# Patient Record
Sex: Male | Born: 1965 | Race: White | Hispanic: No | Smoking: Former smoker
Health system: Southern US, Community
[De-identification: ages and names within clinical notes are randomized; demographics above are authoritative.]

## PROBLEM LIST (undated history)

## (undated) DIAGNOSIS — F419 Anxiety disorder, unspecified: Secondary | ICD-10-CM

## (undated) DIAGNOSIS — E119 Type 2 diabetes mellitus without complications: Secondary | ICD-10-CM

## (undated) DIAGNOSIS — I739 Peripheral vascular disease, unspecified: Secondary | ICD-10-CM

## (undated) DIAGNOSIS — I2510 Atherosclerotic heart disease of native coronary artery without angina pectoris: Secondary | ICD-10-CM

## (undated) DIAGNOSIS — E785 Hyperlipidemia, unspecified: Secondary | ICD-10-CM

## (undated) DIAGNOSIS — M1990 Unspecified osteoarthritis, unspecified site: Secondary | ICD-10-CM

## (undated) DIAGNOSIS — G4730 Sleep apnea, unspecified: Secondary | ICD-10-CM

## (undated) DIAGNOSIS — I209 Angina pectoris, unspecified: Secondary | ICD-10-CM

## (undated) DIAGNOSIS — M25512 Pain in left shoulder: Secondary | ICD-10-CM

## (undated) DIAGNOSIS — N183 Chronic kidney disease, stage 3 (moderate): Secondary | ICD-10-CM

## (undated) DIAGNOSIS — J449 Chronic obstructive pulmonary disease, unspecified: Secondary | ICD-10-CM

## (undated) DIAGNOSIS — I200 Unstable angina: Secondary | ICD-10-CM

## (undated) DIAGNOSIS — R0602 Shortness of breath: Secondary | ICD-10-CM

## (undated) DIAGNOSIS — J45909 Unspecified asthma, uncomplicated: Secondary | ICD-10-CM

## (undated) DIAGNOSIS — M069 Rheumatoid arthritis, unspecified: Secondary | ICD-10-CM

## (undated) HISTORY — DX: Peripheral vascular disease, unspecified: I73.9

## (undated) HISTORY — DX: Hyperlipidemia, unspecified: E78.5

## (undated) HISTORY — PX: CATARACT EXTRACTION, BILATERAL: SHX1313

## (undated) HISTORY — DX: Pain in left shoulder: M25.512

## (undated) HISTORY — PX: EYE SURGERY: SHX253

## (undated) HISTORY — PX: CARPAL TUNNEL RELEASE: SHX101

---

## 2000-01-17 ENCOUNTER — Encounter: Admission: RE | Admit: 2000-01-17 | Discharge: 2000-04-16 | Payer: Self-pay | Admitting: Internal Medicine

## 2002-08-11 ENCOUNTER — Encounter: Payer: Self-pay | Admitting: Internal Medicine

## 2002-08-11 ENCOUNTER — Encounter: Admission: RE | Admit: 2002-08-11 | Discharge: 2002-08-11 | Payer: Self-pay | Admitting: Internal Medicine

## 2004-07-11 ENCOUNTER — Emergency Department (HOSPITAL_COMMUNITY): Admission: EM | Admit: 2004-07-11 | Discharge: 2004-07-11 | Payer: Self-pay | Admitting: Emergency Medicine

## 2007-05-26 ENCOUNTER — Ambulatory Visit (HOSPITAL_BASED_OUTPATIENT_CLINIC_OR_DEPARTMENT_OTHER): Admission: RE | Admit: 2007-05-26 | Discharge: 2007-05-26 | Payer: Self-pay | Admitting: Orthopedic Surgery

## 2007-07-22 ENCOUNTER — Encounter (INDEPENDENT_AMBULATORY_CARE_PROVIDER_SITE_OTHER): Payer: Self-pay | Admitting: Orthopedic Surgery

## 2007-07-22 ENCOUNTER — Ambulatory Visit (HOSPITAL_BASED_OUTPATIENT_CLINIC_OR_DEPARTMENT_OTHER): Admission: RE | Admit: 2007-07-22 | Discharge: 2007-07-22 | Payer: Self-pay | Admitting: Orthopedic Surgery

## 2008-08-10 ENCOUNTER — Encounter: Admission: RE | Admit: 2008-08-10 | Discharge: 2008-08-10 | Payer: Self-pay | Admitting: Internal Medicine

## 2009-06-04 DIAGNOSIS — E785 Hyperlipidemia, unspecified: Secondary | ICD-10-CM | POA: Insufficient documentation

## 2010-12-04 DIAGNOSIS — I10 Essential (primary) hypertension: Secondary | ICD-10-CM | POA: Insufficient documentation

## 2010-12-17 NOTE — Op Note (Signed)
[name], [name]              ACCOUNT NO.:  [id number]   MEDICAL RECORD NO.:  [id number]          PATIENT TYPE:  AMB   LOCATION:  DSC                          FACILITY:  [facility]   PHYSICIAN:  Cindee Salt, M.D.       DATE OF BIRTH:  [birth year]   DATE OF PROCEDURE:  05/26/2007  DATE OF DISCHARGE:  05/26/2007                               OPERATIVE REPORT   PREOPERATIVE DIAGNOSES:  Carpal tunnel syndrome and ulnar neuropathy,  left hand and left elbow.   POSTOPERATIVE DIAGNOSES:  Carpal tunnel syndrome and ulnar neuropathy,  left hand and left elbow.   OPERATION:  1. Decompression left median nerve.  2. Subcutaneous transposition of left ulnar artery at the elbow.   SURGEON:  Cindee Salt, M.D.   ASSISTANT:  __________ R.N.   ANESTHESIA:  General.   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  This has not  responded to conservative treatment.  He has elected to undergo surgical  decompression of this.  He has a history of diabetes and probable  peripheral neuropathy.  He is aware of risks and complications including  the possibility of infection, recurrence, injury to arteries, nerves,  tendons, incomplete relief of symptoms and dystrophy.  He is aware that  this may not entirely resolve all of his symptoms secondary to the  underlying peripheral neuropathy.  His questions have been encouraged  and answered.  In the preoperative area, the patient is seen, the  extremity marked by both patient and surgeon, questions again encouraged  and answered for him and his family.  Antibiotic given.   PROCEDURE:  The patient is brought to the operating room where a general  anesthetic was carried out without difficulty.  He was prepped using  DuraPrep in supine position, left arm free.  A longitudinal incision was  made in the palm,  carried down through subcutaneous tissue.  Bleeders  were electrocauterized.  Palmar fascia was split.  Superficial palmar  arch identified.  Flexor tendon of the ring and little finger identified  to the ulnar side of the median nerve.  The carpal retinaculum was  incised with sharp dissection.  Significant scarring and thickening of  the palmar fascia was immediately apparent as was the carpal  retinaculum.  A right-angle and Sewall retractor were placed between  skin and forearm, and the forearm fascia released for approximately 2 cm  proximal to the wrist crease under direct vision.  The canal was  explored.  Area of compression of the nerve was apparent.  No further  lesions were identified.  The wound was irrigated and closed with  interrupted 5-0 Vicryl Rapide sutures.   A separate incision was then made over the medial aspect of the elbow,  carried down through subcutaneous tissue.  Bleeders again  electrocauterized.  The posterior branch of the medial antebrachial  cutaneous nerve of the forearm was identified and protected.  Dissection  was carried down to the ulnar  nerve.  Significant scarring was present  and fibrosis about the nerve was apparent.  This was dissected free and  neurolysis performed.  A fasciotomy of the flexor carpi ulnaris  performed.  The dissection carried up to the arcade of Struthers.  The  elbow was placed into full flexion, and the elbow was noted to have the  ulnar nerve immediately dislocate around the medial epicondyle.  It was  decided to proceed with a subcutaneous transposition.  The medial  intermuscular septum was then harvested leaving an attached posterior  aspect of the epicondyle.  The fascia of the flexor carpi ulnaris was  then elevated to provide a secondary flap distally.  The nerve was then  anteriorly transposed along with its blood vessels.  Two slings were  then created and sutured into position with interrupted 4-0 Vicryl  sutures  after irrigation.  The arm was placed through full flexion, full  extension.  No further subluxation was noted.  The subcutaneous tissue  was then closed with interrupted 4-0 Vicryl sutures and the skin with  interrupted 4-0 Vicryl Rapide sutures.  A sterile compressive dressing  and long-arm splint with the elbow in 90 degrees of flexion was applied.  On deflation of the tourniquet, all fingers immediately pinked.  He was  taken to the recovery room for observation in satisfactory condition.   He was discharged home to return to the [facility] of [place] in 1  week on Vicodin.           ______________________________  Cindee Salt, M.D.     GK/MEDQ  D:  05/26/2007  T:  05/27/2007  Job:  [id number]   cc:   Cindee Salt, M.D.  Ivery Quale, M.D.

## 2010-12-17 NOTE — Op Note (Signed)
NAMEBRADAN, [name]              ACCOUNT NO.:  [id number]   MEDICAL RECORD NO.:  [id number]          PATIENT TYPE:  AMB   LOCATION:  DSC                          FACILITY:  [facility]   PHYSICIAN:  Cindee Salt, M.D.       DATE OF BIRTH:  [birth year]   DATE OF PROCEDURE:  07/22/2007  DATE OF DISCHARGE:                               OPERATIVE REPORT   PREOPERATIVE DIAGNOSIS:  Carpal tunnel syndrome, ulnar neuropathy, right  wrist and elbow.   POSTOPERATIVE DIAGNOSIS:  Carpal tunnel syndrome, ulnar neuropathy,  right wrist and elbow.   OPERATION:  Decompression median nerve, subcutaneous transposition ulnar  nerve, right elbow, right hand.   SURGEON:  Merlyn Lot, M.D.   ANESTHESIA:  General.   HISTORY:  The patient is a 45 year old male with a history of diabetes,  median and ulnar neuropathy at the wrist and elbow.  He has undergone  release on his left side, is admitted now for release of his right with  possible transposition.  He is aware of risks and complications  including infection, recurrence, injury to arteries, nerves, tendons,  incomplete relief of symptoms, dystrophy.  Questions have been  encouraged and answered.   In the preoperative area, the patient was seen, the extremity marked by  both the patient and surgeon.  Antibiotic given.   PROCEDURE:  The patient was brought to the operating room where a  general anesthetic LMA was given without difficulty.  Was prepped using  DuraPrep, supine position, right arm free.  The limb was exsanguinated  with an Esmarch bandage.  Tourniquet placed high on the arm was inflated  to 150 mmHg.  Straight incision was made in the palm, carried down  through subcutaneous tissue.  Bleeders were electrocauterized.  Palmar  fascia was split.  Superficial palmar arch identified.  The median nerve  was identified.  A thickening of the palmar fascia was noted, indicative  of what appeared to be either a fibroma or Dupuytren's contracture.  This  was sent to pathology.  The median nerve was released at the ulnar  side of the carpal retinaculum.  Right angle and Sewall retractor were  placed between skin and forearm fascia.  Forearm fascia was released  approximately a centimeter and a half proximal to the wrist crease under  direct vision.  Canal was explored.  Thickening of the tenosynovium  tissue was noted.  No further lesions were identified.  The wound was  irrigated and closed interrupted 5-0 Vicryl Rapide sutures.  A separate  stab incision was then made over the medial epicondyle of the right  elbow, carried down through subcutaneous tissue.  Bleeders again  electrocauterized.  Dissection carried down to the medial musculature  epicondyle.  The posterior branch of the medial antebrachial cutaneous  nerve of the forearm identified and protected.  The dissection was then  carried down to the ulnar nerve.  This was identified proximally and  distally.  Significant scarring was present about this with thickening  of the epineurium.  A fasciotomy of the flexor carpi ulnaris was then  performed.  The medial intermuscular  septum was then dissected free up  to the level of the arcade of Struthers.  The medial intermuscular  septum was left attached distally to the most medial aspect of the  epicondyle.  A portion of fascia of the origin of the flexor carpi  ulnaris was then elevated.  This was also left attached to the medial  epicondyle.  The ulnar nerve was then anteriorly transposed along with  its __________ anteriorly.  The two portions of medial intermuscular  septum and the fascia of the flexor carpi ulnaris were then used to  create two slings.  These were then sutured to the subcutaneous tissue  with figure-of-eight 2-0 Vicryl sutures after irrigation.  The arm was  placed through a full flexion, full extension.  No subluxation was  noted.  It was found prior to the transposition that the ulnar nerve did  subluxate  anteriorly following decompression.  The wound was again  irrigated.  The subcutaneous tissue closed with interrupted 2-0 Vicryl  and skin with interrupted 5-0 Vicryl Rapide sutures.  Sterile  compressive dressing and long-arm splint applied.  The patient tolerated  the procedure well and was taken to the recovery room for observation in  satisfactory condition.  He will be discharged home to return to the  [facility] of [place] in 1 week on Vicodin.           ______________________________  Cindee Salt, M.D.     GK/MEDQ  D:  07/22/2007  T:  07/23/2007  Job:  [id number]   cc:   Cindee Salt, M.D.

## 2011-05-09 LAB — BASIC METABOLIC PANEL
BUN: 16
CO2: 29
Calcium: 9.3
Chloride: 103
Creatinine, Ser: 1.58 — ABNORMAL HIGH
GFR calc Af Amer: 58 — ABNORMAL LOW
GFR calc non Af Amer: 48 — ABNORMAL LOW
Glucose, Bld: 48 — ABNORMAL LOW
Potassium: 4.6
Sodium: 139

## 2011-05-09 LAB — POCT HEMOGLOBIN-HEMACUE
Hemoglobin: 15.3
Operator id: 128471

## 2011-05-14 LAB — BASIC METABOLIC PANEL
BUN: 13
CO2: 29
Calcium: 9.3
Chloride: 102
Creatinine, Ser: 1.5
GFR calc Af Amer: >60
GFR calc non Af Amer: 51 — ABNORMAL LOW
Glucose, Bld: 101 — ABNORMAL HIGH
Potassium: 4.1
Sodium: 137

## 2011-05-14 LAB — POCT HEMOGLOBIN-HEMACUE
Hemoglobin: 14.3
Operator id: 116011

## 2011-06-16 DIAGNOSIS — H4310 Vitreous hemorrhage, unspecified eye: Secondary | ICD-10-CM | POA: Insufficient documentation

## 2011-12-19 DIAGNOSIS — H2513 Age-related nuclear cataract, bilateral: Secondary | ICD-10-CM | POA: Insufficient documentation

## 2012-12-13 ENCOUNTER — Encounter (INDEPENDENT_AMBULATORY_CARE_PROVIDER_SITE_OTHER): Payer: 59 | Admitting: *Deleted

## 2012-12-13 DIAGNOSIS — M79609 Pain in unspecified limb: Secondary | ICD-10-CM

## 2013-03-18 ENCOUNTER — Encounter (HOSPITAL_COMMUNITY): Payer: Self-pay | Admitting: Emergency Medicine

## 2013-03-18 ENCOUNTER — Emergency Department (HOSPITAL_COMMUNITY)
Admission: EM | Admit: 2013-03-18 | Discharge: 2013-03-18 | Disposition: A | Discharge status: Home / Self Care | Payer: Worker's Compensation | Attending: Emergency Medicine | Admitting: Emergency Medicine

## 2013-03-18 DIAGNOSIS — Z7982 Long term (current) use of aspirin: Secondary | ICD-10-CM | POA: Insufficient documentation

## 2013-03-18 DIAGNOSIS — F17200 Nicotine dependence, unspecified, uncomplicated: Secondary | ICD-10-CM | POA: Insufficient documentation

## 2013-03-18 DIAGNOSIS — E119 Type 2 diabetes mellitus without complications: Secondary | ICD-10-CM | POA: Insufficient documentation

## 2013-03-18 DIAGNOSIS — Y939 Activity, unspecified: Secondary | ICD-10-CM | POA: Insufficient documentation

## 2013-03-18 DIAGNOSIS — IMO0002 Reserved for concepts with insufficient information to code with codable children: Secondary | ICD-10-CM | POA: Insufficient documentation

## 2013-03-18 DIAGNOSIS — Z79899 Other long term (current) drug therapy: Secondary | ICD-10-CM | POA: Insufficient documentation

## 2013-03-18 DIAGNOSIS — Z794 Long term (current) use of insulin: Secondary | ICD-10-CM | POA: Insufficient documentation

## 2013-03-18 DIAGNOSIS — S0993XA Unspecified injury of face, initial encounter: Secondary | ICD-10-CM | POA: Insufficient documentation

## 2013-03-18 DIAGNOSIS — Y92410 Unspecified street and highway as the place of occurrence of the external cause: Secondary | ICD-10-CM | POA: Insufficient documentation

## 2013-03-18 HISTORY — DX: Type 2 diabetes mellitus without complications: E11.9

## 2013-03-18 MED ORDER — CYCLOBENZAPRINE HCL 10 MG PO TABS: 10.0000 mg | ORAL_TABLET | Freq: Two times a day (BID) | ORAL | Status: DC | PRN

## 2013-03-18 MED ORDER — HYDROCODONE-ACETAMINOPHEN 5-325 MG PO TABS: 1.0000 | ORAL_TABLET | ORAL | Status: DC | PRN
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
## 2013-03-18 NOTE — ED Provider Notes (Signed)
Medical screening examination/treatment/procedure(s) were performed by non-physician practitioner and as supervising physician I was immediately available for consultation/collaboration.  Raeford Razor, MD 03/18/13 1600
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
## 2013-04-19 DIAGNOSIS — J45909 Unspecified asthma, uncomplicated: Secondary | ICD-10-CM | POA: Insufficient documentation

## 2013-12-19 DIAGNOSIS — L709 Acne, unspecified: Secondary | ICD-10-CM | POA: Insufficient documentation

## 2013-12-19 DIAGNOSIS — Z79899 Other long term (current) drug therapy: Secondary | ICD-10-CM | POA: Insufficient documentation

## 2014-01-04 DIAGNOSIS — N529 Male erectile dysfunction, unspecified: Secondary | ICD-10-CM | POA: Insufficient documentation

## 2014-04-07 DIAGNOSIS — R072 Precordial pain: Secondary | ICD-10-CM | POA: Insufficient documentation

## 2014-04-25 ENCOUNTER — Encounter (HOSPITAL_COMMUNITY)
Admission: AD | Disposition: A | Discharge status: Home / Self Care | Payer: Self-pay | Source: Ambulatory Visit | Attending: Cardiothoracic Surgery

## 2014-04-25 ENCOUNTER — Ambulatory Visit (HOSPITAL_COMMUNITY): Payer: 59

## 2014-04-25 ENCOUNTER — Inpatient Hospital Stay (HOSPITAL_COMMUNITY): Payer: 59

## 2014-04-25 ENCOUNTER — Inpatient Hospital Stay (HOSPITAL_COMMUNITY)
Admission: AD | Admit: 2014-04-25 | Discharge: 2014-05-06 | DRG: 234 | Disposition: A | Discharge status: Home / Self Care | Payer: 59 | Source: Ambulatory Visit | Attending: Cardiothoracic Surgery | Admitting: Cardiothoracic Surgery

## 2014-04-25 ENCOUNTER — Other Ambulatory Visit: Payer: Self-pay

## 2014-04-25 DIAGNOSIS — I2582 Chronic total occlusion of coronary artery: Secondary | ICD-10-CM | POA: Diagnosis present

## 2014-04-25 DIAGNOSIS — E785 Hyperlipidemia, unspecified: Secondary | ICD-10-CM | POA: Diagnosis present

## 2014-04-25 DIAGNOSIS — I2510 Atherosclerotic heart disease of native coronary artery without angina pectoris: Secondary | ICD-10-CM | POA: Diagnosis present

## 2014-04-25 DIAGNOSIS — R079 Chest pain, unspecified: Secondary | ICD-10-CM | POA: Diagnosis present

## 2014-04-25 DIAGNOSIS — Z7982 Long term (current) use of aspirin: Secondary | ICD-10-CM | POA: Diagnosis not present

## 2014-04-25 DIAGNOSIS — I129 Hypertensive chronic kidney disease with stage 1 through stage 4 chronic kidney disease, or unspecified chronic kidney disease: Secondary | ICD-10-CM | POA: Diagnosis present

## 2014-04-25 DIAGNOSIS — E1122 Type 2 diabetes mellitus with diabetic chronic kidney disease: Secondary | ICD-10-CM | POA: Diagnosis present

## 2014-04-25 DIAGNOSIS — J939 Pneumothorax, unspecified: Secondary | ICD-10-CM | POA: Diagnosis present

## 2014-04-25 DIAGNOSIS — F17210 Nicotine dependence, cigarettes, uncomplicated: Secondary | ICD-10-CM | POA: Diagnosis present

## 2014-04-25 DIAGNOSIS — Z794 Long term (current) use of insulin: Secondary | ICD-10-CM | POA: Diagnosis not present

## 2014-04-25 DIAGNOSIS — E871 Hypo-osmolality and hyponatremia: Secondary | ICD-10-CM | POA: Diagnosis present

## 2014-04-25 DIAGNOSIS — K5900 Constipation, unspecified: Secondary | ICD-10-CM | POA: Diagnosis not present

## 2014-04-25 DIAGNOSIS — I200 Unstable angina: Secondary | ICD-10-CM

## 2014-04-25 DIAGNOSIS — J9811 Atelectasis: Secondary | ICD-10-CM | POA: Diagnosis not present

## 2014-04-25 DIAGNOSIS — N179 Acute kidney failure, unspecified: Secondary | ICD-10-CM | POA: Diagnosis not present

## 2014-04-25 DIAGNOSIS — I2584 Coronary atherosclerosis due to calcified coronary lesion: Secondary | ICD-10-CM | POA: Diagnosis present

## 2014-04-25 DIAGNOSIS — M069 Rheumatoid arthritis, unspecified: Secondary | ICD-10-CM | POA: Diagnosis present

## 2014-04-25 DIAGNOSIS — I25110 Atherosclerotic heart disease of native coronary artery with unstable angina pectoris: Principal | ICD-10-CM | POA: Diagnosis present

## 2014-04-25 DIAGNOSIS — D62 Acute posthemorrhagic anemia: Secondary | ICD-10-CM | POA: Diagnosis not present

## 2014-04-25 DIAGNOSIS — N183 Chronic kidney disease, stage 3 unspecified: Secondary | ICD-10-CM | POA: Diagnosis present

## 2014-04-25 DIAGNOSIS — Z79899 Other long term (current) drug therapy: Secondary | ICD-10-CM

## 2014-04-25 DIAGNOSIS — T501X5A Adverse effect of loop [high-ceiling] diuretics, initial encounter: Secondary | ICD-10-CM | POA: Diagnosis present

## 2014-04-25 DIAGNOSIS — I319 Disease of pericardium, unspecified: Secondary | ICD-10-CM | POA: Diagnosis not present

## 2014-04-25 DIAGNOSIS — Z951 Presence of aortocoronary bypass graft: Secondary | ICD-10-CM

## 2014-04-25 DIAGNOSIS — N990 Postprocedural (acute) (chronic) kidney failure: Secondary | ICD-10-CM | POA: Diagnosis not present

## 2014-04-25 DIAGNOSIS — E119 Type 2 diabetes mellitus without complications: Secondary | ICD-10-CM

## 2014-04-25 DIAGNOSIS — Z01810 Encounter for preprocedural cardiovascular examination: Secondary | ICD-10-CM

## 2014-04-25 DIAGNOSIS — J90 Pleural effusion, not elsewhere classified: Secondary | ICD-10-CM | POA: Diagnosis present

## 2014-04-25 HISTORY — DX: Unspecified asthma, uncomplicated: J45.909

## 2014-04-25 HISTORY — PX: LEFT HEART CATHETERIZATION WITH CORONARY ANGIOGRAM: SHX5451

## 2014-04-25 HISTORY — DX: Angina pectoris, unspecified: I20.9

## 2014-04-25 HISTORY — DX: Type 2 diabetes mellitus without complications: E11.9

## 2014-04-25 HISTORY — DX: Unspecified osteoarthritis, unspecified site: M19.90

## 2014-04-25 HISTORY — PX: CARDIAC CATHETERIZATION: SHX172

## 2014-04-25 HISTORY — DX: Rheumatoid arthritis, unspecified: M06.9

## 2014-04-25 HISTORY — DX: Chronic kidney disease, stage 3 (moderate): N18.3

## 2014-04-25 HISTORY — DX: Shortness of breath: R06.02

## 2014-04-25 HISTORY — DX: Atherosclerotic heart disease of native coronary artery without angina pectoris: I25.10

## 2014-04-25 HISTORY — DX: Unstable angina: I20.0

## 2014-04-25 LAB — TYPE AND SCREEN
ABO/RH(D): A POS
Antibody Screen: NEGATIVE

## 2014-04-25 LAB — APTT: aPTT: 30 seconds (ref 24–37)

## 2014-04-25 LAB — PULMONARY FUNCTION TEST
FEF 25-75 Post: 1.45 L/sec
FEF 25-75 Pre: 1.7 L/sec
FEF2575-%Change-Post: -14 %
FEF2575-%Pred-Post: 44 %
FEF2575-%Pred-Pre: 51 %
FEV1-%Change-Post: -2 %
FEV1-%Pred-Post: 56 %
FEV1-%Pred-Pre: 58 %
FEV1-Post: 2.08 L
FEV1-Pre: 2.14 L
FEV1FVC-%Change-Post: 3 %
FEV1FVC-%Pred-Pre: 96 %
FEV6-%Change-Post: -6 %
FEV6-%Pred-Post: 57 %
FEV6-%Pred-Pre: 61 %
FEV6-Post: 2.63 L
FEV6-Pre: 2.81 L
FEV6FVC-%Change-Post: 0 %
FEV6FVC-%Pred-Post: 103 %
FEV6FVC-%Pred-Pre: 103 %
FVC-%Change-Post: -6 %
FVC-%Pred-Post: 56 %
FVC-%Pred-Pre: 59 %
FVC-Post: 2.65 L
FVC-Pre: 2.83 L
Post FEV1/FVC ratio: 79 %
Post FEV6/FVC ratio: 99 %
Pre FEV1/FVC ratio: 76 %
Pre FEV6/FVC Ratio: 99 %

## 2014-04-25 LAB — COMPREHENSIVE METABOLIC PANEL
ALT: 34 U/L (ref 0–53)
AST: 34 U/L (ref 0–37)
Albumin: 3.8 g/dL (ref 3.5–5.2)
Alkaline Phosphatase: 107 U/L (ref 39–117)
Anion gap: 10 (ref 5–15)
BUN: 14 mg/dL (ref 6–23)
CO2: 24 mEq/L (ref 19–32)
Calcium: 8.9 mg/dL (ref 8.4–10.5)
Chloride: 103 mEq/L (ref 96–112)
Creatinine, Ser: 1.51 mg/dL — ABNORMAL HIGH (ref 0.50–1.35)
GFR calc Af Amer: 61 mL/min — ABNORMAL LOW (ref >90)
GFR calc non Af Amer: 52 mL/min — ABNORMAL LOW (ref >90)
Glucose, Bld: 170 mg/dL — ABNORMAL HIGH (ref 70–99)
Potassium: 4.5 mEq/L (ref 3.7–5.3)
Sodium: 137 mEq/L (ref 137–147)
Total Bilirubin: 0.2 mg/dL — ABNORMAL LOW (ref 0.3–1.2)
Total Protein: 7.4 g/dL (ref 6.0–8.3)

## 2014-04-25 LAB — PROTIME-INR
INR: 1.01 (ref 0.00–1.49)
INR: 0.99 (ref 0.00–1.49)
Prothrombin Time: 13.3 seconds (ref 11.6–15.2)
Prothrombin Time: 13.1 seconds (ref 11.6–15.2)

## 2014-04-25 LAB — BASIC METABOLIC PANEL
Anion gap: 8 (ref 5–15)
BUN: 14 mg/dL (ref 6–23)
CO2: 29 mEq/L (ref 19–32)
Calcium: 8.6 mg/dL (ref 8.4–10.5)
Chloride: 102 mEq/L (ref 96–112)
Creatinine, Ser: 1.57 mg/dL — ABNORMAL HIGH (ref 0.50–1.35)
GFR calc Af Amer: 58 mL/min — ABNORMAL LOW (ref >90)
GFR calc non Af Amer: 50 mL/min — ABNORMAL LOW (ref >90)
Glucose, Bld: 184 mg/dL — ABNORMAL HIGH (ref 70–99)
Potassium: 4.3 mEq/L (ref 3.7–5.3)
Sodium: 139 mEq/L (ref 137–147)

## 2014-04-25 LAB — CBC
HCT: 37.1 % — ABNORMAL LOW (ref 39.0–52.0)
Hemoglobin: 12.3 g/dL — ABNORMAL LOW (ref 13.0–17.0)
MCH: 31.1 pg (ref 26.0–34.0)
MCHC: 33.2 g/dL (ref 30.0–36.0)
MCV: 93.9 fL (ref 78.0–100.0)
Platelets: 210 10*3/uL (ref 150–400)
RBC: 3.95 MIL/uL — ABNORMAL LOW (ref 4.22–5.81)
RDW: 13.3 % (ref 11.5–15.5)
WBC: 12.1 10*3/uL — ABNORMAL HIGH (ref 4.0–10.5)

## 2014-04-25 LAB — BLOOD GAS, ARTERIAL
Acid-Base Excess: 1.3 mmol/L (ref 0.0–2.0)
Bicarbonate: 25.3 mEq/L — ABNORMAL HIGH (ref 20.0–24.0)
Drawn by: 232811
FIO2: 0.21 %
O2 Saturation: 95.9 %
Patient temperature: 98.8
TCO2: 26.4 mmol/L (ref 0–100)
pCO2 arterial: 39.2 mmHg (ref 35.0–45.0)
pH, Arterial: 7.426 (ref 7.350–7.450)
pO2, Arterial: 76.5 mmHg — ABNORMAL LOW (ref 80.0–100.0)

## 2014-04-25 LAB — URINALYSIS, ROUTINE W REFLEX MICROSCOPIC
Bilirubin Urine: NEGATIVE
Glucose, UA: NEGATIVE mg/dL
Hgb urine dipstick: NEGATIVE
Ketones, ur: NEGATIVE mg/dL
Leukocytes, UA: NEGATIVE
Nitrite: NEGATIVE
Protein, ur: NEGATIVE mg/dL
Specific Gravity, Urine: 1.024 (ref 1.005–1.030)
Urobilinogen, UA: 0.2 mg/dL (ref 0.0–1.0)
pH: 7 (ref 5.0–8.0)

## 2014-04-25 LAB — GLUCOSE, CAPILLARY
Glucose-Capillary: 283 mg/dL — ABNORMAL HIGH (ref 70–99)
Glucose-Capillary: 224 mg/dL — ABNORMAL HIGH (ref 70–99)
Glucose-Capillary: 170 mg/dL — ABNORMAL HIGH (ref 70–99)
Glucose-Capillary: 197 mg/dL — ABNORMAL HIGH (ref 70–99)

## 2014-04-25 LAB — SURGICAL PCR SCREEN
MRSA, PCR: NEGATIVE
Staphylococcus aureus: POSITIVE — AB

## 2014-04-25 LAB — HEMOGLOBIN A1C
Hgb A1c MFr Bld: 7.4 % — ABNORMAL HIGH (ref <5.7)
Mean Plasma Glucose: 166 mg/dL — ABNORMAL HIGH (ref <117)

## 2014-04-25 LAB — ABO/RH: ABO/RH(D): A POS

## 2014-04-25 SURGERY — LEFT HEART CATHETERIZATION WITH CORONARY ANGIOGRAM
Anesthesia: LOCAL

## 2014-04-25 MED ORDER — SODIUM CHLORIDE 0.9 % IJ SOLN: 3.0000 mL | INTRAMUSCULAR | Status: DC | PRN

## 2014-04-25 MED ORDER — CHLORHEXIDINE GLUCONATE 4 % EX LIQD
60.0000 mL | Freq: Once | CUTANEOUS | Status: DC
  Filled 2014-04-25: qty 60

## 2014-04-25 MED ORDER — ATORVASTATIN CALCIUM 40 MG PO TABS
40.0000 mg | ORAL_TABLET | Freq: Every day | ORAL | Status: DC
  Administered 2014-04-26 – 2014-05-06 (×10): 40 mg via ORAL
  Filled 2014-04-25 (×12): qty 1

## 2014-04-25 MED ORDER — NITROGLYCERIN IN D5W 200-5 MCG/ML-% IV SOLN
INTRAVENOUS | Status: AC
  Filled 2014-04-25: qty 250

## 2014-04-25 MED ORDER — NITROGLYCERIN 1 MG/10 ML FOR IR/CATH LAB
INTRA_ARTERIAL | Status: AC
  Filled 2014-04-25: qty 10

## 2014-04-25 MED ORDER — ACETAMINOPHEN 325 MG PO TABS: 650.0000 mg | ORAL_TABLET | ORAL | Status: DC | PRN

## 2014-04-25 MED ORDER — SODIUM CHLORIDE 0.9 % IV SOLN
INTRAVENOUS | Status: DC
  Filled 2014-04-25: qty 40

## 2014-04-25 MED ORDER — MAGNESIUM SULFATE 50 % IJ SOLN
40.0000 meq | INTRAMUSCULAR | Status: DC
  Filled 2014-04-25: qty 10

## 2014-04-25 MED ORDER — ASPIRIN EC 81 MG PO TBEC
81.0000 mg | DELAYED_RELEASE_TABLET | Freq: Every day | ORAL | Status: DC
  Administered 2014-04-26: 81 mg via ORAL
  Filled 2014-04-25 (×3): qty 1

## 2014-04-25 MED ORDER — CHLORHEXIDINE GLUCONATE CLOTH 2 % EX PADS: 6.0000 | MEDICATED_PAD | Freq: Every day | CUTANEOUS | Status: DC

## 2014-04-25 MED ORDER — HEPARIN (PORCINE) IN NACL 100-0.45 UNIT/ML-% IJ SOLN
1350.0000 [IU]/h | INTRAMUSCULAR | Status: DC
  Administered 2014-04-25: 1050 [IU]/h via INTRAVENOUS
  Filled 2014-04-25 (×4): qty 250

## 2014-04-25 MED ORDER — PLASMA-LYTE 148 IV SOLN
INTRAVENOUS | Status: DC
  Filled 2014-04-25: qty 2.5

## 2014-04-25 MED ORDER — SODIUM CHLORIDE 0.9 % IV SOLN: 250.0000 mL | INTRAVENOUS | Status: DC | PRN

## 2014-04-25 MED ORDER — INSULIN GLARGINE 100 UNIT/ML ~~LOC~~ SOLN
36.0000 [IU] | Freq: Every day | SUBCUTANEOUS | Status: DC
  Filled 2014-04-25: qty 0.36

## 2014-04-25 MED ORDER — SODIUM CHLORIDE 0.9 % IV SOLN
250.0000 mL | INTRAVENOUS | Status: DC | PRN
  Administered 2014-04-26: 250 mL via INTRAVENOUS

## 2014-04-25 MED ORDER — LIDOCAINE HCL (PF) 1 % IJ SOLN
INTRAMUSCULAR | Status: AC
  Filled 2014-04-25: qty 30

## 2014-04-25 MED ORDER — PHENYLEPHRINE HCL 10 MG/ML IJ SOLN
30.0000 ug/min | INTRAVENOUS | Status: DC
  Filled 2014-04-25: qty 2

## 2014-04-25 MED ORDER — METOPROLOL TARTRATE 25 MG PO TABS
25.0000 mg | ORAL_TABLET | Freq: Two times a day (BID) | ORAL | Status: DC
  Administered 2014-04-25 – 2014-04-26 (×3): 25 mg via ORAL
  Filled 2014-04-25 (×6): qty 1

## 2014-04-25 MED ORDER — EPINEPHRINE HCL 1 MG/ML IJ SOLN
0.5000 ug/min | INTRAVENOUS | Status: DC
  Filled 2014-04-25: qty 4

## 2014-04-25 MED ORDER — ALPRAZOLAM 0.25 MG PO TABS: 0.2500 mg | ORAL_TABLET | ORAL | Status: DC | PRN

## 2014-04-25 MED ORDER — CYCLOBENZAPRINE HCL 10 MG PO TABS
10.0000 mg | ORAL_TABLET | Freq: Two times a day (BID) | ORAL | Status: DC | PRN
  Filled 2014-04-25: qty 1

## 2014-04-25 MED ORDER — DEXTROSE 5 % IV SOLN
750.0000 mg | INTRAVENOUS | Status: DC
  Filled 2014-04-25: qty 750

## 2014-04-25 MED ORDER — SODIUM CHLORIDE 0.9 % IJ SOLN
3.0000 mL | Freq: Two times a day (BID) | INTRAMUSCULAR | Status: DC
  Administered 2014-04-26: 3 mL via INTRAVENOUS

## 2014-04-25 MED ORDER — SODIUM CHLORIDE 0.9 % IV SOLN
INTRAVENOUS | Status: DC
  Filled 2014-04-25: qty 2.5

## 2014-04-25 MED ORDER — HEPARIN (PORCINE) IN NACL 2-0.9 UNIT/ML-% IJ SOLN
INTRAMUSCULAR | Status: AC
  Filled 2014-04-25: qty 1000

## 2014-04-25 MED ORDER — TIOTROPIUM BROMIDE MONOHYDRATE 18 MCG IN CAPS
18.0000 ug | ORAL_CAPSULE | Freq: Every day | RESPIRATORY_TRACT | Status: DC
  Administered 2014-04-26 – 2014-05-06 (×9): 18 ug via RESPIRATORY_TRACT
  Filled 2014-04-25 (×2): qty 5

## 2014-04-25 MED ORDER — NITROGLYCERIN IN D5W 200-5 MCG/ML-% IV SOLN
2.0000 ug/min | INTRAVENOUS | Status: DC
  Filled 2014-04-25 (×2): qty 250

## 2014-04-25 MED ORDER — ALBUTEROL SULFATE (2.5 MG/3ML) 0.083% IN NEBU
2.5000 mg | INHALATION_SOLUTION | Freq: Once | RESPIRATORY_TRACT | Status: AC
  Administered 2014-04-25: 2.5 mg via RESPIRATORY_TRACT

## 2014-04-25 MED ORDER — POTASSIUM CHLORIDE 2 MEQ/ML IV SOLN
80.0000 meq | INTRAVENOUS | Status: DC
  Filled 2014-04-25: qty 40

## 2014-04-25 MED ORDER — SODIUM CHLORIDE 0.9 % IJ SOLN: 3.0000 mL | Freq: Two times a day (BID) | INTRAMUSCULAR | Status: DC

## 2014-04-25 MED ORDER — ONDANSETRON HCL 4 MG/2ML IJ SOLN: 4.0000 mg | Freq: Four times a day (QID) | INTRAMUSCULAR | Status: DC | PRN

## 2014-04-25 MED ORDER — CHLORHEXIDINE GLUCONATE 4 % EX LIQD
60.0000 mL | Freq: Once | CUTANEOUS | Status: AC
  Administered 2014-04-25: 4 via TOPICAL
  Filled 2014-04-25: qty 60

## 2014-04-25 MED ORDER — DOPAMINE-DEXTROSE 3.2-5 MG/ML-% IV SOLN
2.0000 ug/kg/min | INTRAVENOUS | Status: DC
  Filled 2014-04-25: qty 250

## 2014-04-25 MED ORDER — INSULIN GLARGINE 100 UNIT/ML ~~LOC~~ SOLN
37.0000 [IU] | Freq: Every day | SUBCUTANEOUS | Status: DC
  Administered 2014-04-25 – 2014-04-26 (×2): 37 [IU] via SUBCUTANEOUS
  Filled 2014-04-25 (×3): qty 0.37

## 2014-04-25 MED ORDER — INSULIN ASPART 100 UNIT/ML ~~LOC~~ SOLN
0.0000 [IU] | Freq: Three times a day (TID) | SUBCUTANEOUS | Status: DC
  Administered 2014-04-25: 3 [IU] via SUBCUTANEOUS
  Administered 2014-04-25 – 2014-04-26 (×2): 5 [IU] via SUBCUTANEOUS
  Administered 2014-04-26: 3 [IU] via SUBCUTANEOUS

## 2014-04-25 MED ORDER — ASPIRIN 81 MG PO CHEW
CHEWABLE_TABLET | ORAL | Status: AC
  Filled 2014-04-25: qty 1

## 2014-04-25 MED ORDER — ASPIRIN 81 MG PO CHEW
81.0000 mg | CHEWABLE_TABLET | ORAL | Status: AC
  Administered 2014-04-25: 81 mg via ORAL

## 2014-04-25 MED ORDER — MIDAZOLAM HCL 2 MG/2ML IJ SOLN
INTRAMUSCULAR | Status: AC
  Filled 2014-04-25: qty 2

## 2014-04-25 MED ORDER — VANCOMYCIN HCL 10 G IV SOLR
1500.0000 mg | INTRAVENOUS | Status: DC
  Filled 2014-04-25: qty 1500

## 2014-04-25 MED ORDER — DEXTROSE 5 % IV SOLN
1.5000 g | INTRAVENOUS | Status: DC
  Filled 2014-04-25: qty 1.5

## 2014-04-25 MED ORDER — SODIUM CHLORIDE 0.9 % IV SOLN: 1.0000 mL/kg/h | INTRAVENOUS | Status: AC

## 2014-04-25 MED ORDER — HYDROCODONE-ACETAMINOPHEN 5-325 MG PO TABS: 1.0000 | ORAL_TABLET | ORAL | Status: DC | PRN

## 2014-04-25 MED ORDER — VERAPAMIL HCL 2.5 MG/ML IV SOLN
INTRAVENOUS | Status: AC
  Filled 2014-04-25: qty 2

## 2014-04-25 MED ORDER — HEPARIN SODIUM (PORCINE) 1000 UNIT/ML IJ SOLN
INTRAMUSCULAR | Status: AC
  Filled 2014-04-25: qty 1

## 2014-04-25 MED ORDER — HEPARIN (PORCINE) IN NACL 2-0.9 UNIT/ML-% IJ SOLN
INTRAMUSCULAR | Status: AC
  Filled 2014-04-25: qty 500

## 2014-04-25 MED ORDER — DEXMEDETOMIDINE HCL IN NACL 400 MCG/100ML IV SOLN
0.1000 ug/kg/h | INTRAVENOUS | Status: DC
  Filled 2014-04-25: qty 100

## 2014-04-25 MED ORDER — TEMAZEPAM 15 MG PO CAPS
15.0000 mg | ORAL_CAPSULE | Freq: Once | ORAL | Status: AC | PRN
Start: 2014-04-25 — End: 2014-04-25

## 2014-04-25 MED ORDER — SODIUM CHLORIDE 0.9 % IV SOLN
INTRAVENOUS | Status: DC
  Filled 2014-04-25: qty 30

## 2014-04-25 MED ORDER — SODIUM CHLORIDE 0.9 % IV SOLN
INTRAVENOUS | Status: DC
  Administered 2014-04-25: 09:00:00 via INTRAVENOUS

## 2014-04-25 MED ORDER — BISACODYL 5 MG PO TBEC
5.0000 mg | DELAYED_RELEASE_TABLET | Freq: Once | ORAL | Status: AC
  Administered 2014-04-25: 5 mg via ORAL
  Filled 2014-04-25: qty 1

## 2014-04-25 MED ORDER — INSULIN ASPART 100 UNIT/ML ~~LOC~~ SOLN
4.0000 [IU] | Freq: Three times a day (TID) | SUBCUTANEOUS | Status: DC
  Administered 2014-04-25 – 2014-04-26 (×4): 4 [IU] via SUBCUTANEOUS

## 2014-04-25 MED ORDER — LOSARTAN POTASSIUM 25 MG PO TABS
25.0000 mg | ORAL_TABLET | Freq: Every day | ORAL | Status: DC
  Filled 2014-04-25 (×2): qty 1

## 2014-04-25 MED ORDER — HYDROMORPHONE HCL 1 MG/ML IJ SOLN
INTRAMUSCULAR | Status: AC
  Filled 2014-04-25: qty 1

## 2014-04-25 MED ORDER — MUPIROCIN 2 % EX OINT
1.0000 "application " | TOPICAL_OINTMENT | Freq: Two times a day (BID) | CUTANEOUS | Status: AC
  Administered 2014-04-25 – 2014-04-30 (×9): 1 via NASAL
  Filled 2014-04-25 (×2): qty 22

## 2014-04-25 MED ORDER — METOPROLOL TARTRATE 12.5 MG HALF TABLET
12.5000 mg | ORAL_TABLET | Freq: Once | ORAL | Status: DC
  Filled 2014-04-25: qty 1

## 2014-04-25 NOTE — H&P (Signed)
  Please see office visit notes for complete details of HPI.  

## 2014-04-25 NOTE — Progress Notes (Signed)
ANTICOAGULATION CONSULT NOTE - Initial Consult  Pharmacy Consult for heparin  Indication: chest pain/ACS  No Known Allergies  Patient Measurements: Height: 5\' 8"  (172.7 cm) Weight: 190 lb (86.183 kg) IBW/kg (Calculated) : 68.4 Heparin Dosing Weight: 86 kg  Vital Signs: Temp: 98.1 F (36.7 C) (09/22 0650) Temp src: Oral (09/22 0650) BP: 104/60 mmHg (09/22 0650) Pulse Rate: 69 (09/22 1024)  Labs:  Recent Labs  04/25/14 0827  LABPROT 13.3  INR 1.01    Estimated Creatinine Clearance: 59.7 ml/min (by C-G formula based on Cr of 1.58).   Medical History: Past Medical History  Diagnosis Date  . Diabetes mellitus without complication     Medications:  Prescriptions prior to admission  Medication Sig Dispense Refill  . aspirin 81 MG tablet Take 81 mg by mouth daily.      04/27/14 atorvastatin (LIPITOR) 40 MG tablet Take 40 mg by mouth daily.      . insulin glargine (LANTUS) 100 UNIT/ML injection Inject 36 Units into the skin at bedtime.      . insulin lispro (HUMALOG) 100 UNIT/ML injection Inject 4 Units into the skin 3 (three) times daily before meals.       Marland Kitchen losartan (COZAAR) 100 MG tablet Take 100 mg by mouth daily.      . metoprolol tartrate (LOPRESSOR) 25 MG tablet Take 25 mg by mouth 2 (two) times daily.      . cyclobenzaprine (FLEXERIL) 10 MG tablet Take 1 tablet (10 mg total) by mouth 2 (two) times daily as needed for muscle spasms.  20 tablet  0  . HYDROcodone-acetaminophen (NORCO/VICODIN) 5-325 MG per tablet Take 1 tablet by mouth every 4 (four) hours as needed for pain.  10 tablet  0  . tiotropium (SPIRIVA HANDIHALER) 18 MCG inhalation capsule Place 18 mcg into inhaler and inhale daily.        Assessment: 48 yo male with chest pain with minimal exertion. 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  Admitted for possible CABG.  Will begin heparin gtt 8 hours after sheath removal.   Goal of Therapy:  Heparin level 0.3-0.7 units/ml Monitor  platelets by anticoagulation protocol: Yes   Plan:  Heparin gtt at 1050 units/hr  Daily HL, CBC  F/u labs First HL at 0430   44, PharmD, BCPS Clinical Pharmacist Pager: [phone] 04/25/2014 2:08 PM

## 2014-04-25 NOTE — Progress Notes (Signed)
VASCULAR LAB PRELIMINARY  PRELIMINARY  PRELIMINARY  PRELIMINARY  Pre-op Cardiac Surgery  Carotid Findings:  Bilateral:  40-59% internal carotid artery stenosis.  Vertebral artery flow is antegrade.   Upper Extremity Right Left  Brachial Pressures Attempt to place stent in right wrist per the patient. 117 triphasic  Radial Waveforms  triphasic  Ulnar Waveforms  triphasic  Palmar Arch (Allen's Test)  WNL   Findings:  Left:  Doppler waveforms remain normal with ulnar and radial compressions.    Lower  Extremity Right Left  Dorsalis Pedis    Anterior Tibial 137 triphasic 136 monophasic  Posterior Tibial 114 monophasic 105 monophasic  Ankle/Brachial Indices >1.0 >1.0    Findings:  ABI is within normal limits bilaterally.   Silus Lanzo, RVT 04/25/2014, 6:16 PM

## 2014-04-25 NOTE — Consult Note (Signed)
    [address]       [place] [address]             [phone]        [name] [facility] Medical Record #[id number] Date of Birth: 10/10/1965  Referring: Dr. Ganji Primary Care: [name],[name] G, MD  Chief Complaint: Chest Pain, Dyspnea  History of Present Illness:   [name] is 48 yo White Male who presented today for cardiac catheterization.  The patient was evaluated by Dr. Ganji for chest pain and dyspnea.  He underwent stress testing which was abnormal and it was felt he should have cardiac catheterization.  This was performed by Dr. Ganji today which showed a preserved EF and multivessel CAD.  It was felt coronary bypass grafting would be indicated and TCTS consult was requested.  The patient is currently chest pain free.  He has a significant medical history for insulin dependent diabetes, hypertension, hyperlipidemia, and nicotine abuse.  Current Activity/ Functional Status: Patient is independent with mobility/ambulation, transfers, ADL's, IADL's.   Zubrod Score: At the time of surgery this patient's most appropriate activity status/level should be described as: [x]    0    Normal activity, no symptoms []    1    Restricted in physical strenuous activity but ambulatory, able to do out light work []    2    Ambulatory and capable of self care, unable to do work activities, up and about                 more than 50%  Of the time                            []    3    Only limited self care, in bed greater than 50% of waking hours []    4    Completely disabled, no self care, confined to bed or chair []    5    Moribund  Past Medical History  Diagnosis Date  . Diabetes mellitus without complication     No past surgical history on file.  History  Smoking status  . Current Every Day Smoker  Smokeless tobacco  . Not on file    History  Alcohol Use  . Yes    History   Social History  . Marital Status: Married    Spouse Name: N/A      Number of Children: N/A  . Years of Education: N/A   Occupational History  . Not on file.   Social History Main Topics  . Smoking status: Current Every Day Smoker  . Smokeless tobacco: Not on file  . Alcohol Use: Yes  . Drug Use: Not on file  . Sexual Activity: Not on file   Other Topics Concern  . Not on file   Social History Narrative  . No narrative on file    No Known Allergies  Current Facility-Administered Medications  Medication Dose Route Frequency Provider Last Rate Last Dose  . 0.9 %  sodium chloride infusion  1 mL/kg/hr Intravenous Continuous Jagadeesh R Ganji, MD      . 0.9 %  sodium chloride infusion  250 mL Intravenous PRN Jagadeesh R Ganji, MD      . acetaminophen (TYLENOL) tablet 650 mg  650 mg Oral Q4H PRN Jagadeesh R Ganji, MD      . [START ON 04/26/2014] aminocaproic acid (AMICAR)   10 g in sodium chloride 0.9 % 100 mL infusion   Intravenous To OR Jagadeesh R Ganji, MD      . aspirin 81 MG chewable tablet           . aspirin EC tablet 81 mg  81 mg Oral Daily Jagadeesh R Ganji, MD      . atorvastatin (LIPITOR) tablet 40 mg  40 mg Oral Daily Jagadeesh R Ganji, MD      . [START ON 04/26/2014] cefUROXime (ZINACEF) 750 mg in dextrose 5 % 50 mL IVPB  750 mg Intravenous To OR Jagadeesh R Ganji, MD      . cyclobenzaprine (FLEXERIL) tablet 10 mg  10 mg Oral BID PRN Jagadeesh R Ganji, MD      . [START ON 04/26/2014] dexmedetomidine (PRECEDEX) 400 MCG/100ML (4 mcg/mL) infusion  0.1-0.7 mcg/kg/hr Intravenous To OR Jagadeesh R Ganji, MD      . [START ON 04/26/2014] DOPamine (INTROPIN) 800 mg in dextrose 5 % 250 mL (3.2 mg/mL) infusion  2-20 mcg/kg/min Intravenous To OR Jagadeesh R Ganji, MD      . [START ON 04/26/2014] EPINEPHrine (ADRENALIN) 4 mg in dextrose 5 % 250 mL (0.016 mg/mL) infusion  0.5-20 mcg/min Intravenous To OR Jagadeesh R Ganji, MD      . [START ON 04/26/2014] heparin 2,500 Units, papaverine 30 mg in electrolyte-148 (PLASMALYTE-148) 500 mL irrigation   Irrigation  To OR Jagadeesh R Ganji, MD      . [START ON 04/26/2014] heparin 30,000 units/NS 1000 mL solution for CELLSAVER   Other To OR Jagadeesh R Ganji, MD      . HYDROcodone-acetaminophen (NORCO/VICODIN) 5-325 MG per tablet 1 tablet  1 tablet Oral Q4H PRN Jagadeesh R Ganji, MD      . insulin aspart (novoLOG) injection 0-15 Units  0-15 Units Subcutaneous TID WC Jagadeesh R Ganji, MD   5 Units at 04/25/14 1232  . insulin aspart (novoLOG) injection 4 Units  4 Units Subcutaneous TID WC Jagadeesh R Ganji, MD      . insulin glargine (LANTUS) injection 37 Units  37 Units Subcutaneous QHS Jagadeesh R Ganji, MD      . [START ON 04/26/2014] insulin regular (NOVOLIN R,HUMULIN R) 250 Units in sodium chloride 0.9 % 250 mL (1 Units/mL) infusion   Intravenous To OR Jagadeesh R Ganji, MD      . losartan (COZAAR) tablet 25 mg  25 mg Oral Daily Jagadeesh R Ganji, MD      . [START ON 04/26/2014] magnesium sulfate (IV Push/IM) injection 40 mEq  40 mEq Other To OR Jagadeesh R Ganji, MD      . metoprolol tartrate (LOPRESSOR) tablet 25 mg  25 mg Oral BID Jagadeesh R Ganji, MD      . [START ON 04/26/2014] nitroGLYCERIN 50 mg in dextrose 5 % 250 mL (0.2 mg/mL) infusion  2-200 mcg/min Intravenous To OR Jagadeesh R Ganji, MD      . ondansetron (ZOFRAN) injection 4 mg  4 mg Intravenous Q6H PRN Jagadeesh R Ganji, MD      . [START ON 04/26/2014] phenylephrine (NEO-SYNEPHRINE) 20 mg in dextrose 5 % 250 mL (0.08 mg/mL) infusion  30-200 mcg/min Intravenous To OR Jagadeesh R Ganji, MD      . [START ON 04/26/2014] potassium chloride injection 80 mEq  80 mEq Other To OR Jagadeesh R Ganji, MD      . sodium chloride 0.9 % injection 3 mL  3 mL Intravenous Q12H Jagadeesh R Ganji, MD      .   sodium chloride 0.9 % injection 3 mL  3 mL Intravenous PRN Pamella Pert, MD      . tiotropium [facility]) inhalation capsule 18 mcg  18 mcg Inhalation Daily Pamella Pert, MD        Prescriptions prior to admission  Medication Sig Dispense Refill  .  aspirin 81 MG tablet Take 81 mg by mouth daily.      Marland Kitchen atorvastatin (LIPITOR) 40 MG tablet Take 40 mg by mouth daily.      . insulin glargine (LANTUS) 100 UNIT/ML injection Inject 36 Units into the skin at bedtime.      . insulin lispro (HUMALOG) 100 UNIT/ML injection Inject 4 Units into the skin 3 (three) times daily before meals.       Marland Kitchen losartan (COZAAR) 100 MG tablet Take 100 mg by mouth daily.      . metoprolol tartrate (LOPRESSOR) 25 MG tablet Take 25 mg by mouth 2 (two) times daily.      . cyclobenzaprine (FLEXERIL) 10 MG tablet Take 1 tablet (10 mg total) by mouth 2 (two) times daily as needed for muscle spasms.  20 tablet  0  . HYDROcodone-acetaminophen (NORCO/VICODIN) 5-325 MG per tablet Take 1 tablet by mouth every 4 (four) hours as needed for pain.  10 tablet  0  . tiotropium (SPIRIVA HANDIHALER) 18 MCG inhalation capsule Place 18 mcg into inhaler and inhale daily.        No family history on file.   Review of Systems:     Cardiac Review of Systems: Y or N  Chest Pain [  x]  Resting SOB [  n ] Exertional SOB  [ y ]  Orthopnea [ n   Pedal Edema [ n ]    Palpitations [ n ] Syncope  [n ]   Presyncope [ n  ]  General Review of Systems: [Y] = yes [  ]=no Constitional: recent weight change [  ]; anorexia [  ]; fatigue [ y ]; nausea [  ]; night sweats [  ]; fever [  ]; or chills [  ]                                                               Dental: poor dentition[  ]; Last Dentist visit:   Eye : blurred vision [  ]; diplopia [   ]; vision changes [  ];  Amaurosis fugax[  ]; Resp: cough [  ];  wheezing[  ];  hemoptysis[  ]; shortness of breath[  ]; paroxysmal nocturnal dyspnea[  ]; dyspnea on exertion[  ]; or orthopnea[  ];  GI:  gallstones[  ], vomiting[  ];  dysphagia[  ]; melena[  ];  hematochezia [  ]; heartburn[  ];   Hx of  Colonoscopy[  ]; GU: kidney stones [  ]; hematuria[  ];   dysuria [  ];  nocturia[  ];  history of     obstruction [  ]; urinary frequency [  ]              Skin: rash, swelling[  ];, hair loss[  ];  peripheral edema[  ];  or itching[  ]; Musculosketetal: myalgias[  ];  joint swelling[  ];  joint erythema[  ];  joint pain[  ];  back pain[  ];  Heme/Lymph: bruising[  ];  bleeding[  ];  anemia[  ];  Neuro: TIA[  ];  headaches[  ];  stroke[  ];  vertigo[  ];  seizures[  ];   paresthesias[  ];  difficulty walking[  ];  Psych:depression[  ]; anxiety[  ];  Endocrine: diabetes[  ];  thyroid dysfunction[  ];  Immunizations: Flu [  ]; Pneumococcal[  ];  Other:  Physical Exam: BP 104/60  Pulse 69  Temp(Src) 98.1 F (36.7 C) (Oral)  Resp 20  Ht 5' 8" (1.727 m)  Wt 190 lb (86.183 kg)  BMI 28.90 kg/m2  SpO2 98%  General appearance: alert, cooperative and no distress Heart: regular rate and rhythm Lungs: clear to auscultation bilaterally Abdomen: soft, non-tender; bowel sounds normal; no masses,  no organomegaly Extremities: extremities normal, atraumatic, no cyanosis or edema Neuro: intact No carotid bruits. palpable dp and pt pulses   Diagnostic Studies & Laboratory data:     Recent Radiology Findings:   No results found.    Recent Lab Findings: Lab Results  Component Value Date   HGB 15.3 07/22/2007   GLUCOSE 48* 07/21/2007   NA 139 07/21/2007   K 4.6 07/21/2007   CL 103 07/21/2007   CREATININE 1.58* 07/21/2007   BUN 16 07/21/2007   CO2 29 07/21/2007   INR 1.01 04/25/2014   Cath: Hemodynamic data:  Left ventricular pressure was 89/11 with LVEDP of 18 mm mercury. Aortic pressure was 86/55 with a mean of 69 mm mercury. There was no pressure gradient across the aortic valve  Left ventricle: Performed in the RAO projection revealed LVEF of 55-60%. There was no significant MR. no significant wall motion abnormality.  Right coronary artery: Dominant. Proximal to mid segment shows a 30-40% stenosis followed by the midsegment at the origin of the RV branch showing 80% stenosis. Gives origin to large area of and large PDA, at the  bifurcation, just proximal, after PDA and PL branch, there is a 60-70% stenosis. Ostium of the PL shows a 50-60% stenosis. Midsegment of the PA has a 40-50% stenosis. Faint collaterals to the LAD are present.  Left main coronary artery is large and normal. His mild calcification is evident.  Circumflex coronary artery: There is moderate amount of calcification evident in the circumflex coronary artery in the proximal and midsegment. The circumflex coronary artery has a high-grade ulcerated 99% long segment stenosis extending from the proximal segment of the midsegment. The circumflex coronary artery gives origin to a large obtuse marginal 1. After the origin of the marginal 1, the circumflex coronary artery is subtotally occluded with TIMI 2 flow and continues as a large OM 2. Both OM 1 and OM 2 are good targets for bypass surgery.  LAD: Heavily calcified in the proximal segment. The proximal LAD shows a central 70% stenosis in the proximal and followed by origin of a large septal perforator and a large diagonal 1. The diagonal 1 ostium has a 95% stenosis followed by a tandem 80-90% stenosis in the proximal end. Distal vessel is relatively healthy and graft will. The LAD after the origin of this large D1 is subtotally occluded TIMI 2 flow in the vessel. Distal target in the LAD is graftable.  Left subclavian artery and LIMA: Widely patent next  Right subclavian artery and RIMA : Widely patent  Recommendation: Patient has extremely high risk coronary anatomy with high risk of sudden cardiac cath with all 3   vessels subtotally occluded that includes large D1 large LAD and circumflex coronary artery and has a high-grade mid RCA stenosis. There is TIMI 2 flow in the circumflex and LAD. Patient has been having chest pain even with minimal activities, unstable angina:. He'll be admitted to the hospital for consideration for CABG in the inpatient setting.     Chronic Kidney Disease   Stage I     GFR >90  Stage  II    GFR 60-89  Stage IIIA GFR 45-59  Stage IIIB GFR 30-44  Stage IV   GFR 15-29  Stage V    GFR  <15  Lab Results  Component Value Date   CREATININE 1.57* 04/25/2014   Estimated Creatinine Clearance: 60.1 ml/min (by C-G formula based on Cr of 1.57). Of note patient is right handed, but has had bilateral carpal tunnel release procedures as well as an ulnar nerve release in his left arm,  Assessment / Plan:    1. CAD- needs CABG 2. Nicotine abuse- PFTs ordered, patient will require education on smoking cessation 3. Insulin Dependent diabetes- Hgb A1c pending 4. HTN- continue home Cozaar, Lopressor 5. Chronic renal disease  Stage II to Stage IIIA  I heave recommended we proceed with CABG, with severe 3 vessel disease and DM CABG offers best relief of symptoms and preservation of lv function. With history of bilateral hand and elbow nerve surgery will not use radials. I have discussed with patient need to stop smoking.  The goals risks and alternatives of the planned surgical procedure CABG  have been discussed with the patient in detail. The risks of the procedure including death, infection, stroke, myocardial infarction, bleeding, blood transfusion have all been discussed specifically.  I have quoted [name] a 4 % of perioperative mortality and a complication rate as high as 20 %. The patient's questions have been answered.[name] is willing  to proceed with the planned procedure.  Delight Ovens MD      [address] [place] [place] [address] Office [phone]   Beeper [phone]

## 2014-04-25 NOTE — Interval H&P Note (Signed)
History and Physical Interval Note:  04/25/2014 10:33 AM  [name]  has presented today for surgery, with the diagnosis of abnormal stress  The various methods of treatment have been discussed with the patient and family. After consideration of risks, benefits and other options for treatment, the patient has consented to  Procedure(s): LEFT HEART CATHETERIZATION WITH CORONARY ANGIOGRAM (N/A) possible PCI as a surgical intervention .  The patient's history has been reviewed, patient examined, no change in status, stable for surgery.  I have reviewed the patient's chart and labs.  Questions were answered to the patient's satisfaction.   Cath Lab Visit (complete for each Cath Lab visit)  Clinical Evaluation Leading to the Procedure:   ACS: No.  Non-ACS:    Anginal Classification: CCS III  Anti-ischemic medical therapy: Minimal Therapy (1 class of medications)  Non-Invasive Test Results: High-risk stress test findings: cardiac mortality >3%/year  Prior CABG: No previous CABG        [facility]

## 2014-04-25 NOTE — Care Management Note (Unsigned)
    Page 1 of 1   05/03/2014     4:25:57 PM CARE MANAGEMENT NOTE 05/03/2014  Patient:  JARMAN, LITTON   Account Number:  [id number]  Date Initiated:  04/25/2014  Documentation initiated by:  GRAVES-BIGELOW,BRENDA  Subjective/Objective Assessment:   Pt admitted due to abnormal stress test. Pt s/p cath that revealed 3 vessels subtotally occluded that includes large D1 large LAD and circumflex coronary artery and has a high-grade mid RCA stenosis. Plan consult for CABG.     Action/Plan:   CM will monitor for disposition needs.   Anticipated DC Date:  05/05/2014   Anticipated DC Plan:  HOME W HOME HEALTH SERVICES      DC Planning Services  CM consult      Choice offered to / List presented to:             Status of service:  In process, will continue to follow Medicare Important Message given?   (If response is "NO", the following Medicare IM given date fields will be blank) Date Medicare IM given:   Medicare IM given by:   Date Additional Medicare IM given:   Additional Medicare IM given by:    Discharge Disposition:    Per UR Regulation:  Reviewed for med. necessity/level of care/duration of stay  If discussed at Long Length of Stay Meetings, dates discussed:    Comments:  05/03/14 Sidney Ace, RN, BSN [phone] pt transferred to [facility] on 9/28  sp CABG x 5 on 9/24. PTA, pt resides at home with spouse.  Wife to provide care at dc; will follow progress for dc needs.

## 2014-04-25 NOTE — CV Procedure (Signed)
Procedure performed:  Left heart catheterization including hemodynamic monitoring of the left ventricle, LV gram, selective right and left coronary arteriography. Left subclavian and right subclavian arteriogram with IMA angiography.  Indication patient is a 48 year-old Caucasian male with history of hypertension,  hyperlipidemia,  Diabetes Mellitus, strong family history of premature coronary artery disease, history of tobacco use disorder   who presents with class III to class IV angina pectoris. Patient has  had non invasive testing which was abnormal, suggestive of multivessel coronary artery disease.  Hence is brought to the cardiac catheterization lab to evaluate the  coronary anatomy for definitive diagnosis of CAD.  Hemodynamic data:  Left ventricular pressure was 89/11 with LVEDP of 18 mm mercury. Aortic pressure was 86/55 with a mean of 69 mm mercury. There was no pressure gradient across the aortic valve  Left ventricle: Performed in the RAO projection revealed LVEF of 55-60%. There was no significant MR. no significant wall motion abnormality.  Right coronary artery: Dominant. Proximal to mid segment shows a 30-40% stenosis followed by the midsegment at the origin of the RV branch showing 80% stenosis. Gives origin to large area of and large PDA, at the bifurcation, just proximal, after PDA and PL branch, there is a 60-70% stenosis. Ostium of the PL shows a 50-60% stenosis. Midsegment of the PA has a 40-50% stenosis. Faint collaterals to the LAD are present.  Left main coronary artery is large and normal. His mild calcification is evident.  Circumflex coronary artery: There is moderate amount of calcification evident in the circumflex coronary artery in the proximal and midsegment. The circumflex coronary artery has a high-grade ulcerated 99% long segment stenosis extending from the proximal segment of the midsegment. The circumflex coronary artery gives origin to a large obtuse marginal  1. After the origin of the marginal 1, the circumflex coronary artery is subtotally occluded with TIMI 2 flow and continues as a large OM 2. Both OM 1 and OM 2 are good targets for bypass surgery.   LAD:  Heavily calcified in the proximal segment. The proximal LAD shows a central 70% stenosis in the proximal and followed by origin of a large septal perforator and a large diagonal 1. The diagonal 1 ostium has a 95% stenosis followed by a tandem 80-90% stenosis in the proximal end. Distal vessel is relatively healthy and graft will. The LAD after the origin of this large D1 is subtotally occluded TIMI 2 flow in the vessel. Distal target in the LAD is graftable.  Left subclavian artery and LIMA: Widely patent next  Right subclavian artery and RIMA : Widely patent  Recommendation: Patient has extremely high risk coronary anatomy with high risk of sudden cardiac cath with all 3 vessels subtotally occluded that includes large D1 large LAD and circumflex coronary artery and has a high-grade mid RCA stenosis. There is TIMI 2 flow in the circumflex and LAD. Patient has been having chest pain even with minimal activities, unstable angina:. He'll be admitted to the hospital for consideration for CABG in the inpatient setting.   Technique: Under sterile precautions using a 6 French right radial  arterial access, a 6 French sheath was introduced into the right radial artery. A 5 [place] Tig 4 catheter was advanced into the ascending aorta selective  right coronary artery and left coronary artery was cannulated and angiography was performed in multiple views. The catheter was pulled back Out of the body over exchange length J-wire. Same Catheter was used to perform LV  gram which was performed in RAO projection.  Catheter exchanged out of the body over J-Wire. NO immediate complications noted. Patient tolerated the procedure well. A total of 80 cc of contrast was utilized for diagnostic angiography.

## 2014-04-26 ENCOUNTER — Encounter (HOSPITAL_COMMUNITY): Payer: Self-pay | Admitting: General Practice

## 2014-04-26 LAB — HEPARIN LEVEL (UNFRACTIONATED)
Heparin Unfractionated: 0.11 IU/mL — ABNORMAL LOW (ref 0.30–0.70)
Heparin Unfractionated: 0.47 IU/mL (ref 0.30–0.70)

## 2014-04-26 LAB — CBC
HCT: 34.1 % — ABNORMAL LOW (ref 39.0–52.0)
Hemoglobin: 11.4 g/dL — ABNORMAL LOW (ref 13.0–17.0)
MCH: 31.7 pg (ref 26.0–34.0)
MCHC: 33.4 g/dL (ref 30.0–36.0)
MCV: 94.7 fL (ref 78.0–100.0)
Platelets: 188 10*3/uL (ref 150–400)
RBC: 3.6 MIL/uL — ABNORMAL LOW (ref 4.22–5.81)
RDW: 13.3 % (ref 11.5–15.5)
WBC: 10.4 10*3/uL (ref 4.0–10.5)

## 2014-04-26 LAB — BASIC METABOLIC PANEL
Anion gap: 10 (ref 5–15)
BUN: 16 mg/dL (ref 6–23)
CO2: 27 mEq/L (ref 19–32)
Calcium: 8.3 mg/dL — ABNORMAL LOW (ref 8.4–10.5)
Chloride: 102 mEq/L (ref 96–112)
Creatinine, Ser: 1.45 mg/dL — ABNORMAL HIGH (ref 0.50–1.35)
GFR calc Af Amer: 64 mL/min — ABNORMAL LOW (ref >90)
GFR calc non Af Amer: 55 mL/min — ABNORMAL LOW (ref >90)
Glucose, Bld: 293 mg/dL — ABNORMAL HIGH (ref 70–99)
Potassium: 4.4 mEq/L (ref 3.7–5.3)
Sodium: 139 mEq/L (ref 137–147)

## 2014-04-26 LAB — HEMOGLOBIN A1C
Hgb A1c MFr Bld: 7.4 % — ABNORMAL HIGH (ref <5.7)
Mean Plasma Glucose: 166 mg/dL — ABNORMAL HIGH (ref <117)

## 2014-04-26 LAB — GLUCOSE, CAPILLARY
Glucose-Capillary: 223 mg/dL — ABNORMAL HIGH (ref 70–99)
Glucose-Capillary: 93 mg/dL (ref 70–99)
Glucose-Capillary: 151 mg/dL — ABNORMAL HIGH (ref 70–99)
Glucose-Capillary: 167 mg/dL — ABNORMAL HIGH (ref 70–99)

## 2014-04-26 MED ORDER — VANCOMYCIN HCL 10 G IV SOLR
1250.0000 mg | INTRAVENOUS | Status: AC
  Administered 2014-04-27: 1250 mg via INTRAVENOUS
  Filled 2014-04-26: qty 1250

## 2014-04-26 MED ORDER — METOPROLOL TARTRATE 12.5 MG HALF TABLET
12.5000 mg | ORAL_TABLET | Freq: Once | ORAL | Status: AC
  Administered 2014-04-27: 12.5 mg via ORAL
  Filled 2014-04-26: qty 1

## 2014-04-26 MED ORDER — SODIUM CHLORIDE 0.9 % IV SOLN
INTRAVENOUS | Status: DC
  Filled 2014-04-26: qty 40

## 2014-04-26 MED ORDER — DEXTROSE 5 % IV SOLN
750.0000 mg | INTRAVENOUS | Status: DC
  Filled 2014-04-26: qty 750

## 2014-04-26 MED ORDER — MAGNESIUM SULFATE 50 % IJ SOLN
40.0000 meq | INTRAMUSCULAR | Status: DC
  Filled 2014-04-26: qty 10

## 2014-04-26 MED ORDER — DEXTROSE 5 % IV SOLN
1.5000 g | INTRAVENOUS | Status: AC
  Administered 2014-04-27: .75 g via INTRAVENOUS
  Administered 2014-04-27: 1.5 g via INTRAVENOUS
  Filled 2014-04-26: qty 1.5

## 2014-04-26 MED ORDER — CHLORHEXIDINE GLUCONATE 4 % EX LIQD
60.0000 mL | Freq: Once | CUTANEOUS | Status: AC
  Administered 2014-04-26: 4 via TOPICAL
  Filled 2014-04-26: qty 60

## 2014-04-26 MED ORDER — NITROGLYCERIN IN D5W 200-5 MCG/ML-% IV SOLN: 2.0000 ug/min | INTRAVENOUS | Status: DC

## 2014-04-26 MED ORDER — SODIUM CHLORIDE 0.9 % IV SOLN
INTRAVENOUS | Status: DC
  Filled 2014-04-26: qty 2.5

## 2014-04-26 MED ORDER — BISACODYL 5 MG PO TBEC
5.0000 mg | DELAYED_RELEASE_TABLET | Freq: Once | ORAL | Status: DC
  Filled 2014-04-26: qty 1

## 2014-04-26 MED ORDER — POTASSIUM CHLORIDE 2 MEQ/ML IV SOLN
80.0000 meq | INTRAVENOUS | Status: DC
Start: 2014-04-27 — End: 2014-04-26
  Filled 2014-04-26: qty 40

## 2014-04-26 MED ORDER — DOPAMINE-DEXTROSE 3.2-5 MG/ML-% IV SOLN: 2.0000 ug/kg/min | INTRAVENOUS | Status: DC

## 2014-04-26 MED ORDER — DEXMEDETOMIDINE HCL IN NACL 400 MCG/100ML IV SOLN
0.1000 ug/kg/h | INTRAVENOUS | Status: AC
  Administered 2014-04-27: 0.2 ug/kg/h via INTRAVENOUS
  Filled 2014-04-26: qty 100

## 2014-04-26 MED ORDER — PAPAVERINE HCL 30 MG/ML IJ SOLN
INTRAMUSCULAR | Status: DC
  Filled 2014-04-26: qty 2.5

## 2014-04-26 MED ORDER — TEMAZEPAM 15 MG PO CAPS: 15.0000 mg | ORAL_CAPSULE | Freq: Once | ORAL | Status: AC | PRN

## 2014-04-26 MED ORDER — SODIUM CHLORIDE 0.9 % IV SOLN
INTRAVENOUS | Status: DC
  Filled 2014-04-26: qty 30

## 2014-04-26 MED ORDER — CEFUROXIME SODIUM 1.5 G IJ SOLR
1.5000 g | INTRAMUSCULAR | Status: DC
  Filled 2014-04-26: qty 1.5

## 2014-04-26 MED ORDER — CHLORHEXIDINE GLUCONATE 4 % EX LIQD
60.0000 mL | Freq: Once | CUTANEOUS | Status: AC
  Administered 2014-04-27: 4 via TOPICAL

## 2014-04-26 MED ORDER — SODIUM CHLORIDE 0.9 % IV SOLN
INTRAVENOUS | Status: AC
  Administered 2014-04-27: 69.8 mL/h via INTRAVENOUS
  Filled 2014-04-26: qty 40

## 2014-04-26 MED ORDER — EPINEPHRINE HCL 1 MG/ML IJ SOLN
0.5000 ug/min | INTRAVENOUS | Status: DC
  Filled 2014-04-26: qty 4

## 2014-04-26 MED ORDER — DEXMEDETOMIDINE HCL IN NACL 400 MCG/100ML IV SOLN
0.1000 ug/kg/h | INTRAVENOUS | Status: DC
  Filled 2014-04-26: qty 100

## 2014-04-26 MED ORDER — HEPARIN SODIUM (PORCINE) 1000 UNIT/ML IJ SOLN
INTRAMUSCULAR | Status: DC
  Filled 2014-04-26: qty 30

## 2014-04-26 MED ORDER — DOPAMINE-DEXTROSE 3.2-5 MG/ML-% IV SOLN
2.0000 ug/kg/min | INTRAVENOUS | Status: AC
  Administered 2014-04-27: 5 ug/kg/min via INTRAVENOUS
  Filled 2014-04-26: qty 250

## 2014-04-26 MED ORDER — DEXTROSE 5 % IV SOLN
0.5000 ug/min | INTRAVENOUS | Status: DC
  Filled 2014-04-26: qty 4

## 2014-04-26 MED ORDER — PHENYLEPHRINE HCL 10 MG/ML IJ SOLN
30.0000 ug/min | INTRAVENOUS | Status: DC
  Filled 2014-04-26: qty 2

## 2014-04-26 NOTE — Progress Notes (Signed)
Subjective:  No further chest pain although c/o dyspnea. No PND. Motivated to quit smoking  Objective:  Vital Signs in the last 24 hours: Temp:  [98.1 F (36.7 C)-100.1 F (37.8 C)] 98.1 F (36.7 C) (09/23 1438) Pulse Rate:  [56-68] 56 (09/23 1438) Resp:  [18] 18 (09/23 1438) BP: (109-125)/(50-69) 125/68 mmHg (09/23 1438) SpO2:  [94 %-100 %] 98 % (09/23 1438)  Intake/Output from previous day:    Physical Exam:   General appearance: alert, cooperative and no distress Eyes: negative findings: conjunctivae and sclerae normal Neck: no adenopathy and supple, symmetrical, trachea midline Neck: JVP - normal, carotids 2+= without bruits Resp: clear to auscultation bilaterally Chest wall: no tenderness Cardio: regular rate and rhythm, S1, S2 normal, no murmur, click, rub or gallop GI: soft, non-tender; bowel sounds normal; no masses,  no organomegaly Extremities: extremities normal, atraumatic, no cyanosis or edema    Lab Results: BMP  Recent Labs  04/25/14 1625 04/25/14 1835 04/26/14 0359  NA 139 137 139  K 4.3 4.5 4.4  CL 102 103 102  CO2 29 24 27   GLUCOSE 184* 170* 293*  BUN 14 14 16   CREATININE 1.57* 1.51* 1.45*  CALCIUM 8.6 8.9 8.3*  GFRNONAA 50* 52* 55*  GFRAA 58* 61* 64*    CBC  Recent Labs Lab 04/26/14 0359  WBC 10.4  RBC 3.60*  HGB 11.4*  HCT 34.1*  PLT 188  MCV 94.7  MCH 31.7  MCHC 33.4  RDW 13.3    HEMOGLOBIN A1C Lab Results  Component Value Date   HGBA1C 7.4* 04/25/2014   MPG 166* 04/25/2014    Cardiac Panel (last 3 results) No results found for this basename: CKTOTAL, CKMB, TROPONINI, RELINDX,  in the last 8760 hours  BNP (last 3 results) No results found for this basename: PROBNP,  in the last 8760 hours  TSH No results found for this basename: TSH,  in the last 8760 hours  CHOLESTEROL No results found for this basename: CHOL,  in the last 8760 hours  Hepatic Function Panel  Recent Labs  04/25/14 1835  PROT 7.4  ALBUMIN  3.8  AST 34  ALT 34  ALKPHOS 107  BILITOT 0.2*    Cardiac Studies: Out Patient on our office: Lexiscan sestamibi stress test 04/21/2014: 1. Resting EKG NSR, Poor R wave progression. Stress EKG was non diagnostic for ischemia. No ST-T changes of ischemia noted with pharmacologic stress testing. Stress symptoms included shortness of breath and chest tightness. 2. The perfusion imaging study demonstrates a moderate to large sized inferior, inferolateral, inferoapical and also distal anterior severe ischemia. This is probably in the distribution of a dominant right coronary artery orifice circumflex coronary artery. Left ventricular systolic function Calculated by QGS was preserved at 57%. Multivessel disease cannot be excluded, overall a high risk study.  EKG 04/25/2014: Normal sinus rhythm, normal axis, inferior and lateral T-wave abnormality, cannot exclude ischemia.   Assessment/Plan: 1.  Unstable angina pectoris 2.  Coronary artery disease of the native vessels 3.  Diabetes mellitus2 controlled 4.  Hyperlipidemia 5.  Tobacco use disorder 6.  Diabetes mellitus with renal manifestation, chronic stage III kidney disease  Recommendation: Patient is presently doing writing CABG.  I'm very concerned about his coronary artery disease, he has TIMI 2 flow in the LAD and circumflex coronary artery.  Hence if he develops any rest pain, we should have a low threshold to proceed with revascularization.  I have discontinued his losartan due to renal insufficiency.  Patient  has stage III chronic kidney disease.   Pamella Pert, M.D. 04/26/2014, 7:53 PM [facility] Pager: [phone] Office: [phone] If no answer: [phone]

## 2014-04-26 NOTE — Progress Notes (Signed)
[id number] Cardiac Rehab Completed pre-op education with pt. He has heart surgery booklet. I gave him pt care guide. We discussed sternal precautions, use of IS and walking post-op. I encouraged pt to watch going for heart surgery video today and instructed him how to get in on. Pt states that his wife works and he is not sure he will have someone to stay with him 24/7 the first week after surgery, but he will discuss it with her and come up with a plan. He has IS and I encouraged him to practice using it today. We will follow pt post-op as ordered. Beatrix Fetters, RN 04/26/2014 10:15 AM

## 2014-04-26 NOTE — Progress Notes (Signed)
ANTICOAGULATION CONSULT NOTE - Initial Consult  Pharmacy Consult for heparin  Indication: chest pain/ACS  No Known Allergies  Patient Measurements: Height: 5\' 8"  (172.7 cm) Weight: 190 lb (86.183 kg) IBW/kg (Calculated) : 68.4 Heparin Dosing Weight: 86 kg  Vital Signs: Temp: 99.4 F (37.4 C) (09/23 0548) Temp src: Oral (09/23 0548) BP: 125/50 mmHg (09/23 0548) Pulse Rate: 63 (09/23 0548)  Labs:  Recent Labs  04/25/14 0827 04/25/14 1625 04/25/14 1835 04/26/14 0359  HGB  --  12.3*  --  11.4*  HCT  --  37.1*  --  34.1*  PLT  --  210  --  188  APTT  --   --  30  --   LABPROT 13.3  --  13.1  --   INR 1.01  --  0.99  --   HEPARINUNFRC  --   --   --  0.11*  CREATININE  --  1.57* 1.51* 1.45*    Estimated Creatinine Clearance: 65.1 ml/min (by C-G formula based on Cr of 1.45).   Medical History: Past Medical History  Diagnosis Date  . Diabetes mellitus without complication     Medications:  Prescriptions prior to admission  Medication Sig Dispense Refill  . aspirin 81 MG tablet Take 81 mg by mouth daily.      04/28/14 atorvastatin (LIPITOR) 40 MG tablet Take 40 mg by mouth daily.      . insulin glargine (LANTUS) 100 UNIT/ML injection Inject 36 Units into the skin at bedtime.      . insulin lispro (HUMALOG) 100 UNIT/ML injection Inject 4 Units into the skin 3 (three) times daily before meals.       Marland Kitchen losartan (COZAAR) 100 MG tablet Take 100 mg by mouth daily.      . metoprolol tartrate (LOPRESSOR) 25 MG tablet Take 25 mg by mouth 2 (two) times daily.      . cyclobenzaprine (FLEXERIL) 10 MG tablet Take 1 tablet (10 mg total) by mouth 2 (two) times daily as needed for muscle spasms.  20 tablet  0  . HYDROcodone-acetaminophen (NORCO/VICODIN) 5-325 MG per tablet Take 1 tablet by mouth every 4 (four) hours as needed for pain.  10 tablet  0  . tiotropium (SPIRIVA HANDIHALER) 18 MCG inhalation capsule Place 18 mcg into inhaler and inhale daily.        Assessment: 48 yo male with  chest pain with minimal exertion. 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  Admitted for possible CABG.  Pt is currently on heparin infusion at 1050 units/hr. HL this AM is subtherapeutic at 0.11. RN reports no s/s of bleeding.    Goal of Therapy:  Heparin level 0.3-0.7 units/ml Monitor platelets by anticoagulation protocol: Yes   Plan:  Increase heparin gtt to 1350 units/hr  Daily HL, CBC  F/u labs F/u 6-hr HL    44, PharmD.  Clinical Pharmacist Pager [phone]

## 2014-04-26 NOTE — Progress Notes (Signed)
ANTICOAGULATION CONSULT NOTE   Pharmacy Consult for heparin  Indication: chest pain/ACS  No Known Allergies  Patient Measurements: Height: 5\' 8"  (172.7 cm) Weight: 190 lb (86.183 kg) IBW/kg (Calculated) : 68.4 Heparin Dosing Weight: 86 kg  Vital Signs: Temp: 98.1 F (36.7 C) (09/23 1438) Temp src: Oral (09/23 1438) BP: 125/68 mmHg (09/23 1438) Pulse Rate: 56 (09/23 1438)  Labs:  Recent Labs  04/25/14 0827 04/25/14 1625 04/25/14 1835 04/26/14 0359 04/26/14 1530  HGB  --  12.3*  --  11.4*  --   HCT  --  37.1*  --  34.1*  --   PLT  --  210  --  188  --   APTT  --   --  30  --   --   LABPROT 13.3  --  13.1  --   --   INR 1.01  --  0.99  --   --   HEPARINUNFRC  --   --   --  0.11* 0.47  CREATININE  --  1.57* 1.51* 1.45*  --     Estimated Creatinine Clearance: 65.1 ml/min (by C-G formula based on Cr of 1.45).   Medical History: Past Medical History  Diagnosis Date  . Coronary artery disease   . Anginal pain   . Shortness of breath   . Asthma   . Diabetes mellitus without complication     type 1  . 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 albuterol (PROVENTIL HFA;VENTOLIN HFA) 108 (90 BASE) MCG/ACT inhaler Inhale 1-2 puffs into the lungs every 4 (four) hours as needed for wheezing or shortness of breath.      04/28/14 aspirin 81 MG tablet Take 81 mg by mouth daily.      Marland Kitchen buPROPion (WELLBUTRIN SR) 150 MG 12 hr tablet Take 150 mg by mouth 2 (two) times daily.      Marland Kitchen Cod Liver Oil CAPS Take 1 capsule by mouth daily.      Marland Kitchen docusate sodium (COLACE) 100 MG capsule Take 200 mg by mouth daily.      Marland Kitchen SURECLICK 50 MG/ML injection Inject 50 mg into the skin every Sunday.      . insulin glargine (LANTUS) 100 UNIT/ML injection Inject 37 Units into the skin at bedtime.       . insulin lispro (HUMALOG) 100 UNIT/ML injection Inject 0-15 Units into the skin 3 (three) times daily as needed for high blood  sugar.       . losartan (COZAAR) 100 MG tablet Take 100 mg by mouth daily.      . metoprolol tartrate (LOPRESSOR) 25 MG tablet Take 25 mg by mouth 2 (two) times daily.      . Multiple Vitamin (MULTIVITAMIN WITH MINERALS) TABS tablet Take 1 tablet by mouth daily.      . Podiatric Products (UDDERLY SMOOTH FOOT) CREA Apply 1 application topically at bedtime. Apply to feet      . Probiotic Product (PROBIOTIC PO) Take 1 tablet by mouth daily.        Assessment: 48 yo male with chest pain with minimal exertion. 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  Admitted for possible CABG.  Pt is currently on heparin infusion at 1350 units/hr. HL this afternoon is therapeutic at 0.47. RN reports no s/s of bleeding.    Goal of Therapy:  Heparin level 0.3-0.7 units/ml  Monitor platelets by anticoagulation protocol: Yes   Plan:  Continue heparin gtt at 1350 units/hr  Daily HL, CBC   Sheppard Coil PharmD., BCPS Clinical Pharmacist Pager [phone] 04/26/2014 5:04 PM

## 2014-04-27 ENCOUNTER — Inpatient Hospital Stay (HOSPITAL_COMMUNITY): Payer: 59

## 2014-04-27 ENCOUNTER — Encounter (HOSPITAL_COMMUNITY)
Admission: AD | Disposition: A | Discharge status: Home / Self Care | Payer: 59 | Source: Ambulatory Visit | Attending: Cardiothoracic Surgery

## 2014-04-27 ENCOUNTER — Encounter (HOSPITAL_COMMUNITY): Payer: 59 | Admitting: Anesthesiology

## 2014-04-27 ENCOUNTER — Inpatient Hospital Stay (HOSPITAL_COMMUNITY): Payer: 59 | Admitting: Anesthesiology

## 2014-04-27 DIAGNOSIS — I2510 Atherosclerotic heart disease of native coronary artery without angina pectoris: Secondary | ICD-10-CM

## 2014-04-27 HISTORY — DX: Atherosclerotic heart disease of native coronary artery without angina pectoris: I25.10

## 2014-04-27 HISTORY — PX: CORONARY ARTERY BYPASS GRAFT: SHX141

## 2014-04-27 HISTORY — PX: ENDOVEIN HARVEST OF GREATER SAPHENOUS VEIN: SHX5059

## 2014-04-27 HISTORY — PX: INTRAOPERATIVE TRANSESOPHAGEAL ECHOCARDIOGRAM: SHX5062

## 2014-04-27 LAB — POCT I-STAT 3, ART BLOOD GAS (G3+)
Acid-Base Excess: 2 mmol/L (ref 0.0–2.0)
Acid-base deficit: 1 mmol/L (ref 0.0–2.0)
Acid-base deficit: 2 mmol/L (ref 0.0–2.0)
Acid-base deficit: 2 mmol/L (ref 0.0–2.0)
Bicarbonate: 27.3 mEq/L — ABNORMAL HIGH (ref 20.0–24.0)
Bicarbonate: 26.5 mEq/L — ABNORMAL HIGH (ref 20.0–24.0)
Bicarbonate: 25.9 mEq/L — ABNORMAL HIGH (ref 20.0–24.0)
Bicarbonate: 23.6 mEq/L (ref 20.0–24.0)
Bicarbonate: 23.2 mEq/L (ref 20.0–24.0)
O2 Saturation: 100 %
O2 Saturation: 100 %
O2 Saturation: 99 %
O2 Saturation: 98 %
O2 Saturation: 98 %
Patient temperature: 36.2
Patient temperature: 37
Patient temperature: 36.8
TCO2: 29 mmol/L (ref 0–100)
TCO2: 28 mmol/L (ref 0–100)
TCO2: 27 mmol/L (ref 0–100)
TCO2: 25 mmol/L (ref 0–100)
TCO2: 24 mmol/L (ref 0–100)
pCO2 arterial: 47.5 mmHg — ABNORMAL HIGH (ref 35.0–45.0)
pCO2 arterial: 53.5 mmHg — ABNORMAL HIGH (ref 35.0–45.0)
pCO2 arterial: 48.5 mmHg — ABNORMAL HIGH (ref 35.0–45.0)
pCO2 arterial: 43.5 mmHg (ref 35.0–45.0)
pCO2 arterial: 42 mmHg (ref 35.0–45.0)
pH, Arterial: 7.368 (ref 7.350–7.450)
pH, Arterial: 7.304 — ABNORMAL LOW (ref 7.350–7.450)
pH, Arterial: 7.331 — ABNORMAL LOW (ref 7.350–7.450)
pH, Arterial: 7.343 — ABNORMAL LOW (ref 7.350–7.450)
pH, Arterial: 7.349 — ABNORMAL LOW (ref 7.350–7.450)
pO2, Arterial: 339 mmHg — ABNORMAL HIGH (ref 80.0–100.0)
pO2, Arterial: 330 mmHg — ABNORMAL HIGH (ref 80.0–100.0)
pO2, Arterial: 144 mmHg — ABNORMAL HIGH (ref 80.0–100.0)
pO2, Arterial: 115 mmHg — ABNORMAL HIGH (ref 80.0–100.0)
pO2, Arterial: 102 mmHg — ABNORMAL HIGH (ref 80.0–100.0)

## 2014-04-27 LAB — POCT I-STAT, CHEM 8
BUN: 12 mg/dL (ref 6–23)
BUN: 10 mg/dL (ref 6–23)
BUN: 10 mg/dL (ref 6–23)
BUN: 10 mg/dL (ref 6–23)
BUN: 9 mg/dL (ref 6–23)
BUN: 9 mg/dL (ref 6–23)
BUN: 9 mg/dL (ref 6–23)
Calcium, Ion: 1.25 mmol/L — ABNORMAL HIGH (ref 1.12–1.23)
Calcium, Ion: 1.21 mmol/L (ref 1.12–1.23)
Calcium, Ion: 1.13 mmol/L (ref 1.12–1.23)
Calcium, Ion: 1.14 mmol/L (ref 1.12–1.23)
Calcium, Ion: 1.08 mmol/L — ABNORMAL LOW (ref 1.12–1.23)
Calcium, Ion: 1.11 mmol/L — ABNORMAL LOW (ref 1.12–1.23)
Calcium, Ion: 1.15 mmol/L (ref 1.12–1.23)
Chloride: 104 mEq/L (ref 96–112)
Chloride: 106 mEq/L (ref 96–112)
Chloride: 105 mEq/L (ref 96–112)
Chloride: 104 mEq/L (ref 96–112)
Chloride: 105 mEq/L (ref 96–112)
Chloride: 105 mEq/L (ref 96–112)
Chloride: 109 mEq/L (ref 96–112)
Creatinine, Ser: 1.1 mg/dL (ref 0.50–1.35)
Creatinine, Ser: 0.9 mg/dL (ref 0.50–1.35)
Creatinine, Ser: 1 mg/dL (ref 0.50–1.35)
Creatinine, Ser: 1 mg/dL (ref 0.50–1.35)
Creatinine, Ser: 0.9 mg/dL (ref 0.50–1.35)
Creatinine, Ser: 0.9 mg/dL (ref 0.50–1.35)
Creatinine, Ser: 1.1 mg/dL (ref 0.50–1.35)
Glucose, Bld: 174 mg/dL — ABNORMAL HIGH (ref 70–99)
Glucose, Bld: 123 mg/dL — ABNORMAL HIGH (ref 70–99)
Glucose, Bld: 95 mg/dL (ref 70–99)
Glucose, Bld: 85 mg/dL (ref 70–99)
Glucose, Bld: 114 mg/dL — ABNORMAL HIGH (ref 70–99)
Glucose, Bld: 118 mg/dL — ABNORMAL HIGH (ref 70–99)
Glucose, Bld: 101 mg/dL — ABNORMAL HIGH (ref 70–99)
HCT: 35 % — ABNORMAL LOW (ref 39.0–52.0)
HCT: 30 % — ABNORMAL LOW (ref 39.0–52.0)
HCT: 27 % — ABNORMAL LOW (ref 39.0–52.0)
HCT: 28 % — ABNORMAL LOW (ref 39.0–52.0)
HCT: 26 % — ABNORMAL LOW (ref 39.0–52.0)
HCT: 27 % — ABNORMAL LOW (ref 39.0–52.0)
HCT: 32 % — ABNORMAL LOW (ref 39.0–52.0)
Hemoglobin: 11.9 g/dL — ABNORMAL LOW (ref 13.0–17.0)
Hemoglobin: 10.2 g/dL — ABNORMAL LOW (ref 13.0–17.0)
Hemoglobin: 9.2 g/dL — ABNORMAL LOW (ref 13.0–17.0)
Hemoglobin: 9.5 g/dL — ABNORMAL LOW (ref 13.0–17.0)
Hemoglobin: 8.8 g/dL — ABNORMAL LOW (ref 13.0–17.0)
Hemoglobin: 9.2 g/dL — ABNORMAL LOW (ref 13.0–17.0)
Hemoglobin: 10.9 g/dL — ABNORMAL LOW (ref 13.0–17.0)
Potassium: 4.4 mEq/L (ref 3.7–5.3)
Potassium: 4.6 mEq/L (ref 3.7–5.3)
Potassium: 4.7 mEq/L (ref 3.7–5.3)
Potassium: 5.1 mEq/L (ref 3.7–5.3)
Potassium: 3.9 mEq/L (ref 3.7–5.3)
Potassium: 5.2 mEq/L (ref 3.7–5.3)
Potassium: 5 mEq/L (ref 3.7–5.3)
Sodium: 140 mEq/L (ref 137–147)
Sodium: 139 mEq/L (ref 137–147)
Sodium: 140 mEq/L (ref 137–147)
Sodium: 138 mEq/L (ref 137–147)
Sodium: 138 mEq/L (ref 137–147)
Sodium: 141 mEq/L (ref 137–147)
Sodium: 140 mEq/L (ref 137–147)
TCO2: 27 mmol/L (ref 0–100)
TCO2: 27 mmol/L (ref 0–100)
TCO2: 31 mmol/L (ref 0–100)
TCO2: 27 mmol/L (ref 0–100)
TCO2: 26 mmol/L (ref 0–100)
TCO2: 28 mmol/L (ref 0–100)
TCO2: 22 mmol/L (ref 0–100)

## 2014-04-27 LAB — CBC
HCT: 36.1 % — ABNORMAL LOW (ref 39.0–52.0)
HCT: 32 % — ABNORMAL LOW (ref 39.0–52.0)
HCT: 32.2 % — ABNORMAL LOW (ref 39.0–52.0)
Hemoglobin: 12.1 g/dL — ABNORMAL LOW (ref 13.0–17.0)
Hemoglobin: 10.9 g/dL — ABNORMAL LOW (ref 13.0–17.0)
Hemoglobin: 11 g/dL — ABNORMAL LOW (ref 13.0–17.0)
MCH: 30.9 pg (ref 26.0–34.0)
MCH: 30.8 pg (ref 26.0–34.0)
MCH: 31.6 pg (ref 26.0–34.0)
MCHC: 33.5 g/dL (ref 30.0–36.0)
MCHC: 34.1 g/dL (ref 30.0–36.0)
MCHC: 34.2 g/dL (ref 30.0–36.0)
MCV: 92.1 fL (ref 78.0–100.0)
MCV: 90.4 fL (ref 78.0–100.0)
MCV: 92.5 fL (ref 78.0–100.0)
Platelets: 216 10*3/uL (ref 150–400)
Platelets: 107 10*3/uL — ABNORMAL LOW (ref 150–400)
Platelets: 129 10*3/uL — ABNORMAL LOW (ref 150–400)
RBC: 3.92 MIL/uL — ABNORMAL LOW (ref 4.22–5.81)
RBC: 3.54 MIL/uL — ABNORMAL LOW (ref 4.22–5.81)
RBC: 3.48 MIL/uL — ABNORMAL LOW (ref 4.22–5.81)
RDW: 13.3 % (ref 11.5–15.5)
RDW: 13 % (ref 11.5–15.5)
RDW: 13.2 % (ref 11.5–15.5)
WBC: 9.5 10*3/uL (ref 4.0–10.5)
WBC: 12.9 10*3/uL — ABNORMAL HIGH (ref 4.0–10.5)
WBC: 22.7 10*3/uL — ABNORMAL HIGH (ref 4.0–10.5)

## 2014-04-27 LAB — PROTIME-INR
INR: 1.35 (ref 0.00–1.49)
Prothrombin Time: 16.7 seconds — ABNORMAL HIGH (ref 11.6–15.2)

## 2014-04-27 LAB — BASIC METABOLIC PANEL
Anion gap: 7 (ref 5–15)
BUN: 14 mg/dL (ref 6–23)
CO2: 28 mEq/L (ref 19–32)
Calcium: 8.8 mg/dL (ref 8.4–10.5)
Chloride: 104 mEq/L (ref 96–112)
Creatinine, Ser: 1.29 mg/dL (ref 0.50–1.35)
GFR calc Af Amer: 73 mL/min — ABNORMAL LOW (ref >90)
GFR calc non Af Amer: 63 mL/min — ABNORMAL LOW (ref >90)
Glucose, Bld: 236 mg/dL — ABNORMAL HIGH (ref 70–99)
Potassium: 4.8 mEq/L (ref 3.7–5.3)
Sodium: 139 mEq/L (ref 137–147)

## 2014-04-27 LAB — CREATININE, SERUM
Creatinine, Ser: 1.15 mg/dL (ref 0.50–1.35)
GFR calc Af Amer: 84 mL/min — ABNORMAL LOW (ref >90)
GFR calc non Af Amer: 73 mL/min — ABNORMAL LOW (ref >90)

## 2014-04-27 LAB — POCT I-STAT 4, (NA,K, GLUC, HGB,HCT)
Glucose, Bld: 102 mg/dL — ABNORMAL HIGH (ref 70–99)
HCT: 33 % — ABNORMAL LOW (ref 39.0–52.0)
Hemoglobin: 11.2 g/dL — ABNORMAL LOW (ref 13.0–17.0)
Potassium: 3.7 mEq/L (ref 3.7–5.3)
Sodium: 142 mEq/L (ref 137–147)

## 2014-04-27 LAB — GLUCOSE, CAPILLARY
Glucose-Capillary: 255 mg/dL — ABNORMAL HIGH (ref 70–99)
Glucose-Capillary: 104 mg/dL — ABNORMAL HIGH (ref 70–99)
Glucose-Capillary: 80 mg/dL (ref 70–99)
Glucose-Capillary: 90 mg/dL (ref 70–99)
Glucose-Capillary: 86 mg/dL (ref 70–99)

## 2014-04-27 LAB — HEMOGLOBIN AND HEMATOCRIT, BLOOD
HCT: 26.8 % — ABNORMAL LOW (ref 39.0–52.0)
Hemoglobin: 9.3 g/dL — ABNORMAL LOW (ref 13.0–17.0)

## 2014-04-27 LAB — APTT: aPTT: 31 seconds (ref 24–37)

## 2014-04-27 LAB — PLATELET COUNT: Platelets: 154 10*3/uL (ref 150–400)

## 2014-04-27 LAB — MAGNESIUM: Magnesium: 2.7 mg/dL — ABNORMAL HIGH (ref 1.5–2.5)

## 2014-04-27 LAB — HEPARIN LEVEL (UNFRACTIONATED): Heparin Unfractionated: 0.44 IU/mL (ref 0.30–0.70)

## 2014-04-27 SURGERY — CORONARY ARTERY BYPASS GRAFTING (CABG)
Anesthesia: General | Site: Chest | Laterality: Right

## 2014-04-27 MED ORDER — MORPHINE SULFATE 2 MG/ML IJ SOLN
1.0000 mg | INTRAMUSCULAR | Status: AC | PRN
  Filled 2014-04-27: qty 1

## 2014-04-27 MED ORDER — FENTANYL CITRATE 0.05 MG/ML IJ SOLN
INTRAMUSCULAR | Status: AC
  Filled 2014-04-27: qty 5

## 2014-04-27 MED ORDER — PROPOFOL 10 MG/ML IV BOLUS
INTRAVENOUS | Status: DC | PRN
  Administered 2014-04-27: 90 mg via INTRAVENOUS

## 2014-04-27 MED ORDER — PLASMA-LYTE 148 IV SOLN
INTRAVENOUS | Status: DC | PRN
  Administered 2014-04-27: 09:00:00 via INTRAVASCULAR

## 2014-04-27 MED ORDER — DOCUSATE SODIUM 100 MG PO CAPS
200.0000 mg | ORAL_CAPSULE | Freq: Every day | ORAL | Status: DC
  Administered 2014-04-29 – 2014-05-01 (×3): 200 mg via ORAL
  Filled 2014-04-27 (×3): qty 2

## 2014-04-27 MED ORDER — METOPROLOL TARTRATE 25 MG/10 ML ORAL SUSPENSION
12.5000 mg | Freq: Two times a day (BID) | ORAL | Status: DC
  Filled 2014-04-27 (×3): qty 5

## 2014-04-27 MED ORDER — MIDAZOLAM HCL 2 MG/2ML IJ SOLN: 2.0000 mg | INTRAMUSCULAR | Status: DC | PRN

## 2014-04-27 MED ORDER — MIDAZOLAM HCL 10 MG/2ML IJ SOLN
INTRAMUSCULAR | Status: AC
  Filled 2014-04-27: qty 2

## 2014-04-27 MED ORDER — INSULIN REGULAR BOLUS VIA INFUSION
0.0000 [IU] | Freq: Three times a day (TID) | INTRAVENOUS | Status: DC
  Administered 2014-04-28: 0 [IU] via INTRAVENOUS
  Filled 2014-04-27: qty 10

## 2014-04-27 MED ORDER — SODIUM CHLORIDE 0.9 % IV SOLN: 10.0000 g | INTRAVENOUS | Status: DC

## 2014-04-27 MED ORDER — SODIUM CHLORIDE 0.9 % IJ SOLN
3.0000 mL | Freq: Two times a day (BID) | INTRAMUSCULAR | Status: DC
  Administered 2014-04-28 – 2014-05-01 (×7): 3 mL via INTRAVENOUS

## 2014-04-27 MED ORDER — ACETAMINOPHEN 160 MG/5ML PO SOLN: 650.0000 mg | Freq: Once | ORAL | Status: AC

## 2014-04-27 MED ORDER — DOPAMINE-DEXTROSE 3.2-5 MG/ML-% IV SOLN
3.0000 ug/kg/min | INTRAVENOUS | Status: DC
  Administered 2014-04-27: 4 ug/kg/min via INTRAVENOUS
  Administered 2014-04-28: 3 ug/kg/min via INTRAVENOUS

## 2014-04-27 MED ORDER — SODIUM CHLORIDE 0.9 % IJ SOLN: 3.0000 mL | INTRAMUSCULAR | Status: DC | PRN

## 2014-04-27 MED ORDER — ALBUMIN HUMAN 5 % IV SOLN: 250.0000 mL | INTRAVENOUS | Status: AC | PRN

## 2014-04-27 MED ORDER — SODIUM CHLORIDE 0.9 % IV SOLN
INTRAVENOUS | Status: DC
  Filled 2014-04-27: qty 40

## 2014-04-27 MED ORDER — MAGNESIUM SULFATE 4000MG/100ML IJ SOLN
4.0000 g | Freq: Once | INTRAMUSCULAR | Status: AC
  Administered 2014-04-27: 4 g via INTRAVENOUS

## 2014-04-27 MED ORDER — LIDOCAINE HCL (CARDIAC) 20 MG/ML IV SOLN
INTRAVENOUS | Status: AC
  Filled 2014-04-27: qty 5

## 2014-04-27 MED ORDER — HEPARIN SODIUM (PORCINE) 1000 UNIT/ML IJ SOLN
INTRAMUSCULAR | Status: AC
  Filled 2014-04-27: qty 1

## 2014-04-27 MED ORDER — DEXTROSE 5 % IV SOLN
1.5000 g | Freq: Two times a day (BID) | INTRAVENOUS | Status: AC
  Administered 2014-04-27 – 2014-04-29 (×4): 1.5 g via INTRAVENOUS
  Filled 2014-04-27 (×6): qty 1.5

## 2014-04-27 MED ORDER — VECURONIUM BROMIDE 10 MG IV SOLR
INTRAVENOUS | Status: DC | PRN
  Administered 2014-04-27 (×4): 5 mg via INTRAVENOUS

## 2014-04-27 MED ORDER — BUPROPION HCL ER (SR) 150 MG PO TB12
150.0000 mg | ORAL_TABLET | Freq: Two times a day (BID) | ORAL | Status: DC
  Administered 2014-04-28 – 2014-05-06 (×17): 150 mg via ORAL
  Filled 2014-04-27 (×18): qty 1

## 2014-04-27 MED ORDER — VANCOMYCIN HCL IN DEXTROSE 1-5 GM/200ML-% IV SOLN
1000.0000 mg | Freq: Once | INTRAVENOUS | Status: AC
  Administered 2014-04-27: 1000 mg via INTRAVENOUS
  Filled 2014-04-27: qty 200

## 2014-04-27 MED ORDER — 0.9 % SODIUM CHLORIDE (POUR BTL) OPTIME
TOPICAL | Status: DC | PRN
  Administered 2014-04-27: 6000 mL

## 2014-04-27 MED ORDER — BISACODYL 5 MG PO TBEC
10.0000 mg | DELAYED_RELEASE_TABLET | Freq: Every day | ORAL | Status: DC
  Administered 2014-04-29 – 2014-05-01 (×3): 10 mg via ORAL
  Filled 2014-04-27 (×3): qty 2

## 2014-04-27 MED ORDER — CHLORHEXIDINE GLUCONATE 0.12 % MT SOLN
15.0000 mL | Freq: Two times a day (BID) | OROMUCOSAL | Status: DC
  Administered 2014-04-27: 15 mL via OROMUCOSAL
  Filled 2014-04-27: qty 15

## 2014-04-27 MED ORDER — PROPOFOL 10 MG/ML IV BOLUS
INTRAVENOUS | Status: AC
  Filled 2014-04-27: qty 20

## 2014-04-27 MED ORDER — ADULT MULTIVITAMIN W/MINERALS CH
1.0000 | ORAL_TABLET | Freq: Every day | ORAL | Status: DC
  Administered 2014-04-29 – 2014-05-06 (×8): 1 via ORAL
  Filled 2014-04-27 (×9): qty 1

## 2014-04-27 MED ORDER — MORPHINE SULFATE 2 MG/ML IJ SOLN
2.0000 mg | INTRAMUSCULAR | Status: DC | PRN
  Administered 2014-04-27 – 2014-04-29 (×5): 2 mg via INTRAVENOUS
  Filled 2014-04-27 (×3): qty 1
  Filled 2014-04-27: qty 2
  Filled 2014-04-27: qty 1

## 2014-04-27 MED ORDER — DEXTROSE 5 % IV SOLN
20.0000 mg | INTRAVENOUS | Status: DC | PRN
  Administered 2014-04-27: 20 ug/min via INTRAVENOUS

## 2014-04-27 MED ORDER — PANTOPRAZOLE SODIUM 40 MG PO TBEC
40.0000 mg | DELAYED_RELEASE_TABLET | Freq: Every day | ORAL | Status: DC
  Administered 2014-04-29 – 2014-05-01 (×3): 40 mg via ORAL
  Filled 2014-04-27 (×3): qty 1

## 2014-04-27 MED ORDER — ARTIFICIAL TEARS OP OINT
TOPICAL_OINTMENT | OPHTHALMIC | Status: AC
  Filled 2014-04-27: qty 3.5

## 2014-04-27 MED ORDER — LACTATED RINGERS IV SOLN
INTRAVENOUS | Status: DC | PRN
  Administered 2014-04-27 (×6): via INTRAVENOUS

## 2014-04-27 MED ORDER — BISACODYL 10 MG RE SUPP: 10.0000 mg | Freq: Every day | RECTAL | Status: DC

## 2014-04-27 MED ORDER — FAMOTIDINE IN NACL 20-0.9 MG/50ML-% IV SOLN
20.0000 mg | Freq: Two times a day (BID) | INTRAVENOUS | Status: AC
  Administered 2014-04-28: 20 mg via INTRAVENOUS
  Filled 2014-04-27 (×2): qty 50

## 2014-04-27 MED ORDER — ROCURONIUM BROMIDE 100 MG/10ML IV SOLN
INTRAVENOUS | Status: DC | PRN
  Administered 2014-04-27: 50 mg via INTRAVENOUS

## 2014-04-27 MED ORDER — PHENYLEPHRINE HCL 10 MG/ML IJ SOLN
0.0000 ug/min | INTRAVENOUS | Status: DC
  Administered 2014-04-28: 50 ug/min via INTRAVENOUS
  Filled 2014-04-27 (×3): qty 2

## 2014-04-27 MED ORDER — CHLORHEXIDINE GLUCONATE 0.12 % MT SOLN
OROMUCOSAL | Status: AC
  Administered 2014-04-27: 15 mL
  Filled 2014-04-27: qty 15

## 2014-04-27 MED ORDER — HEMOSTATIC AGENTS (NO CHARGE) OPTIME
TOPICAL | Status: DC | PRN
  Administered 2014-04-27: 1 via TOPICAL

## 2014-04-27 MED ORDER — FENTANYL CITRATE 0.05 MG/ML IJ SOLN
INTRAMUSCULAR | Status: DC | PRN
  Administered 2014-04-27: 100 ug via INTRAVENOUS
  Administered 2014-04-27 (×3): 150 ug via INTRAVENOUS
  Administered 2014-04-27: 50 ug via INTRAVENOUS
  Administered 2014-04-27: 250 ug via INTRAVENOUS
  Administered 2014-04-27: 150 ug via INTRAVENOUS
  Administered 2014-04-27: 250 ug via INTRAVENOUS
  Administered 2014-04-27: 100 ug via INTRAVENOUS
  Administered 2014-04-27: 150 ug via INTRAVENOUS
  Administered 2014-04-27: 100 ug via INTRAVENOUS

## 2014-04-27 MED ORDER — ACETAMINOPHEN 160 MG/5ML PO SOLN
1000.0000 mg | Freq: Four times a day (QID) | ORAL | Status: DC
  Filled 2014-04-27: qty 40

## 2014-04-27 MED ORDER — PROTAMINE SULFATE 10 MG/ML IV SOLN
INTRAVENOUS | Status: DC | PRN
  Administered 2014-04-27: 30 mg via INTRAVENOUS
  Administered 2014-04-27 (×2): 20 mg via INTRAVENOUS
  Administered 2014-04-27: 50 mg via INTRAVENOUS
  Administered 2014-04-27: 20 mg via INTRAVENOUS
  Administered 2014-04-27 (×2): 30 mg via INTRAVENOUS
  Administered 2014-04-27: 20 mg via INTRAVENOUS
  Administered 2014-04-27 (×2): 30 mg via INTRAVENOUS
  Administered 2014-04-27: 20 mg via INTRAVENOUS

## 2014-04-27 MED ORDER — HEPARIN SODIUM (PORCINE) 1000 UNIT/ML IJ SOLN
INTRAMUSCULAR | Status: DC | PRN
  Administered 2014-04-27: 30000 [IU] via INTRAVENOUS

## 2014-04-27 MED ORDER — SODIUM CHLORIDE 0.45 % IV SOLN: INTRAVENOUS | Status: DC

## 2014-04-27 MED ORDER — FENTANYL CITRATE 0.05 MG/ML IJ SOLN
INTRAMUSCULAR | Status: AC
  Filled 2014-04-27: qty 2

## 2014-04-27 MED ORDER — ASPIRIN EC 325 MG PO TBEC
325.0000 mg | DELAYED_RELEASE_TABLET | Freq: Every day | ORAL | Status: DC
  Administered 2014-04-28 – 2014-05-01 (×4): 325 mg via ORAL
  Filled 2014-04-27 (×4): qty 1

## 2014-04-27 MED ORDER — DEXMEDETOMIDINE HCL IN NACL 400 MCG/100ML IV SOLN
0.4000 ug/kg/h | INTRAVENOUS | Status: DC
  Filled 2014-04-27: qty 100

## 2014-04-27 MED ORDER — ACETAMINOPHEN 500 MG PO TABS
1000.0000 mg | ORAL_TABLET | Freq: Four times a day (QID) | ORAL | Status: DC
  Administered 2014-04-28 – 2014-05-01 (×13): 1000 mg via ORAL
  Filled 2014-04-27 (×16): qty 2

## 2014-04-27 MED ORDER — CETYLPYRIDINIUM CHLORIDE 0.05 % MT LIQD
7.0000 mL | Freq: Four times a day (QID) | OROMUCOSAL | Status: DC
  Administered 2014-04-27: 7 mL via OROMUCOSAL

## 2014-04-27 MED ORDER — POTASSIUM CHLORIDE 10 MEQ/50ML IV SOLN
10.0000 meq | INTRAVENOUS | Status: AC
  Administered 2014-04-27 (×3): 10 meq via INTRAVENOUS

## 2014-04-27 MED ORDER — METOPROLOL TARTRATE 1 MG/ML IV SOLN: 2.5000 mg | INTRAVENOUS | Status: DC | PRN

## 2014-04-27 MED ORDER — SODIUM CHLORIDE 0.9 % IV SOLN
INTRAVENOUS | Status: DC
  Administered 2014-04-27: 2.9 [IU]/h via INTRAVENOUS
  Filled 2014-04-27 (×2): qty 2.5

## 2014-04-27 MED ORDER — OXYCODONE HCL 5 MG PO TABS
5.0000 mg | ORAL_TABLET | ORAL | Status: DC | PRN
  Administered 2014-04-28 – 2014-05-01 (×12): 10 mg via ORAL
  Filled 2014-04-27 (×12): qty 2

## 2014-04-27 MED ORDER — SODIUM CHLORIDE 0.9 % IV SOLN
INTRAVENOUS | Status: DC
  Administered 2014-04-27: 15:00:00 via INTRAVENOUS
  Administered 2014-04-29: 10 mL/h via INTRAVENOUS

## 2014-04-27 MED ORDER — LACTATED RINGERS IV SOLN
INTRAVENOUS | Status: DC
  Administered 2014-04-28: 15:00:00 via INTRAVENOUS

## 2014-04-27 MED ORDER — DEXMEDETOMIDINE HCL IN NACL 200 MCG/50ML IV SOLN: 0.1000 ug/kg/h | INTRAVENOUS | Status: DC

## 2014-04-27 MED ORDER — ASPIRIN 81 MG PO CHEW: 324.0000 mg | CHEWABLE_TABLET | Freq: Every day | ORAL | Status: DC

## 2014-04-27 MED ORDER — ONDANSETRON HCL 4 MG/2ML IJ SOLN
INTRAMUSCULAR | Status: AC
  Filled 2014-04-27: qty 2

## 2014-04-27 MED ORDER — ONDANSETRON HCL 4 MG/2ML IJ SOLN
4.0000 mg | Freq: Four times a day (QID) | INTRAMUSCULAR | Status: DC | PRN
  Administered 2014-04-27 – 2014-04-28 (×3): 4 mg via INTRAVENOUS
  Filled 2014-04-27 (×3): qty 2

## 2014-04-27 MED ORDER — SODIUM CHLORIDE 0.9 % IV SOLN: 250.0000 mL | INTRAVENOUS | Status: DC

## 2014-04-27 MED ORDER — SODIUM CHLORIDE 0.9 % IJ SOLN
OROMUCOSAL | Status: DC | PRN
  Administered 2014-04-27: 09:00:00 via TOPICAL

## 2014-04-27 MED ORDER — SODIUM CHLORIDE 0.9 % IV SOLN
1.0000 g/h | INTRAVENOUS | Status: DC
  Filled 2014-04-27: qty 40

## 2014-04-27 MED ORDER — CETYLPYRIDINIUM CHLORIDE 0.05 % MT LIQD
7.0000 mL | Freq: Two times a day (BID) | OROMUCOSAL | Status: DC
  Administered 2014-04-28: 7 mL via OROMUCOSAL

## 2014-04-27 MED ORDER — METOPROLOL TARTRATE 12.5 MG HALF TABLET
12.5000 mg | ORAL_TABLET | Freq: Two times a day (BID) | ORAL | Status: DC
  Filled 2014-04-27 (×3): qty 1

## 2014-04-27 MED ORDER — DOPAMINE-DEXTROSE 3.2-5 MG/ML-% IV SOLN
INTRAVENOUS | Status: AC
Start: 2014-04-27 — End: 2014-04-27
  Filled 2014-04-27: qty 250

## 2014-04-27 MED ORDER — VECURONIUM BROMIDE 10 MG IV SOLR
INTRAVENOUS | Status: AC
  Filled 2014-04-27: qty 20

## 2014-04-27 MED ORDER — MIDAZOLAM HCL 2 MG/2ML IJ SOLN
INTRAMUSCULAR | Status: AC
  Filled 2014-04-27: qty 2

## 2014-04-27 MED ORDER — INSULIN REGULAR HUMAN 100 UNIT/ML IJ SOLN
250.0000 [IU] | INTRAMUSCULAR | Status: DC | PRN
  Administered 2014-04-27: 3.3 [IU]/h via INTRAVENOUS

## 2014-04-27 MED ORDER — ALBUMIN HUMAN 5 % IV SOLN
INTRAVENOUS | Status: DC | PRN
  Administered 2014-04-27 (×2): via INTRAVENOUS

## 2014-04-27 MED ORDER — KETOROLAC TROMETHAMINE 15 MG/ML IJ SOLN
15.0000 mg | Freq: Four times a day (QID) | INTRAMUSCULAR | Status: AC
  Administered 2014-04-27 – 2014-04-28 (×3): 15 mg via INTRAVENOUS
  Filled 2014-04-27 (×3): qty 1

## 2014-04-27 MED ORDER — ACETAMINOPHEN 650 MG RE SUPP
650.0000 mg | Freq: Once | RECTAL | Status: AC
  Administered 2014-04-27: 650 mg via RECTAL

## 2014-04-27 MED ORDER — LACTATED RINGERS IV SOLN: 500.0000 mL | Freq: Once | INTRAVENOUS | Status: AC | PRN

## 2014-04-27 MED ORDER — NITROGLYCERIN IN D5W 200-5 MCG/ML-% IV SOLN: 0.0000 ug/min | INTRAVENOUS | Status: DC

## 2014-04-27 MED ORDER — MIDAZOLAM HCL 5 MG/5ML IJ SOLN
INTRAMUSCULAR | Status: DC | PRN
  Administered 2014-04-27: 2 mg via INTRAVENOUS
  Administered 2014-04-27: 3 mg via INTRAVENOUS
  Administered 2014-04-27: 5 mg via INTRAVENOUS
  Administered 2014-04-27: 2 mg via INTRAVENOUS

## 2014-04-27 SURGICAL SUPPLY — 70 items
ATTRACTOMAT 16X20 MAGNETIC DRP (DRAPES) ×4 IMPLANT
BAG DECANTER FOR FLEXI CONT (MISCELLANEOUS) ×4 IMPLANT
BANDAGE ELASTIC 4 VELCRO ST LF (GAUZE/BANDAGES/DRESSINGS) ×4 IMPLANT
BANDAGE ELASTIC 6 VELCRO ST LF (GAUZE/BANDAGES/DRESSINGS) ×4 IMPLANT
BLADE STERNUM SYSTEM 6 (BLADE) ×4 IMPLANT
BLADE SURG 11 STRL SS (BLADE) ×4 IMPLANT
BNDG GAUZE ELAST 4 BULKY (GAUZE/BANDAGES/DRESSINGS) ×4 IMPLANT
CANISTER SUCTION 2500CC (MISCELLANEOUS) ×4 IMPLANT
CARDIAC SUCTION (MISCELLANEOUS) ×4 IMPLANT
CATH CPB KIT GERHARDT (MISCELLANEOUS) ×4 IMPLANT
CATH THORACIC 28FR (CATHETERS) ×4 IMPLANT
COVER SURGICAL LIGHT HANDLE (MISCELLANEOUS) ×4 IMPLANT
CRADLE DONUT ADULT HEAD (MISCELLANEOUS) ×4 IMPLANT
DERMABOND ADVANCED (GAUZE/BANDAGES/DRESSINGS) ×1
DERMABOND ADVANCED .7 DNX12 (GAUZE/BANDAGES/DRESSINGS) ×3 IMPLANT
DRAIN CHANNEL 28F RND 3/8 FF (WOUND CARE) ×4 IMPLANT
DRAPE CARDIOVASCULAR INCISE (DRAPES) ×1
DRAPE SLUSH/WARMER DISC (DRAPES) ×4 IMPLANT
DRAPE SRG 135X102X78XABS (DRAPES) ×3 IMPLANT
DRSG AQUACEL AG ADV 3.5X14 (GAUZE/BANDAGES/DRESSINGS) ×4 IMPLANT
ELECT BLADE 4.0 EZ CLEAN MEGAD (MISCELLANEOUS) ×4
ELECT REM PT RETURN 9FT ADLT (ELECTROSURGICAL) ×8
ELECTRODE BLDE 4.0 EZ CLN MEGD (MISCELLANEOUS) ×3 IMPLANT
ELECTRODE REM PT RTRN 9FT ADLT (ELECTROSURGICAL) ×6 IMPLANT
GAUZE SPONGE 4X4 12PLY STRL (GAUZE/BANDAGES/DRESSINGS) ×12 IMPLANT
GLOVE BIO SURGEON STRL SZ 6.5 (GLOVE) ×12 IMPLANT
GOWN STRL REUS W/ TWL LRG LVL3 (GOWN DISPOSABLE) ×12 IMPLANT
GOWN STRL REUS W/TWL LRG LVL3 (GOWN DISPOSABLE) ×4
HEMOSTAT POWDER SURGIFOAM 1G (HEMOSTASIS) ×12 IMPLANT
HEMOSTAT SURGICEL 2X14 (HEMOSTASIS) ×4 IMPLANT
KIT BASIN OR (CUSTOM PROCEDURE TRAY) ×4 IMPLANT
KIT ROOM TURNOVER OR (KITS) ×4 IMPLANT
KIT SUCTION CATH 14FR (SUCTIONS) ×8 IMPLANT
KIT VASOVIEW W/TROCAR VH 2000 (KITS) ×4 IMPLANT
LEAD PACING MYOCARDI (MISCELLANEOUS) ×4 IMPLANT
MARKER GRAFT CORONARY BYPASS (MISCELLANEOUS) ×12 IMPLANT
NS IRRIG 1000ML POUR BTL (IV SOLUTION) ×20 IMPLANT
PACK OPEN HEART (CUSTOM PROCEDURE TRAY) ×4 IMPLANT
PAD ARMBOARD 7.5X6 YLW CONV (MISCELLANEOUS) ×8 IMPLANT
PAD ELECT DEFIB RADIOL ZOLL (MISCELLANEOUS) ×4 IMPLANT
PENCIL BUTTON HOLSTER BLD 10FT (ELECTRODE) ×4 IMPLANT
PUNCH AORTIC ROTATE  4.5MM 8IN (MISCELLANEOUS) ×4 IMPLANT
SET CARDIOPLEGIA MPS 5001102 (MISCELLANEOUS) ×4 IMPLANT
SPONGE LAP 18X18 X RAY DECT (DISPOSABLE) ×16 IMPLANT
SURGIFLO W/THROMBIN 8M KIT (HEMOSTASIS) ×4 IMPLANT
SUT BONE WAX W31G (SUTURE) ×4 IMPLANT
SUT MNCRL AB 4-0 PS2 18 (SUTURE) ×8 IMPLANT
SUT PROLENE 3 0 SH1 36 (SUTURE) ×4 IMPLANT
SUT PROLENE 4 0 TF (SUTURE) ×8 IMPLANT
SUT PROLENE 5 0 C 1 36 (SUTURE) ×4 IMPLANT
SUT PROLENE 6 0 C 1 30 (SUTURE) ×8 IMPLANT
SUT PROLENE 6 0 CC (SUTURE) ×32 IMPLANT
SUT PROLENE 7 0 BV1 MDA (SUTURE) ×12 IMPLANT
SUT PROLENE 8 0 BV175 6 (SUTURE) ×24 IMPLANT
SUT STEEL 6MS V (SUTURE) ×4 IMPLANT
SUT STEEL SZ 6 DBL 3X14 BALL (SUTURE) ×4 IMPLANT
SUT VIC AB 1 CTX 18 (SUTURE) ×8 IMPLANT
SUT VIC AB 2-0 CT1 27 (SUTURE) ×2
SUT VIC AB 2-0 CT1 TAPERPNT 27 (SUTURE) ×6 IMPLANT
SUTURE E-PAK OPEN HEART (SUTURE) ×4 IMPLANT
SYSTEM SAHARA CHEST DRAIN ATS (WOUND CARE) ×4 IMPLANT
TAPE CLOTH SURG 4X10 WHT LF (GAUZE/BANDAGES/DRESSINGS) ×8 IMPLANT
TAPE PAPER 2X10 WHT MICROPORE (GAUZE/BANDAGES/DRESSINGS) ×4 IMPLANT
TOWEL OR 17X24 6PK STRL BLUE (TOWEL DISPOSABLE) ×8 IMPLANT
TOWEL OR 17X26 10 PK STRL BLUE (TOWEL DISPOSABLE) ×8 IMPLANT
TRAY FOLEY IC TEMP SENS 16FR (CATHETERS) ×4 IMPLANT
TUBE FEEDING 8FR 16IN STR KANG (MISCELLANEOUS) ×4 IMPLANT
TUBING INSUFFLATION (TUBING) ×4 IMPLANT
UNDERPAD 30X30 INCONTINENT (UNDERPADS AND DIAPERS) ×4 IMPLANT
WATER STERILE IRR 1000ML POUR (IV SOLUTION) ×8 IMPLANT

## 2014-04-27 NOTE — H&P (View-Only) (Signed)
[address]       [place] [address]             [phone]        [name] [facility] Medical Record #[id number] Date of Birth: 05-02-1966  Referring: Dr. Jacinto Halim Primary Care: Garlan Fillers, MD  Chief Complaint: Chest Pain, Dyspnea  History of Present Illness:   [name] is 48 yo [place] Male who presented today for cardiac catheterization.  The patient was evaluated by Dr. Jacinto Halim for chest pain and dyspnea.  He underwent stress testing which was abnormal and it was felt he should have cardiac catheterization.  This was performed by Dr. Jacinto Halim today which showed a preserved EF and multivessel CAD.  It was felt coronary bypass grafting would be indicated and TCTS consult was requested.  The patient is currently chest pain free.  He has a significant medical history for insulin dependent diabetes, hypertension, hyperlipidemia, and nicotine abuse.  Current Activity/ Functional Status: Patient is independent with mobility/ambulation, transfers, ADL's, IADL's.   Zubrod Score: At the time of surgery this patient's most appropriate activity status/level should be described as: [x]     0    Normal activity, no symptoms []     1    Restricted in physical strenuous activity but ambulatory, able to do out light work []     2    Ambulatory and capable of self care, unable to do work activities, up and about                 more than 50%  Of the time                            []     3    Only limited self care, in bed greater than 50% of waking hours []     4    Completely disabled, no self care, confined to bed or chair []     5    Moribund  Past Medical History  Diagnosis Date  . Diabetes mellitus without complication     No past surgical history on file.  History  Smoking status  . Current Every Day Smoker  Smokeless tobacco  . Not on file    History  Alcohol Use  . Yes    History   Social History  . Marital Status: Married    Spouse Name: N/A      Number of Children: N/A  . Years of Education: N/A   Occupational History  . Not on file.   Social History Main Topics  . Smoking status: Current Every Day Smoker  . Smokeless tobacco: Not on file  . Alcohol Use: Yes  . Drug Use: Not on file  . Sexual Activity: Not on file   Other Topics Concern  . Not on file   Social History Narrative  . No narrative on file    No Known Allergies  Current Facility-Administered Medications  Medication Dose Route Frequency Provider Last Rate Last Dose  . 0.9 %  sodium chloride infusion  1 mL/kg/hr Intravenous Continuous Pamella Pert, MD      . 0.9 %  sodium chloride infusion  250 mL Intravenous PRN Pamella Pert, MD      . acetaminophen (TYLENOL) tablet 650 mg  650 mg Oral Q4H PRN Pamella Pert, MD      . Melene Muller ON 04/26/2014] aminocaproic acid (AMICAR)  10 g in sodium chloride 0.9 % 100 mL infusion   Intravenous To OR Pamella Pert, MD      . aspirin 81 MG chewable tablet           . aspirin EC tablet 81 mg  81 mg Oral Daily Pamella Pert, MD      . atorvastatin (LIPITOR) tablet 40 mg  40 mg Oral Daily Pamella Pert, MD      . Melene Muller ON 04/26/2014] cefUROXime (ZINACEF) 750 mg in dextrose 5 % 50 mL IVPB  750 mg Intravenous To OR Pamella Pert, MD      . cyclobenzaprine (FLEXERIL) tablet 10 mg  10 mg Oral BID PRN Pamella Pert, MD      . Melene Muller ON 04/26/2014] dexmedetomidine (PRECEDEX) 400 MCG/100ML (4 mcg/mL) infusion  0.1-0.7 mcg/kg/hr Intravenous To OR Pamella Pert, MD      . Melene Muller ON 04/26/2014] DOPamine (INTROPIN) 800 mg in dextrose 5 % 250 mL (3.2 mg/mL) infusion  2-20 mcg/kg/min Intravenous To OR Pamella Pert, MD      . Melene Muller ON 04/26/2014] EPINEPHrine (ADRENALIN) 4 mg in dextrose 5 % 250 mL (0.016 mg/mL) infusion  0.5-20 mcg/min Intravenous To OR Pamella Pert, MD      . Melene Muller ON 04/26/2014] heparin 2,500 Units, papaverine 30 mg in electrolyte-148 (PLASMALYTE-148) 500 mL irrigation   Irrigation  To OR Pamella Pert, MD      . Melene Muller ON 04/26/2014] heparin 30,000 units/NS 1000 mL solution for CELLSAVER   Other To OR Pamella Pert, MD      . HYDROcodone-acetaminophen (NORCO/VICODIN) 5-325 MG per tablet 1 tablet  1 tablet Oral Q4H PRN Pamella Pert, MD      . insulin aspart (novoLOG) injection 0-15 Units  0-15 Units Subcutaneous TID WC Pamella Pert, MD   5 Units at 04/25/14 1232  . insulin aspart (novoLOG) injection 4 Units  4 Units Subcutaneous TID WC Pamella Pert, MD      . insulin glargine (LANTUS) injection 37 Units  37 Units Subcutaneous QHS Pamella Pert, MD      . Melene Muller ON 04/26/2014] insulin regular (NOVOLIN R,HUMULIN R) 250 Units in sodium chloride 0.9 % 250 mL (1 Units/mL) infusion   Intravenous To OR Pamella Pert, MD      . losartan (COZAAR) tablet 25 mg  25 mg Oral Daily Pamella Pert, MD      . Melene Muller ON 04/26/2014] magnesium sulfate (IV Push/IM) injection 40 mEq  40 mEq Other To OR Pamella Pert, MD      . metoprolol tartrate (LOPRESSOR) tablet 25 mg  25 mg Oral BID Pamella Pert, MD      . Melene Muller ON 04/26/2014] nitroGLYCERIN 50 mg in dextrose 5 % 250 mL (0.2 mg/mL) infusion  2-200 mcg/min Intravenous To OR Pamella Pert, MD      . ondansetron [facility]) injection 4 mg  4 mg Intravenous Q6H PRN Pamella Pert, MD      . Melene Muller ON 04/26/2014] phenylephrine (NEO-SYNEPHRINE) 20 mg in dextrose 5 % 250 mL (0.08 mg/mL) infusion  30-200 mcg/min Intravenous To OR Pamella Pert, MD      . Melene Muller ON 04/26/2014] potassium chloride injection 80 mEq  80 mEq Other To OR Pamella Pert, MD      . sodium chloride 0.9 % injection 3 mL  3 mL Intravenous Q12H Pamella Pert, MD      .  sodium chloride 0.9 % injection 3 mL  3 mL Intravenous PRN Pamella Pert, MD      . tiotropium [facility]) inhalation capsule 18 mcg  18 mcg Inhalation Daily Pamella Pert, MD        Prescriptions prior to admission  Medication Sig Dispense Refill  .  aspirin 81 MG tablet Take 81 mg by mouth daily.      Marland Kitchen atorvastatin (LIPITOR) 40 MG tablet Take 40 mg by mouth daily.      . insulin glargine (LANTUS) 100 UNIT/ML injection Inject 36 Units into the skin at bedtime.      . insulin lispro (HUMALOG) 100 UNIT/ML injection Inject 4 Units into the skin 3 (three) times daily before meals.       Marland Kitchen losartan (COZAAR) 100 MG tablet Take 100 mg by mouth daily.      . metoprolol tartrate (LOPRESSOR) 25 MG tablet Take 25 mg by mouth 2 (two) times daily.      . cyclobenzaprine (FLEXERIL) 10 MG tablet Take 1 tablet (10 mg total) by mouth 2 (two) times daily as needed for muscle spasms.  20 tablet  0  . HYDROcodone-acetaminophen (NORCO/VICODIN) 5-325 MG per tablet Take 1 tablet by mouth every 4 (four) hours as needed for pain.  10 tablet  0  . tiotropium (SPIRIVA HANDIHALER) 18 MCG inhalation capsule Place 18 mcg into inhaler and inhale daily.        No family history on file.   Review of Systems:     Cardiac Review of Systems: Y or N  Chest Pain [  x]  Resting SOB [  n ] Exertional SOB  [ y ]  Orthopnea [ n   Pedal Edema [ n ]    Palpitations [ n ] Syncope  [n ]   Presyncope [ n  ]  General Review of Systems: [Y] = yes [  ]=no Constitional: recent weight change [  ]; anorexia [  ]; fatigue [ y ]; nausea [  ]; night sweats [  ]; fever [  ]; or chills [  ]                                                               Dental: poor dentition[  ]; Last Dentist visit:   Eye : blurred vision [  ]; diplopia [   ]; vision changes [  ];  Amaurosis fugax[  ]; Resp: cough [  ];  wheezing[  ];  hemoptysis[  ]; shortness of breath[  ]; paroxysmal nocturnal dyspnea[  ]; dyspnea on exertion[  ]; or orthopnea[  ];  GI:  gallstones[  ], vomiting[  ];  dysphagia[  ]; melena[  ];  hematochezia [  ]; heartburn[  ];   Hx of  Colonoscopy[  ]; GU: kidney stones [  ]; hematuria[  ];   dysuria [  ];  nocturia[  ];  history of     obstruction [  ]; urinary frequency [  ]              Skin: rash, swelling[  ];, hair loss[  ];  peripheral edema[  ];  or itching[  ]; Musculosketetal: myalgias[  ];  joint swelling[  ];  joint erythema[  ];  joint pain[  ];  back pain[  ];  Heme/Lymph: bruising[  ];  bleeding[  ];  anemia[  ];  Neuro: TIA[  ];  headaches[  ];  stroke[  ];  vertigo[  ];  seizures[  ];   paresthesias[  ];  difficulty walking[  ];  Psych:depression[  ]; anxiety[  ];  Endocrine: diabetes[  ];  thyroid dysfunction[  ];  Immunizations: Flu [  ]; Pneumococcal[  ];  Other:  Physical Exam: BP 104/60  Pulse 69  Temp(Src) 98.1 F (36.7 C) (Oral)  Resp 20  Ht  (1.727 m)  Wt 190 lb (86.183 kg)  BMI 28.90 kg/m2  SpO2 98%  General appearance: alert, cooperative and no distress Heart: regular rate and rhythm Lungs: clear to auscultation bilaterally Abdomen: soft, non-tender; bowel sounds normal; no masses,  no organomegaly Extremities: extremities normal, atraumatic, no cyanosis or edema Neuro: intact No carotid bruits. palpable dp and pt pulses   Diagnostic Studies & Laboratory data:     Recent Radiology Findings:   No results found.    Recent Lab Findings: Lab Results  Component Value Date   HGB 15.3 07/22/2007   GLUCOSE 48* 07/21/2007   NA 139 07/21/2007   K 4.6 07/21/2007   CL 103 07/21/2007   CREATININE 1.58* 07/21/2007   BUN 16 07/21/2007   CO2 29 07/21/2007   INR 1.01 04/25/2014   Cath: Hemodynamic data:  Left ventricular pressure was 89/11 with LVEDP of 18 mm mercury. Aortic pressure was 86/55 with a mean of 69 mm mercury. There was no pressure gradient across the aortic valve  Left ventricle: Performed in the RAO projection revealed LVEF of 55-60%. There was no significant MR. no significant wall motion abnormality.  Right coronary artery: Dominant. Proximal to mid segment shows a 30-40% stenosis followed by the midsegment at the origin of the RV branch showing 80% stenosis. Gives origin to large area of and large PDA, at the  bifurcation, just proximal, after PDA and PL branch, there is a 60-70% stenosis. Ostium of the PL shows a 50-60% stenosis. Midsegment of the PA has a 40-50% stenosis. Faint collaterals to the LAD are present.  Left main coronary artery is large and normal. His mild calcification is evident.  Circumflex coronary artery: There is moderate amount of calcification evident in the circumflex coronary artery in the proximal and midsegment. The circumflex coronary artery has a high-grade ulcerated 99% long segment stenosis extending from the proximal segment of the midsegment. The circumflex coronary artery gives origin to a large obtuse marginal 1. After the origin of the marginal 1, the circumflex coronary artery is subtotally occluded with TIMI 2 flow and continues as a large OM 2. Both OM 1 and OM 2 are good targets for bypass surgery.  LAD: Heavily calcified in the proximal segment. The proximal LAD shows a central 70% stenosis in the proximal and followed by origin of a large septal perforator and a large diagonal 1. The diagonal 1 ostium has a 95% stenosis followed by a tandem 80-90% stenosis in the proximal end. Distal vessel is relatively healthy and graft will. The LAD after the origin of this large D1 is subtotally occluded TIMI 2 flow in the vessel. Distal target in the LAD is graftable.  Left subclavian artery and LIMA: Widely patent next  Right subclavian artery and RIMA : Widely patent  Recommendation: Patient has extremely high risk coronary anatomy with high risk of sudden cardiac cath with all 3  vessels subtotally occluded that includes large D1 large LAD and circumflex coronary artery and has a high-grade mid RCA stenosis. There is TIMI 2 flow in the circumflex and LAD. Patient has been having chest pain even with minimal activities, unstable angina:. He'll be admitted to the hospital for consideration for CABG in the inpatient setting.     Chronic Kidney Disease   Stage I     GFR >90  Stage  II    GFR 60-89  Stage IIIA GFR 45-59  Stage IIIB GFR 30-44  Stage IV   GFR 15-29  Stage V    GFR  <15  Lab Results  Component Value Date   CREATININE 1.57* 04/25/2014   Estimated Creatinine Clearance: 60.1 ml/min (by C-G formula based on Cr of 1.57). Of note patient is right handed, but has had bilateral carpal tunnel release procedures as well as an ulnar nerve release in his left arm,  Assessment / Plan:    1. CAD- needs CABG 2. Nicotine abuse- PFTs ordered, patient will require education on smoking cessation 3. Insulin Dependent diabetes- Hgb A1c pending 4. HTN- continue home Cozaar, Lopressor 5. Chronic renal disease  Stage II to Stage IIIA  I heave recommended we proceed with CABG, with severe 3 vessel disease and DM CABG offers best relief of symptoms and preservation of lv function. With history of bilateral hand and elbow nerve surgery will not use radials. I have discussed with patient need to stop smoking.  The goals risks and alternatives of the planned surgical procedure CABG  have been discussed with the patient in detail. The risks of the procedure including death, infection, stroke, myocardial infarction, bleeding, blood transfusion have all been discussed specifically.  I have quoted [name] a 4 % of perioperative mortality and a complication rate as high as 20 %. The patient's questions have been answered.REINHARD SCHACK is willing  to proceed with the planned procedure.  Delight Ovens MD      [address] [place] [place] [address] Office [phone]   Beeper [phone]

## 2014-04-27 NOTE — Progress Notes (Signed)
CT Surgery PM rounds  Sedated on vent postop CABG Vent wean in progress Hemodynamics stable Postop labs reviewed

## 2014-04-27 NOTE — Interval H&P Note (Signed)
History and Physical Interval Note:  04/27/2014 7:00 AM  [name]  has presented today for surgery, with the diagnosis of CAD  The various methods of treatment have been discussed with the patient and family. After consideration of risks, benefits and other options for treatment, the patient has consented to  Procedure(s): CORONARY ARTERY BYPASS GRAFTING (CABG) (N/A) INTRAOPERATIVE TRANSESOPHAGEAL ECHOCARDIOGRAM (N/A) as a surgical intervention .  The patient's history has been reviewed, patient examined, no change in status, stable for surgery.  I have reviewed the patient's chart and labs.  Questions were answered to the patient's satisfaction.    The goals risks and alternatives of the planned surgical procedure CABG have been discussed with the patient in detail. The risks of the procedure including death, infection, stroke, myocardial infarction, bleeding, blood transfusion have all been discussed specifically. I have quoted [name] a 4 % of perioperative mortality and a complication rate as high as 20 %. The patient's questions have been answered.[name] is willing to proceed with the planned procedure.   [name]

## 2014-04-27 NOTE — Procedures (Signed)
Extubation Procedure Note  Patient Details:   Name: [name] DOB: September 03, 1965 MRN: [id number]   Airway Documentation:     Evaluation  O2 sats: stable throughout Complications: No apparent complications Patient did tolerate procedure well. Bilateral Breath Sounds: Clear   Yes  Extubated to 4l [place] per protocol.  Nif, VC within range.  Cuff leak positive.  No complications noted.  No stridor noted.  Will continue to monitor.  Lysbeth Penner [facility] 04/27/2014, 6:21 PM

## 2014-04-27 NOTE — Transfer of Care (Signed)
Immediate Anesthesia Transfer of Care Note  Patient: [name]  Procedure(s) Performed: Procedure(s): CORONARY ARTERY BYPASS GRAFTING on pump using left internal mammary artery to LAD coronary artery, right great saphenous vein graft to diagonal coronary artery with sequential to OM1 and circumflex coronary arteries. Right greater saphenous vein graft to posterior descending coronary artery.  (N/A) INTRAOPERATIVE TRANSESOPHAGEAL ECHOCARDIOGRAM (N/A) ENDOVEIN HARVEST OF GREATER SAPHENOUS VEIN (Right)  Patient Location: ICU  Anesthesia Type:General  Level of Consciousness: sedated  Airway & Oxygen Therapy: Patient remains intubated per anesthesia plan and Patient placed on Ventilator (see vital sign flow sheet for setting)  Post-op Assessment: Post -op Vital signs reviewed and stable and report to ICU RN Rexene Alberts, RN  Post vital signs: Reviewed and stable  Complications: No apparent anesthesia complications

## 2014-04-27 NOTE — Anesthesia Postprocedure Evaluation (Signed)
  Anesthesia Post-op Note  Patient: [name]  Procedure(s) Performed: Procedure(s): CORONARY ARTERY BYPASS GRAFTING on pump using left internal mammary artery to LAD coronary artery, right great saphenous vein graft to diagonal coronary artery with sequential to OM1 and circumflex coronary arteries. Right greater saphenous vein graft to posterior descending coronary artery.  (N/A) INTRAOPERATIVE TRANSESOPHAGEAL ECHOCARDIOGRAM (N/A) ENDOVEIN HARVEST OF GREATER SAPHENOUS VEIN (Right)  Patient Location: PACU and SICU  Anesthesia Type:General  Level of Consciousness: sedated  Airway and Oxygen Therapy: Patient remains intubated per anesthesia plan  Post-op Pain: mild  Post-op Assessment: Post-op Vital signs reviewed  Post-op Vital Signs: Reviewed  Last Vitals:  Filed Vitals:   04/27/14 0552  BP: 105/49  Pulse: 61  Temp: 36.7 C  Resp: 19    Complications: No apparent anesthesia complications

## 2014-04-27 NOTE — OR Nursing (Signed)
Second call to SICU 

## 2014-04-27 NOTE — Anesthesia Preprocedure Evaluation (Addendum)
Anesthesia Evaluation  Patient identified by MRN, date of birth, ID band Patient awake    Reviewed: Allergy & Precautions, H&P , NPO status , Patient's Chart, lab work & pertinent test results  Airway Mallampati: II      Dental   Pulmonary shortness of breath, asthma , Current Smoker,          Cardiovascular + angina + Past MI     Neuro/Psych    GI/Hepatic negative GI ROS, Neg liver ROS,   Endo/Other  diabetes  Renal/GU negative Renal ROS     Musculoskeletal   Abdominal   Peds  Hematology   Anesthesia Other Findings   Reproductive/Obstetrics                          Anesthesia Physical Anesthesia Plan  ASA: III  Anesthesia Plan:    Post-op Pain Management:    Induction: Intravenous  Airway Management Planned: Oral ETT  Additional Equipment:   Intra-op Plan:   Post-operative Plan: Post-operative intubation/ventilation  Informed Consent: I have reviewed the patients History and Physical, chart, labs and discussed the procedure including the risks, benefits and alternatives for the proposed anesthesia with the patient or authorized representative who has indicated his/her understanding and acceptance.   Dental advisory given  Plan Discussed with: CRNA and Anesthesiologist  Anesthesia Plan Comments:         Anesthesia Quick Evaluation

## 2014-04-27 NOTE — OR Nursing (Signed)
First call to SICU 

## 2014-04-27 NOTE — Brief Op Note (Signed)
04/25/2014 - 04/27/2014  12:25 PM  PATIENT:  [name]  48 y.o. male  PRE-OPERATIVE DIAGNOSIS:  Multivessel CAD  POST-OPERATIVE DIAGNOSIS:  Multivessel CAD  PROCEDURE:  INTRAOPERATIVE TRANSESOPHAGEAL ECHOCARDIOGRAM, CORONARY ARTERY BYPASS GRAFTING x 5 on pump using left internal mammary artery to LAD coronary artery, right great saphenous vein graft to diagonal coronary artery, SVG sequentially to OM1 and circumflex coronary arteries, and SVG to posterior descending coronary artery with ENDOVEIN HARVEST OF RIGHT GREATER SAPHENOUS VEIN   SURGEON:  Surgeon(s) and Role:    * Delight Ovens, MD - Primary  PHYSICIAN ASSISTANT: Doree Fudge PA-C  ANESTHESIA:   general  EBL:  Total I/O In: 3000 [I.V.:3000] Out: 1575 [Urine:1575]   DRAINS: Chest tubes placed in the mediastinal and pleural spaces   COUNTS CORRECT:  YES  DICTATION: .Dragon Dictation  PLAN OF CARE: Admit to inpatient   PATIENT DISPOSITION:  ICU - intubated and hemodynamically stable.   Delay start of Pharmacological VTE agent (>24hrs) due to surgical blood loss or risk of bleeding: yes  BASELINE WEIGHT: 86 kg

## 2014-04-27 NOTE — Progress Notes (Signed)
SGC looped back in PA per radiology report. Currently sitting at 61 cm. Pulled back to 51cm.

## 2014-04-27 NOTE — Progress Notes (Signed)
  Echocardiogram Echocardiogram Transesophageal has been performed.  Cathie Beams 04/27/2014, 8:13 AM

## 2014-04-28 ENCOUNTER — Inpatient Hospital Stay (HOSPITAL_COMMUNITY): Payer: 59

## 2014-04-28 ENCOUNTER — Encounter (HOSPITAL_COMMUNITY): Payer: Self-pay | Admitting: Cardiothoracic Surgery

## 2014-04-28 LAB — POCT I-STAT, CHEM 8
BUN: 21 mg/dL (ref 6–23)
Calcium, Ion: 1.01 mmol/L — ABNORMAL LOW (ref 1.12–1.23)
Chloride: 109 mEq/L (ref 96–112)
Creatinine, Ser: 1.8 mg/dL — ABNORMAL HIGH (ref 0.50–1.35)
Glucose, Bld: 121 mg/dL — ABNORMAL HIGH (ref 70–99)
HCT: 25 % — ABNORMAL LOW (ref 39.0–52.0)
Hemoglobin: 8.5 g/dL — ABNORMAL LOW (ref 13.0–17.0)
Potassium: 4.9 mEq/L (ref 3.7–5.3)
Sodium: 135 mEq/L — ABNORMAL LOW (ref 137–147)
TCO2: 22 mmol/L (ref 0–100)

## 2014-04-28 LAB — CREATININE, SERUM
Creatinine, Ser: 1.75 mg/dL — ABNORMAL HIGH (ref 0.50–1.35)
GFR calc Af Amer: 51 mL/min — ABNORMAL LOW (ref >90)
GFR calc non Af Amer: 44 mL/min — ABNORMAL LOW (ref >90)

## 2014-04-28 LAB — CBC
HCT: 29.8 % — ABNORMAL LOW (ref 39.0–52.0)
HCT: 25.6 % — ABNORMAL LOW (ref 39.0–52.0)
Hemoglobin: 10.2 g/dL — ABNORMAL LOW (ref 13.0–17.0)
Hemoglobin: 8.9 g/dL — ABNORMAL LOW (ref 13.0–17.0)
MCH: 31 pg (ref 26.0–34.0)
MCH: 31.3 pg (ref 26.0–34.0)
MCHC: 34.2 g/dL (ref 30.0–36.0)
MCHC: 34.8 g/dL (ref 30.0–36.0)
MCV: 90.6 fL (ref 78.0–100.0)
MCV: 90.1 fL (ref 78.0–100.0)
Platelets: 136 10*3/uL — ABNORMAL LOW (ref 150–400)
Platelets: 112 10*3/uL — ABNORMAL LOW (ref 150–400)
RBC: 3.29 MIL/uL — ABNORMAL LOW (ref 4.22–5.81)
RBC: 2.84 MIL/uL — ABNORMAL LOW (ref 4.22–5.81)
RDW: 13.1 % (ref 11.5–15.5)
RDW: 13.5 % (ref 11.5–15.5)
WBC: 21.5 10*3/uL — ABNORMAL HIGH (ref 4.0–10.5)
WBC: 19.1 10*3/uL — ABNORMAL HIGH (ref 4.0–10.5)

## 2014-04-28 LAB — BASIC METABOLIC PANEL
Anion gap: 11 (ref 5–15)
BUN: 16 mg/dL (ref 6–23)
CO2: 24 mEq/L (ref 19–32)
Calcium: 7.9 mg/dL — ABNORMAL LOW (ref 8.4–10.5)
Chloride: 109 mEq/L (ref 96–112)
Creatinine, Ser: 1.47 mg/dL — ABNORMAL HIGH (ref 0.50–1.35)
GFR calc Af Amer: 63 mL/min — ABNORMAL LOW (ref >90)
GFR calc non Af Amer: 54 mL/min — ABNORMAL LOW (ref >90)
Glucose, Bld: 116 mg/dL — ABNORMAL HIGH (ref 70–99)
Potassium: 5 mEq/L (ref 3.7–5.3)
Sodium: 144 mEq/L (ref 137–147)

## 2014-04-28 LAB — GLUCOSE, CAPILLARY
Glucose-Capillary: 101 mg/dL — ABNORMAL HIGH (ref 70–99)
Glucose-Capillary: 103 mg/dL — ABNORMAL HIGH (ref 70–99)
Glucose-Capillary: 103 mg/dL — ABNORMAL HIGH (ref 70–99)
Glucose-Capillary: 105 mg/dL — ABNORMAL HIGH (ref 70–99)
Glucose-Capillary: 117 mg/dL — ABNORMAL HIGH (ref 70–99)
Glucose-Capillary: 117 mg/dL — ABNORMAL HIGH (ref 70–99)
Glucose-Capillary: 121 mg/dL — ABNORMAL HIGH (ref 70–99)
Glucose-Capillary: 121 mg/dL — ABNORMAL HIGH (ref 70–99)
Glucose-Capillary: 127 mg/dL — ABNORMAL HIGH (ref 70–99)
Glucose-Capillary: 142 mg/dL — ABNORMAL HIGH (ref 70–99)
Glucose-Capillary: 157 mg/dL — ABNORMAL HIGH (ref 70–99)
Glucose-Capillary: 170 mg/dL — ABNORMAL HIGH (ref 70–99)
Glucose-Capillary: 181 mg/dL — ABNORMAL HIGH (ref 70–99)
Glucose-Capillary: 204 mg/dL — ABNORMAL HIGH (ref 70–99)
Glucose-Capillary: 206 mg/dL — ABNORMAL HIGH (ref 70–99)
Glucose-Capillary: 96 mg/dL (ref 70–99)
Glucose-Capillary: 96 mg/dL (ref 70–99)
Glucose-Capillary: 98 mg/dL (ref 70–99)
Glucose-Capillary: 99 mg/dL (ref 70–99)

## 2014-04-28 LAB — TROPONIN I
Troponin I: 5.27 ng/mL (ref <0.30)
Troponin I: 4.57 ng/mL (ref <0.30)
Troponin I: 3.51 ng/mL (ref <0.30)

## 2014-04-28 LAB — MAGNESIUM
Magnesium: 2.4 mg/dL (ref 1.5–2.5)
Magnesium: 2.3 mg/dL (ref 1.5–2.5)

## 2014-04-28 MED ORDER — INSULIN DETEMIR 100 UNIT/ML ~~LOC~~ SOLN
15.0000 [IU] | Freq: Every day | SUBCUTANEOUS | Status: DC
  Administered 2014-04-29 – 2014-05-01 (×3): 15 [IU] via SUBCUTANEOUS
  Filled 2014-04-28 (×3): qty 0.15

## 2014-04-28 MED ORDER — METOCLOPRAMIDE HCL 5 MG/ML IJ SOLN
10.0000 mg | Freq: Three times a day (TID) | INTRAMUSCULAR | Status: DC
  Administered 2014-04-28 (×2): 10 mg via INTRAVENOUS
  Filled 2014-04-28 (×2): qty 2

## 2014-04-28 MED ORDER — INSULIN DETEMIR 100 UNIT/ML ~~LOC~~ SOLN
15.0000 [IU] | Freq: Once | SUBCUTANEOUS | Status: AC
  Administered 2014-04-28: 15 [IU] via SUBCUTANEOUS
  Filled 2014-04-28: qty 0.15

## 2014-04-28 MED ORDER — GI COCKTAIL ~~LOC~~
30.0000 mL | Freq: Once | ORAL | Status: AC
  Administered 2014-04-28: 30 mL via ORAL
  Filled 2014-04-28: qty 30

## 2014-04-28 MED ORDER — INSULIN ASPART 100 UNIT/ML ~~LOC~~ SOLN
0.0000 [IU] | SUBCUTANEOUS | Status: DC
  Administered 2014-04-28: 1 [IU] via SUBCUTANEOUS
  Administered 2014-04-28 – 2014-04-29 (×4): 2 [IU] via SUBCUTANEOUS
  Administered 2014-04-29 (×2): 4 [IU] via SUBCUTANEOUS
  Administered 2014-04-29: 8 [IU] via SUBCUTANEOUS
  Administered 2014-04-29: 4 [IU] via SUBCUTANEOUS
  Administered 2014-04-30: 16 [IU] via SUBCUTANEOUS
  Administered 2014-04-30 (×2): 12 [IU] via SUBCUTANEOUS
  Administered 2014-05-01: 8 [IU] via SUBCUTANEOUS
  Administered 2014-05-01: 16 [IU] via SUBCUTANEOUS
  Administered 2014-05-01: 8 [IU] via SUBCUTANEOUS
  Administered 2014-05-01: 2 [IU] via SUBCUTANEOUS
  Administered 2014-05-01: 12 [IU] via SUBCUTANEOUS
  Administered 2014-05-02: 2 [IU] via SUBCUTANEOUS
  Administered 2014-05-02: 4 [IU] via SUBCUTANEOUS

## 2014-04-28 MED ORDER — PROMETHAZINE HCL 25 MG/ML IJ SOLN: 12.5000 mg | Freq: Four times a day (QID) | INTRAMUSCULAR | Status: DC | PRN

## 2014-04-28 MED FILL — Sodium Chloride IV Soln 0.9%: INTRAVENOUS | Qty: 2000 | Status: AC

## 2014-04-28 MED FILL — Potassium Chloride Inj 2 mEq/ML: INTRAVENOUS | Qty: 40 | Status: AC

## 2014-04-28 MED FILL — Electrolyte-R (PH 7.4) Solution: INTRAVENOUS | Qty: 4000 | Status: AC

## 2014-04-28 MED FILL — Sodium Bicarbonate IV Soln 8.4%: INTRAVENOUS | Qty: 50 | Status: AC

## 2014-04-28 MED FILL — Mannitol IV Soln 20%: INTRAVENOUS | Qty: 500 | Status: AC

## 2014-04-28 MED FILL — Heparin Sodium (Porcine) Inj 1000 Unit/ML: INTRAMUSCULAR | Qty: 10 | Status: AC

## 2014-04-28 MED FILL — Lidocaine HCl IV Inj 20 MG/ML: INTRAVENOUS | Qty: 5 | Status: AC

## 2014-04-28 MED FILL — Heparin Sodium (Porcine) Inj 1000 Unit/ML: INTRAMUSCULAR | Qty: 30 | Status: AC

## 2014-04-28 MED FILL — Magnesium Sulfate Inj 50%: INTRAMUSCULAR | Qty: 10 | Status: AC

## 2014-04-28 NOTE — Progress Notes (Signed)
MD Tyrone Sage made aware of critical value (positive troponin)- no new orders received. He was also made aware that patient was having anxiety coupled with SOB that is relieved by tripoding. No new orders- will monitor. VSS  [name]

## 2014-04-28 NOTE — Progress Notes (Signed)
Patient ID: [name], male   DOB: 08/29/1965, 48 y.o.   MRN: [id number] [facility] DAILY ICU PROGRESS NOTE                   [address]            [place] [address]          [phone]   1 Day Post-Op Procedure(s) (LRB): CORONARY ARTERY BYPASS GRAFTING on pump using left internal mammary artery to LAD coronary artery, right great saphenous vein graft to diagonal coronary artery with sequential to OM1 and circumflex coronary arteries. Right greater saphenous vein graft to posterior descending coronary artery.  (N/A) INTRAOPERATIVE TRANSESOPHAGEAL ECHOCARDIOGRAM (N/A) ENDOVEIN HARVEST OF GREATER SAPHENOUS VEIN (Right)  Total Length of Stay:  LOS: 3 days   Subjective: Neuro intact, extubated nausea  Objective: Vital signs in last 24 hours: Temp:  [97 F (36.1 C)-99 F (37.2 C)] 98.6 F (37 C) (09/25 0645) Pulse Rate:  [59-91] 88 (09/25 0645) Cardiac Rhythm:  [-] Normal sinus rhythm (09/25 0600) Resp:  [8-24] 13 (09/25 0645) BP: (79-113)/(50-72) 91/54 mmHg (09/25 0600) SpO2:  [95 %-100 %] 95 % (09/25 0645) Arterial Line BP: (81-143)/(39-71) 128/53 mmHg (09/25 0645) FiO2 (%):  [40 %-50 %] 50 % (09/24 1800) Weight:  [190 lb 0.6 oz (86.2 kg)-192 lb 10.9 oz (87.4 kg)] 192 lb 10.9 oz (87.4 kg) (09/25 0500)  Filed Weights   04/25/14 0650 04/27/14 1445 04/28/14 0500  Weight: 190 lb (86.183 kg) 190 lb 0.6 oz (86.2 kg) 192 lb 10.9 oz (87.4 kg)    Weight change:    Hemodynamic parameters for last 24 hours: PAP: (23-39)/(10-24) 34/14 mmHg CO:  [3.2 L/min-5.7 L/min] 4.1 L/min CI:  [1.6 L/min/m2-2.8 L/min/m2] 2.1 L/min/m2  Intake/Output from previous day: 09/24 0701 - 09/25 0700 In: 6772.5 [I.V.:5342.5; Blood:400; NG/GT:30; IV Piggyback:1000] Out: 5125 [Urine:3745; Blood:800; Chest Tube:580]  Intake/Output this shift:    Current Meds: Scheduled Meds: . acetaminophen  1,000 mg Oral 4 times per day   Or  . acetaminophen (TYLENOL) oral liquid 160 mg/5 mL   1,000 mg Per Tube 4 times per day  . antiseptic oral rinse  7 mL Mouth Rinse BID  . aspirin EC  325 mg Oral Daily   Or  . aspirin  324 mg Per Tube Daily  . atorvastatin  40 mg Oral Daily  . bisacodyl  10 mg Oral Daily   Or  . bisacodyl  10 mg Rectal Daily  . buPROPion  150 mg Oral BID  . cefUROXime (ZINACEF)  IV  1.5 g Intravenous Q12H  . chlorhexidine  15 mL Mouth Rinse BID  . docusate sodium  200 mg Oral Daily  . famotidine (PEPCID) IV  20 mg Intravenous Q12H  . insulin aspart  0-24 Units Subcutaneous 6 times per day  . insulin detemir  15 Units Subcutaneous Once  . [START ON 04/29/2014] insulin detemir  15 Units Subcutaneous Daily  . insulin regular  0-10 Units Intravenous TID WC  . metoCLOPramide (REGLAN) injection  10 mg Intravenous 3 times per day  . metoprolol tartrate  12.5 mg Oral BID   Or  . metoprolol tartrate  12.5 mg Per Tube BID  . multivitamin with minerals  1 tablet Oral Daily  . mupirocin ointment  1 application Nasal BID  . [START ON 04/29/2014] pantoprazole  40 mg Oral Daily  . sodium chloride  3 mL Intravenous Q12H  . tiotropium  18 mcg  Inhalation Daily   Continuous Infusions: . sodium chloride 20 mL/hr at 04/28/14 0600  . sodium chloride 20 mL/hr at 04/28/14 0600  . sodium chloride    . dexmedetomidine Stopped (04/27/14 1606)  . DOPamine 4 mcg/kg/min (04/28/14 0600)  . insulin (NOVOLIN-R) infusion 1.7 Units/hr (04/28/14 0600)  . lactated ringers 20 mL/hr at 04/28/14 0600  . nitroGLYCERIN Stopped (04/27/14 1445)  . phenylephrine (NEO-SYNEPHRINE) Adult infusion 30 mcg/min (04/28/14 0600)   PRN Meds:.albumin human, metoprolol, midazolam, morphine injection, ondansetron (ZOFRAN) IV, oxyCODONE, promethazine, sodium chloride  General appearance: alert and cooperative Neurologic: intact Heart: regular rate and rhythm, S1, S2 normal, no murmur, click, rub or gallop Lungs: clear to auscultation bilaterally Abdomen: soft, non-tender; bowel sounds normal; no  masses,  no organomegaly Extremities: extremities normal, atraumatic, no cyanosis or edema and Homans sign is negative, no sign of DVT Wound: sternum stable   Lab Results: CBC: Recent Labs  04/27/14 2000 04/28/14 0400  WBC 22.7* 21.5*  HGB 11.0* 10.2*  HCT 32.2* 29.8*  PLT 129* 136*   BMET:  Recent Labs  04/27/14 0558  04/27/14 1953 04/27/14 2000 04/28/14 0400  NA 139  < > 140  --  144  K 4.8  < > 5.0  --  5.0  CL 104  < > 109  --  109  CO2 28  --   --   --  24  GLUCOSE 236*  < > 101*  --  116*  BUN 14  < > 9  --  16  CREATININE 1.29  < > 1.10 1.15 1.47*  CALCIUM 8.8  --   --   --  7.9*  < > = values in this interval not displayed.  PT/INR:  Recent Labs  04/27/14 1445  LABPROT 16.7*  INR 1.35   Radiology: Dg Chest Portable 1 View  04/27/2014   CLINICAL DATA:  Status post cardial thoracic surgery.  EXAM: PORTABLE CHEST - 1 VIEW  COMPARISON:  April 25, 2014  FINDINGS: Patient status post prior median sternotomy. Endotracheal tube is seen in a good position. The distal tip of the endotracheal tube is 4.8 cm from carina. A Swan-Ganz catheter is identified with the distal tip coiled in the main pulmonary artery. Repositioning is recommended. Mediastinal drain, left chest tube are identified in good position. Nasogastric tube is noted with distal tip not included on the film but is at least in the stomach. There is atelectasis of the bilateral lungs. There is probably a small left pleural effusion. There is no pneumothorax.  IMPRESSION: Postoperative changes. Swan-Ganz catheter is identified with distal tip coiled in the main pulmonary artery. Repositioning is recommended.   Electronically Signed   By: Sherian Rein M.D.   On: 04/27/2014 15:11     Assessment/Plan: S/P Procedure(s) (LRB): CORONARY ARTERY BYPASS GRAFTING on pump using left internal mammary artery to LAD coronary artery, right great saphenous vein graft to diagonal coronary artery with sequential to OM1 and  circumflex coronary arteries. Right greater saphenous vein graft to posterior descending coronary artery.  (N/A) INTRAOPERATIVE TRANSESOPHAGEAL ECHOCARDIOGRAM (N/A) ENDOVEIN HARVEST OF GREATER SAPHENOUS VEIN (Right) Mobilize d/c tubes/lines Continue foley due to strict I&O See progression orders Expected Acute  Blood - loss Anemia Add levimir Mild diffuse st changes ? pericarditis follow up ekg and troponin ordered  Zoee Heeney B 04/28/2014 7:14 AM

## 2014-04-28 NOTE — Progress Notes (Signed)
Patient ID: [name], male   DOB: [birth year], 48 y.o.   MRN: [id number]  SICU Evening rounds:  BP has been on the low side this pm with oliguria and increase in creat to 1.8 this pm. This may be related to surgery and Toradol he received yesterday.   Will restart dopamine at 3 and follow urine output. May need some neo if dopamine does not raise BP. Hold off on beta blocker for now.

## 2014-04-28 NOTE — Progress Notes (Signed)
CRITICAL VALUE ALERT  Critical value received:  Troponin 5.17  Date of notification: 04/28/2014  Time of notification:  1100  Critical value read back:Yes.    Nurse who received alert:  Edison Pace   MD notified (1st page):  gerhardt  Time of first page:  1110      Responding MD:  gerhardt  Time MD responded:  1118

## 2014-04-28 NOTE — Progress Notes (Signed)
Utilization Review Completed Hanalei Glace J. Sherin Murdoch, RN, BSN, NCM [phone]  

## 2014-04-28 NOTE — Progress Notes (Signed)
EKG CRITICAL VALUE     12 lead EKG performed.  Critical value noted. Dr. Tyrone Sage , notified.   Lanae Boast, CCT 04/28/2014 7:33 AM

## 2014-04-29 ENCOUNTER — Inpatient Hospital Stay (HOSPITAL_COMMUNITY): Payer: 59

## 2014-04-29 LAB — CBC
HCT: 26 % — ABNORMAL LOW (ref 39.0–52.0)
Hemoglobin: 8.9 g/dL — ABNORMAL LOW (ref 13.0–17.0)
MCH: 32 pg (ref 26.0–34.0)
MCHC: 34.2 g/dL (ref 30.0–36.0)
MCV: 93.5 fL (ref 78.0–100.0)
Platelets: 117 10*3/uL — ABNORMAL LOW (ref 150–400)
RBC: 2.78 MIL/uL — ABNORMAL LOW (ref 4.22–5.81)
RDW: 13.8 % (ref 11.5–15.5)
WBC: 19.3 10*3/uL — ABNORMAL HIGH (ref 4.0–10.5)

## 2014-04-29 LAB — GLUCOSE, CAPILLARY
Glucose-Capillary: 120 mg/dL — ABNORMAL HIGH (ref 70–99)
Glucose-Capillary: 148 mg/dL — ABNORMAL HIGH (ref 70–99)
Glucose-Capillary: 130 mg/dL — ABNORMAL HIGH (ref 70–99)
Glucose-Capillary: 185 mg/dL — ABNORMAL HIGH (ref 70–99)
Glucose-Capillary: 193 mg/dL — ABNORMAL HIGH (ref 70–99)
Glucose-Capillary: 216 mg/dL — ABNORMAL HIGH (ref 70–99)
Glucose-Capillary: 165 mg/dL — ABNORMAL HIGH (ref 70–99)

## 2014-04-29 LAB — BASIC METABOLIC PANEL
Anion gap: 11 (ref 5–15)
BUN: 26 mg/dL — ABNORMAL HIGH (ref 6–23)
CO2: 24 mEq/L (ref 19–32)
Calcium: 7.8 mg/dL — ABNORMAL LOW (ref 8.4–10.5)
Chloride: 105 mEq/L (ref 96–112)
Creatinine, Ser: 0.74 mg/dL (ref 0.50–1.35)
GFR calc Af Amer: >90 mL/min (ref >90)
GFR calc non Af Amer: >90 mL/min (ref >90)
Glucose, Bld: 129 mg/dL — ABNORMAL HIGH (ref 70–99)
Potassium: 4.8 mEq/L (ref 3.7–5.3)
Sodium: 140 mEq/L (ref 137–147)

## 2014-04-29 NOTE — Progress Notes (Signed)
EKG CRITICAL VALUE     12 lead EKG performed.  Critical value noted.Linton Flemings, RN notified.   Jatziri Goffredo L, CCT 04/29/2014 9:11 AM

## 2014-04-29 NOTE — Progress Notes (Signed)
2 Days Post-Op Procedure(s) (LRB): CORONARY ARTERY BYPASS GRAFTING on pump using left internal mammary artery to LAD coronary artery, right great saphenous vein graft to diagonal coronary artery with sequential to OM1 and circumflex coronary arteries. Right greater saphenous vein graft to posterior descending coronary artery.  (N/A) INTRAOPERATIVE TRANSESOPHAGEAL ECHOCARDIOGRAM (N/A) ENDOVEIN HARVEST OF GREATER SAPHENOUS VEIN (Right) Subjective:  No complaints  Objective: Vital signs in last 24 hours: Temp:  [98.4 F (36.9 C)-99.6 F (37.6 C)] 98.4 F (36.9 C) (09/26 0700) Pulse Rate:  [87-151] 122 (09/26 1100) Cardiac Rhythm:  [-] Normal sinus rhythm (09/26 0800) Resp:  [11-20] 17 (09/26 1100) BP: (84-167)/(44-73) 167/73 mmHg (09/26 1100) SpO2:  [89 %-100 %] 93 % (09/26 1100) Weight:  [87.9 kg (193 lb 12.6 oz)] 87.9 kg (193 lb 12.6 oz) (09/26 0600)  Hemodynamic parameters for last 24 hours:    Intake/Output from previous day: 09/25 0701 - 09/26 0700 In: 1150.3 [P.O.:350; I.V.:650.3; IV Piggyback:150] Out: 1223 [Urine:858; Chest Tube:365] Intake/Output this shift: Total I/O In: 90 [I.V.:40; IV Piggyback:50] Out: 295 [Urine:225; Chest Tube:70]  General appearance: alert and cooperative Neurologic: intact Heart: regular rate and rhythm, S1, S2 normal, no murmur, click, rub or gallop Lungs: clear to auscultation bilaterally Extremities: edema mild Wound: incision ok  Lab Results:  Recent Labs  04/28/14 1700 04/28/14 1719 04/29/14 0500  WBC 19.1*  --  19.3*  HGB 8.9* 8.5* 8.9*  HCT 25.6* 25.0* 26.0*  PLT 112*  --  117*   BMET:  Recent Labs  04/28/14 0400  04/28/14 1719 04/29/14 0500  NA 144  --  135* 140  K 5.0  --  4.9 4.8  CL 109  --  109 105  CO2 24  --   --  24  GLUCOSE 116*  --  121* 129*  BUN 16  --  21 26*  CREATININE 1.47*  < > 1.80* 0.74  CALCIUM 7.9*  --   --  7.8*  < > = values in this interval not displayed.  PT/INR:  Recent Labs   04/27/14 1445  LABPROT 16.7*  INR 1.35   ABG    Component Value Date/Time   PHART 7.349* 04/27/2014 1948   HCO3 23.2 04/27/2014 1948   TCO2 22 04/28/2014 1719   ACIDBASEDEF 2.0 04/27/2014 1948   O2SAT 98.0 04/27/2014 1948   CBG (last 3)   Recent Labs  04/28/14 2016 04/29/14 0006 04/29/14 0407  GLUCAP 121* 148* 120*   CXR: ok  ECG: sinus tachy. Diffuse ST elevation that may be due to pericarditis.  Assessment/Plan: S/P Procedure(s) (LRB): CORONARY ARTERY BYPASS GRAFTING on pump using left internal mammary artery to LAD coronary artery, right great saphenous vein graft to diagonal coronary artery with sequential to OM1 and circumflex coronary arteries. Right greater saphenous vein graft to posterior descending coronary artery.  (N/A) INTRAOPERATIVE TRANSESOPHAGEAL ECHOCARDIOGRAM (N/A) ENDOVEIN HARVEST OF GREATER SAPHENOUS VEIN (Right)  He is hemodynamically stable  ST elevation in all leads and elevated troponin. This could be pericarditis. Troponin trend is downward.  Acute postop renal failure: creat back to normal. Wean off dopamine.  Expected acute blood loss anemia: stable  Mobilize, IS  Remove foley and sleeve  Chest tube output still a little high to remove.       LOS: 4 days    Evelene Croon K 04/29/2014

## 2014-04-29 NOTE — Progress Notes (Signed)
Patient ID: [name], male   DOB: 1966-06-04, 48 y.o.   MRN: [id number]  SICU Evening Rounds:  Hemodynamically stable off dopamine  Voiding urine ok  Stable day.

## 2014-04-30 ENCOUNTER — Inpatient Hospital Stay (HOSPITAL_COMMUNITY): Payer: 59

## 2014-04-30 LAB — GLUCOSE, CAPILLARY
Glucose-Capillary: 121 mg/dL — ABNORMAL HIGH (ref 70–99)
Glucose-Capillary: 90 mg/dL (ref 70–99)
Glucose-Capillary: 252 mg/dL — ABNORMAL HIGH (ref 70–99)

## 2014-04-30 LAB — BASIC METABOLIC PANEL
Anion gap: 10 (ref 5–15)
BUN: 28 mg/dL — ABNORMAL HIGH (ref 6–23)
CO2: 27 mEq/L (ref 19–32)
Calcium: 8.1 mg/dL — ABNORMAL LOW (ref 8.4–10.5)
Chloride: 102 mEq/L (ref 96–112)
Creatinine, Ser: 1.63 mg/dL — ABNORMAL HIGH (ref 0.50–1.35)
GFR calc Af Amer: 55 mL/min — ABNORMAL LOW (ref >90)
GFR calc non Af Amer: 48 mL/min — ABNORMAL LOW (ref >90)
Glucose, Bld: 67 mg/dL — ABNORMAL LOW (ref 70–99)
Potassium: 4.7 mEq/L (ref 3.7–5.3)
Sodium: 139 mEq/L (ref 137–147)

## 2014-04-30 LAB — CBC
HCT: 22.8 % — ABNORMAL LOW (ref 39.0–52.0)
Hemoglobin: 7.8 g/dL — ABNORMAL LOW (ref 13.0–17.0)
MCH: 31.1 pg (ref 26.0–34.0)
MCHC: 34.2 g/dL (ref 30.0–36.0)
MCV: 90.8 fL (ref 78.0–100.0)
Platelets: 115 10*3/uL — ABNORMAL LOW (ref 150–400)
RBC: 2.51 MIL/uL — ABNORMAL LOW (ref 4.22–5.81)
RDW: 13.7 % (ref 11.5–15.5)
WBC: 11.9 10*3/uL — ABNORMAL HIGH (ref 4.0–10.5)

## 2014-04-30 MED ORDER — FERROUS GLUCONATE 324 (38 FE) MG PO TABS
324.0000 mg | ORAL_TABLET | Freq: Two times a day (BID) | ORAL | Status: DC
  Administered 2014-04-30 – 2014-05-06 (×12): 324 mg via ORAL
  Filled 2014-04-30 (×14): qty 1

## 2014-04-30 NOTE — Progress Notes (Signed)
Patient ID: [name], male   DOB: 12-26-1965, 48 y.o.   MRN: [id number]  SICU Evening Rounds:  Hemodynamically stable  CT output 260 cc today  ambulated

## 2014-04-30 NOTE — Progress Notes (Signed)
3 Days Post-Op Procedure(s) (LRB): CORONARY ARTERY BYPASS GRAFTING on pump using left internal mammary artery to LAD coronary artery, right great saphenous vein graft to diagonal coronary artery with sequential to OM1 and circumflex coronary arteries. Right greater saphenous vein graft to posterior descending coronary artery.  (N/A) INTRAOPERATIVE TRANSESOPHAGEAL ECHOCARDIOGRAM (N/A) ENDOVEIN HARVEST OF GREATER SAPHENOUS VEIN (Right) Subjective: No complaints  Objective: Vital signs in last 24 hours: Temp:  [97.8 F (36.6 C)-99.5 F (37.5 C)] 97.8 F (36.6 C) (09/27 1200) Pulse Rate:  [88-128] 94 (09/27 1200) Cardiac Rhythm:  [-] Normal sinus rhythm (09/27 1200) Resp:  [13-24] 20 (09/27 1200) BP: (105-139)/(54-75) 127/63 mmHg (09/27 1200) SpO2:  [89 %-99 %] 94 % (09/27 1200) Weight:  [88.1 kg (194 lb 3.6 oz)] 88.1 kg (194 lb 3.6 oz) (09/27 0500)  Hemodynamic parameters for last 24 hours:    Intake/Output from previous day: 09/26 0701 - 09/27 0700 In: 180 [I.V.:130; IV Piggyback:50] Out: 1615 [Urine:1435; Chest Tube:180] Intake/Output this shift: Total I/O In: 10 [I.V.:10] Out: 190 [Chest Tube:190]  General appearance: alert and cooperative Neurologic: intact Heart: regular rate and rhythm, S1, S2 normal, no murmur, click, rub or gallop Lungs: clear to auscultation bilaterally Extremities: edema mild Wound: incisions ok  Lab Results:  Recent Labs  04/29/14 0500 04/30/14 0325  WBC 19.3* 11.9*  HGB 8.9* 7.8*  HCT 26.0* 22.8*  PLT 117* 115*   BMET:  Recent Labs  04/29/14 0500 04/30/14 0325  NA 140 139  K 4.8 4.7  CL 105 102  CO2 24 27  GLUCOSE 129* 67*  BUN 26* 28*  CREATININE 0.74 1.63*  CALCIUM 7.8* 8.1*    PT/INR:  Recent Labs  04/27/14 1445  LABPROT 16.7*  INR 1.35   ABG    Component Value Date/Time   PHART 7.349* 04/27/2014 1948   HCO3 23.2 04/27/2014 1948   TCO2 22 04/28/2014 1719   ACIDBASEDEF 2.0 04/27/2014 1948   O2SAT 98.0 04/27/2014 1948    CBG (last 3)   Recent Labs  04/29/14 1957 04/29/14 2345 04/30/14 0357  GLUCAP 193* 121* 90    Assessment/Plan: S/P Procedure(s) (LRB): CORONARY ARTERY BYPASS GRAFTING on pump using left internal mammary artery to LAD coronary artery, right great saphenous vein graft to diagonal coronary artery with sequential to OM1 and circumflex coronary arteries. Right greater saphenous vein graft to posterior descending coronary artery.  (N/A) INTRAOPERATIVE TRANSESOPHAGEAL ECHOCARDIOGRAM (N/A) ENDOVEIN HARVEST OF GREATER SAPHENOUS VEIN (Right) He remains hemodynamically stable.  Acute postop renal failure improving. The normal creat yesterday was probably lab error. I don't think her creat would go from 1.8 two days ago to 0.7 yesterday and then back up to 1.6 today. He continues to diurese well. Will repeat tomorrow.   Expected postop acute blood loss anemia: His Hgb is lower today but he is hemodynamically stable. Will repeat in am. Start Fe2+.  Chest tube output 190 cc since this am. Will keep in today on water seal.  Mobilize   LOS: 5 days    Evelene Croon K 04/30/2014

## 2014-05-01 LAB — CBC
HCT: 24 % — ABNORMAL LOW (ref 39.0–52.0)
Hemoglobin: 8.2 g/dL — ABNORMAL LOW (ref 13.0–17.0)
MCH: 31.2 pg (ref 26.0–34.0)
MCHC: 34.2 g/dL (ref 30.0–36.0)
MCV: 91.3 fL (ref 78.0–100.0)
Platelets: 182 10*3/uL (ref 150–400)
RBC: 2.63 MIL/uL — ABNORMAL LOW (ref 4.22–5.81)
RDW: 13.6 % (ref 11.5–15.5)
WBC: 14.4 10*3/uL — ABNORMAL HIGH (ref 4.0–10.5)

## 2014-05-01 LAB — GLUCOSE, CAPILLARY
Glucose-Capillary: 120 mg/dL — ABNORMAL HIGH (ref 70–99)
Glucose-Capillary: 146 mg/dL — ABNORMAL HIGH (ref 70–99)
Glucose-Capillary: 214 mg/dL — ABNORMAL HIGH (ref 70–99)
Glucose-Capillary: 217 mg/dL — ABNORMAL HIGH (ref 70–99)
Glucose-Capillary: 263 mg/dL — ABNORMAL HIGH (ref 70–99)
Glucose-Capillary: 298 mg/dL — ABNORMAL HIGH (ref 70–99)
Glucose-Capillary: 316 mg/dL — ABNORMAL HIGH (ref 70–99)
Glucose-Capillary: 50 mg/dL — ABNORMAL LOW (ref 70–99)
Glucose-Capillary: 60 mg/dL — ABNORMAL LOW (ref 70–99)
Glucose-Capillary: 90 mg/dL (ref 70–99)

## 2014-05-01 LAB — BASIC METABOLIC PANEL
Anion gap: 12 (ref 5–15)
BUN: 22 mg/dL (ref 6–23)
CO2: 26 mEq/L (ref 19–32)
Calcium: 8.2 mg/dL — ABNORMAL LOW (ref 8.4–10.5)
Chloride: 100 mEq/L (ref 96–112)
Creatinine, Ser: 1.36 mg/dL — ABNORMAL HIGH (ref 0.50–1.35)
GFR calc Af Amer: 69 mL/min — ABNORMAL LOW (ref >90)
GFR calc non Af Amer: 59 mL/min — ABNORMAL LOW (ref >90)
Glucose, Bld: 136 mg/dL — ABNORMAL HIGH (ref 70–99)
Potassium: 4.5 mEq/L (ref 3.7–5.3)
Sodium: 138 mEq/L (ref 137–147)

## 2014-05-01 MED ORDER — OXYCODONE HCL 5 MG PO TABS
5.0000 mg | ORAL_TABLET | ORAL | Status: DC | PRN
  Administered 2014-05-01 – 2014-05-02 (×2): 10 mg via ORAL
  Administered 2014-05-02: 5 mg via ORAL
  Administered 2014-05-03 – 2014-05-05 (×11): 10 mg via ORAL
  Administered 2014-05-05: 5 mg via ORAL
  Administered 2014-05-05: 10 mg via ORAL
  Filled 2014-05-01 (×14): qty 2
  Filled 2014-05-01: qty 1
  Filled 2014-05-01: qty 2
  Filled 2014-05-01: qty 1

## 2014-05-01 MED ORDER — BISACODYL 10 MG RE SUPP: 10.0000 mg | Freq: Every day | RECTAL | Status: DC | PRN

## 2014-05-01 MED ORDER — SODIUM CHLORIDE 0.9 % IJ SOLN
3.0000 mL | Freq: Two times a day (BID) | INTRAMUSCULAR | Status: DC
  Administered 2014-05-01 – 2014-05-06 (×8): 3 mL via INTRAVENOUS

## 2014-05-01 MED ORDER — METOPROLOL TARTRATE 12.5 MG HALF TABLET
12.5000 mg | ORAL_TABLET | Freq: Two times a day (BID) | ORAL | Status: DC
  Administered 2014-05-01: 12.5 mg via ORAL
  Filled 2014-05-01 (×3): qty 1

## 2014-05-01 MED ORDER — ASPIRIN EC 81 MG PO TBEC
81.0000 mg | DELAYED_RELEASE_TABLET | Freq: Every day | ORAL | Status: DC
  Administered 2014-05-02 – 2014-05-06 (×5): 81 mg via ORAL
  Filled 2014-05-01 (×6): qty 1

## 2014-05-01 MED ORDER — SODIUM CHLORIDE 0.9 % IV SOLN: 250.0000 mL | INTRAVENOUS | Status: DC | PRN

## 2014-05-01 MED ORDER — FUROSEMIDE 40 MG PO TABS
40.0000 mg | ORAL_TABLET | Freq: Every day | ORAL | Status: DC
  Administered 2014-05-01 – 2014-05-06 (×5): 40 mg via ORAL
  Filled 2014-05-01 (×6): qty 1

## 2014-05-01 MED ORDER — BISACODYL 5 MG PO TBEC
10.0000 mg | DELAYED_RELEASE_TABLET | Freq: Every day | ORAL | Status: DC | PRN
Start: 2014-05-01 — End: 2014-05-06
  Administered 2014-05-05: 10 mg via ORAL
  Filled 2014-05-01: qty 2

## 2014-05-01 MED ORDER — ENOXAPARIN SODIUM 30 MG/0.3ML ~~LOC~~ SOLN
30.0000 mg | SUBCUTANEOUS | Status: DC
  Administered 2014-05-01 – 2014-05-05 (×5): 30 mg via SUBCUTANEOUS
  Filled 2014-05-01 (×6): qty 0.3

## 2014-05-01 MED ORDER — MOVING RIGHT ALONG BOOK
Freq: Once | Status: AC
  Administered 2014-05-01: 18:00:00
  Filled 2014-05-01: qty 1

## 2014-05-01 MED ORDER — INSULIN ASPART 100 UNIT/ML ~~LOC~~ SOLN
0.0000 [IU] | Freq: Three times a day (TID) | SUBCUTANEOUS | Status: DC
  Administered 2014-05-02 – 2014-05-03 (×4): 12 [IU] via SUBCUTANEOUS
  Administered 2014-05-03: 8 [IU] via SUBCUTANEOUS

## 2014-05-01 MED ORDER — INSULIN DETEMIR 100 UNIT/ML ~~LOC~~ SOLN
8.0000 [IU] | Freq: Every day | SUBCUTANEOUS | Status: DC
  Administered 2014-05-02: 8 [IU] via SUBCUTANEOUS
  Filled 2014-05-01 (×2): qty 0.08

## 2014-05-01 MED ORDER — TRAMADOL HCL 50 MG PO TABS
50.0000 mg | ORAL_TABLET | ORAL | Status: DC | PRN
  Administered 2014-05-02: 100 mg via ORAL
  Filled 2014-05-01: qty 2

## 2014-05-01 MED ORDER — SODIUM CHLORIDE 0.9 % IJ SOLN: 3.0000 mL | INTRAMUSCULAR | Status: DC | PRN

## 2014-05-01 MED ORDER — DOCUSATE SODIUM 100 MG PO CAPS
200.0000 mg | ORAL_CAPSULE | Freq: Every day | ORAL | Status: DC
  Administered 2014-05-02 – 2014-05-06 (×5): 200 mg via ORAL
  Filled 2014-05-01 (×7): qty 2

## 2014-05-01 MED ORDER — PANTOPRAZOLE SODIUM 40 MG PO TBEC
40.0000 mg | DELAYED_RELEASE_TABLET | Freq: Every day | ORAL | Status: DC
  Administered 2014-05-02 – 2014-05-06 (×5): 40 mg via ORAL
  Filled 2014-05-01 (×5): qty 1

## 2014-05-01 NOTE — Progress Notes (Signed)
Patient transferred to Unit 2W room 24 from 2S room 8, after report called to receiving RN, [place].  All questions answered.  Patient ambulated to new room location on 4L [place] on tele monitor.  Safety measures maintained.  Patient's medications, chart, and personal belongings all sent with patient.  Keitha Butte, RN BSN

## 2014-05-01 NOTE — Progress Notes (Signed)
Capillary blood sugar 50 at 0440.  Gave patient 120 mL orange juice.  Rechecked CBG at 0500 and CBG increased to 60.  Gave patient 240 mL orange juice.  Rechecked CBG at 0530 and CBG reading increased to >120.  Will continue to monitor closely

## 2014-05-01 NOTE — Progress Notes (Signed)
Patient ID: [name], male   DOB: 07/26/1966, 48 y.o.   MRN: [id number] TCTS DAILY ICU PROGRESS NOTE                   [address]            [place] [address]          [phone]   4 Days Post-Op Procedure(s) (LRB): CORONARY ARTERY BYPASS GRAFTING on pump using left internal mammary artery to LAD coronary artery, right great saphenous vein graft to diagonal coronary artery with sequential to OM1 and circumflex coronary arteries. Right greater saphenous vein graft to posterior descending coronary artery.  (N/A) INTRAOPERATIVE TRANSESOPHAGEAL ECHOCARDIOGRAM (N/A) ENDOVEIN HARVEST OF GREATER SAPHENOUS VEIN (Right)  Total Length of Stay:  LOS: 6 days   Subjective: Feels better, saws left ear is sore  Objective: Vital signs in last 24 hours: Temp:  [97.8 F (36.6 C)-98.7 F (37.1 C)] 98.2 F (36.8 C) (09/28 0400) Pulse Rate:  [88-116] 96 (09/28 0700) Cardiac Rhythm:  [-] Normal sinus rhythm (09/28 0500) Resp:  [13-40] 18 (09/28 0600) BP: (102-152)/(45-71) 119/56 mmHg (09/28 0700) SpO2:  [88 %-100 %] 99 % (09/28 0700) Weight:  [193 lb 4.8 oz (87.68 kg)] 193 lb 4.8 oz (87.68 kg) (09/28 0500)  Filed Weights   04/29/14 0600 04/30/14 0500 05/01/14 0500  Weight: 193 lb 12.6 oz (87.9 kg) 194 lb 3.6 oz (88.1 kg) 193 lb 4.8 oz (87.68 kg)    Weight change: -14.8 oz (-0.42 kg)   Hemodynamic parameters for last 24 hours:    Intake/Output from previous day: 09/27 0701 - 09/28 0700 In: 1080 [P.O.:960; I.V.:120] Out: 1665 [Urine:1275; Chest Tube:390]  Intake/Output this shift:    Current Meds: Scheduled Meds: . acetaminophen  1,000 mg Oral 4 times per day   Or  . acetaminophen (TYLENOL) oral liquid 160 mg/5 mL  1,000 mg Per Tube 4 times per day  . aspirin EC  325 mg Oral Daily   Or  . aspirin  324 mg Per Tube Daily  . atorvastatin  40 mg Oral Daily  . bisacodyl  10 mg Oral Daily   Or  . bisacodyl  10 mg Rectal Daily  . buPROPion  150 mg Oral BID    . docusate sodium  200 mg Oral Daily  . ferrous gluconate  324 mg Oral BID WC  . insulin aspart  0-24 Units Subcutaneous 6 times per day  . insulin detemir  15 Units Subcutaneous Daily  . multivitamin with minerals  1 tablet Oral Daily  . pantoprazole  40 mg Oral Daily  . sodium chloride  3 mL Intravenous Q12H  . tiotropium  18 mcg Inhalation Daily   Continuous Infusions: . sodium chloride 20 mL/hr at 04/28/14 0700  . sodium chloride 10 mL/hr (04/29/14 1320)  . sodium chloride    . DOPamine Stopped (04/29/14 1320)  . lactated ringers 10 mL/hr at 04/29/14 0700  . nitroGLYCERIN Stopped (04/27/14 1445)   PRN Meds:.morphine injection, ondansetron (ZOFRAN) IV, oxyCODONE, promethazine, sodium chloride  General appearance: alert and cooperative Neurologic: intact Heart: regular rate and rhythm, S1, S2 normal, no murmur, click, rub or gallop Lungs: clear to auscultation bilaterally Abdomen: soft, non-tender; bowel sounds normal; no masses,  no organomegaly Extremities: extremities normal, atraumatic, no cyanosis or edema and Homans sign is negative, no sign of DVT Wound: sternum stable  Lab Results: CBC: Recent Labs  04/30/14 0325 05/01/14 0520  WBC 11.9* 14.4*  HGB 7.8* 8.2*  HCT 22.8* 24.0*  PLT 115* 182   BMET:  Recent Labs  04/30/14 0325 05/01/14 0520  NA 139 138  K 4.7 4.5  CL 102 100  CO2 27 26  GLUCOSE 67* 136*  BUN 28* 22  CREATININE 1.63* 1.36*  CALCIUM 8.1* 8.2*    Lab Results  Component Value Date   TROPONINI 3.51* 04/28/2014     PT/INR: No results found for this basename: LABPROT, INR,  in the last 72 hours Radiology: Dg Chest Port 1 View  04/30/2014   CLINICAL DATA:  Post CABG, chest tube  EXAM: PORTABLE CHEST - 1 VIEW  COMPARISON:  Portable exam 0713 hr compared to 04/29/2014  FINDINGS: LEFT thoracostomy tube.  Upper normal heart size post CABG.  Perihilar infiltrates likely mild edema.  No gross pleural effusion or pneumothorax.  Bones unremarkable.   IMPRESSION: Stable LEFT thoracostomy tube without pneumothorax.  Mild perihilar infiltrates likely edema.   Electronically Signed   By: Ulyses Southward M.D.   On: 04/30/2014 09:31   Chronic Kidney Disease   Stage I     GFR >90  Stage II    GFR 60-89  Stage IIIA GFR 45-59  Stage IIIB GFR 30-44  Stage IV   GFR 15-29  Stage V    GFR  <15  Lab Results  Component Value Date   CREATININE 1.36* 05/01/2014   Estimated Creatinine Clearance: 69.9 ml/min (by C-G formula based on Cr of 1.36).   Assessment/Plan: S/P Procedure(s) (LRB): CORONARY ARTERY BYPASS GRAFTING on pump using left internal mammary artery to LAD coronary artery, right great saphenous vein graft to diagonal coronary artery with sequential to OM1 and circumflex coronary arteries. Right greater saphenous vein graft to posterior descending coronary artery.  (N/A) INTRAOPERATIVE TRANSESOPHAGEAL ECHOCARDIOGRAM (N/A) ENDOVEIN HARVEST OF GREATER SAPHENOUS VEIN (Right) Mobilize Diuresis Diabetes control d/c tubes/lines Plan for transfer to step-down: see transfer orders pre op chronic kidney disease remains at base line does not have acute renal failure    Arles Rumbold B 05/01/2014 7:51 AM

## 2014-05-01 NOTE — Progress Notes (Signed)
Inpatient Diabetes Program Recommendations  AACE/ADA: New Consensus Statement on Inpatient Glycemic Control (2013)  Target Ranges:  Prepandial:   less than 140 mg/dL      Peak postprandial:   less than 180 mg/dL (1-2 hours)      Critically ill patients:  140 - 180 mg/dL   Reason for Assessment: Swings in blood glucose  Diabetes history: Insulin-requiring diabetes Outpatient Diabetes medications: Lantus 37 units at HS, Humalog tid per correction scale Current orders for Inpatient glycemic control: Levemir decreased to 8 units daily starting tomorrow-  had already received 15 units, TCTS correction scale q 4 hours  Results for [name], [name] (MRN [id number]) as of 05/01/2014 14:01  Ref. Range 04/30/2014 08:15 04/30/2014 12:20 04/30/2014 16:56 04/30/2014 20:02 05/01/2014 00:15 05/01/2014 04:31 05/01/2014 04:58 05/01/2014 05:31 05/01/2014 12:03  Glucose-Capillary Latest Range: 70-99 mg/dL 90 175 (H) 102 (H) 585 (H) 120 (H) 50 (L) 60 (L) 146 (H) 298 (H)   Note:  CBG pattern indicates that patient would benefit would benefit from meal coverage.  Takes 4 units meal coverage at home.  The receiving of correction during the night may be contributing to lows early am.  Request MD consider:  Change CBG's to ac and HS with correction tid.  At HS use HS scale.  Add Novolog 3 or 4 units tid with meals as meal coverage-- to be given if patient eats at least 50% and CBG is at least 80 mg/dl Thank you.  Sheril Hammond S. Elsie Lincoln, RN, CNS, CDE Inpatient Diabetes Program, team pager [phone]

## 2014-05-01 NOTE — Op Note (Signed)
NAMEJAMICHEAL, HEARD              ACCOUNT NO.:  [id number]  MEDICAL RECORD NO.:  [id number]  LOCATION:  2S08C                        FACILITY:  [facility]  PHYSICIAN:  Sheliah Plane, MD    DATE OF BIRTH:  04/16/1966  DATE OF PROCEDURE:  04/27/2014 DATE OF DISCHARGE:                              OPERATIVE REPORT   PREOPERATIVE DIAGNOSIS:  Coronary occlusive disease with unstable angina.  POSTOPERATIVE DIAGNOSIS:  Coronary occlusive disease with unstable angina.  SURGICAL PROCEDURE:  Coronary artery bypass grafting x5 with the left internal mammary to the left anterior descending coronary artery, reverse saphenous vein graft to the diagonal coronary artery, sequential reverse saphenous vein graft to the first obtuse marginal and distal circumflex, reverse saphenous vein graft to the mid posterior descending coronary artery with right thigh and calf endo vein harvesting.  SURGEON:  Sheliah Plane, MD  FIRST ASSISTANT:  Lin Landsman, PA.  BRIEF HISTORY:  The patient is a 48 year old diabetic male who has a long history of smoking presents with new onset of chest pain.  Cardiac stress test was reported as positive leading to the patient's cardiac catheterization by Dr. Jacinto Halim.  Cardiac catheterization showed significant three-vessel coronary artery disease including 80% LAD, 80% diagonal, proximal circumflex 80%, after the takeoff of the first obtuse marginal 95%, and total occlusion of the right coronary artery.  Overall ventricular function was preserved.  Because of the patient's symptoms and significant three-vessel disease, coronary artery bypass grafting was recommended.  The patient agreed and signed informed consent.  He previously had procedures done on his right elbow and both wrists bilaterally, so radial arteries were not used.  DESCRIPTION OF PROCEDURE:  With Swan-Ganz and arterial line monitors in place, the patient underwent general endotracheal anesthesia  without incident.  Skin of chest and legs were prepped with Betadine and draped in usual sterile manner.  Using the Guidant endo vein harvesting system, vein was harvested from the right thigh and calf and was of good quality and caliber.  Median sternotomy was performed.  The left internal mammary artery was dissected down as a pedicle graft.  The distal artery was divided and had good free flow.  Pericardium was opened.  Overall ventricular function appeared preserved.  The patient was systemically heparinized.  Ascending aorta was cannulated.  The right atrium was cannulated and aortic root vent cardioplegia needle was introduced into the ascending aorta.  The patient was placed on cardiopulmonary bypass 2.4 L/min/m2.  Sites anastomosed were selected and dissected out of the epicardium.  The patient's body temperature was cooled to 32 degrees. Aortic crossclamp was applied.  600 mL of cold blood potassium cardioplegia was administered with diastolic arrest of the heart. Myocardial septal temperature was monitored throughout the crossclamp period.  Attention was turned first to the posterior descending coronary artery.  The right coronary artery was severely calcified in the midportion of the posterior descending coronary artery and the vessel. Had a soft area in the anterior wall and was able to be opened.  An 1-mm probe was passed distally.  The vessel was approximately 1.3 mm in size. Using a running 7-0 Prolene, a segment of reverse saphenous vein graft was anastomosed to the  mid posterior descending coronary artery. Additional cold blood cardioplegia was administered down the vessel. Attention was then turned to the lateral wall.  The heart was elevated. The first obtuse marginal vessel was opened, was a very thin-walled 1.2 mm in size.  Using a diamond-type side-to-side anastomosis, a running 8- 0 Prolene was used to anastomose segment of reverse saphenous vein graft side-to-side.   The distal extent of the same vein was then carried to the slightly larger distal circumflex approximately 1.5 mm in size. Also using a running 8-0 Prolene, distal anastomosis was performed. Additional blood cardioplegia was administered down the vein grafts. Attention was then turned to the diagonal coronary artery which was opened and admitted a 1.5 mm probe distally.  The vessel was very diffusely diseased.  Using a running 7-0 Prolene, distal anastomosis was performed with a segment of reverse saphenous vein graft.  Attention was then turned to the left anterior descending coronary artery.  This vessel was slightly small and diffusely diseased.  Using a running 8-0 Prolene, the left internal mammary artery was anastomosed to the left anterior descending coronary artery.  With release of the bulldog on the mammary artery, there was rise in myocardial septal temperature. Bulldog was placed back on the mammary artery.  Attention was then turned to the proximal anastomoses with the aortic cross-clamp still in place.  Three punch aortotomies were performed and each of the 3 vein grafts were anastomosed to the ascending aorta.  Air was evacuated from grafts and aortic cross-clamp was removed with a total crossclamp time of 115 minutes.  The bulldog had been removed from the mammary artery prior to clot crossclamp removed with prompt rise in myocardial septal temperature.  The patient spontaneously converted to a sinus rhythm with a slow atrial rate.  Atrial and ventricular pacing wires were applied. The patient's body temperature rewarmed to 37 degrees.  He was then ventilated and weaned from cardiopulmonary bypass without difficulty. He remained hemodynamically stable.  He was decannulated in usual fashion.  Protamine sulfate was administered with operative field hemostatic.  A left pleural tube and a Blake mediastinal drain were left in place.  Pericardium was loosely reapproximated.   Sternum was closed with #6 stainless steel wire.  Fascia was closed with interrupted 0 Vicryl, running 3-0 Vicryl in subcutaneous tissue, and 4-0 subcuticular stitch in skin edges.  Dry dressings were applied.  Sponge and needle count was reported as correct at completion of the procedure.  The patient tolerated the procedure without obvious complication and was transferred to the Surgical Intensive Care Unit for further postop care.  Total pump time was 146 minutes.  The patient did not require any blood bank, blood products during the operative procedure.     Sheliah Plane, MD     EG/MEDQ  D:  04/30/2014  T:  05/01/2014  Job:  [id number]

## 2014-05-02 ENCOUNTER — Inpatient Hospital Stay (HOSPITAL_COMMUNITY): Payer: 59

## 2014-05-02 LAB — CBC
HCT: 22 % — ABNORMAL LOW (ref 39.0–52.0)
Hemoglobin: 7.5 g/dL — ABNORMAL LOW (ref 13.0–17.0)
MCH: 31 pg (ref 26.0–34.0)
MCHC: 34.1 g/dL (ref 30.0–36.0)
MCV: 90.9 fL (ref 78.0–100.0)
Platelets: 211 10*3/uL (ref 150–400)
RBC: 2.42 MIL/uL — ABNORMAL LOW (ref 4.22–5.81)
RDW: 13.5 % (ref 11.5–15.5)
WBC: 10.8 10*3/uL — ABNORMAL HIGH (ref 4.0–10.5)

## 2014-05-02 LAB — BASIC METABOLIC PANEL
Anion gap: 13 (ref 5–15)
BUN: 17 mg/dL (ref 6–23)
CO2: 25 mEq/L (ref 19–32)
Calcium: 7.9 mg/dL — ABNORMAL LOW (ref 8.4–10.5)
Chloride: 97 mEq/L (ref 96–112)
Creatinine, Ser: 1.17 mg/dL (ref 0.50–1.35)
GFR calc Af Amer: 82 mL/min — ABNORMAL LOW (ref >90)
GFR calc non Af Amer: 71 mL/min — ABNORMAL LOW (ref >90)
Glucose, Bld: 118 mg/dL — ABNORMAL HIGH (ref 70–99)
Potassium: 4.1 mEq/L (ref 3.7–5.3)
Sodium: 135 mEq/L — ABNORMAL LOW (ref 137–147)

## 2014-05-02 LAB — GLUCOSE, CAPILLARY
Glucose-Capillary: 137 mg/dL — ABNORMAL HIGH (ref 70–99)
Glucose-Capillary: 160 mg/dL — ABNORMAL HIGH (ref 70–99)
Glucose-Capillary: 161 mg/dL — ABNORMAL HIGH (ref 70–99)
Glucose-Capillary: 218 mg/dL — ABNORMAL HIGH (ref 70–99)
Glucose-Capillary: 294 mg/dL — ABNORMAL HIGH (ref 70–99)
Glucose-Capillary: 298 mg/dL — ABNORMAL HIGH (ref 70–99)
Glucose-Capillary: 342 mg/dL — ABNORMAL HIGH (ref 70–99)

## 2014-05-02 MED ORDER — FOLIC ACID 1 MG PO TABS
1.0000 mg | ORAL_TABLET | Freq: Every day | ORAL | Status: DC
Start: 2014-05-02 — End: 2014-05-06
  Administered 2014-05-02 – 2014-05-06 (×5): 1 mg via ORAL
  Filled 2014-05-02 (×6): qty 1

## 2014-05-02 MED ORDER — FUROSEMIDE 10 MG/ML IJ SOLN
40.0000 mg | Freq: Once | INTRAMUSCULAR | Status: AC
  Administered 2014-05-02: 40 mg via INTRAVENOUS
  Filled 2014-05-02: qty 4

## 2014-05-02 MED ORDER — GUAIFENESIN 100 MG/5ML PO SOLN
5.0000 mL | ORAL | Status: DC | PRN
  Administered 2014-05-04 – 2014-05-05 (×3): 100 mg via ORAL
  Filled 2014-05-02 (×4): qty 5

## 2014-05-02 MED ORDER — POTASSIUM CHLORIDE CRYS ER 20 MEQ PO TBCR
20.0000 meq | EXTENDED_RELEASE_TABLET | Freq: Every day | ORAL | Status: DC
  Administered 2014-05-02 – 2014-05-06 (×5): 20 meq via ORAL
  Filled 2014-05-02 (×5): qty 1

## 2014-05-02 MED ORDER — LEVALBUTEROL HCL 0.63 MG/3ML IN NEBU
0.6300 mg | INHALATION_SOLUTION | Freq: Four times a day (QID) | RESPIRATORY_TRACT | Status: DC | PRN
  Administered 2014-05-02 – 2014-05-04 (×6): 0.63 mg via RESPIRATORY_TRACT
  Filled 2014-05-02 (×3): qty 3

## 2014-05-02 MED ORDER — INSULIN ASPART 100 UNIT/ML ~~LOC~~ SOLN: 0.0000 [IU] | SUBCUTANEOUS | Status: DC

## 2014-05-02 MED ORDER — METOPROLOL TARTRATE 25 MG PO TABS
25.0000 mg | ORAL_TABLET | Freq: Two times a day (BID) | ORAL | Status: DC
  Administered 2014-05-02 – 2014-05-06 (×9): 25 mg via ORAL
  Filled 2014-05-02 (×11): qty 1

## 2014-05-02 NOTE — Progress Notes (Deleted)
CARDIAC REHAB PHASE I   PRE:  Rate/Rhythm: 74 SR with PAC's  BP:  Supine:   Sitting: 109/54  Standing:    SaO2: 94 RA  MODE:  Ambulation: 550 ft   POST:  Rate/Rhythm: 103  BP:  Supine:   Sitting: 119/49  Standing:    SaO2: 93 RA [id number] Assisted X 1 and used walker to ambulate. Gait steady with walker. Pt able to walk 550 feet without c/o. Pt to bed after walk with call light in reach and family present. We discussed sternal precautions with getting in and out of chair and bed. Discussed with pt and family to view surgery discharge video, they plan to watch it  this afternoon.  Damia Bobrowski RN 05/02/2014 11:44 AM    

## 2014-05-02 NOTE — Progress Notes (Addendum)
      [address]       [place] [address]             [phone]        5 Days Post-Op Procedure(s) (LRB): CORONARY ARTERY BYPASS GRAFTING on pump using left internal mammary artery to LAD coronary artery, right great saphenous vein graft to diagonal coronary artery with sequential to OM1 and circumflex coronary arteries. Right greater saphenous vein graft to posterior descending coronary artery.  (N/A) INTRAOPERATIVE TRANSESOPHAGEAL ECHOCARDIOGRAM (N/A) ENDOVEIN HARVEST OF GREATER SAPHENOUS VEIN (Right)  Subjective: Patient with sternal, incisional pain and cough (mostly non productive).  Objective: Vital signs in last 24 hours: Temp:  [98.3 F (36.8 C)-98.9 F (37.2 C)] 98.9 F (37.2 C) (09/29 0415) Pulse Rate:  [88-105] 90 (09/29 0415) Cardiac Rhythm:  [-] Sinus tachycardia (09/28 2108) Resp:  [12-24] 21 (09/29 0415) BP: (99-137)/(55-79) 120/58 mmHg (09/29 0415) SpO2:  [96 %-100 %] 98 % (09/29 0515) Weight:  [191 lb 11.2 oz (86.955 kg)] 191 lb 11.2 oz (86.955 kg) (09/29 0415)  Pre op weight 86 kg Current Weight  05/02/14 191 lb 11.2 oz (86.955 kg)      Intake/Output from previous day: 09/28 0701 - 09/29 0700 In: 840 [P.O.:840] Out: 1230 [Urine:1200; Chest Tube:30]   Physical Exam:  Cardiovascular: RRR, no murmurs, gallops, or rubs. Pulmonary:Diminshed at bases bilaterally R>L; no rales, wheezes, or rhonchi. Abdomen: Soft, non tender, bowel sounds present. Extremities: Mild bilateral lower extremity edema. Wounds: Clean and dry.  No erythema or signs of infection.  Lab Results: CBC: Recent Labs  05/01/14 0520 05/02/14 0411  WBC 14.4* 10.8*  HGB 8.2* 7.5*  HCT 24.0* 22.0*  PLT 182 211   BMET:  Recent Labs  05/01/14 0520 05/02/14 0411  NA 138 135*  K 4.5 4.1  CL 100 97  CO2 26 25  GLUCOSE 136* 118*  BUN 22 17  CREATININE 1.36* 1.17  CALCIUM 8.2* 7.9*    PT/INR:  Lab Results  Component Value Date   INR 1.35 04/27/2014   INR 0.99 04/25/2014   INR 1.01 04/25/2014   ABG:  INR: Will add last result for INR, ABG once components are confirmed Will add last 4 CBG results once components are confirmed  Assessment/Plan:  1. CV - SR in the 90's. On Lopressor 12.5 bid. Will increase to 25 bid. Start ACEARB when BP allows. 2.  Pulmonary - On 2 liters of oxygen via [place]-wean as tolerates. CXR this am shows small left pneumothorax, small left pleural effusion and larger right pleural effusion. Encourage incentive spirometer and flutter valve. 3. Volume Overload - On Lasix 40 daily 4.  Acute blood loss anemia - H and H down to 7.5 and 22. Continue Fergon and start folic acid. 5. DM-CBGs 161/137/160. Pre op HGA1C 7.4. Continue Insulin. 6. Remove EPW in am 7. Robitussin PR 8. Regarding pain control, Oxy and Ultram ordered. Will not give Toradol yet. Creatinine slightly up to 1.17 9.Robitussin PRN cough  ZIMMERMAN,DONIELLE MPA-C 05/02/2014,8:59 AM  Cr improved, base line  Try to avoid transfusion I have seen and examined [name] and agree with the above assessment  and plan.  Delight Ovens MD Beeper [phone] Office [phone] 05/02/2014 9:50 AM

## 2014-05-02 NOTE — Progress Notes (Signed)
Nursing received a call from Dr. Deitra Mayo (radiologist) with results of chest xray. 1-2% tiny pneumothorax on left side. Made Doree Fudge PA aware. No new orders received.

## 2014-05-02 NOTE — Discharge Summary (Signed)
Physician Discharge Summary       [address] [place]       [place] [address]             [phone]    Patient ID: [name] MRN: [id number] DOB/AGE: 1966/07/04 48 y.o.  Admit date: 04/25/2014 Discharge date: 05/05/2014  Admission Diagnoses: 1. Multivessel CAD 2. History of DM 3. History of tobacco abuse  Discharge Diagnoses:  1. Multivessel CAD 2. History of DM 3. History of tobacco abuse 4. ABL anemia   Procedure (s):  1. Cardiac catheterization done by Dr. Jacinto Halim on 04/25/2014: Left ventricular pressure was 89/11 with LVEDP of 18 mm mercury. Aortic pressure was 86/55 with a mean of 69 mm mercury. There was no pressure gradient across the aortic valve  Left ventricle: Performed in the RAO projection revealed LVEF of 55-60%. There was no significant MR. no significant wall motion abnormality.  Right coronary artery: Dominant. Proximal to mid segment shows a 30-40% stenosis followed by the midsegment at the origin of the RV branch showing 80% stenosis. Gives origin to large area of and large PDA, at the bifurcation, just proximal, after PDA and PL branch, there is a 60-70% stenosis. Ostium of the PL shows a 50-60% stenosis. Midsegment of the PA has a 40-50% stenosis. Faint collaterals to the LAD are present.  Left main coronary artery is large and normal. His mild calcification is evident.  Circumflex coronary artery: There is moderate amount of calcification evident in the circumflex coronary artery in the proximal and midsegment. The circumflex coronary artery has a high-grade ulcerated 99% long segment stenosis extending from the proximal segment of the midsegment. The circumflex coronary artery gives origin to a large obtuse marginal 1. After the origin of the marginal 1, the circumflex coronary artery is subtotally occluded with TIMI 2 flow and continues as a large OM 2. Both OM 1 and OM 2 are good targets for bypass surgery.  LAD: Heavily calcified in the  proximal segment. The proximal LAD shows a central 70% stenosis in the proximal and followed by origin of a large septal perforator and a large diagonal 1. The diagonal 1 ostium has a 95% stenosis followed by a tandem 80-90% stenosis in the proximal end. Distal vessel is relatively healthy and graft will. The LAD after the origin of this large D1 is subtotally occluded TIMI 2 flow in the vessel. Distal target in the LAD is graftable.  Left subclavian artery and LIMA: Widely patent next  Right subclavian artery and RIMA : Widely patent  Recommendation: Patient has extremely high risk coronary anatomy with high risk of sudden cardiac cath with all 3 vessels subtotally occluded that includes large D1 large LAD and circumflex coronary artery and has a high-grade mid RCA stenosis. There is TIMI 2 flow in the circumflex and LAD. Patient has been having chest pain even with minimal activities, unstable angina:. He'll be admitted to the hospital for consideration for CABG in the inpatient setting.   2. Coronary artery bypass grafting x5 with the left  internal mammary to the left anterior descending coronary artery, reverse saphenous vein graft to the diagonal coronary artery, sequential reverse saphenous vein graft to the first obtuse marginal and distal circumflex, reverse saphenous vein graft to the mid posterior descending  coronary artery with right thigh and calf endo vein harvesting.   History of Presenting Illness: This is 48 yo [place] Male who presented today for cardiac catheterization. The patient was evaluated by Dr.  Ganji for chest pain and dyspnea. He underwent stress testing which was abnormal and it was felt he should have cardiac catheterization. This was performed by Dr. Jacinto Halim today which showed a preserved EF and multivessel CAD. It was felt coronary bypass grafting would be indicated and TCTS consult was requested. The patient is currently chest pain free. He has a significant medical history  for insulin dependent diabetes, hypertension, hyperlipidemia, and nicotine abuse. Dr. Tyrone Sage was consulted for the consideration of coronary artery bypass grafting surgery. Potential risks, benefits, and complications were discussed with the patient and he agreed to proceed with surgery. Pre operative carotid duplex US showed no significant internal carotid artery stenosis bilaterally and ABI's were > 1 bilaterally.He underwent a CABG x 5 on 04/27/2014.  Brief Hospital Course:  The patient was extubated the evening of surgery without difficulty. He remained afebrile and hemodynamically stable. He was weaned off of Dopamine drip. Theone Murdoch, a line, and foley were removed early in the post operative course. Chest tubes did remain for a few days and then were removed. He did have ST elevation in all leads post op. Troponin was elevated but tending down. It was likely pericarditis. He did have ABL anemia. He did not require a post op transfusion. He was put on Fergon and folic acid. His last H and H was 7.5 and 22.5. Lopressor was started and titrated accordingly. He was volume over loaded and diuresed. He was weaned off the insulin drip. He was gradually restarted on his Insulin. His glucose remained well controlled. The patient's HGA1C pre op was 7.4. The patient was felt surgically stable for transfer from the ICU to [facility]U for further convalescence on 05/01/2013. He continues to progress with cardiac rehab. He was requiring 2 liters of oxygen via [place]. He was weaned to room air and has been ambulating on room air. He has been tolerating a diet and has had a bowel movement. He did develop minor sero sanguinous drainage from his sternal wound. There was no erythema. We placed him on Keflex 500 mg by mouth three times daily. He will require this for one week. Epicardial pacingwires and chest tube sutures will be removed prior to discharge. Provided the patient remains afebrile, hemodynamically stable, and pending  morning round evaluation,he will be surgically stable for discharge on 05/06/2014.   Latest Vital Signs: Blood pressure 112/58, pulse 92, temperature 98.8 F (37.1 C), temperature source Oral, resp. rate 18, height 5\' 8"  (1.727 m), weight 195 lb (88.451 kg), SpO2 93.00%.  Physical Exam: Cardiovascular: RRR, no murmurs, gallops, or rubs.  Pulmonary:Diminshed at bases bilaterally R>L; no rales, wheezes, or rhonchi.  Abdomen: Soft, non tender, bowel sounds present.  Extremities: Mild bilateral lower extremity edema.  Wounds: Lower  Extremity is clean and dry. No erythema or signs of infection. Trace sero sanguinous drainage sternal wound.   Discharge Condition:Stable  Recent laboratory studies:  Lab Results  Component Value Date   WBC 15.2* 05/05/2014   HGB 7.5* 05/05/2014   HCT 22.5* 05/05/2014   MCV 90.4 05/05/2014   PLT 368 05/05/2014   Lab Results  Component Value Date   NA 130* 05/05/2014   K 4.4 05/05/2014   CL 90* 05/05/2014   CO2 28 05/05/2014   CREATININE 1.31 05/05/2014   GLUCOSE 97 05/05/2014    Diagnostic Studies: Dg Chest 2 View  05/02/2014   CLINICAL DATA:  Difficulty breathing with chest pain  EXAM: CHEST  2 VIEW  COMPARISON:  April 30, 2014  FINDINGS: There is persistent interstitial edema. There is a new apparently loculated right pleural effusion. There is a minimal left pleural effusion. Heart is mildly enlarged with pulmonary venous hypertension. Patient is status post coronary artery bypass grafting. There is mild atelectatic change in the left mid lung.  Left chest tube is been removed. There is a minimal left lateral pneumothorax. Pacemaker wires remain attached to the right heart.  IMPRESSION: Minimal left lateral pneumothorax following chest tube removal. Evidence of congestive heart failure. New small right pleural effusion with what appears to be some loculated effusion on the right. Minimal left pleural effusion.  Critical Value/emergent results were called by  telephone at the time of interpretation on 05/02/2014 at 8:06 am to Hoover Browns, RN, who verbally acknowledged these results.   Electronically Signed   By: Bretta Bang M.D.   On: 05/02/2014 08:07  Discharge Instructions   Amb Referral to Cardiac Rehabilitation    Complete by:  As directed   Pt agrees to Outpt. CRP in [place], will send referral.           Discharge Medications:   Medication List    STOP taking these medications       docusate sodium 100 MG capsule  Commonly known as:  COLACE     ENBREL SURECLICK 50 MG/ML injection  Generic drug:  etanercept      TAKE these medications       albuterol 108 (90 BASE) MCG/ACT inhaler  Commonly known as:  PROVENTIL HFA;VENTOLIN HFA  Inhale 1-2 puffs into the lungs every 4 (four) hours as needed for wheezing or shortness of breath.     aspirin 81 MG tablet  Take 81 mg by mouth daily.     atorvastatin 40 MG tablet  Commonly known as:  LIPITOR  Take 1 tablet (40 mg total) by mouth daily.     buPROPion 150 MG 12 hr tablet  Commonly known as:  WELLBUTRIN SR  Take 150 mg by mouth 2 (two) times daily.     cephALEXin 500 MG capsule  Commonly known as:  KEFLEX  Take 1 capsule (500 mg total) by mouth every 8 (eight) hours.     Cod Liver Oil Caps  Take 1 capsule by mouth daily.     ferrous gluconate 324 MG tablet  Commonly known as:  FERGON  Take 1 tablet (324 mg total) by mouth 2 (two) times daily with a meal.     folic acid 1 MG tablet  Commonly known as:  FOLVITE  Take 1 tablet (1 mg total) by mouth daily.     insulin glargine 100 UNIT/ML injection  Commonly known as:  LANTUS  Inject 37 Units into the skin at bedtime.     insulin lispro 100 UNIT/ML injection  Commonly known as:  HUMALOG  Inject 0-15 Units into the skin 3 (three) times daily as needed for high blood sugar.     losartan 25 MG tablet  Commonly known as:  COZAAR  Take 1 tablet (25 mg total) by mouth daily.     metoprolol tartrate 25 MG tablet   Commonly known as:  LOPRESSOR  Take 25 mg by mouth 2 (two) times daily.     multivitamin with minerals Tabs tablet  Take 1 tablet by mouth daily.     oxyCODONE 5 MG immediate release tablet  Commonly known as:  Oxy IR/ROXICODONE  Take 1-2 tablets (5-10 mg total) by mouth every 4 (four) hours as needed for moderate  pain or severe pain.     PROBIOTIC PO  Take 1 tablet by mouth daily.     tiotropium 18 MCG inhalation capsule  Commonly known as:  SPIRIVA HANDIHALER  Place 1 capsule (18 mcg total) into inhaler and inhale daily.     UDDERLY SMOOTH FOOT Crea  Apply 1 application topically at bedtime. Apply to feet        The patient has been discharged on:   1.Beta Blocker:  Yes [ x  ]                              No   [   ]                              If No, reason:  2.Ace Inhibitor/ARB: Yes [ x  ]                                     No  [    ]                                     If No, reason:  3.Statin:   Yes [  x ]                  No  [   ]                  If No, reason:  4.Ecasa:  Yes  [ x  ]                  No   [   ]                  If No, reason:  Follow Up Appointments: Follow-up Information   Follow up with Delight Ovens, MD On 06/08/2014. (Appointment is at 11:30)    Specialty:  Cardiothoracic Surgery   Contact information:   [address] [place] [address] [place] [address] [phone]       Follow up with Pamella Pert, MD. (Call for an office appoitnment)    Specialty:  Cardiology   Contact information:   [address] [place] [address] [phone]       Follow up with [facility] On 06/08/2014. (Please get CXR at 10:30, located on first floor of [name])    Contact information:   [place]       Signed: ZIMMERMAN,DONIELLE MPA-C 05/05/2014, 12:48 PM

## 2014-05-02 NOTE — Progress Notes (Signed)
CARDIAC REHAB PHASE I   PRE:  Rate/Rhythm: 96 SR  BP:  Supine: 122/58  Sitting:   Standing:    SaO2: 92 RA  MODE:  Ambulation: 350 ft   POST:  Rate/Rhythm: 119 ST  BP:  Supine:   Sitting: 121/62  Standing:    SaO2: 93 RA [id number] On arrival pt in recliner, sleeping. Aroused easily, pt had O2 off RA sat 92%.. Assisted X 1 and used walker to ambulate. Gait steady with walker. Pt able to walk 350 feet. RA sat in hall 95% and on return to room 93%. Pt back to recliner after walk with call light in reach. I encouraged use of IS and two more walks today. O2 left off.  Melina Copa RN 05/02/2014 9:43 AM

## 2014-05-02 NOTE — Discharge Instructions (Signed)
Activity: 1.May walk up steps °               2.No lifting more than ten pounds for four weeks.  °               3.No driving for four weeks. °               4.Stop any activity that causes chest pain, shortness of breath, dizziness, sweating or excessive weakness. °               5.Avoid straining. °               6.Continue with your breathing exercises daily. ° °Diet: Diabetic diet and Low fat, Low salt diet ° °Wound Care: May shower.  Clean wounds with mild soap and water daily. Contact the office at [phone] if any problems arise. ° °Coronary Artery Bypass Grafting, Care After °Refer to this sheet in the next few weeks. These instructions provide you with information on caring for yourself after your procedure. Your health care provider may also give you more specific instructions. Your treatment has been planned according to current medical practices, but problems sometimes occur. Call your health care provider if you have any problems or questions after your procedure. °WHAT TO EXPECT AFTER THE PROCEDURE °Recovery from surgery will be different for everyone. Some people feel well after 3 or 4 weeks, while for others it takes longer. After your procedure, it is typical to have the following: °· Nausea and a lack of appetite.   °· Constipation. °· Weakness and fatigue.   °· Depression or irritability.   °· Pain or discomfort at your incision site. °HOME CARE INSTRUCTIONS °· Take medicines only as directed by your health care provider. Do not stop taking medicines or start any new medicines without first checking with your health care provider. °· Take your pulse as directed by your health care provider. °· Perform deep breathing as directed by your health care provider. If you were given a device called an incentive spirometer, use it to practice deep breathing several times a day. Support your chest with a pillow or your arms when you take deep breaths or cough. °· Keep incision areas clean, dry, and  protected. Remove or change any bandages (dressings) only as directed by your health care provider. You may have skin adhesive strips over the incision areas. Do not take the strips off. They will fall off on their own. °· Check incision areas daily for any swelling, redness, or drainage. °· If incisions were made in your legs, do the following: °¨ Avoid crossing your legs.   °¨ Avoid sitting for long periods of time. Change positions every 30 minutes.   °¨ Elevate your legs when you are sitting. °· Wear compression stockings as directed by your health care provider. These stockings help keep blood clots from forming in your legs. °· Take showers once your health care provider approves. Until then, only take sponge baths. Pat incisions dry. Do not rub incisions with a washcloth or towel. Do not take baths, swim, or use a hot tub until your health care provider approves. °· Eat foods that are high in fiber, such as raw fruits and vegetables, whole grains, beans, and nuts. Meats should be lean cut. Avoid canned, processed, and fried foods. °· Drink enough fluid to keep your urine clear or pale yellow. °· Weigh yourself every day. This helps identify if you are retaining fluid that may make your heart and lungs   work harder. °· Rest and limit activity as directed by your health care provider. You may be instructed to: °¨ Stop any activity at once if you have chest pain, shortness of breath, irregular heartbeats, or dizziness. Get help right away if you have any of these symptoms. °¨ Move around frequently for short periods or take short walks as directed by your health care provider. Increase your activities gradually. You may need physical therapy or cardiac rehabilitation to help strengthen your muscles and build your endurance. °¨ Avoid lifting, pushing, or pulling anything heavier than 10 lb (4.5 kg) for at least 6 weeks after surgery. °· Do not drive until your health care provider approves.  °· Ask your health  care provider when you may return to work. °· Ask your health care provider when you may resume sexual activity. °· Keep all follow-up visits as directed by your health care provider. This is important. °SEEK MEDICAL CARE IF: °· You have swelling, redness, increasing pain, or drainage at the site of an incision. °· You have a fever. °· You have swelling in your ankles or legs. °· You have pain in your legs.   °· You gain 2 or more pounds (0.9 kg) a day. °· You are nauseous or vomit. °· You have diarrhea.  °SEEK IMMEDIATE MEDICAL CARE IF: °· You have chest pain that goes to your jaw or arms. °· You have shortness of breath.   °· You have a fast or irregular heartbeat.   °· You notice a "clicking" in your breastbone (sternum) when you move.   °· You have numbness or weakness in your arms or legs. °· You feel dizzy or light-headed.   °MAKE SURE YOU: °· Understand these instructions. °· Will watch your condition. °· Will get help right away if you are not doing well or get worse. °Document Released: 02/07/2005 Document Revised: 12/05/2013 Document Reviewed: 12/28/2012 °Exit[employer]® Patient Information ©2015 [employer]. This information is not intended to replace advice given to you by your health care provider. Make sure you discuss any questions you have with your health care provider. ° ° ° °

## 2014-05-03 ENCOUNTER — Encounter (HOSPITAL_COMMUNITY): Payer: Self-pay | Admitting: Cardiothoracic Surgery

## 2014-05-03 DIAGNOSIS — N183 Chronic kidney disease, stage 3 unspecified: Secondary | ICD-10-CM | POA: Diagnosis present

## 2014-05-03 DIAGNOSIS — E119 Type 2 diabetes mellitus without complications: Secondary | ICD-10-CM

## 2014-05-03 DIAGNOSIS — Z951 Presence of aortocoronary bypass graft: Secondary | ICD-10-CM

## 2014-05-03 DIAGNOSIS — M069 Rheumatoid arthritis, unspecified: Secondary | ICD-10-CM

## 2014-05-03 HISTORY — DX: Rheumatoid arthritis, unspecified: M06.9

## 2014-05-03 HISTORY — DX: Chronic kidney disease, stage 3 unspecified: N18.30

## 2014-05-03 HISTORY — DX: Type 2 diabetes mellitus without complications: E11.9

## 2014-05-03 LAB — BASIC METABOLIC PANEL
Anion gap: 14 (ref 5–15)
BUN: 23 mg/dL (ref 6–23)
CO2: 25 mEq/L (ref 19–32)
Calcium: 8 mg/dL — ABNORMAL LOW (ref 8.4–10.5)
Chloride: 93 mEq/L — ABNORMAL LOW (ref 96–112)
Creatinine, Ser: 1.29 mg/dL (ref 0.50–1.35)
GFR calc Af Amer: 73 mL/min — ABNORMAL LOW (ref >90)
GFR calc non Af Amer: 63 mL/min — ABNORMAL LOW (ref >90)
Glucose, Bld: 152 mg/dL — ABNORMAL HIGH (ref 70–99)
Potassium: 4.3 mEq/L (ref 3.7–5.3)
Sodium: 132 mEq/L — ABNORMAL LOW (ref 137–147)

## 2014-05-03 LAB — GLUCOSE, CAPILLARY
Glucose-Capillary: 252 mg/dL — ABNORMAL HIGH (ref 70–99)
Glucose-Capillary: 208 mg/dL — ABNORMAL HIGH (ref 70–99)
Glucose-Capillary: 257 mg/dL — ABNORMAL HIGH (ref 70–99)
Glucose-Capillary: 161 mg/dL — ABNORMAL HIGH (ref 70–99)
Glucose-Capillary: 176 mg/dL — ABNORMAL HIGH (ref 70–99)

## 2014-05-03 LAB — CBC
HCT: 22.2 % — ABNORMAL LOW (ref 39.0–52.0)
Hemoglobin: 7.7 g/dL — ABNORMAL LOW (ref 13.0–17.0)
MCH: 32.2 pg (ref 26.0–34.0)
MCHC: 34.7 g/dL (ref 30.0–36.0)
MCV: 92.9 fL (ref 78.0–100.0)
Platelets: 249 10*3/uL (ref 150–400)
RBC: 2.39 MIL/uL — ABNORMAL LOW (ref 4.22–5.81)
RDW: 13.7 % (ref 11.5–15.5)
WBC: 11.4 10*3/uL — ABNORMAL HIGH (ref 4.0–10.5)

## 2014-05-03 MED ORDER — LACTULOSE 10 GM/15ML PO SOLN
20.0000 g | Freq: Once | ORAL | Status: AC
  Administered 2014-05-03: 20 g via ORAL
  Filled 2014-05-03: qty 30

## 2014-05-03 MED ORDER — INSULIN ASPART 100 UNIT/ML ~~LOC~~ SOLN
8.0000 [IU] | Freq: Three times a day (TID) | SUBCUTANEOUS | Status: DC
  Administered 2014-05-03 – 2014-05-06 (×7): 8 [IU] via SUBCUTANEOUS

## 2014-05-03 MED ORDER — INSULIN DETEMIR 100 UNIT/ML ~~LOC~~ SOLN
12.0000 [IU] | Freq: Every day | SUBCUTANEOUS | Status: DC
  Administered 2014-05-03: 12 [IU] via SUBCUTANEOUS
  Filled 2014-05-03: qty 0.12

## 2014-05-03 MED ORDER — INSULIN DETEMIR 100 UNIT/ML ~~LOC~~ SOLN
10.0000 [IU] | Freq: Two times a day (BID) | SUBCUTANEOUS | Status: DC
  Administered 2014-05-03 – 2014-05-06 (×6): 10 [IU] via SUBCUTANEOUS
  Filled 2014-05-03 (×8): qty 0.1

## 2014-05-03 MED ORDER — INSULIN ASPART 100 UNIT/ML ~~LOC~~ SOLN
0.0000 [IU] | Freq: Three times a day (TID) | SUBCUTANEOUS | Status: DC
  Administered 2014-05-03: 4 [IU] via SUBCUTANEOUS
  Administered 2014-05-04: 7 [IU] via SUBCUTANEOUS
  Administered 2014-05-04 (×2): 3 [IU] via SUBCUTANEOUS
  Administered 2014-05-05: 7 [IU] via SUBCUTANEOUS
  Administered 2014-05-05: 4 [IU] via SUBCUTANEOUS
  Administered 2014-05-05: 3 [IU] via SUBCUTANEOUS
  Administered 2014-05-06: 4 [IU] via SUBCUTANEOUS

## 2014-05-03 NOTE — Progress Notes (Signed)
On evaluation of the sternum, there is no instability noted. Trace serous drainage from mid sternal wound. Continue to monitor.

## 2014-05-03 NOTE — Progress Notes (Signed)
CARDIAC REHAB PHASE I   PRE:  Rate/Rhythm: 80 SR  BP:  Supine:   Sitting: 122/65  Standing:   SaO2: 97 1L  MODE:  Ambulation: 550 ft   POST:  Rate/Rhythm: 92 SR  BP:  Supine:   Sitting: 118/61  Standing:    SaO2: 91 RA 1040--1120 Assisted X 1 and used walker to ambulate. Pt c/o of a lot of sternal discomfort with movement and the feeling of pulling in his chest after coughing last night. Pt tolerated ambulation well, slow pace. Pushed pt to walk 550 feet. He was asking about the hospitals' Inpatient rehab unit. I explained to him that he would not qualify for that. Pt just needs a lot of encouragement to walk and use IS. Pt to bed after walk with call light in reach, pacing wires to be discontinued.  Melina Copa RN 05/03/2014 11:23 AM

## 2014-05-03 NOTE — Progress Notes (Signed)
Inpatient Diabetes Program Recommendations  AACE/ADA: New Consensus Statement on Inpatient Glycemic Control (2013)  Target Ranges:  Prepandial:   less than 140 mg/dL      Peak postprandial:   less than 180 mg/dL (1-2 hours)      Critically ill patients:  140 - 180 mg/dL   Reason for Assessment: Hyperglycemia  Diabetes history: Type 2 Outpatient Diabetes medications: Lantus 37 units at HS, Novolog meal coverage 4 units tid Current orders for Inpatient glycemic control: Levemir increased to 12 units daily, CVTS correction with meals and HS  Note:  Received about 38 units Novolog correction yesterday.  Suggest the following:  Increase levemir dosage to 12 units bid at 10 am and 10 pm to cover needs better  Begin Novolog meal coverage 8 units tid with meals- to be given if patient eats at least 50% and CBG > 80 mg/dl  Change correction to either CVTS scale tid and HS scale at HS or Resistant correction tid and HS scale. Thank you.  [name] Fok S. Elsie Lincoln, RN, CNS, CDE Inpatient Diabetes Program, team pager [phone]

## 2014-05-03 NOTE — Progress Notes (Signed)
Pt Transferred to 2w10. Report given to Dondra Prader, Charity fundraiser. Pt walked to new room, tolerated well. All pt belongings at bedside, Central tele notified. 05/03/2014 3:30 PM Eria Lozoya V, RN

## 2014-05-03 NOTE — Progress Notes (Addendum)
      [address]       [place] [address]             [phone]        6 Days Post-Op Procedure(s) (LRB): CORONARY ARTERY BYPASS GRAFTING on pump using left internal mammary artery to LAD coronary artery, right great saphenous vein graft to diagonal coronary artery with sequential to OM1 and circumflex coronary arteries. Right greater saphenous vein graft to posterior descending coronary artery.  (N/A) INTRAOPERATIVE TRANSESOPHAGEAL ECHOCARDIOGRAM (N/A) ENDOVEIN HARVEST OF GREATER SAPHENOUS VEIN (Right)  Subjective: Patient has not had a bowel movement. Had a coughing episode and "extreme pain" and thinks "he tore something in his chest".  Objective: Vital signs in last 24 hours: Temp:  [98.7 F (37.1 C)-99.4 F (37.4 C)] 98.7 F (37.1 C) (09/30 0523) Pulse Rate:  [81-97] 81 (09/30 0523) Cardiac Rhythm:  [-] Normal sinus rhythm (09/29 2157) Resp:  [18-20] 20 (09/30 0523) BP: (113-118)/(63) 113/63 mmHg (09/30 0523) SpO2:  [92 %-97 %] 96 % (09/30 0523) Weight:  [191 lb 9.3 oz (86.9 kg)] 191 lb 9.3 oz (86.9 kg) (09/30 0324)  Pre op weight 86 kg Current Weight  05/03/14 191 lb 9.3 oz (86.9 kg)      Intake/Output from previous day: 09/29 0701 - 09/30 0700 In: -  Out: 1600 [Urine:1600]   Physical Exam:  Cardiovascular: RRR, no murmurs, gallops, or rubs. Pulmonary:Slightly diminshed at bases bilaterally R>L; no rales, wheezes, or rhonchi. Abdomen: Soft, non tender, bowel sounds present. Extremities: Mild bilateral lower extremity edema. Wounds: Clean and dry.  No erythema or signs of infection.  Lab Results: CBC:  Recent Labs  05/02/14 0411 05/03/14 0350  WBC 10.8* 11.4*  HGB 7.5* 7.7*  HCT 22.0* 22.2*  PLT 211 249   BMET:   Recent Labs  05/02/14 0411 05/03/14 0350  NA 135* 132*  K 4.1 4.3  CL 97 93*  CO2 25 25  GLUCOSE 118* 152*  BUN 17 23  CREATININE 1.17 1.29  CALCIUM 7.9* 8.0*    PT/INR:  Lab Results  Component Value  Date   INR 1.35 04/27/2014   INR 0.99 04/25/2014   INR 1.01 04/25/2014   ABG:  INR: Will add last result for INR, ABG once components are confirmed Will add last 4 CBG results once components are confirmed  Assessment/Plan:  1. CV - SR in the 90's. On Lopressor 25 bid. Will increase to 25 bid.  2.  Pulmonary - On 2 liters of oxygen via [place]-wean as tolerates. Encourage incentive spirometer and flutter valve. 3. Volume Overload - On Lasix 40 daily 4.  Acute blood loss anemia - H and H down to 7.7 and 22.2 Continue Fergon and start folic acid. 5. DM-CBGs 294/252/20. Pre op HGA1C 7.4. Increase Insulin. 6. Remove EPW  7. Creatinine slightly up to 1.29. 8. LOC constipation 9. Possibly home 1-2 days  ZIMMERMAN,DONIELLE MPA-C 05/03/2014,7:34 AM  Follow up chest xray in am I have seen and examined [name] and agree with the above assessment  and plan.  Delight Ovens MD Beeper [phone] Office [phone] 05/03/2014 9:18 AM

## 2014-05-03 NOTE — Progress Notes (Addendum)
05/03/2014 12:19 PM Nursing note EPW d/c per order and per protocol. Ends Intact and wires removed without difficulty. Ventricular wire slightly shorter than average length approximately 1/8-1/4 inch in length. Doree Fudge [facility] paged and made aware. No new orders received. Pt. Tolerated well. VSS and collected per protocol. Call bell within reach. PT. Advised BR x 1 hr. Will continue to closely monitor patient.  Barron Vanloan, Blanchard Kelch

## 2014-05-04 ENCOUNTER — Inpatient Hospital Stay (HOSPITAL_COMMUNITY): Payer: 59

## 2014-05-04 LAB — GLUCOSE, CAPILLARY
Glucose-Capillary: 134 mg/dL — ABNORMAL HIGH (ref 70–99)
Glucose-Capillary: 209 mg/dL — ABNORMAL HIGH (ref 70–99)
Glucose-Capillary: 146 mg/dL — ABNORMAL HIGH (ref 70–99)
Glucose-Capillary: 77 mg/dL (ref 70–99)

## 2014-05-04 LAB — BASIC METABOLIC PANEL
Anion gap: 11 (ref 5–15)
BUN: 21 mg/dL (ref 6–23)
CO2: 29 mEq/L (ref 19–32)
Calcium: 8.4 mg/dL (ref 8.4–10.5)
Chloride: 94 mEq/L — ABNORMAL LOW (ref 96–112)
Creatinine, Ser: 1.35 mg/dL (ref 0.50–1.35)
GFR calc Af Amer: 69 mL/min — ABNORMAL LOW (ref >90)
GFR calc non Af Amer: 60 mL/min — ABNORMAL LOW (ref >90)
Glucose, Bld: 134 mg/dL — ABNORMAL HIGH (ref 70–99)
Potassium: 4.4 mEq/L (ref 3.7–5.3)
Sodium: 134 mEq/L — ABNORMAL LOW (ref 137–147)

## 2014-05-04 LAB — CBC
HCT: 23.1 % — ABNORMAL LOW (ref 39.0–52.0)
Hemoglobin: 7.9 g/dL — ABNORMAL LOW (ref 13.0–17.0)
MCH: 31 pg (ref 26.0–34.0)
MCHC: 34.2 g/dL (ref 30.0–36.0)
MCV: 90.6 fL (ref 78.0–100.0)
Platelets: 322 10*3/uL (ref 150–400)
RBC: 2.55 MIL/uL — ABNORMAL LOW (ref 4.22–5.81)
RDW: 13.5 % (ref 11.5–15.5)
WBC: 15.7 10*3/uL — ABNORMAL HIGH (ref 4.0–10.5)

## 2014-05-04 MED ORDER — RISAQUAD PO CAPS
1.0000 | ORAL_CAPSULE | Freq: Every day | ORAL | Status: DC
  Administered 2014-05-04 – 2014-05-06 (×3): 1 via ORAL
  Filled 2014-05-04 (×3): qty 1

## 2014-05-04 MED ORDER — LEVALBUTEROL HCL 0.63 MG/3ML IN NEBU
0.6300 mg | INHALATION_SOLUTION | RESPIRATORY_TRACT | Status: DC | PRN
  Administered 2014-05-05 – 2014-05-06 (×5): 0.63 mg via RESPIRATORY_TRACT
  Filled 2014-05-04 (×3): qty 3

## 2014-05-04 MED ORDER — LEVALBUTEROL HCL 0.63 MG/3ML IN NEBU
0.6300 mg | INHALATION_SOLUTION | Freq: Three times a day (TID) | RESPIRATORY_TRACT | Status: DC
  Administered 2014-05-04 (×2): 0.63 mg via RESPIRATORY_TRACT
  Filled 2014-05-04 (×5): qty 3

## 2014-05-04 NOTE — Progress Notes (Addendum)
[address]       [place] [address]             [phone]        7 Days Post-Op Procedure(s) (LRB): CORONARY ARTERY BYPASS GRAFTING on pump using left internal mammary artery to LAD coronary artery, right great saphenous vein graft to diagonal coronary artery with sequential to OM1 and circumflex coronary arteries. Right greater saphenous vein graft to posterior descending coronary artery.  (N/A) INTRAOPERATIVE TRANSESOPHAGEAL ECHOCARDIOGRAM (N/A) ENDOVEIN HARVEST OF GREATER SAPHENOUS VEIN (Right)  Subjective: Patient with incisional pain.  Objective: Vital signs in last 24 hours: Temp:  [98.3 F (36.8 C)-99.9 F (37.7 C)] 98.3 F (36.8 C) (10/01 0604) Pulse Rate:  [84-89] 89 (10/01 0604) Cardiac Rhythm:  [-] Normal sinus rhythm (09/30 2027) Resp:  [20] 20 (10/01 0604) BP: (106-128)/(50-66) 118/60 mmHg (10/01 0604) SpO2:  [91 %-96 %] 93 % (10/01 0604) Weight:  [196 lb 10.4 oz (89.2 kg)] 196 lb 10.4 oz (89.2 kg) (10/01 0604)  Pre op weight 86 kg Current Weight  05/04/14 196 lb 10.4 oz (89.2 kg)      Intake/Output from previous day: 09/30 0701 - 10/01 0700 In: 843 [P.O.:840; I.V.:3] Out: 1100 [Urine:1100]   Physical Exam:  Cardiovascular: RRR, no murmurs, gallops, or rubs. Pulmonary:Diminshed at bases bilaterally R>L; no rales, wheezes, or rhonchi. Abdomen: Soft, non tender, bowel sounds present. Extremities: Mild bilateral lower extremity edema. Wounds: RLE wounds are clean and dry.  No erythema or signs of infection. Distal sternal wound with serous drainage. Sternum is solid  Lab Results: CBC:  Recent Labs  05/03/14 0350 05/04/14 0451  WBC 11.4* 15.7*  HGB 7.7* 7.9*  HCT 22.2* 23.1*  PLT 249 322   BMET:   Recent Labs  05/03/14 0350 05/04/14 0451  NA 132* 134*  K 4.3 4.4  CL 93* 94*  CO2 25 29  GLUCOSE 152* 134*  BUN 23 21  CREATININE 1.29 1.35  CALCIUM 8.0* 8.4    PT/INR:  Lab Results  Component Value Date   INR 1.35 04/27/2014   INR 0.99 04/25/2014   INR 1.01 04/25/2014   ABG:  INR: Will add last result for INR, ABG once components are confirmed Will add last 4 CBG results once components are confirmed  Assessment/Plan:  1. CV - SR in the 90's. On Lopressor 25 bid.  Will not start ACE/ARB as creatinine up to 1.35 2.  Pulmonary - On 2 liters of oxygen via [place]-wean as tolerates. CXR this am shows small bilateral pleural effusions and atelectasis, wires intact, and no pneumothorax. Encourage incentive spirometer and flutter valve. 3. Volume Overload - On Lasix 40 daily 4.  Acute blood loss anemia - H and H down to 7.9 and 23.1. Continue Fergon and start folic acid. No need for transfusion at this time. 5. DM-CBGs 161/176/134. Pre op HGA1C 7.4. Adjustments made to Insulin yesterday with help of diabetes educator. 6. WBC increased from 11.4 to 15.7. Low grade fever to 99.9. No GU complaints.Has serous drainage from distal sternal wound. No erythema.No need for antibiotics at this time. 7. Possible discharge in am   ZIMMERMAN,DONIELLE MPA-C 05/04/2014,7:25 AM  Hold d/c today monitor serous drainage Hold ace arb with elevated cr now Cardiology to restart as outpatient  Poss home in am I have seen and examined [name] and agree with the above assessment  and plan.  Delight Ovens MD Beeper [phone] Office [phone] 05/04/2014  9:47 AM

## 2014-05-04 NOTE — Progress Notes (Signed)
CARDIAC REHAB PHASE I   PRE:  Rate/Rhythm: 99 SR  BP:  Supine:   Sitting: 122/50  Standing:    SaO2: 93 RA  MODE:  Ambulation: 700 ft   POST:  Rate/Rhythm: 106  BP:  Supine:   Sitting: 122/50  Standing:    SaO2: 94 RA 1055-1130 On arrival pt in recliner. Assisted X 1 and used walker. Gaitt steady with walker. Pt c/o of incisional pain and pain with coughing. I pushed him to walk 700 feet. VS stable Pt back to recliner after walk.I encouraged use of IS. We discussed Outpt. CRP, he agrees to referral to [facility].  Melina Copa RN 05/04/2014 11:43 AM

## 2014-05-05 LAB — GLUCOSE, CAPILLARY
Glucose-Capillary: 126 mg/dL — ABNORMAL HIGH (ref 70–99)
Glucose-Capillary: 215 mg/dL — ABNORMAL HIGH (ref 70–99)
Glucose-Capillary: 165 mg/dL — ABNORMAL HIGH (ref 70–99)
Glucose-Capillary: 85 mg/dL (ref 70–99)

## 2014-05-05 LAB — CBC
HCT: 22.5 % — ABNORMAL LOW (ref 39.0–52.0)
Hemoglobin: 7.5 g/dL — ABNORMAL LOW (ref 13.0–17.0)
MCH: 30.1 pg (ref 26.0–34.0)
MCHC: 33.3 g/dL (ref 30.0–36.0)
MCV: 90.4 fL (ref 78.0–100.0)
Platelets: 368 10*3/uL (ref 150–400)
RBC: 2.49 MIL/uL — ABNORMAL LOW (ref 4.22–5.81)
RDW: 13.6 % (ref 11.5–15.5)
WBC: 15.2 10*3/uL — ABNORMAL HIGH (ref 4.0–10.5)

## 2014-05-05 LAB — BASIC METABOLIC PANEL
Anion gap: 12 (ref 5–15)
BUN: 18 mg/dL (ref 6–23)
CO2: 28 mEq/L (ref 19–32)
Calcium: 8.4 mg/dL (ref 8.4–10.5)
Chloride: 90 mEq/L — ABNORMAL LOW (ref 96–112)
Creatinine, Ser: 1.31 mg/dL (ref 0.50–1.35)
GFR calc Af Amer: 72 mL/min — ABNORMAL LOW (ref >90)
GFR calc non Af Amer: 62 mL/min — ABNORMAL LOW (ref >90)
Glucose, Bld: 97 mg/dL (ref 70–99)
Potassium: 4.4 mEq/L (ref 3.7–5.3)
Sodium: 130 mEq/L — ABNORMAL LOW (ref 137–147)

## 2014-05-05 MED ORDER — CEPHALEXIN 500 MG PO CAPS
500.0000 mg | ORAL_CAPSULE | Freq: Three times a day (TID) | ORAL | Status: DC
  Administered 2014-05-05 – 2014-05-06 (×4): 500 mg via ORAL
  Filled 2014-05-05 (×7): qty 1

## 2014-05-05 MED ORDER — SORBITOL 70 % SOLN
960.0000 mL | TOPICAL_OIL | Freq: Once | ORAL | Status: AC
  Administered 2014-05-05: 960 mL via RECTAL
  Filled 2014-05-05: qty 240

## 2014-05-05 MED ORDER — LEVALBUTEROL HCL 0.63 MG/3ML IN NEBU
0.6300 mg | INHALATION_SOLUTION | Freq: Once | RESPIRATORY_TRACT | Status: DC
  Filled 2014-05-05 (×2): qty 3

## 2014-05-05 MED ORDER — FLEET ENEMA 7-19 GM/118ML RE ENEM
1.0000 | ENEMA | Freq: Once | RECTAL | Status: AC
  Administered 2014-05-05: 1 via RECTAL
  Filled 2014-05-05: qty 1

## 2014-05-05 MED ORDER — ONDANSETRON HCL 4 MG/2ML IJ SOLN
4.0000 mg | Freq: Four times a day (QID) | INTRAMUSCULAR | Status: DC | PRN
  Filled 2014-05-05: qty 2

## 2014-05-05 NOTE — Progress Notes (Signed)
CARDIAC REHAB PHASE I   PRE:  Rate/Rhythm: 88 SR  BP:  Supine:   Sitting: 112/50  Standing:    SaO2: 97 2L 90 RA  MODE:  Ambulation: 890 ft   POST:  Rate/Rhythm: 107  BP:  Supine:   Sitting: 122/56  Standing:    SaO2: 88 RA 94 2L 0935-1030 On arrival pt had on O2 2L, states that his O2 sat was low after walking last night 88% and that he was SOB and ask for it to be put on. I took his O2 off and he walked on room air sat in hall and on return to room 88%. Placed pt back on O2 2L. He c/o of SOB and wheezing just sitting and also with walking. I pushed pt to ambulate 890 feet, which he tolerated well. Completed discharge education with pt and wife. They voice understanding. We discussed smoking cessation. I gave him tips for quitting, coaching contact number and quit smart class information. He seems motivated to quit. I placed recovery from heart surgery video on for them to watch. Reported to RN. Pt states that he is using his IS hourly. Melina Copa RN 05/05/2014 10:30 AM

## 2014-05-05 NOTE — Progress Notes (Signed)
MD on call notified that pt is having vomiting and no orders at this time. New orders received will continue

## 2014-05-05 NOTE — Progress Notes (Signed)
DC CT sutures per MD order; patient had 2 BM's today.  Hermina Barters, RN

## 2014-05-05 NOTE — Progress Notes (Signed)
Patient ambulated X's 3 today; pt ambulated 526ft all 3X's with front wheel rolling walker.  Hermina Barters, RN

## 2014-05-05 NOTE — Progress Notes (Addendum)
[address]       [place] [address]             [phone]        8 Days Post-Op Procedure(s) (LRB): CORONARY ARTERY BYPASS GRAFTING on pump using left internal mammary artery to LAD coronary artery, right great saphenous vein graft to diagonal coronary artery with sequential to OM1 and circumflex coronary arteries. Right greater saphenous vein graft to posterior descending coronary artery.  (N/A) INTRAOPERATIVE TRANSESOPHAGEAL ECHOCARDIOGRAM (N/A) ENDOVEIN HARVEST OF GREATER SAPHENOUS VEIN (Right)  Subjective: Patient still with no bowel movement.  Objective: Vital signs in last 24 hours: Temp:  [98.4 F (36.9 C)-99.7 F (37.6 C)] 98.8 F (37.1 C) (10/02 0624) Pulse Rate:  [89-97] 92 (10/02 0500) Cardiac Rhythm:  [-] Normal sinus rhythm (10/01 1950) Resp:  [18-20] 18 (10/02 0500) BP: (112-143)/(58-68) 112/58 mmHg (10/02 0500) SpO2:  [90 %-98 %] 93 % (10/02 0500) Weight:  [195 lb (88.451 kg)] 195 lb (88.451 kg) (10/02 0628)  Pre op weight 86 kg Current Weight  05/05/14 195 lb (88.451 kg)      Intake/Output from previous day: 10/01 0701 - 10/02 0700 In: 963 [P.O.:960; I.V.:3] Out: 950 [Urine:950]   Physical Exam:  Cardiovascular: RRR, no murmurs, gallops, or rubs. Pulmonary:Expiratory wheezing Abdomen: Soft, non tender, distention,bowel sounds present. Extremities: Mild bilateral lower extremity edema. Wounds: RLE wounds are clean and dry.  No erythema or signs of infection. Distal sternal wound with trace serous drainage. Sternum is solid  Lab Results: CBC:  Recent Labs  05/04/14 0451 05/05/14 0325  WBC 15.7* 15.2*  HGB 7.9* 7.5*  HCT 23.1* 22.5*  PLT 322 368   BMET:   Recent Labs  05/04/14 0451 05/05/14 0325  NA 134* 130*  K 4.4 4.4  CL 94* 90*  CO2 29 28  GLUCOSE 134* 97  BUN 21 18  CREATININE 1.35 1.31  CALCIUM 8.4 8.4    PT/INR:  Lab Results  Component Value Date   INR 1.35 04/27/2014   INR 0.99 04/25/2014     INR 1.01 04/25/2014   ABG:  INR: Will add last result for INR, ABG once components are confirmed Will add last 4 CBG results once components are confirmed  Assessment/Plan:  1. CV - SR in the 80's. On Lopressor 25 bid.  Will not start ACE/ARB as creatinine remains 1.31 2.  Pulmonary - On room air. Encourage incentive spirometer and flutter valve. 3. Volume Overload - On Lasix 40 daily 4.  Acute blood loss anemia - H and H is 7.5 and 22.5. Continue Fergon and start folic acid. No need for transfusion at this time. 5. DM-CBGs 146/77/126. Pre op HGA1C 7.4. Adjustments previously made to Insulin  with help of diabetes educator. Will need to obtain a medical doctor for further follow up. 6. WBC slightly decreased from 15.7 to 15.2. Low grade fever to 99.7. No GU complaints.Has serous drainage from distal sternal wound. No erythema. Will put on Keflex as WBC remains elevated. 7. Hyponatremia-sodium down to 130. Secondary to diuresis. 8. Enema for constipation 9.Xopenex this am for wheezing 10. Remove sutures in am 11. Likely discharge in am   ZIMMERMAN,DONIELLE MPA-C 05/05/2014,7:44 AM  Constipated given enema and po laxative sternum stable and drainage decreased, on po keflex I have seen and examined [name] and agree with the above assessment  and plan.  Delight Ovens MD Beeper [phone] Office [phone] 05/05/2014 5:16 PM

## 2014-05-06 LAB — GLUCOSE, CAPILLARY: Glucose-Capillary: 169 mg/dL — ABNORMAL HIGH (ref 70–99)

## 2014-05-06 MED ORDER — ATORVASTATIN CALCIUM 40 MG PO TABS: 40.0000 mg | ORAL_TABLET | Freq: Every day | ORAL | Status: DC

## 2014-05-06 MED ORDER — LOSARTAN POTASSIUM 25 MG PO TABS: 25.0000 mg | ORAL_TABLET | Freq: Every day | ORAL | Status: DC

## 2014-05-06 MED ORDER — FOLIC ACID 1 MG PO TABS
1.0000 mg | ORAL_TABLET | Freq: Every day | ORAL | Status: DC
Start: 2014-05-06 — End: 2014-06-15

## 2014-05-06 MED ORDER — TIOTROPIUM BROMIDE MONOHYDRATE 18 MCG IN CAPS: 18.0000 ug | ORAL_CAPSULE | Freq: Every day | RESPIRATORY_TRACT | Status: DC

## 2014-05-06 MED ORDER — FERROUS GLUCONATE 324 (38 FE) MG PO TABS: 324.0000 mg | ORAL_TABLET | Freq: Two times a day (BID) | ORAL | Status: DC

## 2014-05-06 MED ORDER — CEPHALEXIN 500 MG PO CAPS: 500.0000 mg | ORAL_CAPSULE | Freq: Three times a day (TID) | ORAL | Status: DC

## 2014-05-06 MED ORDER — OXYCODONE HCL 5 MG PO TABS: 5.0000 mg | ORAL_TABLET | ORAL | Status: DC | PRN

## 2014-05-06 NOTE — Progress Notes (Signed)
[name]       [place] [address]             [phone]      9 Days Post-Op Procedure(s) (LRB): CORONARY ARTERY BYPASS GRAFTING on pump using left internal mammary artery to LAD coronary artery, right great saphenous vein graft to diagonal coronary artery with sequential to OM1 and circumflex coronary arteries. Right greater saphenous vein graft to posterior descending coronary artery.  (N/A) INTRAOPERATIVE TRANSESOPHAGEAL ECHOCARDIOGRAM (N/A) ENDOVEIN HARVEST OF GREATER SAPHENOUS VEIN (Right) Subjective:  feels ok, had BM  Objective: Vital signs in last 24 hours: Temp:  [98.4 F (36.9 C)-99.3 F (37.4 C)] 98.4 F (36.9 C) (10/03 0526) Pulse Rate:  [87-92] 87 (10/03 0526) Cardiac Rhythm:  [-] Normal sinus rhythm (10/02 2130) Resp:  [20] 20 (10/03 0526) BP: (111-127)/(50-67) 111/50 mmHg (10/03 0526) SpO2:  [96 %-99 %] 99 % (10/03 0526) Weight:  [192 lb 3.9 oz (87.2 kg)] 192 lb 3.9 oz (87.2 kg) (10/03 0526)  Hemodynamic parameters for last 24 hours:    Intake/Output from previous day: 10/02 0701 - 10/03 0700 In: 240 [P.O.:240] Out: 100 [Urine:100] Intake/Output this shift:    General appearance: alert, cooperative and no distress Heart: regular rate and rhythm Lungs: clear to auscultation bilaterally Abdomen: benign Extremities: no edema Wound: incis healing well  Lab Results:  Recent Labs  05/04/14 0451 05/05/14 0325  WBC 15.7* 15.2*  HGB 7.9* 7.5*  HCT 23.1* 22.5*  PLT 322 368   BMET:  Recent Labs  05/04/14 0451 05/05/14 0325  NA 134* 130*  K 4.4 4.4  CL 94* 90*  CO2 29 28  GLUCOSE 134* 97  BUN 21 18  CREATININE 1.35 1.31  CALCIUM 8.4 8.4    PT/INR: No results found for this basename: LABPROT, INR,  in the last 72 hours ABG    Component Value Date/Time   PHART 7.349* 04/27/2014 1948   HCO3 23.2 04/27/2014 1948   TCO2 22 04/28/2014 1719   ACIDBASEDEF 2.0 04/27/2014 1948   O2SAT 98.0 04/27/2014 1948   CBG (last 3)     Recent Labs  05/05/14 1756 05/05/14 2059 05/06/14 0613  GLUCAP 165* 85 169*    Meds Scheduled Meds: . acidophilus  1 capsule Oral Daily  . aspirin EC  81 mg Oral Daily  . atorvastatin  40 mg Oral Daily  . buPROPion  150 mg Oral BID  . cephALEXin  500 mg Oral 3 times per day  . docusate sodium  200 mg Oral Daily  . enoxaparin (LOVENOX) injection  30 mg Subcutaneous Q24H  . ferrous gluconate  324 mg Oral BID WC  . folic acid  1 mg Oral Daily  . furosemide  40 mg Oral Daily  . insulin aspart  0-20 Units Subcutaneous TID WC  . insulin aspart  8 Units Subcutaneous TID WC  . insulin detemir  10 Units Subcutaneous BID  . levalbuterol  0.63 mg Nebulization Once  . metoprolol tartrate  25 mg Oral BID  . multivitamin with minerals  1 tablet Oral Daily  . pantoprazole  40 mg Oral QAC breakfast  . potassium chloride  20 mEq Oral Daily  . sodium chloride  3 mL Intravenous Q12H  . tiotropium  18 mcg Inhalation Daily   Continuous Infusions:  PRN Meds:.sodium chloride, bisacodyl, bisacodyl, guaiFENesin, levalbuterol, ondansetron (ZOFRAN) IV, oxyCODONE, sodium chloride, traMADol  Xrays No results found.  Assessment/Plan: S/P Procedure(s) (LRB): CORONARY ARTERY BYPASS  GRAFTING on pump using left internal mammary artery to LAD coronary artery, right great saphenous vein graft to diagonal coronary artery with sequential to OM1 and circumflex coronary arteries. Right greater saphenous vein graft to posterior descending coronary artery.  (N/A) INTRAOPERATIVE TRANSESOPHAGEAL ECHOCARDIOGRAM (N/A) ENDOVEIN HARVEST OF GREATER SAPHENOUS VEIN (Right) Plan for discharge: see discharge orders Reduce arb   LOS: 11 days    GOLD,[name] 05/06/2014

## 2014-05-06 NOTE — Progress Notes (Signed)
No stiches found on the patient at this time

## 2014-05-06 NOTE — Progress Notes (Signed)
Patient d/c home, d/c instruction given, patient verbalized understanding. Condition satble

## 2014-05-19 ENCOUNTER — Encounter (HOSPITAL_COMMUNITY): Payer: Self-pay | Admitting: Cardiothoracic Surgery

## 2014-05-19 NOTE — Addendum Note (Signed)
Addendum created 05/19/14 1738 by Judie Petit, MD   Modules edited: Anesthesia Events

## 2014-06-01 ENCOUNTER — Ambulatory Visit (INDEPENDENT_AMBULATORY_CARE_PROVIDER_SITE_OTHER): Payer: 59 | Admitting: Internal Medicine

## 2014-06-01 ENCOUNTER — Other Ambulatory Visit (INDEPENDENT_AMBULATORY_CARE_PROVIDER_SITE_OTHER): Payer: 59

## 2014-06-01 ENCOUNTER — Encounter: Payer: Self-pay | Admitting: Internal Medicine

## 2014-06-01 VITALS — BP 138/62 | HR 70

## 2014-06-01 DIAGNOSIS — J90 Pleural effusion, not elsewhere classified: Secondary | ICD-10-CM

## 2014-06-01 DIAGNOSIS — R0600 Dyspnea, unspecified: Secondary | ICD-10-CM

## 2014-06-01 LAB — CBC WITH DIFFERENTIAL/PLATELET
Basophils Absolute: 0.1 10*3/uL (ref 0.0–0.1)
Basophils Relative: 0.5 % (ref 0.0–3.0)
Eosinophils Absolute: 1.4 10*3/uL — ABNORMAL HIGH (ref 0.0–0.7)
Eosinophils Relative: 14 % — ABNORMAL HIGH (ref 0.0–5.0)
HCT: 34.7 % — ABNORMAL LOW (ref 39.0–52.0)
Hemoglobin: 11.3 g/dL — ABNORMAL LOW (ref 13.0–17.0)
Lymphocytes Relative: 29.1 % (ref 12.0–46.0)
Lymphs Abs: 2.8 10*3/uL (ref 0.7–4.0)
MCHC: 32.5 g/dL (ref 30.0–36.0)
MCV: 90 fl (ref 78.0–100.0)
Monocytes Absolute: 0.6 10*3/uL (ref 0.1–1.0)
Monocytes Relative: 6.3 % (ref 3.0–12.0)
Neutro Abs: 4.9 10*3/uL (ref 1.4–7.7)
Neutrophils Relative %: 50.1 % (ref 43.0–77.0)
Platelets: 383 10*3/uL (ref 150.0–400.0)
RBC: 3.85 Mil/uL — ABNORMAL LOW (ref 4.22–5.81)
RDW: 15.6 % — ABNORMAL HIGH (ref 11.5–15.5)
WBC: 9.8 10*3/uL (ref 4.0–10.5)

## 2014-06-01 LAB — BASIC METABOLIC PANEL
BUN: 15 mg/dL (ref 6–23)
CO2: 26 mEq/L (ref 19–32)
Calcium: 9.3 mg/dL (ref 8.4–10.5)
Chloride: 103 mEq/L (ref 96–112)
Creatinine, Ser: 1.3 mg/dL (ref 0.4–1.5)
GFR: 59.88 mL/min — ABNORMAL LOW (ref >60.00)
Glucose, Bld: 54 mg/dL — ABNORMAL LOW (ref 70–99)
Potassium: 4.3 mEq/L (ref 3.5–5.1)
Sodium: 137 mEq/L (ref 135–145)

## 2014-06-01 LAB — SEDIMENTATION RATE: Sed Rate: 58 mm/hr — ABNORMAL HIGH (ref 0–22)

## 2014-06-01 LAB — BRAIN NATRIURETIC PEPTIDE: Pro B Natriuretic peptide (BNP): 411 pg/mL — ABNORMAL HIGH (ref 0.0–100.0)

## 2014-06-01 LAB — TSH: TSH: 2 u[IU]/mL (ref 0.35–4.50)

## 2014-06-01 NOTE — Patient Instructions (Addendum)
Try prilosec 20mg   Take 30-60 min before first meal of the day and Pepcid ac 20 mg one bedtime until return (both are over the counter)  Stop spiriva  Only use your albuterol as a rescue medication to be used if you can't catch your breath by resting or doing a relaxed purse lip breathing pattern.  - The less you use it, the better it will work when you need it. - Ok to use up to 2 puffs  every 4 hours if you must but call for immediate appointment if use goes up over your usual need - Don't leave home without it !!  (think of it like the spare tire for your car)   GERD (REFLUX)  is an extremely common cause of respiratory symptoms, many times with no significant heartburn at all.    It can be treated with medication, but also with lifestyle changes including avoidance of late meals, excessive alcohol, smoking cessation, and avoid fatty foods, chocolate, peppermint, colas, red wine, and acidic juices such as orange juice.  NO MINT OR MENTHOL PRODUCTS SO NO COUGH DROPS  USE SUGARLESS CANDY INSTEAD (jolley ranchers or Stover's)  NO OIL BASED VITAMINS - use powdered substitutes.    Please remember to go to the lab  department downstairs for your tests - we will call you with the results when they are available.  Please schedule a follow up office visit in 2 weeks, sooner if needed

## 2014-06-01 NOTE — Progress Notes (Signed)
Quick Note:  Spoke with pt and notified of results per Dr. Wert. Pt verbalized understanding and denied any questions.  ______ 

## 2014-06-01 NOTE — Progress Notes (Signed)
Subjective:    Patient ID: [name], male    DOB: 02-04-1966, 48 y.o.   MRN: [id number]  HPI  48 yowm quit smoking 04/2014 dx'd as copd 2014 due to sob seemed better with albuterol then CABG for IHD/ cp but still sob so referred 06/01/2014 to pulmonary clinic by Dr Nadara Eaton and Eloise Harman   06/01/2014 1st  Pulmonary office visit/ Wert   Chief Complaint  Patient presents with  . Pulmonary Consult    Referred by Dr. Nadara Eaton. Pt c/o SOB and wheezing- started after had CABG on 04/27/14.  He states that he is SOB all of the time. Notices wheezing in the am and late evening. He also c/o cough since his surgery-prod with clear to brown sputum.   since surgery 2 pillows orthopnea, wakes up coughing at usual hour. Walk slow x 1000 ft  No better on spiriva or saba to date   No obvious other patterns in day to day or daytime variabilty or assoc  cp or chest tightness, subjective wheeze overt sinus or hb symptoms. No unusual exp hx or h/o childhood pna/ asthma or knowledge of premature birth.  Sleeping ok without nocturnal  or early am exacerbation  of respiratory  c/o's or need for noct saba. Also denies any obvious fluctuation of symptoms with weather or environmental changes or other aggravating or alleviating factors except as outlined above   Current Medications, Allergies, Complete Past Medical History, Past Surgical History, Family History, and Social History were reviewed in [employer] record.             Review of Systems  Constitutional: Negative for fever, chills, activity change, appetite change and unexpected weight change.  HENT: Negative for congestion, dental problem, postnasal drip, rhinorrhea, sneezing, sore throat, trouble swallowing and voice change.   Eyes: Negative for visual disturbance.  Respiratory: Positive for cough and shortness of breath. Negative for choking.   Cardiovascular: Negative for chest pain and leg swelling.    Gastrointestinal: Negative for nausea, vomiting and abdominal pain.  Genitourinary: Negative for difficulty urinating.  Musculoskeletal: Negative for arthralgias.  Skin: Negative for rash.  Psychiatric/Behavioral: Negative for behavioral problems and confusion.       Objective:   Physical Exam    amb slt hoarse wm nad  Wt Readings from Last 3 Encounters:  05/06/14 192 lb 3.9 oz (87.2 kg)  05/06/14 192 lb 3.9 oz (87.2 kg)  05/06/14 192 lb 3.9 oz (87.2 kg)      HEENT: nl dentition, turbinates, and orophanx. Nl external ear canals without cough reflex   NECK :  without JVD/Nodes/TM/ nl carotid upstrokes bilaterally   LUNGS: no acc muscle use, clear to A and P bilaterally without cough on insp or exp maneuvers Minimal decrease bs with dullness both bases R > L   CV:  RRR  no s3 or murmur or increase in P2, no edema   ABD:  soft and nontender with nl excursion in the supine position. No bruits or organomegaly, bowel sounds nl  MS:  warm without deformities, calf tenderness, cyanosis or clubbing  SKIN: warm and dry without lesions    NEURO:  alert, approp, no deficits     cxr 05/04/14 There has been further interval decrease in the volume of the right  pleural effusion. The tiny left pneumothorax resolved. The pulmonary  interstitium remains prominent consistent with mild interstitial  edema or less likely early pneumonia.    Recent Labs Lab 06/01/14 1026  NA 137  K 4.3  CL 103  CO2 26  BUN 15  CREATININE 1.3  GLUCOSE 54*    Recent Labs Lab 06/01/14 1026  HGB 11.3*  HCT 34.7*  WBC 9.8  PLT 383.0     Lab Results  Component Value Date   TSH 2.00 06/01/2014     Lab Results  Component Value Date   PROBNP 411.0* 06/01/2014     Lab Results  Component Value Date   ESRSEDRATE 58* 06/01/2014         Assessment & Plan:

## 2014-06-03 ENCOUNTER — Encounter: Payer: Self-pay | Admitting: Internal Medicine

## 2014-06-03 DIAGNOSIS — J90 Pleural effusion, not elsewhere classified: Secondary | ICD-10-CM | POA: Insufficient documentation

## 2014-06-03 NOTE — Assessment & Plan Note (Addendum)
-   PFT 04/25/14 no sign airflow obstruction  - 06/01/2014  Walked RA x 3 laps @ 185 ft each stopped due to end of study, no desats   Symptoms are markedly disproportionate to objective findings and not clear this is a lung problem but pt could have have difficult airway management issues. DDX of  difficult airways management all start with A and  include Adherence, Ace Inhibitors, Acid Reflux, Active Sinus Disease, Alpha 1 Antitripsin deficiency, Anxiety masquerading as Airways dz,  ABPA,  allergy(esp in young), Aspiration (esp in elderly), Adverse effects of DPI,  Active smokers, plus two Bs  = Bronchiectasis and Beta blocker use..and one C= CHF  Adherence is always the initial "prime suspect" and is a multilayered concern that requires a "trust but verify" approach in every patient - starting with knowing how to use medications, especially inhalers, correctly, keeping up with refills and understanding the fundamental difference between maintenance and prns vs those medications only taken for a very short course and then stopped and not refilled.   ? Acid (or non-acid) GERD > always difficult to exclude as up to 75% of pts in some series report no assoc GI/ Heartburn symptoms and he had esophagram 2010 =Normal esophageal motility. Minimal reflux. Minimal hiatal  hernia. No stricture. Suspicion for distal esophagitis > rec max (24h)  acid suppression and diet restrictions/ reviewed and instructions given in writing.   ? Adverse effect of dpi > since he doesn't have sign copd > try off  ? Active sinus dz suggested by am cough > consider sinus ct next   ? BB effect > unlikely at such low doses   ? chf >  Lab Results  Component Value Date   PROBNP 411.0* 06/01/2014  note good LV pre op > f/u Dr Jacinto Halim with echo planned w/in a week    For now he's doing relatively well so f/u conservatively

## 2014-06-03 NOTE — Assessment & Plan Note (Signed)
S/p CABG 04/27/14   Assoc with increased interstitial markings and moderately high ESR in pt with h/o RA but no def flare which raises the possibility of a low grade form of ALI/pleuritis (Dressler's like ) but is not severe enough to warrant systemic steroids and he has cri so want to avoid nsaids here  For now rec conservative f/u in 2 weeks

## 2014-06-07 ENCOUNTER — Other Ambulatory Visit: Payer: Self-pay | Admitting: Cardiothoracic Surgery

## 2014-06-07 DIAGNOSIS — Z951 Presence of aortocoronary bypass graft: Secondary | ICD-10-CM

## 2014-06-08 ENCOUNTER — Ambulatory Visit
Admission: RE | Admit: 2014-06-08 | Discharge: 2014-06-08 | Disposition: A | Discharge status: Home / Self Care | Payer: 59 | Source: Ambulatory Visit | Attending: Cardiothoracic Surgery | Admitting: Cardiothoracic Surgery

## 2014-06-08 ENCOUNTER — Ambulatory Visit (INDEPENDENT_AMBULATORY_CARE_PROVIDER_SITE_OTHER): Payer: Self-pay | Admitting: Cardiothoracic Surgery

## 2014-06-08 ENCOUNTER — Encounter: Payer: Self-pay | Admitting: Cardiothoracic Surgery

## 2014-06-08 VITALS — BP 138/76 | HR 67 | Resp 20 | Ht 68.0 in | Wt 190.0 lb

## 2014-06-08 DIAGNOSIS — I6523 Occlusion and stenosis of bilateral carotid arteries: Secondary | ICD-10-CM | POA: Insufficient documentation

## 2014-06-08 DIAGNOSIS — Z951 Presence of aortocoronary bypass graft: Secondary | ICD-10-CM

## 2014-06-08 NOTE — Patient Instructions (Signed)
    [address]       [place] [address]             [phone]       Coronary Artery Bypass Grafting  Care After  Refer to this sheet in the next few weeks. These instructions provide you with information on caring for yourself after your procedure. Your caregiver may also give you more specific instructions. Your treatment has been planned according to current medical practices, but problems sometimes occur. Call your caregiver if you have any problems or questions after your procedure.  Recovery from open heart surgery will be different for everyone. Some people feel well after 3 or 4 weeks, while for others it takes longer. After heart surgery, it may be normal to:  Not have an appetite, feel nauseated by the smell of food, or only want to eat a small amount.   Be constipated because of changes in your diet, activity, and medicines. Eat foods high in fiber. Add fresh fruits and vegetables to your diet. Stool softeners may be helpful.   Feel sad or unhappy. You may be frustrated or cranky. You may have good days and bad days. Do not give up. Talk to your caregiver if you do not feel better.   Feel weakness and fatigue. You many need physical therapy or cardiac rehabilitation to get your strength back.   Develop an irregular heartbeat called atrial fibrillation. Symptoms of atrial fibrillation are a fast, irregular heartbeat or feelings of fluttery heartbeats, shortness of breath, low blood pressure, and dizziness. If these symptoms develop, see your caregiver right away.  MEDICATION  Have a list of all the medicines you will be taking when you leave the hospital. For every medicine, know the following:   Name.   Exact dose.   Time of day to be taken.   How often it should be taken.   Why you are taking it.   Ask which medicines should or should not be taken together. If you take more than one heart medicine, ask if it is okay to take them together. Some  heart medicines should not be taken at the same time because they may lower your blood pressure too much.   Narcotic pain medicine can cause constipation. Eat fresh fruits and vegetables. Add fiber to your diet. Stool softener medicine may help relieve constipation.   Keep a copy of your medicines with you at all times.   Do not add or stop taking any medicine until you check with your caregiver.   Medicines can have side effects. Call your caregiver who prescribed the medicine if you:   Start throwing up, have diarrhea, or have stomach pain.   Feel dizzy or lightheaded when you stand up.   Feel your heart is skipping beats or is beating too fast or too slow.   Develop a rash.   Notice unusual bruising or bleeding.  HOME CARE INSTRUCTIONS  After heart surgery, it is important to learn how to take your pulse. Have your caregiver show you how to take your pulse.   Use your incentive spirometer. Ask your caregiver how long after surgery you need to use it.  Care of your chest incision  Tell your caregiver right away if you notice clicking in your chest (sternum).   Support your chest with a pillow or your arms when you take deep breaths and cough.   Follow your caregiver's instructions about when you can bathe or   swim.   Protect your incision from sunlight during the first year to keep the scar from getting dark.   Tell your caregiver if you notice:   Increased tenderness of your incision.   Increased redness or swelling around your incision.   Drainage or pus from your incision.  Care of your leg incision(s)  Avoid crossing your legs.   Avoid sitting for long periods of time. Change positions every half hour.   Elevate your leg(s) when you are sitting.   Check your leg(s) daily for swelling. Check the incisions for redness or drainage.   Diet is very important to heart health.   Eat plenty of fresh fruits and vegetables. Meats should be lean cut. Avoid canned,  processed, and fried foods.   Talk to a dietician. They can teach you how to make healthy food and drink choices.  Weight  Weigh yourself every day. This is important because it helps to know if you are retaining fluid that may make your heart and lungs work harder.   Use the same scale each time.   Weigh yourself every morning at the same time. You should do this after you go to the bathroom, but before you eat breakfast.   Your weight will be more accurate if you do not wear any clothes.   Record your weight.   Tell your caregiver if you have gained 2 pounds or more overnight.  Activity Stop any activity at once if you have chest pain, shortness of breath, irregular heartbeats, or dizziness. Get help right away if you have any of these symptoms.  Bathing.  Avoid soaking in a bath or hot tub until your incisions are healed.   Rest. You need a balance of rest and activity.   Exercise. Exercise per your caregiver's advice. You may need physical therapy or cardiac rehabilitation to help strengthen your muscles and build your endurance.   Climbing stairs. Unless your caregiver tells you not to climb stairs, go up stairs slowly and rest if you tire. Do not pull yourself up by the handrail.   Driving a car. Follow your caregiver's advice on when you may drive. You may ride as a passenger at any time. When traveling for long periods of time in a car, get out of the car and walk around for a few minutes every 2 hours.   Lifting. Avoid lifting, pushing, or pulling anything heavier than 10 pounds for 6 weeks after surgery or as told by your caregiver.   Returning to work. Check with your caregiver. People heal at different rates. Most people will be able to go back to work 6 to 12 weeks after surgery.   Sexual activity. You may resume sexual relations as told by your caregiver.  SEEK MEDICAL CARE IF:  Any of your incisions are red, painful, or have any type of drainage coming from them.     You have an oral temperature above 101.5 F .   You have ankle or leg swelling.   You have pain in your legs.   You have weight gain of 2 or more pounds a day.   You feel dizzy or lightheaded when you stand up.  SEEK IMMEDIATE MEDICAL CARE IF:  You have angina or chest pain that goes to your jaw or arms. Call your local emergency services right away.   You have shortness of breath at rest or with activity.   You have a fast or irregular heartbeat (arrhythmia).   There is   a "clicking" in your sternum when you move.   You have numbness or weakness in your arms or legs.  MAKE SURE YOU:  Understand these instructions.   Will watch your condition.   Will get help right away if you are not doing well or get worse.    No lifting over 25 lbs for 3 months 

## 2014-06-08 NOTE — Progress Notes (Signed)
[address]       [place] [address]             [phone]      JOEPH SZATKOWSKI [facility] Medical Record #[id number] Date of Birth: [birth year]  Referring: Pamella Pert, MD Primary Care: Garlan Fillers, MD  Chief Complaint:   POST OP FOLLOW UP 04/27/2014  OPERATIVE REPORT PREOPERATIVE DIAGNOSIS: Coronary occlusive disease with unstable angina. POSTOPERATIVE DIAGNOSIS: Coronary occlusive disease with unstable angina. SURGICAL PROCEDURE: Coronary artery bypass grafting x5 with the left internal mammary to the left anterior descending coronary artery, reverse saphenous vein graft to the diagonal coronary artery, sequential reverse saphenous vein graft to the first obtuse marginal and distal circumflex, reverse saphenous vein graft to the mid posterior descending coronary artery with right thigh and calf endo vein harvesting. SURGEON: Sheliah Plane, MD  History of Present Illness:     Patient is making good progress following surgery. Preoperatively he is known to have rheumatoid arthritis and significant diabetes. Early postoperatively he had increase shortness of breath and wheezing, this is improved. He was also noted preoperatively to have bilateral carotid stenosis 40-59%, asymptomatic.  He is now ready to start cardiac rehabilitation. He has not had no recurrent angina.     Past Medical History  Diagnosis Date  . Coronary artery disease   . Anginal pain   . Shortness of breath   . Asthma   . Diabetes mellitus without complication     type 1  . Arthritis     RA IN HANDS  . Unstable angina pectoris 04/25/2014  . Rheumatoid arthritis involving multiple joints 05/03/2014  . Diabetes 05/03/2014  . CAD (coronary artery disease) 04/27/2014  . Chronic renal disease, stage 3, moderately decreased glomerular filtration rate between 30-59 mL/min/1.73 square meter 05/03/2014     History  Smoking status  . Former Smoker -- 2.00  packs/day for 34 years  . Types: Cigarettes  . Quit date: 04/24/2014  Smokeless tobacco  . Never Used    History  Alcohol Use  . Yes    Comment: RARE     No Known Allergies  Current Outpatient Prescriptions  Medication Sig Dispense Refill  . albuterol (PROVENTIL HFA;VENTOLIN HFA) 108 (90 BASE) MCG/ACT inhaler Inhale 1-2 puffs into the lungs every 4 (four) hours as needed for wheezing or shortness of breath.    Marland Kitchen aspirin 81 MG tablet Take 81 mg by mouth daily.    Marland Kitchen atorvastatin (LIPITOR) 40 MG tablet Take 1 tablet (40 mg total) by mouth daily. 30 tablet 1  . buPROPion (WELLBUTRIN SR) 150 MG 12 hr tablet Take 150 mg by mouth 2 (two) times daily.    . famotidine (PEPCID) 20 MG tablet Take 20 mg by mouth at bedtime.    . ferrous gluconate (FERGON) 324 MG tablet Take 1 tablet (324 mg total) by mouth 2 (two) times daily with a meal. 60 tablet 3  . folic acid (FOLVITE) 1 MG tablet Take 1 tablet (1 mg total) by mouth daily. 30 tablet 3  . insulin glargine (LANTUS) 100 UNIT/ML injection Inject 37 Units into the skin at bedtime.     . insulin lispro (HUMALOG) 100 UNIT/ML injection Inject 0-15 Units into the skin 3 (three) times daily as needed for high blood sugar.     . losartan (COZAAR) 25 MG tablet Take 1 tablet (25 mg total) by mouth daily. 30 tablet 1  . metoprolol tartrate (LOPRESSOR) 25 MG  tablet Take 50 mg by mouth 2 (two) times daily.     . Multiple Vitamin (MULTIVITAMIN WITH MINERALS) TABS tablet Take 1 tablet by mouth daily.    Marland Kitchen omeprazole (PRILOSEC) 20 MG capsule Take 20 mg by mouth daily.    . Podiatric Products (UDDERLY SMOOTH FOOT) CREA Apply 1 application topically at bedtime. Apply to feet    . Probiotic Product (PROBIOTIC PO) Take 1 tablet by mouth daily.     No current facility-administered medications for this visit.       Physical Exam: BP 138/76 mmHg  Pulse 67  Resp 20  Ht 5\' 8"  (1.727 m)  Wt 190 lb (86.183 kg)  BMI 28.90 kg/m2  SpO2 98%  General  appearance: alert and cooperative Neurologic: intact Heart: regular rate and rhythm, S1, S2 normal, no murmur, click, rub or gallop Lungs: clear to auscultation bilaterally Abdomen: soft, non-tender; bowel sounds normal; no masses,  no organomegaly Extremities: extremities normal, atraumatic, no cyanosis or edema and Homans sign is negative, no sign of DVT Wound: sternum is stable and well healed   Diagnostic Studies & Laboratory data:     Recent Radiology Findings:   Dg Chest 2 View  06/08/2014   CLINICAL DATA:  Status post cardiac surgery in September 2015; persistent shortness of breath  EXAM: CHEST  2 VIEW  COMPARISON:  PA and lateral chest of May 04, 2014  FINDINGS: The lungs are well-expanded. The interstitial markings have nearly normalized. There remains small pleural effusion on the left. There is no pleural effusion on the right today. No recurrent pneumothorax on the left is demonstrated. The heart and pulmonary vascularity are within the limits of normal. There are 7 intact sternal wires and there are 3 coronary artery bypass markers visible. The bony thorax exhibits no acute abnormality peer  IMPRESSION: Ongoing improvement in the appearance of the chest with total resolution of the right pleural effusion and pulmonary edema with decreasing volume of the left pleural effusion.   Electronically Signed   By: David  May 06, 2014   On: 06/08/2014 11:23      Recent Lab Findings: Lab Results  Component Value Date   WBC 9.8 06/01/2014   HGB 11.3* 06/01/2014   HCT 34.7* 06/01/2014   PLT 383.0 06/01/2014   GLUCOSE 54* 06/01/2014   ALT 34 04/25/2014   AST 34 04/25/2014   NA 137 06/01/2014   K 4.3 06/01/2014   CL 103 06/01/2014   CREATININE 1.3 06/01/2014   BUN 15 06/01/2014   CO2 26 06/01/2014   TSH 2.00 06/01/2014   INR 1.35 04/27/2014   HGBA1C 7.4* 04/25/2014      Assessment / Plan:     Patient with preoperatively rheumatoid arthritis and diabetes, now status post coronary  artery bypass grafting doing well I discussed with the patient importance of continued close treatment of his diabetes and lipids. He'll discuss with cardiology follow-up carotid Dopplers in 1 year His chest x-ray is now clear of effusions and significantly improved aeration. Since his work involves potential heavy physical activity I recommended that he wait a total of 3 months prior to returning to work, this is already indicated on his preop FML papers   04/27/2014 MD      [address] [place] Office [phone]   Beeper [phone]  06/08/2014 1:08 PM

## 2014-06-15 ENCOUNTER — Ambulatory Visit (INDEPENDENT_AMBULATORY_CARE_PROVIDER_SITE_OTHER): Payer: 59 | Admitting: Internal Medicine

## 2014-06-15 ENCOUNTER — Encounter: Payer: Self-pay | Admitting: Internal Medicine

## 2014-06-15 VITALS — BP 106/64 | HR 62 | Ht 68.0 in | Wt 189.0 lb

## 2014-06-15 DIAGNOSIS — R0600 Dyspnea, unspecified: Secondary | ICD-10-CM

## 2014-06-15 NOTE — Progress Notes (Signed)
Subjective:    Patient ID: [name], male    DOB: [birth year],     MRN: [id number]    Brief patient profile:  48 yowm quit smoking 04/2014 dx'd as copd 2014 due to sob seemed better with albuterol then CABG for IHD/ cp but still sob so referred 06/01/2014 to pulmonary clinic by Dr Nadara Eaton and Eloise Harman with bilateral pleural effusions persisting post op.    History of Present Illness  06/01/2014 1st [facility] office visit/ [name]   Chief Complaint  Patient presents with  . Pulmonary Consult    Referred by Dr. Nadara Eaton. Pt c/o SOB and wheezing- started after had CABG on 04/27/14.  He states that he is SOB all of the time. Notices wheezing in the am and late evening. He also c/o cough since his surgery-prod with clear to brown sputum.   since surgery 2 pillows orthopnea, wakes up coughing at usual hour. Walk slow x 1000 ft  No better on spiriva or saba to date rec Try prilosec 20mg   Take 30-60 min before first meal of the day and Pepcid ac 20 mg one bedtime until return (both are over the counter) Stop spiriva Only use your albuterol  If you can't catch your breath GERD diet      06/15/2014 f/u ov/[name] re: post cabg effusions/ sob / cough  Chief Complaint  Patient presents with  . Follow-up    Pt states that his breathing has improved. He has not noticed any more wheezing and the cough has improved.    Walking up to 3000 ft per day  Lie flat ok   No obvious day to day or daytime variabilty or assoc chronic cough or cp or chest tightness, subjective wheeze overt sinus or hb symptoms. No unusual exp hx or h/o childhood pna/ asthma or knowledge of premature birth.  Sleeping ok without nocturnal  or early am exacerbation  of respiratory  c/o's or need for noct saba. Also denies any obvious fluctuation of symptoms with weather or environmental changes or other aggravating or alleviating factors except as outlined above   Current Medications, Allergies, Complete Past Medical  History, Past Surgical History, Family History, and Social History were reviewed in 13/07/2014 record.  ROS  The following are not active complaints unless bolded sore throat, dysphagia, dental problems, itching, sneezing,  nasal congestion or excess/ purulent secretions, ear ache,   fever, chills, sweats, unintended wt loss, pleuritic or exertional cp, hemoptysis,  orthopnea pnd or leg swelling, presyncope, palpitations, heartburn, abdominal pain, anorexia, nausea, vomiting, diarrhea  or change in bowel or urinary habits, change in stools or urine, dysuria,hematuria,  rash, arthralgias, visual complaints, headache, numbness weakness or ataxia or problems with walking or coordination,  change in mood/affect or memory.                    Objective:   Physical Exam    amb slt hoarse wm nad with throat clearing much less   . Wt Readings from Last 3 Encounters:  06/15/14 189 lb (85.73 kg)  06/08/14 190 lb (86.183 kg)  05/06/14 192 lb 3.9 oz (87.2 kg)    Vital signs reviewed   HEENT: nl dentition, turbinates, and orophanx. Nl external ear canals without cough reflex   NECK :  without JVD/Nodes/TM/ nl carotid upstrokes bilaterally   LUNGS: no acc muscle use, clear to A and P bilaterally without cough on insp or exp maneuvers - minimal dullness/ decreased bs  L base     CV:  RRR  no s3 or murmur or increase in P2, no edema   ABD:  soft and nontender with nl excursion in the supine position. No bruits or organomegaly, bowel sounds nl  MS:  warm without deformities, calf tenderness, cyanosis or clubbing  SKIN: warm and dry without lesions    NEURO:  alert, approp, no deficits      cxr 06/08/14 Ongoing improvement in the appearance of the chest with total resolution of the right pleural effusion and pulmonary edema with decreasing volume of the left pleural effusion.    Recent Labs Lab 06/01/14 1026  NA 137  K 4.3  CL 103  CO2 26  BUN 15    CREATININE 1.3  GLUCOSE 54*    Recent Labs Lab 06/01/14 1026  HGB 11.3*  HCT 34.7*  WBC 9.8  PLT 383.0     Lab Results  Component Value Date   TSH 2.00 06/01/2014     Lab Results  Component Value Date   PROBNP 411.0* 06/01/2014     Lab Results  Component Value Date   ESRSEDRATE 58* 06/01/2014         Assessment & Plan:

## 2014-06-15 NOTE — Patient Instructions (Addendum)
At the end of November ok to try off the acid suppression to see if the cough the throat get worse and if so see Dr Eloise Harman

## 2014-06-17 NOTE — Assessment & Plan Note (Signed)
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

## 2014-06-22 ENCOUNTER — Encounter (HOSPITAL_COMMUNITY)
Admission: RE | Admit: 2014-06-22 | Discharge: 2014-06-22 | Disposition: A | Discharge status: Home / Self Care | Payer: 59 | Source: Ambulatory Visit | Attending: Cardiology | Admitting: Cardiology

## 2014-06-22 DIAGNOSIS — I2510 Atherosclerotic heart disease of native coronary artery without angina pectoris: Secondary | ICD-10-CM | POA: Insufficient documentation

## 2014-06-22 DIAGNOSIS — Z5189 Encounter for other specified aftercare: Secondary | ICD-10-CM | POA: Insufficient documentation

## 2014-06-22 DIAGNOSIS — F17210 Nicotine dependence, cigarettes, uncomplicated: Secondary | ICD-10-CM | POA: Insufficient documentation

## 2014-06-22 DIAGNOSIS — I2582 Chronic total occlusion of coronary artery: Secondary | ICD-10-CM | POA: Insufficient documentation

## 2014-06-22 DIAGNOSIS — Z7982 Long term (current) use of aspirin: Secondary | ICD-10-CM | POA: Insufficient documentation

## 2014-06-22 DIAGNOSIS — E785 Hyperlipidemia, unspecified: Secondary | ICD-10-CM | POA: Insufficient documentation

## 2014-06-22 DIAGNOSIS — Z951 Presence of aortocoronary bypass graft: Secondary | ICD-10-CM | POA: Insufficient documentation

## 2014-06-22 DIAGNOSIS — N183 Chronic kidney disease, stage 3 (moderate): Secondary | ICD-10-CM | POA: Insufficient documentation

## 2014-06-22 DIAGNOSIS — E119 Type 2 diabetes mellitus without complications: Secondary | ICD-10-CM | POA: Insufficient documentation

## 2014-06-22 DIAGNOSIS — Z794 Long term (current) use of insulin: Secondary | ICD-10-CM | POA: Insufficient documentation

## 2014-06-22 DIAGNOSIS — Z79899 Other long term (current) drug therapy: Secondary | ICD-10-CM | POA: Insufficient documentation

## 2014-06-22 NOTE — Progress Notes (Signed)
Cardiac Rehab Medication Review by a Pharmacist  Does the patient  feel that his/her medications are working for him/her?  yes  Has the patient been experiencing any side effects to the medications prescribed?  no  Does the patient measure his/her own blood pressure or blood glucose at home?  Yes. BP normally runs 120s/70s. A1c 6.9%. CBGs 80-130s.   Does the patient have any problems obtaining medications due to transportation or finances?   no  Understanding of regimen: good Understanding of indications: good Potential of compliance: good  Pharmacist comments: Pt reports compliance. Pt does state he is taking Plavix as well so that medication has been added to the list. No questions or concerns other than asking if any of his medications might be causing constipation. He was taking iron, but is now off of that so I told him increasing his fluid intake and fiber in his diet would help.    Armandina Stammer 06/22/2014 8:49 AM

## 2014-06-26 ENCOUNTER — Encounter (HOSPITAL_COMMUNITY): Payer: 59

## 2014-06-26 ENCOUNTER — Encounter (HOSPITAL_COMMUNITY): Payer: Self-pay

## 2014-06-26 ENCOUNTER — Encounter (HOSPITAL_COMMUNITY)
Admission: RE | Admit: 2014-06-26 | Discharge: 2014-06-26 | Disposition: A | Discharge status: Home / Self Care | Payer: 59 | Source: Ambulatory Visit | Attending: Cardiology | Admitting: Cardiology

## 2014-06-26 DIAGNOSIS — Z794 Long term (current) use of insulin: Secondary | ICD-10-CM | POA: Diagnosis not present

## 2014-06-26 DIAGNOSIS — I2582 Chronic total occlusion of coronary artery: Secondary | ICD-10-CM | POA: Diagnosis not present

## 2014-06-26 DIAGNOSIS — I2510 Atherosclerotic heart disease of native coronary artery without angina pectoris: Secondary | ICD-10-CM | POA: Diagnosis not present

## 2014-06-26 DIAGNOSIS — Z7982 Long term (current) use of aspirin: Secondary | ICD-10-CM | POA: Diagnosis not present

## 2014-06-26 DIAGNOSIS — Z951 Presence of aortocoronary bypass graft: Secondary | ICD-10-CM | POA: Diagnosis not present

## 2014-06-26 DIAGNOSIS — E119 Type 2 diabetes mellitus without complications: Secondary | ICD-10-CM | POA: Diagnosis not present

## 2014-06-26 DIAGNOSIS — E785 Hyperlipidemia, unspecified: Secondary | ICD-10-CM | POA: Diagnosis not present

## 2014-06-26 DIAGNOSIS — F17210 Nicotine dependence, cigarettes, uncomplicated: Secondary | ICD-10-CM | POA: Diagnosis not present

## 2014-06-26 DIAGNOSIS — Z79899 Other long term (current) drug therapy: Secondary | ICD-10-CM | POA: Diagnosis not present

## 2014-06-26 DIAGNOSIS — N183 Chronic kidney disease, stage 3 (moderate): Secondary | ICD-10-CM | POA: Diagnosis not present

## 2014-06-26 DIAGNOSIS — Z5189 Encounter for other specified aftercare: Secondary | ICD-10-CM | POA: Diagnosis present

## 2014-06-26 LAB — GLUCOSE, CAPILLARY: Glucose-Capillary: 127 mg/dL — ABNORMAL HIGH (ref 70–99)

## 2014-06-26 NOTE — Progress Notes (Signed)
Pt unable to exercise today due to delayed MD approval of exercise in response to new T wave inversion on EKG.  Pt asymptomatic.   PHQ-0.  Quality of life reviewed with patient. Pt admits to recent family stressors related to irreconcilable  Marriage with delayed separation And divorce due to his illness and surgery.  Pt has recently quit smoking.  Pt congratulated for this success and encouraged on continued efforts for  Remain tobacco free. Pt instructed to call 1800quitnow and given a fake cigarette for hand/mouth cravings. Pt currently taking welbutrin which he is tolerating well  Pt will plan to exercise on next scheduled day.

## 2014-06-28 ENCOUNTER — Encounter (HOSPITAL_COMMUNITY)
Admission: RE | Admit: 2014-06-28 | Discharge: 2014-06-28 | Disposition: A | Discharge status: Home / Self Care | Payer: 59 | Source: Ambulatory Visit | Attending: Cardiology | Admitting: Cardiology

## 2014-06-28 ENCOUNTER — Encounter (HOSPITAL_COMMUNITY): Payer: 59

## 2014-06-28 DIAGNOSIS — Z5189 Encounter for other specified aftercare: Secondary | ICD-10-CM | POA: Diagnosis not present

## 2014-06-28 LAB — GLUCOSE, CAPILLARY
Glucose-Capillary: 92 mg/dL (ref 70–99)
Glucose-Capillary: 66 mg/dL — ABNORMAL LOW (ref 70–99)
Glucose-Capillary: 103 mg/dL — ABNORMAL HIGH (ref 70–99)

## 2014-06-28 NOTE — Progress Notes (Signed)
Pt started cardiac rehab today.  Pt tolerated light exercise without difficulty.  VSS, telemetry-sinus rhythm, t wave inversion.  Asymptomatic.  Pre exercise CBG-92.  Pt reports eating flatbread sandwich 45 minutes prior to coming to rehab.  Pt given banana.  Post exercise CBG-66.  Pt given gingerale with 15 minute recheck 103.  Pt instructed to add additional carbohydrate and protein choice to his meal prior to exercise.  Pt oriented to exercise equipment and routine.  Understanding verbalized.

## 2014-06-30 ENCOUNTER — Encounter (HOSPITAL_COMMUNITY): Payer: 59

## 2014-07-03 ENCOUNTER — Encounter (HOSPITAL_COMMUNITY): Payer: 59

## 2014-07-03 ENCOUNTER — Encounter (HOSPITAL_COMMUNITY)
Admission: RE | Admit: 2014-07-03 | Discharge: 2014-07-03 | Disposition: A | Discharge status: Home / Self Care | Payer: 59 | Source: Ambulatory Visit | Attending: Cardiology | Admitting: Cardiology

## 2014-07-03 DIAGNOSIS — Z5189 Encounter for other specified aftercare: Secondary | ICD-10-CM | POA: Diagnosis not present

## 2014-07-03 LAB — GLUCOSE, CAPILLARY: Glucose-Capillary: 253 mg/dL — ABNORMAL HIGH (ref 70–99)

## 2014-07-05 ENCOUNTER — Encounter (HOSPITAL_COMMUNITY): Payer: 59

## 2014-07-05 ENCOUNTER — Encounter (HOSPITAL_COMMUNITY)
Admission: RE | Admit: 2014-07-05 | Discharge: 2014-07-05 | Disposition: A | Discharge status: Home / Self Care | Payer: 59 | Source: Ambulatory Visit | Attending: Cardiology | Admitting: Cardiology

## 2014-07-05 DIAGNOSIS — I2510 Atherosclerotic heart disease of native coronary artery without angina pectoris: Secondary | ICD-10-CM | POA: Diagnosis not present

## 2014-07-05 DIAGNOSIS — Z5189 Encounter for other specified aftercare: Secondary | ICD-10-CM | POA: Insufficient documentation

## 2014-07-05 DIAGNOSIS — N183 Chronic kidney disease, stage 3 (moderate): Secondary | ICD-10-CM | POA: Diagnosis not present

## 2014-07-05 DIAGNOSIS — F17210 Nicotine dependence, cigarettes, uncomplicated: Secondary | ICD-10-CM | POA: Diagnosis not present

## 2014-07-05 DIAGNOSIS — Z7982 Long term (current) use of aspirin: Secondary | ICD-10-CM | POA: Diagnosis not present

## 2014-07-05 DIAGNOSIS — Z794 Long term (current) use of insulin: Secondary | ICD-10-CM | POA: Diagnosis not present

## 2014-07-05 DIAGNOSIS — E785 Hyperlipidemia, unspecified: Secondary | ICD-10-CM | POA: Diagnosis not present

## 2014-07-05 DIAGNOSIS — I2582 Chronic total occlusion of coronary artery: Secondary | ICD-10-CM | POA: Diagnosis not present

## 2014-07-05 DIAGNOSIS — Z951 Presence of aortocoronary bypass graft: Secondary | ICD-10-CM | POA: Insufficient documentation

## 2014-07-05 DIAGNOSIS — E119 Type 2 diabetes mellitus without complications: Secondary | ICD-10-CM | POA: Diagnosis not present

## 2014-07-05 DIAGNOSIS — Z79899 Other long term (current) drug therapy: Secondary | ICD-10-CM | POA: Diagnosis not present

## 2014-07-05 LAB — GLUCOSE, CAPILLARY
Glucose-Capillary: 59 mg/dL — ABNORMAL LOW (ref 70–99)
Glucose-Capillary: 60 mg/dL — ABNORMAL LOW (ref 70–99)
Glucose-Capillary: 110 mg/dL — ABNORMAL HIGH (ref 70–99)

## 2014-07-05 NOTE — Progress Notes (Signed)
Pt post exercise CBG-59.  Pt asymptomatic.  Pt given 8 oz gingerale.  15 minute recheck -60.  Pt given banana and lemonade. Pt met with [name], RD, certified diabetes educator.  15 minute repeat CBG-110.  Pt plans to eat carbohydrate and protein prior to next exercise session.  Pt also instructed to bring home meter for evaluation.  Understanding verbalized

## 2014-07-05 NOTE — Progress Notes (Signed)
[name] 48 y.o. male Nutrition Note Spoke with pt.  Nutrition Plan and Nutrition Survey goals reviewed with pt. Pt is working toward following the Therapeutic Lifestyle Changes diet. Pt wants to lose wt. Pt encouraged to focus on wt maintenance at this time due to recent tobacco cessation.  Pt is diabetic. Last A1c indicates blood glucose not optimally controlled. Per pt, A1C usually ranges from 6.8-6.9. Dr. Sharlett Iles manages pt's DM. Pt reports he has been DM since the age of 43. Per discussion, pt is very knowledgeable re: DM. Pt typically eats cereal, 2% milk, and activia for breakfast and eats a [place] or ham sandwich on wheat bread and possibly an apple for lunch. Pt brings a peanut butter and jelly sandwich just in case his CBG's are too low. Dinner usually consists of baked/broil/grilled chicken, pasta/starchy vegetable, and a non-starchy vegetable. Post-exercise CBG was 59 mg/dL today. Pt feels he gave himself too much insulin because "I ate out which I rarely ever do and I ate a steak and cheese sandwich." This writer went over Diabetes Education test results. Pt expressed understanding of the information reviewed. Pt aware of nutrition education classes offered.  Nutrition Diagnosis ? Food-and nutrition-related knowledge deficit related to lack of exposure to information as related to diagnosis of: ? CVD ? DM (A1c 7.4) ? Overweight related to excessive energy intake as evidenced by a BMI of 28.6  Nutrition RX/ Estimated Daily Nutrition Needs for: wt maintenance at this time  2400-2700 Kcal, 75-90 gm fat, 15-18 gm sat fat, 2.3-2.7 gm trans-fat, <1500 mg sodium, 325 gm CHO   Nutrition Intervention ? Pt's individual nutrition plan reviewed with pt. ? Benefits of adopting Therapeutic Lifestyle Changes discussed when Medficts reviewed. ? Pt to attend the Portion Distortion class ? Pt to attend the  ? Nutrition I class - met; 07/04/14                    ? Nutrition II class        ?  Diabetes Blitz class       ? Diabetes Q & A class ? Pt given handouts for: ? Nutrition I class ? Nutrition II class ? Consistent vit K diet  ? low sodium ? DM ? pre-diabetes ? Diabetes Blitz class ? Diabetes Q & A class ? Continue client-centered nutrition education by RD, as part of interdisciplinary care. Goal(s) ? Pt to identify and limit food sources of saturated fat, trans fat, and cholesterol ? Use pre-meal and post-meal CBG's and A1c to determine whether adjustments in food/meal planning will be beneficial or if any meds need to be combined with nutrition therapy. Monitor and Evaluate progress toward nutrition goal with team. Nutrition Risk: Change to Moderate [name], M.Ed, RD, LDN, CDE 07/05/2014 4:00 PM

## 2014-07-07 ENCOUNTER — Encounter (HOSPITAL_COMMUNITY)
Admission: RE | Admit: 2014-07-07 | Discharge: 2014-07-07 | Disposition: A | Discharge status: Home / Self Care | Payer: 59 | Source: Ambulatory Visit | Attending: Cardiology | Admitting: Cardiology

## 2014-07-07 ENCOUNTER — Encounter (HOSPITAL_COMMUNITY): Payer: 59

## 2014-07-07 DIAGNOSIS — Z5189 Encounter for other specified aftercare: Secondary | ICD-10-CM | POA: Diagnosis not present

## 2014-07-07 LAB — GLUCOSE, CAPILLARY
Glucose-Capillary: 103 mg/dL — ABNORMAL HIGH (ref 70–99)
Glucose-Capillary: 88 mg/dL (ref 70–99)

## 2014-07-10 ENCOUNTER — Encounter (HOSPITAL_COMMUNITY): Payer: 59

## 2014-07-10 ENCOUNTER — Encounter (HOSPITAL_COMMUNITY)
Admission: RE | Admit: 2014-07-10 | Discharge: 2014-07-10 | Disposition: A | Discharge status: Home / Self Care | Payer: 59 | Source: Ambulatory Visit | Attending: Cardiology | Admitting: Cardiology

## 2014-07-10 DIAGNOSIS — Z5189 Encounter for other specified aftercare: Secondary | ICD-10-CM | POA: Diagnosis not present

## 2014-07-10 LAB — GLUCOSE, CAPILLARY
Glucose-Capillary: 53 mg/dL — ABNORMAL LOW (ref 70–99)
Glucose-Capillary: 91 mg/dL (ref 70–99)
Glucose-Capillary: 111 mg/dL — ABNORMAL HIGH (ref 70–99)

## 2014-07-12 ENCOUNTER — Encounter (HOSPITAL_COMMUNITY)
Admission: RE | Admit: 2014-07-12 | Discharge: 2014-07-12 | Disposition: A | Discharge status: Home / Self Care | Payer: 59 | Source: Ambulatory Visit | Attending: Cardiology | Admitting: Cardiology

## 2014-07-12 ENCOUNTER — Encounter (HOSPITAL_COMMUNITY): Payer: 59

## 2014-07-12 DIAGNOSIS — Z5189 Encounter for other specified aftercare: Secondary | ICD-10-CM | POA: Diagnosis not present

## 2014-07-12 LAB — GLUCOSE, CAPILLARY
Glucose-Capillary: 114 mg/dL — ABNORMAL HIGH (ref 70–99)
Glucose-Capillary: 51 mg/dL — ABNORMAL LOW (ref 70–99)
Glucose-Capillary: 98 mg/dL (ref 70–99)

## 2014-07-13 ENCOUNTER — Encounter (HOSPITAL_COMMUNITY): Payer: Self-pay | Admitting: Cardiology

## 2014-07-14 ENCOUNTER — Encounter (HOSPITAL_COMMUNITY)
Admission: RE | Admit: 2014-07-14 | Discharge: 2014-07-14 | Disposition: A | Discharge status: Home / Self Care | Payer: 59 | Source: Ambulatory Visit | Attending: Cardiology | Admitting: Cardiology

## 2014-07-14 ENCOUNTER — Encounter (HOSPITAL_COMMUNITY): Payer: 59

## 2014-07-14 DIAGNOSIS — Z5189 Encounter for other specified aftercare: Secondary | ICD-10-CM | POA: Diagnosis not present

## 2014-07-14 LAB — GLUCOSE, CAPILLARY
Glucose-Capillary: 119 mg/dL — ABNORMAL HIGH (ref 70–99)
Glucose-Capillary: 82 mg/dL (ref 70–99)

## 2014-07-17 ENCOUNTER — Encounter (HOSPITAL_COMMUNITY)
Admission: RE | Admit: 2014-07-17 | Discharge: 2014-07-17 | Disposition: A | Discharge status: Home / Self Care | Payer: 59 | Source: Ambulatory Visit | Attending: Cardiology | Admitting: Cardiology

## 2014-07-17 ENCOUNTER — Encounter (HOSPITAL_COMMUNITY): Payer: 59

## 2014-07-17 DIAGNOSIS — Z5189 Encounter for other specified aftercare: Secondary | ICD-10-CM | POA: Diagnosis not present

## 2014-07-17 LAB — GLUCOSE, CAPILLARY
Glucose-Capillary: 206 mg/dL — ABNORMAL HIGH (ref 70–99)
Glucose-Capillary: 178 mg/dL — ABNORMAL HIGH (ref 70–99)

## 2014-07-17 NOTE — Progress Notes (Signed)
Reviewed home exercise guidelines with patient including endpoints, temperature precautions, target heart rate and rate of perceived exertion. Pt plans to walk as his mode of home exercise. Pt voices understanding of instructions given. Natanel Snavely M Octavio Matheney, MS, ACSM CCEP  

## 2014-07-19 ENCOUNTER — Encounter (HOSPITAL_COMMUNITY): Payer: 59

## 2014-07-19 ENCOUNTER — Encounter (HOSPITAL_COMMUNITY)
Admission: RE | Admit: 2014-07-19 | Discharge: 2014-07-19 | Disposition: A | Discharge status: Home / Self Care | Payer: 59 | Source: Ambulatory Visit | Attending: Cardiology | Admitting: Cardiology

## 2014-07-19 DIAGNOSIS — Z5189 Encounter for other specified aftercare: Secondary | ICD-10-CM | POA: Diagnosis not present

## 2014-07-19 LAB — GLUCOSE, CAPILLARY: Glucose-Capillary: 148 mg/dL — ABNORMAL HIGH (ref 70–99)

## 2014-07-20 ENCOUNTER — Other Ambulatory Visit: Payer: Self-pay | Admitting: Dermatology

## 2014-07-21 ENCOUNTER — Encounter (HOSPITAL_COMMUNITY): Payer: 59

## 2014-07-21 ENCOUNTER — Encounter (HOSPITAL_COMMUNITY)
Admission: RE | Admit: 2014-07-21 | Discharge: 2014-07-21 | Disposition: A | Discharge status: Home / Self Care | Payer: 59 | Source: Ambulatory Visit | Attending: Cardiology | Admitting: Cardiology

## 2014-07-21 DIAGNOSIS — Z5189 Encounter for other specified aftercare: Secondary | ICD-10-CM | POA: Diagnosis not present

## 2014-07-21 LAB — GLUCOSE, CAPILLARY
Glucose-Capillary: 182 mg/dL — ABNORMAL HIGH (ref 70–99)
Glucose-Capillary: 125 mg/dL — ABNORMAL HIGH (ref 70–99)

## 2014-07-24 ENCOUNTER — Encounter (HOSPITAL_COMMUNITY)
Admission: RE | Admit: 2014-07-24 | Discharge: 2014-07-24 | Disposition: A | Discharge status: Home / Self Care | Payer: 59 | Source: Ambulatory Visit | Attending: Cardiology | Admitting: Cardiology

## 2014-07-24 ENCOUNTER — Encounter (HOSPITAL_COMMUNITY): Payer: 59

## 2014-07-24 DIAGNOSIS — Z5189 Encounter for other specified aftercare: Secondary | ICD-10-CM | POA: Diagnosis not present

## 2014-07-24 LAB — GLUCOSE, CAPILLARY
Glucose-Capillary: 228 mg/dL — ABNORMAL HIGH (ref 70–99)
Glucose-Capillary: 162 mg/dL — ABNORMAL HIGH (ref 70–99)

## 2014-07-26 ENCOUNTER — Telehealth (HOSPITAL_COMMUNITY): Payer: Self-pay | Admitting: Cardiac Rehabilitation

## 2014-07-26 ENCOUNTER — Encounter (HOSPITAL_COMMUNITY): Payer: 59

## 2014-07-26 ENCOUNTER — Encounter (HOSPITAL_COMMUNITY): Admission: RE | Admit: 2014-07-26 | Payer: 59 | Source: Ambulatory Visit

## 2014-07-26 NOTE — Telephone Encounter (Signed)
pc received from pt will be absent from cardiac rehab today due to swelling and pain in left hand.  Pt denies injury states swelling increased overnight.  Pt instructed to contact PCP or Dr. Cornelious Bryant today for evaluation.  Understanding verbalized.

## 2014-07-28 ENCOUNTER — Encounter (HOSPITAL_COMMUNITY): Payer: 59

## 2014-07-31 ENCOUNTER — Encounter (HOSPITAL_COMMUNITY)
Admission: RE | Admit: 2014-07-31 | Discharge: 2014-07-31 | Disposition: A | Discharge status: Home / Self Care | Payer: 59 | Source: Ambulatory Visit | Attending: Cardiology | Admitting: Cardiology

## 2014-07-31 ENCOUNTER — Encounter (HOSPITAL_COMMUNITY): Payer: 59

## 2014-07-31 DIAGNOSIS — Z5189 Encounter for other specified aftercare: Secondary | ICD-10-CM | POA: Diagnosis not present

## 2014-07-31 LAB — GLUCOSE, CAPILLARY: Glucose-Capillary: 239 mg/dL — ABNORMAL HIGH (ref 70–99)

## 2014-08-02 ENCOUNTER — Encounter (HOSPITAL_COMMUNITY)
Admission: RE | Admit: 2014-08-02 | Discharge: 2014-08-02 | Disposition: A | Discharge status: Home / Self Care | Payer: 59 | Source: Ambulatory Visit | Attending: Cardiology | Admitting: Cardiology

## 2014-08-02 ENCOUNTER — Encounter (HOSPITAL_COMMUNITY): Payer: 59

## 2014-08-02 DIAGNOSIS — Z5189 Encounter for other specified aftercare: Secondary | ICD-10-CM | POA: Diagnosis not present

## 2014-08-02 LAB — GLUCOSE, CAPILLARY
Glucose-Capillary: 136 mg/dL — ABNORMAL HIGH (ref 70–99)
Glucose-Capillary: 98 mg/dL (ref 70–99)

## 2014-08-04 ENCOUNTER — Encounter (HOSPITAL_COMMUNITY): Payer: 59

## 2014-08-07 ENCOUNTER — Encounter (HOSPITAL_COMMUNITY)
Admission: RE | Admit: 2014-08-07 | Discharge: 2014-08-07 | Disposition: A | Discharge status: Home / Self Care | Payer: 59 | Source: Ambulatory Visit | Attending: Cardiology | Admitting: Cardiology

## 2014-08-07 ENCOUNTER — Encounter (HOSPITAL_COMMUNITY): Payer: 59

## 2014-08-07 DIAGNOSIS — Z79899 Other long term (current) drug therapy: Secondary | ICD-10-CM | POA: Insufficient documentation

## 2014-08-07 DIAGNOSIS — Z951 Presence of aortocoronary bypass graft: Secondary | ICD-10-CM | POA: Diagnosis not present

## 2014-08-07 DIAGNOSIS — I2510 Atherosclerotic heart disease of native coronary artery without angina pectoris: Secondary | ICD-10-CM | POA: Insufficient documentation

## 2014-08-07 DIAGNOSIS — F17210 Nicotine dependence, cigarettes, uncomplicated: Secondary | ICD-10-CM | POA: Insufficient documentation

## 2014-08-07 DIAGNOSIS — Z5189 Encounter for other specified aftercare: Secondary | ICD-10-CM | POA: Diagnosis not present

## 2014-08-07 DIAGNOSIS — E785 Hyperlipidemia, unspecified: Secondary | ICD-10-CM | POA: Diagnosis not present

## 2014-08-07 DIAGNOSIS — N183 Chronic kidney disease, stage 3 (moderate): Secondary | ICD-10-CM | POA: Insufficient documentation

## 2014-08-07 DIAGNOSIS — I2582 Chronic total occlusion of coronary artery: Secondary | ICD-10-CM | POA: Diagnosis not present

## 2014-08-07 DIAGNOSIS — Z7982 Long term (current) use of aspirin: Secondary | ICD-10-CM | POA: Insufficient documentation

## 2014-08-07 DIAGNOSIS — E119 Type 2 diabetes mellitus without complications: Secondary | ICD-10-CM | POA: Diagnosis not present

## 2014-08-07 DIAGNOSIS — Z794 Long term (current) use of insulin: Secondary | ICD-10-CM | POA: Diagnosis not present

## 2014-08-07 LAB — GLUCOSE, CAPILLARY: Glucose-Capillary: 176 mg/dL — ABNORMAL HIGH (ref 70–99)

## 2014-08-09 ENCOUNTER — Encounter (HOSPITAL_COMMUNITY)
Admission: RE | Admit: 2014-08-09 | Discharge: 2014-08-09 | Disposition: A | Discharge status: Home / Self Care | Payer: 59 | Source: Ambulatory Visit | Attending: Cardiology | Admitting: Cardiology

## 2014-08-09 ENCOUNTER — Encounter (HOSPITAL_COMMUNITY): Payer: 59

## 2014-08-09 DIAGNOSIS — Z5189 Encounter for other specified aftercare: Secondary | ICD-10-CM | POA: Diagnosis not present

## 2014-08-11 ENCOUNTER — Encounter (HOSPITAL_COMMUNITY): Payer: 59

## 2014-08-11 ENCOUNTER — Encounter (HOSPITAL_COMMUNITY)
Admission: RE | Admit: 2014-08-11 | Discharge: 2014-08-11 | Disposition: A | Discharge status: Home / Self Care | Payer: 59 | Source: Ambulatory Visit | Attending: Cardiology | Admitting: Cardiology

## 2014-08-11 DIAGNOSIS — Z5189 Encounter for other specified aftercare: Secondary | ICD-10-CM | POA: Diagnosis not present

## 2014-08-11 LAB — GLUCOSE, CAPILLARY
Glucose-Capillary: 97 mg/dL (ref 70–99)
Glucose-Capillary: 88 mg/dL (ref 70–99)

## 2014-08-14 ENCOUNTER — Encounter (HOSPITAL_COMMUNITY)
Admission: RE | Admit: 2014-08-14 | Discharge: 2014-08-14 | Disposition: A | Discharge status: Home / Self Care | Payer: 59 | Source: Ambulatory Visit | Attending: Cardiology | Admitting: Cardiology

## 2014-08-14 ENCOUNTER — Encounter (HOSPITAL_COMMUNITY): Payer: 59

## 2014-08-14 DIAGNOSIS — Z5189 Encounter for other specified aftercare: Secondary | ICD-10-CM | POA: Diagnosis not present

## 2014-08-16 ENCOUNTER — Encounter (HOSPITAL_COMMUNITY)
Admission: RE | Admit: 2014-08-16 | Discharge: 2014-08-16 | Disposition: A | Discharge status: Home / Self Care | Payer: 59 | Source: Ambulatory Visit | Attending: Cardiology | Admitting: Cardiology

## 2014-08-16 ENCOUNTER — Encounter (HOSPITAL_COMMUNITY): Payer: 59

## 2014-08-16 DIAGNOSIS — Z5189 Encounter for other specified aftercare: Secondary | ICD-10-CM | POA: Diagnosis not present

## 2014-08-18 ENCOUNTER — Encounter (HOSPITAL_COMMUNITY): Payer: 59

## 2014-08-18 ENCOUNTER — Encounter (HOSPITAL_COMMUNITY)
Admission: RE | Admit: 2014-08-18 | Discharge: 2014-08-18 | Disposition: A | Discharge status: Home / Self Care | Payer: 59 | Source: Ambulatory Visit | Attending: Cardiology | Admitting: Cardiology

## 2014-08-18 DIAGNOSIS — Z5189 Encounter for other specified aftercare: Secondary | ICD-10-CM | POA: Diagnosis not present

## 2014-08-21 ENCOUNTER — Encounter (HOSPITAL_COMMUNITY): Payer: 59

## 2014-08-23 ENCOUNTER — Other Ambulatory Visit: Payer: Self-pay | Admitting: *Deleted

## 2014-08-23 ENCOUNTER — Other Ambulatory Visit: Payer: Self-pay | Admitting: Cardiothoracic Surgery

## 2014-08-23 ENCOUNTER — Encounter (HOSPITAL_COMMUNITY): Payer: 59

## 2014-08-23 ENCOUNTER — Ambulatory Visit
Admission: RE | Admit: 2014-08-23 | Discharge: 2014-08-23 | Disposition: A | Discharge status: Home / Self Care | Payer: 59 | Source: Ambulatory Visit | Attending: Cardiothoracic Surgery | Admitting: Cardiothoracic Surgery

## 2014-08-23 ENCOUNTER — Ambulatory Visit (INDEPENDENT_AMBULATORY_CARE_PROVIDER_SITE_OTHER): Payer: 59 | Admitting: Cardiothoracic Surgery

## 2014-08-23 ENCOUNTER — Encounter (HOSPITAL_COMMUNITY)
Admission: RE | Admit: 2014-08-23 | Discharge: 2014-08-23 | Disposition: A | Discharge status: Home / Self Care | Payer: 59 | Source: Ambulatory Visit | Attending: Cardiology | Admitting: Cardiology

## 2014-08-23 ENCOUNTER — Encounter: Payer: Self-pay | Admitting: Cardiothoracic Surgery

## 2014-08-23 VITALS — BP 118/69 | HR 62 | Resp 18 | Ht 68.0 in | Wt 190.7 lb

## 2014-08-23 DIAGNOSIS — Z951 Presence of aortocoronary bypass graft: Secondary | ICD-10-CM

## 2014-08-23 DIAGNOSIS — I6523 Occlusion and stenosis of bilateral carotid arteries: Secondary | ICD-10-CM

## 2014-08-23 DIAGNOSIS — Z5189 Encounter for other specified aftercare: Secondary | ICD-10-CM | POA: Diagnosis not present

## 2014-08-23 NOTE — Progress Notes (Signed)
PCP is Garlan Fillers, MD Referring Provider is Pamella Pert, MD  Chief Complaint  Patient presents with  . Routine Post Op    now with pain at the end of his sternal incision with a palpable knot...referred by cardiac rehab    HPI: Patient had multivessel CABG late September 2015 Patient was last seen by his surgeon, Dr. Tyrone Sage late October 2015, sternum well-healed and patient released to his cardiologist Patient was sent from the rehabilitation program RN  over concern of his lower sternal incision on placing EKG leads today. Patient states he has had some soreness but denies redness drainage fluctuance fever. The patient is a diabetic on immunosuppressive therapy for his rheumatoid arthritis-Enbrel weekly  Past Medical History  Diagnosis Date  . Coronary artery disease   . Anginal pain   . Shortness of breath   . Asthma   . Diabetes mellitus without complication     type 1  . Arthritis     RA IN HANDS  . Unstable angina pectoris 04/25/2014  . Rheumatoid arthritis involving multiple joints 05/03/2014  . Diabetes 05/03/2014  . CAD (coronary artery disease) 04/27/2014  . Chronic renal disease, stage 3, moderately decreased glomerular filtration rate between 30-59 mL/min/1.73 square meter 05/03/2014    Past Surgical History  Procedure Laterality Date  . Cardiac catheterization  04/25/2014    BY DR Jacinto Halim  . Carpal tunnel release    . Eye surgery      LASER  . Coronary artery bypass graft N/A 04/27/2014    Procedure: CORONARY ARTERY BYPASS GRAFTING on pump using left internal mammary artery to LAD coronary artery, right great saphenous vein graft to diagonal coronary artery with sequential to OM1 and circumflex coronary arteries. Right greater saphenous vein graft to posterior descending coronary artery. ;  Surgeon: Delight Ovens, MD;  Location: [facility] OR;  Service: Open Heart Surgery;  Laterality: N  . Intraoperative transesophageal echocardiogram N/A 04/27/2014   Procedure: INTRAOPERATIVE TRANSESOPHAGEAL ECHOCARDIOGRAM;  Surgeon: Delight Ovens, MD;  Location: [facility] OR;  Service: Open Heart Surgery;  Laterality: N/A;  . Endovein harvest of greater saphenous vein Right 04/27/2014    Procedure: ENDOVEIN HARVEST OF GREATER SAPHENOUS VEIN;  Surgeon: Delight Ovens, MD;  Location: [facility] OR;  Service: Open Heart Surgery;  Laterality: Right;  . Left heart catheterization with coronary angiogram N/A 04/25/2014    Procedure: LEFT HEART CATHETERIZATION WITH CORONARY ANGIOGRAM;  Surgeon: Pamella Pert, MD;  Location: [facility] CATH LAB;  Service: Cardiovascular;  Laterality: N/A;    Family History  Problem Relation Age of Onset  . Prostate cancer Father   . Stomach cancer Mother   . Heart disease Father   . Heart disease Brother     Social History History  Substance Use Topics  . Smoking status: Former Smoker -- 2.00 packs/day for 34 years    Types: Cigarettes    Quit date: 04/24/2014  . Smokeless tobacco: Never Used  . Alcohol Use: Yes     Comment: RARE    Current Outpatient Prescriptions  Medication Sig Dispense Refill  . albuterol (PROVENTIL HFA;VENTOLIN HFA) 108 (90 BASE) MCG/ACT inhaler Inhale 1-2 puffs into the lungs every 4 (four) hours as needed for wheezing or shortness of breath.    Marland Kitchen aspirin 81 MG tablet Take 81 mg by mouth daily.    Marland Kitchen atorvastatin (LIPITOR) 40 MG tablet Take 1 tablet (40 mg total) by mouth daily. 30 tablet 1  . buPROPion (WELLBUTRIN SR) 150 MG  12 hr tablet Take 150 mg by mouth 2 (two) times daily.    . clopidogrel (PLAVIX) 75 MG tablet Take 75 mg by mouth daily.    . Etanercept (ENBREL [place]) Inject into the skin. Takes every Friday. Dose unknown    . insulin glargine (LANTUS) 100 UNIT/ML injection Inject 37 Units into the skin at bedtime.     . insulin lispro (HUMALOG) 100 UNIT/ML injection Inject 0-15 Units into the skin 3 (three) times daily as needed for high blood sugar.     . losartan (COZAAR) 25 MG tablet Take 1 tablet (25  mg total) by mouth daily. 30 tablet 1  . metoprolol tartrate (LOPRESSOR) 25 MG tablet Take 50 mg by mouth 2 (two) times daily.     . Multiple Vitamin (MULTIVITAMIN WITH MINERALS) TABS tablet Take 1 tablet by mouth daily.    . Podiatric Products (UDDERLY SMOOTH FOOT) CREA Apply 1 application topically at bedtime. Apply to feet    . Probiotic Product (PROBIOTIC PO) Take 1 tablet by mouth daily.     No current facility-administered medications for this visit.    Allergies  Allergen Reactions  . Morphine And Related Nausea And Vomiting    Review of Systems  Patient progressing fairly well with cardiac rehabilitation He still feels he has not reach for recovery which is not unexpected He denies angina fever or symptoms of CHF  BP 118/69 mmHg  Pulse 62  Resp 18  Ht 5\' 8"  (1.727 m)  Wt 190 lb 11.2 oz (86.5 kg)  BMI 29.00 kg/m2  SpO2 98% Physical Exam Alert and pleasant Lungs clear Heart rhythm regular Sternum well-healed without instability No evidence of incisional nonhealing. There is a small dermal cyst to the left of the lower sternal incision which is firm and probably an inclusion cyst which is not infected and is less than 1 cm. His left costal margin is slightly prominent but nontender and without evidence of cellulitis  Diagnostic Tests: Chest x-ray PA and lateral personally reviewed showing clear lung fields sternal wires intact cardiac silhouette normal no pleural effusion-normal chest x-ray post CABG  Impression:  well-healed sternal incision Small dermal inclusion cyst to the left of his lower sternal incision not inflamed or infected No surgical issues-continue rehabilitation return as needed  Plan:return as needed

## 2014-08-23 NOTE — Progress Notes (Signed)
Pt arrived at cardiac rehab c/o pain and tenderness end of sternal incision.  Pt states " I feel a lump"  Upon inspection and palpation, area of tenderness with palpable lump noted slightly below sternal incision   Visible swelling present under left breast.  Area of swelling non tender.  Pt reports these findings have been present for past several days.  Pt denies dyspnea, fever, cough or chest pain.  Spoke to Dr. Dennie Maizes nurse who advised pt should have chest xray and work in appt with Dr. Tyrone Sage today.  Pt informed, understanding verbalized.

## 2014-08-25 ENCOUNTER — Encounter (HOSPITAL_COMMUNITY): Payer: 59

## 2014-08-28 ENCOUNTER — Encounter (HOSPITAL_COMMUNITY): Payer: 59

## 2014-08-30 ENCOUNTER — Ambulatory Visit (HOSPITAL_COMMUNITY): Payer: 59 | Attending: Cardiology

## 2014-08-30 ENCOUNTER — Encounter (HOSPITAL_COMMUNITY): Payer: 59

## 2014-08-30 ENCOUNTER — Ambulatory Visit (HOSPITAL_COMMUNITY)
Admission: RE | Admit: 2014-08-30 | Discharge: 2014-08-30 | Disposition: A | Discharge status: Home / Self Care | Payer: 59 | Source: Ambulatory Visit | Attending: Cardiology | Admitting: Cardiology

## 2014-08-30 ENCOUNTER — Encounter (HOSPITAL_COMMUNITY)
Admission: RE | Admit: 2014-08-30 | Discharge: 2014-08-30 | Disposition: A | Discharge status: Home / Self Care | Payer: 59 | Source: Ambulatory Visit | Attending: Cardiology | Admitting: Cardiology

## 2014-08-30 ENCOUNTER — Ambulatory Visit (HOSPITAL_COMMUNITY): Payer: 59

## 2014-08-30 DIAGNOSIS — Z5189 Encounter for other specified aftercare: Secondary | ICD-10-CM | POA: Diagnosis not present

## 2014-08-30 DIAGNOSIS — R9431 Abnormal electrocardiogram [ECG] [EKG]: Secondary | ICD-10-CM | POA: Diagnosis not present

## 2014-08-30 NOTE — Progress Notes (Signed)
(  late entry for 8:45 am)  Pt c/o chest discomfort while walking on treadmill today at cardiac rehab.  Pain reproducible to touch however more prominent with walking than at rest.  Pt describes as achy pain, able to touch exact area of discomfort.  Pt rates as 6/10 with exercise, rates as 0/10 at rest.  EKG obtained, no acute changes,  unchanged from last EKG at Dr. Verl Dicker office 05/26/2014.  Pt scheduled to be seen today by Dr. Jacinto Halim at 11:45.  Pt pain free upon leaving rehab and verbalized appointment instructions.

## 2014-09-01 ENCOUNTER — Encounter (HOSPITAL_COMMUNITY)
Admission: RE | Admit: 2014-09-01 | Discharge: 2014-09-01 | Disposition: A | Discharge status: Home / Self Care | Payer: 59 | Source: Ambulatory Visit | Attending: Cardiology | Admitting: Cardiology

## 2014-09-01 ENCOUNTER — Encounter (HOSPITAL_COMMUNITY): Payer: 59

## 2014-09-01 DIAGNOSIS — Z5189 Encounter for other specified aftercare: Secondary | ICD-10-CM | POA: Diagnosis not present

## 2014-09-04 ENCOUNTER — Encounter (HOSPITAL_COMMUNITY): Payer: 59

## 2014-09-04 ENCOUNTER — Encounter (HOSPITAL_COMMUNITY)
Admission: RE | Admit: 2014-09-04 | Discharge: 2014-09-04 | Disposition: A | Discharge status: Home / Self Care | Payer: 59 | Source: Ambulatory Visit | Attending: Cardiology | Admitting: Cardiology

## 2014-09-04 DIAGNOSIS — Z5189 Encounter for other specified aftercare: Secondary | ICD-10-CM | POA: Diagnosis not present

## 2014-09-04 DIAGNOSIS — Z951 Presence of aortocoronary bypass graft: Secondary | ICD-10-CM | POA: Diagnosis not present

## 2014-09-04 DIAGNOSIS — N183 Chronic kidney disease, stage 3 (moderate): Secondary | ICD-10-CM | POA: Diagnosis not present

## 2014-09-04 DIAGNOSIS — Z794 Long term (current) use of insulin: Secondary | ICD-10-CM | POA: Diagnosis not present

## 2014-09-04 DIAGNOSIS — I2582 Chronic total occlusion of coronary artery: Secondary | ICD-10-CM | POA: Diagnosis not present

## 2014-09-04 DIAGNOSIS — I2510 Atherosclerotic heart disease of native coronary artery without angina pectoris: Secondary | ICD-10-CM | POA: Insufficient documentation

## 2014-09-04 DIAGNOSIS — F17210 Nicotine dependence, cigarettes, uncomplicated: Secondary | ICD-10-CM | POA: Insufficient documentation

## 2014-09-04 DIAGNOSIS — Z79899 Other long term (current) drug therapy: Secondary | ICD-10-CM | POA: Diagnosis not present

## 2014-09-04 DIAGNOSIS — E119 Type 2 diabetes mellitus without complications: Secondary | ICD-10-CM | POA: Diagnosis not present

## 2014-09-04 DIAGNOSIS — Z7982 Long term (current) use of aspirin: Secondary | ICD-10-CM | POA: Diagnosis not present

## 2014-09-04 DIAGNOSIS — E785 Hyperlipidemia, unspecified: Secondary | ICD-10-CM | POA: Insufficient documentation

## 2014-09-06 ENCOUNTER — Encounter (HOSPITAL_COMMUNITY)
Admission: RE | Admit: 2014-09-06 | Discharge: 2014-09-06 | Disposition: A | Discharge status: Home / Self Care | Payer: 59 | Source: Ambulatory Visit | Attending: Cardiology | Admitting: Cardiology

## 2014-09-06 ENCOUNTER — Encounter (HOSPITAL_COMMUNITY): Payer: 59

## 2014-09-06 DIAGNOSIS — Z5189 Encounter for other specified aftercare: Secondary | ICD-10-CM | POA: Diagnosis not present

## 2014-09-06 NOTE — Progress Notes (Signed)
Pt reported at cardiac rehab today he has resumed smoking cigarettes, about 1/2 pack per day.  Pt states he feels stress at work and home is what has caused him to relapse.  Pt also reports his wife and daughter smoke although not in the home.  Pt verbalizes desire and readiness to quit because he knows the health benefits of not smoking and the risk associated with continued smoking. Pt given smoking cessation literature including calling 1800quitnow and quitsmart class information.  Pt also given fake cigarette for hand mouth addiction.  Pt verbalized understanding of smoking cessation counseling.

## 2014-09-08 ENCOUNTER — Encounter (HOSPITAL_COMMUNITY): Payer: 59

## 2014-09-08 ENCOUNTER — Encounter (HOSPITAL_COMMUNITY)
Admission: RE | Admit: 2014-09-08 | Discharge: 2014-09-08 | Disposition: A | Discharge status: Home / Self Care | Payer: 59 | Source: Ambulatory Visit | Attending: Cardiology | Admitting: Cardiology

## 2014-09-08 DIAGNOSIS — Z5189 Encounter for other specified aftercare: Secondary | ICD-10-CM | POA: Diagnosis not present

## 2014-09-11 ENCOUNTER — Encounter (HOSPITAL_COMMUNITY): Payer: 59

## 2014-09-11 ENCOUNTER — Encounter (HOSPITAL_COMMUNITY)
Admission: RE | Admit: 2014-09-11 | Discharge: 2014-09-11 | Disposition: A | Discharge status: Home / Self Care | Payer: 59 | Source: Ambulatory Visit | Attending: Cardiology | Admitting: Cardiology

## 2014-09-11 DIAGNOSIS — Z5189 Encounter for other specified aftercare: Secondary | ICD-10-CM | POA: Diagnosis not present

## 2014-09-13 ENCOUNTER — Encounter (HOSPITAL_COMMUNITY): Payer: 59

## 2014-09-13 ENCOUNTER — Encounter (HOSPITAL_COMMUNITY)
Admission: RE | Admit: 2014-09-13 | Discharge: 2014-09-13 | Disposition: A | Discharge status: Home / Self Care | Payer: 59 | Source: Ambulatory Visit | Attending: Cardiology | Admitting: Cardiology

## 2014-09-13 DIAGNOSIS — Z5189 Encounter for other specified aftercare: Secondary | ICD-10-CM | POA: Diagnosis not present

## 2014-09-15 ENCOUNTER — Encounter (HOSPITAL_COMMUNITY): Payer: Self-pay

## 2014-09-15 ENCOUNTER — Encounter (HOSPITAL_COMMUNITY): Payer: 59

## 2014-09-15 ENCOUNTER — Encounter (HOSPITAL_COMMUNITY)
Admission: RE | Admit: 2014-09-15 | Discharge: 2014-09-15 | Disposition: A | Discharge status: Home / Self Care | Payer: 59 | Source: Ambulatory Visit | Attending: Cardiology | Admitting: Cardiology

## 2014-09-15 DIAGNOSIS — Z5189 Encounter for other specified aftercare: Secondary | ICD-10-CM | POA: Diagnosis not present

## 2014-09-15 NOTE — Progress Notes (Signed)
Pt graduated from cardiac rehab program today with completion of 27 exercise sessions in Phase II. Pt exiting program early to return to work fulltime.   Pt maintained good attendance and progressed nicely during his participation in rehab as evidenced by increased MET level.   Medication list reconciled. Repeat  PHQ score-0.  Pt still battling situational stress at home due to unhappy marriage.  However pt reports the burden of stress is declining.  Pt has made significant lifestyle changes and should be commended for his success. Pt feels he has achieved his goals during cardiac rehab which were to return to work, be more confident with activity and understanding of his cardiac disease process.  Unfortunately pt has resumed smoking due to stress at home.  Pt states he does not smoke at all during the day at work however smokes in the evenings.  Pt decrease smoking pt states he is currently using Ecigarette.  Pt cautioned about the potential harmful effects of Ecigarettes and urged to stop.  Pt verbalized understanding.   Pt plans to exercise on his own at workplace gym.

## 2014-09-18 ENCOUNTER — Encounter (HOSPITAL_COMMUNITY): Payer: 59

## 2014-09-20 ENCOUNTER — Encounter (HOSPITAL_COMMUNITY): Payer: 59

## 2014-09-22 ENCOUNTER — Encounter (HOSPITAL_COMMUNITY): Payer: 59

## 2014-09-25 ENCOUNTER — Encounter (HOSPITAL_COMMUNITY): Payer: 59

## 2014-09-27 ENCOUNTER — Encounter (HOSPITAL_COMMUNITY): Payer: 59

## 2014-09-28 DIAGNOSIS — J209 Acute bronchitis, unspecified: Secondary | ICD-10-CM | POA: Insufficient documentation

## 2014-09-29 ENCOUNTER — Encounter (HOSPITAL_COMMUNITY): Payer: 59

## 2014-10-02 ENCOUNTER — Encounter (HOSPITAL_COMMUNITY): Payer: 59

## 2014-10-04 ENCOUNTER — Encounter (HOSPITAL_COMMUNITY): Payer: 59

## 2014-10-06 ENCOUNTER — Encounter (HOSPITAL_COMMUNITY): Payer: 59

## 2014-10-09 ENCOUNTER — Encounter (HOSPITAL_COMMUNITY): Payer: 59

## 2014-10-11 ENCOUNTER — Encounter (HOSPITAL_COMMUNITY): Payer: 59

## 2014-10-13 ENCOUNTER — Encounter (HOSPITAL_COMMUNITY): Payer: 59

## 2014-10-16 ENCOUNTER — Encounter (HOSPITAL_COMMUNITY): Payer: 59

## 2014-10-18 ENCOUNTER — Encounter (HOSPITAL_COMMUNITY): Payer: 59

## 2014-10-20 ENCOUNTER — Encounter (HOSPITAL_COMMUNITY): Payer: 59

## 2014-10-23 ENCOUNTER — Encounter (HOSPITAL_COMMUNITY): Payer: 59

## 2014-10-25 ENCOUNTER — Encounter (HOSPITAL_COMMUNITY): Payer: 59

## 2014-10-27 ENCOUNTER — Encounter (HOSPITAL_COMMUNITY): Payer: 59

## 2015-08-16 ENCOUNTER — Telehealth: Payer: Self-pay | Admitting: Internal Medicine

## 2015-08-16 NOTE — Telephone Encounter (Signed)
Referral received from PCP. Pt would like a second opinion. He states he had a colonoscopy on 06-29-16, and ever since he's had bloating, gas and irreguler BM's. He would like a second opinion, because he believes there is a serious problem with his stomach. Pt will obtain colon report and path, and fax for review.

## 2015-08-22 ENCOUNTER — Encounter: Payer: Self-pay | Admitting: Internal Medicine

## 2015-09-18 IMAGING — CR DG CHEST 2V
2 series · 2 of 2 positions shown · non-contrast
Comparison: None.

CLINICAL DATA: Preop CABG

EXAM:
CHEST  2 VIEW

[w chest pa]
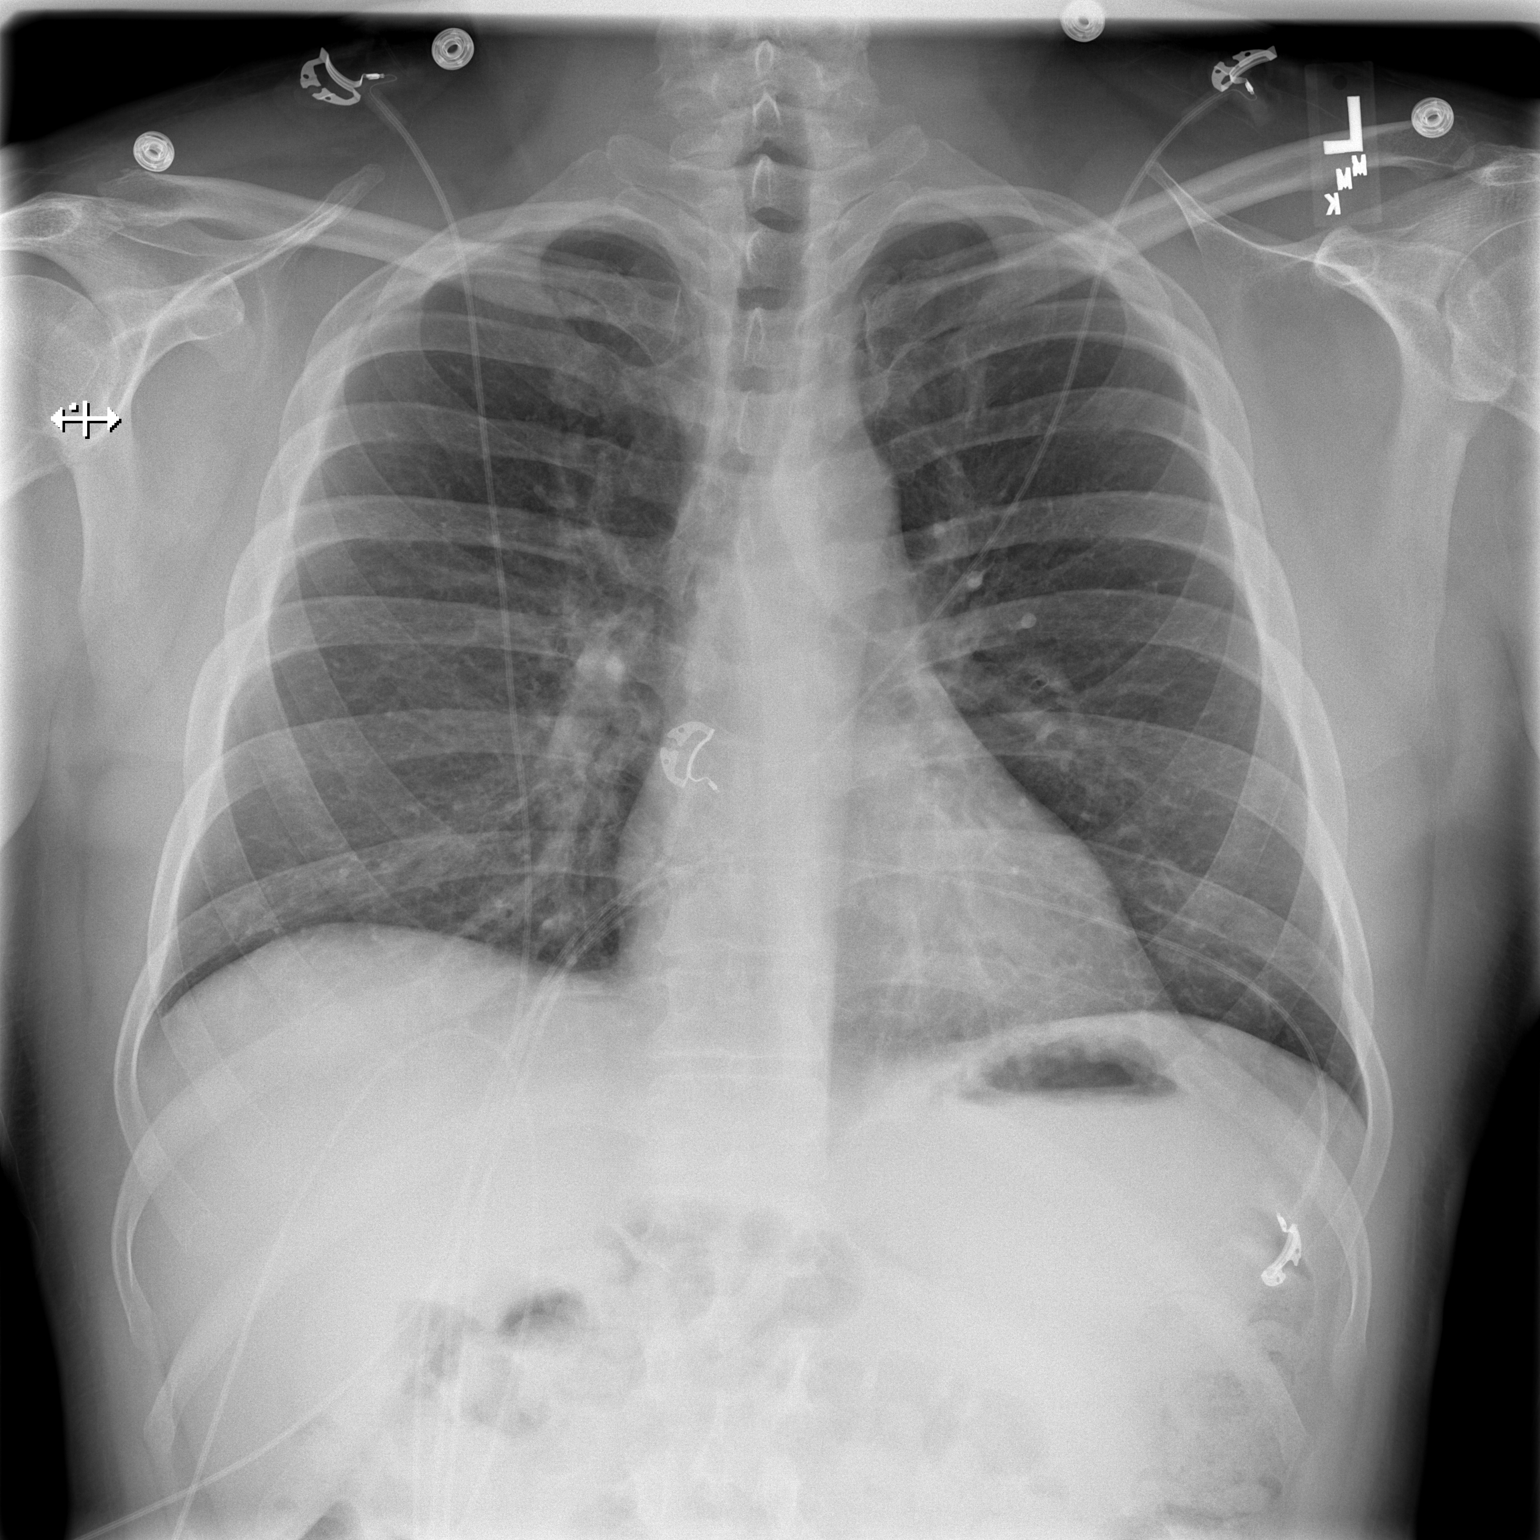

[w chest lat]
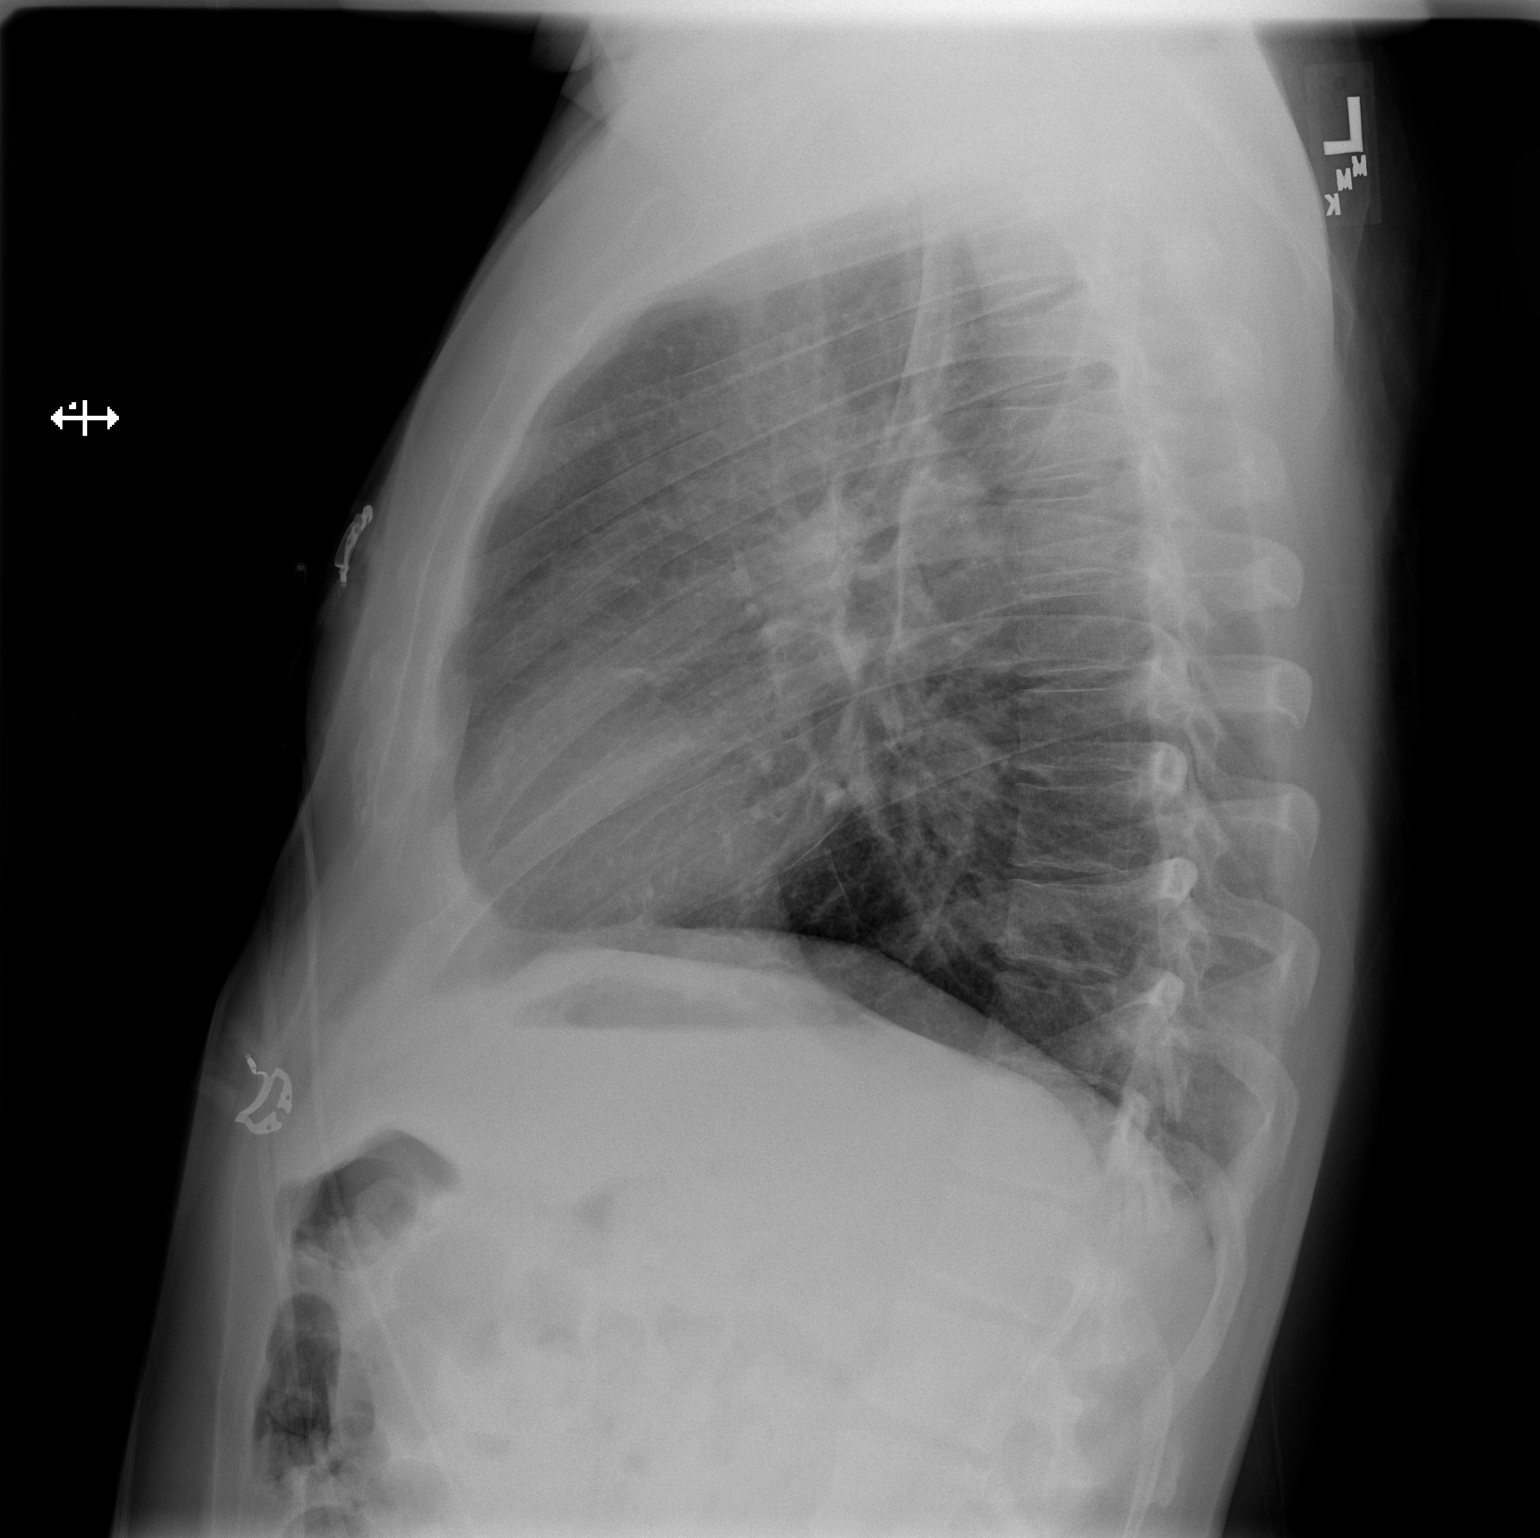

[2 of 2 positions shown; findings below may reference images not displayed]

FINDINGS: The heart size and mediastinal contours are within normal limits.
Both lungs are clear. The visualized skeletal structures are
unremarkable.
IMPRESSION: No active cardiopulmonary disease.

## 2015-09-21 IMAGING — CR DG CHEST 1V PORT
1 series · 1 of 1 positions shown · non-contrast
Comparison: 04/27/2014

CLINICAL DATA: Status post coronary bypass grafting

EXAM:
PORTABLE CHEST - 1 VIEW

[portable]
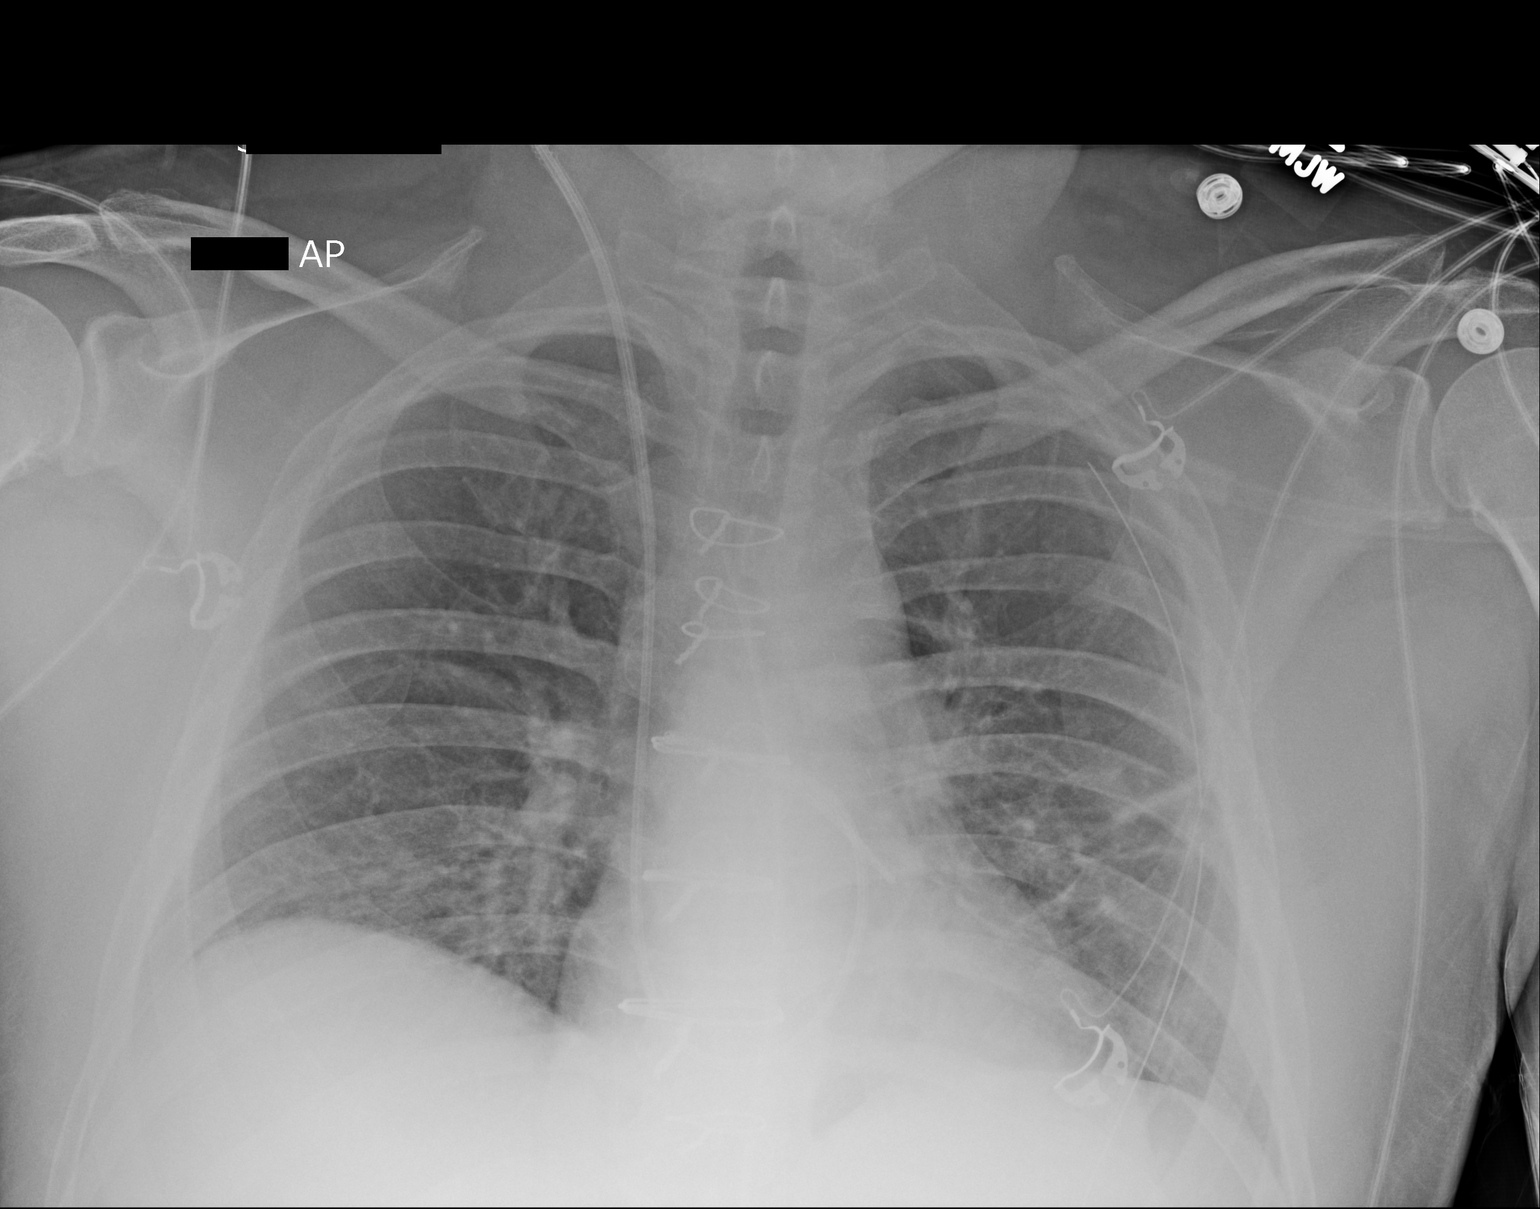

[1 of 1 positions shown; findings below may reference images not displayed]

FINDINGS: The endotracheal tube and nasogastric catheter have been removed in
the interval. A Swan-Ganz catheter is again seen coiled within the
main pulmonary artery with the tip directed towards the ventricle. A
left-sided chest tube is again seen and stable. The mediastinal
drain is again noted and stable. Very minimal left midlung
atelectasis is seen stable from the prior study.
IMPRESSION: Interval removal of ET tube and nasogastric catheter.

Remainder of the study is stable from the prior exam.

## 2015-09-27 IMAGING — CR DG CHEST 2V
2 series · 2 of 2 positions shown · non-contrast
Comparison: PA and lateral chest of May 02, 2014.

CLINICAL DATA: Status post CABG on April 27, 2014

EXAM:
CHEST  2 VIEW

[w chest pa]
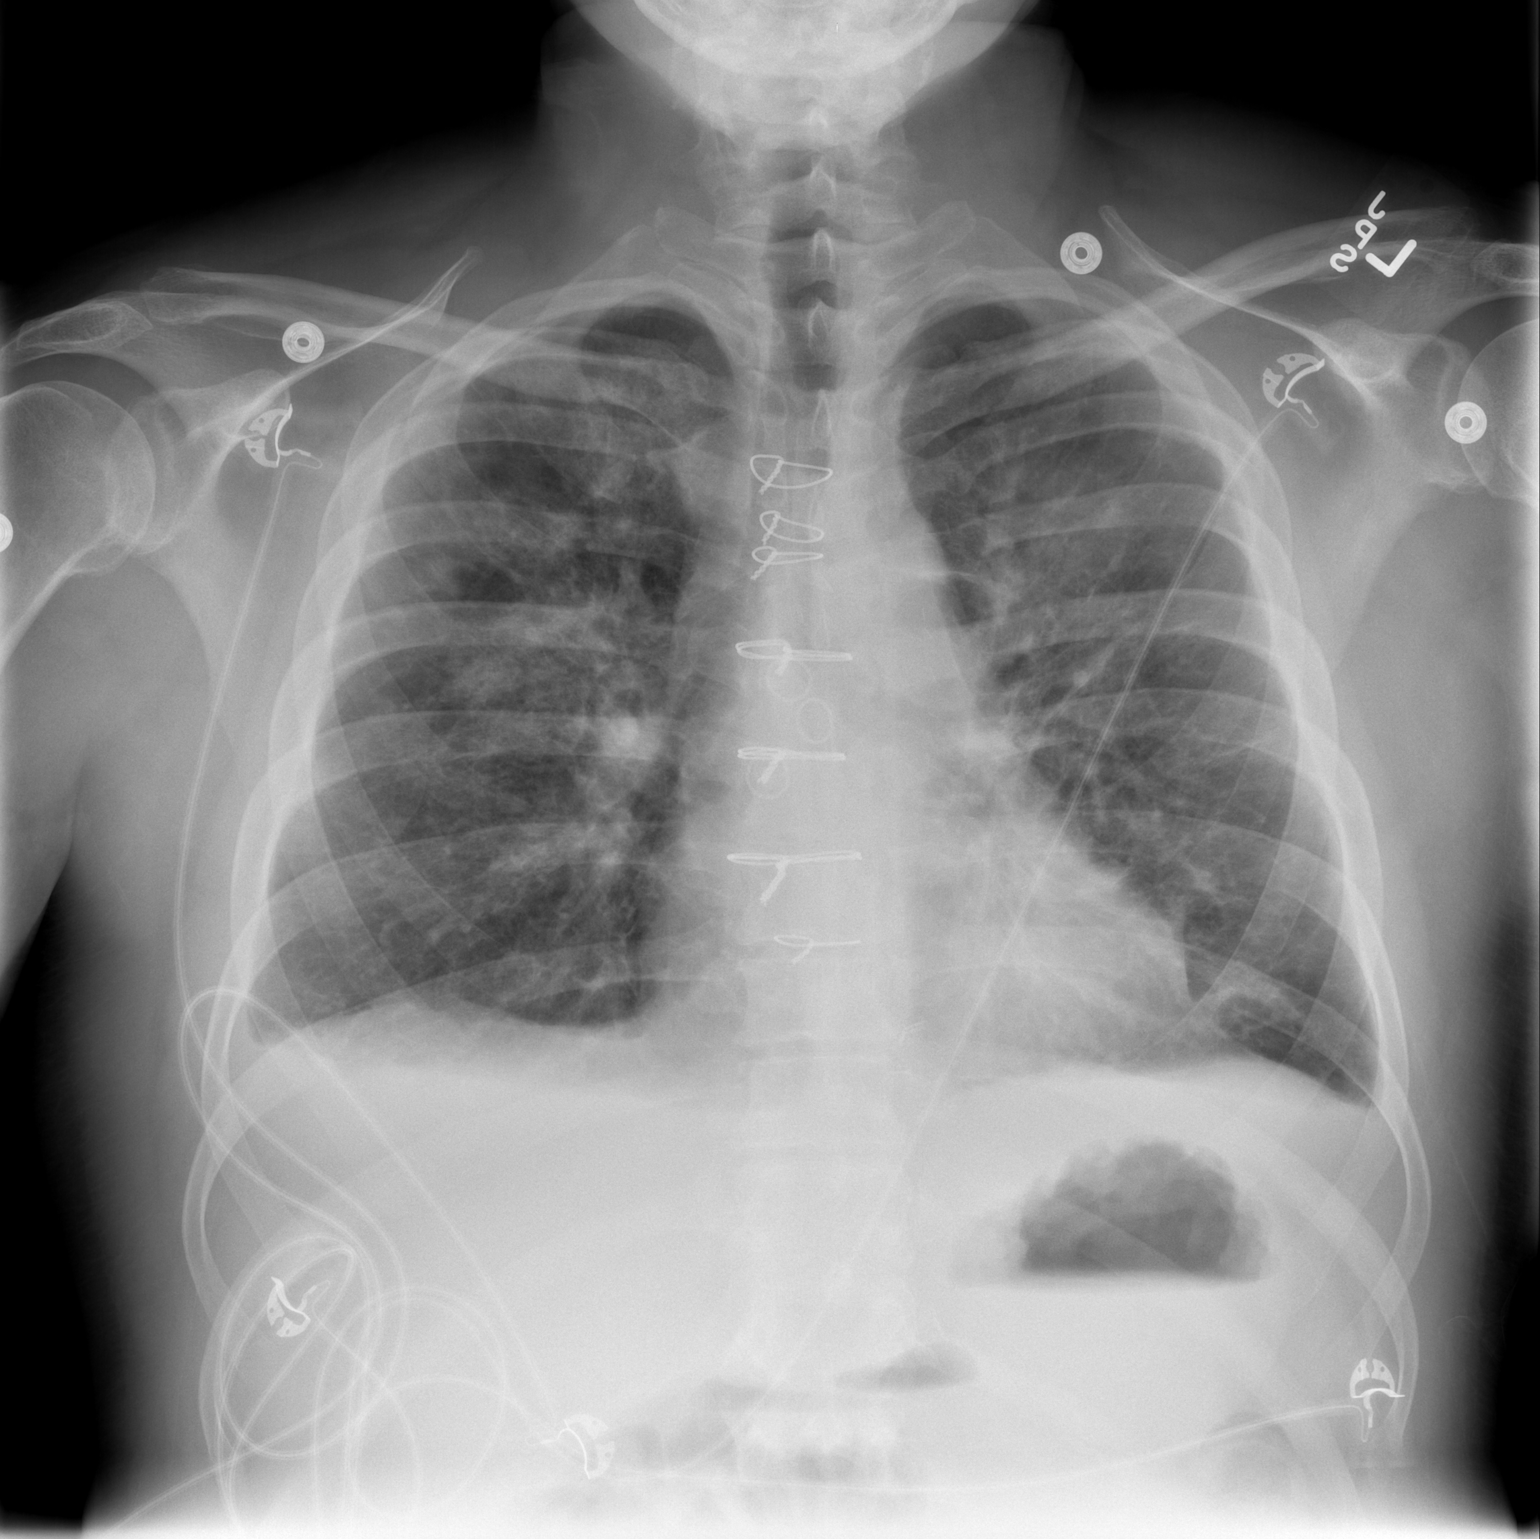

[w chest lat]
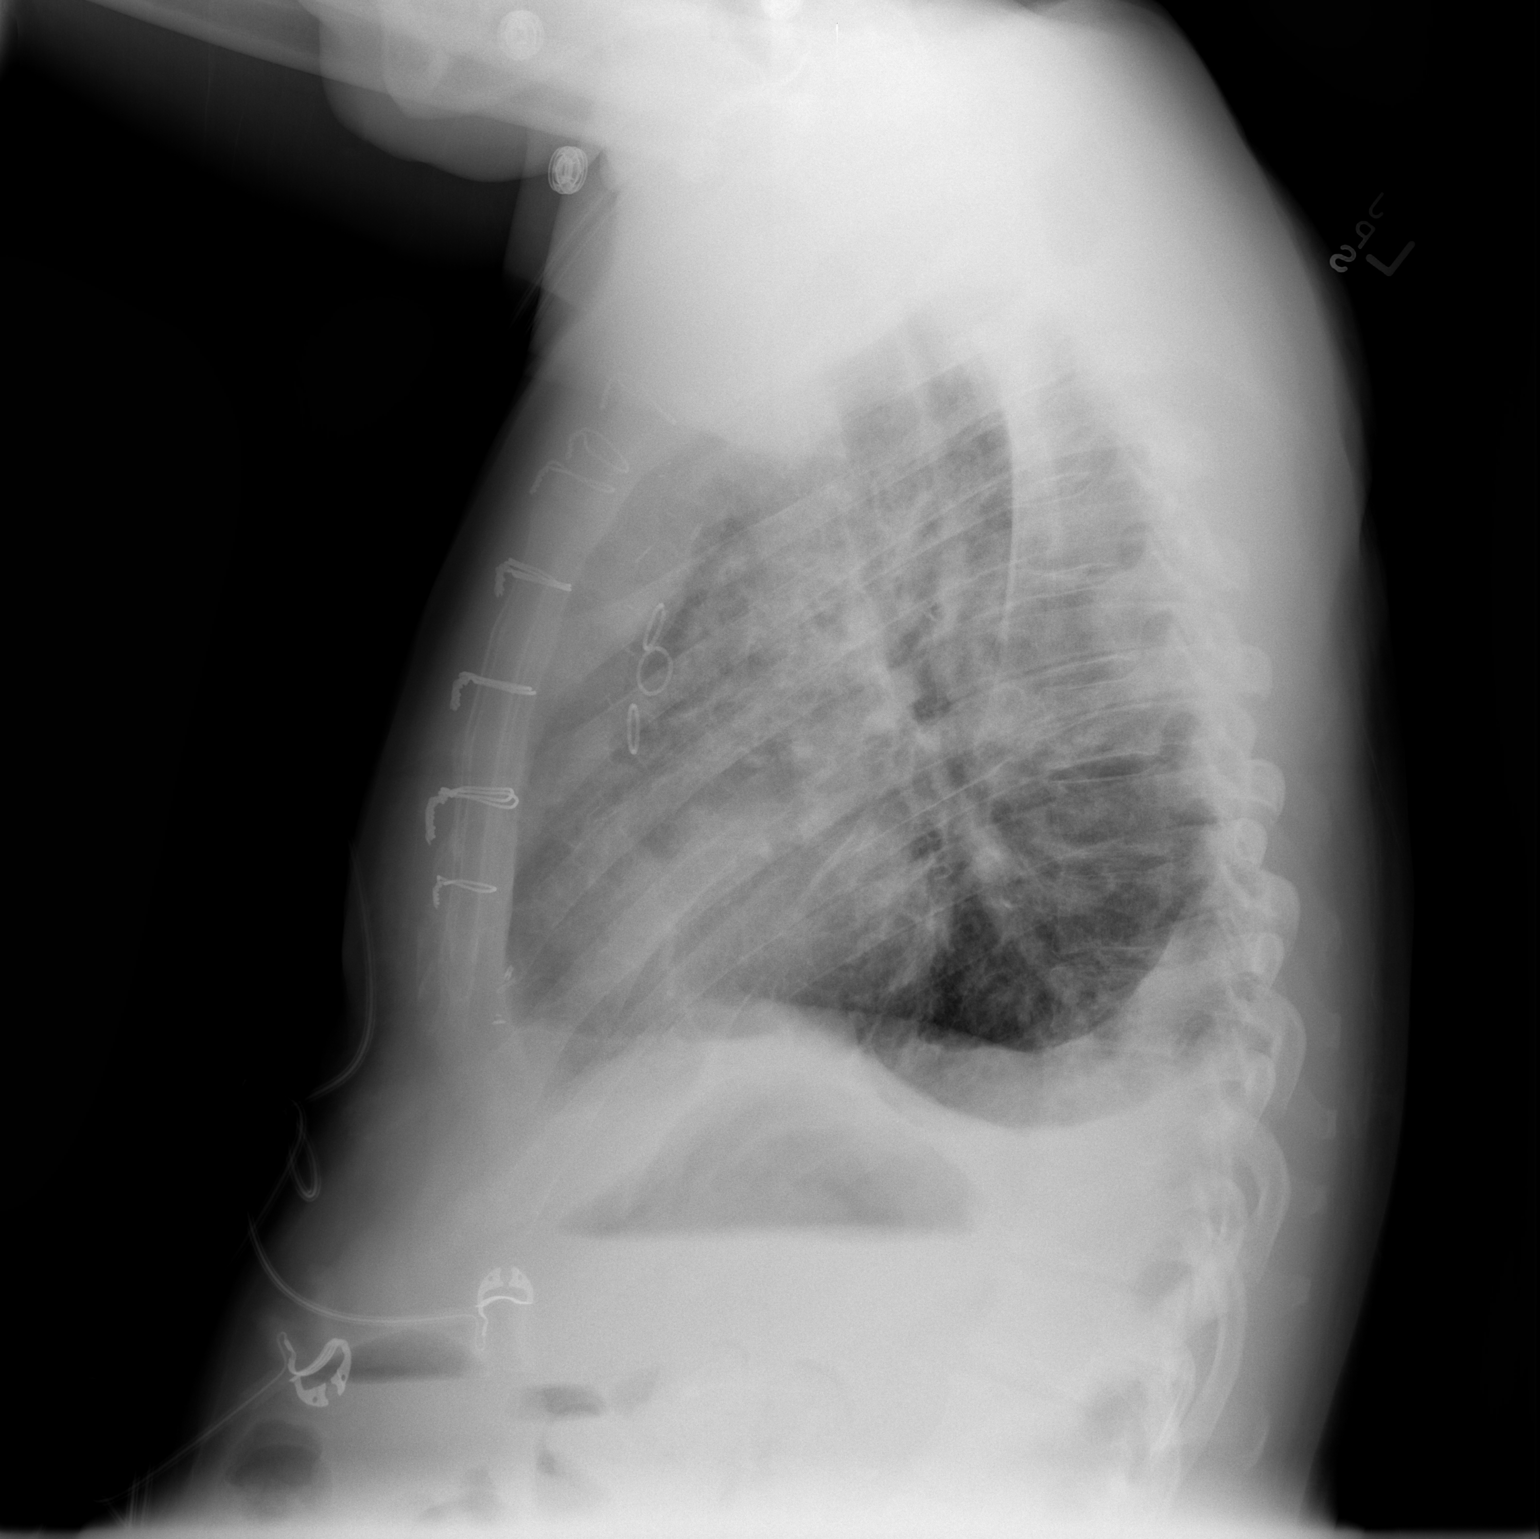

[2 of 2 positions shown; findings below may reference images not displayed]

FINDINGS: The tiny left-sided pneumothorax is no longer evident. There remain
small bilateral pleural effusions layering posteriorly and
laterally. The effusion on the right has improved significantly. The
pulmonary interstitial markings are increased and subtle confluent
alveolar density is present in the perihilar regions. The cardiac
silhouette is normal in size. There are 7 intact sternal wires.
There are 3 coronary artery bypass marker rings.
IMPRESSION: There has been further interval decrease in the volume of the right
pleural effusion. The tiny left pneumothorax resolved. The pulmonary
interstitium remains prominent consistent with mild interstitial
edema or less likely early pneumonia.

## 2015-10-09 ENCOUNTER — Other Ambulatory Visit: Payer: Self-pay | Admitting: Cardiology

## 2015-10-09 ENCOUNTER — Ambulatory Visit
Admission: RE | Admit: 2015-10-09 | Discharge: 2015-10-09 | Disposition: A | Discharge status: Home / Self Care | Payer: 59 | Source: Ambulatory Visit | Attending: Cardiology | Admitting: Cardiology

## 2015-10-09 ENCOUNTER — Other Ambulatory Visit: Payer: Self-pay | Admitting: Cardiothoracic Surgery

## 2015-10-09 ENCOUNTER — Ambulatory Visit
Admission: RE | Admit: 2015-10-09 | Discharge: 2015-10-09 | Disposition: A | Discharge status: Home / Self Care | Payer: 59 | Source: Ambulatory Visit | Attending: Cardiothoracic Surgery | Admitting: Cardiothoracic Surgery

## 2015-10-09 DIAGNOSIS — R0602 Shortness of breath: Secondary | ICD-10-CM

## 2015-10-09 DIAGNOSIS — I2510 Atherosclerotic heart disease of native coronary artery without angina pectoris: Secondary | ICD-10-CM

## 2015-10-29 DIAGNOSIS — R079 Chest pain, unspecified: Secondary | ICD-10-CM | POA: Diagnosis present

## 2015-10-29 NOTE — H&P (Signed)
OFFICE VISIT NOTES COPIED TO EPIC FOR DOCUMENTATION  . [name] 10/23/2015 8:10 AM Location: [facility] Patient #: [id number] DOB: 07/15/1966 Separated / Language: Cleophus Molt / Race: White Male   History of Present Illness Neldon Labella AGNP-C; 10/23/2015 11:33 AM) The patient is a 50 year old male who presents for a Follow-up for Coronary artery disease. Caucasian male with a history of Type I Diabetes, rheumatoid arthritis and hyperlipidemia who presents for follow up CAD. Due to severe triple-vessel coronary artery disease he underwent coronary artery bypass grafting on 04/27/2014.   He presented for follow-up due to intermittent episodes of chest pain over the past 2 weeks. He states pain feels similar to when he intially presented, although, not exactly the same. It does not necessarily require exertion. He states pain has not become severe enough to use a nitroglycerin and usually resolves within 20-30 minutes. He states he has been under an extreme amount of stress lately and is going through a separation and has begun smoking intermittently again. He denies SOB, PND, orthopnea, dizziness, or edema. DM remains well controlled. tolerating all medications well. Occasional leg cramping with activity, non limiting. He has changed jobs and is now working the [employer]   Problem List/Past Medical Anderson Malta Sergeant; 10/23/2015 11:12 AM) Family history of coronary arteriosclerosis (Z82.49) Father. Deceased. at age 55 from prostate cancer. Had MI at age 25. 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 Labs 03/12/2015: Total cholesterol 118, triglycerides 100, HDL 23, LDL 75. Labs 11/24/2014: Total cholesterol 184, triglycerides 152, HDL 37, LDL 117, TSH  6.37, T4/T3 normal, HbA1c 5.9% Chest pain, atypical (R07.89) Shortness of breath (R06.02) Chest x-ray 10/09/2015: No active pulmonary disease. Normal chest x-ray. 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 Estanislado Pandy 07/18/2014: History of rheumatoid arthritis and positive rheumatoid factor, no back on Enbryl due to acute flare. Coronary artery disease involving native coronary artery of native heart with angina pectoris (I25.119)04/25/2014 Lexiscan sestamibi stress test 10/12/2015: 1. The resting electrocardiogram demonstrated normal sinus rhythm, normal resting conduction, no resting arrhythmias and T changes. Stress EKG is non-diagnostic for ischemia as it a pharmacologic stress using Lexiscan. Stress test converted to Lexiscan due to exerise intolerence and dyspnea. 2. The perfusion imaging study demonstrates a medium size severe ischemia in the inferior and inferolateral wall extending from the base towards the apex. Left ventricular systolic function was normal with ejection fraction of 69%. This is a high risk study, consider further cardiac work-up. CABG 04/27/2014: LIMA to LAD, SVG to D1, SVG to OM1 and distal circumflex, SVG to PDA. Dr. Ceasar Mons. Carotid artery stenosis, asymptomatic, bilateral (I65.23)04/25/2014 Carotid artery duplex 10/24/2014: Moderate stenosis in the left internal carotid artery (16-49%). Follow up in one year is appropriate if clinically indicated. No significant change since 04/25/2014. H/O four vessel coronary artery bypass graft (Z95.1)04/27/2014 Office visit 08/23/2014 Prescott Gum, MD): Postop visit for pain and palpable knot that in the sternal incision, does not appear to be infected, follow-up as needed OV 06/08/2014 Servando Snare, M.D.): Chest x-ray clear of effusions. Wait 3 more months before returning to work. Otherwise, no changes. CABG 04/27/2014: LIMA to LAD, SVG to D1, SVG to  OM1 and distal circumflex, SVG to PDA. Dr. Ceasar Mons. Diabetes mellitus with stage 3 chronic  kidney disease (E11.22)06/15/2014  Allergies Anderson Malta Sergeant; 2015/10/28 11:12 AM) Codeine/Codeine Derivatives Nausea, Vomiting. Morphine Sulfate (Concentrate) *ANALGESICS - OPIOID* Nausea.  Family History Anderson Malta Sergeant; 10/28/2015 11:12 AM) Mother Deceased. At age 41 of Cancer, had Epilepsy, arthritis. No heart related conditions Father Deceased. at age 24 from prostate cancer. Had MI at age 2 Sister 1 1- 39 months older. No heart conditions known Brother 1 1/2 Brother 71 years older had open heart surgery at age 47 years of age  Social History (April Garrison; 2015/10/28 11:23 AM) Current tobacco use Current some day smoker. Alcohol Use Occasional alcohol use. Marital status Separated. Number of Children 3. Living Situation Granddaughter lives with him  Past Surgical History Anderson Malta Sergeant; 2015/10/28 11:12 AM) Nasal Septum [id number] Laser RX, OU12/2004 Carpal Tunnel Release and Elbow Ulnar Nerve Decomp, and Transposition2008 Left. Bilateral Shoulder joint adhesions release2011 Coronary Artery Bypass, Five09/24/2015 LIMA to LAD, SVG to D1, SVG to OM1 and distal circumflex, SVG to PDA. Dr. Ceasar Mons.  Medication History (April Garrison; 28-Oct-2015 11:25 AM) Nitrostat (0.4MG Tab Sublingual, 1 Tablet Sublingual every 5 minutes as needed for chest pain., Taken starting 10/01/2015) Active. Plavix (75MG Tablet, 1 (one) Tablet Oral daily, Taken starting 10/01/2015) Active. Metoprolol Tartrate (50MG Tablet, 1 (one) Tablet Tablet Oral two times daily, Taken starting 09/21/2015) Active. BuPROPion HCl ER (SR) (150MG Tablet ER 12HR, 1 (one) Tablet ER 12HR Tablet Oral two times daily 8am and 3 pm, Taken starting 06/22/2015) Active. Ventolin HFA (108 (90 Base)MCG/ACT Aerosol Soln, 1 to 2 puffs Inhalation up to 4 times a day as needed) Active. Cialis (20MG Tablet, 1  Oral as needed) Active. HumaLOG (100UNIT/ML Solution, 1 Subcutaneous carb counting 5 to 10 U on a sliding scale) Active. Lantus (100UNIT/ML Solution, 37 Units Subcutaneous daily) Active. Aspirin EC (81MG Tablet DR, 1 Oral daily) Active. Docusate Sodium (100MG Capsule, 2 Oral daily) Active. Probiotic (2 Oral daily) Active. Cod Liver Oil (1 Oral daily) Active. Centrum Silver (1 Oral daily) Discontinued: Patient Choice. Albuterol Sulfate ((2.5 MG/3ML)0.083% Nebulized Soln, 1 Inhalation as needed in Nebulizer) Active. Enbrel SureClick (59YV/OP Soln Auto-inj, 1 Subcutaneous weekly for Arthritis) Active. (Follows Dr. Bo Merino. Rheumatoid arthritis) Medications Reconciled  Diagnostic Studies History Anderson Malta Sergeant; 28-Oct-2015 11:13 AM) Carotid Doppler03/16/2017 Stenosis in the left internal carotid artery (16-49%). Diffuse mixed plaque in bilateral carotids. Antegrade right vertebral artery flow. Antegrade left vertebral artery flow. Follow up in one year is appropriate if clinically indicated. No significant change from 10/24/2014. Nuclear stress test03/05/2016 1. The resting electrocardiogram demonstrated normal sinus rhythm, normal resting conduction, no resting arrhythmias and T changes. Stress EKG is non-diagnostic for ischemia as it a pharmacologic stress using Lexiscan. Stress test converted to Lexiscan due to exerise intolerence and dyspnea. 2. The perfusion imaging study demonstrates a medium size severe ischemia in the inferior and inferolateral wall extending from the base towards the apex. Left ventricular systolic function was normal with ejection fraction of 69%. This is a high risk study, consider further cardiac work-up. Chest X-ray03/02/2016 No active pulmonary disease. Normal chest x-ray. ESOPHOGRAM / BARIUM SWALLOW01/02/2009 Colonoscopy01/08/2009 by : Dr. Collene Mares Coronary Angiogram09/22/2015 Left heart catheterization including hemodynamic monitoring of the left  ventricle, LV gram, selective right and left coronary arteriography. Left subclavian and right subclavian arteriogram with IMA angiography. Carotid duplex09/22/2015 Bilateral 40-59% ICA stenosis. ABI09/22/2015 Mild decrease in left ABI I 0.9, right normal ABI 0.97, Monophasic waveform evident in the small vessels below the knee. Echocardiogram11/12/2013 - Left ventricle cavity is normal in size. Moderate concentric hypertrophy  of the left ventricle. Normal global wall motion. Visual EF is 60-65%. Calculated EF 56%. - Trileaflet aortic valve with no regurgitation noted. Mild calcification of the aortic valve annulus. - Mild mitral regurgitation. Mild calcification of the mitral valve annulus. - Mild tricuspid regurgitation. No evidence of pulmonary hypertension. Colonscopy11/23/2016 Normal.    Review of Systems [facility] Ebony Hail, [place]; 10/23/2015 11:50 AM) General Present- Feeling well. Not Present- Anorexia, Fatigue, Fever and Weight Gain > 10lbs.Marland Kitchen Respiratory Present- Decreased Exercise Tolerance. Not Present- Cough and Wakes up from Sleep Wheezing or Short of Breath. Cardiovascular Present- Chest Pain. Not Present- Claudications, Edema, Orthopnea, Paroxysmal Nocturnal Dyspnea and Shortness of Breath. Gastrointestinal Not Present- Change in Bowel Habits, Constipation and Nausea. Musculoskeletal Present- Joint Pain (chronic, RA). Neurological Not Present- Focal Neurological Symptoms. Endocrine Not Present- Appetite Changes, Cold Intolerance and Heat Intolerance. Hematology Not Present- Anemia, Petechiae and Prolonged Bleeding. All other systems negative  Vitals (April Garrison; 10/23/2015 11:28 AM) 10/23/2015 11:19 AM Weight: 190.19 lb Height: 68in Body Surface Area: 2 m Body Mass Index: 28.92 kg/m  Pulse: 61 (Regular)  P.OX: 98% (Room air) BP: 128/68 (Sitting, Left Arm, Standard)       Physical Exam (Bridgette Ebony Hail, AGNP-C; 10/23/2015 11:50 AM) General Mental  Status-Alert. General Appearance-Cooperative, Appears older than stated age, Not in acute distress. Orientation-Oriented X3. Build & Nutrition-Well built(overweight).  Head and Neck Thyroid Gland Characteristics - no palpable nodules, no palpable enlargement.  Chest and Lung Exam Palpation Tender - No chest wall tenderness. Auscultation Breath sounds - Clear.  Cardiovascular Inspection Jugular vein - Right - No Distention. Auscultation Heart Sounds - S1 WNL, S2 WNL and No gallop present. Murmurs & Other Heart Sounds - Murmur - No murmur.  Abdomen Palpation/Percussion Normal exam - Non Tender and No hepatosplenomegaly. Auscultation Normal exam - Bowel sounds normal.  Peripheral Vascular Lower Extremity Inspection - Left - No Pigmentation, No Varicose veins. Right - No Pigmentation, No Varicose veins. Palpation - Edema - Left - No edema. Right - No edema. Femoral pulse - Left - Normal. Right - Normal. Popliteal pulse - Left - Normal. Right - Normal. Dorsalis pedis pulse - Left - Normal. Right - Normal. Posterior tibial pulse - Left - Normal. Right - Normal. Carotid arteries - Bilateral-No Carotid bruit(absent right radial pulse since coronary angiogram. Normal Brachial pulse). Abdomen-No prominent abdominal aortic pulsation, No epigastric bruit.  Neurologic Motor-Grossly intact without any focal deficits.  Musculoskeletal Global Assessment Left Lower Extremity - normal range of motion without pain. Right Lower Extremity - normal range of motion without pain.    Assessment & Plan (Bridgette Ebony Hail AGNP-C; 10/23/2015 11:49 AM)  Chest pain, atypical (R07.89)  Coronary artery disease involving native coronary artery of native heart with angina pectoris (I25.119) Story: Lexiscan sestamibi stress test 10/12/2015: 1. The resting electrocardiogram demonstrated normal sinus rhythm, normal resting conduction, no resting arrhythmias and T changes. Stress EKG is  non-diagnostic for ischemia as it a pharmacologic stress using Lexiscan. Stress test converted to Lexiscan due to exerise intolerence and dyspnea. 2. The perfusion imaging study demonstrates a medium size severe ischemia in the inferior and inferolateral wall extending from the base towards the apex. Left ventricular systolic function was normal with ejection fraction of 69%. This is a high risk study, consider further cardiac work-up.  CABG 04/27/2014: LIMA to LAD, SVG to D1, SVG to OM1 and distal circumflex, SVG to PDA. Dr. Ceasar Mons. Impression: EKG 10/01/2015: Sinus bradycardia at a rate of 55 bpm, left atrial abnormality, inferolateral ST  depression and T-wave abnormality. No significant change from EKG on 08/05/2014.  Labwork Story: Labs 06/15/2014: BUN 15, serum creatinine 1.46 with eGFR 55 mL. Labs 03/12/2015: Total cholesterol 118, triglycerides 100, HDL 23, LDL 75.  Labs 11/24/2014: Total cholesterol 184, triglycerides 152, HDL 37, LDL 117, TSH 6.37, T4/T3 normal, HbA1c 5.9%   Current Plans Mechanism of underlying disease process and action of medications discussed with the patient. He presents for follow-up of recent nuclear stress test performed for symptoms consistent with angina. Stress test revealed a medium-size severe ischemia in the inferior and inferolateral wall extending from the base towards the apex with preserved LVEF of 69%. Given abnormal stress test and symptoms, I had already called patient and recommended proceeding with coronary angiogram which has been scheduled for next Tuesday. We discussed regarding risks, benefits, alternatives to this including CTA and continued medical therapy. Patient wants to proceed. Understands <1-2% risk of death, stroke, MI, urgent CABG, bleeding, infection, renal failure but not limited to these. He has been complaining of significant palpitations over the past several days. We'll place an event monitor until next week to exclude any arrhythmias  due to ischemia. Follow-up after cath as previously scheduled. Pt is extremely concerned about returning to work following cath as he feels he has not been able to perform, both cognitively or physically, as well as he should since his CABG. We will discuss this further at follow up. Addendum Note(Bridgette Allison AGNP-C; 10/28/2015 1:49 PM) 10/25/2015: CBC normal, PT/INR normal, creatinine 1.33, potassium 4.3, BMP normal  Labs stable to proceed with coronary angiogram.     Signed by Neldon Labella, AGNP-C (10/23/2015 11:50 AM)

## 2015-10-30 ENCOUNTER — Ambulatory Visit (HOSPITAL_COMMUNITY)
Admission: RE | Admit: 2015-10-30 | Discharge: 2015-10-30 | Disposition: A | Discharge status: Home / Self Care | Payer: 59 | Source: Ambulatory Visit | Attending: Cardiology | Admitting: Cardiology

## 2015-10-30 ENCOUNTER — Encounter (HOSPITAL_COMMUNITY)
Admission: RE | Disposition: A | Discharge status: Home / Self Care | Payer: Self-pay | Source: Ambulatory Visit | Attending: Cardiology

## 2015-10-30 DIAGNOSIS — Z951 Presence of aortocoronary bypass graft: Secondary | ICD-10-CM | POA: Diagnosis not present

## 2015-10-30 DIAGNOSIS — E785 Hyperlipidemia, unspecified: Secondary | ICD-10-CM | POA: Diagnosis not present

## 2015-10-30 DIAGNOSIS — F17210 Nicotine dependence, cigarettes, uncomplicated: Secondary | ICD-10-CM | POA: Insufficient documentation

## 2015-10-30 DIAGNOSIS — Z7982 Long term (current) use of aspirin: Secondary | ICD-10-CM | POA: Insufficient documentation

## 2015-10-30 DIAGNOSIS — E1040 Type 1 diabetes mellitus with diabetic neuropathy, unspecified: Secondary | ICD-10-CM | POA: Insufficient documentation

## 2015-10-30 DIAGNOSIS — Z8249 Family history of ischemic heart disease and other diseases of the circulatory system: Secondary | ICD-10-CM | POA: Insufficient documentation

## 2015-10-30 DIAGNOSIS — G4709 Other insomnia: Secondary | ICD-10-CM | POA: Insufficient documentation

## 2015-10-30 DIAGNOSIS — R079 Chest pain, unspecified: Secondary | ICD-10-CM | POA: Diagnosis present

## 2015-10-30 DIAGNOSIS — I2582 Chronic total occlusion of coronary artery: Secondary | ICD-10-CM | POA: Insufficient documentation

## 2015-10-30 DIAGNOSIS — I2584 Coronary atherosclerosis due to calcified coronary lesion: Secondary | ICD-10-CM | POA: Insufficient documentation

## 2015-10-30 DIAGNOSIS — E10319 Type 1 diabetes mellitus with unspecified diabetic retinopathy without macular edema: Secondary | ICD-10-CM | POA: Diagnosis not present

## 2015-10-30 DIAGNOSIS — Z7902 Long term (current) use of antithrombotics/antiplatelets: Secondary | ICD-10-CM | POA: Diagnosis not present

## 2015-10-30 DIAGNOSIS — I25118 Atherosclerotic heart disease of native coronary artery with other forms of angina pectoris: Secondary | ICD-10-CM | POA: Diagnosis not present

## 2015-10-30 DIAGNOSIS — I10 Essential (primary) hypertension: Secondary | ICD-10-CM | POA: Diagnosis not present

## 2015-10-30 DIAGNOSIS — M069 Rheumatoid arthritis, unspecified: Secondary | ICD-10-CM | POA: Insufficient documentation

## 2015-10-30 HISTORY — PX: CARDIAC CATHETERIZATION: SHX172

## 2015-10-30 LAB — GLUCOSE, CAPILLARY
Glucose-Capillary: 125 mg/dL — ABNORMAL HIGH (ref 65–99)
Glucose-Capillary: 67 mg/dL (ref 65–99)
Glucose-Capillary: 57 mg/dL — ABNORMAL LOW (ref 65–99)
Glucose-Capillary: 194 mg/dL — ABNORMAL HIGH (ref 65–99)

## 2015-10-30 SURGERY — LEFT HEART CATH AND CORS/GRAFTS ANGIOGRAPHY

## 2015-10-30 MED ORDER — SODIUM CHLORIDE 0.9% FLUSH: 3.0000 mL | Freq: Two times a day (BID) | INTRAVENOUS | Status: DC

## 2015-10-30 MED ORDER — HEPARIN SODIUM (PORCINE) 1000 UNIT/ML IJ SOLN
INTRAMUSCULAR | Status: AC
  Filled 2015-10-30: qty 1

## 2015-10-30 MED ORDER — LIDOCAINE HCL (PF) 1 % IJ SOLN
INTRAMUSCULAR | Status: DC | PRN
  Administered 2015-10-30: 20 mL

## 2015-10-30 MED ORDER — SODIUM CHLORIDE 0.9 % IV SOLN: 250.0000 mL | INTRAVENOUS | Status: DC | PRN

## 2015-10-30 MED ORDER — HEPARIN (PORCINE) IN NACL 2-0.9 UNIT/ML-% IJ SOLN
INTRAMUSCULAR | Status: DC | PRN
  Administered 2015-10-30: 1000 mL

## 2015-10-30 MED ORDER — MIDAZOLAM HCL 2 MG/2ML IJ SOLN
INTRAMUSCULAR | Status: AC
  Filled 2015-10-30: qty 2

## 2015-10-30 MED ORDER — HYDROMORPHONE HCL 1 MG/ML IJ SOLN
INTRAMUSCULAR | Status: DC | PRN
  Administered 2015-10-30: 0.5 mg via INTRAVENOUS

## 2015-10-30 MED ORDER — SODIUM CHLORIDE 0.9 % WEIGHT BASED INFUSION: 1.0000 mL/kg/h | INTRAVENOUS | Status: DC

## 2015-10-30 MED ORDER — ASPIRIN 81 MG PO CHEW: 81.0000 mg | CHEWABLE_TABLET | ORAL | Status: DC

## 2015-10-30 MED ORDER — IOPAMIDOL (ISOVUE-370) INJECTION 76%
INTRAVENOUS | Status: DC | PRN
  Administered 2015-10-30: 80 mL via INTRAVENOUS

## 2015-10-30 MED ORDER — SODIUM CHLORIDE 0.9 % WEIGHT BASED INFUSION
3.0000 mL/kg/h | INTRAVENOUS | Status: AC
Start: 2015-10-30 — End: 2015-10-30
  Administered 2015-10-30: 3 mL/kg/h via INTRAVENOUS

## 2015-10-30 MED ORDER — SODIUM CHLORIDE 0.9% FLUSH: 3.0000 mL | INTRAVENOUS | Status: DC | PRN

## 2015-10-30 MED ORDER — LIDOCAINE HCL (PF) 1 % IJ SOLN
INTRAMUSCULAR | Status: AC
  Filled 2015-10-30: qty 30

## 2015-10-30 MED ORDER — HEPARIN (PORCINE) IN NACL 2-0.9 UNIT/ML-% IJ SOLN
INTRAMUSCULAR | Status: AC
  Filled 2015-10-30: qty 1000

## 2015-10-30 MED ORDER — NITROGLYCERIN 1 MG/10 ML FOR IR/CATH LAB
INTRA_ARTERIAL | Status: AC
  Filled 2015-10-30: qty 10

## 2015-10-30 MED ORDER — VERAPAMIL HCL 2.5 MG/ML IV SOLN
INTRAVENOUS | Status: AC
  Filled 2015-10-30: qty 2

## 2015-10-30 MED ORDER — SODIUM CHLORIDE 0.9 % WEIGHT BASED INFUSION: 1.0000 mL/kg/h | INTRAVENOUS | Status: AC

## 2015-10-30 MED ORDER — MIDAZOLAM HCL 2 MG/2ML IJ SOLN
INTRAMUSCULAR | Status: DC | PRN
  Administered 2015-10-30: 2 mg via INTRAVENOUS

## 2015-10-30 MED ORDER — HYDROMORPHONE HCL 1 MG/ML IJ SOLN
INTRAMUSCULAR | Status: AC
  Filled 2015-10-30: qty 1

## 2015-10-30 MED ORDER — IOPAMIDOL (ISOVUE-370) INJECTION 76%
INTRAVENOUS | Status: AC
  Filled 2015-10-30: qty 100

## 2015-10-30 SURGICAL SUPPLY — 9 items
CATH INFINITI 5FR MPB2 (CATHETERS) ×3 IMPLANT
KIT HEART LEFT (KITS) ×3 IMPLANT
PACK CARDIAC CATHETERIZATION (CUSTOM PROCEDURE TRAY) ×3 IMPLANT
SET INTRODUCER MICROPUNCT 5F (INTRODUCER) ×3 IMPLANT
SHEATH PINNACLE 5F 10CM (SHEATH) ×3 IMPLANT
TRANSDUCER W/STOPCOCK (MISCELLANEOUS) ×3 IMPLANT
TUBING CIL FLEX 10 FLL-RA (TUBING) ×3 IMPLANT
VALVE MANIFOLD 3 PORT W/RA/ON (MISCELLANEOUS) ×3 IMPLANT
WIRE EMERALD 3MM-J .035X150CM (WIRE) ×3 IMPLANT

## 2015-10-30 NOTE — Progress Notes (Signed)
Site area: rt groin  Site Prior to Removal:  Level  0 Pressure Applied For:  20 minutes Manual:   yes Patient Status During Pull:  stable Post Pull Site:  Level  0 Post Pull Instructions Given:  yes Post Pull Pulses Present: yes Dressing Applied:  tegaderm Bedrest begins @ 1345 Comments:

## 2015-10-30 NOTE — Discharge Instructions (Signed)
Angiogram, Care After °Refer to this sheet in the next few weeks. These instructions provide you with information about caring for yourself after your procedure. Your health care provider may also give you more specific instructions. Your treatment has been planned according to current medical practices, but problems sometimes occur. Call your health care provider if you have any problems or questions after your procedure. °WHAT TO EXPECT AFTER THE PROCEDURE °After your procedure, it is typical to have the following: °· Bruising at the catheter insertion site that usually fades within 1-2 weeks. °· Blood collecting in the tissue (hematoma) that may be painful to the touch. It should usually decrease in size and tenderness within 1-2 weeks. °HOME CARE INSTRUCTIONS °· Take medicines only as directed by your health care provider. °· You may shower 24-48 hours after the procedure or as directed by your health care provider. Remove the bandage (dressing) and gently wash the site with plain soap and water. Pat the area dry with a clean towel. Do not rub the site, because this may cause bleeding. °· Do not take baths, swim, or use a hot tub until your health care provider approves. °· Check your insertion site every day for redness, swelling, or drainage. °· Do not apply powder or lotion to the site. °· Do not lift over 10 lb (4.5 kg) for 5 days after your procedure or as directed by your health care provider. °· Ask your health care provider when it is okay to: °¨ Return to work or school. °¨ Resume usual physical activities or sports. °¨ Resume sexual activity. °· Do not drive home if you are discharged the same day as the procedure. Have someone else drive you. °· You may drive 24 hours after the procedure unless otherwise instructed by your health care provider. °· Do not operate machinery or power tools for 24 hours after the procedure or as directed by your health care provider. °· If your procedure was done as an  outpatient procedure, which means that you went home the same day as your procedure, a responsible adult should be with you for the first 24 hours after you arrive home. °· Keep all follow-up visits as directed by your health care provider. This is important. °SEEK MEDICAL CARE IF: °· You have a fever. °· You have chills. °· You have increased bleeding from the catheter insertion site. Hold pressure on the site. °SEEK IMMEDIATE MEDICAL CARE IF: °· You have unusual pain at the catheter insertion site. °· You have redness, warmth, or swelling at the catheter insertion site. °· You have drainage (other than a small amount of blood on the dressing) from the catheter insertion site. °· The catheter insertion site is bleeding, and the bleeding does not stop after 30 minutes of holding steady pressure on the site. °· The area near or just beyond the catheter insertion site becomes pale, cool, tingly, or numb. °  °This information is not intended to replace advice given to you by your health care provider. Make sure you discuss any questions you have with your health care provider. °  °Document Released: 02/06/2005 Document Revised: 08/11/2014 Document Reviewed: 12/22/2012 °Elsevier Interactive Patient Education ©2016 [employer] ° °

## 2015-10-30 NOTE — Interval H&P Note (Signed)
History and Physical Interval Note:  10/30/2015 12:30 PM  [name]  has presented today for surgery, with the diagnosis of abnormal nuc  The various methods of treatment have been discussed with the patient and family. After consideration of risks, benefits and other options for treatment, the patient has consented to  Procedure(s): Left Heart Cath and Cors/Grafts Angiography (N/A) possible PCI as a surgical intervention .  The patient's history has been reviewed, patient examined, no change in status, stable for surgery.  I have reviewed the patient's chart and labs.  Questions were answered to the patient's satisfaction.   Ischemic Symptoms? CCS II (Slight limitation of ordinary activity) Anti-ischemic Medical Therapy? Maximal Medical Therapy (2 or more classes of medications) Non-invasive Test Results? High-risk stress test findings: cardiac mortality >3%/yr Prior CABG? Previous CABG   Patient Information:   >=1 SVG stenosis  A (8)  Indication: 55; Score: 8   Patient Information:   All bypass grafts patent, >=1 lesion(s) in native coronaries without bypass grafts  A (8)  Indication: 55; Score: 8   Patient Information:   Native 3V-CAD, failure of multiple grafts, depressed LVEF, patent LIMA graft PCI  U (6)  Indication: 68; Score: 6   Patient Information:   Native 3V-CAD, failure of multiple grafts, depressed LVEF, patent LIMA graft CABG  A (7)  Indication: 68; Score: 7   Patient Information:   Native 3V-CAD, failure of multiple grafts, depressed LVEF, nonfunctional LIMA graft PCI  A (8)  Indication: 69; Score: 8   Patient Information:   Native 3V-CAD, failure of multiple grafts, depressed LVEF, nonfunctional LIMA graft CABG  U (6)  Indication: 69; Score: 6   [name]

## 2015-10-31 ENCOUNTER — Encounter (HOSPITAL_COMMUNITY): Payer: Self-pay | Admitting: Cardiology

## 2015-10-31 MED FILL — Nitroglycerin IV Soln 100 MCG/ML in D5W: INTRA_ARTERIAL | Qty: 10 | Status: AC

## 2015-10-31 MED FILL — Verapamil HCl IV Soln 2.5 MG/ML: INTRAVENOUS | Qty: 2 | Status: AC

## 2015-11-20 DIAGNOSIS — E113599 Type 2 diabetes mellitus with proliferative diabetic retinopathy without macular edema, unspecified eye: Secondary | ICD-10-CM | POA: Insufficient documentation

## 2015-12-06 ENCOUNTER — Ambulatory Visit (INDEPENDENT_AMBULATORY_CARE_PROVIDER_SITE_OTHER): Payer: 59 | Admitting: Cardiothoracic Surgery

## 2015-12-06 ENCOUNTER — Ambulatory Visit
Admission: RE | Admit: 2015-12-06 | Discharge: 2015-12-06 | Disposition: A | Discharge status: Home / Self Care | Payer: 59 | Source: Ambulatory Visit | Attending: Cardiothoracic Surgery | Admitting: Cardiothoracic Surgery

## 2015-12-06 ENCOUNTER — Other Ambulatory Visit: Payer: Self-pay | Admitting: Cardiothoracic Surgery

## 2015-12-06 ENCOUNTER — Ambulatory Visit: Admission: RE | Admit: 2015-12-06 | Payer: 59 | Source: Ambulatory Visit

## 2015-12-06 ENCOUNTER — Encounter: Payer: Self-pay | Admitting: Cardiothoracic Surgery

## 2015-12-06 VITALS — BP 103/60 | HR 70 | Resp 20 | Ht 68.0 in | Wt 190.0 lb

## 2015-12-06 DIAGNOSIS — I2510 Atherosclerotic heart disease of native coronary artery without angina pectoris: Secondary | ICD-10-CM

## 2015-12-06 DIAGNOSIS — R0782 Intercostal pain: Secondary | ICD-10-CM

## 2015-12-06 DIAGNOSIS — Z951 Presence of aortocoronary bypass graft: Secondary | ICD-10-CM | POA: Diagnosis not present

## 2015-12-06 NOTE — Progress Notes (Signed)
[address]       [place] [address]             [phone]      [name] [facility] Medical Record #[id number] Date of Birth: 07-24-1966  Referring: Yates Decamp, MD Primary Care: Garlan Fillers, MD  Chief Complaint:   POST OP FOLLOW UP 04/27/2014  OPERATIVE REPORT PREOPERATIVE DIAGNOSIS: Coronary occlusive disease with unstable angina. POSTOPERATIVE DIAGNOSIS: Coronary occlusive disease with unstable angina. SURGICAL PROCEDURE: Coronary artery bypass grafting x5 with the left internal mammary to the left anterior descending coronary artery, reverse saphenous vein graft to the diagonal coronary artery, sequential reverse saphenous vein graft to the first obtuse marginal and distal circumflex, reverse saphenous vein graft to the mid posterior descending coronary artery with right thigh and calf endo vein harvesting. SURGEON: Sheliah Plane, MD  History of Present Illness:     Preoperatively he is known to have rheumatoid arthritis and significant diabetes. He was also noted preoperatively to have bilateral carotid stenosis 40-59%, asymptomatic. Patient comes to the office today at the request of Dr. Adelfa Koh because he had noted some slight movement in the lower left side of his sternal incision. He recently had a positive stress test and underwent repeat cardiac catheterization by Dr. Jacinto Halim. The distal extent of sequential circumflex graft had occluded otherwise his grafts were patent. Continued medical therapy was recommended Patient denies any recurrent angina.   Past Medical History  Diagnosis Date  . Coronary artery disease   . Anginal pain (HCC)   . Shortness of breath   . Asthma   . Diabetes mellitus without complication (HCC)     type 1  . Arthritis     RA IN HANDS  . Unstable angina pectoris (HCC) 04/25/2014  . 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/03/2014  . Diabetes (HCC) 05/03/2014  . CAD (coronary  artery disease) 04/27/2014  . Chronic renal disease, stage 3, moderately decreased glomerular filtration rate between 30-59 mL/min/1.73 square meter 05/03/2014     History  Smoking status  . Former Smoker -- 2.00 packs/day for 34 years  . Types: Cigarettes  . Quit date: 04/24/2014  Smokeless tobacco  . Never Used    Comment: currently using E cigarette    History  Alcohol Use  . 0.0 oz/week  . 0 Standard drinks or equivalent per week    Comment: RARE     Allergies  Allergen Reactions  . Morphine And Related Nausea And Vomiting    Current Outpatient Prescriptions  Medication Sig Dispense Refill  . albuterol (PROVENTIL HFA;VENTOLIN HFA) 108 (90 BASE) MCG/ACT inhaler Inhale 1-2 puffs into the lungs every 4 (four) hours as needed for wheezing or shortness of breath.    Marland Kitchen amLODipine (NORVASC) 5 MG tablet Take 5 mg by mouth daily.     Marland Kitchen aspirin 81 MG tablet Take 81 mg by mouth daily.    Marland Kitchen buPROPion (WELLBUTRIN SR) 150 MG 12 hr tablet Take 75 mg by mouth daily.     . clopidogrel (PLAVIX) 75 MG tablet Take 75 mg by mouth daily with breakfast.     . docusate sodium (COLACE) 250 MG capsule Take 500 mg by mouth daily.    Marland Kitchen etanercept (ENBREL SURECLICK) 50 MG/ML injection Inject 50 mg into the skin once a week. Thursdays    . insulin glargine (LANTUS) 100 UNIT/ML injection Inject 37 Units into the skin at bedtime.     . insulin  lispro (HUMALOG) 100 UNIT/ML injection Inject 4-17 Units into the skin See admin instructions. Inject 3-4 times daily with meals per sliding scale instructions on glucose meter: 1 unit for every 10 grams of carbs and CBG calculations    . losartan (COZAAR) 25 MG tablet Take 1 tablet (25 mg total) by mouth daily. 30 tablet 1  . metoprolol succinate (TOPROL-XL) 50 MG 24 hr tablet Take 50 mg by mouth 2 (two) times daily.  0  . nitroGLYCERIN (NITROSTAT) 0.4 MG SL tablet Place 0.4 mg under the tongue every 5 (five) minutes as needed for chest pain.   0  . Probiotic  Product (PROBIOTIC PO) Take 1 tablet by mouth daily.    . tadalafil (CIALIS) 20 MG tablet Take 20 mg by mouth daily as needed for erectile dysfunction.    . valsartan (DIOVAN) 40 MG tablet Take 40 mg by mouth daily.      No current facility-administered medications for this visit.       Physical Exam: BP 103/60 mmHg  Pulse 70  Resp 20  Ht 5\' 8"  (1.727 m)  Wt 190 lb (86.183 kg)  BMI 28.90 kg/m2  SpO2 98%  General appearance: alert and cooperative Neurologic: intact Heart: regular rate and rhythm, S1, S2 normal, no murmur, click, rub or gallop Lungs: clear to auscultation bilaterally Abdomen: soft, non-tender; bowel sounds normal; no masses,  no organomegaly Extremities: extremities normal, atraumatic, no cyanosis or edema and Homans sign is negative, no sign of DVT Wound: sternum is stable and well healed, At the left lower sternal incision there is a very slight click over the xiphoid.  Diagnostic Studies & Laboratory data:     Recent Radiology Findings:   Dg Chest 2 View  12/06/2015  CLINICAL DATA:  Chest pain, it CABG in September of 2015 EXAM: CHEST  2 VIEW COMPARISON:  Chest x-ray of 10/09/2015 FINDINGS: No active infiltrate or effusion is seen. Mediastinal and hilar contours are unremarkable. The heart is within normal limits in size. Median sternotomy sutures are noted from prior CABG. No bony abnormality is seen. IMPRESSION: No active cardiopulmonary disease. Electronically Signed   By: 12/09/2015 M.D.   On: 12/06/2015 12:17      Recent Lab Findings: Lab Results  Component Value Date   WBC 9.8 06/01/2014   HGB 11.3* 06/01/2014   HCT 34.7* 06/01/2014   PLT 383.0 06/01/2014   GLUCOSE 54* 06/01/2014   ALT 34 04/25/2014   AST 34 04/25/2014   NA 137 06/01/2014   K 4.3 06/01/2014   CL 103 06/01/2014   CREATININE 1.3 06/01/2014   BUN 15 06/01/2014   CO2 26 06/01/2014   TSH 2.00 06/01/2014   INR 1.35 04/27/2014   HGBA1C 7.4* 04/25/2014      Assessment / Plan:     Patient doing recently well following coronary artery bypass grafting in September 2015, Has a very slight click of the xiphoid process probably related to his sternal incision but without significant discomfort or sternal instability We'll see the patient back as needed    10-11-1983 MD      [address] [place] [place] Office [phone]   Beeper [phone]  12/06/2015 12:49 PM

## 2016-01-02 DIAGNOSIS — Z736 Limitation of activities due to disability: Secondary | ICD-10-CM

## 2016-06-02 ENCOUNTER — Telehealth: Payer: Self-pay | Admitting: Radiology

## 2016-06-02 MED ORDER — ETANERCEPT 50 MG/ML ~~LOC~~ SOAJ: 50.0000 mg | SUBCUTANEOUS | 2 refills | Status: DC

## 2016-06-02 NOTE — Telephone Encounter (Signed)
Last visit 03/25/16 Next visit not sch. Message sent for appt sch Labs WNL 03/25/16 12/03/15 neg TB gold  Ok to refill Enbrel per SD

## 2016-06-02 NOTE — Telephone Encounter (Signed)
Patient needs follow up appt with Dr Deveshwar pls call to make appt. Jan/ Feb 

## 2016-06-02 NOTE — Telephone Encounter (Signed)
Refill request received via fax for Enbrel

## 2016-06-05 ENCOUNTER — Telehealth: Payer: Self-pay | Admitting: Radiology

## 2016-06-05 MED ORDER — ETANERCEPT 50 MG/ML ~~LOC~~ SOAJ: 50.0000 mg | SUBCUTANEOUS | 2 refills | Status: DC

## 2016-06-05 NOTE — Telephone Encounter (Signed)
This was sent in on 06/02/16 resent

## 2016-06-05 NOTE — Telephone Encounter (Signed)
Refill request received via fax for Enbrel / [facility]  

## 2016-08-11 DIAGNOSIS — M542 Cervicalgia: Secondary | ICD-10-CM | POA: Insufficient documentation

## 2016-09-15 ENCOUNTER — Ambulatory Visit: Payer: Self-pay | Admitting: Rheumatology

## 2016-09-16 ENCOUNTER — Other Ambulatory Visit: Payer: Self-pay | Admitting: *Deleted

## 2016-09-16 MED ORDER — ETANERCEPT 50 MG/ML ~~LOC~~ SOAJ: 50.0000 mg | SUBCUTANEOUS | 2 refills | Status: DC

## 2016-09-16 NOTE — Telephone Encounter (Signed)
Refill request received via fax for Enbrel   Last Visit: 03/25/16 Next Visit: 10/01/16 Labs: 03/26/17 WBC: 12.4 Patient will update labs tomorrow TB Gold: 12/03/15 Neg  Okay to refill Enbrel?

## 2016-09-17 ENCOUNTER — Other Ambulatory Visit: Payer: Self-pay | Admitting: *Deleted

## 2016-09-17 DIAGNOSIS — Z79899 Other long term (current) drug therapy: Secondary | ICD-10-CM

## 2016-09-17 LAB — COMPLETE METABOLIC PANEL WITH GFR
ALT: 24 U/L (ref 9–46)
AST: 20 U/L (ref 10–35)
Albumin: 3.9 g/dL (ref 3.6–5.1)
Alkaline Phosphatase: 85 U/L (ref 40–115)
BUN: 12 mg/dL (ref 7–25)
CO2: 27 mmol/L (ref 20–31)
Calcium: 9.1 mg/dL (ref 8.6–10.3)
Chloride: 104 mmol/L (ref 98–110)
Creat: 1.34 mg/dL — ABNORMAL HIGH (ref 0.70–1.33)
GFR, Est African American: 70 mL/min (ref >=60)
GFR, Est Non African American: 60 mL/min (ref >=60)
Glucose, Bld: 128 mg/dL — ABNORMAL HIGH (ref 65–99)
Potassium: 4.3 mmol/L (ref 3.5–5.3)
Sodium: 139 mmol/L (ref 135–146)
Total Bilirubin: 0.4 mg/dL (ref 0.2–1.2)
Total Protein: 6.6 g/dL (ref 6.1–8.1)

## 2016-09-17 LAB — CBC WITH DIFFERENTIAL/PLATELET
Basophils Absolute: 102 cells/uL (ref 0–200)
Basophils Relative: 1 %
Eosinophils Absolute: 816 cells/uL — ABNORMAL HIGH (ref 15–500)
Eosinophils Relative: 8 %
HCT: 40.1 % (ref 38.5–50.0)
Hemoglobin: 12.9 g/dL — ABNORMAL LOW (ref 13.2–17.1)
Lymphocytes Relative: 34 %
Lymphs Abs: 3468 cells/uL (ref 850–3900)
MCH: 30.1 pg (ref 27.0–33.0)
MCHC: 32.2 g/dL (ref 32.0–36.0)
MCV: 93.7 fL (ref 80.0–100.0)
MPV: 10.1 fL (ref 7.5–12.5)
Monocytes Absolute: 612 cells/uL (ref 200–950)
Monocytes Relative: 6 %
Neutro Abs: 5202 cells/uL (ref 1500–7800)
Neutrophils Relative %: 51 %
Platelets: 237 10*3/uL (ref 140–400)
RBC: 4.28 MIL/uL (ref 4.20–5.80)
RDW: 13.7 % (ref 11.0–15.0)
WBC: 10.2 10*3/uL (ref 3.8–10.8)

## 2016-09-23 DIAGNOSIS — Z720 Tobacco use: Secondary | ICD-10-CM | POA: Insufficient documentation

## 2016-09-23 DIAGNOSIS — Z8639 Personal history of other endocrine, nutritional and metabolic disease: Secondary | ICD-10-CM | POA: Insufficient documentation

## 2016-09-23 DIAGNOSIS — Z79899 Other long term (current) drug therapy: Secondary | ICD-10-CM | POA: Insufficient documentation

## 2016-09-23 DIAGNOSIS — Z8679 Personal history of other diseases of the circulatory system: Secondary | ICD-10-CM | POA: Insufficient documentation

## 2016-09-23 DIAGNOSIS — M170 Bilateral primary osteoarthritis of knee: Secondary | ICD-10-CM | POA: Insufficient documentation

## 2016-09-23 DIAGNOSIS — Z87448 Personal history of other diseases of urinary system: Secondary | ICD-10-CM | POA: Insufficient documentation

## 2016-09-23 DIAGNOSIS — M0630 Rheumatoid nodule, unspecified site: Secondary | ICD-10-CM | POA: Insufficient documentation

## 2016-09-23 NOTE — Progress Notes (Deleted)
Office Visit Note  Patient: [name]             Date of Birth: October 15, 1965           MRN: [id number]             PCP: Garlan Fillers, MD Referring: [name], MD Visit Date: 10/01/2016 Occupation: @GUAROCC @    Subjective:  No chief complaint on file.   History of Present Illness: [name] is a 51 y.o. male ***   Activities of Daily Living:  Patient reports morning stiffness for *** {minute/hour:19697}.   Patient {ACTIONS;DENIES/REPORTS:21021675::"Denies"} nocturnal pain.  Difficulty dressing/grooming: {ACTIONS;DENIES/REPORTS:21021675::"Denies"} Difficulty climbing stairs: {ACTIONS;DENIES/REPORTS:21021675::"Denies"} Difficulty getting out of chair: {ACTIONS;DENIES/REPORTS:21021675::"Denies"} Difficulty using hands for taps, buttons, cutlery, and/or writing: {ACTIONS;DENIES/REPORTS:21021675::"Denies"}   No Rheumatology ROS completed.   PMFS History:  Patient Active Problem List   Diagnosis Date Noted  . High risk medication use 09/23/2016  . History of hypertension 09/23/2016  . History of chronic kidney disease 09/23/2016  . History of coronary artery disease 09/23/2016  . Tobacco abuse 09/23/2016  . Primary osteoarthritis of both knees 09/23/2016  . History of diabetes mellitus 09/23/2016  . Chest pain with high risk for cardiac etiology 10/29/2015  . Asymptomatic bilateral carotid artery stenosis 06/08/2014  . Pleural effusion, bilateral 06/03/2014  . Dyspnea 06/01/2014  . Diabetes (HCC) 05/03/2014  . 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/03/2014  . 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 Chronic renal disease, stage 3, moderately decreased glomerular filtration rate between 30-59 mL/min/1.73 square meter 05/03/2014  . CAD (coronary artery disease) 04/27/2014    Past Medical History:  Diagnosis Date  . Anginal pain (HCC)   . Arthritis    RA IN HANDS  . Asthma   . CAD (coronary artery disease) 04/27/2014  . Chronic renal disease,  stage 3, moderately decreased glomerular filtration rate between 30-59 mL/min/1.73 square meter 05/03/2014  . Coronary artery disease   . Diabetes (HCC) 05/03/2014  . Diabetes mellitus without complication (HCC)    type 1  . 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/03/2014  . Shortness of breath   . Unstable angina pectoris (HCC) 04/25/2014    Family History  Problem Relation Age of Onset  . Prostate cancer Father   . Stomach cancer Mother   . Heart disease Father   . Heart disease Brother    Past Surgical History:  Procedure Laterality Date  . CARDIAC CATHETERIZATION  04/25/2014   BY DR 04/27/2014  . CARDIAC CATHETERIZATION N/A 10/30/2015   Procedure: Left Heart Cath and Cors/Grafts Angiography;  Surgeon: 11/01/2015, MD;  Location[facility] INVASIVE CV LAB;  Service: Cardiovascular;  Laterality: N/A;  . CARPAL TUNNEL RELEASE    . CORONARY ARTERY BYPASS GRAFT N/A 04/27/2014   Procedure: CORONARY ARTERY BYPASS GRAFTING on pump using left internal mammary artery to LAD coronary artery, right great saphenous vein graft to diagonal coronary artery with sequential to OM1 and circumflex coronary arteries. Right greater saphenous vein graft to posterior descending coronary artery. ;  Surgeon: 04/29/2014, MD;  Location: [facility] OR;  Service: Open Heart Surgery;  Laterality: N  . ENDOVEIN HARVEST OF GREATER SAPHENOUS VEIN Right 04/27/2014   Procedure: ENDOVEIN HARVEST OF GREATER SAPHENOUS VEIN;  Surgeon: 04/29/2014, MD;  Location: [facility] OR;  Service: Open Heart Surgery;  Laterality: Right;  . EYE SURGERY     LASER  . INTRAOPERATIVE TRANSESOPHAGEAL ECHOCARDIOGRAM N/A 04/27/2014   Procedure: INTRAOPERATIVE TRANSESOPHAGEAL ECHOCARDIOGRAM;  Surgeon: Delight Ovens, MD;  Location: [facility] OR;  Service: Open Heart Surgery;  Laterality: N/A;  . LEFT HEART CATHETERIZATION WITH CORONARY ANGIOGRAM N/A 04/25/2014   Procedure: LEFT HEART CATHETERIZATION WITH CORONARY ANGIOGRAM;  Surgeon: Pamella Pert,  MD;  Location[facility] C[facility] LAB;  Service: Cardiovascular;  Laterality: N/A;   Social History   Social History Narrative  . No narrative on file     Objective: Vital Signs: There were no vitals taken for this visit.   Physical Exam   Musculoskeletal Exam: ***  CDAI Exam: No CDAI exam completed.    Investigation: Findings:  11/28/2015 TB Gold is negative.   05/28/2015 X-ray of bilateral hands 2 views today showed left second PIP questionable erosive change or cystic change, some erosive change in the carpal bones, more prominent in the right wrist than the left, although there was no progression noted compared to his previous x-rays in 2012. Bilateral feet x-rays showed PIP and DIP narrowing, no MTP changes, no erosive changes were noted.  lab work from 01/28/2008 that hepatitis negative, UA normal, comprehensive normal, CBC with dif normal except for red blood cells 4.17.  Hemoglobin 12, hematocrit 38.4 and platelet 460.      Imaging: No results found.  Speciality Comments: No specialty comments available.    Procedures:  No procedures performed Allergies: Morphine and related   Assessment / Plan:     Visit Diagnoses: Rheumatoid arthritis involving multiple joints (HCC)  High risk medication use - Enbrel  History of hypertension  History of chronic kidney disease  History of coronary artery disease  Tobacco abuse  Primary osteoarthritis of both knees  History of diabetes mellitus    Orders: No orders of the defined types were placed in this encounter.  No orders of the defined types were placed in this encounter.   Face-to-face time spent with patient was *** minutes. 50% of time was spent in counseling and coordination of care.  Follow-Up Instructions: No Follow-up on file.   Herndon Grill, RT  Note - This record has been created using [employer].  Chart creation errors have been sought, but may not always  have been located. Such creation  errors do not reflect on  the standard of medical care.

## 2016-10-01 ENCOUNTER — Ambulatory Visit: Payer: Self-pay | Admitting: Rheumatology

## 2016-10-22 NOTE — Progress Notes (Signed)
Office Visit Note  Patient: [name]             Date of Birth: 12-30-1965           MRN: [id number]             PCP: Donnajean Lopes, MD Referring: Leanna Battles, MD Visit Date: 10/28/2016 Occupation: @GUAROCC @    Subjective:  Feet pain  History of Present Illness: [name] is a 51 y.o. male with history of rheumatoid arthritis and osteoarthritis. He believes is doing fairly well on Enbrel subcutaneous. He has some joint stiffness but not much joint swelling. He continues to have some discomfort in his feet which she relates to wearing heavy boots. He does have some discomfort in his shoulders at night if he sleeps on the side.  Activities of Daily Living:  Patient reports morning stiffness for 10 minutes.   Patient Reports nocturnal pain.  Difficulty dressing/grooming: Denies Difficulty climbing stairs: Denies Difficulty getting out of chair: Denies Difficulty using hands for taps, buttons, cutlery, and/or writing: Denies   Review of Systems  Constitutional: Positive for fatigue. Negative for night sweats and weakness ( ).  HENT: Negative for mouth sores, mouth dryness and nose dryness.   Eyes: Negative for redness and dryness.  Respiratory: Negative for shortness of breath and difficulty breathing.   Cardiovascular: Negative for chest pain, palpitations, hypertension, irregular heartbeat and swelling in legs/feet.  Gastrointestinal: Negative for constipation and diarrhea.  Endocrine: Negative for increased urination.  Musculoskeletal: Positive for arthralgias, joint pain and morning stiffness. Negative for joint swelling, myalgias, muscle weakness, muscle tenderness and myalgias.  Skin: Negative for color change, rash, hair loss, nodules/bumps, skin tightness, ulcers and sensitivity to sunlight.  Allergic/Immunologic: Negative for susceptible to infections.  Neurological: Negative for dizziness, fainting, memory loss and night sweats.  Hematological:  Negative for swollen glands.  Psychiatric/Behavioral: Negative for depressed mood and sleep disturbance. The patient is not nervous/anxious.     PMFS History:  Patient Active Problem List   Diagnosis Date Noted  . High risk medication use 09/23/2016  . History of hypertension 09/23/2016  . History of chronic kidney disease 09/23/2016  . History of coronary artery disease 09/23/2016  . Tobacco abuse 09/23/2016  . Primary osteoarthritis of both knees 09/23/2016  . History of diabetes mellitus 09/23/2016  . 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 Chest pain with high risk for cardiac etiology 10/29/2015  . Asymptomatic bilateral carotid artery stenosis 06/08/2014  . Pleural effusion, bilateral 06/03/2014  . Dyspnea 06/01/2014  . Diabetes ([place]) 05/03/2014  . Rheumatoid arthritis involving multiple joints ([place]) 05/03/2014  . 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 Chronic renal disease, stage 3, moderately decreased glomerular filtration rate between 30-59 mL/min/1.73 square meter 05/03/2014  . CAD (coronary artery disease) 04/27/2014    Past Medical History:  Diagnosis Date  . Anginal pain ([place])   . Arthritis    RA IN HANDS  . Asthma   . CAD (coronary artery disease) 04/27/2014  . Chronic renal disease, stage 3, moderately decreased glomerular filtration rate between 30-59 mL/min/1.73 square meter 05/03/2014  . Coronary artery disease   . Diabetes ([place]) 05/03/2014  . Diabetes mellitus without complication ([place])    type 1  . Rheumatoid arthritis involving multiple joints ([place]) 05/03/2014  . Shortness of breath   . Unstable angina pectoris ([place]) 04/25/2014    Family History  Problem Relation Age of Onset  . Prostate cancer Father   . Heart disease  Father   . Stomach cancer Mother   . Heart disease Brother    Past Surgical History:  Procedure Laterality Date  . CARDIAC CATHETERIZATION  04/25/2014   BY DR Einar Gip  . CARDIAC CATHETERIZATION N/A 10/30/2015   Procedure: Left Heart Cath and  Cors/Grafts Angiography;  Surgeon: Adrian Prows, MD;  Location: [facility];  Service: Cardiovascular;  Laterality: N/A;  . CARPAL TUNNEL RELEASE    . CORONARY ARTERY BYPASS GRAFT N/A 04/27/2014   Procedure: CORONARY ARTERY BYPASS GRAFTING on pump using left internal mammary artery to LAD coronary artery, right great saphenous vein graft to diagonal coronary artery with sequential to OM1 and circumflex coronary arteries. Right greater saphenous vein graft to posterior descending coronary artery. ;  Surgeon: Grace Isaac, MD;  Location: [facility];  Service: Open Heart Surgery;  Laterality: N  . ENDOVEIN HARVEST OF GREATER SAPHENOUS VEIN Right 04/27/2014   Procedure: ENDOVEIN HARVEST OF GREATER SAPHENOUS VEIN;  Surgeon: Grace Isaac, MD;  Location: [facility];  Service: Open Heart Surgery;  Laterality: Right;  . EYE SURGERY     LASER  . INTRAOPERATIVE TRANSESOPHAGEAL ECHOCARDIOGRAM N/A 04/27/2014   Procedure: INTRAOPERATIVE TRANSESOPHAGEAL ECHOCARDIOGRAM;  Surgeon: Grace Isaac, MD;  Location: [facility];  Service: Open Heart Surgery;  Laterality: N/A;  . LEFT HEART CATHETERIZATION WITH CORONARY ANGIOGRAM N/A 04/25/2014   Procedure: LEFT HEART CATHETERIZATION WITH CORONARY ANGIOGRAM;  Surgeon: Laverda Page, MD;  [facility];  Service: Cardiovascular;  Laterality: N/A;   Social History   Social History Narrative  . No narrative on file     Objective: Vital Signs: BP 130/74   Pulse 78   Resp 16   Ht 5' 8"  (1.727 m)   Wt 180 lb (81.6 kg)   BMI 27.37 kg/m    Physical Exam  Constitutional: He is oriented to person, place, and time. He appears well-developed and well-nourished.  HENT:  Head: Normocephalic and atraumatic.  Eyes: Conjunctivae and EOM are normal. Pupils are equal, round, and reactive to light.  Neck: Normal range of motion. Neck supple.  Cardiovascular: Normal rate, regular rhythm and normal heart sounds.   Pulmonary/Chest: Effort normal and breath sounds  normal.  Abdominal: Soft. Bowel sounds are normal.  Neurological: He is alert and oriented to person, place, and time.  Skin: Skin is warm and dry. Capillary refill takes less than 2 seconds.  Psychiatric: He has a normal mood and affect. His behavior is normal.  Nursing note and vitals reviewed.    Musculoskeletal Exam: C-spine limited lateral rotation. Thoracic and lumbar spine fairly good range of motion he has thoracic kyphosis. Shoulder joints good range of motion with some discomfort in his symptoms of right subacromial bursitis. Range of motion. He has limited range of motion of bilateral wrist joints. He has thickening of bilateral MCPs and PIPs with no synovitis. Hip joints knee joints ankles MTPs PIPs with good range of motion with no synovitis.  CDAI Exam: CDAI Homunculus Exam:   Joint Counts:  CDAI Tender Joint count: 0 CDAI Swollen Joint count: 0  Global Assessments:  Patient Global Assessment: 5 Provider Global Assessment: 2  CDAI Calculated Score: 7    Investigation: Findings:  11/28/2015 TB Gold is negative.   05/28/2015 X-ray of bilateral hands 2 views today showed left second PIP questionable erosive change or cystic change, some erosive change in the carpal bones, more prominent in the right wrist than the left, although there was no progression noted  compared to his previous x-rays in 2012. Bilateral feet x-rays showed PIP and DIP narrowing, no MTP changes, no erosive changes were noted.  lab work from 01/28/2008 that hepatitis negative, UA normal, comprehensive normal, CBC with dif normal except for red blood cells 4.17.  Hemoglobin 12, hematocrit 38.4 and platelet 460.    Orders Only on 09/17/2016  Component Date Value Ref Range Status  . WBC 09/17/2016 10.2  3.8 - 10.8 K/uL Final  . RBC 09/17/2016 4.28  4.20 - 5.80 MIL/uL Final  . Hemoglobin 09/17/2016 12.9* 13.2 - 17.1 g/dL Final  . HCT 09/17/2016 40.1  38.5 - 50.0 % Final  . MCV 09/17/2016 93.7  80.0  - 100.0 fL Final  . MCH 09/17/2016 30.1  27.0 - 33.0 pg Final  . MCHC 09/17/2016 32.2  32.0 - 36.0 g/dL Final  . RDW 09/17/2016 13.7  11.0 - 15.0 % Final  . Platelets 09/17/2016 237  140 - 400 K/uL Final  . MPV 09/17/2016 10.1  7.5 - 12.5 fL Final  . Neutro Abs 09/17/2016 5202  1,500 - 7,800 cells/uL Final  . Lymphs Abs 09/17/2016 3468  850 - 3,900 cells/uL Final  . Monocytes Absolute 09/17/2016 612  200 - 950 cells/uL Final  . Eosinophils Absolute 09/17/2016 816* 15 - 500 cells/uL Final  . Basophils Absolute 09/17/2016 102  0 - 200 cells/uL Final  . Neutrophils Relative % 09/17/2016 51  % Final  . Lymphocytes Relative 09/17/2016 34  % Final  . Monocytes Relative 09/17/2016 6  % Final  . Eosinophils Relative 09/17/2016 8  % Final  . Basophils Relative 09/17/2016 1  % Final  . Smear Review 09/17/2016 Criteria for review not met   Final  . Sodium 09/17/2016 139  135 - 146 mmol/L Final  . Potassium 09/17/2016 4.3  3.5 - 5.3 mmol/L Final  . Chloride 09/17/2016 104  98 - 110 mmol/L Final  . CO2 09/17/2016 27  20 - 31 mmol/L Final  . Glucose, Bld 09/17/2016 128* 65 - 99 mg/dL Final  . BUN 09/17/2016 12  7 - 25 mg/dL Final  . Creat 09/17/2016 1.34* 0.70 - 1.33 mg/dL Final   Comment:   For patients > or = 53 years of age: The upper reference limit for Creatinine is approximately 13% higher for people identified as African-American.     . Total Bilirubin 09/17/2016 0.4  0.2 - 1.2 mg/dL Final  . Alkaline Phosphatase 09/17/2016 85  40 - 115 U/L Final  . AST 09/17/2016 20  10 - 35 U/L Final  . ALT 09/17/2016 24  9 - 46 U/L Final  . Total Protein 09/17/2016 6.6  6.1 - 8.1 g/dL Final  . Albumin 09/17/2016 3.9  3.6 - 5.1 g/dL Final  . Calcium 09/17/2016 9.1  8.6 - 10.3 mg/dL Final  . GFR, Est African American 09/17/2016 70  >=60 mL/min Final  . GFR, Est Non African American 09/17/2016 60  >=60 mL/min Final     Imaging: No results found.  Speciality Comments: No specialty comments  available.    Procedures:  No procedures performed Allergies: Morphine and related   Assessment / Plan:     Visit Diagnoses: Rheumatoid arthritis involving multiple joints ([place]): He had no synovitis on examination today he does have synovial thickening and limited range of motion in his wrist joints.  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 His labs have been stable we'll continue to monitor them every 3 months.  Right subacromial bursitis: He is having some nocturnal pain. We will avoid cortisone injection with history of diabetes and hypertension. I've given him a handout on shoulder joint exercises.  Primary osteoarthritis of both knees: Muscle strengthening exercises were discussed.  Tobacco abuse: Smoking cessation was discussed.  His other medical problems are listed as follows:  History of coronary artery disease  History of diabetes mellitus  History of hypertension  S/P CABG x 5  Dyslipidemia    Orders: Orders Placed This Encounter  Procedures  . CBC with Differential/Platelet  . COMPLETE METABOLIC PANEL WITH GFR  . Quantiferon tb gold assay (blood)   No orders of the defined types were placed in this encounter.   Face-to-face time spent with patient was 30 minutes. 50% of time was spent in counseling and coordination of care.  Follow-Up Instructions: Return in about 5 months (around 03/30/2017) for Rheumatoid arthritis.   Bo Merino, MD  Note - This record has been created using Editor, commissioning.  Chart creation errors have been sought, but may not always  have been located. Such creation errors do not reflect on  the standard of medical care.

## 2016-10-28 ENCOUNTER — Encounter: Payer: Self-pay | Admitting: Rheumatology

## 2016-10-28 ENCOUNTER — Ambulatory Visit (INDEPENDENT_AMBULATORY_CARE_PROVIDER_SITE_OTHER): Payer: 59 | Admitting: Rheumatology

## 2016-10-28 VITALS — BP 130/74 | HR 78 | Resp 16 | Ht 68.0 in | Wt 180.0 lb

## 2016-10-28 DIAGNOSIS — M069 Rheumatoid arthritis, unspecified: Secondary | ICD-10-CM

## 2016-10-28 DIAGNOSIS — Z720 Tobacco use: Secondary | ICD-10-CM | POA: Diagnosis not present

## 2016-10-28 DIAGNOSIS — Z8639 Personal history of other endocrine, nutritional and metabolic disease: Secondary | ICD-10-CM

## 2016-10-28 DIAGNOSIS — Z79899 Other long term (current) drug therapy: Secondary | ICD-10-CM | POA: Diagnosis not present

## 2016-10-28 DIAGNOSIS — E785 Hyperlipidemia, unspecified: Secondary | ICD-10-CM | POA: Diagnosis not present

## 2016-10-28 DIAGNOSIS — Z8679 Personal history of other diseases of the circulatory system: Secondary | ICD-10-CM

## 2016-10-28 DIAGNOSIS — Z951 Presence of aortocoronary bypass graft: Secondary | ICD-10-CM

## 2016-10-28 DIAGNOSIS — M25511 Pain in right shoulder: Secondary | ICD-10-CM

## 2016-10-28 DIAGNOSIS — M0630 Rheumatoid nodule, unspecified site: Secondary | ICD-10-CM

## 2016-10-28 DIAGNOSIS — M170 Bilateral primary osteoarthritis of knee: Secondary | ICD-10-CM | POA: Diagnosis not present

## 2016-10-28 NOTE — Patient Instructions (Addendum)
Standing Labs We placed an order today for your standing lab work.    Please come back and get your standing labs in May 2018 and every 3 months.  You will be due for your TB Gold with your next labs.  We have open lab Monday through Friday from 8:30-11:30 AM and 1:30-4 PM at the office of Dr. Arbutus Ped, PA.   The office is located at [address], [address], [place], [place] [address] No appointment is necessary.   Labs are drawn by [facility].  You may receive a bill from [facility] for your lab work.     Shoulder Exercises Ask your health care provider which exercises are safe for you. Do exercises exactly as told by your health care provider and adjust them as directed. It is normal to feel mild stretching, pulling, tightness, or discomfort as you do these exercises, but you should stop right away if you feel sudden pain or your pain gets worse.Do not begin these exercises until told by your health care provider. RANGE OF MOTION EXERCISES  These exercises warm up your muscles and joints and improve the movement and flexibility of your shoulder. These exercises also help to relieve pain, numbness, and tingling. These exercises involve stretching your injured shoulder directly. Exercise A: Pendulum   1. Stand near a wall or a surface that you can hold onto for balance. 2. Bend at the waist and let your left / right arm hang straight down. Use your other arm to support you. Keep your back straight and do not lock your knees. 3. Relax your left / right arm and shoulder muscles, and move your hips and your trunk so your left / right arm swings freely. Your arm should swing because of the motion of your body, not because you are using your arm or shoulder muscles. 4. Keep moving your body so your arm swings in the following directions, as told by your health care provider:  Side to side.  Forward and backward.  In clockwise and counterclockwise circles. 5. Continue each  motion for __________ seconds, or for as long as told by your health care provider. 6. Slowly return to the starting position. Repeat __________ times. Complete this exercise __________ times a day. Exercise B:Flexion, Standing   1. Stand and hold a broomstick, a cane, or a similar object. Place your hands a little more than shoulder-width apart on the object. Your left / right hand should be palm-up, and your other hand should be palm-down. 2. Keep your elbow straight and keep your shoulder muscles relaxed. Push the stick down with your healthy arm to raise your left / right arm in front of your body, and then over your head until you feel a stretch in your shoulder.  Avoid shrugging your shoulder while you raise your arm. Keep your shoulder blade tucked down toward the middle of your back. 3. Hold for __________ seconds. 4. Slowly return to the starting position. Repeat __________ times. Complete this exercise __________ times a day. Exercise C: Abduction, Standing  1. Stand and hold a broomstick, a cane, or a similar object. Place your hands a little more than shoulder-width apart on the object. Your left / right hand should be palm-up, and your other hand should be palm-down. 2. While keeping your elbow straight and your shoulder muscles relaxed, push the stick across your body toward your left / right side. Raise your left / right arm to the side of your body and then  over your head until you feel a stretch in your shoulder.  Do not raise your arm above shoulder height, unless your health care provider tells you to do that.  Avoid shrugging your shoulder while you raise your arm. Keep your shoulder blade tucked down toward the middle of your back. 3. Hold for __________ seconds. 4. Slowly return to the starting position. Repeat __________ times. Complete this exercise __________ times a day. Exercise D:Internal Rotation   1. Place your left / right hand behind your back,  palm-up. 2. Use your other hand to dangle an exercise band, a towel, or a similar object over your shoulder. Grasp the band with your left / right hand so you are holding onto both ends. 3. Gently pull up on the band until you feel a stretch in the front of your left / right shoulder.  Avoid shrugging your shoulder while you raise your arm. Keep your shoulder blade tucked down toward the middle of your back. 4. Hold for __________ seconds. 5. Release the stretch by letting go of the band and lowering your hands. Repeat __________ times. Complete this exercise __________ times a day. STRETCHING EXERCISES  These exercises warm up your muscles and joints and improve the movement and flexibility of your shoulder. These exercises also help to relieve pain, numbness, and tingling. These exercises are done using your healthy shoulder to help stretch the muscles of your injured shoulder. Exercise E: Research officer, political party (External Rotation and Abduction)   1. Stand in a doorway with one of your feet slightly in front of the other. This is called a staggered stance. If you cannot reach your forearms to the door frame, stand facing a corner of a room. 2. Choose one of the following positions as told by your health care provider:  Place your hands and forearms on the door frame above your head.  Place your hands and forearms on the door frame at the height of your head.  Place your hands on the door frame at the height of your elbows. 3. Slowly move your weight onto your front foot until you feel a stretch across your chest and in the front of your shoulders. Keep your head and chest upright and keep your abdominal muscles tight. 4. Hold for __________ seconds. 5. To release the stretch, shift your weight to your back foot. Repeat __________ times. Complete this stretch __________ times a day. Exercise F:Extension, Standing  1. Stand and hold a broomstick, a cane, or a similar object behind your  back.  Your hands should be a little wider than shoulder-width apart.  Your palms should face away from your back. 2. Keeping your elbows straight and keeping your shoulder muscles relaxed, move the stick away from your body until you feel a stretch in your shoulder.  Avoid shrugging your shoulders while you move the stick. Keep your shoulder blade tucked down toward the middle of your back. 3. Hold for __________ seconds. 4. Slowly return to the starting position. Repeat __________ times. Complete this exercise __________ times a day. STRENGTHENING EXERCISES  These exercises build strength and endurance in your shoulder. Endurance is the ability to use your muscles for a long time, even after they get tired. Exercise G:External Rotation   1. Sit in a stable chair without armrests. 2. Secure an exercise band at elbow height on your left / right side. 3. Place a soft object, such as a folded towel or a small pillow, between your left / right upper  arm and your body to move your elbow a few inches away (about 10 cm) from your side. 4. Hold the end of the band so it is tight and there is no slack. 5. Keeping your elbow pressed against the soft object, move your left / right forearm out, away from your abdomen. Keep your body steady so only your forearm moves. 6. Hold for __________ seconds. 7. Slowly return to the starting position. Repeat __________ times. Complete this exercise __________ times a day. Exercise H:Shoulder Abduction   1. Sit in a stable chair without armrests, or stand. 2. Hold a __________ weight in your left / right hand, or hold an exercise band with both hands. 3. Start with your arms straight down and your left / right palm facing in, toward your body. 4. Slowly lift your left / right hand out to your side. Do not lift your hand above shoulder height unless your health care provider tells you that this is safe.  Keep your arms straight.  Avoid shrugging your  shoulder while you do this movement. Keep your shoulder blade tucked down toward the middle of your back. 5. Hold for __________ seconds. 6. Slowly lower your arm, and return to the starting position. Repeat __________ times. Complete this exercise __________ times a day. Exercise I:Shoulder Extension  1. Sit in a stable chair without armrests, or stand. 2. Secure an exercise band to a stable object in front of you where it is at shoulder height. 3. Hold one end of the exercise band in each hand. Your palms should face each other. 4. Straighten your elbows and lift your hands up to shoulder height. 5. Step back, away from the secured end of the exercise band, until the band is tight and there is no slack. 6. Squeeze your shoulder blades together as you pull your hands down to the sides of your thighs. Stop when your hands are straight down by your sides. Do not let your hands go behind your body. 7. Hold for __________ seconds. 8. Slowly return to the starting position. Repeat __________ times. Complete this exercise __________ times a day. Exercise J:Standing Shoulder Row  1. Sit in a stable chair without armrests, or stand. 2. Secure an exercise band to a stable object in front of you so it is at waist height. 3. Hold one end of the exercise band in each hand. Your palms should be in a thumbs-up position. 4. Bend each of your elbows to an "L" shape (about 90 degrees) and keep your upper arms at your sides. 5. Step back until the band is tight and there is no slack. 6. Slowly pull your elbows back behind you. 7. Hold for __________ seconds. 8. Slowly return to the starting position. Repeat __________ times. Complete this exercise __________ times a day. Exercise K:Shoulder Press-Ups   1. Sit in a stable chair that has armrests. Sit upright, with your feet flat on the floor. 2. Put your hands on the armrests so your elbows are bent and your fingers are pointing forward. Your hands  should be about even with the sides of your body. 3. Push down on the armrests and use your arms to lift yourself off of the chair. Straighten your elbows and lift yourself up as much as you comfortably can.  Move your shoulder blades down, and avoid letting your shoulders move up toward your ears.  Keep your feet on the ground. As you get stronger, your feet should support less of your  body weight as you lift yourself up. 4. Hold for __________ seconds. 5. Slowly lower yourself back into the chair. Repeat __________ times. Complete this exercise __________ times a day. Exercise L: Wall Push-Ups   1. Stand so you are facing a stable wall. Your feet should be about one arm-length away from the wall. 2. Lean forward and place your palms on the wall at shoulder height. 3. Keep your feet flat on the floor as you bend your elbows and lean forward toward the wall. 4. Hold for __________ seconds. 5. Straighten your elbows to push yourself back to the starting position. Repeat __________ times. Complete this exercise __________ times a day. This information is not intended to replace advice given to you by your health care provider. Make sure you discuss any questions you have with your health care provider. Document Released: 06/04/2005 Document Revised: 04/14/2016 Document Reviewed: 04/01/2015 Elsevier Interactive Patient Education  2017 [employer].

## 2016-10-28 NOTE — Progress Notes (Signed)
Rheumatology Medication Review by a Pharmacist Does the patient feel that his/her medications are working for him/her?  Yes Has the patient been experiencing any side effects to the medications prescribed?  No Does the patient have any problems obtaining medications?  No  Issues to address at subsequent visits: None   Pharmacist comments:  [name] is a pleasant 51 yo M who presents for follow up of his rheumatoid arthritis.  He is currently taking Enbrel 50 mg weekly.  Most recent standing labs were on 09/17/16.  He will be due for standing labs again in May 2018.  Most recent TB Gold was negative on 11/28/15.  He will be due for TB Gold again in April 2018.  Patient denies any questions or concerns regarding his medications at this time.   Lilla Shook, Pharm.D., BCPS, CPP Clinical Pharmacist Pager: [phone] Phone: [phone] 10/28/2016 3:18 PM

## 2016-12-15 ENCOUNTER — Other Ambulatory Visit: Payer: Self-pay

## 2016-12-15 DIAGNOSIS — Z79899 Other long term (current) drug therapy: Secondary | ICD-10-CM

## 2016-12-15 LAB — CBC WITH DIFFERENTIAL/PLATELET
Basophils Absolute: 105 cells/uL (ref 0–200)
Basophils Relative: 1 %
Eosinophils Absolute: 735 cells/uL — ABNORMAL HIGH (ref 15–500)
Eosinophils Relative: 7 %
HCT: 38.5 % (ref 38.5–50.0)
Hemoglobin: 12.5 g/dL — ABNORMAL LOW (ref 13.2–17.1)
Lymphocytes Relative: 33 %
Lymphs Abs: 3465 cells/uL (ref 850–3900)
MCH: 30.3 pg (ref 27.0–33.0)
MCHC: 32.5 g/dL (ref 32.0–36.0)
MCV: 93.4 fL (ref 80.0–100.0)
MPV: 10.5 fL (ref 7.5–12.5)
Monocytes Absolute: 525 cells/uL (ref 200–950)
Monocytes Relative: 5 %
Neutro Abs: 5670 cells/uL (ref 1500–7800)
Neutrophils Relative %: 54 %
Platelets: 233 10*3/uL (ref 140–400)
RBC: 4.12 MIL/uL — ABNORMAL LOW (ref 4.20–5.80)
RDW: 13.7 % (ref 11.0–15.0)
WBC: 10.5 10*3/uL (ref 3.8–10.8)

## 2016-12-16 LAB — COMPLETE METABOLIC PANEL WITH GFR
ALT: 23 U/L (ref 9–46)
AST: 21 U/L (ref 10–35)
Albumin: 3.9 g/dL (ref 3.6–5.1)
Alkaline Phosphatase: 76 U/L (ref 40–115)
BUN: 13 mg/dL (ref 7–25)
CO2: 23 mmol/L (ref 20–31)
Calcium: 8.7 mg/dL (ref 8.6–10.3)
Chloride: 105 mmol/L (ref 98–110)
Creat: 1.5 mg/dL — ABNORMAL HIGH (ref 0.70–1.33)
GFR, Est African American: 61 mL/min (ref >=60)
GFR, Est Non African American: 53 mL/min — ABNORMAL LOW (ref >=60)
Glucose, Bld: 211 mg/dL — ABNORMAL HIGH (ref 65–99)
Potassium: 4.3 mmol/L (ref 3.5–5.3)
Sodium: 139 mmol/L (ref 135–146)
Total Bilirubin: 0.3 mg/dL (ref 0.2–1.2)
Total Protein: 6.4 g/dL (ref 6.1–8.1)

## 2016-12-17 LAB — QUANTIFERON TB GOLD ASSAY (BLOOD)
Interferon Gamma Release Assay: NEGATIVE
Mitogen-Nil: 8.65 IU/mL
Quantiferon Nil Value: 0.14 IU/mL
Quantiferon Tb Ag Minus Nil Value: 0.05 IU/mL

## 2016-12-25 DIAGNOSIS — R197 Diarrhea, unspecified: Secondary | ICD-10-CM | POA: Insufficient documentation

## 2016-12-25 DIAGNOSIS — G4700 Insomnia, unspecified: Secondary | ICD-10-CM | POA: Insufficient documentation

## 2016-12-31 ENCOUNTER — Other Ambulatory Visit: Payer: Self-pay | Admitting: *Deleted

## 2016-12-31 NOTE — Telephone Encounter (Signed)
ok 

## 2016-12-31 NOTE — Telephone Encounter (Signed)
Last Visit: 10/28/16 Next Visit: 03/30/17 Labs: 12/15/16 Creat 1.50 GFR 53 Previous Creat 1.34 GFR 60 TB Gold: 12/15/16 Neg   Okay to refill Enbrel?

## 2017-01-01 MED ORDER — ETANERCEPT 50 MG/ML ~~LOC~~ SOAJ: 50.0000 mg | SUBCUTANEOUS | 2 refills | Status: DC

## 2017-02-05 ENCOUNTER — Encounter: Payer: Self-pay | Admitting: Neurology

## 2017-02-05 ENCOUNTER — Ambulatory Visit (INDEPENDENT_AMBULATORY_CARE_PROVIDER_SITE_OTHER): Payer: 59 | Admitting: Neurology

## 2017-02-05 VITALS — BP 102/63 | HR 58 | Ht 68.0 in | Wt 184.0 lb

## 2017-02-05 DIAGNOSIS — Z789 Other specified health status: Secondary | ICD-10-CM

## 2017-02-05 DIAGNOSIS — G478 Other sleep disorders: Secondary | ICD-10-CM | POA: Diagnosis not present

## 2017-02-05 DIAGNOSIS — R0683 Snoring: Secondary | ICD-10-CM | POA: Diagnosis not present

## 2017-02-05 DIAGNOSIS — Z951 Presence of aortocoronary bypass graft: Secondary | ICD-10-CM

## 2017-02-05 DIAGNOSIS — R400 Somnolence: Secondary | ICD-10-CM | POA: Diagnosis not present

## 2017-02-05 DIAGNOSIS — E663 Overweight: Secondary | ICD-10-CM

## 2017-02-05 DIAGNOSIS — F17200 Nicotine dependence, unspecified, uncomplicated: Secondary | ICD-10-CM

## 2017-02-05 NOTE — Progress Notes (Signed)
Subjective:    Patient ID: [name] is a 51 y.o. male.  HPI     [name] Foley, MD, PhD [facility] [address], [address] [address] [place], [place] [address]  Dear Dr. Eloise Harman,   I saw your patient, [name], upon your kind request in my neurologic clinic today for initial consultation of his sleep disorder, in particular, concern for underlying obstructive sleep apnea. The patient is unaccompanied today. As you know, [name] is a 51 year old right-handed gentleman with an underlying medical history of coronary artery disease with status post 5 vessel CABG in 2015, RA on Enbrel, diabetes type 1 (age 51), with retinopathy (s/p laser surgeries), chronic renal disease, asthma, smoking with prior cessation, hyperlipidemia, hypertension, and overweight state, who reports snoring and excessive daytime somnolence. I reviewed your office note from 12/25/2016, which you kindly included. He is divorced, he has 3 daughters, ages 30, 45, and 39.He lives at home with his 24 year old daughter and his oldest daughter, as well as her 2 daughters. He works at the [employer]. He denies RLS Symptoms, no FHx of OSA.  He denies night to night nocturia. He currently smokes about 5 cigarettes per day and quit smoking at the time of his open heart surgery. He drinks alcohol rarely, drinks quite a bit of caffeine in the form of coffee 6-8 cups per day and diet green tea 3 bottles per day. He may average up to 2 bottles of water per day but typically not more than that. He denies painful peripheral neuropathy. Bedtime is around 9 or latest by 10 PM, wakeup time around 5 AM. He does have difficulty staying asleep. He denies any significant joint pain at this time, with the exception of occasional right shoulder pain and hand pain.   His Past Medical History Is Significant For: Past Medical History:  Diagnosis Date  . Anginal pain (HCC)   . Arthritis    RA IN HANDS  .  Asthma   . CAD (coronary artery disease) 04/27/2014  . Chronic renal disease, stage 3, moderately decreased glomerular filtration rate between 30-59 mL/min/1.73 square meter 05/03/2014  . Coronary artery disease   . Diabetes (HCC) 05/03/2014  . Diabetes mellitus without complication (HCC)    type 1  . 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/03/2014  . Shortness of breath   . Unstable angina pectoris (HCC) 04/25/2014    His Past Surgical History Is Significant For: Past Surgical History:  Procedure Laterality Date  . CARDIAC CATHETERIZATION  04/25/2014   BY DR Jacinto Halim  . CARDIAC CATHETERIZATION N/A 10/30/2015   Procedure: Left Heart Cath and Cors/Grafts Angiography;  Surgeon: Yates Decamp, MD;  Location: [facility] INVASIVE CV LAB;  Service: Cardiovascular;  Laterality: N/A;  . CARPAL TUNNEL RELEASE    . CORONARY ARTERY BYPASS GRAFT N/A 04/27/2014   Procedure: CORONARY ARTERY BYPASS GRAFTING on pump using left internal mammary artery to LAD coronary artery, right great saphenous vein graft to diagonal coronary artery with sequential to OM1 and circumflex coronary arteries. Right greater saphenous vein graft to posterior descending coronary artery. ;  Surgeon: Delight Ovens, MD;  Location: [facility] OR;  Service: Open Heart Surgery;  Laterality: N  . ENDOVEIN HARVEST OF GREATER SAPHENOUS VEIN Right 04/27/2014   Procedure: ENDOVEIN HARVEST OF GREATER SAPHENOUS VEIN;  Surgeon: Delight Ovens, MD;  Location: [facility] OR;  Service: Open Heart Surgery;  Laterality: Right;  . EYE SURGERY     LASER  .  INTRAOPERATIVE TRANSESOPHAGEAL ECHOCARDIOGRAM N/A 04/27/2014   Procedure: INTRAOPERATIVE TRANSESOPHAGEAL ECHOCARDIOGRAM;  Surgeon: Delight Ovens, MD;  [facility] OR;  Service: Open Heart Surgery;  Laterality: N/A;  . LEFT HEART CATHETERIZATION WITH CORONARY ANGIOGRAM N/A 04/25/2014   Procedure: LEFT HEART CATHETERIZATION WITH CORONARY ANGIOGRAM;  Surgeon: Pamella Pert, MD;  Location: [facility] CATH LAB;   Service: Cardiovascular;  Laterality: N/A;    His Family History Is Significant For: Family History  Problem Relation Age of Onset  . Prostate cancer Father   . Heart disease Father   . Stomach cancer Mother   . Heart disease Brother     His Social History Is Significant For: Social History   Social History  . Marital status: Married    Spouse name: N/A  . Number of children: N/A  . Years of education: N/A   Social History Main Topics  . Smoking status: Former Smoker    Packs/day: 2.00    Years: 34.00    Types: Cigarettes    Quit date: 04/24/2014  . Smokeless tobacco: Never Used     Comment: currently using E cigarette  . Alcohol use 0.0 oz/week     Comment: RARE  . Drug use: No  . Sexual activity: Not Asked   Other Topics Concern  . None   Social History Narrative  . None    His Allergies Are:  Allergies  Allergen Reactions  . Morphine And Related Nausea And Vomiting  :   His Current Medications Are:  Outpatient Encounter Prescriptions as of 02/05/2017  Medication Sig  . albuterol (PROVENTIL HFA;VENTOLIN HFA) 108 (90 BASE) MCG/ACT inhaler Inhale 1-2 puffs into the lungs every 4 (four) hours as needed for wheezing or shortness of breath.  Marland Kitchen amLODipine (NORVASC) 5 MG tablet Take 5 mg by mouth daily.   Marland Kitchen aspirin 81 MG tablet Take 81 mg by mouth daily.  Marland Kitchen buPROPion (WELLBUTRIN SR) 150 MG 12 hr tablet Take 75 mg by mouth daily.   . clopidogrel (PLAVIX) 75 MG tablet Take 75 mg by mouth daily with breakfast.   . etanercept (ENBREL SURECLICK) 50 MG/ML injection Inject 0.98 mLs (50 mg total) into the skin once a week.  . insulin glargine (LANTUS) 100 UNIT/ML injection Inject 37 Units into the skin at bedtime.   . insulin lispro (HUMALOG) 100 UNIT/ML injection Inject 4-17 Units into the skin See admin instructions. Inject 3-4 times daily with meals per sliding scale instructions on glucose meter: 1 unit for every 10 grams of carbs and CBG calculations  . metoprolol  (LOPRESSOR) 50 MG tablet Take 50 mg by mouth daily.  . nitroGLYCERIN (NITROSTAT) 0.4 MG SL tablet Place 0.4 mg under the tongue every 5 (five) minutes as needed for chest pain.   . Probiotic Product (PROBIOTIC PO) Take 1 tablet by mouth daily.  . rosuvastatin (CRESTOR) 10 MG tablet Take 10 mg by mouth daily.  . tadalafil (CIALIS) 20 MG tablet Take 20 mg by mouth daily as needed for erectile dysfunction.  . valsartan (DIOVAN) 40 MG tablet Take 40 mg by mouth daily.   . [DISCONTINUED] docusate sodium (COLACE) 250 MG capsule Take 500 mg by mouth daily.   No facility-administered encounter medications on file as of 02/05/2017.   : Review of Systems:  Out of a complete 14 point review of systems, all are reviewed and negative with the exception of these symptoms as listed below:  Review of Systems  Neurological:       Pt presents  today to discuss his sleep. Pt has never had a sleep study but does endorse snoring.  Epworth Sleepiness Scale 0= would never doze 1= slight chance of dozing 2= moderate chance of dozing 3= high chance of dozing  Sitting and reading: 1 Watching TV: 1 Sitting inactive in a public place (ex. Theater or meeting): 0 As a passenger in a car for an hour without a break: 0 Lying down to rest in the afternoon: 3 Sitting and talking to someone: 0 Sitting quietly after lunch (no alcohol): 1 In a car, while stopped in traffic: 0 Total: 6    Objective:  Neurological Exam  Physical Exam Physical Examination:   Vitals:   02/05/17 1603  BP: 102/63  Pulse: (!) 58   General Examination: The patient is a very pleasant 51 y.o. male in no acute distress. He appears well-developed and well-nourished and well groomed.    HEENT: Normocephalic, atraumatic, pupils are equal, round and reactive to light and accommodation. Extraocular tracking is good without limitation to gaze excursion or nystagmus noted. Normal smooth pursuit is noted. Hearing is grossly intact. Face is  symmetric with normal facial animation and normal facial sensation. Speech is clear with no dysarthria noted. There is no hypophonia. There is no lip, neck/head, jaw or voice tremor. Neck is supple with full range of passive and active motion. There are no carotid bruits on auscultation. Oropharynx exam reveals: mild mouth dryness, adequate dental hygiene and moderate airway crowding, due to smaller airway entry, large uvula, tonsils are small and not fully visualized. Mallampati is class II. He has a mild overbite. Tongue protrudes centrally and palate elevates symmetrically.    Chest: Clear to auscultation without wheezing, rhonchi or crackles noted.  Heart: S1+S2+0, regular and normal without murmurs, rubs or gallops noted.   Abdomen: Soft, non-tender and non-distended with normal bowel sounds appreciated on auscultation.  Extremities: There is no pitting edema in the distal lower extremities bilaterally. Pedal pulses are intact.  Skin: Warm and dry without trophic changes noted. There are no varicose veins. S/p vein harvesting R leg.   Musculoskeletal: exam reveals no obvious joint deformities, tenderness or joint swelling or erythema.   Neurologically:  Mental status: The patient is awake, alert and oriented in all 4 spheres. His immediate and remote memory, attention, language skills and fund of knowledge are appropriate. There is no evidence of aphasia, agnosia, apraxia or anomia. Speech is clear with normal prosody and enunciation. Thought process is linear. Mood is normal and affect is normal.  Cranial nerves II - XII are as described above under HEENT exam. In addition: shoulder shrug is normal with equal shoulder height noted. Motor exam: Normal bulk, strength and tone is noted. There is no drift, tremor or rebound. Romberg is negative. Reflexes are 2+ throughout. Fine motor skills and coordination: intact with normal finger taps, normal hand movements, normal rapid alternating patting,  normal foot taps and normal foot agility.  Cerebellar testing: No dysmetria or intention tremor on finger to nose testing. Heel to shin is unremarkable bilaterally. There is no truncal or gait ataxia.  Sensory exam: intact to light touch in the upper and lower extremities.  Gait, station and balance: He stands easily. No veering to one side is noted. No leaning to one side is noted. Posture is age-appropriate and stance is narrow based. Gait shows normal stride length and normal pace. No problems turning are noted. Tandem walk is unremarkable.   Assessment and Plan:  In summary, [name] is a very pleasant 51 y.o.-year old male with an underlying complex medical history of coronary artery disease with status post 5 vessel CABG in 2015, RA on Enbrel, diabetes type 1 (age 19), with retinopathy (s/p laser surgeries), chronic renal disease, asthma, smoking with prior cessation, hyperlipidemia, hypertension, and overweight state, whose history and physical exam are concerning for obstructive sleep apnea (OSA). I had a long chat with the patient about my findings and the diagnosis of OSA, its prognosis and treatment options. We talked about medical treatments, surgical interventions and non-pharmacological approaches. I explained in particular the risks and ramifications of untreated moderate to severe OSA, especially with respect to developing cardiovascular disease down the Road, including congestive heart failure, difficult to treat hypertension, cardiac arrhythmias, or stroke. Even type 2 diabetes has, in part, been linked to untreated OSA. Symptoms of untreated OSA include daytime sleepiness, memory problems, mood irritability and mood disorder such as depression and anxiety, lack of energy, as well as recurrent headaches, especially morning headaches. We talked about smoking cessation and trying to maintain a healthy lifestyle in general, as well as the importance of weight control. I encouraged the  patient to eat healthy, exercise daily and keep well hydrated, to keep a scheduled bedtime and wake time routine, to not skip any meals and eat healthy snacks in between meals. I advised the patient not to drive when feeling sleepy. I recommended the following at this time: sleep study with potential positive airway pressure titration. (We will score hypopneas at 4%).  He is furthermore strongly advised to reduce his caffeine intake and increase his water intake.  I explained the sleep test procedure to the patient and also outlined possible surgical and non-surgical treatment options of OSA, including the use of a custom-made dental device (which would require a referral to a specialist dentist or oral surgeon), upper airway surgical options, such as pillar implants, radiofrequency surgery, tongue base surgery, and UPPP (which would involve a referral to an ENT surgeon). Rarely, jaw surgery such as mandibular advancement may be considered.  I also explained the CPAP treatment option to the patient, who indicated that he would be willing to try CPAP if the need arises. I explained the importance of being compliant with PAP treatment, not only for insurance purposes but primarily to improve His symptoms, and for the patient's long term health benefit, including to reduce His cardiovascular risks. I answered all his questions today and the patient was in agreement. I would like to see him back after the sleep study is completed and encouraged him to call with any interim questions, concerns, problems or updates.   Thank you very much for allowing me to participate in the care of this nice patient. If I can be of any further assistance to you please do not hesitate to call me at [phone].  Sincerely,   [name] Foley, MD, PhD

## 2017-02-05 NOTE — Patient Instructions (Signed)

## 2017-02-17 DIAGNOSIS — R141 Gas pain: Secondary | ICD-10-CM | POA: Insufficient documentation

## 2017-03-03 ENCOUNTER — Ambulatory Visit (INDEPENDENT_AMBULATORY_CARE_PROVIDER_SITE_OTHER): Payer: 59 | Admitting: Neurology

## 2017-03-03 DIAGNOSIS — Z951 Presence of aortocoronary bypass graft: Secondary | ICD-10-CM

## 2017-03-03 DIAGNOSIS — G4733 Obstructive sleep apnea (adult) (pediatric): Secondary | ICD-10-CM | POA: Diagnosis not present

## 2017-03-03 DIAGNOSIS — G4761 Periodic limb movement disorder: Secondary | ICD-10-CM

## 2017-03-03 DIAGNOSIS — G4720 Circadian rhythm sleep disorder, unspecified type: Secondary | ICD-10-CM

## 2017-03-05 ENCOUNTER — Telehealth: Payer: Self-pay

## 2017-03-05 NOTE — Addendum Note (Signed)
Addended by: [name] on: 03/05/2017 08:21 AM   Modules accepted: Orders

## 2017-03-05 NOTE — Telephone Encounter (Signed)
-----   Message from [name], MD sent at 03/05/2017  8:21 AM EDT ----- Patient referred by Dr. Eloise Harman, seen by me on 02/05/17, diagnostic PSG on 03/03/17.    Please call and notify the patient that the recent sleep study did confirm the diagnosis of mild obstructive sleep apnea, but given his medical Hx and sleep related complaints I recommend treatment in the form of CPAP. Of note, he had significant PLMs, but denied RLS, has painful neuropathy. Also mild decrease in REM latency and increase in REM percentage, don't know how this all ties in, but would be interesting to see during next study.  Please explain to patient and arrange for a CPAP titration study. I have placed an order in the chart. Thanks, and please route to [facility] for scheduling next sleep study.  [name], MD, PhD [facility])

## 2017-03-05 NOTE — Procedures (Signed)
PATIENT'S NAME:  [name], [name] DOB:      [birth year]      MR#:    [id number]     DATE OF RECORDING: 03/03/2017 REFERRING M.D.:  [name] MD Study Performed:   Baseline Polysomnogram HISTORY: 51 year old man with a history of CAD (5 vessel CABG in 2015), RA, DM type 1 (age 70), with retinopathy (s/p laser surgeries), CKD, asthma, smoking with prior cessation, hyperlipidemia, hypertension, and overweight state, who reports snoring and excessive daytime somnolence. Epworth Sleepiness Scale at 6/24 points. The patient's weight 184 pounds with a height of 68 (inches), resulting in a BMI of 27.7 kg/m2. The patient's neck circumference measured 17 inches.  CURRENT MEDICATIONS: Albuterol, Amlodipine, Aspirin, Bupropioin, Clopidogrel, Etanercept, Metoprolol, Nitroglycerin, Rosuvastatin, Tadalafil and Valsartan.   PROCEDURE:  This is a multichannel digital polysomnogram utilizing the Somnostar 11.2 system.  Electrodes and sensors were applied and monitored per AASM Specifications.   EEG, EOG, Chin and Limb EMG, were sampled at 200 Hz.  ECG, Snore and Nasal Pressure, Thermal Airflow, Respiratory Effort, CPAP Flow and Pressure, Oximetry was sampled at 50 Hz. Digital video and audio were recorded.      BASELINE STUDY  Lights Out was at 22:14 and Lights On at 05:13. Total recording time (TRT) was 419.5 minutes, with a total sleep time (TST) of 361 minutes.   The patient's sleep latency was 35.5 minutes, which is delayed. REM latency was 49.5 minutes, which is reduced. The sleep efficiency was 86.1 %.     SLEEP ARCHITECTURE: WASO (Wake after sleep onset) was 27.5 minutes, with mild sleep fragmentation noted. There were 16 minutes in Stage N1, 242 minutes Stage N2, 0 minutes Stage N3 and 103 minutes in Stage REM.  The percentage of Stage N1 was 4.4%, Stage N2 was 67.%, which is increased, Stage N3 was absent, and Stage R (REM sleep) was 28.5%, which is increased. The arousals were noted as: 18 were  spontaneous, 18 were associated with PLMs, 36 were associated with respiratory events.  Audio and video analysis did not show any abnormal or unusual movements, behaviors, phonations or vocalizations. The patient took 1 bathroom break. Moderate snoring was noted. The EKG was in keeping with normal sinus rhythm (NSR).  RESPIRATORY ANALYSIS:  There were a total of 36 respiratory events:  0 obstructive apneas, 0 central apneas and 0 mixed apneas with a total of 0 apneas and an apnea index (AI) of 0 /hour. There were 36 hypopneas with a hypopnea index of 6. /hour. The patient also had 0 respiratory event related arousals (RERAs).      The total APNEA/HYPOPNEA INDEX (AHI) was 6./hour and the total RESPIRATORY DISTURBANCE INDEX was 6. /hour.  24 events occurred in REM sleep and 24 events in NREM. The REM AHI was 14. /hour, versus a non-REM AHI of 2.8. The patient spent 50 minutes of total sleep time in the supine position and 311 minutes in non-supine.. The supine AHI was 7.2 versus a non-supine AHI of 5.8.  OXYGEN SATURATION & C02:  The Wake baseline 02 saturation was 91%, with the lowest being 84%. Time spent below 89% saturation equaled 9 minutes.  PERIODIC LIMB MOVEMENTS: The patient had a total of 158 Periodic Limb Movements.  The Periodic Limb Movement (PLM) index was 26.3 and the PLM Arousal index was 3./hour. Post-study, the patient indicated that sleep was worse than usual.   IMPRESSION: 1. Obstructive Sleep Apnea (OSA) 2. Periodic Limb Movement Disorder (PLMD) 3. Dysfunctions associated with sleep stages  or arousal from sleep  RECOMMENDATIONS: 1. This study demonstrates overall mild obstructive sleep apnea, more pronounced during REM sleep with a total AHI of 6/hour, REM AHI of 14/hour, supine AHI of 7.2, and O2 nadir of 84%. Given the patient's medical history and particularly cardiac history, and his sleep related complaints, a full-night CPAP titration study is recommended to optimize  therapy. Other treatment options may include avoidance of supine sleep position along with weight loss, upper airway or jaw surgery in selected patients or the use of an oral appliance in certain patients. ENT evaluation and/or consultation with a maxillofacial surgeon or dentist may be feasible in some instances.    2. Please note that untreated obstructive sleep apnea carries additional perioperative morbidity. Patients with significant obstructive sleep apnea should receive perioperative PAP therapy and the surgeons and particularly the anesthesiologist should be informed of the diagnosis and the severity of the sleep disordered breathing. 3. Moderate PLMs (periodic limb movements of sleep) were noted during this study with minimal arousals; clinical correlation is recommended.  4. This study shows sleep fragmentation and abnormal sleep stage percentages; these are nonspecific findings and per se do not signify an intrinsic sleep disorder or a cause for the patient's sleep-related symptoms. Causes include (but are not limited to) the first night effect of the sleep study, circadian rhythm disturbances, medication effect or an underlying mood disorder or medical problem. In particular, a mildly decreased REM latency was noted and increased REM percentage; clinical correlation is recommended. It would be interesting to see if this is noted again during the CPAP titration study.  5. The patient should be cautioned not to drive, work at heights, or operate dangerous or heavy equipment when tired or sleepy. Review and reiteration of good sleep hygiene measures should be pursued with any patient. 6. The patient will be seen in follow-up by Dr. Frances Furbish at [facility] for discussion of the test results and further management strategies. The referring provider will be notified of the test results.  I certify that I have reviewed the entire raw data recording prior to the issuance of this report in accordance with the  Standards of Accreditation of the American Academy of Sleep Medicine (AASM)  [name], MD, PhD Diplomat, American Board of Psychiatry and Neurology (Neurology and Sleep Medicine)

## 2017-03-05 NOTE — Telephone Encounter (Signed)
He may be a candidate for an oral appl but CPAP may be more effective and faster. Some insurances don't pay for an OA, unless you fail CPAP first.  It is up to patient, whichever he wants to pursue. We can do a FU appt too if he thinks it may help to make a decision. I will not push him to try CPAP, but he may want to consider it, as he may feel better and if insurance covers treatment and the CPAP study, me may want to give it a try. Please related back to pt.

## 2017-03-05 NOTE — Telephone Encounter (Signed)
I called pt. I advised pt that Dr. Frances Furbish reviewed their sleep study results and found that pt has mild osa, but given his medical history and sleep related complaints, Dr. Frances Furbish recommends treatment in the form of cpap. Dr. Frances Furbish recommends that pt return for a repeat sleep study in order to properly titrate the cpap and ensure a good mask fit. I advised pt that our sleep lab will file with pt's insurance and call pt to schedule the sleep study when we hear back from the pt's insurance regarding coverage of this sleep study. I advised pt that he had significant PLMs, but denied RLS, has a history of painful neuropathy. I advised pt that he also had a mild decrease in REM latency and increase in REM percentage and that Dr. Frances Furbish is unsure of how this ties in with pt's history. Pt is reluctant to undergo another sleep study and is wondering if he is a candidate for the oral appliance for osa. I did recommended a cpap trial before an oral appliance, given pt's medical history, but pt wants to know Dr. Teofilo Pod thoughts on an oral appliance. He promised to consider the cpap titration in the meantime. Pt verbalized understanding of results.

## 2017-03-05 NOTE — Progress Notes (Signed)
Patient referred by Dr. Eloise Harman, seen by me on 02/05/17, diagnostic PSG on 03/03/17.    Please call and notify the patient that the recent sleep study did confirm the diagnosis of mild obstructive sleep apnea, but given his medical Hx and sleep related complaints I recommend treatment in the form of CPAP. Of note, he had significant PLMs, but denied RLS, has painful neuropathy. Also mild decrease in REM latency and increase in REM percentage, don't know how this all ties in, but would be interesting to see during next study.  Please explain to patient and arrange for a CPAP titration study. I have placed an order in the chart. Thanks, and please route to [facility] for scheduling next sleep study.  [name], MD, PhD [facility])

## 2017-03-05 NOTE — Telephone Encounter (Signed)
I called pt. I advised him that Dr. Frances Furbish says he may be a candidate for an oral appliance but the cpap may be more effective and faster. Some insurances require a failed cpap trial before covering an OA. I offered pt a follow up appt with Dr. Frances Furbish to discuss further and he accepted an appt on 04/07/17 at 1:00pm. In the meantime, pt will consider cpap and ask his dentist if they make an OA.

## 2017-03-09 ENCOUNTER — Other Ambulatory Visit: Payer: Self-pay | Admitting: Gastroenterology

## 2017-03-09 DIAGNOSIS — R140 Abdominal distension (gaseous): Secondary | ICD-10-CM

## 2017-03-09 DIAGNOSIS — R197 Diarrhea, unspecified: Secondary | ICD-10-CM

## 2017-03-09 DIAGNOSIS — R10813 Right lower quadrant abdominal tenderness: Secondary | ICD-10-CM

## 2017-03-12 ENCOUNTER — Ambulatory Visit
Admission: RE | Admit: 2017-03-12 | Discharge: 2017-03-12 | Disposition: A | Discharge status: Home / Self Care | Payer: 59 | Source: Ambulatory Visit | Attending: Gastroenterology | Admitting: Gastroenterology

## 2017-03-12 DIAGNOSIS — R197 Diarrhea, unspecified: Secondary | ICD-10-CM

## 2017-03-12 DIAGNOSIS — R140 Abdominal distension (gaseous): Secondary | ICD-10-CM

## 2017-03-12 DIAGNOSIS — R10813 Right lower quadrant abdominal tenderness: Secondary | ICD-10-CM

## 2017-03-12 MED ORDER — IOPAMIDOL (ISOVUE-300) INJECTION 61%
100.0000 mL | Freq: Once | INTRAVENOUS | Status: AC | PRN
  Administered 2017-03-12: 100 mL via INTRAVENOUS

## 2017-03-20 ENCOUNTER — Other Ambulatory Visit: Payer: Self-pay | Admitting: Rheumatology

## 2017-03-23 NOTE — Telephone Encounter (Signed)
10/28/16 last visit 03/30/17 next visit   CBC Latest Ref Rng & Units 12/15/2016 09/17/2016 06/01/2014  WBC 3.8 - 10.8 K/uL 10.5 10.2 9.8  Hemoglobin 13.2 - 17.1 g/dL 12.5(L) 12.9(L) 11.3(L)  Hematocrit 38.5 - 50.0 % 38.5 40.1 34.7(L)  Platelets 140 - 400 K/uL 233 237 383.0     CMP Latest Ref Rng & Units 12/15/2016 09/17/2016 06/01/2014  Glucose 65 - 99 mg/dL 045(W) 098(J) 19(J)  BUN 7 - 25 mg/dL 13 12 15   Creatinine 0.70 - 1.33 mg/dL ) 4.78(G) 1.3  Sodium 135 - 146 mmol/L 139 139 137  Potassium 3.5 - 5.3 mmol/L 4.3 4.3 4.3  Chloride 98 - 110 mmol/L 105 104 103  CO2 20 - 31 mmol/L 23 27 26   Calcium 8.6 - 10.3 mg/dL 8.7 9.1 9.3  Total Protein 6.1 - 8.1 g/dL 6.4 6.6 -  Total Bilirubin 0.2 - 1.2 mg/dL 0.3 0.4 -  Alkaline Phos 40 - 115 U/L 76 85 -  AST 10 - 35 U/L 21 20 -  ALT 9 - 46 U/L 23 24 -   12/15/16 TB gold negative      LABS due / will call patient to advise

## 2017-03-23 NOTE — Telephone Encounter (Signed)
Mychart message sent.

## 2017-03-24 NOTE — Progress Notes (Signed)
Office Visit Note  Patient: [name]             Date of Birth: 1966-05-06           MRN: [id number]             PCP: [name], MD Referring: [name], MD Visit Date: 03/30/2017 Occupation: @GUAROCC @    Subjective:  Hand pain.   History of Present Illness: [name] is a 51 y.o. male with history of sero positive rheumatoid arthritis. He states his occasional discomfort in his hands which gets better in less than 24 hours usually. He has not had much in joint swelling. He's been tolerating Enbrel quite well. His knee joints are doing well. He has some discomfort in his shoulder joints especially at night when he sleeps on his side. He states he does get some constipation from all the medications he's been taking.   Activities of Daily Living:  Patient reports morning stiffness for 20 minutes.   Patient Reports nocturnal pain. Shoulder joints Difficulty dressing/grooming: Denies Difficulty climbing stairs: Denies Difficulty getting out of chair: Denies Difficulty using hands for taps, buttons, cutlery, and/or writing: Denies   Review of Systems  Constitutional: Negative for fatigue, night sweats and weakness ( ).  HENT: Positive for mouth dryness. Negative for mouth sores and nose dryness.   Eyes: Negative for redness and dryness.  Respiratory: Negative for shortness of breath and difficulty breathing.   Cardiovascular: Positive for hypertension. Negative for chest pain, palpitations, irregular heartbeat and swelling in legs/feet.  Gastrointestinal: Positive for constipation. Negative for diarrhea.  Endocrine: Negative for increased urination.  Musculoskeletal: Positive for arthralgias, joint pain and morning stiffness. Negative for joint swelling, myalgias, muscle weakness, muscle tenderness and myalgias.  Skin: Negative for color change, rash, hair loss, nodules/bumps, skin tightness, ulcers and sensitivity to sunlight.  Allergic/Immunologic:  Negative for susceptible to infections.  Neurological: Negative for dizziness, fainting, memory loss and night sweats.  Hematological: Negative for swollen glands.  Psychiatric/Behavioral: Negative for depressed mood and sleep disturbance. The patient is not nervous/anxious.     PMFS History:  Patient Active Problem List   Diagnosis Date Noted  . Dyslipidemia 03/26/2017  . High risk medication use 09/23/2016  . History of hypertension 09/23/2016  . History of chronic kidney disease 09/23/2016  . History of coronary artery disease 09/23/2016  . Tobacco abuse 09/23/2016  . Primary osteoarthritis of both knees 09/23/2016  . History of diabetes mellitus 09/23/2016  . 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 Chest pain with high risk for cardiac etiology 10/29/2015  . Asymptomatic bilateral carotid artery stenosis 06/08/2014  . Pleural effusion, bilateral 06/03/2014  . Dyspnea 06/01/2014  . Diabetes (HCC) 05/03/2014  . 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/03/2014  . 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 Chronic renal disease, stage 3, moderately decreased glomerular filtration rate between 30-59 mL/min/1.73 square meter 05/03/2014  . CAD (coronary artery disease) 04/27/2014    Past Medical History:  Diagnosis Date  . Anginal pain (HCC)   . Arthritis    RA IN HANDS  . Asthma   . CAD (coronary artery disease) 04/27/2014  . Chronic renal disease, stage 3, moderately decreased glomerular filtration rate between 30-59 mL/min/1.73 square meter 05/03/2014  . Coronary artery disease   . Diabetes (HCC) 05/03/2014  . Diabetes mellitus without complication (HCC)    type 1  . 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/03/2014  . Shortness of breath   .  Unstable angina pectoris (HCC) 04/25/2014    Family History  Problem Relation Age of Onset  . Prostate cancer Father   . Heart disease Father   . Stomach cancer Mother   . Heart disease Brother    Past Surgical  History:  Procedure Laterality Date  . CARDIAC CATHETERIZATION  04/25/2014   BY DR Jacinto Halim  . CARDIAC CATHETERIZATION N/A 10/30/2015   Procedure: Left Heart Cath and Cors/Grafts Angiography;  Surgeon: Yates Decamp, MD;  Location: [facility] INVASIVE CV LAB;  Service: Cardiovascular;  Laterality: N/A;  . CARPAL TUNNEL RELEASE    . CORONARY ARTERY BYPASS GRAFT N/A 04/27/2014   Procedure: CORONARY ARTERY BYPASS GRAFTING on pump using left internal mammary artery to LAD coronary artery, right great saphenous vein graft to diagonal coronary artery with sequential to OM1 and circumflex coronary arteries. Right greater saphenous vein graft to posterior descending coronary artery. ;  Surgeon: Delight Ovens, MD;  Location: [facility] OR;  Service: Open Heart Surgery;  Laterality: N  . ENDOVEIN HARVEST OF GREATER SAPHENOUS VEIN Right 04/27/2014   Procedure: ENDOVEIN HARVEST OF GREATER SAPHENOUS VEIN;  Surgeon: Delight Ovens, MD;  Location: [facility] OR;  Service: Open Heart Surgery;  Laterality: Right;  . EYE SURGERY     LASER  . INTRAOPERATIVE TRANSESOPHAGEAL ECHOCARDIOGRAM N/A 04/27/2014   Procedure: INTRAOPERATIVE TRANSESOPHAGEAL ECHOCARDIOGRAM;  Surgeon: Delight Ovens, MD;  Location: [facility] OR;  Service: Open Heart Surgery;  Laterality: N/A;  . LEFT HEART CATHETERIZATION WITH CORONARY ANGIOGRAM N/A 04/25/2014   Procedure: LEFT HEART CATHETERIZATION WITH CORONARY ANGIOGRAM;  Surgeon: Pamella Pert, MD;  Location: [facility] CATH LAB;  Service: Cardiovascular;  Laterality: N/A;   Social History   Social History Narrative  . No narrative on file     Objective: Vital Signs: BP 120/64   Pulse 60   Resp 16   Ht 5\' 8"  (1.727 m)   Wt 192 lb (87.1 kg)   BMI 29.19 kg/m    Physical Exam  Constitutional: He is oriented to person, place, and time. He appears well-developed and well-nourished.  HENT:  Head: Normocephalic and atraumatic.  Eyes: Pupils are equal, round, and reactive to light. Conjunctivae and EOM are normal.    Neck: Normal range of motion. Neck supple.  Cardiovascular: Normal rate, regular rhythm and normal heart sounds.   Pulmonary/Chest: Effort normal and breath sounds normal.  Abdominal: Soft. Bowel sounds are normal.  Neurological: He is alert and oriented to person, place, and time.  Skin: Skin is warm and dry. Capillary refill takes less than 2 seconds.  Psychiatric: He has a normal mood and affect. His behavior is normal.  Nursing note and vitals reviewed.    Musculoskeletal Exam: C-spine and thoracic lumbar spine good range of motion. He is some thoracic kyphosis. Shoulder joints elbow joints are good range of motion. He has limitation of range of motion of his wrist joints. He synovial thickening over her MCP joints. But no synovitis was noted. Hip joints knee joints ankles MTPs PIPs DIPs are good range of motion with no synovitis.s  CDAI Exam: CDAI Homunculus Exam:   Joint Counts:  CDAI Tender Joint count: 0 CDAI Swollen Joint count: 0  Global Assessments:  Patient Global Assessment: 4 Provider Global Assessment: 2  CDAI Calculated Score: 6    Investigation: Findings:  12/15/2016 TB gold negative   CBC Latest Ref Rng & Units 12/15/2016 09/17/2016 06/01/2014  WBC 3.8 - 10.8 K/uL 10.5 10.2 9.8  Hemoglobin 13.2 - 17.1 g/dL  12.5(L) 12.9(L) 11.3(L)  Hematocrit 38.5 - 50.0 % 38.5 40.1 34.7(L)  Platelets 140 - 400 K/uL 233 237 383.0   CMP Latest Ref Rng & Units 12/15/2016 09/17/2016 06/01/2014  Glucose 65 - 99 mg/dL 462(V) 035(K) 09(F)  BUN 7 - 25 mg/dL 13 12 15   Creatinine 0.70 - 1.33 mg/dL ) 8.18(E) 1.3  Sodium 135 - 146 mmol/L 139 139 137  Potassium 3.5 - 5.3 mmol/L 4.3 4.3 4.3  Chloride 98 - 110 mmol/L 105 104 103  CO2 20 - 31 mmol/L 23 27 26   Calcium 8.6 - 10.3 mg/dL 8.7 9.1 9.3  Total Protein 6.1 - 8.1 g/dL 6.4 6.6 -  Total Bilirubin 0.2 - 1.2 mg/dL 0.3 0.4 -  Alkaline Phos 40 - 115 U/L 76 85 -  AST 10 - 35 U/L 21 20 -  ALT 9 - 46 U/L 23 24 -    Imaging: Ct  Abdomen Pelvis W Contrast  Result Date: 03/12/2017 CLINICAL DATA:  Right lower quadrant pain and tenderness for 3 weeks. Abdominal bloating and weight gain. Diarrhea. Creatinine was obtained on site at [facility] at [address]Results: Creatinine 1.4 mg/dL. EXAM: CT ABDOMEN AND PELVIS WITH CONTRAST TECHNIQUE: Multidetector CT imaging of the abdomen and pelvis was performed using the standard protocol following bolus administration of intravenous contrast. CONTRAST:  05/12/2017 ISOVUE-300 IOPAMIDOL (ISOVUE-300) INJECTION 61% COMPARISON:  07/11/2004 FINDINGS: Lower Chest: No acute findings. Hepatobiliary: No hepatic masses identified. Gallbladder is unremarkable. Pancreas:  No mass or inflammatory changes. Spleen: Within normal limits in size and appearance. Adrenals/Urinary Tract: No masses identified. 2 mm nonobstructing calculus in upper pole of right kidney. No evidence of hydronephrosis or ureteral calculi. Unremarkable unopacified urinary bladder. Stomach/Bowel: No evidence of obstruction, inflammatory process or abnormal fluid collections. Normal appendix visualized. Moderate to large colonic stool burden not significantly changed compared to prior study. Vascular/Lymphatic: No pathologically enlarged lymph nodes. No abdominal aortic aneurysm. Aortic atherosclerosis. Reproductive:  No mass or other significant abnormality. Other:  None. Musculoskeletal: No suspicious bone lesions identified. Bilateral L5 pars defects incidentally noted, without associated spondylolisthesis. IMPRESSION: No evidence of appendicitis or other acute findings. Tiny nonobstructing right renal calculus. Aortic atherosclerosis. Electronically Signed   By: M.D.   On: 03/12/2017 16:47    Speciality Comments: No specialty comments available.    Procedures:  No procedures performed Allergies: Morphine and related   Assessment / Plan:     Visit Diagnoses: Rheumatoid arthritis involving multiple joints  (HCC) - Positive RF, positive anti-CCP, erosive disease with nodulosis. He is doing well on Enbrel. He has no synovitis. He gives history of intermittent swelling. I do not see any synovitis on examination today.  High risk medication use - Enbrel weekly. We will check his labs today and then every 3 months to monitor for drug toxicity.  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  Primary osteoarthritis of both knees: Minimal discomfort  History of coronary artery disease s/p CABG : Followed up by cardiology  History of chronic kidney disease - fu by Dr.Webb  History of diabetes mellitus: Fairly well controlled per patient  History of hypertension: His blood pressure is well controlled.  Tobacco abuse: Detailed counseling regarding smoking cessation was provided.  Dyslipidemia    Orders: Orders Placed This Encounter  Procedures  . CBC with Differential/Platelet  . COMPLETE METABOLIC PANEL WITH GFR   No orders of the defined types were placed in this encounter.   Face-to-face time spent with patient  was 30 minutes. Greater than 50% of time was spent in counseling and coordination of care.  Follow-Up Instructions: Return in about 5 months (around 08/30/2017).   Pollyann Savoy, MD  Note - This record has been created using Animal nutritionist.  Chart creation errors have been sought, but may not always  have been located. Such creation errors do not reflect on  the standard of medical care.

## 2017-03-26 DIAGNOSIS — E785 Hyperlipidemia, unspecified: Secondary | ICD-10-CM | POA: Insufficient documentation

## 2017-03-30 ENCOUNTER — Encounter: Payer: Self-pay | Admitting: Rheumatology

## 2017-03-30 ENCOUNTER — Ambulatory Visit (INDEPENDENT_AMBULATORY_CARE_PROVIDER_SITE_OTHER): Payer: 59 | Admitting: Rheumatology

## 2017-03-30 VITALS — BP 120/64 | HR 60 | Resp 16 | Ht 68.0 in | Wt 192.0 lb

## 2017-03-30 DIAGNOSIS — Z87448 Personal history of other diseases of urinary system: Secondary | ICD-10-CM | POA: Diagnosis not present

## 2017-03-30 DIAGNOSIS — M170 Bilateral primary osteoarthritis of knee: Secondary | ICD-10-CM

## 2017-03-30 DIAGNOSIS — Z720 Tobacco use: Secondary | ICD-10-CM

## 2017-03-30 DIAGNOSIS — E785 Hyperlipidemia, unspecified: Secondary | ICD-10-CM

## 2017-03-30 DIAGNOSIS — M0630 Rheumatoid nodule, unspecified site: Secondary | ICD-10-CM

## 2017-03-30 DIAGNOSIS — Z8639 Personal history of other endocrine, nutritional and metabolic disease: Secondary | ICD-10-CM | POA: Diagnosis not present

## 2017-03-30 DIAGNOSIS — M069 Rheumatoid arthritis, unspecified: Secondary | ICD-10-CM | POA: Diagnosis not present

## 2017-03-30 DIAGNOSIS — Z8679 Personal history of other diseases of the circulatory system: Secondary | ICD-10-CM

## 2017-03-30 DIAGNOSIS — Z79899 Other long term (current) drug therapy: Secondary | ICD-10-CM | POA: Diagnosis not present

## 2017-03-30 LAB — CBC WITH DIFFERENTIAL/PLATELET
Basophils Absolute: 128 cells/uL (ref 0–200)
Basophils Relative: 1 %
Eosinophils Absolute: 896 cells/uL — ABNORMAL HIGH (ref 15–500)
Eosinophils Relative: 7 %
HCT: 41.5 % (ref 38.5–50.0)
Hemoglobin: 13.5 g/dL (ref 13.2–17.1)
Lymphocytes Relative: 32 %
Lymphs Abs: 4096 cells/uL — ABNORMAL HIGH (ref 850–3900)
MCH: 30.6 pg (ref 27.0–33.0)
MCHC: 32.5 g/dL (ref 32.0–36.0)
MCV: 94.1 fL (ref 80.0–100.0)
MPV: 10.1 fL (ref 7.5–12.5)
Monocytes Absolute: 1024 cells/uL — ABNORMAL HIGH (ref 200–950)
Monocytes Relative: 8 %
Neutro Abs: 6656 cells/uL (ref 1500–7800)
Neutrophils Relative %: 52 %
Platelets: 242 10*3/uL (ref 140–400)
RBC: 4.41 MIL/uL (ref 4.20–5.80)
RDW: 14.1 % (ref 11.0–15.0)
WBC: 12.8 10*3/uL — ABNORMAL HIGH (ref 3.8–10.8)

## 2017-03-30 NOTE — Telephone Encounter (Signed)
Last Visit: 03/30/17 Next Visit: 09/09/17 Labs: 12/15/16 Creat 1.50 GFR 53 Previous Creat. 1.34 GFR 60 TB Gold: 12/15/16 Neg  Patient updated labs today  Okay to refill Enbrel?

## 2017-03-30 NOTE — Patient Instructions (Signed)
Standing Labs We placed an order today for your standing lab work.    Please come back and get your standing labs in November and every 3 months  We have open lab Monday through Friday from 8:30-11:30 AM and 1:30-4 PM at the office of Dr. Cameron Schwinn.   The office is located at [address], [address], [place], [place] [address] No appointment is necessary.   Labs are drawn by [facility].  You may receive a bill from [facility] for your lab work. If you have any questions regarding directions or hours of operation,  please call [phone].    

## 2017-03-30 NOTE — Telephone Encounter (Signed)
ok. Please fax results to his PCP

## 2017-03-31 LAB — COMPLETE METABOLIC PANEL WITH GFR
ALT: 27 U/L (ref 9–46)
AST: 22 U/L (ref 10–35)
Albumin: 4.2 g/dL (ref 3.6–5.1)
Alkaline Phosphatase: 91 U/L (ref 40–115)
BUN: 12 mg/dL (ref 7–25)
CO2: 25 mmol/L (ref 20–32)
Calcium: 8.9 mg/dL (ref 8.6–10.3)
Chloride: 103 mmol/L (ref 98–110)
Creat: 1.46 mg/dL — ABNORMAL HIGH (ref 0.70–1.33)
GFR, Est African American: 63 mL/min (ref >=60)
GFR, Est Non African American: 54 mL/min — ABNORMAL LOW (ref >=60)
Glucose, Bld: 68 mg/dL (ref 65–99)
Potassium: 4.4 mmol/L (ref 3.5–5.3)
Sodium: 139 mmol/L (ref 135–146)
Total Bilirubin: 0.4 mg/dL (ref 0.2–1.2)
Total Protein: 7.2 g/dL (ref 6.1–8.1)

## 2017-03-31 NOTE — Progress Notes (Signed)
Labs are stable.

## 2017-04-07 ENCOUNTER — Ambulatory Visit: Payer: Self-pay | Admitting: Neurology

## 2017-05-07 ENCOUNTER — Encounter: Payer: Self-pay | Admitting: Neurology

## 2017-05-07 ENCOUNTER — Ambulatory Visit (INDEPENDENT_AMBULATORY_CARE_PROVIDER_SITE_OTHER): Payer: 59 | Admitting: Neurology

## 2017-05-07 VITALS — BP 122/63 | HR 76 | Ht 68.0 in | Wt 186.0 lb

## 2017-05-07 DIAGNOSIS — G4733 Obstructive sleep apnea (adult) (pediatric): Secondary | ICD-10-CM

## 2017-05-07 NOTE — Progress Notes (Signed)
Subjective:    Patient ID: [name] is a 51 y.o. male.  HPI     Interim history:   [name] is a 51 year old right-handed gentleman with an underlying medical history of coronary artery disease with status post 5 vessel CABG in 2015, RA on Enbrel, diabetes type 1 (age 21), with retinopathy (s/p laser surgeries), chronic renal disease, asthma, smoking with prior cessation, hyperlipidemia, hypertension, and overweight state, who presents for follow-up consultation of his sleep disturbance after recent sleep study testing. The patient is unaccompanied today. I first met him on 02/05/2017 at the request of his primary care physician, at which time [name] reported snoring and excessive daytime somnolence. I suggested we proceed with sleep study testing. [name] had a baseline sleep study on 03/03/2017. I went over his test results with him in detail today. Sleep latency was delayed at 35.5 minutes, sleep efficiency 86.1%, REM latency 49.5 minutes which is mildly reduced. [name] had an increased percentage of stage II sleep, absence of slow-wave sleep and REM sleep was slightly increased at 28.5%. Total AHI was mildly borderline at 6 per hour, REM AHI of 14 per hour, supine AHI 7.2 per hour. Average oxygen saturation 91%, nadir was 84%. [name] had moderate PLMS with an index of 26.3 per hour, minimal arousals from PLMS with an index of 3 per hour. Based on his medical history and sleep related complaints I suggested CPAP therapy. Patient was more inclined to try an oral appliance.  Today, 05/07/2017: [name] reports increase in bleeding from taking ASA and Plavix, talked to Dr. Einar Gip about this, but [name] was told to just stop the beta blocker. No telltale RLS symptoms. [name] feels tired all the time. [name] has been off of the beta blocker for the past 3 weeks, no telltale changes yet. [name] has a fairly set schedule for his work at this time. [name] does wake up in the middle of the night. [name] is interested in pursuing a dental device as  his first-line treatment for sleep apnea but would not be opposed to trying CPAP if the need arises down the road. Unfortunately, [name] still smokes. [name] is wondering whether his bypasses have stenosis now. [name] did have a left heart cath in March 2017 which looked good per my review.  The patient's allergies, current medications, family history, past medical history, past social history, past surgical history and problem list were reviewed and updated as appropriate.   Previously (copied from previous notes for reference):   02/05/17: ([name]) reports snoring and excessive daytime somnolence. I reviewed your office note from 12/25/2016, which you kindly included. [name] is divorced, [name] has 3 daughters, ages 56, 62, and 66.[name] lives at home with his 75 year old daughter and his oldest daughter, as well as her 2 daughters. [name] works at the [employer]. [name] denies RLS Symptoms, no FHx of OSA.  [name] denies night to night nocturia. [name] currently smokes about 5 cigarettes per day and quit smoking at the time of his open heart surgery. [name] drinks alcohol rarely, drinks quite a bit of caffeine in the form of coffee 6-8 cups per day and diet green tea 3 bottles per day. [name] may average up to 2 bottles of water per day but typically not more than that. [name] denies painful peripheral neuropathy. Bedtime is around 9 or latest by 10 PM, wakeup time around 5 AM. [name] does have difficulty staying asleep. [name] denies any significant joint pain at this time, with the exception of occasional  right shoulder pain and hand pain.     His Past Medical History Is Significant For: Past Medical History:  Diagnosis Date  . Anginal pain ([place])   . Arthritis    RA IN HANDS  . Asthma   . CAD (coronary artery disease) 04/27/2014  . Chronic renal disease, stage 3, moderately decreased glomerular filtration rate between 30-59 mL/min/1.73 square meter (H[facility]) 05/03/2014  . Coronary artery disease   . Diabetes ([place]) 05/03/2014  . Diabetes mellitus without  complication ([place])    type 1  . Rheumatoid arthritis involving multiple joints ([place]) 05/03/2014  . Shortness of breath   . Unstable angina pectoris ([place]) 04/25/2014    His Past Surgical History Is Significant For: Past Surgical History:  Procedure Laterality Date  . CARDIAC CATHETERIZATION  04/25/2014   BY DR Einar Gip  . CARDIAC CATHETERIZATION N/A 10/30/2015   Procedure: Left Heart Cath and Cors/Grafts Angiography;  Surgeon: Adrian Prows, MD;  Location: [facility];  Service: Cardiovascular;  Laterality: N/A;  . CARPAL TUNNEL RELEASE    . CORONARY ARTERY BYPASS GRAFT N/A 04/27/2014   Procedure: CORONARY ARTERY BYPASS GRAFTING on pump using left internal mammary artery to LAD coronary artery, right great saphenous vein graft to diagonal coronary artery with sequential to OM1 and circumflex coronary arteries. Right greater saphenous vein graft to posterior descending coronary artery. ;  Surgeon: Grace Isaac, MD;  Location: [facility];  Service: Open Heart Surgery;  Laterality: N  . ENDOVEIN HARVEST OF GREATER SAPHENOUS VEIN Right 04/27/2014   Procedure: ENDOVEIN HARVEST OF GREATER SAPHENOUS VEIN;  Surgeon: Grace Isaac, MD;  Location: [facility];  Service: Open Heart Surgery;  Laterality: Right;  . EYE SURGERY     LASER  . INTRAOPERATIVE TRANSESOPHAGEAL ECHOCARDIOGRAM N/A 04/27/2014   Procedure: INTRAOPERATIVE TRANSESOPHAGEAL ECHOCARDIOGRAM;  Surgeon: Grace Isaac, MD;  Location: [facility];  Service: Open Heart Surgery;  Laterality: N/A;  . LEFT HEART CATHETERIZATION WITH CORONARY ANGIOGRAM N/A 04/25/2014   Procedure: LEFT HEART CATHETERIZATION WITH CORONARY ANGIOGRAM;  Surgeon: Laverda Page, MD;  Location: [facility];  Service: Cardiovascular;  Laterality: N/A;    His Family History Is Significant For: Family History  Problem Relation Age of Onset  . Prostate cancer Father   . Heart disease Father   . Stomach cancer Mother   . Heart disease Brother     His Social History Is  Significant For: Social History   Social History  . Marital status: Married    Spouse name: N/A  . Number of children: N/A  . Years of education: N/A   Social History Main Topics  . Smoking status: Current Every Day Smoker    Packs/day: 2.00    Years: 34.00    Types: Cigarettes    Last attempt to quit: 04/24/2014  . Smokeless tobacco: Never Used     Comment: currently using E cigarette  . Alcohol use 0.0 oz/week     Comment: RARE  . Drug use: No  . Sexual activity: Not Asked   Other Topics Concern  . None   Social History Narrative  . None    His Allergies Are:  Allergies  Allergen Reactions  . Morphine And Related Nausea And Vomiting  :   His Current Medications Are:  Outpatient Encounter Prescriptions as of 05/07/2017  Medication Sig  . albuterol (PROVENTIL HFA;VENTOLIN HFA) 108 (90 BASE) MCG/ACT inhaler Inhale 1-2 puffs into the lungs every 4 (four) hours as needed for wheezing or  shortness of breath.  Marland Kitchen amLODipine (NORVASC) 5 MG tablet Take 5 mg by mouth daily.   Marland Kitchen aspirin 81 MG tablet Take 81 mg by mouth daily.  Marland Kitchen buPROPion (WELLBUTRIN SR) 150 MG 12 hr tablet Take 75 mg by mouth daily.   . clopidogrel (PLAVIX) 75 MG tablet Take 75 mg by mouth daily with breakfast.   . ENBREL SURECLICK 50 MG/ML injection INJECT 50MG SUBCUTANEOUSLY EVERY WEEK  . Insulin Glargine (BASAGLAR KWIKPEN [place]) Inject into the skin. 18 units am 20 units pm  . insulin lispro (HUMALOG) 100 UNIT/ML injection Inject 4-17 Units into the skin See admin instructions. Inject 3-4 times daily with meals per sliding scale instructions on glucose meter: 1 unit for every 10 grams of carbs and CBG calculations  . nitroGLYCERIN (NITROSTAT) 0.4 MG SL tablet Place 0.4 mg under the tongue every 5 (five) minutes as needed for chest pain.   Marland Kitchen olmesartan (BENICAR) 5 MG tablet Take 5 mg by mouth daily.  . Probiotic Product (PROBIOTIC PO) Take 1 tablet by mouth daily.  . tadalafil (CIALIS) 20 MG tablet Take 20 mg by  mouth daily as needed for erectile dysfunction.  . [DISCONTINUED] insulin glargine (LANTUS) 100 UNIT/ML injection Inject 37 Units into the skin at bedtime.   . [DISCONTINUED] metoprolol (LOPRESSOR) 50 MG tablet Take 50 mg by mouth daily.  . [DISCONTINUED] rosuvastatin (CRESTOR) 10 MG tablet Take 10 mg by mouth daily.  . [DISCONTINUED] sildenafil (VIAGRA) 100 MG tablet Take 100 mg by mouth daily as needed for erectile dysfunction.  . [DISCONTINUED] valsartan (DIOVAN) 40 MG tablet Take 40 mg by mouth daily.    No facility-administered encounter medications on file as of 05/07/2017.   :  Review of Systems:  Out of a complete 14 point review of systems, all are reviewed and negative with the exception of these symptoms as listed below: Review of Systems  Neurological:       Pt presents today to discuss his sleep study results. Pt is having lots of side effects from medications prescribed by cardiology. Pt is having trouble with dry mouth at night.    Objective:  Neurological Exam  Physical Exam Physical Examination:   Vitals:   05/07/17 1259  BP: 122/63  Pulse: 76   General Examination: The patient is a very pleasant 51 y.o. male in no acute distress. [name] appears well-developed and well-nourished and well groomed.   HEENT: Normocephalic, atraumatic, pupils are equal, round and reactive to light and accommodation. Extraocular tracking is good without limitation to gaze excursion or nystagmus noted. Normal smooth pursuit is noted. Hearing is grossly intact. Face is symmetric with normal facial animation and normal facial sensation. Speech is clear with no dysarthria noted. There is no hypophonia. There is no lip, neck/head, jaw or voice tremor. Neck is supple with full range of passive and active motion. There are no carotid bruits on auscultation. Oropharynx exam reveals: mild mouth dryness, adequate dental hygiene and moderate airway crowding, due to smaller airway entry, large uvula, tonsils  are small and not fully visualized. Mallampati is class II. [name] has a minimal/mild overbite. Tongue protrudes centrally and palate elevates symmetrically. Good spirits.   Chest: Clear to auscultation without wheezing, rhonchi or crackles noted.  Heart: S1+S2+0, regular and normal without murmurs, rubs or gallops noted.   Abdomen: Soft, non-tender and non-distended with normal bowel sounds appreciated on auscultation.  Extremities: There is no pitting edema in the distal lower extremities bilaterally. Pedal pulses are intact.  Skin: Warm and dry without trophic changes noted. There are no varicose veins. S/p vein harvesting R leg.   Musculoskeletal: exam reveals no obvious joint deformities, tenderness or joint swelling or erythema.   Neurologically:  Mental status: The patient is awake, alert and oriented in all 4 spheres. His immediate and remote memory, attention, language skills and fund of knowledge are appropriate. There is no evidence of aphasia, agnosia, apraxia or anomia. Speech is clear with normal prosody and enunciation. Thought process is linear. Mood is normal and affect is normal.  Cranial nerves II - XII are as described above under HEENT exam. Motor exam: Normal bulk, strength and tone is noted. There is no drift, tremor or rebound. Reflexes are 2+ throughout. Fine motor skills and coordination: intact with normal finger taps, normal hand movements, normal rapid alternating patting, normal foot taps and normal foot agility.  Cerebellar testing: No dysmetria or intention tremor on finger to nose testing. There is no truncal or gait ataxia.  Sensory exam: intact to light touch in the upper and lower extremities.  Gait, station and balance: [name] stands easily. No veering to one side is noted. No leaning to one side is noted. Posture is age-appropriate and stance is narrow based. Gait shows normal stride length and normal pace. No problems turning are noted.   Assessment and  Plan:  In summary, [name] is a very pleasant 51 year old male with an underlying complex medical history of coronary artery disease with status post 5 vessel CABG in 2015, RA on Enbrel, diabetes type 1 (age 25), with retinopathy (s/p laser surgeries), chronic renal disease, asthma, smoking with prior cessation, hyperlipidemia, hypertension, and overweight state, who presents for follow-up consultation of his obstructive sleep apnea, after sleep study testing in July 2018. [name] has overall mild/borderline obstructive sleep apnea, more pronounced during REM sleep. Given his medical history and his sleep related complaints I suggested [name] pursue CPAP therapy. Alternatively, [name] can certainly pursue a dental device. [name] is encouraged to talk to his own dentist first and if needed we can make a referral to a local dentist practice for consideration of an oral appliance. [name] can certainly come back and see me on an as-needed basis and we can consider CPAP therapy or even AutoPap therapy in the future. Physical exam is stable. [name] is encouraged to quit smoking and pursue healthy lifestyle in general. We talked about risk factor reduction for cardiovascular disease with the help of sleep apnea treatment. Overall, his sleep apnea was rather mild which is reassuring. [name] had moderate PLMS without significant arousals and does not currently endorse restless leg symptoms, [name] can be monitored for this. I answered all his questions today and [name] was in agreement.  I spent 25 minutes in total face-to-face time with the patient, more than 50% of which was spent in counseling and coordination of care, reviewing test results, reviewing medication and discussing or reviewing the diagnosis of OSA, its prognosis and treatment options. Pertinent laboratory and imaging test results that were available during this visit with the patient were reviewed by me and considered in my medical decision making (see chart for details).

## 2017-05-07 NOTE — Patient Instructions (Addendum)
I appreciate you considering treatment for sleep apnea.   As discussed, your sleep study results from 03/03/17 indicated overall mild/borderline obstructive sleep apnea.  You may be treated with a dental device aka oral appliance. I would like for you to call your own dentist to see if they make these appliances. If needed I can make a referral to Dr. Irene Limbo or Dr. Althea Grimmer. Another bigger group is [facility]   We will send my records, and your sleep study results if needed.   Alternatively, we can try CPAP or even autoPAP.   I will see you back at this point on an as-needed basis. I would be happy to try with you CPAP therapy in the future if you choose to go that route.

## 2017-05-10 DIAGNOSIS — D8989 Other specified disorders involving the immune mechanism, not elsewhere classified: Secondary | ICD-10-CM | POA: Insufficient documentation

## 2017-05-11 DIAGNOSIS — E10621 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: Secondary | ICD-10-CM | POA: Insufficient documentation

## 2017-05-28 DIAGNOSIS — Z794 Long term (current) use of insulin: Secondary | ICD-10-CM | POA: Insufficient documentation

## 2017-07-16 NOTE — Progress Notes (Signed)
Office Visit Note  Patient: [name]             Date of Birth: 05-13-1966           MRN: [id number]             PCP: [name], MD Referring: [name], MD Visit Date: 07/30/2017 Occupation: @GUAROCC @    Subjective:  Other (doing okay)   History of Present Illness: [name] is a 51 y.o. male with history of rheumatoid arthritis and osteoarthritis overlap. He states his joints have been fairly well controlled as regards to joint pain and joint swelling. He has occasional discomfort in his left elbow. He had few episodes of hypotension and his blood pressure medications were adjusted. His blood pressure is better controlled now. He is also reduced his Plavix per patient due to increased bruising.  Activities of Daily Living:  Patient reports morning stiffness for 10 minutes.   Patient Denies nocturnal pain.  Difficulty dressing/grooming: Denies Difficulty climbing stairs: Denies Difficulty getting out of chair: Denies Difficulty using hands for taps, buttons, cutlery, and/or writing: Denies   Review of Systems  Constitutional: Negative for fatigue, night sweats and weakness ( ).  HENT: Positive for mouth dryness. Negative for mouth sores and nose dryness.   Eyes: Negative for redness and dryness.  Respiratory: Negative for shortness of breath and difficulty breathing.   Cardiovascular: Negative for chest pain, palpitations, hypertension, irregular heartbeat and swelling in legs/feet.  Gastrointestinal: Positive for constipation and diarrhea.  Endocrine: Negative for increased urination.  Musculoskeletal: Positive for morning stiffness. Negative for arthralgias, joint pain, joint swelling, myalgias, muscle weakness, muscle tenderness and myalgias.  Skin: Negative for color change, rash, hair loss, nodules/bumps, skin tightness, ulcers and sensitivity to sunlight.  Allergic/Immunologic: Negative for susceptible to infections.  Neurological: Negative  for dizziness, fainting, memory loss and night sweats.  Hematological: Positive for bruising/bleeding tendency. Negative for swollen glands.  Psychiatric/Behavioral: Negative for depressed mood and sleep disturbance. The patient is not nervous/anxious.     PMFS History:  Patient Active Problem List   Diagnosis Date Noted  . Dyslipidemia 03/26/2017  . High risk medication use 09/23/2016  . History of hypertension 09/23/2016  . History of chronic kidney disease 09/23/2016  . History of coronary artery disease 09/23/2016  . Tobacco abuse 09/23/2016  . Primary osteoarthritis of both knees 09/23/2016  . History of diabetes mellitus 09/23/2016  . 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 Chest pain with high risk for cardiac etiology 10/29/2015  . Asymptomatic bilateral carotid artery stenosis 06/08/2014  . Pleural effusion, bilateral 06/03/2014  . Dyspnea 06/01/2014  . Diabetes (HCC) 05/03/2014  . 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/03/2014  . 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 Chronic renal disease, stage 3, moderately decreased glomerular filtration rate between 30-59 mL/min/1.73 square meter (HCC) 05/03/2014  . CAD (coronary artery disease) 04/27/2014    Past Medical History:  Diagnosis Date  . Anginal pain (HCC)   . Arthritis    RA IN HANDS  . Asthma   . CAD (coronary artery disease) 04/27/2014  . Chronic renal disease, stage 3, moderately decreased glomerular filtration rate between 30-59 mL/min/1.73 square meter (HCC) 05/03/2014  . Coronary artery disease   . Diabetes (HCC) 05/03/2014  . Diabetes mellitus without complication (HCC)    type 1  . 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/03/2014  . Shortness of breath   . Unstable angina pectoris (HCC) 04/25/2014  Family History  Problem Relation Age of Onset  . Prostate cancer Father   . Heart disease Father   . Stomach cancer Mother   . Heart disease Brother   . Asthma Daughter   . Healthy  Daughter    Past Surgical History:  Procedure Laterality Date  . CARDIAC CATHETERIZATION  04/25/2014   BY DR Jacinto Halim  . CARDIAC CATHETERIZATION N/A 10/30/2015   Procedure: Left Heart Cath and Cors/Grafts Angiography;  Surgeon: Yates Decamp, MD;  Location[facility] INVASIVE CV LAB;  Service: Cardiovascular;  Laterality: N/A;  . CARPAL TUNNEL RELEASE    . CORONARY ARTERY BYPASS GRAFT N/A 04/27/2014   Procedure: CORONARY ARTERY BYPASS GRAFTING on pump using left internal mammary artery to LAD coronary artery, right great saphenous vein graft to diagonal coronary artery with sequential to OM1 and circumflex coronary arteries. Right greater saphenous vein graft to posterior descending coronary artery. ;  Surgeon: Delight Ovens, MD;  Location: [facility] OR;  Service: Open Heart Surgery;  Laterality: N  . ENDOVEIN HARVEST OF GREATER SAPHENOUS VEIN Right 04/27/2014   Procedure: ENDOVEIN HARVEST OF GREATER SAPHENOUS VEIN;  Surgeon: Delight Ovens, MD;  Location: [facility] OR;  Service: Open Heart Surgery;  Laterality: Right;  . EYE SURGERY     LASER  . INTRAOPERATIVE TRANSESOPHAGEAL ECHOCARDIOGRAM N/A 04/27/2014   Procedure: INTRAOPERATIVE TRANSESOPHAGEAL ECHOCARDIOGRAM;  Surgeon: Delight Ovens, MD;  Location: [facility] OR;  Service: Open Heart Surgery;  Laterality: N/A;  . LEFT HEART CATHETERIZATION WITH CORONARY ANGIOGRAM N/A 04/25/2014   Procedure: LEFT HEART CATHETERIZATION WITH CORONARY ANGIOGRAM;  Surgeon: Pamella Pert, MD;  Location: [facility] CATH LAB;  Service: Cardiovascular;  Laterality: N/A;   Social History   Social History Narrative  . Not on file     Objective: Vital Signs: BP 122/72 (BP Location: Left Arm, Patient Position: Sitting, Cuff Size: Normal)   Pulse 64   Resp 16   Ht 5\' 8"  (1.727 m)   Wt 183 lb (83 kg)   BMI 27.83 kg/m    Physical Exam  Constitutional: He is oriented to person, place, and time. He appears well-developed and well-nourished.  HENT:  Head: Normocephalic and atraumatic.    Eyes: Conjunctivae and EOM are normal. Pupils are equal, round, and reactive to light.  Neck: Normal range of motion. Neck supple.  Cardiovascular: Normal rate, regular rhythm and normal heart sounds.  Pulmonary/Chest: Effort normal and breath sounds normal.  Abdominal: Soft. Bowel sounds are normal.  Neurological: He is alert and oriented to person, place, and time.  Skin: Skin is warm and dry. Capillary refill takes less than 2 seconds.  Psychiatric: He has a normal mood and affect. His behavior is normal.  Nursing note and vitals reviewed.    Musculoskeletal Exam: C-spine limitation in range of motion shoulder joints although joints with good range of motion. He has some limitation in range of motion of his wrist joints with synovial thickening with no synovitis was noted. No MCP swelling or synovitis was noted. He has DIP PIP thickening in his hands. Hip joints, knee joints, ankle joints are good range of motion. No tenderness across the MTPs was noted.  CDAI Exam: CDAI Homunculus Exam:   Joint Counts:  CDAI Tender Joint count: 0 CDAI Swollen Joint count: 0  Global Assessments:  Patient Global Assessment: 3 Provider Global Assessment: 3  CDAI Calculated Score: 6    Investigation: No additional findings.TB Gold: 12/15/2016 Negative  CBC Latest Ref Rng & Units 03/30/2017 12/15/2016 09/17/2016  WBC 3.8 - 10.8 K/uL 12.8(H) 10.5 10.2  Hemoglobin 13.2 - 17.1 g/dL 65.5 12.5(L) 12.9(L)  Hematocrit 38.5 - 50.0 % 41.5 38.5 40.1  Platelets 140 - 400 K/uL 242 233 237   CMP Latest Ref Rng & Units 03/30/2017 12/15/2016 09/17/2016  Glucose 65 - 99 mg/dL 68 374(M) 270(B)  BUN 7 - 25 mg/dL 12 13 12   Creatinine 0.70 - 1.33 mg/dL ) 8.67(J) 4.49(E)  Sodium 135 - 146 mmol/L 139 139 139  Potassium 3.5 - 5.3 mmol/L 4.4 4.3 4.3  Chloride 98 - 110 mmol/L 103 105 104  CO2 20 - 32 mmol/L 25 23 27   Calcium 8.6 - 10.3 mg/dL 8.9 8.7 9.1  Total Protein 6.1 - 8.1 g/dL 7.2 6.4 6.6  Total Bilirubin  0.2 - 1.2 mg/dL 0.4 0.3 0.4  Alkaline Phos 40 - 115 U/L 91 76 85  AST 10 - 35 U/L 22 21 20   ALT 9 - 46 U/L 27 23 24     Imaging: No results found.  Speciality Comments: No specialty comments available.    Procedures:  No procedures performed Allergies: Morphine and related   Assessment / Plan:     Visit Diagnoses: Rheumatoid arthritis involving multiple sites with positive rheumatoid factor (HCC) - Positive RF, positive anti-CCP, erosive disease with nodulosis. He is doing really well on Enbrel. He at no synovitis on examination today. He has some synovial thickening. He is tolerating medication well. He states he gets occasional olecrenon bursitis.  High risk medication use - Enbrel weekly.  - Plan: QuantiFERON-TB Gold Plus with labs in June 2019. I will check his labs today and then every 3 months to monitor for drug toxicity.  Rheumatoid nodulosis (HCC): Most of his nodules have resolved.  Primary osteoarthritis of both knees: He did well after the visco supplement injections.  History of chronic kidney disease - fu by Dr.Webb  History of coronary artery disease - s/p CABG : Followed up by cardiology  Tobacco abuse: Smoking cessation was discussed.  History of hypertension: Blood pressure is well controlled.  History of diabetes mellitus  Dyslipidemia    Orders: Orders Placed This Encounter  Procedures  . QuantiFERON-TB Gold Plus   No orders of the defined types were placed in this encounter.    Follow-Up Instructions: Return in about 5 months (around 12/28/2017) for Rheumatoid arthritis.   , MD  Note - This record has been created using .  Chart creation errors have been sought, but may not always  have been located. Such creation errors do not reflect on  the standard of medical care.

## 2017-07-24 ENCOUNTER — Other Ambulatory Visit: Payer: Self-pay | Admitting: Rheumatology

## 2017-07-24 NOTE — Telephone Encounter (Signed)
Last Visit : 03/30/17 Next Visit: 07/30/17 Labs: 03/30/17 stable TB Gold: 12/15/16 Negative  Okay to refill per Dr. Corliss Skains

## 2017-07-30 ENCOUNTER — Encounter: Payer: Self-pay | Admitting: Rheumatology

## 2017-07-30 ENCOUNTER — Ambulatory Visit: Payer: 59 | Admitting: Rheumatology

## 2017-07-30 VITALS — BP 122/72 | HR 64 | Resp 16 | Ht 68.0 in | Wt 183.0 lb

## 2017-07-30 DIAGNOSIS — M0579 Rheumatoid arthritis with rheumatoid factor of multiple sites without organ or systems involvement: Secondary | ICD-10-CM

## 2017-07-30 DIAGNOSIS — Z87448 Personal history of other diseases of urinary system: Secondary | ICD-10-CM | POA: Diagnosis not present

## 2017-07-30 DIAGNOSIS — Z8639 Personal history of other endocrine, nutritional and metabolic disease: Secondary | ICD-10-CM

## 2017-07-30 DIAGNOSIS — Z79899 Other long term (current) drug therapy: Secondary | ICD-10-CM | POA: Diagnosis not present

## 2017-07-30 DIAGNOSIS — M0630 Rheumatoid nodule, unspecified site: Secondary | ICD-10-CM | POA: Diagnosis not present

## 2017-07-30 DIAGNOSIS — Z720 Tobacco use: Secondary | ICD-10-CM

## 2017-07-30 DIAGNOSIS — E785 Hyperlipidemia, unspecified: Secondary | ICD-10-CM | POA: Diagnosis not present

## 2017-07-30 DIAGNOSIS — Z8679 Personal history of other diseases of the circulatory system: Secondary | ICD-10-CM | POA: Diagnosis not present

## 2017-07-30 DIAGNOSIS — M170 Bilateral primary osteoarthritis of knee: Secondary | ICD-10-CM

## 2017-07-30 LAB — COMPLETE METABOLIC PANEL WITH GFR
AG Ratio: 1.4 (calc) (ref 1.0–2.5)
ALT: 22 U/L (ref 9–46)
AST: 20 U/L (ref 10–35)
Albumin: 4.2 g/dL (ref 3.6–5.1)
Alkaline phosphatase (APISO): 98 U/L (ref 40–115)
BUN/Creatinine Ratio: 7 (calc) (ref 6–22)
BUN: 11 mg/dL (ref 7–25)
CO2: 29 mmol/L (ref 20–32)
Calcium: 9.3 mg/dL (ref 8.6–10.3)
Chloride: 102 mmol/L (ref 98–110)
Creat: 1.49 mg/dL — ABNORMAL HIGH (ref 0.70–1.33)
GFR, Est African American: 61 mL/min/{1.73_m2} (ref >=60)
GFR, Est Non African American: 53 mL/min/{1.73_m2} — ABNORMAL LOW (ref >=60)
Globulin: 3 g/dL (calc) (ref 1.9–3.7)
Glucose, Bld: 165 mg/dL — ABNORMAL HIGH (ref 65–99)
Potassium: 4.4 mmol/L (ref 3.5–5.3)
Sodium: 138 mmol/L (ref 135–146)
Total Bilirubin: 0.5 mg/dL (ref 0.2–1.2)
Total Protein: 7.2 g/dL (ref 6.1–8.1)

## 2017-07-30 LAB — CBC WITH DIFFERENTIAL/PLATELET
Basophils Absolute: 155 cells/uL (ref 0–200)
Basophils Relative: 1.3 %
Eosinophils Absolute: 1035 cells/uL — ABNORMAL HIGH (ref 15–500)
Eosinophils Relative: 8.7 %
HCT: 42.6 % (ref 38.5–50.0)
Hemoglobin: 14.4 g/dL (ref 13.2–17.1)
Lymphs Abs: 4510 cells/uL — ABNORMAL HIGH (ref 850–3900)
MCH: 31 pg (ref 27.0–33.0)
MCHC: 33.8 g/dL (ref 32.0–36.0)
MCV: 91.8 fL (ref 80.0–100.0)
MPV: 10.7 fL (ref 7.5–12.5)
Monocytes Relative: 6.1 %
Neutro Abs: 5474 cells/uL (ref 1500–7800)
Neutrophils Relative %: 46 %
Platelets: 262 10*3/uL (ref 140–400)
RBC: 4.64 10*6/uL (ref 4.20–5.80)
RDW: 12.9 % (ref 11.0–15.0)
Total Lymphocyte: 37.9 %
WBC mixed population: 726 cells/uL (ref 200–950)
WBC: 11.9 10*3/uL — ABNORMAL HIGH (ref 3.8–10.8)

## 2017-07-30 MED ORDER — ETANERCEPT 50 MG/ML ~~LOC~~ SOAJ: SUBCUTANEOUS | 0 refills | Status: DC

## 2017-07-30 NOTE — Patient Instructions (Signed)
Standing Labs We placed an order today for your standing lab work.    Please come back and get your standing labs in March and every 3 months  We have open lab Monday through Friday from 8:30-11:30 AM and 1:30-4 PM at the office of Dr. Kennedi Lizardo.   The office is located at [address], [address], [place], [place] [address] No appointment is necessary.   Labs are drawn by [facility].  You may receive a bill from [facility] for your lab work. If you have any questions regarding directions or hours of operation,  please call [phone].    

## 2017-07-31 NOTE — Progress Notes (Signed)
Labs are stable. GFR and 50s. Please forward labs to his PCP and nephrologist.

## 2017-08-11 ENCOUNTER — Other Ambulatory Visit: Payer: Self-pay

## 2017-08-11 DIAGNOSIS — I739 Peripheral vascular disease, unspecified: Secondary | ICD-10-CM

## 2017-08-14 ENCOUNTER — Ambulatory Visit (HOSPITAL_COMMUNITY)
Admission: RE | Admit: 2017-08-14 | Discharge: 2017-08-14 | Disposition: A | Discharge status: Home / Self Care | Payer: 59 | Source: Ambulatory Visit | Attending: Vascular Surgery | Admitting: Vascular Surgery

## 2017-08-14 ENCOUNTER — Encounter: Payer: Self-pay | Admitting: Vascular Surgery

## 2017-08-14 ENCOUNTER — Ambulatory Visit: Payer: 59 | Admitting: Vascular Surgery

## 2017-08-14 VITALS — BP 108/58 | HR 74 | Temp 98.2°F | Resp 20 | Ht 68.0 in | Wt 185.0 lb

## 2017-08-14 DIAGNOSIS — I739 Peripheral vascular disease, unspecified: Secondary | ICD-10-CM | POA: Diagnosis not present

## 2017-08-14 DIAGNOSIS — E1051 Type 1 diabetes mellitus with diabetic peripheral angiopathy without gangrene: Secondary | ICD-10-CM | POA: Diagnosis not present

## 2017-08-14 DIAGNOSIS — R9439 Abnormal result of other cardiovascular function study: Secondary | ICD-10-CM | POA: Insufficient documentation

## 2017-08-14 NOTE — Progress Notes (Signed)
Requested by:  [name], MD [address] [place], [place] [address]  Reason for consultation: left foot wound    History of Present Illness   [name] is a 52 y.o. (1966-05-01) male IDDM s/p CABG x 5v with bilateral vein harvest who presents with chief complaint: bilateral foot swelling and hyperpigmentation in feet.  Patient notes, onset of swelling months ago, associated with starting Diltazem.  The patient notice increased feet swelling and hyperpigmentation in his feet that developed with swelling.  He also developed a ulcer on his left foot. Diltiazem was disconnected by Nephrology and his swelling resolved.  As the swelling resolved, the left foot ulcer healed.    The patient has had no history of DVT, no history of varicose vein, no history of venous stasis ulcers, no history of  Lymphedema and known history of skin changes in lower legs.  There is known family history of venous disorders.  The patient has used compression stockings in the past.  Past Medical History:  Diagnosis Date  . Anginal pain (HCC)   . Arthritis    RA IN HANDS  . Asthma   . CAD (coronary artery disease) 04/27/2014  . Chronic renal disease, stage 3, moderately decreased glomerular filtration rate between 30-59 mL/min/1.73 square meter (HCC) 05/03/2014  . Coronary artery disease   . Diabetes (HCC) 05/03/2014  . Diabetes mellitus without complication (HCC)    type 1  . 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/03/2014  . Shortness of breath   . Unstable angina pectoris (HCC) 04/25/2014    Past Surgical History:  Procedure Laterality Date  . CARDIAC CATHETERIZATION  04/25/2014   BY DR Jacinto Halim  . CARDIAC CATHETERIZATION N/A 10/30/2015   Procedure: Left Heart Cath and Cors/Grafts Angiography;  Surgeon: Yates Decamp, MD;  Location: [facility] INVASIVE CV LAB;  Service: Cardiovascular;  Laterality: N/A;  . CARPAL TUNNEL RELEASE    . CORONARY ARTERY BYPASS GRAFT N/A 04/27/2014   Procedure:  CORONARY ARTERY BYPASS GRAFTING on pump using left internal mammary artery to LAD coronary artery, right great saphenous vein graft to diagonal coronary artery with sequential to OM1 and circumflex coronary arteries. Right greater saphenous vein graft to posterior descending coronary artery. ;  Surgeon: Delight Ovens, MD;  Location: [facility] OR;  Service: Open Heart Surgery;  Laterality: N  . ENDOVEIN HARVEST OF GREATER SAPHENOUS VEIN Right 04/27/2014   Procedure: ENDOVEIN HARVEST OF GREATER SAPHENOUS VEIN;  Surgeon: Delight Ovens, MD;  Location: [facility] OR;  Service: Open Heart Surgery;  Laterality: Right;  . EYE SURGERY     LASER  . INTRAOPERATIVE TRANSESOPHAGEAL ECHOCARDIOGRAM N/A 04/27/2014   Procedure: INTRAOPERATIVE TRANSESOPHAGEAL ECHOCARDIOGRAM;  Surgeon: Delight Ovens, MD;  Location: [facility] OR;  Service: Open Heart Surgery;  Laterality: N/A;  . LEFT HEART CATHETERIZATION WITH CORONARY ANGIOGRAM N/A 04/25/2014   Procedure: LEFT HEART CATHETERIZATION WITH CORONARY ANGIOGRAM;  Surgeon: Pamella Pert, MD;  Location: [facility] CATH LAB;  Service: Cardiovascular;  Laterality: N/A;    Social History   Socioeconomic History  . Marital status: Married    Spouse name: Not on file  . Number of children: Not on file  . Years of education: Not on file  . Highest education level: Not on file  Social Needs  . Financial resource strain: Not on file  . Food insecurity - worry: Not on file  . Food insecurity - inability: Not on file  . Transportation needs - medical: Not on file  .  Transportation needs - non-medical: Not on file  Occupational History  . Not on file  Tobacco Use  . Smoking status: Current Every Day Smoker    Years: 34.00    Types: Cigarettes  . Smokeless tobacco: Never Used  . Tobacco comment: 10 cigarettes per day  Substance and Sexual Activity  . Alcohol use: Yes    Alcohol/week: 0.0 oz    Comment: RARE  . Drug use: No  . Sexual activity: Not on file  Other Topics Concern  .  Not on file  Social History Narrative  . Not on file    Family History  Problem Relation Age of Onset  . Prostate cancer Father   . Heart disease Father   . Stomach cancer Mother   . Heart disease Brother   . Asthma Daughter   . Healthy Daughter     Current Outpatient Medications  Medication Sig Dispense Refill  . albuterol (PROVENTIL HFA;VENTOLIN HFA) 108 (90 BASE) MCG/ACT inhaler Inhale 1-2 puffs into the lungs every 4 (four) hours as needed for wheezing or shortness of breath.    Marland Kitchen amLODipine (NORVASC) 5 MG tablet Take 5 mg by mouth daily.     Marland Kitchen aspirin 81 MG tablet Take 81 mg by mouth daily.    Marland Kitchen buPROPion (WELLBUTRIN SR) 150 MG 12 hr tablet Take 75 mg by mouth daily.     . clopidogrel (PLAVIX) 75 MG tablet Take 75 mg by mouth daily with breakfast. Takes 1/2 tab on Mon and 1/2 tab on Thurs in the am.    . etanercept (ENBREL SURECLICK) 50 MG/ML injection INJECT 50MG  SUBCUTANEOUSLY EVERY WEEK 12 Syringe 0  . Insulin Glargine (BASAGLAR KWIKPEN [place]) Inject into the skin. 18 units am 20 units pm    . insulin lispro (HUMALOG) 100 UNIT/ML injection Inject 4-17 Units into the skin See admin instructions. Inject 3-4 times daily with meals per sliding scale instructions on glucose meter: 1 unit for every 10 grams of carbs and CBG calculations    . magnesium citrate SOLN Take 1 Bottle by mouth as needed for severe constipation.    . metoprolol tartrate (LOPRESSOR) 50 MG tablet Take by mouth daily. Take 1/2 tab daily    . nitroGLYCERIN (NITROSTAT) 0.4 MG SL tablet Place 0.4 mg under the tongue every 5 (five) minutes as needed for chest pain.   0  . olmesartan (BENICAR) 5 MG tablet Take 5 mg by mouth daily.  0  . Probiotic Product (PROBIOTIC PO) Take 1 tablet by mouth daily.    . tadalafil (CIALIS) 20 MG tablet Take 20 mg by mouth daily as needed for erectile dysfunction.     No current facility-administered medications for this visit.     Allergies  Allergen Reactions  . Morphine And  Related Nausea And Vomiting    REVIEW OF SYSTEMS (negative unless checked):   Cardiac:  []  Chest pain or chest pressure? [x]  Shortness of breath upon activity? []  Shortness of breath when lying flat? []  Irregular heart rhythm?  Vascular:  []  Pain in calf, thigh, or hip brought on by walking? []  Pain in feet at night that wakes you up from your sleep? []  Blood clot in your veins? [x]  Leg swelling?  Pulmonary:  []  Oxygen at home? []  Productive cough? [x]  Wheezing?  Neurologic:  []  Sudden weakness in arms or legs? []  Sudden numbness in arms or legs? []  Sudden onset of difficult speaking or slurred speech? []  Temporary loss of vision in one eye? []   Problems with dizziness?  Gastrointestinal:  []  Blood in stool? []  Vomited blood?  Genitourinary:  []  Burning when urinating? []  Blood in urine?  Psychiatric:  []  Major depression  Hematologic:  []  Bleeding problems? []  Problems with blood clotting?  Dermatologic:  [x]  Rashes or ulcers?  Constitutional:  []  Fever or chills?  Ear/Nose/Throat:  []  Change in hearing? []  Nose bleeds? []  Sore throat?  Musculoskeletal:  []  Back pain? []  Joint pain? []  Muscle pain?   Physical Examination     Vitals:   08/14/17 1352  BP: (!) 108/58  Pulse: 74  Resp: 20  Temp: 98.2 F (36.8 C)  TempSrc: Oral  SpO2: 98%  Weight: 185 lb (83.9 kg)  Height: 5\' 8"  (1.727 m)   Body mass index is 28.13 kg/m.  General Alert, O x 3, WD, NAD  Head Convoy/AT,    Ear/Nose/ Throat Hearing grossly intact, nares without erythema or drainage, oropharynx without Erythema or Exudate, Mallampati score: 3,   Eyes PERRLA, EOMI,    Neck Supple, mid-line trachea,    Pulmonary Sym exp, good B air movt, CTA B  Cardiac RRR, Nl S1, S2, no Murmurs, No rubs, No S3,S4  Vascular Vessel Right Left  Radial Palpable Palpable  Brachial Palpable Palpable  Carotid Palpable, No Bruit Palpable, No Bruit  Aorta Not palpable N/A  Femoral Palpable Palpable   Popliteal Not palpable Not palpable  PT Not palpable Palpable  DP Palpable Palpable    Gastro- intestinal soft, non-distended, non-tender to palpation, No guarding or rebound, no HSM, no masses, no CVAT B, No palpable prominent aortic pulse,    Musculo- skeletal M/S 5/5 throughout  , Extremities without ischemic changes  , No edema present, No visible varicosities , Lipodermatosclerosis present: B lower leg, prior healed ulcer visible in medial foot, prior vein harvest incisions noted  Neurologic Cranial nerves 2-12 intact , Pain and light touch intact in extremities , Motor exam as listed above  Psychiatric Judgement intact, Mood & affect appropriate for pt's clinical situation  Dermatologic See M/S exam for extremity exam, No rashes otherwise noted  Lymphatic  Palpable lymph nodes: None    Non-invasive Vascular Imaging   ABI (08/14/2017)  R:   ABI: 1.4,   PT: mono  DP: tri  TBI:  0.56  L:   ABI: 1.24,   PT: bi  DP: tri  TBI: 0.43   Outside Studies/Documentation   10 pages of outside documents were reviewed including: outpatient PCP charts.   Medical Decision Making   JAIDEEP POLLACK is a 52 y.o. 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  Additionally his TBI on the left is abnormal.  However, he has a completely normal DP in both legs.  Taken altogether, this patient functional has minimal PAD.  Based on the patient's history and examination, I recommend: annual BLE ABI.  Patient is already using compression stockings.  His examination is consistent with chronic venous insufficiency involving the deep venous system, for which there is no intervention.  EVLA B GSV is not necessary as his GSV has been partially harvest on both sides.  If he develops increasing CVI sx, might consider B venous reflux duplex to see if pt has a  duplicated GSV system.  Thank you for allowing to participate in this patient's care.   , MD, FACS [facility] Office: [phone] Pager: [phone]  08/14/2017, 2:19 PM

## 2017-08-17 NOTE — Addendum Note (Signed)
Addended by: Burton Apley A on: 08/17/2017 02:23 PM   Modules accepted: Orders

## 2017-08-18 DIAGNOSIS — D72820 Lymphocytosis (symptomatic): Secondary | ICD-10-CM | POA: Insufficient documentation

## 2017-08-24 ENCOUNTER — Telehealth: Payer: Self-pay | Admitting: Hematology

## 2017-08-24 NOTE — Telephone Encounter (Signed)
I spoke with patient regarding appointment D/T/Loc/Ph#

## 2017-09-07 NOTE — Progress Notes (Signed)
HEMATOLOGY ONCOLOGY CONSULT NOTE  DOS .09/09/2017  Patient Care Team: [name], MD as PCP - General (Internal Medicine) Yates Decamp, MD as Consulting Physician (Cardiology) Pollyann Savoy, MD as Consulting Physician (Rheumatology) Elvis Coil, MD as Consulting Physician (Nephrology)  CHIEF COMPLAINTS/PURPOSE OF CONSULTATION:  Lymphocytosis  HISTORY OF PRESENTING ILLNESS:   [name] 52 y.o. male is here because of a referral from Dr. [name] regarding lymphocytosis. The pt reports that he is doing well overall. He takes Enbrel for his rheumatoid arthritis since 2008 with relief. He used to take methotrexate before taking Enbrel.   He received open heart surgery for CAD after a cardiac catheterization.  He notes that he smokes half a pack of cigarettes each day.  He has had several blood vessels taken out for his open heart surgery.   He sees Dr. Hyman Hopes regarding his kidneys, and reported having venous stasis for which he wears compression socks.  Recently, he notes feeling fatigued that has increased over the last year. 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  He notes that he has COPD, accompanied by a little SOB for which he uses an inhaler.   He denies any recent rheumatoid arthritis flare ups. He notes that he feels stiff in the mornings for up to 15-20 minutes.  Regarding his DM type 1, he denies neuropathy but has had laser therapy for his retinopathy.  He notes that after his open heart surgery and taking more medication, he has been sweating at night.  The pt denies going out of the country for any length of time recently or being exposed to any unsafe water. He notes some distension of his abdomen and constipation and received a colonoscopy and CT abd in August 2018 which didn't reveal anything significant. He notes not wanting to drink lots of water to alleviate his constipation and takes miralax with some  relief.  He notes that he has had adult back and chest acne which he f/u with his PCP.   Most recent lab results on 07/30/17 of CBC reveal WBC at 11.9, Lymph Abs at 4510 and Esosinophils Abs at 1035, and Monocytes Abs were not tested. Five months ago his WBC were at 12.8, Lymph Abs were at 4096, Eosinophils Abs at 896, and Monocytes Abs were elevated at 1024. Eight months ago his WBC were WNL at 10.5, Lymph Abs WNL at 3465, Eosinophils Abs were elevated at 735, and his Monocytes Abs were WNL at 525.  On review of systems, pt reports fatigue, little SOB, night sweats over the last 2 years, an intermittently swollen abdomen, increased mucous production,  and denies sudden weight loss, fevers, chills, pain along the spine, back pain, trouble passing urine, lower abdomen pain, testicular pain.   On PMHx the pt has rheumatoid arthritis, CAD, Type 1 DM, Chronic Renal Disease, and COPD. On FHx his father had prostate cancer, mother had stomach cancer, all 3 of dad's siblings died of cancer. Mother's 2 siblings all died of cancer too. On Social Hx he notes no drinking. He smokes a half pack of cigarettes per day.    MEDICAL HISTORY:  Past Medical History:  Diagnosis Date  . Anginal pain (HCC)   . Arthritis    RA IN HANDS  . Asthma   . CAD (coronary artery disease) 04/27/2014  . Chronic renal disease, stage 3, moderately decreased glomerular filtration rate between 30-59 mL/min/1.73 square meter (HCC) 05/03/2014  . Coronary  artery disease   . Diabetes (HCC) 05/03/2014  . Diabetes mellitus without complication (HCC)    type 1  . 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/03/2014  . Shortness of breath   . Unstable angina pectoris (HCC) 04/25/2014    SURGICAL HISTORY: Past Surgical History:  Procedure Laterality Date  . CARDIAC CATHETERIZATION  04/25/2014   BY DR Jacinto Halim  . CARDIAC CATHETERIZATION N/A 10/30/2015   Procedure: Left Heart Cath and Cors/Grafts Angiography;  Surgeon: Yates Decamp, MD;  Location: [facility] INVASIVE CV LAB;  Service: Cardiovascular;  Laterality: N/A;  . CARPAL TUNNEL RELEASE    . CORONARY ARTERY BYPASS GRAFT N/A 04/27/2014   Procedure: CORONARY ARTERY BYPASS GRAFTING on pump using left internal mammary artery to LAD coronary artery, right great saphenous vein graft to diagonal coronary artery with sequential to OM1 and circumflex coronary arteries. Right greater saphenous vein graft to posterior descending coronary artery. ;  Surgeon: Delight Ovens, MD;  Location: [facility] OR;  Service: Open Heart Surgery;  Laterality: N  . ENDOVEIN HARVEST OF GREATER SAPHENOUS VEIN Right 04/27/2014   Procedure: ENDOVEIN HARVEST OF GREATER SAPHENOUS VEIN;  Surgeon: Delight Ovens, MD;  Location: [facility] OR;  Service: Open Heart Surgery;  Laterality: Right;  . EYE SURGERY     LASER  . INTRAOPERATIVE TRANSESOPHAGEAL ECHOCARDIOGRAM N/A 04/27/2014   Procedure: INTRAOPERATIVE TRANSESOPHAGEAL ECHOCARDIOGRAM;  Surgeon: Delight Ovens, MD;  Location: [facility] OR;  Service: Open Heart Surgery;  Laterality: N/A;  . LEFT HEART CATHETERIZATION WITH CORONARY ANGIOGRAM N/A 04/25/2014   Procedure: LEFT HEART CATHETERIZATION WITH CORONARY ANGIOGRAM;  Surgeon: Pamella Pert, MD;  Location: [facility] CATH LAB;  Service: Cardiovascular;  Laterality: N/A;    SOCIAL HISTORY: Social History   Socioeconomic History  . Marital status: Married    Spouse name: Not on file  . Number of children: Not on file  . Years of education: Not on file  . Highest education level: Not on file  Social Needs  . Financial resource strain: Not on file  . Food insecurity - worry: Not on file  . Food insecurity - inability: Not on file  . Transportation needs - medical: Not on file  . Transportation needs - non-medical: Not on file  Occupational History  . Not on file  Tobacco Use  . Smoking status: Current Every Day Smoker    Years: 34.00    Types: Cigarettes  . Smokeless tobacco: Never Used  . Tobacco comment: 10  cigarettes per day  Substance and Sexual Activity  . Alcohol use: Yes    Alcohol/week: 0.0 oz    Comment: RARE  . Drug use: No  . Sexual activity: Not on file  Other Topics Concern  . Not on file  Social History Narrative  . Not on file    FAMILY HISTORY: Family History  Problem Relation Age of Onset  . Prostate cancer Father   . Heart disease Father   . Stomach cancer Mother   . Heart disease Brother   . Asthma Daughter   . Healthy Daughter     ALLERGIES:  is allergic to morphine and related.  MEDICATIONS:  Current Outpatient Medications  Medication Sig Dispense Refill  . albuterol (PROVENTIL HFA;VENTOLIN HFA) 108 (90 BASE) MCG/ACT inhaler Inhale 1-2 puffs into the lungs every 4 (four) hours as needed for wheezing or shortness of breath.    Marland Kitchen amLODipine (NORVASC) 5 MG tablet Take 5 mg by mouth daily.     Marland Kitchen aspirin 81  MG tablet Take 81 mg by mouth daily.    Marland Kitchen buPROPion (WELLBUTRIN SR) 150 MG 12 hr tablet Take 75 mg by mouth daily.     . clopidogrel (PLAVIX) 75 MG tablet Take 75 mg by mouth daily with breakfast. Takes 1/2 tab on Mon and 1/2 tab on Thurs in the am.    . etanercept (ENBREL SURECLICK) 50 MG/ML injection INJECT 50MG  SUBCUTANEOUSLY EVERY WEEK 12 Syringe 0  . Insulin Glargine (BASAGLAR KWIKPEN [place]) Inject into the skin. 18 units am 20 units pm    . insulin lispro (HUMALOG) 100 UNIT/ML injection Inject 4-17 Units into the skin See admin instructions. Inject 3-4 times daily with meals per sliding scale instructions on glucose meter: 1 unit for every 10 grams of carbs and CBG calculations    . magnesium citrate SOLN Take 1 Bottle by mouth as needed for severe constipation.    . metoprolol tartrate (LOPRESSOR) 50 MG tablet Take by mouth daily. Take 1/2 tab daily    . nitroGLYCERIN (NITROSTAT) 0.4 MG SL tablet Place 0.4 mg under the tongue every 5 (five) minutes as needed for chest pain.   0  . olmesartan (BENICAR) 5 MG tablet Take 5 mg by mouth daily.  0  . Probiotic  Product (PROBIOTIC PO) Take 1 tablet by mouth daily.    . tadalafil (CIALIS) 20 MG tablet Take 20 mg by mouth daily as needed for erectile dysfunction.     No current facility-administered medications for this visit.     REVIEW OF SYSTEMS:   Constitutional: Denies fevers, chills or abnormal night sweats Eyes: Denies blurriness of vision, double vision or watery eyes Ears, nose, mouth, throat, and face: Denies mucositis or sore throat Respiratory: Denies cough, dyspnea or wheezes Cardiovascular: Denies palpitation, chest discomfort or lower extremity swelling Gastrointestinal:  Denies nausea, heartburn or change in bowel habits Skin: Denies abnormal skin rashes Lymphatics: Denies new lymphadenopathy or easy bruising Neurological:Denies numbness, tingling or new weaknesses Behavioral/Psych: Mood is stable, no new changes  All other systems were reviewed with the patient and are negative.  PHYSICAL EXAMINATION:  Vitals:   09/09/17 1352  BP: (!) 123/56  Pulse: 66  Resp: 18  Temp: (!) 97.5 F (36.4 C)  SpO2: 100%   Filed Weights   09/09/17 1352  Weight: 191 lb 6.4 oz (86.8 kg)    GENERAL:alert, no distress and comfortable SKIN: skin color, texture, turgor are normal, no rashes or significant lesions EYES: normal, conjunctiva are pink and non-injected, sclera clear OROPHARYNX:no exudate, no erythema and lips, buccal mucosa, and tongue normal  NECK: supple, thyroid normal size, non-tender, without nodularity LYMPH:  no palpable lymphadenopathy in the cervical, axillary or inguinal LUNGS: clear to auscultation and percussion with normal breathing effort HEART: regular rate & rhythm and no murmurs and no lower extremity edema ABDOMEN:abdomen soft, non-tender and normal bowel sounds, no palpable hepatosplenomegaly Musculoskeletal:no cyanosis of digits and no clubbing  PSYCH: alert & oriented x 3 with fluent speech NEURO: no focal motor/sensory deficits  LABORATORY DATA:    . CBC Latest Ref Rng & Units 09/09/2017 07/30/2017 03/30/2017  WBC 4.0 - 10.3 K/uL 11.6(H) 11.9(H) 12.8(H)  Hemoglobin 13.2 - 17.1 g/dL - 96.2 95.2  Hematocrit 38.4 - 49.9 % 39.6 42.6 41.5  Platelets 140 - 400 K/uL 198 262 242    CBC    Component Value Date/Time   WBC 11.6 (H) 09/09/2017 1518   WBC 11.9 (H) 07/30/2017 0854   RBC 4.20 09/09/2017 1518  RBC 4.20 09/09/2017 1518   HGB 14.4 07/30/2017 0854   HCT 39.6 09/09/2017 1518   PLT 198 09/09/2017 1518   MCV 94.3 09/09/2017 1518   MCH 31.2 09/09/2017 1518   MCHC 33.1 09/09/2017 1518   RDW 13.6 09/09/2017 1518   LYMPHSABS 3.2 09/09/2017 1518   MONOABS 0.6 09/09/2017 1518   EOSABS 0.8 (H) 09/09/2017 1518   BASOSABS 0.1 09/09/2017 1518   . CMP Latest Ref Rng & Units 09/09/2017 07/30/2017 03/30/2017  Glucose 70 - 140 mg/dL 94 335(K) 68  BUN 7 - 26 mg/dL 11 11 12   Creatinine 0.70 - 1.30 mg/dL ) 5.62(B) 6.38(L)  Sodium 136 - 145 mmol/L 138 138 139  Potassium 3.5 - 5.1 mmol/L 4.4 4.4 4.4  Chloride 98 - 109 mmol/L 104 102 103  CO2 22 - 29 mmol/L 26 29 25   Calcium 8.4 - 10.4 mg/dL 8.9 9.3 8.9  Total Protein 6.4 - 8.3 g/dL 7.0 7.2 7.2  Total Bilirubin 0.2 - 1.2 mg/dL 0.2 0.5 0.4  Alkaline Phos 40 - 150 U/L 97 - 91  AST 5 - 34 U/L 22 20 22   ALT 0 - 55 U/L 21 22 27    Sed rate 12 (WNL)  . Lab Results  Component Value Date   LDH 175 09/09/2017       RADIOGRAPHIC STUDIES: I have personally reviewed the radiological images as listed and agreed with the findings in the report. No results found.  CT ABDOMEN AND PELVIS WITH CONTRAST (03/12/2017)   TECHNIQUE: Multidetector CT imaging of the abdomen and pelvis was performed using the standard protocol following bolus administration of intravenous contrast.  CONTRAST:  ISOVUE-300 IOPAMIDOL (ISOVUE-300) INJECTION 61%  COMPARISON:  07/11/2004  FINDINGS: Lower Chest: No acute findings.  Hepatobiliary: No hepatic masses identified. Gallbladder  is unremarkable.  Pancreas:  No mass or inflammatory changes.  Spleen: Within normal limits in size and appearance.  Adrenals/Urinary Tract: No masses identified. 2 mm nonobstructing calculus in upper pole of right kidney. No evidence of hydronephrosis or ureteral calculi. Unremarkable unopacified urinary bladder.  Stomach/Bowel: No evidence of obstruction, inflammatory process or abnormal fluid collections. Normal appendix visualized. Moderate to large colonic stool burden not significantly changed compared to prior study.  Vascular/Lymphatic: No pathologically enlarged lymph nodes. No abdominal aortic aneurysm. Aortic atherosclerosis.  Reproductive:  No mass or other significant abnormality.  Other:  None.  Musculoskeletal: No suspicious bone lesions identified. Bilateral L5 pars defects incidentally noted, without associated spondylolisthesis.  IMPRESSION: No evidence of appendicitis or other acute findings.  Tiny nonobstructing right renal calculus.  Aortic atherosclerosis.   Electronically Signed   By: 11/07/2017 M.D.   On: 03/12/2017 16:47   ASSESSMENT & PLAN:   52 yo 14[birth year]2004 of [place] with   1. Lymphocytosis  Patient has had some mild lymphocytosis 3-5k  Over the last year. These counts have been slowly increasing over the last year.   Lymphocyte counts are down from 4.5k to 3.2k on labs today. PLAN -we discussed that his lymphocytosis is mild and that the improvement of lymphocytosis is reassuring. -we discussed that it could be reactive related to patients smoking and  Chronic inflammation from rheumatoid arthritis. -Discussed that his Enbrel can increase the risk of lymphomas.  -no constitutional symptoms. Normal hgb and platelet count. -flow cytometry was done and showed no evidence of clonal B or T lymphocytosis and lymphocytosis resolved as well. -CT abd in 03/2017 showed no splenomegaly or LNadenopathy in the abdomen  at the time which is reassuring as well. -counseled patient on smoking cessation.  2. Mild Borderline Eosinophilia - Discussed that his borderline eosinophilia could be reactive to his rheumatoid arthritis vs allergies. - no indication for further w/u at this time. No overt evidence of lymphoma.  Labs today RTC in 4 months to monitor labs  All questions were answered. The patient knows to call the clinic with any problems, questions or concerns. I spent 40 minutes counseling the patient face to face. The total time spent in the appointment was 50 minutes and more than 50% was on counseling.  This document serves as a record of services personally performed by Wyvonnia Lora, MD. It was created on his behalf by Marcelline Mates, a trained medical scribe. The creation of this record is based on the scribe's personal observations and the provider's statements to them.   .I have reviewed the above documentation for accuracy and completeness, and I agree with the above. Johney Maine MD MS

## 2017-09-09 ENCOUNTER — Inpatient Hospital Stay: Payer: 59 | Attending: Hematology | Admitting: Hematology

## 2017-09-09 ENCOUNTER — Ambulatory Visit: Payer: 59 | Admitting: Rheumatology

## 2017-09-09 ENCOUNTER — Inpatient Hospital Stay: Payer: 59

## 2017-09-09 ENCOUNTER — Encounter: Payer: Self-pay | Admitting: Hematology

## 2017-09-09 VITALS — BP 123/56 | HR 66 | Temp 97.5°F | Resp 18 | Ht 68.0 in | Wt 191.4 lb

## 2017-09-09 DIAGNOSIS — Z800 Family history of malignant neoplasm of digestive organs: Secondary | ICD-10-CM | POA: Insufficient documentation

## 2017-09-09 DIAGNOSIS — D72820 Lymphocytosis (symptomatic): Secondary | ICD-10-CM

## 2017-09-09 DIAGNOSIS — E1022 Type 1 diabetes mellitus with diabetic chronic kidney disease: Secondary | ICD-10-CM | POA: Diagnosis not present

## 2017-09-09 DIAGNOSIS — Z794 Long term (current) use of insulin: Secondary | ICD-10-CM

## 2017-09-09 DIAGNOSIS — Z885 Allergy status to narcotic agent status: Secondary | ICD-10-CM

## 2017-09-09 DIAGNOSIS — I2510 Atherosclerotic heart disease of native coronary artery without angina pectoris: Secondary | ICD-10-CM

## 2017-09-09 DIAGNOSIS — M069 Rheumatoid arthritis, unspecified: Secondary | ICD-10-CM

## 2017-09-09 DIAGNOSIS — D721 Eosinophilia, unspecified: Secondary | ICD-10-CM

## 2017-09-09 DIAGNOSIS — F17210 Nicotine dependence, cigarettes, uncomplicated: Secondary | ICD-10-CM | POA: Diagnosis not present

## 2017-09-09 DIAGNOSIS — N200 Calculus of kidney: Secondary | ICD-10-CM | POA: Diagnosis not present

## 2017-09-09 DIAGNOSIS — I700 Atherosclerosis of aorta: Secondary | ICD-10-CM

## 2017-09-09 DIAGNOSIS — R61 Generalized hyperhidrosis: Secondary | ICD-10-CM | POA: Insufficient documentation

## 2017-09-09 DIAGNOSIS — N183 Chronic kidney disease, stage 3 (moderate): Secondary | ICD-10-CM

## 2017-09-09 DIAGNOSIS — Z79899 Other long term (current) drug therapy: Secondary | ICD-10-CM | POA: Diagnosis not present

## 2017-09-09 DIAGNOSIS — Z8042 Family history of malignant neoplasm of prostate: Secondary | ICD-10-CM | POA: Insufficient documentation

## 2017-09-09 DIAGNOSIS — J449 Chronic obstructive pulmonary disease, unspecified: Secondary | ICD-10-CM | POA: Insufficient documentation

## 2017-09-09 DIAGNOSIS — K5900 Constipation, unspecified: Secondary | ICD-10-CM | POA: Insufficient documentation

## 2017-09-09 LAB — CBC WITH DIFFERENTIAL (CANCER CENTER ONLY)
Basophils Absolute: 0.1 10*3/uL (ref 0.0–0.1)
Basophils Relative: 1 %
Eosinophils Absolute: 0.8 10*3/uL — ABNORMAL HIGH (ref 0.0–0.5)
Eosinophils Relative: 7 %
HCT: 39.6 % (ref 38.4–49.9)
Hemoglobin: 13.1 g/dL (ref 13.0–17.1)
Lymphocytes Relative: 28 %
Lymphs Abs: 3.2 10*3/uL (ref 0.9–3.3)
MCH: 31.2 pg (ref 27.2–33.4)
MCHC: 33.1 g/dL (ref 32.0–36.0)
MCV: 94.3 fL (ref 79.3–98.0)
Monocytes Absolute: 0.6 10*3/uL (ref 0.1–0.9)
Monocytes Relative: 6 %
Neutro Abs: 6.8 10*3/uL — ABNORMAL HIGH (ref 1.5–6.5)
Neutrophils Relative %: 58 %
Platelet Count: 198 10*3/uL (ref 140–400)
RBC: 4.2 MIL/uL (ref 4.20–5.82)
RDW: 13.6 % (ref 11.0–14.6)
WBC Count: 11.6 10*3/uL — ABNORMAL HIGH (ref 4.0–10.3)

## 2017-09-09 LAB — CMP (CANCER CENTER ONLY)
ALT: 21 U/L (ref 0–55)
AST: 22 U/L (ref 5–34)
Albumin: 3.7 g/dL (ref 3.5–5.0)
Alkaline Phosphatase: 97 U/L (ref 40–150)
Anion gap: 8 (ref 3–11)
BUN: 11 mg/dL (ref 7–26)
CO2: 26 mmol/L (ref 22–29)
Calcium: 8.9 mg/dL (ref 8.4–10.4)
Chloride: 104 mmol/L (ref 98–109)
Creatinine: 1.38 mg/dL — ABNORMAL HIGH (ref 0.70–1.30)
GFR, Est AFR Am: >60 mL/min (ref >60)
GFR, Estimated: 57 mL/min — ABNORMAL LOW (ref >60)
Glucose, Bld: 94 mg/dL (ref 70–140)
Potassium: 4.4 mmol/L (ref 3.5–5.1)
Sodium: 138 mmol/L (ref 136–145)
Total Bilirubin: 0.2 mg/dL (ref 0.2–1.2)
Total Protein: 7 g/dL (ref 6.4–8.3)

## 2017-09-09 LAB — SAVE SMEAR

## 2017-09-09 LAB — SEDIMENTATION RATE: Sed Rate: 12 mm/hr (ref 0–16)

## 2017-09-09 LAB — RETICULOCYTES
RBC.: 4.2 MIL/uL (ref 4.20–5.82)
Retic Count, Absolute: 46.2 10*3/uL (ref 34.8–93.9)
Retic Ct Pct: 1.1 % (ref 0.8–1.8)

## 2017-09-09 LAB — LACTATE DEHYDROGENASE: LDH: 175 U/L (ref 125–245)

## 2017-09-09 NOTE — Patient Instructions (Signed)
Thank you for choosing [facility] to provide your oncology and hematology care.  To afford each patient quality time with our providers, please arrive 30 minutes before your scheduled appointment time.  If you arrive late for your appointment, you may be asked to reschedule.  We strive to give you quality time with our providers, and arriving late affects you and other patients whose appointments are after yours.   If you are a no show for multiple scheduled visits, you may be dismissed from the clinic at the providers discretion.    Again, thank you for choosing [facility], our hope is that these requests will decrease the amount of time that you wait before being seen by our physicians.  ______________________________________________________________________  Should you have questions after your visit to the [facility], please contact our office at [phone] between the hours of 8:30 and 4:30 p.m.    Voicemails left after 4:30p.m will not be returned until the following business day.    For prescription refill requests, please have your pharmacy contact us directly.  Please also try to allow 48 hours for prescription requests.    Please contact the scheduling department for questions regarding scheduling.  For scheduling of procedures such as PET scans, CT scans, MRI, Ultrasound, etc please contact central scheduling at [phone].    Resources For Cancer Patients and Caregivers:   Oncolink.org:  A wonderful resource for patients and healthcare providers for information regarding your disease, ways to tract your treatment, what to expect, etc.     American Cancer Society:  800-227-2345  Can help patients locate various types of support and financial assistance  Cancer Care: 1-800-813-HOPE (4673) Provides financial assistance, online support groups, medication/co-pay assistance.    [place] DSS:  336-641-3447 Where to apply for food  stamps, Medicaid, and utility assistance  Medicare Rights Center: 800-333-4114 Helps people with Medicare understand their rights and benefits, navigate the Medicare system, and secure the quality healthcare they deserve  SCAT: 336-333-6589 [place] [employer]'s shared-ride transportation service for eligible riders who have a disability that prevents them from riding the fixed route bus.    For additional information on assistance programs please contact our social worker:   Grier Hock/Abigail Elmore:  [phone]            

## 2017-09-10 LAB — KAPPA/LAMBDA LIGHT CHAINS
Kappa free light chain: 47.4 mg/L — ABNORMAL HIGH (ref 3.3–19.4)
Kappa, lambda light chain ratio: 1.73 — ABNORMAL HIGH (ref 0.26–1.65)
Lambda free light chains: 27.4 mg/L — ABNORMAL HIGH (ref 5.7–26.3)

## 2017-09-11 LAB — FLOW CYTOMETRY

## 2017-09-14 LAB — MULTIPLE MYELOMA PANEL, SERUM
Albumin SerPl Elph-Mcnc: 3.7 g/dL (ref 2.9–4.4)
Albumin/Glob SerPl: 1.2 (ref 0.7–1.7)
Alpha 1: 0.2 g/dL (ref 0.0–0.4)
Alpha2 Glob SerPl Elph-Mcnc: 0.6 g/dL (ref 0.4–1.0)
B-Globulin SerPl Elph-Mcnc: 1 g/dL (ref 0.7–1.3)
Gamma Glob SerPl Elph-Mcnc: 1.3 g/dL (ref 0.4–1.8)
Globulin, Total: 3.1 g/dL (ref 2.2–3.9)
IgA: 320 mg/dL (ref 90–386)
IgG (Immunoglobin G), Serum: 1276 mg/dL (ref 700–1600)
IgM (Immunoglobulin M), Srm: 130 mg/dL (ref 20–172)
Total Protein ELP: 6.8 g/dL (ref 6.0–8.5)

## 2017-09-16 ENCOUNTER — Telehealth: Payer: Self-pay | Admitting: *Deleted

## 2017-09-16 NOTE — Telephone Encounter (Signed)
Patient called and left a voice mail message stating,"I would like Dr. Candise Che to review my labs from 2/6 with me. I don't know how to interpret them. Return number is [phone].

## 2017-09-17 ENCOUNTER — Telehealth: Payer: Self-pay

## 2017-09-17 ENCOUNTER — Telehealth: Payer: Self-pay | Admitting: Hematology

## 2017-09-17 NOTE — Telephone Encounter (Signed)
Pt called with concern regarding elevated kappa/lambda light chains. Spoke with Dr. Candise Che, and plan is for MD to call and speak with the pt. Pt shared mobile phone number for contact [phone].

## 2017-09-17 NOTE — Telephone Encounter (Signed)
Labs reviewed   Lymphocytosis resolved. Likely reactive from smoking. Flow cytometry no clonal population. Myeloma panel neg. Serum FLC  Minimally increased with borderline abnormal ratio -- likely due to CKD. No other clinical or lab evidence of plasma cell dyscrasia.  Borderline eosinophilia - likely reactive/allergies. PDGFRA neg. Non progressive. Unlikely to be clonal. PLan -f/u in 6 months with labs -counseled on smoking cessation. . CBC Latest Ref Rng & Units 09/09/2017 07/30/2017 03/30/2017  WBC 4.0 - 10.3 K/uL 11.6(H) 11.9(H) 12.8(H)  Hemoglobin 13.2 - 17.1 g/dL - 14.4 13.5  Hematocrit 38.4 - 49.9 % 39.6 42.6 41.5  Platelets 140 - 400 K/uL 198 262 242   CBC    Component Value Date/Time   WBC 11.6 (H) 09/09/2017 1518   WBC 11.9 (H) 07/30/2017 0854   RBC 4.20 09/09/2017 1518   RBC 4.20 09/09/2017 1518   HGB 14.4 07/30/2017 0854   HCT 39.6 09/09/2017 1518   PLT 198 09/09/2017 1518   MCV 94.3 09/09/2017 1518   MCH 31.2 09/09/2017 1518   MCHC 33.1 09/09/2017 1518   RDW 13.6 09/09/2017 1518   LYMPHSABS 3.2 09/09/2017 1518   MONOABS 0.6 09/09/2017 1518   EOSABS 0.8 (H) 09/09/2017 1518   BASOSABS 0.1 09/09/2017 1518

## 2017-09-18 ENCOUNTER — Other Ambulatory Visit: Payer: Self-pay

## 2017-09-18 ENCOUNTER — Telehealth: Payer: Self-pay

## 2017-09-18 DIAGNOSIS — D72820 Lymphocytosis (symptomatic): Secondary | ICD-10-CM

## 2017-09-18 NOTE — Telephone Encounter (Signed)
Called pt to schedule 6 month f/u with labs. Pt confirmed appt for 03/15/18 at 0845 for labs and doctor visit. Orders placed for lab work.

## 2017-09-23 LAB — PDGFRA

## 2017-10-25 ENCOUNTER — Other Ambulatory Visit: Payer: Self-pay | Admitting: Rheumatology

## 2017-10-26 NOTE — Telephone Encounter (Signed)
Last Visit: 07/30/17 Next Visit: 01/07/18 Labs: 01/07/18 stable   TB Gold: 12/15/16 Neg   Okay to refill per Dr. Corliss Skains

## 2017-11-15 HISTORY — PX: MOUTH SURGERY: SHX715

## 2017-12-30 NOTE — Progress Notes (Signed)
Office Visit Note  Patient: [name]             Date of Birth: [birth year]           MRN: [id number]             PCP: [name] Matin, MD Referring: [name] Matin, MD Visit Date: 01/07/2018 Occupation: @GUAROCC @    Subjective:  Pain in multiple joints  History of Present Illness: [name] is a 52 y.o. male with history of seropositive rheumatoid arthritis and osteoarthritis.  Patient reports that about 5 weeks ago he had oral surgery performed to take a biopsy of a lesion on his tongue.  There was complications after the surgery and the wound has still not healed.  He has not resumed his Enbrel and is now having increased joint pain and intermittent swelling in multiple joints.  He states that his joint stiffness is also increased.  He denies any new rheumatoid nodules.  He states his bilateral knees have been causing increased discomfort as well as his hands, wrists, ankles and feet.       Activities of Daily Living:  Patient reports morning stiffness for 20-30  minutes.   Patient Reports nocturnal pain.  Difficulty dressing/grooming: Denies Difficulty climbing stairs: Reports Difficulty getting out of chair: Reports Difficulty using hands for taps, buttons, cutlery, and/or writing: Denies   Review of Systems  Constitutional: Negative for fatigue and night sweats.  HENT: Positive for mouth sores and mouth dryness. Negative for nose dryness.   Eyes: Negative for redness, visual disturbance and dryness.  Respiratory: Negative for cough, hemoptysis, shortness of breath and difficulty breathing.   Cardiovascular: Negative for chest pain, palpitations, hypertension, irregular heartbeat and swelling in legs/feet.  Gastrointestinal: Positive for constipation. Negative for blood in stool and diarrhea.  Endocrine: Negative for increased urination.  Genitourinary: Negative for painful urination.  Musculoskeletal: Positive for arthralgias, joint pain and morning  stiffness. Negative for joint swelling, myalgias, muscle weakness, muscle tenderness and myalgias.  Skin: Negative for color change, rash, hair loss, nodules/bumps, skin tightness, ulcers and sensitivity to sunlight.  Allergic/Immunologic: Negative for susceptible to infections.  Neurological: Negative for dizziness, fainting, memory loss, night sweats and weakness.  Hematological: Negative for swollen glands.  Psychiatric/Behavioral: Negative for depressed mood and sleep disturbance. The patient is not nervous/anxious.     PMFS History:  Patient Active Problem List   Diagnosis Date Noted  . Controlled type 1 diabetes mellitus with diabetic peripheral angiopathy without gangrene (HCC) 08/14/2017  . Dyslipidemia 03/26/2017  . High risk medication use 09/23/2016  . History of hypertension 09/23/2016  . History of chronic kidney disease 09/23/2016  . History of coronary artery disease 09/23/2016  . Tobacco abuse 09/23/2016  . Primary osteoarthritis of both knees 09/23/2016  . History of diabetes mellitus 09/23/2016  . 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 Chest pain with high risk for cardiac etiology 10/29/2015  . Asymptomatic bilateral carotid artery stenosis 06/08/2014  . Pleural effusion, bilateral 06/03/2014  . Dyspnea 06/01/2014  . Diabetes (HCC) 05/03/2014  . 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/03/2014  . 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 Chronic renal disease, stage 3, moderately decreased glomerular filtration rate between 30-59 mL/min/1.73 square meter (HCC) 05/03/2014  . CAD (coronary artery disease) 04/27/2014    Past Medical History:  Diagnosis Date  . Anginal pain (HCC)   . Arthritis    RA IN HANDS  . Asthma   . CAD (coronary artery disease)  04/27/2014  . Chronic renal disease, stage 3, moderately decreased glomerular filtration rate between 30-59 mL/min/1.73 square meter (HCC) 05/03/2014  . Coronary artery disease   . Diabetes (HCC) 05/03/2014  .  Diabetes mellitus without complication (HCC)    type 1  . 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/03/2014  . Shortness of breath   . Unstable angina pectoris (HCC) 04/25/2014    Family History  Problem Relation Age of Onset  . Prostate cancer Father   . Heart disease Father   . Stomach cancer Mother   . Heart disease Brother   . Asthma Daughter   . Healthy Daughter    Past Surgical History:  Procedure Laterality Date  . CARDIAC CATHETERIZATION  04/25/2014   BY DR Jacinto Halim  . CARDIAC CATHETERIZATION N/A 10/30/2015   Procedure: Left Heart Cath and Cors/Grafts Angiography;  Surgeon: Yates Decamp, MD;  Location: [facility] INVASIVE CV LAB;  Service: Cardiovascular;  Laterality: N/A;  . CARPAL TUNNEL RELEASE    . CORONARY ARTERY BYPASS GRAFT N/A 04/27/2014   Procedure: CORONARY ARTERY BYPASS GRAFTING on pump using left internal mammary artery to LAD coronary artery, right great saphenous vein graft to diagonal coronary artery with sequential to OM1 and circumflex coronary arteries. Right greater saphenous vein graft to posterior descending coronary artery. ;  Surgeon: Delight Ovens, MD;  Location: [facility] OR;  Service: Open Heart Surgery;  Laterality: N  . ENDOVEIN HARVEST OF GREATER SAPHENOUS VEIN Right 04/27/2014   Procedure: ENDOVEIN HARVEST OF GREATER SAPHENOUS VEIN;  Surgeon: Delight Ovens, MD;  Location: [facility] OR;  Service: Open Heart Surgery;  Laterality: Right;  . EYE SURGERY     LASER  . INTRAOPERATIVE TRANSESOPHAGEAL ECHOCARDIOGRAM N/A 04/27/2014   Procedure: INTRAOPERATIVE TRANSESOPHAGEAL ECHOCARDIOGRAM;  Surgeon: Delight Ovens, MD;  Location: [facility] OR;  Service: Open Heart Surgery;  Laterality: N/A;  . LEFT HEART CATHETERIZATION WITH CORONARY ANGIOGRAM N/A 04/25/2014   Procedure: LEFT HEART CATHETERIZATION WITH CORONARY ANGIOGRAM;  Surgeon: Pamella Pert, MD;  Location: [facility] CATH LAB;  Service: Cardiovascular;  Laterality: N/A;  . MOUTH SURGERY  11/15/2017   tongue surgery     Social History   Social History Narrative  . Not on file     Objective: Vital Signs: BP 117/72 (BP Location: Left Arm, Patient Position: Sitting, Cuff Size: Normal)   Pulse 75   Resp 17   Ht 5\' 8"  (1.727 m)   Wt 185 lb (83.9 kg)   BMI 28.13 kg/m    Physical Exam  Constitutional: He is oriented to person, place, and time. He appears well-developed and well-nourished.  HENT:  Head: Normocephalic and atraumatic.  Eyes: Pupils are equal, round, and reactive to light. Conjunctivae and EOM are normal.  Neck: Normal range of motion. Neck supple.  Cardiovascular: Normal rate, regular rhythm and normal heart sounds.  Pulmonary/Chest: Effort normal and breath sounds normal.  Abdominal: Soft. Bowel sounds are normal.  Lymphadenopathy:    He has no cervical adenopathy.  Neurological: He is alert and oriented to person, place, and time.  Skin: Skin is warm and dry. Capillary refill takes less than 2 seconds.  Psychiatric: He has a normal mood and affect. His behavior is normal.  Nursing note and vitals reviewed.    Musculoskeletal Exam: C-spine, thoracic spine, and lumbar spine good ROM. No midline spinal tenderness.  No SI joint tenderness.  Shoulder joints, elbow joints, wrist joints, MCPs, PIPs, and DIPs good ROM with no synovitis.  PIP and DIP synovial  thickening consistent with osteoarthritis. Hip joints, knee joints, ankle joints, MTPs, PIPs, and DIPs good ROM with no synovitis.  No warmth or effusion of knee joints.    CDAI Exam: CDAI Homunculus Exam:   Joint Counts:  CDAI Tender Joint count: 0 CDAI Swollen Joint count: 0  Global Assessments:  Patient Global Assessment: 4 Provider Global Assessment: 4  CDAI Calculated Score: 8    Investigation: No additional findings.TB Gold: 12/15/2016 Negative  CBC Latest Ref Rng & Units 01/01/2018 09/09/2017 07/30/2017  WBC 3.8 - 10.8 Thousand/uL 10.0 11.6(H) 11.9(H)  Hemoglobin 13.2 - 17.1 g/dL 48.8 89.1 69.4  Hematocrit 38.5 -  50.0 % 38.5 39.6 42.6  Platelets 140 - 400 Thousand/uL 242 198 262   CMP Latest Ref Rng & Units 01/01/2018 09/09/2017 07/30/2017  Glucose 65 - 99 mg/dL 50(T) 94 888(K)  BUN 7 - 25 mg/dL 13 11 11   Creatinine 0.70 - 1.33 mg/dL ) 8.00(L) 4.91(P)  Sodium 135 - 146 mmol/L 139 138 138  Potassium 3.5 - 5.3 mmol/L 4.2 4.4 4.4  Chloride 98 - 110 mmol/L 104 104 102  CO2 20 - 32 mmol/L 29 26 29   Calcium 8.6 - 10.3 mg/dL 8.8 8.9 9.3  Total Protein 6.1 - 8.1 g/dL 6.5 7.0 7.2  Total Bilirubin 0.2 - 1.2 mg/dL 0.4 0.2 0.5  Alkaline Phos 40 - 150 U/L - 97 -  AST 10 - 35 U/L 15 22 20   ALT 9 - 46 U/L 18 21 22     Imaging: No results found.  Speciality Comments: No specialty comments available.    Procedures:  No procedures performed Allergies: Morphine and related   Assessment / Plan:     Visit Diagnoses: Rheumatoid arthritis involving multiple sites with positive rheumatoid factor (HCC) - Positive RF, positive anti-CCP, erosive disease with nodulosis: He has no active synovitis on exam.  He has been having increased joint pain and intermittent swelling in multiple joints including his bilateral hands bilateral wrists and bilateral ankles.  He has been off of his Enbrel for about 5 weeks due to having oral surgery at that time.  We will not restart his Enbrel at this time due to the risk of infection while his wound is still healing.  He was given a prescription for Diflucan 200 mg by mouth daily for 14 days for treatment of oral candidiasis.  He was advised to follow-up with his PCP for further management and monitoring.  We will see him back in 5 months or sooner if he is having increased joint pain and joint swelling while off of Enbrel.  High risk medication use -He has been off of his Enbrel for 5 weeks due to having oral surgery.  We will not resume his Enbrel at this time due to his surgical wound still healing.  Rheumatoid nodulosis (HCC): He has no new rheumatoid nodules.    Primary  osteoarthritis of both knees: No warmth or effusion noted on exam.  He has been having increased discomfort in bilateral knees.  History of coronary artery disease - s/p CABG : Followed up by cardiology  Other medical conditions are listed as follows:  History of hypertension  History of diabetes mellitus  Dyslipidemia  History of chronic kidney disease - fu by Dr.Webb  Tobacco abuse    Orders: No orders of the defined types were placed in this encounter.  Meds ordered this encounter  Medications  . fluconazole (DIFLUCAN) 200 MG tablet    Sig: Take 1 tablet (200 mg  total) by mouth daily.    Dispense:  14 tablet    Refill:  0   Face-to-face time spent with patient was . >50% of time was spent in counseling and coordination of care.  Follow-Up Instructions: Return for Rheumatoid arthritis, Osteoarthritis.   Gearldine Bienenstock, PA-C   I examined and evaluated the patient with Sherron Ales PA.  Patient had no synovitis on examination.  We will hold Enbrel until his course of Diflucan is complete.  The plan of care was discussed as noted above.  Pollyann Savoy, MD  Note - This record has been created using Animal nutritionist.  Chart creation errors have been sought, but may not always  have been located. Such creation errors do not reflect on  the standard of medical care.

## 2018-01-01 ENCOUNTER — Other Ambulatory Visit: Payer: Self-pay | Admitting: *Deleted

## 2018-01-01 DIAGNOSIS — Z79899 Other long term (current) drug therapy: Secondary | ICD-10-CM

## 2018-01-04 LAB — COMPLETE METABOLIC PANEL WITH GFR
AG Ratio: 1.5 (calc) (ref 1.0–2.5)
ALT: 18 U/L (ref 9–46)
AST: 15 U/L (ref 10–35)
Albumin: 3.9 g/dL (ref 3.6–5.1)
Alkaline phosphatase (APISO): 93 U/L (ref 40–115)
BUN/Creatinine Ratio: 9 (calc) (ref 6–22)
BUN: 13 mg/dL (ref 7–25)
CO2: 29 mmol/L (ref 20–32)
Calcium: 8.8 mg/dL (ref 8.6–10.3)
Chloride: 104 mmol/L (ref 98–110)
Creat: 1.49 mg/dL — ABNORMAL HIGH (ref 0.70–1.33)
GFR, Est African American: 61 mL/min/{1.73_m2} (ref >=60)
GFR, Est Non African American: 53 mL/min/{1.73_m2} — ABNORMAL LOW (ref >=60)
Globulin: 2.6 g/dL (calc) (ref 1.9–3.7)
Glucose, Bld: 51 mg/dL — ABNORMAL LOW (ref 65–99)
Potassium: 4.2 mmol/L (ref 3.5–5.3)
Sodium: 139 mmol/L (ref 135–146)
Total Bilirubin: 0.4 mg/dL (ref 0.2–1.2)
Total Protein: 6.5 g/dL (ref 6.1–8.1)

## 2018-01-04 LAB — CBC WITH DIFFERENTIAL/PLATELET
Basophils Absolute: 120 cells/uL (ref 0–200)
Basophils Relative: 1.2 %
Eosinophils Absolute: 880 cells/uL — ABNORMAL HIGH (ref 15–500)
Eosinophils Relative: 8.8 %
HCT: 38.5 % (ref 38.5–50.0)
Hemoglobin: 13.3 g/dL (ref 13.2–17.1)
Lymphs Abs: 3750 cells/uL (ref 850–3900)
MCH: 30.8 pg (ref 27.0–33.0)
MCHC: 34.5 g/dL (ref 32.0–36.0)
MCV: 89.1 fL (ref 80.0–100.0)
MPV: 10.9 fL (ref 7.5–12.5)
Monocytes Relative: 7.6 %
Neutro Abs: 4490 cells/uL (ref 1500–7800)
Neutrophils Relative %: 44.9 %
Platelets: 242 10*3/uL (ref 140–400)
RBC: 4.32 10*6/uL (ref 4.20–5.80)
RDW: 13 % (ref 11.0–15.0)
Total Lymphocyte: 37.5 %
WBC mixed population: 760 cells/uL (ref 200–950)
WBC: 10 10*3/uL (ref 3.8–10.8)

## 2018-01-04 LAB — QUANTIFERON-TB GOLD PLUS
Mitogen-NIL: >10 IU/mL
NIL: 0.21 IU/mL
QuantiFERON-TB Gold Plus: NEGATIVE
TB1-NIL: <0 IU/mL
TB2-NIL: <0 IU/mL

## 2018-01-04 NOTE — Progress Notes (Signed)
stable °

## 2018-01-07 ENCOUNTER — Encounter: Payer: Self-pay | Admitting: Rheumatology

## 2018-01-07 ENCOUNTER — Ambulatory Visit: Payer: 59 | Admitting: Rheumatology

## 2018-01-07 VITALS — BP 117/72 | HR 75 | Resp 17 | Ht 68.0 in | Wt 185.0 lb

## 2018-01-07 DIAGNOSIS — Z79899 Other long term (current) drug therapy: Secondary | ICD-10-CM

## 2018-01-07 DIAGNOSIS — M0579 Rheumatoid arthritis with rheumatoid factor of multiple sites without organ or systems involvement: Secondary | ICD-10-CM | POA: Diagnosis not present

## 2018-01-07 DIAGNOSIS — E785 Hyperlipidemia, unspecified: Secondary | ICD-10-CM | POA: Diagnosis not present

## 2018-01-07 DIAGNOSIS — Z720 Tobacco use: Secondary | ICD-10-CM | POA: Diagnosis not present

## 2018-01-07 DIAGNOSIS — Z8679 Personal history of other diseases of the circulatory system: Secondary | ICD-10-CM | POA: Diagnosis not present

## 2018-01-07 DIAGNOSIS — Z87448 Personal history of other diseases of urinary system: Secondary | ICD-10-CM | POA: Diagnosis not present

## 2018-01-07 DIAGNOSIS — Z8639 Personal history of other endocrine, nutritional and metabolic disease: Secondary | ICD-10-CM | POA: Diagnosis not present

## 2018-01-07 DIAGNOSIS — M0630 Rheumatoid nodule, unspecified site: Secondary | ICD-10-CM | POA: Diagnosis not present

## 2018-01-07 DIAGNOSIS — M170 Bilateral primary osteoarthritis of knee: Secondary | ICD-10-CM | POA: Diagnosis not present

## 2018-01-07 MED ORDER — FLUCONAZOLE 200 MG PO TABS: 200.0000 mg | ORAL_TABLET | Freq: Every day | ORAL | 0 refills | Status: DC

## 2018-01-07 NOTE — Patient Instructions (Signed)
Standing Labs We placed an order today for your standing lab work.    Please come back and get your standing labs in August and every 3 months   We have open lab Monday through Friday from 8:30-11:30 AM and 1:30-4:00 PM  at the office of Dr. Shaili Deveshwar.   You may experience shorter wait times on Monday and Friday afternoons. The office is located at [address], [address], [place], [place] [address] No appointment is necessary.   Labs are drawn by [facility].  You may receive a bill from [facility] for your lab work. If you have any questions regarding directions or hours of operation,  please call [phone].    

## 2018-02-24 ENCOUNTER — Encounter (HOSPITAL_BASED_OUTPATIENT_CLINIC_OR_DEPARTMENT_OTHER): Payer: Self-pay | Admitting: *Deleted

## 2018-02-24 ENCOUNTER — Other Ambulatory Visit: Payer: Self-pay

## 2018-02-25 ENCOUNTER — Other Ambulatory Visit: Payer: Self-pay | Admitting: Otolaryngology

## 2018-02-25 ENCOUNTER — Encounter (HOSPITAL_BASED_OUTPATIENT_CLINIC_OR_DEPARTMENT_OTHER)
Admission: RE | Admit: 2018-02-25 | Discharge: 2018-02-25 | Disposition: A | Discharge status: Home / Self Care | Payer: 59 | Source: Ambulatory Visit | Attending: Otolaryngology | Admitting: Otolaryngology

## 2018-02-25 DIAGNOSIS — Z01812 Encounter for preprocedural laboratory examination: Secondary | ICD-10-CM | POA: Insufficient documentation

## 2018-02-25 LAB — BASIC METABOLIC PANEL
Anion gap: 11 (ref 5–15)
BUN: 6 mg/dL (ref 6–20)
CO2: 24 mmol/L (ref 22–32)
Calcium: 9.3 mg/dL (ref 8.9–10.3)
Chloride: 104 mmol/L (ref 98–111)
Creatinine, Ser: 1.2 mg/dL (ref 0.61–1.24)
GFR calc Af Amer: >60 mL/min (ref >60)
GFR calc non Af Amer: >60 mL/min (ref >60)
Glucose, Bld: 153 mg/dL — ABNORMAL HIGH (ref 70–99)
Potassium: 4.6 mmol/L (ref 3.5–5.1)
Sodium: 139 mmol/L (ref 135–145)

## 2018-03-01 ENCOUNTER — Ambulatory Visit (HOSPITAL_BASED_OUTPATIENT_CLINIC_OR_DEPARTMENT_OTHER): Payer: 59 | Admitting: Certified Registered"

## 2018-03-01 ENCOUNTER — Encounter (HOSPITAL_BASED_OUTPATIENT_CLINIC_OR_DEPARTMENT_OTHER): Payer: Self-pay

## 2018-03-01 ENCOUNTER — Encounter (HOSPITAL_BASED_OUTPATIENT_CLINIC_OR_DEPARTMENT_OTHER)
Admission: RE | Disposition: A | Discharge status: Home / Self Care | Payer: Self-pay | Source: Ambulatory Visit | Attending: Otolaryngology

## 2018-03-01 ENCOUNTER — Other Ambulatory Visit: Payer: Self-pay

## 2018-03-01 ENCOUNTER — Ambulatory Visit (HOSPITAL_BASED_OUTPATIENT_CLINIC_OR_DEPARTMENT_OTHER)
Admission: RE | Admit: 2018-03-01 | Discharge: 2018-03-01 | Disposition: A | Discharge status: Home / Self Care | Payer: 59 | Source: Ambulatory Visit | Attending: Otolaryngology | Admitting: Otolaryngology

## 2018-03-01 DIAGNOSIS — Z8249 Family history of ischemic heart disease and other diseases of the circulatory system: Secondary | ICD-10-CM | POA: Diagnosis not present

## 2018-03-01 DIAGNOSIS — Z951 Presence of aortocoronary bypass graft: Secondary | ICD-10-CM | POA: Insufficient documentation

## 2018-03-01 DIAGNOSIS — K140 Glossitis: Secondary | ICD-10-CM | POA: Diagnosis not present

## 2018-03-01 DIAGNOSIS — M069 Rheumatoid arthritis, unspecified: Secondary | ICD-10-CM | POA: Diagnosis not present

## 2018-03-01 DIAGNOSIS — E1122 Type 2 diabetes mellitus with diabetic chronic kidney disease: Secondary | ICD-10-CM | POA: Diagnosis not present

## 2018-03-01 DIAGNOSIS — I2510 Atherosclerotic heart disease of native coronary artery without angina pectoris: Secondary | ICD-10-CM | POA: Diagnosis not present

## 2018-03-01 DIAGNOSIS — F17210 Nicotine dependence, cigarettes, uncomplicated: Secondary | ICD-10-CM | POA: Diagnosis not present

## 2018-03-01 DIAGNOSIS — Z794 Long term (current) use of insulin: Secondary | ICD-10-CM | POA: Diagnosis not present

## 2018-03-01 DIAGNOSIS — E1151 Type 2 diabetes mellitus with diabetic peripheral angiopathy without gangrene: Secondary | ICD-10-CM | POA: Insufficient documentation

## 2018-03-01 DIAGNOSIS — Z79899 Other long term (current) drug therapy: Secondary | ICD-10-CM | POA: Insufficient documentation

## 2018-03-01 DIAGNOSIS — I129 Hypertensive chronic kidney disease with stage 1 through stage 4 chronic kidney disease, or unspecified chronic kidney disease: Secondary | ICD-10-CM | POA: Insufficient documentation

## 2018-03-01 DIAGNOSIS — Z885 Allergy status to narcotic agent status: Secondary | ICD-10-CM | POA: Diagnosis not present

## 2018-03-01 DIAGNOSIS — G4733 Obstructive sleep apnea (adult) (pediatric): Secondary | ICD-10-CM | POA: Diagnosis not present

## 2018-03-01 DIAGNOSIS — N183 Chronic kidney disease, stage 3 (moderate): Secondary | ICD-10-CM | POA: Diagnosis not present

## 2018-03-01 DIAGNOSIS — Z7982 Long term (current) use of aspirin: Secondary | ICD-10-CM | POA: Diagnosis not present

## 2018-03-01 DIAGNOSIS — K135 Oral submucous fibrosis: Secondary | ICD-10-CM | POA: Insufficient documentation

## 2018-03-01 DIAGNOSIS — J45909 Unspecified asthma, uncomplicated: Secondary | ICD-10-CM | POA: Diagnosis not present

## 2018-03-01 DIAGNOSIS — K149 Disease of tongue, unspecified: Secondary | ICD-10-CM | POA: Diagnosis present

## 2018-03-01 HISTORY — DX: Sleep apnea, unspecified: G47.30

## 2018-03-01 HISTORY — PX: EXCISION ORAL TUMOR: SHX6265

## 2018-03-01 LAB — GLUCOSE, CAPILLARY
Glucose-Capillary: 208 mg/dL — ABNORMAL HIGH (ref 70–99)
Glucose-Capillary: 168 mg/dL — ABNORMAL HIGH (ref 70–99)

## 2018-03-01 SURGERY — EXCISION, NEOPLASM, MOUTH
Anesthesia: General | Site: Mouth

## 2018-03-01 MED ORDER — ROCURONIUM BROMIDE 10 MG/ML (PF) SYRINGE
PREFILLED_SYRINGE | INTRAVENOUS | Status: AC
Start: 2018-03-01 — End: ?
  Filled 2018-03-01: qty 10

## 2018-03-01 MED ORDER — DEXAMETHASONE SODIUM PHOSPHATE 10 MG/ML IJ SOLN
INTRAMUSCULAR | Status: AC
Start: 2018-03-01 — End: ?
  Filled 2018-03-01: qty 1

## 2018-03-01 MED ORDER — ONDANSETRON HCL 4 MG/2ML IJ SOLN
INTRAMUSCULAR | Status: AC
  Filled 2018-03-01: qty 2

## 2018-03-01 MED ORDER — LIDOCAINE HCL (CARDIAC) PF 100 MG/5ML IV SOSY
PREFILLED_SYRINGE | INTRAVENOUS | Status: DC | PRN
  Administered 2018-03-01: 100 mg via INTRAVENOUS

## 2018-03-01 MED ORDER — SUCCINYLCHOLINE CHLORIDE 20 MG/ML IJ SOLN
INTRAMUSCULAR | Status: DC | PRN
  Administered 2018-03-01: 140 mg via INTRAVENOUS

## 2018-03-01 MED ORDER — FENTANYL CITRATE (PF) 100 MCG/2ML IJ SOLN: 25.0000 ug | INTRAMUSCULAR | Status: DC | PRN

## 2018-03-01 MED ORDER — ONDANSETRON HCL 4 MG/2ML IJ SOLN: 4.0000 mg | Freq: Once | INTRAMUSCULAR | Status: DC | PRN

## 2018-03-01 MED ORDER — FENTANYL CITRATE (PF) 100 MCG/2ML IJ SOLN
INTRAMUSCULAR | Status: AC
  Filled 2018-03-01: qty 2

## 2018-03-01 MED ORDER — HYDROCODONE-ACETAMINOPHEN 7.5-325 MG/15ML PO SOLN: 15.0000 mL | Freq: Four times a day (QID) | ORAL | 0 refills | Status: DC | PRN

## 2018-03-01 MED ORDER — FENTANYL CITRATE (PF) 100 MCG/2ML IJ SOLN
50.0000 ug | INTRAMUSCULAR | Status: DC | PRN
  Administered 2018-03-01: 100 ug via INTRAVENOUS

## 2018-03-01 MED ORDER — ONDANSETRON HCL 4 MG/2ML IJ SOLN
INTRAMUSCULAR | Status: DC | PRN
  Administered 2018-03-01: 4 mg via INTRAVENOUS

## 2018-03-01 MED ORDER — SCOPOLAMINE 1 MG/3DAYS TD PT72: 1.0000 | MEDICATED_PATCH | Freq: Once | TRANSDERMAL | Status: DC | PRN

## 2018-03-01 MED ORDER — EPHEDRINE SULFATE 50 MG/ML IJ SOLN
INTRAMUSCULAR | Status: DC | PRN
  Administered 2018-03-01: 5 mg via INTRAVENOUS
  Administered 2018-03-01 (×2): 10 mg via INTRAVENOUS

## 2018-03-01 MED ORDER — LACTATED RINGERS IV SOLN
INTRAVENOUS | Status: DC
  Administered 2018-03-01: 09:00:00 via INTRAVENOUS

## 2018-03-01 MED ORDER — PROPOFOL 10 MG/ML IV BOLUS
INTRAVENOUS | Status: DC | PRN
  Administered 2018-03-01: 180 mg via INTRAVENOUS

## 2018-03-01 MED ORDER — PROPOFOL 10 MG/ML IV BOLUS
INTRAVENOUS | Status: AC
  Filled 2018-03-01: qty 40

## 2018-03-01 MED ORDER — EPHEDRINE SULFATE 50 MG/ML IJ SOLN
INTRAMUSCULAR | Status: AC
  Filled 2018-03-01: qty 1

## 2018-03-01 MED ORDER — LIDOCAINE-EPINEPHRINE 1 %-1:100000 IJ SOLN
INTRAMUSCULAR | Status: DC | PRN
  Administered 2018-03-01: 2 mL

## 2018-03-01 MED ORDER — OXYMETAZOLINE HCL 0.05 % NA SOLN
NASAL | Status: AC
  Filled 2018-03-01: qty 15

## 2018-03-01 MED ORDER — MIDAZOLAM HCL 2 MG/2ML IJ SOLN
INTRAMUSCULAR | Status: AC
  Filled 2018-03-01: qty 2

## 2018-03-01 MED ORDER — MIDAZOLAM HCL 2 MG/2ML IJ SOLN
1.0000 mg | INTRAMUSCULAR | Status: DC | PRN
  Administered 2018-03-01: 2 mg via INTRAVENOUS

## 2018-03-01 MED ORDER — BACITRACIN ZINC 500 UNIT/GM EX OINT
TOPICAL_OINTMENT | CUTANEOUS | Status: AC
  Filled 2018-03-01: qty 0.9

## 2018-03-01 MED ORDER — LIDOCAINE-EPINEPHRINE 1 %-1:100000 IJ SOLN
INTRAMUSCULAR | Status: AC
  Filled 2018-03-01: qty 1

## 2018-03-01 SURGICAL SUPPLY — 44 items
BLADE SURG 15 STRL LF DISP TIS (BLADE) ×1 IMPLANT
BLADE SURG 15 STRL SS (BLADE) ×2
CANISTER SUCT 1200ML W/VALVE (MISCELLANEOUS) IMPLANT
CATH ROBINSON RED A/P 12FR (CATHETERS) ×3 IMPLANT
COAGULATOR SUCT 6 FR SWTCH (ELECTROSURGICAL)
COAGULATOR SUCT SWTCH 10FR 6 (ELECTROSURGICAL) IMPLANT
COVER MAYO STAND STRL (DRAPES) ×3 IMPLANT
ELECT COATED BLADE 2.86 ST (ELECTRODE) ×3 IMPLANT
ELECT REM PT RETURN 9FT ADLT (ELECTROSURGICAL) ×3
ELECTRODE REM PT RTRN 9FT ADLT (ELECTROSURGICAL) ×1 IMPLANT
GLOVE BIOGEL M 7.0 STRL (GLOVE) ×3 IMPLANT
GLOVE BIOGEL M STRL SZ7.5 (GLOVE) ×3 IMPLANT
GLOVE BIOGEL PI IND STRL 8 (GLOVE) ×1 IMPLANT
GLOVE BIOGEL PI INDICATOR 8 (GLOVE) ×2
GOWN STRL REUS W/ TWL LRG LVL3 (GOWN DISPOSABLE) ×1 IMPLANT
GOWN STRL REUS W/ TWL XL LVL3 (GOWN DISPOSABLE) ×1 IMPLANT
GOWN STRL REUS W/TWL LRG LVL3 (GOWN DISPOSABLE) ×2
GOWN STRL REUS W/TWL XL LVL3 (GOWN DISPOSABLE) ×2
HEMOSTAT SNOW SURGICEL 2X4 (HEMOSTASIS) IMPLANT
HEMOSTAT SURGICEL .5X2 ABSORB (HEMOSTASIS) IMPLANT
HEMOSTAT SURGICEL 2X14 (HEMOSTASIS) IMPLANT
MARKER SKIN DUAL TIP RULER LAB (MISCELLANEOUS) IMPLANT
NEEDLE PRECISIONGLIDE 27X1.5 (NEEDLE) ×3 IMPLANT
NS IRRIG 1000ML POUR BTL (IV SOLUTION) ×3 IMPLANT
PACK BASIN DAY SURGERY FS (CUSTOM PROCEDURE TRAY) ×3 IMPLANT
PATTIES SURGICAL .5 X3 (DISPOSABLE) IMPLANT
PENCIL BUTTON HOLSTER BLD 10FT (ELECTRODE) ×3 IMPLANT
PENCIL FOOT CONTROL (ELECTRODE) IMPLANT
SHEET MEDIUM DRAPE 40X70 STRL (DRAPES) ×3 IMPLANT
SLEEVE SCD COMPRESS KNEE MED (MISCELLANEOUS) ×3 IMPLANT
SOLUTION BUTLER CLEAR DIP (MISCELLANEOUS) IMPLANT
SPONGE TONSIL TAPE 1 RFD (DISPOSABLE) IMPLANT
SPONGE TONSIL TAPE 1.25 RFD (DISPOSABLE) IMPLANT
SUT CHROMIC 4 0 RB 1X27 (SUTURE) IMPLANT
SUT SILK 2 0 PERMA HAND 18 BK (SUTURE) IMPLANT
SUT SILK 3 0 PS 1 (SUTURE) IMPLANT
SUT VIC AB 3-0 SH 27 (SUTURE)
SUT VIC AB 3-0 SH 27X BRD (SUTURE) IMPLANT
SYR BULB 3OZ (MISCELLANEOUS) ×3 IMPLANT
SYR CONTROL 10ML LL (SYRINGE) ×3 IMPLANT
TOWEL GREEN STERILE FF (TOWEL DISPOSABLE) ×3 IMPLANT
TUBE CONNECTING 20'X1/4 (TUBING) ×1
TUBE CONNECTING 20X1/4 (TUBING) ×2 IMPLANT
TUBE SALEM SUMP 16 FR W/ARV (TUBING) IMPLANT

## 2018-03-01 NOTE — H&P (Signed)
[name] is an 52 y.o. male.   Chief Complaint: Tongue lesion HPI: 52 year old male with RA, diabetes, and heart disease underwent excision of central tongue base lesion by Dr. Jeanice Lim in April.  The wound did not heal well and has left him with prominent bumps on the back of the tongue that affect speech and swallow.  He presents for surgical management.  Past Medical History:  Diagnosis Date  . Anginal pain (HCC)   . Arthritis    RA IN HANDS  . Asthma   . CAD (coronary artery disease) 04/27/2014  . Chronic renal disease, stage 3, moderately decreased glomerular filtration rate between 30-59 mL/min/1.73 square meter (HCC) 05/03/2014  . Coronary artery disease   . Diabetes (HCC) 05/03/2014  . Diabetes mellitus without complication (HCC)    type 1  . 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/03/2014  . Shortness of breath   . Sleep apnea    mild OSA, no CPAP  . Unstable angina pectoris (HCC) 04/25/2014    Past Surgical History:  Procedure Laterality Date  . CARDIAC CATHETERIZATION  04/25/2014   BY DR Jacinto Halim  . CARDIAC CATHETERIZATION N/A 10/30/2015   Procedure: Left Heart Cath and Cors/Grafts Angiography;  Surgeon: Yates Decamp, MD;  Location[facility] INVASIVE CV LAB;  Service: Cardiovascular;  Laterality: N/A;  . CARPAL TUNNEL RELEASE    . CORONARY ARTERY BYPASS GRAFT N/A 04/27/2014   Procedure: CORONARY ARTERY BYPASS GRAFTING on pump using left internal mammary artery to LAD coronary artery, right great saphenous vein graft to diagonal coronary artery with sequential to OM1 and circumflex coronary arteries. Right greater saphenous vein graft to posterior descending coronary artery. ;  Surgeon: Delight Ovens, MD;  Location: [facility] OR;  Service: Open Heart Surgery;  Laterality: N  . ENDOVEIN HARVEST OF GREATER SAPHENOUS VEIN Right 04/27/2014   Procedure: ENDOVEIN HARVEST OF GREATER SAPHENOUS VEIN;  Surgeon: Delight Ovens, MD;  Location: [facility] OR;  Service: Open Heart Surgery;   Laterality: Right;  . EYE SURGERY     LASER  . INTRAOPERATIVE TRANSESOPHAGEAL ECHOCARDIOGRAM N/A 04/27/2014   Procedure: INTRAOPERATIVE TRANSESOPHAGEAL ECHOCARDIOGRAM;  Surgeon: Delight Ovens, MD;  Location: [facility] OR;  Service: Open Heart Surgery;  Laterality: N/A;  . LEFT HEART CATHETERIZATION WITH CORONARY ANGIOGRAM N/A 04/25/2014   Procedure: LEFT HEART CATHETERIZATION WITH CORONARY ANGIOGRAM;  Surgeon: Pamella Pert, MD;  Location: [facility] CATH LAB;  Service: Cardiovascular;  Laterality: N/A;  . MOUTH SURGERY  11/15/2017   tongue surgery     Family History  Problem Relation Age of Onset  . Prostate cancer Father   . Heart disease Father   . Stomach cancer Mother   . Heart disease Brother   . Asthma Daughter   . Healthy Daughter    Social History:  reports that he has been smoking cigarettes.  He has a 17.00 pack-year smoking history. He has never used smokeless tobacco. He reports that he drinks alcohol. He reports that he does not use drugs.  Allergies:  Allergies  Allergen Reactions  . Morphine And Related Nausea And Vomiting    Medications Prior to Admission  Medication Sig Dispense Refill  . aspirin 81 MG tablet Take 81 mg by mouth daily.    Marland Kitchen buPROPion (WELLBUTRIN SR) 150 MG 12 hr tablet Take 75 mg by mouth daily.     . COD LIVER OIL PO Take by mouth daily.    . Cyanocobalamin (VITAMIN B-12 PO) Take by mouth daily.    Marland Kitchen  Insulin Glargine (BASAGLAR KWIKPEN [place]) Inject into the skin. 18 units am 20 units pm    . insulin lispro (HUMALOG) 100 UNIT/ML injection Inject 4-17 Units into the skin See admin instructions. Inject 3-4 times daily with meals per sliding scale instructions on glucose meter: 1 unit for every 10 grams of carbs and CBG calculations    . metoprolol succinate (TOPROL-XL) 25 MG 24 hr tablet TK 1 T PO D  3  . Probiotic Product (PROBIOTIC PO) Take 1 tablet by mouth daily.    Marland Kitchen REPATHA SURECLICK 140 MG/ML SOAJ every 14 (fourteen) days.    Marland Kitchen etanercept (ENBREL  SURECLICK) 50 MG/ML injection INJECT 50MG  SUBCUTANEOUSLY EVERY WEEK 11.76 Syringe 0  . nitroGLYCERIN (NITROSTAT) 0.4 MG SL tablet Place 0.4 mg under the tongue every 5 (five) minutes as needed for chest pain.   0  . olmesartan (BENICAR) 5 MG tablet Take 5 mg by mouth daily.  0  . tadalafil (CIALIS) 20 MG tablet Take 20 mg by mouth daily as needed for erectile dysfunction.      Results for orders placed or performed during the hospital encounter of 03/01/18 (from the past 48 hour(s))  Glucose, capillary     Status: Abnormal   Collection Time: 03/01/18  8:46 AM  Result Value Ref Range   Glucose-Capillary 208 (H) 70 - 99 mg/dL   No results found.  Review of Systems  HENT: Positive for sore throat.     Blood pressure 118/61, pulse 63, temperature 97.6 F (36.4 C), temperature source Oral, resp. rate 16, height 5\' 8"  (1.727 m), weight 189 lb (85.7 kg), SpO2 98 %. Physical Exam  Constitutional: He is oriented to person, place, and time. He appears well-developed and well-nourished. No distress.  HENT:  Head: Normocephalic and atraumatic.  Right Ear: External ear normal.  Left Ear: External ear normal.  Nose: Nose normal.  Mouth/Throat: Oropharynx is clear and moist.  Central tongue base with healed wound with prominent appendages to either side.  Eyes: Pupils are equal, round, and reactive to light. Conjunctivae and EOM are normal.  Neck: Normal range of motion. Neck supple.  Cardiovascular: Normal rate.  Respiratory: Effort normal.  Musculoskeletal: Normal range of motion.  Neurological: He is alert and oriented to person, place, and time. No cranial nerve deficit.  Skin: Skin is warm and dry.  Psychiatric: He has a normal mood and affect. His behavior is normal. Judgment and thought content normal.     Assessment/Plan Tongue lesion To OR for excision of tongue lesion.  03/03/18, MD 03/01/2018, 9:13 AM

## 2018-03-01 NOTE — Transfer of Care (Signed)
Immediate Anesthesia Transfer of Care Note  Patient: [name]  Procedure(s) Performed: EXCISION ORAL TUMOR (N/A Mouth)  Patient Location: PACU  Anesthesia Type:General  Level of Consciousness: awake, alert  and oriented  Airway & Oxygen Therapy: Patient Spontanous Breathing and Patient connected to face mask oxygen  Post-op Assessment: Report given to RN and Post -op Vital signs reviewed and stable  Post vital signs: Reviewed and stable  Last Vitals:  Vitals Value Taken Time  BP    Temp    Pulse 66 03/01/2018 10:11 AM  Resp 15 03/01/2018 10:11 AM  SpO2 100 % 03/01/2018 10:11 AM  Vitals shown include unvalidated device data.  Last Pain:  Vitals:   03/01/18 0835  TempSrc: Oral  PainSc: 0-No pain         Complications: No apparent anesthesia complications

## 2018-03-01 NOTE — Anesthesia Procedure Notes (Signed)
Procedure Name: Intubation Performed by: Verita Lamb, CRNA Pre-anesthesia Checklist: Patient identified, Emergency Drugs available, Patient being monitored, Suction available and Timeout performed Patient Re-evaluated:Patient Re-evaluated prior to induction Oxygen Delivery Method: Circle system utilized Preoxygenation: Pre-oxygenation with 100% oxygen Induction Type: IV induction Ventilation: Mask ventilation without difficulty Laryngoscope Size: Mac, Glidescope and 3 Grade View: Grade I Tube type: Oral Tube size: 7.0 mm Airway Equipment and Method: Stylet Placement Confirmation: ETT inserted through vocal cords under direct vision,  positive ETCO2,  CO2 detector and breath sounds checked- equal and bilateral Secured at: 22 cm Tube secured with: Tape Dental Injury: Teeth and Oropharynx as per pre-operative assessment

## 2018-03-01 NOTE — Op Note (Signed)
NAME: [name], [name] J. MEDICAL RECORD ZL:[id number] ACCOUNT [id number] DATE OF BIRTH:[birth year] FACILITY: [facility] LOCATION: [facility] PHYSICIAN:Hakiem Malizia D. Levorn Oleski, MD  OPERATIVE REPORT  DATE OF PROCEDURE:  03/01/2018  PREOPERATIVE DIAGNOSIS:  Tongue base lesion.  POSTOPERATIVE DIAGNOSIS:  Tongue base lesion.  PROCEDURE:  Excision of central tongue base lesion.  SURGEON:  Christia Reading, MD.  ANESTHESIA:  General endotracheal anesthesia.  COMPLICATIONS:  None.  INDICATIONS:  The patient is a 52 year old male who underwent excision of a central tongue base lesion by Dr. Jeanice Lim, oral surgery, back in April.  It seems the procedure went longer than expected and the patient woke up with significant symptoms.   Subsequently, his wound did not heal well and broke open, probably related to his diabetes and rheumatoid arthritis management.  With time, the base of the wound has healed, but left him with prominent appendages along the sides of the wound that are  causing him foreign body sensation symptoms affecting speech and swallow.  He presents to the operating room for surgical management.  FINDINGS:  Findings are listed above.  The appendages of tissue were removed from each side of the wound and sent for pathology.  DESCRIPTION OF PROCEDURE:  The patient was identified in the holding room, informed consent having been obtained including discussion of risks, benefits, alternatives, the patient was brought to the operative suite and put the operative table in supine  position.  Anesthesia was induced.  The patient was intubated by the anesthesia team using a GlideScope without difficulty.  The eyes were taped closed and the bed was turned 90 degrees from anesthesia.  A bite guard was placed and the tongue was grasped  with gauze and retracted anteriorly.  The wound edges on each side were injected with local anesthetic.  Each piece of the appendages were grasped with a forceps and then removed  using needle-tipped Bovie on cutting mode.  This resulted in 3 or 4 small  wounds on each side of the original wound.  The tissue was sent together for pathology.  After this was completed, the throat was suctioned.  He was returned back to anesthesia for wakeup and was extubated in the recovery room in stable condition.  TN/NUANCE  D:03/01/2018 T:03/01/2018 JOB:[id number]

## 2018-03-01 NOTE — Anesthesia Preprocedure Evaluation (Addendum)
Anesthesia Evaluation  Patient identified by MRN, date of birth, ID band Patient awake    Reviewed: Allergy & Precautions, NPO status , Patient's Chart, lab work & pertinent test results, reviewed documented beta blocker date and time   Airway Mallampati: II  TM Distance: >3 FB Neck ROM: Full    Dental  (+) Teeth Intact, Dental Advisory Given   Pulmonary shortness of breath, asthma , sleep apnea , Current Smoker,    Pulmonary exam normal breath sounds clear to auscultation       Cardiovascular hypertension, Pt. on home beta blockers + angina with exertion + CAD, + CABG and + Peripheral Vascular Disease  Normal cardiovascular exam Rhythm:Regular Rate:Normal     Neuro/Psych negative neurological ROS     GI/Hepatic negative GI ROS, Neg liver ROS,   Endo/Other  diabetes, Type 1, Insulin Dependent  Renal/GU Renal InsufficiencyRenal disease     Musculoskeletal  (+) Arthritis , Rheumatoid disorders,    Abdominal   Peds  Hematology negative hematology ROS (+)   Anesthesia Other Findings Day of surgery medications reviewed with the patient.  Reproductive/Obstetrics                             Anesthesia Physical Anesthesia Plan  ASA: III  Anesthesia Plan: General   Post-op Pain Management:    Induction: Intravenous  PONV Risk Score and Plan: 2 and Ondansetron and Midazolam  Airway Management Planned: Oral ETT  Additional Equipment:   Intra-op Plan:   Post-operative Plan: Extubation in OR  Informed Consent: I have reviewed the patients History and Physical, chart, labs and discussed the procedure including the risks, benefits and alternatives for the proposed anesthesia with the patient or authorized representative who has indicated his/her understanding and acceptance.   Dental advisory given  Plan Discussed with: CRNA, Anesthesiologist and Surgeon  Anesthesia Plan Comments:         Anesthesia Quick Evaluation

## 2018-03-01 NOTE — Anesthesia Postprocedure Evaluation (Signed)
Anesthesia Post Note  Patient: [name]  Procedure(s) Performed: EXCISION ORAL TUMOR (N/A Mouth)     Patient location during evaluation: PACU Anesthesia Type: General Pain management: pain level controlled Vital Signs Assessment: post-procedure vital signs reviewed and stable Respiratory status: spontaneous breathing Cardiovascular status: stable Anesthetic complications: no    Last Vitals:  Vitals:   03/01/18 1030 03/01/18 1045  BP:  110/75  Pulse: 66 65  Resp: 15 18  Temp:    SpO2: 100% 98%    Last Pain:  Vitals:   03/01/18 1045  TempSrc:   PainSc: 0-No pain                 Shahmeer Bunn

## 2018-03-01 NOTE — Discharge Instructions (Signed)

## 2018-03-01 NOTE — Brief Op Note (Signed)
03/01/2018  9:51 AM  PATIENT:  [name]  53 y.o. male  PRE-OPERATIVE DIAGNOSIS:  Tongue wound  POST-OPERATIVE DIAGNOSIS:  same  PROCEDURE:  Procedure(s): EXCISION ORAL TUMOR (N/A)  SURGEON:  Surgeon(s) and Role:    Christia Reading, MD - Primary  PHYSICIAN ASSISTANT:   ASSISTANTS: none   ANESTHESIA:   general  EBL: None  BLOOD ADMINISTERED:none  DRAINS: none   LOCAL MEDICATIONS USED:  LIDOCAINE   SPECIMEN:  Source of Specimen:  central tongue base tissue  DISPOSITION OF SPECIMEN:  PATHOLOGY  COUNTS:  YES  TOURNIQUET:  * No tourniquets in log *  DICTATION: .Other Dictation: Dictation Number [id number]  PLAN OF CARE: Discharge to home after PACU  PATIENT DISPOSITION:  PACU - hemodynamically stable.   Delay start of Pharmacological VTE agent (>24hrs) due to surgical blood loss or risk of bleeding: no

## 2018-03-02 ENCOUNTER — Encounter (HOSPITAL_BASED_OUTPATIENT_CLINIC_OR_DEPARTMENT_OTHER): Payer: Self-pay | Admitting: Otolaryngology

## 2018-03-12 NOTE — Progress Notes (Signed)
HEMATOLOGY ONCOLOGY CLNIC NOTE  Date of Service:   03/15/18    Patient Care Team: [name], MD as PCP - General (Internal Medicine) Yates Decamp, MD as Consulting Physician (Cardiology) Pollyann Savoy, MD as Consulting Physician (Rheumatology) Elvis Coil, MD as Consulting Physician (Nephrology)  CHIEF COMPLAINTS/PURPOSE OF CONSULTATION:  Lymphocytosis  HISTORY OF PRESENTING ILLNESS:   [name] 52 y.o. male is here because of a referral from Dr. [name] regarding lymphocytosis. The pt reports that he is doing well overall. He takes Enbrel for his rheumatoid arthritis since 2008 with relief. He used to take methotrexate before taking Enbrel.   He received open heart surgery for CAD after a cardiac catheterization.  He notes that he smokes half a pack of cigarettes each day.  He has had several blood vessels taken out for his open heart surgery.   He sees Dr. Hyman Hopes regarding his kidneys, and reported having venous stasis for which he wears compression socks.  Recently, he notes feeling fatigued that has increased over the last year. 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  He notes that he has COPD, accompanied by a little SOB for which he uses an inhaler.   He denies any recent rheumatoid arthritis flare ups. He notes that he feels stiff in the mornings for up to 15-20 minutes.  Regarding his DM type 1, he denies neuropathy but has had laser therapy for his retinopathy.  He notes that after his open heart surgery and taking more medication, he has been sweating at night.  The pt denies going out of the country for any length of time recently or being exposed to any unsafe water. He notes some distension of his abdomen and constipation and received a colonoscopy and CT abd in August 2018 which didn't reveal anything significant. He notes not wanting to drink lots of water to alleviate his constipation and takes miralax with  some relief.  He notes that he has had adult back and chest acne which he f/u with his PCP.   Most recent lab results on 07/30/17 of CBC reveal WBC at 11.9, Lymph Abs at 4510 and Esosinophils Abs at 1035, and Monocytes Abs were not tested. Five months ago his WBC were at 12.8, Lymph Abs were at 4096, Eosinophils Abs at 896, and Monocytes Abs were elevated at 1024. Eight months ago his WBC were WNL at 10.5, Lymph Abs WNL at 3465, Eosinophils Abs were elevated at 735, and his Monocytes Abs were WNL at 525.  On review of systems, pt reports fatigue, little SOB, night sweats over the last 2 years, an intermittently swollen abdomen, increased mucous production,  and denies sudden weight loss, fevers, chills, pain along the spine, back pain, trouble passing urine, lower abdomen pain, testicular pain.   On PMHx the pt has rheumatoid arthritis, CAD, Type 1 DM, Chronic Renal Disease, and COPD. On FHx his father had prostate cancer, mother had stomach cancer, all 3 of dad's siblings died of cancer. Mother's 2 siblings all died of cancer too. On Social Hx he notes no drinking. He smokes a half pack of cigarettes per day.  Interval History:   [name] returns today for management and evaluation of his lymphocytosis. The patient's last visit with [place] was on 09/09/17. The pt reports that he is doing well overall.   The pt reports that he has had a couple oral surgeries in the interim which revealed benign  findings.   Of note since the patient's last visit, pt has had a Surgical biopsy of his tongue completed on 7/29/9 with results revealing benign squamous mucosa with mild non-specific inflammation and mild fibrosis. The pt notes that he did not rinse his mouth after using an emergency inhaler that resulted in a yeast infection.   He has maintained follow up with his PCP in the interim. He notes that he is continuing to try to quit smoking.  The pt notes that he has "always had issues with his stomach"  but notes a recent change in smaller-caliber stools that seem more flat to him. The pt notes that a recent medication change alleviated his previous constipation.  He notes that his last colonoscopy was 1-2 years ago.   Lab results today (03/15/18) of CBC w/diff, CMP, and Reticulocytes is as follows: all values are WNL except for WBC at 11.3k, Lymphs abs at 4.2k, Eosinophils abs at 1.0k, Glucose at 160, Creatinine at 1.44, Calcium at 8.4, GFR at 54. LDH 03/15/18 is WNL at 181.   On review of systems, pt reports good energy levels, and denies fevers, chills, night sweats, noticing any new lumps or bumps, back pain, abdominal pains, and any other symptoms.    MEDICAL HISTORY:  Past Medical History:  Diagnosis Date  . Anginal pain (HCC)   . Arthritis    RA IN HANDS  . Asthma   . CAD (coronary artery disease) 04/27/2014  . Chronic renal disease, stage 3, moderately decreased glomerular filtration rate between 30-59 mL/min/1.73 square meter (HCC) 05/03/2014  . Coronary artery disease   . Diabetes (HCC) 05/03/2014  . Diabetes mellitus without complication (HCC)    type 1  . 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/03/2014  . Shortness of breath   . Sleep apnea    mild OSA, no CPAP  . Unstable angina pectoris (HCC) 04/25/2014    SURGICAL HISTORY: Past Surgical History:  Procedure Laterality Date  . CARDIAC CATHETERIZATION  04/25/2014   BY DR Jacinto Halim  . CARDIAC CATHETERIZATION N/A 10/30/2015   Procedure: Left Heart Cath and Cors/Grafts Angiography;  Surgeon: Yates Decamp, MD;  Location: [facility] INVASIVE CV LAB;  Service: Cardiovascular;  Laterality: N/A;  . CARPAL TUNNEL RELEASE    . CORONARY ARTERY BYPASS GRAFT N/A 04/27/2014   Procedure: CORONARY ARTERY BYPASS GRAFTING on pump using left internal mammary artery to LAD coronary artery, right great saphenous vein graft to diagonal coronary artery with sequential to OM1 and circumflex coronary arteries. Right greater saphenous vein graft to  posterior descending coronary artery. ;  Surgeon: Delight Ovens, MD;  Location: [facility] OR;  Service: Open Heart Surgery;  Laterality: N  . ENDOVEIN HARVEST OF GREATER SAPHENOUS VEIN Right 04/27/2014   Procedure: ENDOVEIN HARVEST OF GREATER SAPHENOUS VEIN;  Surgeon: Delight Ovens, MD;  Location: [facility] OR;  Service: Open Heart Surgery;  Laterality: Right;  . EXCISION ORAL TUMOR N/A 03/01/2018   Procedure: EXCISION ORAL TUMOR;  Surgeon: Christia Reading, MD;  Location: [facility];  Service: ENT;  Laterality: N/A;  . EYE SURGERY     LASER  . INTRAOPERATIVE TRANSESOPHAGEAL ECHOCARDIOGRAM N/A 04/27/2014   Procedure: INTRAOPERATIVE TRANSESOPHAGEAL ECHOCARDIOGRAM;  Surgeon: Delight Ovens, MD;  Location: [facility] OR;  Service: Open Heart Surgery;  Laterality: N/A;  . LEFT HEART CATHETERIZATION WITH CORONARY ANGIOGRAM N/A 04/25/2014   Procedure: LEFT HEART CATHETERIZATION WITH CORONARY ANGIOGRAM;  Surgeon: Pamella Pert, MD;  Location: [facility] CATH LAB;  Service: Cardiovascular;  Laterality: N/A;  . MOUTH SURGERY  11/15/2017   tongue surgery     SOCIAL HISTORY: Social History   Socioeconomic History  . Marital status: Married    Spouse name: Not on file  . Number of children: Not on file  . Years of education: Not on file  . Highest education level: Not on file  Occupational History  . Not on file  Social Needs  . Financial resource strain: Not on file  . Food insecurity:    Worry: Not on file    Inability: Not on file  . Transportation needs:    Medical: Not on file    Non-medical: Not on file  Tobacco Use  . Smoking status: Current Every Day Smoker    Packs/day: 0.50    Years: 34.00    Pack years: 17.00    Types: Cigarettes  . Smokeless tobacco: Never Used  . Tobacco comment: 10 per day   Substance and Sexual Activity  . Alcohol use: Yes    Alcohol/week: 0.0 standard drinks    Comment: RARE  . Drug use: No  . Sexual activity: Not on file  Lifestyle  . Physical activity:     Days per week: Not on file    Minutes per session: Not on file  . Stress: Not on file  Relationships  . Social connections:    Talks on phone: Not on file    Gets together: Not on file    Attends religious service: Not on file    Active member of club or organization: Not on file    Attends meetings of clubs or organizations: Not on file    Relationship status: Not on file  . Intimate partner violence:    Fear of current or ex partner: Not on file    Emotionally abused: Not on file    Physically abused: Not on file    Forced sexual activity: Not on file  Other Topics Concern  . Not on file  Social History Narrative  . Not on file    FAMILY HISTORY: Family History  Problem Relation Age of Onset  . Prostate cancer Father   . Heart disease Father   . Stomach cancer Mother   . Heart disease Brother   . Asthma Daughter   . Healthy Daughter     ALLERGIES:  is allergic to morphine and related.  MEDICATIONS:  Current Outpatient Medications  Medication Sig Dispense Refill  . aspirin 81 MG tablet Take 81 mg by mouth daily.    Marland Kitchen buPROPion (WELLBUTRIN SR) 150 MG 12 hr tablet Take 75 mg by mouth daily.     . COD LIVER OIL PO Take by mouth daily.    . Cyanocobalamin (VITAMIN B-12 PO) Take by mouth daily.    Marland Kitchen etanercept (ENBREL SURECLICK) 50 MG/ML injection INJECT 50MG  SUBCUTANEOUSLY EVERY WEEK 11.76 Syringe 0  . Insulin Glargine (BASAGLAR KWIKPEN [place]) Inject into the skin. 18 units am 20 units pm    . insulin lispro (HUMALOG) 100 UNIT/ML injection Inject 4-17 Units into the skin See admin instructions. Inject 3-4 times daily with meals per sliding scale instructions on glucose meter: 1 unit for every 10 grams of carbs and CBG calculations    . metoprolol succinate (TOPROL-XL) 25 MG 24 hr tablet TK 1 T PO D  3  . nitroGLYCERIN (NITROSTAT) 0.4 MG SL tablet Place 0.4 mg under the tongue every 5 (five) minutes as needed for chest pain.   0  .  olmesartan (BENICAR) 5 MG tablet Take 5  mg by mouth daily.  0  . Probiotic Product (PROBIOTIC PO) Take 1 tablet by mouth daily.    Marland Kitchen REPATHA SURECLICK 140 MG/ML SOAJ every 14 (fourteen) days.    . tadalafil (CIALIS) 20 MG tablet Take 20 mg by mouth daily as needed for erectile dysfunction.     No current facility-administered medications for this visit.     REVIEW OF SYSTEMS:   A 10+ POINT REVIEW OF SYSTEMS WAS OBTAINED including neurology, dermatology, psychiatry, cardiac, respiratory, lymph, extremities, GI, GU, Musculoskeletal, constitutional, breasts, reproductive, HEENT.  All pertinent positives are noted in the HPI.  All others are negative.   PHYSICAL EXAMINATION:  Vitals:   03/15/18 0939  BP: 108/64  Pulse: 66  Resp: 20  Temp: 98.3 F (36.8 C)  SpO2: 96%   Filed Weights   03/15/18 0939  Weight: 203 lb 3.2 oz (92.2 kg)    GENERAL:alert, in no acute distress and comfortable SKIN: no acute rashes, no significant lesions EYES: conjunctiva are pink and non-injected, sclera anicteric OROPHARYNX: MMM, no exudates, no oropharyngeal erythema or ulceration NECK: supple, no JVD LYMPH:  no palpable lymphadenopathy in the cervical, axillary or inguinal regions LUNGS: clear to auscultation b/l with normal respiratory effort HEART: regular rate & rhythm ABDOMEN:  normoactive bowel sounds , non tender, not distended. No palpable hepatosplenomegaly.  Extremity: no pedal edema PSYCH: alert & oriented x 3 with fluent speech NEURO: no focal motor/sensory deficits   LABORATORY DATA:   . CBC Latest Ref Rng & Units 03/15/2018 01/01/2018 09/09/2017  WBC 4.0 - 10.3 K/uL 11.3(H) 10.0 11.6(H)  Hemoglobin 13.0 - 17.1 g/dL 44.3 15.4 00.8  Hematocrit 38.4 - 49.9 % 39.8 38.5 39.6  Platelets 140 - 400 K/uL 199 242 198    CBC    Component Value Date/Time   WBC 11.3 (H) 03/15/2018 0846   WBC 10.0 01/01/2018 1432   RBC 4.27 03/15/2018 0846   RBC 4.27 03/15/2018 0846   HGB 13.2 03/15/2018 0846   HCT 39.8 03/15/2018 0846   PLT  199 03/15/2018 0846   MCV 93.2 03/15/2018 0846   MCH 30.9 03/15/2018 0846   MCHC 33.2 03/15/2018 0846   RDW 13.8 03/15/2018 0846   LYMPHSABS 4.2 (H) 03/15/2018 0846   MONOABS 0.6 03/15/2018 0846   EOSABS 1.0 (H) 03/15/2018 0846   BASOSABS 0.1 03/15/2018 0846   . CMP Latest Ref Rng & Units 03/15/2018 02/25/2018 01/01/2018  Glucose 70 - 99 mg/dL 676(P) 950(D) 32(I)  BUN 6 - 20 mg/dL 11 6 13   Creatinine 0.61 - 1.24 mg/dL ) 7.12(W 5.80)  Sodium 135 - 145 mmol/L 139 139 139  Potassium 3.5 - 5.1 mmol/L 4.3 4.6 4.2  Chloride 98 - 111 mmol/L 106 104 104  CO2 22 - 32 mmol/L 24 24 29   Calcium 8.9 - 10.3 mg/dL 9.98(P) 9.3 8.8  Total Protein 6.5 - 8.1 g/dL 7.0 - 6.5  Total Bilirubin 0.3 - 1.2 mg/dL 0.3 - 0.4  Alkaline Phos 38 - 126 U/L 97 - -  AST 15 - 41 U/L 17 - 15  ALT 0 - 44 U/L 17 - 18   Sed rate 12 (WNL)  . Lab Results  Component Value Date   LDH 181 03/15/2018       03/01/18 Surgical Pathology:    RADIOGRAPHIC STUDIES: I have personally reviewed the radiological images as listed and agreed with the findings in the report. No results found.  CT ABDOMEN  AND PELVIS WITH CONTRAST (03/12/2017)   TECHNIQUE: Multidetector CT imaging of the abdomen and pelvis was performed using the standard protocol following bolus administration of intravenous contrast.  CONTRAST:  ISOVUE-300 IOPAMIDOL (ISOVUE-300) INJECTION 61%  COMPARISON:  07/11/2004  FINDINGS: Lower Chest: No acute findings.  Hepatobiliary: No hepatic masses identified. Gallbladder is unremarkable.  Pancreas:  No mass or inflammatory changes.  Spleen: Within normal limits in size and appearance.  Adrenals/Urinary Tract: No masses identified. 2 mm nonobstructing calculus in upper pole of right kidney. No evidence of hydronephrosis or ureteral calculi. Unremarkable unopacified urinary bladder.  Stomach/Bowel: No evidence of obstruction, inflammatory process or abnormal fluid collections.  Normal appendix visualized. Moderate to large colonic stool burden not significantly changed compared to prior study.  Vascular/Lymphatic: No pathologically enlarged lymph nodes. No abdominal aortic aneurysm. Aortic atherosclerosis.  Reproductive:  No mass or other significant abnormality.  Other:  None.  Musculoskeletal: No suspicious bone lesions identified. Bilateral L5 pars defects incidentally noted, without associated spondylolisthesis.  IMPRESSION: No evidence of appendicitis or other acute findings. Tiny nonobstructing right renal calculus. Aortic atherosclerosis.  Electronically Signed   By: Myles Rosenthal M.D.   On: 03/12/2017 16:47   ASSESSMENT & PLAN:   52 y.o. [place] with   1. Lymphocytosis  Patient has had some mild lymphocytosis 3-5k  Over the last year. These counts have been slowly increasing over the last year.  CT abd in 03/2017 showed no splenomegaly or LNadenopathy in the abdomen at the time which is reassuring as well.  likely reactive related to patients smoking and  Chronic inflammation from rheumatoid arthritis. - his Enbrel can increase the risk of lymphomas.  Previous flow cytometry was done and showed no evidence of clonal B or T lymphocytosis and lymphocytosis resolved as well.  PLAN -we discussed that his lymphocytosis is mild and that the improvement of lymphocytosis is reassuring. --Discussed pt labwork today, 03/15/18; WBC stable at 11.3k and Lymphs slightly increased to 4.2k. HGB normal and PLT normal. LDH normal.  -No palpably new or enlarged lymph nodes or palpable splenomegaly  -Recommend the pt consume probiotics and cultured yogurt in addition to continuing to control his diabetes to limit risk of yeast infections -Reactive elements indicated from RA and smoking -Recommend continuing age-appropriate cancer screening with PCP -Discussed that it would be reasonable for the pt to return to care with his PCP as his  labs are stable and he is clinically stable -Recommend that PCP recheck lymphocytes every 6 months, and I would be happy to see this pt back as needed   2. Mild Borderline Eosinophilia - Discussed that his borderline eosinophilia could be reactive to his rheumatoid arthritis vs allergies. - no indication for further w/u at this time. No overt evidence of lymphoma.   RTC with Dr Candise Che as needed  All of the patient's questions were answered with apparent satisfaction. The patient knows to call the clinic with any problems, questions or concerns.  The total time spent in the appt was 23 minutes and more than 50% was on counseling and direct patient cares.  Wyvonnia Lora MD MS AAHIVMS [facility] Hematology/Oncology Physician [facility]  (Office):       [phone] (Work cell):  [phone] (Fax):           [phone]  I, Marcelline Mates, am acting as a scribe for Dr. Candise Che  .I have reviewed the above documentation for accuracy and completeness, and I agree with  the above. Brunetta Genera MD

## 2018-03-15 ENCOUNTER — Inpatient Hospital Stay: Payer: 59 | Attending: Hematology | Admitting: Hematology

## 2018-03-15 ENCOUNTER — Encounter: Payer: Self-pay | Admitting: Hematology

## 2018-03-15 ENCOUNTER — Inpatient Hospital Stay: Payer: 59

## 2018-03-15 VITALS — BP 108/64 | HR 66 | Temp 98.3°F | Resp 20 | Ht 68.0 in | Wt 203.2 lb

## 2018-03-15 DIAGNOSIS — E1122 Type 2 diabetes mellitus with diabetic chronic kidney disease: Secondary | ICD-10-CM

## 2018-03-15 DIAGNOSIS — J449 Chronic obstructive pulmonary disease, unspecified: Secondary | ICD-10-CM

## 2018-03-15 DIAGNOSIS — I2510 Atherosclerotic heart disease of native coronary artery without angina pectoris: Secondary | ICD-10-CM

## 2018-03-15 DIAGNOSIS — F17210 Nicotine dependence, cigarettes, uncomplicated: Secondary | ICD-10-CM

## 2018-03-15 DIAGNOSIS — M1990 Unspecified osteoarthritis, unspecified site: Secondary | ICD-10-CM | POA: Diagnosis not present

## 2018-03-15 DIAGNOSIS — Z79899 Other long term (current) drug therapy: Secondary | ICD-10-CM

## 2018-03-15 DIAGNOSIS — Z800 Family history of malignant neoplasm of digestive organs: Secondary | ICD-10-CM | POA: Diagnosis not present

## 2018-03-15 DIAGNOSIS — G4733 Obstructive sleep apnea (adult) (pediatric): Secondary | ICD-10-CM | POA: Diagnosis not present

## 2018-03-15 DIAGNOSIS — Z8042 Family history of malignant neoplasm of prostate: Secondary | ICD-10-CM

## 2018-03-15 DIAGNOSIS — N183 Chronic kidney disease, stage 3 (moderate): Secondary | ICD-10-CM

## 2018-03-15 DIAGNOSIS — Z794 Long term (current) use of insulin: Secondary | ICD-10-CM

## 2018-03-15 DIAGNOSIS — Z7982 Long term (current) use of aspirin: Secondary | ICD-10-CM

## 2018-03-15 DIAGNOSIS — K5900 Constipation, unspecified: Secondary | ICD-10-CM | POA: Diagnosis not present

## 2018-03-15 DIAGNOSIS — D72820 Lymphocytosis (symptomatic): Secondary | ICD-10-CM | POA: Diagnosis present

## 2018-03-15 DIAGNOSIS — R5383 Other fatigue: Secondary | ICD-10-CM | POA: Diagnosis not present

## 2018-03-15 DIAGNOSIS — M069 Rheumatoid arthritis, unspecified: Secondary | ICD-10-CM

## 2018-03-15 LAB — LACTATE DEHYDROGENASE: LDH: 181 U/L (ref 98–192)

## 2018-03-15 LAB — CMP (CANCER CENTER ONLY)
ALT: 17 U/L (ref 0–44)
AST: 17 U/L (ref 15–41)
Albumin: 3.7 g/dL (ref 3.5–5.0)
Alkaline Phosphatase: 97 U/L (ref 38–126)
Anion gap: 9 (ref 5–15)
BUN: 11 mg/dL (ref 6–20)
CO2: 24 mmol/L (ref 22–32)
Calcium: 8.4 mg/dL — ABNORMAL LOW (ref 8.9–10.3)
Chloride: 106 mmol/L (ref 98–111)
Creatinine: 1.44 mg/dL — ABNORMAL HIGH (ref 0.61–1.24)
GFR, Est AFR Am: >60 mL/min (ref >60)
GFR, Estimated: 54 mL/min — ABNORMAL LOW (ref >60)
Glucose, Bld: 160 mg/dL — ABNORMAL HIGH (ref 70–99)
Potassium: 4.3 mmol/L (ref 3.5–5.1)
Sodium: 139 mmol/L (ref 135–145)
Total Bilirubin: 0.3 mg/dL (ref 0.3–1.2)
Total Protein: 7 g/dL (ref 6.5–8.1)

## 2018-03-15 LAB — CBC WITH DIFFERENTIAL (CANCER CENTER ONLY)
Basophils Absolute: 0.1 10*3/uL (ref 0.0–0.1)
Basophils Relative: 1 %
Eosinophils Absolute: 1 10*3/uL — ABNORMAL HIGH (ref 0.0–0.5)
Eosinophils Relative: 9 %
HCT: 39.8 % (ref 38.4–49.9)
Hemoglobin: 13.2 g/dL (ref 13.0–17.1)
Lymphocytes Relative: 38 %
Lymphs Abs: 4.2 10*3/uL — ABNORMAL HIGH (ref 0.9–3.3)
MCH: 30.9 pg (ref 27.2–33.4)
MCHC: 33.2 g/dL (ref 32.0–36.0)
MCV: 93.2 fL (ref 79.3–98.0)
Monocytes Absolute: 0.6 10*3/uL (ref 0.1–0.9)
Monocytes Relative: 5 %
Neutro Abs: 5.3 10*3/uL (ref 1.5–6.5)
Neutrophils Relative %: 47 %
Platelet Count: 199 10*3/uL (ref 140–400)
RBC: 4.27 MIL/uL (ref 4.20–5.82)
RDW: 13.8 % (ref 11.0–14.6)
WBC Count: 11.3 10*3/uL — ABNORMAL HIGH (ref 4.0–10.3)

## 2018-03-15 LAB — RETICULOCYTES
RBC.: 4.27 MIL/uL (ref 4.20–5.82)
Retic Count, Absolute: 47 10*3/uL (ref 34.8–93.9)
Retic Ct Pct: 1.1 % (ref 0.8–1.8)

## 2018-03-16 ENCOUNTER — Telehealth: Payer: Self-pay

## 2018-03-16 NOTE — Telephone Encounter (Signed)
Per 8/13 los. RTC with Dr Candise Che on an as needed basis

## 2018-04-01 ENCOUNTER — Other Ambulatory Visit: Payer: Self-pay | Admitting: Rheumatology

## 2018-04-01 NOTE — Telephone Encounter (Signed)
Last Visit: 01/07/18 Next visit: 07/06/18 Labs: 03/15/18 Creat 1.44 previously normal, GFR 54 previously normal WBC 11.3 previously normal.  TB Gold: 01/01/18 Neg   Okay to refill Enbrel?

## 2018-04-01 NOTE — Telephone Encounter (Signed)
ok 

## 2018-05-21 DIAGNOSIS — E1136 Type 2 diabetes mellitus with diabetic cataract: Secondary | ICD-10-CM | POA: Insufficient documentation

## 2018-06-22 NOTE — Progress Notes (Signed)
Office Visit Note  Patient: [name]             Date of Birth: [birth year]           MRN: [id number]             PCP: [name], MD Referring: [name], MD Visit Date: 07/06/2018 Occupation: @GUAROCC @  Subjective:  Joint stiffness.  Current regimen includes Enbrel Sureclick 50 mg subq weekly.  Last TB gold negative 01/01/18.  Most recent CBC/CMP stable on 07/05/18.  Next CBC/CMP due in March and then every 3 months.  Standing orders in place.  Recommend flu, Pneumovax 23, Prevnar 13, and Shingrix as indicated.  History of Present Illness: [name] is a 52 y.o. male with history of seropositive rheumatoid arthritis and osteoarthritis.  He denies any joint pain or joint swelling.  He has been tolerating Enbrel well.  He had recent discoloration on his left arm which was treated as thrombophlebitis by his PCP.  He states he finished antibiotics.  Activities of Daily Living:  Patient reports morning stiffness for 20 minutes.   Patient Reports nocturnal pain. shoulders Difficulty dressing/grooming: Denies Difficulty climbing stairs: Denies Difficulty getting out of chair: Denies Difficulty using hands for taps, buttons, cutlery, and/or writing: Denies  Review of Systems  Constitutional: Negative for fatigue and night sweats.  HENT: Positive for mouth dryness. Negative for mouth sores and nose dryness.   Eyes: Negative for redness and dryness.  Respiratory: Negative for shortness of breath and difficulty breathing.   Cardiovascular: Negative for chest pain, palpitations, hypertension, irregular heartbeat and swelling in legs/feet.  Gastrointestinal: Positive for constipation. Negative for diarrhea.  Endocrine: Negative for increased urination.  Musculoskeletal: Positive for arthralgias and joint pain. Negative for joint swelling, myalgias, muscle weakness, morning stiffness, muscle tenderness and myalgias.  Skin: Negative for color change, rash, hair loss,  nodules/bumps, skin tightness, ulcers and sensitivity to sunlight.  Allergic/Immunologic: Negative for susceptible to infections.  Neurological: Negative for dizziness, fainting, memory loss, night sweats and weakness ( ).  Hematological: Negative for swollen glands.  Psychiatric/Behavioral: Negative for depressed mood and sleep disturbance. The patient is not nervous/anxious.     PMFS History:  Patient Active Problem List   Diagnosis Date Noted  . Controlled type 1 diabetes mellitus with diabetic peripheral angiopathy without gangrene (HCC) 08/14/2017  . Dyslipidemia 03/26/2017  . High risk medication use 09/23/2016  . History of hypertension 09/23/2016  . History of chronic kidney disease 09/23/2016  . History of coronary artery disease 09/23/2016  . Tobacco abuse 09/23/2016  . Primary osteoarthritis of both knees 09/23/2016  . History of diabetes mellitus 09/23/2016  . 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 Chest pain with high risk for cardiac etiology 10/29/2015  . Asymptomatic bilateral carotid artery stenosis 06/08/2014  . Pleural effusion, bilateral 06/03/2014  . Dyspnea 06/01/2014  . Diabetes (HCC) 05/03/2014  . 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/03/2014  . 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 Chronic renal disease, stage 3, moderately decreased glomerular filtration rate between 30-59 mL/min/1.73 square meter (HCC) 05/03/2014  . CAD (coronary artery disease) 04/27/2014    Past Medical History:  Diagnosis Date  . Anginal pain (HCC)   . Arthritis    RA IN HANDS  . Asthma   . CAD (coronary artery disease) 04/27/2014  . Chronic renal disease, stage 3, moderately decreased glomerular filtration rate between 30-59 mL/min/1.73 square meter (HCC) 05/03/2014  . Coronary artery disease   .  Diabetes (HCC) 05/03/2014  . Diabetes mellitus without complication (HCC)    type 1  . 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/03/2014  . Shortness of breath   .  Sleep apnea    mild OSA, no CPAP  . Unstable angina pectoris (HCC) 04/25/2014    Family History  Problem Relation Age of Onset  . Prostate cancer Father   . Heart disease Father   . Stomach cancer Mother   . Heart disease Brother   . Asthma Daughter   . Healthy Daughter    Past Surgical History:  Procedure Laterality Date  . CARDIAC CATHETERIZATION  04/25/2014   BY DR Jacinto Halim  . CARDIAC CATHETERIZATION N/A 10/30/2015   Procedure: Left Heart Cath and Cors/Grafts Angiography;  Surgeon: Yates Decamp, MD;  [facility] INVASIVE CV LAB;  Service: Cardiovascular;  Laterality: N/A;  . CARPAL TUNNEL RELEASE    . CORONARY ARTERY BYPASS GRAFT N/A 04/27/2014   Procedure: CORONARY ARTERY BYPASS GRAFTING on pump using left internal mammary artery to LAD coronary artery, right great saphenous vein graft to diagonal coronary artery with sequential to OM1 and circumflex coronary arteries. Right greater saphenous vein graft to posterior descending coronary artery. ;  Surgeon: Delight Ovens, MD;  Location[facility] OR;  Service: Open Heart Surgery;  Laterality: N  . ENDOVEIN HARVEST OF GREATER SAPHENOUS VEIN Right 04/27/2014   Procedure: ENDOVEIN HARVEST OF GREATER SAPHENOUS VEIN;  Surgeon: Delight Ovens, MD;  Location: [facility] OR;  Service: Open Heart Surgery;  Laterality: Right;  . EXCISION ORAL TUMOR N/A 03/01/2018   Procedure: EXCISION ORAL TUMOR;  Surgeon: Christia Reading, MD;  Location: [facility];  Service: ENT;  Laterality: N/A;  . EYE SURGERY     LASER  . INTRAOPERATIVE TRANSESOPHAGEAL ECHOCARDIOGRAM N/A 04/27/2014   Procedure: INTRAOPERATIVE TRANSESOPHAGEAL ECHOCARDIOGRAM;  Surgeon: Delight Ovens, MD;  Location: [facility] OR;  Service: Open Heart Surgery;  Laterality: N/A;  . LEFT HEART CATHETERIZATION WITH CORONARY ANGIOGRAM N/A 04/25/2014   Procedure: LEFT HEART CATHETERIZATION WITH CORONARY ANGIOGRAM;  Surgeon: Pamella Pert, MD;  Location: [facility] CATH LAB;  Service: Cardiovascular;  Laterality:  N/A;  . MOUTH SURGERY  11/15/2017   tongue surgery    Social History   Social History Narrative  . Not on file    Objective: Vital Signs: BP 121/66 (BP Location: Right Arm, Patient Position: Sitting, Cuff Size: Normal)   Pulse 66   Resp 15   Ht 5\' 8"  (1.727 m)   Wt 202 lb (91.6 kg)   BMI 30.71 kg/m    Physical Exam  Constitutional: He is oriented to person, place, and time. He appears well-developed and well-nourished.  HENT:  Head: Normocephalic and atraumatic.  Eyes: Pupils are equal, round, and reactive to light. Conjunctivae and EOM are normal.  Neck: Normal range of motion. Neck supple.  Cardiovascular: Normal rate, regular rhythm and normal heart sounds.  Pulmonary/Chest: Effort normal and breath sounds normal.  Abdominal: Soft. Bowel sounds are normal.  Neurological: He is alert and oriented to person, place, and time.  Skin: Skin is warm and dry. Capillary refill takes less than 2 seconds.  Psychiatric: He has a normal mood and affect. His behavior is normal.  Nursing note and vitals reviewed.    Musculoskeletal Exam: C-spine thoracic lumbar spine good range of motion.  Shoulder joints elbow joints wrist joint good range of motion with no synovitis.  He has some discomfort range of motion of the shoulder joints.  No  synovitis noted over MCP joints.  DIP and PIP thickening was noted.  Hip joints, knee joints, ankles MTPs were in good range of motion with no synovitis.  CDAI Exam: CDAI Score: 2.7  Patient Global Assessment: 5 (mm); Provider Global Assessment: 2 (mm) Swollen: 0 ; Tender: 2  Joint Exam      Right  Left  Glenohumeral   Tender   Tender     Investigation: No additional findings.  Imaging: Vas [place] Upper Extremity Venous Duplex  Result Date: 06/30/2018 UPPER VENOUS STUDY  Indications: Erythema, and Swelling Other Indications: Bug bite, left medial upper arm with redness. Patient has been on antibiotic for a few days. Risk Factors: None identified.  Performing Technologist: Dorthula Matas RVS, RCS  Examination Guidelines: A complete evaluation includes B-mode imaging, spectral Doppler, color Doppler, and power Doppler as needed of all accessible portions of each vessel. Bilateral testing is considered an integral part of a complete examination. Limited examinations for reoccurring indications may be performed as noted.  Left Findings: +----------+------------+----------+---------+-----------+-------+ LEFT      CompressiblePropertiesPhasicitySpontaneousSummary +----------+------------+----------+---------+-----------+-------+ IJV           Full                 Yes       Yes            +----------+------------+----------+---------+-----------+-------+ Subclavian    Full                 Yes       Yes            +----------+------------+----------+---------+-----------+-------+ Axillary      Full                 Yes       Yes            +----------+------------+----------+---------+-----------+-------+ Brachial      Full                 Yes       Yes            +----------+------------+----------+---------+-----------+-------+ Radial        Full                                          +----------+------------+----------+---------+-----------+-------+ Ulnar         Full                                          +----------+------------+----------+---------+-----------+-------+ Cephalic      Full                                          +----------+------------+----------+---------+-----------+-------+ Basilic       Full                                          +----------+------------+----------+---------+-----------+-------+ Findings reported to Katie at 2:05 pm.  Summary:  Left: No evidence of deep vein thrombosis in the upper extremity. No evidence of superficial vein thrombosis in the upper extremity.  *See table(s) above for measurements and  observations.  Diagnosing physician: Waverly Ferrari  MD Electronically signed by Waverly Ferrari MD on 06/30/2018 at 2:10:27 PM.    Final     Recent Labs: Lab Results  Component Value Date   WBC 10.2 07/05/2018   HGB 14.1 07/05/2018   PLT 238 07/05/2018   NA 138 07/05/2018   K 4.8 07/05/2018   CL 103 07/05/2018   CO2 29 07/05/2018   GLUCOSE 118 (H) 07/05/2018   BUN 12 07/05/2018   CREATININE 1.39 (H) 07/05/2018   BILITOT 0.4 07/05/2018   ALKPHOS 97 03/15/2018   AST 19 07/05/2018   ALT 20 07/05/2018   PROT 7.0 07/05/2018   ALBUMIN 3.7 03/15/2018   CALCIUM 9.6 07/05/2018   GFRAA 66 07/05/2018   QFTBGOLDPLUS NEGATIVE 01/01/2018    Speciality Comments: No specialty comments available.  Procedures:  No procedures performed Allergies: Morphine and related   Assessment / Plan:     Visit Diagnoses: Rheumatoid arthritis involving multiple sites with positive rheumatoid factor (HCC) - Positive RF, positive anti-CCP, erosive disease with nodulosis.  Patient complains of arthralgias but he had no synovitis on examination today.  Plan x-ray of bilateral hands and feet at next visit.  High risk medication use - Enbrel 50 mg subcu weekly.  He has been tolerating medication well.  Labs have been stable.  His labs will be monitored every 3 months.  We will refill his prescription for Enbrel.  Rheumatoid nodulosis (HCC)  Primary osteoarthritis of both knees-he has some discomfort in his knee joints.  Weight loss diet and exercise was discussed at length.  History of coronary artery disease - s/p CABG : Followed up by cardiology  History of chronic kidney disease -  fu by Dr.Webb.  His GFR is stable.  History of hypertension-his blood pressure is well controlled.  History of diabetes mellitus-patient states that his hemoglobin A1c was elevated at 7.8.  He is trying better control.  Dyslipidemia  Tobacco abuse -smoking cessation was discussed.  Association of heart disease with rheumatoid arthritis was discussed. Need to monitor  blood pressure, cholesterol, and to exercise 30-60 minutes on daily basis was discussed. Poor dental hygiene can be a predisposing factor for rheumatoid arthritis. Good dental hygiene was discussed.  Orders: No orders of the defined types were placed in this encounter.  Meds ordered this encounter  Medications  . etanercept (ENBREL SURECLICK) 50 MG/ML injection    Sig: INJECT 50MG  SUBCUTANEOUSLY EVERY WEEK    Dispense:  12 Syringe    Refill:  0     Follow-Up Instructions: Return in about 5 months (around 12/05/2018) for Rheumatoid arthritis.   Pollyann Savoy, MD  Note - This record has been created using Animal nutritionist.  Chart creation errors have been sought, but may not always  have been located. Such creation errors do not reflect on  the standard of medical care.

## 2018-06-28 DIAGNOSIS — L989 Disorder of the skin and subcutaneous tissue, unspecified: Secondary | ICD-10-CM | POA: Insufficient documentation

## 2018-06-28 DIAGNOSIS — N5314 Retrograde ejaculation: Secondary | ICD-10-CM | POA: Insufficient documentation

## 2018-06-30 ENCOUNTER — Ambulatory Visit (HOSPITAL_COMMUNITY)
Admission: RE | Admit: 2018-06-30 | Discharge: 2018-06-30 | Disposition: A | Discharge status: Home / Self Care | Payer: 59 | Source: Ambulatory Visit | Attending: Family | Admitting: Family

## 2018-06-30 ENCOUNTER — Other Ambulatory Visit (HOSPITAL_COMMUNITY): Payer: Self-pay | Admitting: Internal Medicine

## 2018-06-30 DIAGNOSIS — L03114 Cellulitis of left upper limb: Secondary | ICD-10-CM | POA: Insufficient documentation

## 2018-06-30 DIAGNOSIS — M7989 Other specified soft tissue disorders: Secondary | ICD-10-CM | POA: Diagnosis not present

## 2018-07-05 ENCOUNTER — Other Ambulatory Visit: Payer: Self-pay | Admitting: *Deleted

## 2018-07-05 DIAGNOSIS — Z79899 Other long term (current) drug therapy: Secondary | ICD-10-CM

## 2018-07-06 ENCOUNTER — Ambulatory Visit: Payer: 59 | Admitting: Rheumatology

## 2018-07-06 ENCOUNTER — Encounter: Payer: Self-pay | Admitting: Rheumatology

## 2018-07-06 VITALS — BP 121/66 | HR 66 | Resp 15 | Ht 68.0 in | Wt 202.0 lb

## 2018-07-06 DIAGNOSIS — Z87448 Personal history of other diseases of urinary system: Secondary | ICD-10-CM

## 2018-07-06 DIAGNOSIS — Z8679 Personal history of other diseases of the circulatory system: Secondary | ICD-10-CM

## 2018-07-06 DIAGNOSIS — M170 Bilateral primary osteoarthritis of knee: Secondary | ICD-10-CM

## 2018-07-06 DIAGNOSIS — M0579 Rheumatoid arthritis with rheumatoid factor of multiple sites without organ or systems involvement: Secondary | ICD-10-CM | POA: Diagnosis not present

## 2018-07-06 DIAGNOSIS — E785 Hyperlipidemia, unspecified: Secondary | ICD-10-CM

## 2018-07-06 DIAGNOSIS — M0630 Rheumatoid nodule, unspecified site: Secondary | ICD-10-CM | POA: Diagnosis not present

## 2018-07-06 DIAGNOSIS — Z79899 Other long term (current) drug therapy: Secondary | ICD-10-CM | POA: Diagnosis not present

## 2018-07-06 DIAGNOSIS — Z8639 Personal history of other endocrine, nutritional and metabolic disease: Secondary | ICD-10-CM

## 2018-07-06 DIAGNOSIS — Z720 Tobacco use: Secondary | ICD-10-CM

## 2018-07-06 LAB — CBC WITH DIFFERENTIAL/PLATELET
Basophils Absolute: 112 cells/uL (ref 0–200)
Basophils Relative: 1.1 %
Eosinophils Absolute: 847 cells/uL — ABNORMAL HIGH (ref 15–500)
Eosinophils Relative: 8.3 %
HCT: 41 % (ref 38.5–50.0)
Hemoglobin: 14.1 g/dL (ref 13.2–17.1)
Lymphs Abs: 4213 cells/uL — ABNORMAL HIGH (ref 850–3900)
MCH: 30.8 pg (ref 27.0–33.0)
MCHC: 34.4 g/dL (ref 32.0–36.0)
MCV: 89.5 fL (ref 80.0–100.0)
MPV: 11 fL (ref 7.5–12.5)
Monocytes Relative: 6.6 %
Neutro Abs: 4355 cells/uL (ref 1500–7800)
Neutrophils Relative %: 42.7 %
Platelets: 238 10*3/uL (ref 140–400)
RBC: 4.58 10*6/uL (ref 4.20–5.80)
RDW: 12.9 % (ref 11.0–15.0)
Total Lymphocyte: 41.3 %
WBC mixed population: 673 cells/uL (ref 200–950)
WBC: 10.2 10*3/uL (ref 3.8–10.8)

## 2018-07-06 LAB — COMPLETE METABOLIC PANEL WITH GFR
AG Ratio: 1.3 (calc) (ref 1.0–2.5)
ALT: 20 U/L (ref 9–46)
AST: 19 U/L (ref 10–35)
Albumin: 4 g/dL (ref 3.6–5.1)
Alkaline phosphatase (APISO): 99 U/L (ref 40–115)
BUN/Creatinine Ratio: 9 (calc) (ref 6–22)
BUN: 12 mg/dL (ref 7–25)
CO2: 29 mmol/L (ref 20–32)
Calcium: 9.6 mg/dL (ref 8.6–10.3)
Chloride: 103 mmol/L (ref 98–110)
Creat: 1.39 mg/dL — ABNORMAL HIGH (ref 0.70–1.33)
GFR, Est African American: 66 mL/min/{1.73_m2} (ref >=60)
GFR, Est Non African American: 57 mL/min/{1.73_m2} — ABNORMAL LOW (ref >=60)
Globulin: 3 g/dL (calc) (ref 1.9–3.7)
Glucose, Bld: 118 mg/dL — ABNORMAL HIGH (ref 65–99)
Potassium: 4.8 mmol/L (ref 3.5–5.3)
Sodium: 138 mmol/L (ref 135–146)
Total Bilirubin: 0.4 mg/dL (ref 0.2–1.2)
Total Protein: 7 g/dL (ref 6.1–8.1)

## 2018-07-06 MED ORDER — ETANERCEPT 50 MG/ML ~~LOC~~ SOAJ: SUBCUTANEOUS | 0 refills | Status: DC

## 2018-07-06 NOTE — Progress Notes (Signed)
Labs are stable.

## 2018-07-06 NOTE — Patient Instructions (Signed)
Standing Labs We placed an order today for your standing lab work.    Please come back and get your standing labs in March and every 3 months  We have open lab Monday through Friday from 8:30-11:30 AM and 1:30-4:00 PM  at the office of Dr. Zein Helbing.   You may experience shorter wait times on Monday and Friday afternoons. The office is located at [address], [address], [place], [place] [address] No appointment is necessary.   Labs are drawn by [facility].  You may receive a bill from [facility] for your lab work. If you have any questions regarding directions or hours of operation,  please call [phone].   Just as a reminder please drink plenty of water prior to coming for your lab work. Thanks!  

## 2018-09-30 ENCOUNTER — Other Ambulatory Visit: Payer: Self-pay

## 2018-09-30 DIAGNOSIS — I739 Peripheral vascular disease, unspecified: Secondary | ICD-10-CM

## 2018-10-05 ENCOUNTER — Other Ambulatory Visit: Payer: Self-pay

## 2018-10-05 ENCOUNTER — Ambulatory Visit (HOSPITAL_COMMUNITY)
Admission: RE | Admit: 2018-10-05 | Discharge: 2018-10-05 | Disposition: A | Discharge status: Home / Self Care | Payer: 59 | Source: Ambulatory Visit | Attending: Vascular Surgery | Admitting: Vascular Surgery

## 2018-10-05 ENCOUNTER — Ambulatory Visit (INDEPENDENT_AMBULATORY_CARE_PROVIDER_SITE_OTHER): Payer: 59 | Admitting: Vascular Surgery

## 2018-10-05 ENCOUNTER — Encounter: Payer: Self-pay | Admitting: Vascular Surgery

## 2018-10-05 DIAGNOSIS — I872 Venous insufficiency (chronic) (peripheral): Secondary | ICD-10-CM | POA: Diagnosis not present

## 2018-10-05 DIAGNOSIS — I739 Peripheral vascular disease, unspecified: Secondary | ICD-10-CM | POA: Diagnosis not present

## 2018-10-05 NOTE — Progress Notes (Signed)
Patient name: [name] MRN: [id number] DOB: 05-29-1966 Sex: male  REASON FOR VISIT: 1 year follow-up with ABIs and for monitoring lower extremity venous insufficiency  HPI: [name] Peeratrick J Hank is a 53 y.o. male with history of coronary artery disease status post CABG, diabetes that presents for a one-year follow-up for ongoing surveillance of mild PAD and lower extremity venous insufficiency.  Patient was previously followed by Dr. Imogene Burnhen and has been wearing bilateral lower extremity compression for deep reflux noted on referring hospital imaging.  In addition he was also being followed for mild PAD in the setting of long-term diabetes.  Follow-up today no significant wounds, rest pain or claudication.  He feels like his legs do swell whenever he stands for long periods of time if not wearing compression.  Past Medical History:  Diagnosis Date  . Anginal pain (HCC)   . Arthritis    RA IN HANDS  . Asthma   . CAD (coronary artery disease) 04/27/2014  . Chronic renal disease, stage 3, moderately decreased glomerular filtration rate between 30-59 mL/min/1.73 square meter (HCC) 05/03/2014  . Coronary artery disease   . Diabetes (HCC) 05/03/2014  . Diabetes mellitus without complication (HCC)    type 1  . 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/03/2014  . Shortness of breath   . Sleep apnea    mild OSA, no CPAP  . Unstable angina pectoris (HCC) 04/25/2014    Past Surgical History:  Procedure Laterality Date  . CARDIAC CATHETERIZATION  04/25/2014   BY DR Jacinto HalimGANJI  . CARDIAC CATHETERIZATION N/A 10/30/2015   Procedure: Left Heart Cath and Cors/Grafts Angiography;  Surgeon: Yates DecampJay Ganji, MD;  [facility];  Service: Cardiovascular;  Laterality: N/A;  . CARPAL TUNNEL RELEASE    . CORONARY ARTERY BYPASS GRAFT N/A 04/27/2014   Procedure: CORONARY ARTERY BYPASS GRAFTING on pump using left internal mammary artery to LAD coronary artery, right great saphenous vein graft to  diagonal coronary artery with sequential to OM1 and circumflex coronary arteries. Right greater saphenous vein graft to posterior descending coronary artery. ;  Surgeon: Delight OvensEdward B Gerhardt, MD;  [facility];  Service: Open Heart Surgery;  Laterality: N  . ENDOVEIN HARVEST OF GREATER SAPHENOUS VEIN Right 04/27/2014   Procedure: ENDOVEIN HARVEST OF GREATER SAPHENOUS VEIN;  Surgeon: Delight OvensEdward B Gerhardt, MD;  Location: [facility] OR;  Service: Open Heart Surgery;  Laterality: Right;  . EXCISION ORAL TUMOR N/A 03/01/2018   Procedure: EXCISION ORAL TUMOR;  Surgeon: Christia ReadingBates, Dwight, MD;  Location: [facility];  Service: ENT;  Laterality: N/A;  . EYE SURGERY     LASER  . INTRAOPERATIVE TRANSESOPHAGEAL ECHOCARDIOGRAM N/A 04/27/2014   Procedure: INTRAOPERATIVE TRANSESOPHAGEAL ECHOCARDIOGRAM;  Surgeon: Delight OvensEdward B Gerhardt, MD;  [facility];  Service: Open Heart Surgery;  Laterality: N/A;  . LEFT HEART CATHETERIZATION WITH CORONARY ANGIOGRAM N/A 04/25/2014   Procedure: LEFT HEART CATHETERIZATION WITH CORONARY ANGIOGRAM;  Surgeon: Pamella PertJagadeesh R Ganji, MD;  [facility];  Service: Cardiovascular;  Laterality: N/A;  . MOUTH SURGERY  11/15/2017   tongue surgery     Family History  Problem Relation Age of Onset  . Prostate cancer Father   . Heart disease Father   . Stomach cancer Mother   . Heart disease Brother   . Asthma Daughter   . Healthy Daughter     SOCIAL HISTORY: Social History   Tobacco Use  . Smoking status: Current Every Day Smoker    Packs/day: 0.50  Years: 34.00    Pack years: 17.00    Types: Cigarettes  . Smokeless tobacco: Never Used  . Tobacco comment: 10 per day   Substance Use Topics  . Alcohol use: Yes    Alcohol/week: 0.0 standard drinks    Comment: RARE    Allergies  Allergen Reactions  . Morphine And Related Nausea And Vomiting    Current Outpatient Medications  Medication Sig Dispense Refill  . aspirin 81 MG tablet Take 81 mg by mouth daily.    Marland Kitchen  buPROPion (WELLBUTRIN SR) 150 MG 12 hr tablet Take 75 mg by mouth daily.     . COD LIVER OIL PO Take by mouth daily.    . Cyanocobalamin (VITAMIN B-12 PO) Take by mouth daily.    Marland Kitchen etanercept (ENBREL SURECLICK) 50 MG/ML injection INJECT 50MG  SUBCUTANEOUSLY EVERY WEEK 12 Syringe 0  . Insulin Glargine (BASAGLAR KWIKPEN Cannon) Inject into the skin. 19 units am 19 units pm    . insulin lispro (HUMALOG) 100 UNIT/ML injection Inject 4-17 Units into the skin See admin instructions. Inject 3-4 times daily with meals per sliding scale instructions on glucose meter: 1 unit for every 10 grams of carbs and CBG calculations    . metoprolol succinate (TOPROL-XL) 25 MG 24 hr tablet TK 1 T PO D  3  . nitroGLYCERIN (NITROSTAT) 0.4 MG SL tablet Place 0.4 mg under the tongue every 5 (five) minutes as needed for chest pain.   0  . olmesartan (BENICAR) 5 MG tablet Take 5 mg by mouth daily.  0  . Probiotic Product (PROBIOTIC PO) Take 1 tablet by mouth daily.    Marland Kitchen REPATHA SURECLICK 140 MG/ML SOAJ every 14 (fourteen) days.    . tadalafil (CIALIS) 20 MG tablet Take 20 mg by mouth daily as needed for erectile dysfunction.    Marland Kitchen DOXYCYCLINE PO Take by mouth 2 (two) times daily.     No current facility-administered medications for this visit.     REVIEW OF SYSTEMS:  [X]  denotes positive finding, [ ]  denotes negative finding Cardiac  Comments:  Chest pain or chest pressure:    Shortness of breath upon exertion:    Short of breath when lying flat:    Irregular heart rhythm:        Vascular    Pain in calf, thigh, or hip brought on by ambulation:    Pain in feet at night that wakes you up from your sleep:     Blood clot in your veins:    Leg swelling:         Pulmonary    Oxygen at home:    Productive cough:     Wheezing:         Neurologic    Sudden weakness in arms or legs:     Sudden numbness in arms or legs:     Sudden onset of difficulty speaking or slurred speech:    Temporary loss of vision in one eye:      Problems with dizziness:         Gastrointestinal    Blood in stool:     Vomited blood:         Genitourinary    Burning when urinating:     Blood in urine:        Psychiatric    Major depression:         Hematologic    Bleeding problems:    Problems with blood clotting too easily:  Skin    Rashes or ulcers:        Constitutional    Fever or chills:      PHYSICAL EXAM: Vitals:   10/05/18 0838  BP: 114/62  Pulse: 78  Resp: 18  SpO2: 97%  Weight: 198 lb 3.1 oz (89.9 kg)  Height: 5\' 8"  (1.727 m)    GENERAL: The patient is a well-nourished male, in no acute distress. The vital signs are documented above. CARDIAC: There is a regular rate and rhythm.  VASCULAR:  2+ femoral pulses palpable bilaterally 2+ DP palpable bilaterally No tissue loss No varicosities PULMONARY: There is good air exchange bilaterally without wheezing or rales. ABDOMEN: Soft and non-tender with normal pitched bowel sounds.  MUSCULOSKELETAL: There are no major deformities or cyanosis. NEUROLOGIC: No focal weakness or paresthesias are detected.   DATA:   ABIs noncompressible but triphasic waveforms at the ankle  Assessment/Plan:  Overall Mr. Baylis seems to be doing well.  He is most concerned about the risk of limb loss given a chronic history of diabetes.  I offered him reassurance today given that he has palpable dorsalis pedis pulses on exam with triphasic waveforms on his noninvasive imaging even though his ABIs are noncompressible.  There is no role for intervention on his PAD at this time.  In regards to his venous insufficiency this is being fairly well managed with compression stockings.  Previously had evidence of deep reflux and discussed that we do not typically intervene on that.  He has had his right saphenous vein harvested for previous CABG.  We can certainly repeat venous study in the future if things get worse but it may not change our recommendations.  He would like to  come back in 1 year for ongoing surveillance.   Cephus Shelling, MD [facility] Office: [phone] Pager: [phone]  Cephus Shelling

## 2018-10-13 ENCOUNTER — Other Ambulatory Visit: Payer: Self-pay | Admitting: *Deleted

## 2018-10-13 DIAGNOSIS — Z79899 Other long term (current) drug therapy: Secondary | ICD-10-CM

## 2018-10-14 LAB — COMPLETE METABOLIC PANEL WITH GFR
AG Ratio: 1.5 (calc) (ref 1.0–2.5)
ALT: 19 U/L (ref 9–46)
AST: 19 U/L (ref 10–35)
Albumin: 3.9 g/dL (ref 3.6–5.1)
Alkaline phosphatase (APISO): 85 U/L (ref 35–144)
BUN/Creatinine Ratio: 8 (calc) (ref 6–22)
BUN: 12 mg/dL (ref 7–25)
CO2: 29 mmol/L (ref 20–32)
Calcium: 9.2 mg/dL (ref 8.6–10.3)
Chloride: 103 mmol/L (ref 98–110)
Creat: 1.51 mg/dL — ABNORMAL HIGH (ref 0.70–1.33)
GFR, Est African American: 60 mL/min/{1.73_m2} (ref >=60)
GFR, Est Non African American: 52 mL/min/{1.73_m2} — ABNORMAL LOW (ref >=60)
Globulin: 2.6 g/dL (calc) (ref 1.9–3.7)
Glucose, Bld: 200 mg/dL — ABNORMAL HIGH (ref 65–99)
Potassium: 5.2 mmol/L (ref 3.5–5.3)
Sodium: 138 mmol/L (ref 135–146)
Total Bilirubin: 0.4 mg/dL (ref 0.2–1.2)
Total Protein: 6.5 g/dL (ref 6.1–8.1)

## 2018-10-14 LAB — CBC WITH DIFFERENTIAL/PLATELET
Absolute Monocytes: 540 cells/uL (ref 200–950)
Basophils Absolute: 144 cells/uL (ref 0–200)
Basophils Relative: 1.6 %
Eosinophils Absolute: 765 cells/uL — ABNORMAL HIGH (ref 15–500)
Eosinophils Relative: 8.5 %
HCT: 42.6 % (ref 38.5–50.0)
Hemoglobin: 14.4 g/dL (ref 13.2–17.1)
Lymphs Abs: 3357 cells/uL (ref 850–3900)
MCH: 31.2 pg (ref 27.0–33.0)
MCHC: 33.8 g/dL (ref 32.0–36.0)
MCV: 92.2 fL (ref 80.0–100.0)
MPV: 11.1 fL (ref 7.5–12.5)
Monocytes Relative: 6 %
Neutro Abs: 4194 cells/uL (ref 1500–7800)
Neutrophils Relative %: 46.6 %
Platelets: 234 10*3/uL (ref 140–400)
RBC: 4.62 10*6/uL (ref 4.20–5.80)
RDW: 12.9 % (ref 11.0–15.0)
Total Lymphocyte: 37.3 %
WBC: 9 10*3/uL (ref 3.8–10.8)

## 2018-11-23 ENCOUNTER — Telehealth: Payer: Self-pay

## 2018-11-23 ENCOUNTER — Other Ambulatory Visit: Payer: Self-pay | Admitting: Rheumatology

## 2018-11-23 ENCOUNTER — Other Ambulatory Visit: Payer: Self-pay

## 2018-11-23 DIAGNOSIS — Z20828 Contact with and (suspected) exposure to other viral communicable diseases: Secondary | ICD-10-CM | POA: Insufficient documentation

## 2018-11-23 MED ORDER — REPATHA SURECLICK 140 MG/ML ~~LOC~~ SOAJ: 140.0000 mg | SUBCUTANEOUS | 3 refills | Status: DC

## 2018-11-23 NOTE — Telephone Encounter (Signed)
Last Visit: 07/06/2018 Next Visit: 12/07/2018 Labs: 10/13/2018 Glucose is elevated-200. Creatinine continues to trend up. GFR is low-52. Absolute eosinophils elevated but stable. Rest of CBC WNL. TB Gold: 01/01/2018 negative   Okay to refill per Dr. Corliss Skains.

## 2018-11-23 NOTE — Telephone Encounter (Signed)
What is the message?

## 2018-12-01 NOTE — Progress Notes (Signed)
Virtual Visit via Video Note  I connected with [name] on 12/01/18 at  8:45 AM EDT by a video enabled telemedicine application and verified that I am speaking with the correct person using two identifiers.   I discussed the limitations of evaluation and management by telemedicine and the availability of in person appointments. The patient expressed understanding and agreed to proceed. This service was conducted via virtual visit.  Both audio and visual tools were used.  The patient was located at home. I was located in my office.  Consent was obtained prior to the virtual visit and is aware of possible charges through their insurance for this visit.  The patient is an established patient.  Dr. Corliss Skains, MD conducted the virtual visit and Sherron Ales, PA-C acted as scribe during the service.  Office staff helped with scheduling follow up visits after the service was conducted.    CC: Bilateral knee joint pain   History of Present Illness: Patient is a 53 year male with a past medical history of seropositive rheumatoid arthritis and osteoarthritis.  He is prescribed Enbrel 50 mg sq once weekly. He states 2 weeks ago he was sick and was tested for strep throat and COVID-19, which were both negative.  He was treated with a Z-pak and has been self quarantining.  His symptoms have resolved.   He has been off of Enbrel for 3 weeks while sick, but he plans on restarting on Enbrel this weekend.  He has intermittent pain or swelling in both knee joints. He denies any other joint pain or joint swelling.    Review of Systems  Constitutional: Negative for fever and malaise/fatigue.  HENT:       +Dry mouth  Eyes: Negative for photophobia, pain, discharge and redness.  Respiratory: Negative for cough, shortness of breath and wheezing.   Cardiovascular: Negative for chest pain and palpitations.  Gastrointestinal: Negative for blood in stool, constipation and diarrhea.  Genitourinary: Negative for  dysuria.  Musculoskeletal: Positive for joint pain. Negative for back pain, myalgias and neck pain.       +Morning stiffness  Skin: Negative for rash.  Neurological: Negative for dizziness and headaches.  Psychiatric/Behavioral: Negative for depression. The patient is not nervous/anxious and does not have insomnia.      Observations/Objective: Physical Exam  Constitutional: He is oriented to person, place, and time and well-developed, well-nourished, and in no distress.  HENT:  Head: Normocephalic and atraumatic.  Eyes: Conjunctivae are normal.  Pulmonary/Chest: Effort normal.  Neurological: He is alert and oriented to person, place, and time.  Psychiatric: Mood, memory, affect and judgment normal.   Patient reports morning stiffness for 10  minutes.   Patient denies nocturnal pain.  Difficulty dressing/grooming: Denies Difficulty climbing stairs: Denies Difficulty getting out of chair: Denies Difficulty using hands for taps, buttons, cutlery, and/or writing: Denies   BP today: 137/78 (checked by patient at home)  Assessment and Plan: Rheumatoid arthritis involving multiple sites with positive rheumatoid factor (HCC) - Positive RF, positive anti-CCP, erosive disease with nodulosis: He has not had any recent rheumatoid arthritis flares.  He has intermittent pain and swelling of both knee joints.  He has no other joint pain or joint swelling at this time.  He has morning stiffness lasting about 10 minutes.  He has no difficulty with ADLS.  He has had to hold Enbrel for the past 3 weeks due to being sick.  He was tested for strep throat and COVID-19, which were both  negative.  He was treated with a Z-pak and his symptoms have completely resolved.  He plans on resuming Enbrel on Sunday and continuing to inject once weekly.  He does not need any refills at this time.  He was advised to notify us if he develops increased joint pain or joint swelling.  He will follow up in 3-4 months.   High  risk medication use - Enbrel 50 mg subcutaneous weekly injections.  He does not need any refills. CBC and CMP were drawn on 10/13/18.  TB gold negative on 01/01/18.  Future order for TB gold will be placed today.   He is due to update lab work in June and every 3 months.  He was advised to hold Enbrel anytime he has an infection and to resume once the infection has cleared. The importance of social distancing and following the standard precautions recommended by the CDC was discussed.   Rheumatoid nodulosis (HCC)  Primary osteoarthritis of both knees-He has intermittent pain and swelling in both knee joints.  He has no difficulty with ADLs.  He is able to climb stairs and get up from a chair without difficulty.   History of coronary artery disease - s/p CABG : Followed up by cardiology  History of chronic kidney disease -  fu by Dr.Webb.  His GFR is stable.   Follow Up Instructions: He will follow up in 3-4 months. He is due for lab work in June and every 3 months.  Standing orders for CBC and CMP are in place.  A future order for TB gold was placed today.     I discussed the assessment and treatment plan with the patient. The patient was provided an opportunity to ask questions and all were answered. The patient agreed with the plan and demonstrated an understanding of the instructions.   The patient was advised to call back or seek an in-person evaluation if the symptoms worsen or if the condition fails to improve as anticipated.  I provided 25 minutes of non-face-to-face time during this encounter.  [name], MD   Scribed by- Gearldine Bienenstockaylor M Imagine Nest, PA-C

## 2018-12-03 ENCOUNTER — Encounter: Payer: Self-pay | Admitting: Rheumatology

## 2018-12-03 ENCOUNTER — Telehealth (INDEPENDENT_AMBULATORY_CARE_PROVIDER_SITE_OTHER): Payer: 59 | Admitting: Rheumatology

## 2018-12-03 DIAGNOSIS — M0579 Rheumatoid arthritis with rheumatoid factor of multiple sites without organ or systems involvement: Secondary | ICD-10-CM | POA: Diagnosis not present

## 2018-12-03 DIAGNOSIS — E785 Hyperlipidemia, unspecified: Secondary | ICD-10-CM

## 2018-12-03 DIAGNOSIS — Z79899 Other long term (current) drug therapy: Secondary | ICD-10-CM

## 2018-12-03 DIAGNOSIS — Z8679 Personal history of other diseases of the circulatory system: Secondary | ICD-10-CM

## 2018-12-03 DIAGNOSIS — Z8639 Personal history of other endocrine, nutritional and metabolic disease: Secondary | ICD-10-CM

## 2018-12-03 DIAGNOSIS — M170 Bilateral primary osteoarthritis of knee: Secondary | ICD-10-CM

## 2018-12-03 DIAGNOSIS — Z87448 Personal history of other diseases of urinary system: Secondary | ICD-10-CM

## 2018-12-03 DIAGNOSIS — M0630 Rheumatoid nodule, unspecified site: Secondary | ICD-10-CM

## 2018-12-03 DIAGNOSIS — Z720 Tobacco use: Secondary | ICD-10-CM

## 2018-12-07 ENCOUNTER — Ambulatory Visit: Payer: 59 | Admitting: Rheumatology

## 2018-12-30 ENCOUNTER — Other Ambulatory Visit: Payer: Self-pay | Admitting: Cardiology

## 2018-12-30 MED ORDER — NITROGLYCERIN 0.4 MG SL SUBL: 0.4000 mg | SUBLINGUAL_TABLET | SUBLINGUAL | 3 refills | Status: DC | PRN

## 2019-01-10 ENCOUNTER — Telehealth: Payer: Self-pay

## 2019-01-10 NOTE — Telephone Encounter (Signed)
Sure, looks like he was on 150 mg  daily

## 2019-01-10 NOTE — Telephone Encounter (Signed)
LMOMTRC//ah

## 2019-01-10 NOTE — Telephone Encounter (Signed)
Pt requesting refill on Bupropion. Please advise.//ah

## 2019-01-11 MED ORDER — METOPROLOL SUCCINATE ER 25 MG PO TB24: 25.0000 mg | ORAL_TABLET | Freq: Every day | ORAL | 1 refills | Status: DC

## 2019-01-11 MED ORDER — BUPROPION HCL ER (SR) 150 MG PO TB12: 150.0000 mg | ORAL_TABLET | Freq: Every day | ORAL | 1 refills | Status: DC

## 2019-01-11 NOTE — Telephone Encounter (Signed)
Pt returned call....filled

## 2019-02-01 DIAGNOSIS — E1043 Type 1 diabetes mellitus with diabetic autonomic (poly)neuropathy: Secondary | ICD-10-CM | POA: Insufficient documentation

## 2019-02-10 ENCOUNTER — Other Ambulatory Visit: Payer: Self-pay | Admitting: Cardiology

## 2019-02-10 DIAGNOSIS — E7800 Pure hypercholesterolemia, unspecified: Secondary | ICD-10-CM

## 2019-02-11 ENCOUNTER — Other Ambulatory Visit: Payer: Self-pay

## 2019-02-11 LAB — LIPID PANEL
Chol/HDL Ratio: 1.9 ratio (ref 0.0–5.0)
Cholesterol, Total: 55 mg/dL — ABNORMAL LOW (ref 100–199)
HDL: 29 mg/dL — ABNORMAL LOW (ref >39)
LDL Calculated: 17 mg/dL (ref 0–99)
Triglycerides: 43 mg/dL (ref 0–149)
VLDL Cholesterol Cal: 9 mg/dL (ref 5–40)

## 2019-02-15 ENCOUNTER — Other Ambulatory Visit: Payer: Self-pay

## 2019-02-15 ENCOUNTER — Ambulatory Visit (INDEPENDENT_AMBULATORY_CARE_PROVIDER_SITE_OTHER): Payer: 59 | Admitting: Cardiology

## 2019-02-15 ENCOUNTER — Encounter: Payer: Self-pay | Admitting: Cardiology

## 2019-02-15 VITALS — BP 144/74 | HR 78 | Ht 68.0 in | Wt 195.0 lb

## 2019-02-15 DIAGNOSIS — Z951 Presence of aortocoronary bypass graft: Secondary | ICD-10-CM

## 2019-02-15 DIAGNOSIS — E1051 Type 1 diabetes mellitus with diabetic peripheral angiopathy without gangrene: Secondary | ICD-10-CM | POA: Diagnosis not present

## 2019-02-15 DIAGNOSIS — Z720 Tobacco use: Secondary | ICD-10-CM

## 2019-02-15 DIAGNOSIS — N522 Drug-induced erectile dysfunction: Secondary | ICD-10-CM

## 2019-02-15 DIAGNOSIS — I2510 Atherosclerotic heart disease of native coronary artery without angina pectoris: Secondary | ICD-10-CM

## 2019-02-15 DIAGNOSIS — E7800 Pure hypercholesterolemia, unspecified: Secondary | ICD-10-CM

## 2019-02-15 NOTE — Progress Notes (Signed)
Primary Physician:  Leanna Battles, MD   Patient ID: [name], male    DOB: Jan 06, 1966, 53 y.o.   MRN: [id number]  Subjective:    Chief Complaint  Patient presents with   Coronary Artery Disease   Follow-up    6 month   This visit type was conducted due to national recommendations for restrictions regarding the COVID-19 Pandemic (e.g. social distancing).  This format is felt to be most appropriate for this patient at this time.  All issues noted in this document were discussed and addressed.  No physical exam was performed (except for noted visual exam findings with Telehealth visits).  The patient has consented to conduct a Telehealth visit and understands insurance will be billed.   I discussed the limitations of evaluation and management by telemedicine and the availability of in person appointments. The patient expressed understanding and agreed to proceed.  Virtual Visit via Video Note is as below  I connected with Mr. [place], on 02/15/19 at 1000 by a video enabled telemedicine application and verified that I am speaking with the correct person using two identifiers.     I have discussed with her regarding the safety during COVID Pandemic and steps and precautions including social distancing with the patient.    HPI: [name]  is a 53 y.o. male  with history of Type I Diabetes, rheumatoid arthritis, hyperlipidemia, and CAD. Due to severe triple-vessel coronary artery disease he underwent coronary artery bypass grafting on 04/27/2014.   Patient is here on 6 month office visit and follow up for CAD and smoking cessation. 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 He mentions some tingling sensation in his left shoulder on occasion, does not happen with exertion. No chest pain. Due to dizziness with position changes, I had decreased his metoprolol down to half a tablet. He has further decreased it to  every other day and dizziness has improved.He is complaining of erectile dysfunction and is being seen by Urology. He has not had any syncope. Blood pressure has been stable. Reports diabetes is well controlled with last HgbA1c at 7.4%.  He is on Wellbutrin for smoking cessation that he feels has helped; however, recently has not been able to cut back any further. He has been making lifestyle changes with diet and starting to walk a few miles several times a week with his daughter.   Past Medical History:  Diagnosis Date   Anginal pain ([place])    Arthritis    RA IN HANDS   Asthma    CAD (coronary artery disease) 04/27/2014   Chronic renal disease, stage 3, moderately decreased glomerular filtration rate between 30-59 mL/min/1.73 square meter ([place]) 05/03/2014   Coronary artery disease    Diabetes ([place]) 05/03/2014   Diabetes mellitus without complication (HCC)    type 1   Hyperlipidemia    Left shoulder pain    Rheumatoid arthritis involving multiple joints ([place]) 05/03/2014   Shortness of breath    Sleep apnea    mild OSA, no CPAP   Unstable angina pectoris ([place]) 04/25/2014    Past Surgical History:  Procedure Laterality Date   CARDIAC CATHETERIZATION  04/25/2014   BY DR [place] N/A 10/30/2015   Procedure: Left Heart Cath and Cors/Grafts Angiography;  Surgeon: Adrian Prows, MD;  Location: [facility];  Service: Cardiovascular;  Laterality: N/A;   CARPAL TUNNEL  RELEASE     CORONARY ARTERY BYPASS GRAFT N/A 04/27/2014   Procedure: CORONARY ARTERY BYPASS GRAFTING on pump using left internal mammary artery to LAD coronary artery, right great saphenous vein graft to diagonal coronary artery with sequential to OM1 and circumflex coronary arteries. Right greater saphenous vein graft to posterior descending coronary artery. ;  Surgeon: Delight Ovens, MD;  Location: [facility] OR;  Service: Open Heart Surgery;  Laterality: N   ENDOVEIN HARVEST OF GREATER  SAPHENOUS VEIN Right 04/27/2014   Procedure: ENDOVEIN HARVEST OF GREATER SAPHENOUS VEIN;  Surgeon: Delight Ovens, MD;  Location: [facility] OR;  Service: Open Heart Surgery;  Laterality: Right;   EXCISION ORAL TUMOR N/A 03/01/2018   Procedure: EXCISION ORAL TUMOR;  Surgeon: Christia Reading, MD;  Location: [facility];  Service: ENT;  Laterality: N/A;   EYE SURGERY     LASER   INTRAOPERATIVE TRANSESOPHAGEAL ECHOCARDIOGRAM N/A 04/27/2014   Procedure: INTRAOPERATIVE TRANSESOPHAGEAL ECHOCARDIOGRAM;  Surgeon: Delight Ovens, MD;  Location: [facility] OR;  Service: Open Heart Surgery;  Laterality: N/A;   LEFT HEART CATHETERIZATION WITH CORONARY ANGIOGRAM N/A 04/25/2014   Procedure: LEFT HEART CATHETERIZATION WITH CORONARY ANGIOGRAM;  Surgeon: Pamella Pert, MD;  Location: [facility];  Service: Cardiovascular;  Laterality: N/A;   MOUTH SURGERY  11/15/2017   tongue surgery     Social History   Socioeconomic History   Marital status: Divorced    Spouse name: Not on file   Number of children: 3   Years of education: Not on file   Highest education level: Not on file  Occupational History   Not on file  Social Needs   Financial resource strain: Not on file   Food insecurity    Worry: Not on file    Inability: Not on file   Transportation needs    Medical: Not on file    Non-medical: Not on file  Tobacco Use   Smoking status: Current Every Day Smoker    Packs/day: 0.50    Years: 34.00    Pack years: 17.00    Types: Cigarettes   Smokeless tobacco: Never Used   Tobacco comment: 10 per day   Substance and Sexual Activity   Alcohol use: Yes    Alcohol/week: 0.0 standard drinks    Comment: RARE   Drug use: No   Sexual activity: Not on file  Lifestyle   Physical activity    Days per week: Not on file    Minutes per session: Not on file   Stress: Not on file  Relationships   Social connections    Talks on phone: Not on file    Gets together: Not on file     Attends religious service: Not on file    Active member of club or organization: Not on file    Attends meetings of clubs or organizations: Not on file    Relationship status: Not on file   Intimate partner violence    Fear of current or ex partner: Not on file    Emotionally abused: Not on file    Physically abused: Not on file    Forced sexual activity: Not on file  Other Topics Concern   Not on file  Social History Narrative   Not on file    Review of Systems  Constitution: Negative for decreased appetite, malaise/fatigue, weight gain and weight loss.  Eyes: Negative for visual disturbance.  Cardiovascular: Positive for dyspnea on exertion. Negative for chest pain, claudication,  leg swelling, orthopnea, palpitations and syncope.  Respiratory: Negative for hemoptysis and wheezing.   Endocrine: Negative for cold intolerance and heat intolerance.  Hematologic/Lymphatic: Does not bruise/bleed easily.  Skin: Negative for nail changes.  Musculoskeletal: Positive for joint pain. Negative for muscle weakness and myalgias.  Gastrointestinal: Positive for bloating and constipation. Negative for abdominal pain, change in bowel habit, nausea and vomiting.  Neurological: Positive for dizziness. Negative for difficulty with concentration, focal weakness and headaches.  Psychiatric/Behavioral: Negative for altered mental status and suicidal ideas.  All other systems reviewed and are negative.     Objective:  Blood pressure (!) 144/74, pulse 78, height 5\' 8"  (1.727 m), weight 195 lb (88.5 kg). Body mass index is 29.65 kg/m.    Physical exam not performed or limited due to virtual visit.  Patient appeared to be in no distress, Neck was supple, respiration was not labored.  Please see exam details from prior visit is as below.   Physical Exam  Constitutional: He is oriented to person, place, and time. Vital signs are normal. He appears well-developed and well-nourished.  HENT:  Head:  Normocephalic and atraumatic.  Neck: Normal range of motion.  Cardiovascular: Normal rate, regular rhythm, normal heart sounds and intact distal pulses.  Pulses:      Femoral pulses are 2+ on the right side and 2+ on the left side.      Popliteal pulses are 2+ on the right side and 2+ on the left side.       Dorsalis pedis pulses are 1+ on the right side and 1+ on the left side.       Posterior tibial pulses are 1+ on the right side and 1+ on the left side.  Pulmonary/Chest: Effort normal and breath sounds normal. No accessory muscle usage. No respiratory distress.  Abdominal: Soft. Bowel sounds are normal.  Musculoskeletal: Normal range of motion.  Neurological: He is alert and oriented to person, place, and time.  Skin: Skin is warm and dry.  Vitals reviewed.  Radiology: No results found.  Laboratory examination:    CMP Latest Ref Rng & Units 10/13/2018 07/05/2018 03/15/2018  Glucose 65 - 99 mg/dL 981(X200(H) 914(N118(H) 829(F160(H)  BUN 7 - 25 mg/dL 12 12 11   Creatinine 0.70 - 1.33 mg/dL 6.21(H1.51(H) 0.86(V1.39(H) 7.84(O1.44(H)  Sodium 135 - 146 mmol/L 138 138 139  Potassium 3.5 - 5.3 mmol/L 5.2 4.8 4.3  Chloride 98 - 110 mmol/L 103 103 106  CO2 20 - 32 mmol/L 29 29 24   Calcium 8.6 - 10.3 mg/dL 9.2 9.6 9.6(E8.4(L)  Total Protein 6.1 - 8.1 g/dL 6.5 7.0 7.0  Total Bilirubin 0.2 - 1.2 mg/dL 0.4 0.4 0.3  Alkaline Phos 38 - 126 U/L - - 97  AST 10 - 35 U/L 19 19 17   ALT 9 - 46 U/L 19 20 17    CBC Latest Ref Rng & Units 10/13/2018 07/05/2018 03/15/2018  WBC 3.8 - 10.8 Thousand/uL 9.0 10.2 11.3(H)  Hemoglobin 13.2 - 17.1 g/dL 95.214.4 84.114.1 32.413.2  Hematocrit 38.5 - 50.0 % 42.6 41.0 39.8  Platelets 140 - 400 Thousand/uL 234 238 199   Lipid Panel     Component Value Date/Time   CHOL 55 (L) 02/10/2019 0907   TRIG 43 02/10/2019 0907   HDL 29 (L) 02/10/2019 0907   CHOLHDL 1.9 02/10/2019 0907   LDLCALC 17 02/10/2019 0907   HEMOGLOBIN A1C Lab Results  Component Value Date   HGBA1C 7.4 (H) 04/25/2014   MPG 166 (H)  04/25/2014  TSH No results for input(s): TSH in the last 8760 hours.  PRN Meds:. There are no discontinued medications. Current Meds  Medication Sig   Apoaequorin (PREVAGEN PO) Take by mouth.   Ascorbic Acid (VITAMIN C) 1000 MG tablet Take 1,000 mg by mouth daily.   aspirin 81 MG tablet Take 81 mg by mouth daily.   buPROPion (WELLBUTRIN SR) 150 MG 12 hr tablet Take 1 tablet (150 mg total) by mouth daily.   Cholecalciferol (VITAMIN D3) 1.25 MG (50000 UT) CAPS Take by mouth.   Cod Liver Oil OIL Take by mouth.   COD LIVER OIL PO Take 650 mg by mouth daily.    Cyanocobalamin (VITAMIN B-12 PO) Take by mouth daily.   DOXYCYCLINE PO Take by mouth 2 (two) times daily.   etanercept (ENBREL SURECLICK) 50 MG/ML injection INJECT 50MG  SUBCUTANEOUSLY  EVERY WEEK   Insulin Glargine (BASAGLAR KWIKPEN [place]) Inject into the skin. 19 units am 19 units pm   insulin lispro (HUMALOG) 100 UNIT/ML injection Inject 4-17 Units into the skin See admin instructions. Inject 3-4 times daily with meals per sliding scale instructions on glucose meter: 1 unit for every 10 grams of carbs and CBG calculations   magnesium gluconate (MAGONATE) 500 MG tablet Take 500 mg by mouth 2 (two) times daily.   metoprolol succinate (TOPROL-XL) 25 MG 24 hr tablet Take 1 tablet (25 mg total) by mouth daily. (Patient taking differently: Take 12.5 mg by mouth every other day. )   nitroGLYCERIN (NITROSTAT) 0.4 MG SL tablet Place 1 tablet (0.4 mg total) under the tongue every 5 (five) minutes as needed for chest pain.   Probiotic Product (PROBIOTIC PO) Take 1 tablet by mouth daily.   REPATHA SURECLICK 140 MG/ML SOAJ Inject 140 mg as directed every 14 (fourteen) days.   tadalafil (CIALIS) 20 MG tablet Take 20 mg by mouth daily as needed for erectile dysfunction.    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 with heteregenous plaque. Antegrade  vertebral artery flow. No significant change from 07/16/2017. Follow up in one year is appropriate if clinically indicated.  Coronary angiogram 10/30/2015: Normal LVEF, 55%. Mid LAD occluded after D1. LIMA to LAD, SVG to D1 patent. Circumflex occluded in the midsegment after OM1. SVG to OM2 patent, sequential branch to his to circumflex occluded. Distal circumflex filled by collaterals. Mid RCA 90%, distal RCA 90%. SVG to RCA patent.  Lexiscan sestamibi stress test 10/12/2015: 1. The resting electrocardiogram demonstrated normal sinus rhythm, normal resting conduction, no resting arrhythmias and T changes. Stress EKG is non-diagnostic for ischemia as it a pharmacologic stress using Lexiscan. Stress test converted to Lexiscan due to exerise intolerence and dyspnea. 2. The perfusion imaging study demonstrates a medium size severe ischemia in the inferior and inferolateral wall extending from the base towards the apex. Left ventricular systolic function was normal with ejection fraction of 69%. This is a high risk study, consider further cardiac work-up.  CABG 04/27/2014: LIMA to LAD, SVG to D1, SVG to OM1 and distal circumflex, SVG to PDA. Dr. Ofilia NeasEd Gerhardt.  Echocardiogram 06/08/2014: - Left ventricle cavity is normal in size. Moderate concentric hypertrophy of the left ventricle. Normal global wall motion. Visual EF is 60-65%. Calculated EF 56%. - Trileaflet aortic valve with no regurgitation noted. Mild calcification of the aortic valve annulus. - Mild mitral regurgitation. Mild calcification of the mitral valve annulus. - Mild tricuspid regurgitation. No evidence of pulmonary hypertension.  Assessment:  ICD-10-CM   1. Coronary artery disease involving native coronary artery of native heart without angina pectoris  I25.10   2. S/P CABG x 5  Z95.1   3. Pure hypercholesterolemia  E78.00   4. Controlled type 1 diabetes mellitus with diabetic peripheral angiopathy without gangrene (HCC)   E10.51   5. Tobacco abuse  Z72.0   6. Drug-induced erectile dysfunction  N52.2      EKG 08/17/2018: Normal sinus rhythm at rate of 66 bpm, normal axis. No evidence of ischemia. Normal EKG. No change from EKG 02/16/2018   Recommendations:   Patient is presently doing well, no symptoms of angina.  Feel that his left shoulder and sensation likely related to musculoskeletal etiology.  No symptoms suggestive of heart failure.  I have reviewed his recent lipid panel, lipids are very well controlled with Repatha.  Dizziness has improved with further decreasing his dose of metoprolol; however, he is now complaining of erectile dysfunction and questions if this is related to his metoprolol.  I will discontinue metoprolol and switch to Bystolic to see if this helps with his symptoms.  We will starting on low-dose and may uptitrate as tolerated.  Blood pressure is slightly elevated today, but generally overall stable.  He has been monitoring at home.  He states that his diabetes is well controlled.  Unfortunately he does continue to smoke half pack per day, continues to be on Wellbutrin that has been able to help him cut back to half a pack from 1 full pack.  He states that he is still working on this and understands the importance of quitting smoking.  I am very proud of his lifestyle changes that he is recently made including diet and regular exercise and have encouraged him to continue the same.  I will see him back in 3 months for follow-up on hypertension with changes to his medications.  Toniann Fail, MSN, APRN, FNP-C [facility] Office: [phone] Fax: [phone]

## 2019-02-17 ENCOUNTER — Encounter: Payer: Self-pay | Admitting: Cardiology

## 2019-02-17 MED ORDER — NEBIVOLOL HCL 5 MG PO TABS: 5.0000 mg | ORAL_TABLET | Freq: Every day | ORAL | Status: DC

## 2019-02-21 ENCOUNTER — Other Ambulatory Visit: Payer: Self-pay

## 2019-02-21 MED ORDER — REPATHA SURECLICK 140 MG/ML ~~LOC~~ SOAJ: 140.0000 mg | SUBCUTANEOUS | 3 refills | Status: DC

## 2019-03-02 ENCOUNTER — Other Ambulatory Visit: Payer: Self-pay

## 2019-03-02 MED ORDER — NEBIVOLOL HCL 5 MG PO TABS: 5.0000 mg | ORAL_TABLET | Freq: Every day | ORAL | 3 refills | Status: DC

## 2019-03-08 ENCOUNTER — Telehealth: Payer: Self-pay | Admitting: *Deleted

## 2019-03-08 NOTE — Telephone Encounter (Signed)
Patient advised received fax from [facility] stating they have tried to reach patient multiple times to refill Enbrel. They have been unable to reach patient. Call back number [phone]. Patient will contact pharmacy.

## 2019-03-08 NOTE — Telephone Encounter (Signed)
Received fax from [place] stating they have tried to reach patient multiple times to refill Enbrel. They have been unable to reach patient. Call back number [phone].

## 2019-03-21 ENCOUNTER — Encounter: Payer: Self-pay | Admitting: Rheumatology

## 2019-03-21 ENCOUNTER — Telehealth: Payer: Self-pay

## 2019-03-21 ENCOUNTER — Telehealth: Payer: Self-pay | Admitting: Rheumatology

## 2019-03-21 ENCOUNTER — Other Ambulatory Visit: Payer: Self-pay

## 2019-03-21 ENCOUNTER — Ambulatory Visit: Payer: 59 | Admitting: Rheumatology

## 2019-03-21 VITALS — BP 117/66 | HR 58 | Resp 15 | Ht 68.0 in | Wt 193.0 lb

## 2019-03-21 DIAGNOSIS — Z79899 Other long term (current) drug therapy: Secondary | ICD-10-CM

## 2019-03-21 DIAGNOSIS — M0579 Rheumatoid arthritis with rheumatoid factor of multiple sites without organ or systems involvement: Secondary | ICD-10-CM

## 2019-03-21 DIAGNOSIS — M0630 Rheumatoid nodule, unspecified site: Secondary | ICD-10-CM | POA: Diagnosis not present

## 2019-03-21 DIAGNOSIS — Z720 Tobacco use: Secondary | ICD-10-CM

## 2019-03-21 DIAGNOSIS — Z87448 Personal history of other diseases of urinary system: Secondary | ICD-10-CM

## 2019-03-21 DIAGNOSIS — M170 Bilateral primary osteoarthritis of knee: Secondary | ICD-10-CM | POA: Diagnosis not present

## 2019-03-21 DIAGNOSIS — E785 Hyperlipidemia, unspecified: Secondary | ICD-10-CM

## 2019-03-21 DIAGNOSIS — Z8639 Personal history of other endocrine, nutritional and metabolic disease: Secondary | ICD-10-CM

## 2019-03-21 DIAGNOSIS — Z8679 Personal history of other diseases of the circulatory system: Secondary | ICD-10-CM

## 2019-03-21 MED ORDER — PREDNISONE 5 MG PO TABS: ORAL_TABLET | ORAL | 0 refills | Status: DC

## 2019-03-21 MED ORDER — ENBREL SURECLICK 50 MG/ML ~~LOC~~ SOAJ: SUBCUTANEOUS | 0 refills | Status: DC

## 2019-03-21 NOTE — Patient Instructions (Signed)
Standing Labs We placed an order today for your standing lab work.    Please come back and get your standing labs in 2 weeks x2, 2 months and then every 3 months.   We have open lab daily Monday through Thursday from 8:30-12:30 PM and 1:30-4:30 PM and Friday from 8:30-12:30 PM and 1:30 -4:00 PM at the office of Dr. Pollyann Savoy.   You may experience shorter wait times on Monday and Friday afternoons. The office is located at [address], [place], [place] [address] No appointment is necessary.   Labs are drawn by [facility].  You may receive a bill from [facility] for your lab work.  If you wish to have your labs drawn at another location, please call the office 24 hours in advance to send orders.  If you have any questions regarding directions or hours of operation,  please call [phone].   Just as a reminder please drink plenty of water prior to coming for your lab work. Thanks!   Leflunomide tablets What is this medicine? LEFLUNOMIDE (le FLOO na mide) is for rheumatoid arthritis. This medicine may be used for other purposes; ask your health care provider or pharmacist if you have questions. COMMON BRAND NAME(S): Arava What should I tell my health care provider before I take this medicine? They need to know if you have any of these conditions:  diabetes  have a fever or infection  high blood pressure  immune system problems  kidney disease  liver disease  low blood cell counts, like low white cell, platelet, or red cell counts  lung or breathing disease, like asthma  recently received or scheduled to receive a vaccine  receiving treatment for cancer  skin conditions or sensitivity  tingling of the fingers or toes, or other nerve disorder  tuberculosis  an unusual or allergic reaction to leflunomide, teriflunomide, other medicines, food, dyes, or preservatives  pregnant or trying to get pregnant  breast-feeding How should I use this  medicine? Take this medicine by mouth with a full glass of water. Follow the directions on the prescription label. Take your medicine at regular intervals. Do not take your medicine more often than directed. Do not stop taking except on your doctor's advice. Talk to your pediatrician regarding the use of this medicine in children. Special care may be needed. Overdosage: If you think you have taken too much of this medicine contact a poison control center or emergency room at once. NOTE: This medicine is only for you. Do not share this medicine with others. What if I miss a dose? If you miss a dose, take it as soon as you can. If it is almost time for your next dose, take only that dose. Do not take double or extra doses. What may interact with this medicine? Do not take this medicine with any of the following medications:  teriflunomide This medicine may also interact with the following medications:  alosetron  birth control pills  caffeine  cefaclor  certain medicines for diabetes like nateglinide, repaglinide, rosiglitazone, pioglitazone  certain medicines for high cholesterol like atorvastatin, pravastatin, rosuvastatin, simvastatin  charcoal  cholestyramine  ciprofloxacin  duloxetine  furosemide  ketoprofen  live virus vaccines  medicines that increase your risk for infection  methotrexate  mitoxantrone  paclitaxel  penicillin  theophylline  tizanidine  warfarin This list may not describe all possible interactions. Give your health care provider a list of all the medicines, herbs, non-prescription drugs, or dietary supplements you use.  Also tell them if you smoke, drink alcohol, or use illegal drugs. Some items may interact with your medicine. What should I watch for while using this medicine? Visit your health care provider for regular checks on your progress. Tell your doctor or health care provider if your symptoms do not start to get better or if they  get worse. You may need blood work done while you are taking this medicine. This medicine may cause serious skin reactions. They can happen weeks to months after starting the medicine. Contact your health care provider right away if you notice fevers or flu-like symptoms with a rash. The rash may be red or purple and then turn into blisters or peeling of the skin. Or, you might notice a red rash with swelling of the face, lips or lymph nodes in your neck or under your arms. This medicine may stay in your body for up to 2 years after your last dose. Tell your doctor about any unusual side effects or symptoms. A medicine can be given to help lower your blood levels of this medicine more quickly. Women must use effective birth control with this medicine. There is a potential for serious side effects to an unborn child. Do not become pregnant while taking this medicine. Inform your doctor if you wish to become pregnant. This medicine remains in your blood after you stop taking it. You must continue using effective birth control until the blood levels have been checked and they are low enough. A medicine can be given to help lower your blood levels of this medicine more quickly. Immediately talk to your doctor if you think you may be pregnant. You may need a pregnancy test. Talk to your health care provider or pharmacist for more information. You should not receive certain vaccines during your treatment and for a certain time after your treatment with this medication ends. Talk to your health care provider for more information. What side effects may I notice from receiving this medicine? Side effects that you should report to your doctor or health care professional as soon as possible:  allergic reactions like skin rash, itching or hives, swelling of the face, lips, or tongue  breathing problems  cough  increased blood pressure  low blood counts - this medicine may decrease the number of white blood cells  and platelets. You may be at increased risk for infections and bleeding.  pain, tingling, numbness in the hands or feet  rash, fever, and swollen lymph nodes  redness, blistering, peeing or loosening of the skin, including inside the mouth  signs of decreased platelets or bleeding - bruising, pinpoint red spots on the skin, black, tarry stools, blood in urine  signs of infection - fever or chills, cough, sore throat, pain or trouble passing urine  signs and symptoms of liver injury like dark yellow or brown urine; general ill feeling or flu-like symptoms; light-colored stools; loss of appetite; nausea; right upper belly pain; unusually weak or tired; yellowing of the eyes or skin  trouble passing urine or change in the amount of urine  vomiting Side effects that usually do not require medical attention (report to your doctor or health care professional if they continue or are bothersome):  diarrhea  hair thinning or loss  headache  nausea  tiredness This list may not describe all possible side effects. Call your doctor for medical advice about side effects. You may report side effects to FDA at 1-800-FDA-1088. Where should I keep my medicine? Keep  out of the reach of children. Store at room temperature between 15 and 30 degrees C (59 and 86 degrees F). Protect from moisture and light. Throw away any unused medicine after the expiration date. NOTE: This sheet is a summary. It may not cover all possible information. If you have questions about this medicine, talk to your doctor, pharmacist, or health care provider.  2020 Elsevier/Gold Standard (2018-10-22 15:06:48)

## 2019-03-21 NOTE — Telephone Encounter (Signed)
Patient given a sooner appointment for today at 11:30 am.

## 2019-03-21 NOTE — Telephone Encounter (Signed)
Patient called stating he has pain and swelling in his left hand.  Patient states the swelling began approximately two days ago.  Patient scheduled an appointment with Dr. Estanislado Pandy for tomorrow 03/22/19 at 10:15 am.  Patient states he would like to email or text a picture of his swollen hand to the office and is requesting a return call.

## 2019-03-21 NOTE — Progress Notes (Signed)
Medication Samples have been provided to the patient.  Drug name: enbrel   Strength: 50mg         Qty: 1 LOT: [id number]  Exp.Date: 04/2020  Dosing instructions: Inject 50mg  into the skin every 7 days.   The patient has been instructed regarding the correct time, dose, and frequency of taking this medication, including desired effects and most common side effects.   Earnestine Mealing 12:47 PM 03/21/2019

## 2019-03-21 NOTE — Telephone Encounter (Signed)
Patient in office today for appointment/labs. When labs result, please send in [place]. Dose will be Arava 10 mg po daily for 2 weeks and if labs are stable at that time he will increase to 20 mg po daily.  Thanks!

## 2019-03-21 NOTE — Progress Notes (Signed)
Office Visit Note  Patient: [name]             Date of Birth: 1966-04-27           MRN: [id number]             PCP: Leanna Battles, MD Referring: Leanna Battles, MD Visit Date: 03/21/2019 Occupation: @GUAROCC @  Subjective:  Pain in left hand   History of Present Illness: [name] is a 53 y.o. male with history of seropositive rheumatoid arthritis and osteoarthritis.  He is on Enbrel 50 mg sq weekly injections.  He missed his dose of Enbrel yesterday due to needing a refill.  He started having a flare of olecranon bursitis in the left elbow 1 week ago, which resolved in about 3 days.  He states the following day he develop pain and swelling in the left hand.   He denies any overuse activities.  He has been under increased stress recently. He has been taking tylenol for pain relief.    Activities of Daily Living:  Patient reports morning stiffness for 30  minutes.   Patient Reports nocturnal pain.  Difficulty dressing/grooming: Denies Difficulty climbing stairs: Denies Difficulty getting out of chair: Denies Difficulty using hands for taps, buttons, cutlery, and/or writing: Denies  Review of Systems  Constitutional: Positive for fatigue. Negative for night sweats.  HENT: Positive for mouth dryness. Negative for mouth sores and nose dryness.   Eyes: Negative for redness and dryness.  Respiratory: Negative for cough, hemoptysis, shortness of breath and difficulty breathing.   Cardiovascular: Negative for chest pain, palpitations, hypertension, irregular heartbeat and swelling in legs/feet.  Gastrointestinal: Positive for constipation. Negative for blood in stool and diarrhea.  Endocrine: Negative for increased urination.  Genitourinary: Negative for painful urination.  Musculoskeletal: Positive for arthralgias, joint pain, joint swelling and morning stiffness. Negative for myalgias, muscle weakness, muscle tenderness and myalgias.  Skin: Negative for color  change, rash, hair loss, nodules/bumps, skin tightness, ulcers and sensitivity to sunlight.  Allergic/Immunologic: Negative for susceptible to infections.  Neurological: Negative for dizziness, fainting, memory loss, night sweats and weakness.  Hematological: Negative for swollen glands.  Psychiatric/Behavioral: Negative for depressed mood and sleep disturbance. The patient is not nervous/anxious.     PMFS History:  Patient Active Problem List   Diagnosis Date Noted  . Chronic venous insufficiency 10/05/2018  . Controlled type 1 diabetes mellitus with diabetic peripheral angiopathy without gangrene ([place]) 08/14/2017  . Dyslipidemia 03/26/2017  . High risk medication use 09/23/2016  . History of hypertension 09/23/2016  . History of chronic kidney disease 09/23/2016  . History of coronary artery disease 09/23/2016  . Tobacco abuse 09/23/2016  . Primary osteoarthritis of both knees 09/23/2016  . History of diabetes mellitus 09/23/2016  . 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 Chest pain with high risk for cardiac etiology 10/29/2015  . Asymptomatic bilateral carotid artery stenosis 06/08/2014  . Pleural effusion, bilateral 06/03/2014  . Dyspnea 06/01/2014  . Diabetes ([place]) 05/03/2014  . Rheumatoid arthritis involving multiple joints ([place]) 05/03/2014  . 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 Chronic renal disease, stage 3, moderately decreased glomerular filtration rate between 30-59 mL/min/1.73 square meter ([place]) 05/03/2014  . CAD (coronary artery disease) 04/27/2014    Past Medical History:  Diagnosis Date  . Anginal pain ([place])   . Arthritis    RA IN HANDS  . Asthma   . CAD (coronary artery disease) 04/27/2014  . Chronic renal disease, stage 3, moderately decreased glomerular  filtration rate between 30-59 mL/min/1.73 square meter (HCC) 05/03/2014  . Coronary artery disease   . Diabetes (HCC) 05/03/2014  . Diabetes mellitus without complication (HCC)    type 1  . Hyperlipidemia   . Left  shoulder pain   . 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/03/2014  . Shortness of breath   . Sleep apnea    mild OSA, no CPAP  . Unstable angina pectoris (HCC) 04/25/2014    Family History  Problem Relation Age of Onset  . Prostate cancer Father   . Heart disease Father   . Stomach cancer Mother   . Heart disease Brother   . Asthma Daughter   . Healthy Daughter    Past Surgical History:  Procedure Laterality Date  . CARDIAC CATHETERIZATION  04/25/2014   BY DR Jacinto HalimGANJI  . CARDIAC CATHETERIZATION N/A 10/30/2015   Procedure: Left Heart Cath and Cors/Grafts Angiography;  Surgeon: Yates DecampJay Ganji, MD;  [facility];  Service: Cardiovascular;  Laterality: N/A;  . CARPAL TUNNEL RELEASE    . CORONARY ARTERY BYPASS GRAFT N/A 04/27/2014   Procedure: CORONARY ARTERY BYPASS GRAFTING on pump using left internal mammary artery to LAD coronary artery, right great saphenous vein graft to diagonal coronary artery with sequential to OM1 and circumflex coronary arteries. Right greater saphenous vein graft to posterior descending coronary artery. ;  Surgeon: Delight OvensEdward B Gerhardt, MD;  [facility];  Service: Open Heart Surgery;  Laterality: N  . ENDOVEIN HARVEST OF GREATER SAPHENOUS VEIN Right 04/27/2014   Procedure: ENDOVEIN HARVEST OF GREATER SAPHENOUS VEIN;  Surgeon: Delight OvensEdward B Gerhardt, MD;  Location: [facility] OR;  Service: Open Heart Surgery;  Laterality: Right;  . EXCISION ORAL TUMOR N/A 03/01/2018   Procedure: EXCISION ORAL TUMOR;  Surgeon: Christia ReadingBates, Dwight, MD;  Location:  SURGERY CENTER;  Service: ENT;  Laterality: N/A;  . EYE SURGERY     LASER  . INTRAOPERATIVE TRANSESOPHAGEAL ECHOCARDIOGRAM N/A 04/27/2014   Procedure: INTRAOPERATIVE TRANSESOPHAGEAL ECHOCARDIOGRAM;  Surgeon: Delight OvensEdward B Gerhardt, MD;  [facility];  Service: Open Heart Surgery;  Laterality: N/A;  . LEFT HEART CATHETERIZATION WITH CORONARY ANGIOGRAM N/A 04/25/2014   Procedure: LEFT HEART CATHETERIZATION WITH  CORONARY ANGIOGRAM;  Surgeon: Pamella PertJagadeesh R Ganji, MD;  [facility];  Service: Cardiovascular;  Laterality: N/A;  . MOUTH SURGERY  11/15/2017   tongue surgery    Social History   Social History Narrative  . Not on file    There is no immunization history on file for this patient.   Objective: Vital Signs: BP 117/66 (BP Location: Right Arm, Patient Position: Sitting, Cuff Size: Normal)   Pulse (!) 58   Resp 15   Ht 5\' 8"  (1.727 m)   Wt 193 lb (87.5 kg)   BMI 29.35 kg/m    Physical Exam Vitals signs and nursing note reviewed.  Constitutional:      Appearance: He is well-developed.  HENT:     Head: Normocephalic and atraumatic.  Eyes:     Conjunctiva/sclera: Conjunctivae normal.     Pupils: Pupils are equal, round, and reactive to light.  Neck:     Musculoskeletal: Normal range of motion and neck supple.  Cardiovascular:     Rate and Rhythm: Normal rate and regular rhythm.     Heart sounds: Normal heart sounds.  Pulmonary:     Effort: Pulmonary effort is normal.     Breath sounds: Normal breath sounds.  Abdominal:     General: Bowel sounds are  normal.     Palpations: Abdomen is soft.  Skin:    General: Skin is warm and dry.     Capillary Refill: Capillary refill takes less than 2 seconds.  Neurological:     Mental Status: He is alert and oriented to person, place, and time.  Psychiatric:        Behavior: Behavior normal.      Musculoskeletal Exam: C-spine, thoracic spine, lumbar spine good range of motion.  No midline spinal tenderness.  Shoulders, elbows, wrist joints have good range of motion with no discomfort.  He has synovitis and tenderness of the left second, third, fourth, and fifth MCP joints.  He has PIP and DIP synovial thickening consistent with osteoarthritis of bilateral hands.  Hip joints, knee joints, ankle joints, MTPs, PIPs and DIPs good range of motion no synovitis.  No warmth or effusion of bilateral knee joints.  No tenderness or swelling  of ankle joints  CDAI Exam: CDAI Score: 10  Patient Global: 5 mm; Provider Global: 5 mm Swollen: 4 ; Tender: 5  Joint Exam      Right  Left  Elbow      Tender  MCP 2     Swollen Tender  MCP 3     Swollen Tender  MCP 4     Swollen Tender  MCP 5     Swollen Tender     Investigation: No additional findings.  Imaging: No results found.  Recent Labs: Lab Results  Component Value Date   WBC 9.0 10/13/2018   HGB 14.4 10/13/2018   PLT 234 10/13/2018   NA 138 10/13/2018   K 5.2 10/13/2018   CL 103 10/13/2018   CO2 29 10/13/2018   GLUCOSE 200 (H) 10/13/2018   BUN 12 10/13/2018   CREATININE 1.51 (H) 10/13/2018   BILITOT 0.4 10/13/2018   ALKPHOS 97 03/15/2018   AST 19 10/13/2018   ALT 19 10/13/2018   PROT 6.5 10/13/2018   ALBUMIN 3.7 03/15/2018   CALCIUM 9.2 10/13/2018   GFRAA 60 10/13/2018   QFTBGOLDPLUS NEGATIVE 01/01/2018    Speciality Comments: No specialty comments available.  Procedures:  No procedures performed Allergies: Morphine and related   Assessment / Plan:     Visit Diagnoses: Rheumatoid arthritis involving multiple sites with positive rheumatoid factor (HCC) -He has tenderness and synovitis of the left 2nd, 3rd, 4th, and 5th MCP joints.  He continues to have recurrent olecranon bursitis bilaterally.  He has had aspirations and cortisone injections in the past.  He is injecting Enbrel 50 mg subcutaneously every week.  He has failed Humira in the past.  His creatinine was 1.51 and GFR was 52 on 10/13/2018 so we do not want to add methotrexate to his current treatment regimen.  He has uncontrolled diabetes and is on Insulin, so we will start him on a very low dose prednisone taper.  He will start at 10 mg and taper by 2.5 mg every 4 days.  We will add on Arava 10 mg po daily for 2 weeks and if labs are stable at that time he will increase to 20 mg po daily.  Indications, contraindications, and potential side effects of Arava were discussed. He will continue on  Enbrel as prescribed.  A refill of Enbrel was sent to the pharmacy today.  He will follow up in 3 months.   Medication counseling:   Baseline Immunosuppressant Therapy Labs  Quantiferon TB Gold Latest Ref Rng & Units 01/01/2018  Quantiferon TB Gold  Plus NEGATIVE NEGATIVE   Patient was counseled on the purpose, proper use, and adverse effects of leflunomide including risk of infection, nausea/diarrhea/weight loss, increase in blood pressure, rash, hair loss, tingling in the hands and feet, and signs and symptoms of interstitial lung disease.   Also counseled on Black Box warning of liver injury and importance of avoiding alcohol while on therapy. Discussed that there is the possibility of an increased risk of malignancy but it is not well understood if this increased risk is due to the medication or the disease state.  Counseled patient to avoid live vaccines. Recommend annual influenza, Pneumovax 23, Prevnar 13, and Shingrix as indicated.   Discussed the importance of frequent monitoring of liver function and blood count.  Standing orders placed.  Discussed importance of birth control while on leflunomide due to risk of congenital abnormalities, and patient confirms he understands. Provided patient with educational materials on leflunomide and answered all questions.  Patient consented to [place] use, and consent will be uploaded into the media tab.   Patient dose will be 10 mg po daily for 2 weeks and if labs are stable he will increase to 20 mg po daily.  Prescription pending lab results and/or insurance approval.  Rheumatoid nodulosis (HCC) -He has no new nodules.  High risk medication use - Enbrel 50 mg sq weekly injections and Arava.  He failed Humira in the past. Creatinine was 1.51 and GFR was 52 on 10/13/18-he will not be a good candidate for adding on MTX.  He will update CBC and CMP today to monitor for drug toxicity.  He will return in November and every 3 months for lab work.  He was advised  to hold Enbrel anytime he has signs or symptoms of an infection and to resume once the infection is completely cleared.  Primary osteoarthritis of both knees - He has intermittent discomfort in bilateral knee joints.  No warmth or effusion was noted on exam today.  He has bilateral knee crepitus.  Other medical conditions are listed as follows:   History of coronary artery disease   History of chronic kidney disease   History of hypertension   History of diabetes mellitus  Dyslipidemia   Tobacco abuse  Orders: Orders Placed This Encounter  Procedures  . COMPLETE METABOLIC PANEL WITH GFR  . CBC with Differential/Platelet  . QuantiFERON-TB Gold Plus   Meds ordered this encounter  Medications  . predniSONE (DELTASONE) 5 MG tablet    Sig: Take 2 tablets by mouth daily x4 days, 1.5 tablet by mouth daily x4 days, 1 tablet by mouth daily x4 days, half tablet by mouth daily x4 days    Dispense:  20 tablet    Refill:  0    Face-to-face time spent with patient was 30 minutes. Greater than 50% of time was spent in counseling and coordination of care.  Follow-Up Instructions: Return in about 3 months (around 06/21/2019) for Rheumatoid arthritis, Osteoarthritis.   Gearldine Bienenstock, PA-C   I examined and evaluated the patient with Sherron Ales PA.  Patient was having a flare today with pain and swelling in multiple joints.  He has synovitis in his left hand with swelling over MCP joints.  He also had bilateral olecranon bursitis.  He states he can adjust insulin dosage if he give him low-dose prednisone.  We will started him on 10 mg and will taper by 2.5 mg every 4 days.  Different treatment options and their side effects were discussed.  Patient believes that he may have diverticulitis.  The choices will be limited.  At this point I would like to continue Enbrel and add Arava to it.  Ranae Plumber will be added once the labs are available.  Indication side effects contraindications were discussed at  length.  Consent was taken..  The plan of care was discussed as noted above.  Pollyann Savoy, MD  Note - This record has been created using Animal nutritionist.  Chart creation errors have been sought, but may not always  have been located. Such creation errors do not reflect on  the standard of medical care.

## 2019-03-21 NOTE — Telephone Encounter (Signed)
Patient states Friday evening he notice swelling in his hand that has been getting worse. Patient does not recall an injury. Patient denies loss of color, numbness or sensation. Patient states it is warm/ to the touch. Patient has scheduled an appointment for evaluation 03/22/19.

## 2019-03-22 ENCOUNTER — Ambulatory Visit: Payer: 59 | Admitting: Rheumatology

## 2019-03-22 MED ORDER — LEFLUNOMIDE 10 MG PO TABS: ORAL_TABLET | ORAL | 0 refills | Status: DC

## 2019-03-22 NOTE — Telephone Encounter (Signed)
Patient transferred to my desk as he had questions about the side effects of the medications.  Patient was counseled on the purpose, proper use, and adverse effects of leflunomide including risk of infection, nausea/diarrhea/weight loss, increase in blood pressure, rash, hair loss, tingling in the hands and feet, and signs and symptoms of interstitial lung disease.  Recommend taking with food to decrease risk of GI upset.  Also counseled on Black Box warning of liver injury and importance of avoiding alcohol while on therapy. He will return for labs in 2 weeks to monitor for drug toxicity.  Prescription sent to his local pharmacy by Gwenlyn Perking, LPN.  She also reviewed lab results with patient.  All questions encouraged and answered.  Instructed to call with any other questions or concerns.   Mariella Saa, PharmD, [place], [place] Clinical Specialty Pharmacist [phone]  03/22/2019 4:44 PM

## 2019-03-22 NOTE — Progress Notes (Signed)
Glucose is elevated-227.  Please notify patient.  Creatinine elevated but stable-1.43 and GFR is 55.  Rest of CMP WNL.   WBC count is borderline elevated-11.3.  please notify patient and ask if he has had any recent infections.  We will continue to monitor lab work.  He will be starting on [place] and will need to update labs 2 weeks after starting on Arava.

## 2019-03-23 DIAGNOSIS — Z7251 High risk heterosexual behavior: Secondary | ICD-10-CM | POA: Insufficient documentation

## 2019-03-23 LAB — COMPLETE METABOLIC PANEL WITH GFR
AG Ratio: 1.5 (calc) (ref 1.0–2.5)
ALT: 21 U/L (ref 9–46)
AST: 20 U/L (ref 10–35)
Albumin: 4.1 g/dL (ref 3.6–5.1)
Alkaline phosphatase (APISO): 87 U/L (ref 35–144)
BUN/Creatinine Ratio: 8 (calc) (ref 6–22)
BUN: 12 mg/dL (ref 7–25)
CO2: 28 mmol/L (ref 20–32)
Calcium: 9.2 mg/dL (ref 8.6–10.3)
Chloride: 103 mmol/L (ref 98–110)
Creat: 1.43 mg/dL — ABNORMAL HIGH (ref 0.70–1.33)
GFR, Est African American: 63 mL/min/{1.73_m2} (ref >=60)
GFR, Est Non African American: 55 mL/min/{1.73_m2} — ABNORMAL LOW (ref >=60)
Globulin: 2.7 g/dL (calc) (ref 1.9–3.7)
Glucose, Bld: 227 mg/dL — ABNORMAL HIGH (ref 65–99)
Potassium: 4.7 mmol/L (ref 3.5–5.3)
Sodium: 138 mmol/L (ref 135–146)
Total Bilirubin: 0.4 mg/dL (ref 0.2–1.2)
Total Protein: 6.8 g/dL (ref 6.1–8.1)

## 2019-03-23 LAB — CBC WITH DIFFERENTIAL/PLATELET
Absolute Monocytes: 644 cells/uL (ref 200–950)
Basophils Absolute: 124 cells/uL (ref 0–200)
Basophils Relative: 1.1 %
Eosinophils Absolute: 723 cells/uL — ABNORMAL HIGH (ref 15–500)
Eosinophils Relative: 6.4 %
HCT: 41.4 % (ref 38.5–50.0)
Hemoglobin: 14 g/dL (ref 13.2–17.1)
Lymphs Abs: 4068 cells/uL — ABNORMAL HIGH (ref 850–3900)
MCH: 31.5 pg (ref 27.0–33.0)
MCHC: 33.8 g/dL (ref 32.0–36.0)
MCV: 93 fL (ref 80.0–100.0)
MPV: 11 fL (ref 7.5–12.5)
Monocytes Relative: 5.7 %
Neutro Abs: 5740 cells/uL (ref 1500–7800)
Neutrophils Relative %: 50.8 %
Platelets: 231 10*3/uL (ref 140–400)
RBC: 4.45 10*6/uL (ref 4.20–5.80)
RDW: 12.4 % (ref 11.0–15.0)
Total Lymphocyte: 36 %
WBC: 11.3 10*3/uL — ABNORMAL HIGH (ref 3.8–10.8)

## 2019-03-23 LAB — QUANTIFERON-TB GOLD PLUS
Mitogen-NIL: >10 IU/mL
NIL: 0.15 IU/mL
QuantiFERON-TB Gold Plus: NEGATIVE
TB1-NIL: <0 IU/mL
TB2-NIL: 0.08 IU/mL

## 2019-03-23 NOTE — Progress Notes (Signed)
TB gold negative

## 2019-04-05 ENCOUNTER — Ambulatory Visit: Payer: 59 | Admitting: Rheumatology

## 2019-04-05 ENCOUNTER — Other Ambulatory Visit: Payer: Self-pay

## 2019-04-05 ENCOUNTER — Encounter: Payer: Self-pay | Admitting: Rheumatology

## 2019-04-05 VITALS — BP 124/72 | HR 68 | Resp 14 | Ht 68.0 in | Wt 188.0 lb

## 2019-04-05 DIAGNOSIS — Z87448 Personal history of other diseases of urinary system: Secondary | ICD-10-CM

## 2019-04-05 DIAGNOSIS — M0630 Rheumatoid nodule, unspecified site: Secondary | ICD-10-CM | POA: Diagnosis not present

## 2019-04-05 DIAGNOSIS — M170 Bilateral primary osteoarthritis of knee: Secondary | ICD-10-CM

## 2019-04-05 DIAGNOSIS — Z79899 Other long term (current) drug therapy: Secondary | ICD-10-CM

## 2019-04-05 DIAGNOSIS — M0579 Rheumatoid arthritis with rheumatoid factor of multiple sites without organ or systems involvement: Secondary | ICD-10-CM

## 2019-04-05 DIAGNOSIS — Z720 Tobacco use: Secondary | ICD-10-CM

## 2019-04-05 DIAGNOSIS — E785 Hyperlipidemia, unspecified: Secondary | ICD-10-CM

## 2019-04-05 DIAGNOSIS — Z8639 Personal history of other endocrine, nutritional and metabolic disease: Secondary | ICD-10-CM

## 2019-04-05 DIAGNOSIS — M7022 Olecranon bursitis, left elbow: Secondary | ICD-10-CM

## 2019-04-05 DIAGNOSIS — Z8679 Personal history of other diseases of the circulatory system: Secondary | ICD-10-CM

## 2019-04-05 NOTE — Progress Notes (Signed)
Office Visit Note  Patient: [name]             Date of Birth: August 19, 1965           MRN: [id number]             PCP: [name], MD Referring: [name], MD Visit Date: 04/05/2019 Occupation: @GUAROCC @  Subjective:  Left elbow joint pain   History of Present Illness: [name] is a 53 y.o. male with history of seropositive rheumatoid arthritis and osteoarthritis. He is on Enbrel 50 mg sq weekly injections.  He was started on Arava 10 mg po daily after his visit on 03/21/19.  He has been experiencing diarrhea on a daily basis and has not increased the dose to 20 mg daily yet. He has tried taking 03/23/19 with food but it has not helped.  He presents today with left olecranon bursitis.  He would like it aspirated today.  He denies any recent injuries.  He denies any other joint pain or joint swelling at this time.  He denies any increased morning stiffness.  Activities of Daily Living:  Patient reports morning stiffness for 10  minutes.   Patient Denies nocturnal pain.  Difficulty dressing/grooming: Denies Difficulty climbing stairs: Denies Difficulty getting out of chair: Denies Difficulty using hands for taps, buttons, cutlery, and/or writing: Denies  Review of Systems  Constitutional: Positive for fatigue. Negative for night sweats.  HENT: Positive for mouth dryness. Negative for mouth sores, trouble swallowing, trouble swallowing and nose dryness.   Eyes: Negative for redness and dryness.  Respiratory: Negative for cough, hemoptysis, shortness of breath and difficulty breathing.   Cardiovascular: Negative for chest pain, palpitations, hypertension, irregular heartbeat and swelling in legs/feet.  Gastrointestinal: Positive for diarrhea (SE of Arava). Negative for blood in stool and constipation.  Endocrine: Negative for increased urination.  Genitourinary: Negative for painful urination.  Musculoskeletal: Positive for arthralgias, joint pain, joint swelling  and morning stiffness. Negative for myalgias, muscle weakness, muscle tenderness and myalgias.  Skin: Negative for color change, rash, hair loss, nodules/bumps, skin tightness, ulcers and sensitivity to sunlight.  Allergic/Immunologic: Negative for susceptible to infections.  Neurological: Negative for dizziness, fainting, memory loss, night sweats and weakness.  Hematological: Negative for swollen glands.  Psychiatric/Behavioral: Negative for depressed mood and sleep disturbance. The patient is not nervous/anxious.     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   Chest pain with high risk for cardiac etiology 10/29/2015   Asymptomatic bilateral carotid artery stenosis 06/08/2014   Pleural effusion, bilateral 06/03/2014   Dyspnea 06/01/2014   Diabetes (HCC) 05/03/2014   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/03/2014   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   Chronic renal disease, stage 3, moderately decreased glomerular filtration rate between 30-59 mL/min/1.73 square meter (HCC) 05/03/2014   CAD (coronary artery disease) 04/27/2014    Past Medical History:  Diagnosis Date   Anginal pain (HCC)    Arthritis    RA IN HANDS  Asthma    CAD (coronary artery disease) 04/27/2014   Chronic renal disease, stage 3, moderately decreased glomerular filtration rate between 30-59 mL/min/1.73 square meter ([place]) 05/03/2014   Coronary artery  disease    Diabetes ([place]) 05/03/2014   Diabetes mellitus without complication (HCC)    type 1   Hyperlipidemia    Left shoulder pain    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/03/2014   Shortness of breath    Sleep apnea    mild OSA, no CPAP   Unstable angina pectoris ([place]) 04/25/2014    Family History  Problem Relation Age of Onset   Prostate cancer Father    Heart disease Father    Stomach cancer Mother    Heart disease Brother    Asthma Daughter    Healthy Daughter    Past Surgical History:  Procedure Laterality Date   CARDIAC CATHETERIZATION  04/25/2014   BY DR [place] N/A 10/30/2015   Procedure: Left Heart Cath and Cors/Grafts Angiography;  Surgeon: Adrian Prows, MD;  Location: [facility];  Service: Cardiovascular;  Laterality: N/A;   CARPAL TUNNEL RELEASE     CORONARY ARTERY BYPASS GRAFT N/A 04/27/2014   Procedure: CORONARY ARTERY BYPASS GRAFTING on pump using left internal mammary artery to LAD coronary artery, right great saphenous vein graft to diagonal coronary artery with sequential to OM1 and circumflex coronary arteries. Right greater saphenous vein graft to posterior descending coronary artery. ;  Surgeon: Grace Isaac, MD;  Location: [facility];  Service: Open Heart Surgery;  Laterality: N   ENDOVEIN HARVEST OF GREATER SAPHENOUS VEIN Right 04/27/2014   Procedure: ENDOVEIN HARVEST OF GREATER SAPHENOUS VEIN;  Surgeon: Grace Isaac, MD;  Location: [facility];  Service: Open Heart Surgery;  Laterality: Right;   EXCISION ORAL TUMOR N/A 03/01/2018   Procedure: EXCISION ORAL TUMOR;  Surgeon: Melida Quitter, MD;  Location: [facility];  Service: ENT;  Laterality: N/A;   EYE SURGERY     LASER   INTRAOPERATIVE TRANSESOPHAGEAL ECHOCARDIOGRAM N/A 04/27/2014   Procedure: INTRAOPERATIVE TRANSESOPHAGEAL ECHOCARDIOGRAM;  Surgeon: Grace Isaac, MD;  Location: [facility];  Service: Open Heart Surgery;   Laterality: N/A;   LEFT HEART CATHETERIZATION WITH CORONARY ANGIOGRAM N/A 04/25/2014   Procedure: LEFT HEART CATHETERIZATION WITH CORONARY ANGIOGRAM;  Surgeon: Laverda Page, MD;  Location[facility];  Service: Cardiovascular;  Laterality: N/A;   MOUTH SURGERY  11/15/2017   tongue surgery    Social History   Social History Narrative   Not on file    There is no immunization history on file for this patient.   Objective: Vital Signs: BP 124/72 (BP Location: Right Arm, Patient Position: Sitting, Cuff Size: Normal)    Pulse 68    Resp 14    Ht 5\' 8"  (1.727 m)    Wt 188 lb (85.3 kg)    BMI 28.59 kg/m    Physical Exam Vitals signs and nursing note reviewed.  Constitutional:      Appearance: He is well-developed.  HENT:     Head: Normocephalic and atraumatic.  Eyes:     Conjunctiva/sclera: Conjunctivae normal.     Pupils: Pupils are equal, round, and reactive to light.  Neck:     Musculoskeletal: Normal range of motion and neck supple.  Cardiovascular:     Rate and Rhythm: Normal rate and regular rhythm.     Heart sounds: Normal heart sounds.  Pulmonary:     Effort:  Pulmonary effort is normal.     Breath sounds: Normal breath sounds.  Abdominal:     General: Bowel sounds are normal.     Palpations: Abdomen is soft.  Skin:    General: Skin is warm and dry.     Capillary Refill: Capillary refill takes less than 2 seconds.  Neurological:     Mental Status: He is alert and oriented to person, place, and time.  Psychiatric:        Behavior: Behavior normal.      Musculoskeletal Exam: C-spine, thoracic spine, lumbar spine good range of motion.  No midline spinal tenderness.  No SI joint tenderness.  Shoulder joints, elbows, wrist joints, MCPs, PIPs and DIPs good range of motion no synovitis.  Left olecranon bursitis.  Complete fist formation bilaterally.  PIP and DIP synovial thickening consistent with osteoarthritis of bilateral hands.  Hip joints, knee joints, ankle  joints, MTPs, PIPs and DIPs good range of motion no synovitis.  No warmth or effusion of bilateral knee joints.  No tenderness or swelling of ankle joints.  CDAI Exam: CDAI Score: 1  Patient Global: 5 mm; Provider Global: 5 mm Swollen: 0 ; Tender: 0  Joint Exam   No joint exam has been documented for this visit   There is currently no information documented on the homunculus. Go to the Rheumatology activity and complete the homunculus joint exam.  Investigation: No additional findings.  Imaging: No results found.  Recent Labs: Lab Results  Component Value Date   WBC 11.3 (H) 03/21/2019   HGB 14.0 03/21/2019   PLT 231 03/21/2019   NA 138 03/21/2019   K 4.7 03/21/2019   CL 103 03/21/2019   CO2 28 03/21/2019   GLUCOSE 227 (H) 03/21/2019   BUN 12 03/21/2019   CREATININE 1.43 (H) 03/21/2019   BILITOT 0.4 03/21/2019   ALKPHOS 97 03/15/2018   AST 20 03/21/2019   ALT 21 03/21/2019   PROT 6.8 03/21/2019   ALBUMIN 3.7 03/15/2018   CALCIUM 9.2 03/21/2019   GFRAA 63 03/21/2019   QFTBGOLDPLUS NEGATIVE 03/21/2019    Speciality Comments: No specialty comments available.  Procedures:  Medium Joint Inj: L olecranon bursa on 04/05/2019 9:49 AM Indications: pain Details: 27 G 1.5 in needle, posterior approach Medications: 2 mL lidocaine 1 %; 30 mg triamcinolone acetonide 40 MG/ML Aspirate: 13 mL clear; sent for lab analysis Outcome: tolerated well, no immediate complications Procedure, treatment alternatives, risks and benefits explained, specific risks discussed. Consent was given by the patient. Immediately prior to procedure a time out was called to verify the correct patient, procedure, equipment, support staff and site/side marked as required. Patient was prepped and draped in the usual sterile fashion.     Allergies: Morphine and related   Assessment / Plan:     Visit Diagnoses: Rheumatoid arthritis involving multiple sites with positive rheumatoid factor [facility](HCC): He presents  today with olecranon bursitis of the left elbow.  An aspiration and cortisone injection was performed today.  He has no other joint pain or joint swelling at this time.  He has not experienced any increased morning stiffness.  He is on Enbrel 50 mg subcutaneous injections once weekly and was started on Arava 10 mg 1 tablet by mouth daily on 03/21/2019.  He has been experiencing diarrhea after every dose of Arava.  He was advised to discontinue Arava at this time.  He will continue on Enbrel monotherapy and follow-up in 1 month.  If he continues to have recurrent  flares we will discuss switching him to Orencia 125 mg subcu weekly injections.  He is advised to notify us if he develops increased joint pains or joint swelling.  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  He has taken Arava 10 mg 1 tablet by mouth daily for 2 weeks.  He has been experiencing diarrhea after each dose.  He was advised to discontinue Arava at this time.  He will continue on Enbrel monotherapy.  In the future if we have to switch from Enbrel Orencia may be a treatment option.  He has history of coronary artery disease and dyslipidemia, so we will try to avoid interleukin-6 inhibitors and Jak inhibitors. TB gold negative on 03/21/2019.  CBC and CMP were drawn on 03/21/2019.  He will return for lab work in November and every 3 months to monitor for drug toxicity.   Olecranon bursitis of left elbow -He presents today with left olecranon bursitis.  He has not had any recent injuries.  He has warmth, tenderness and effusion.  An aspiration of 13 mL was drawn off.  Fluid was sent for analysis.  Cortisone injection was performed today.  He was advised to monitor his blood glucose closely follow-up.  Plan: Synovial cell count + diff, w/ crystals, Gram stain, Anaerobic and Aerobic Culture  Primary osteoarthritis of both knees: He has good  range of motion with no discomfort.  No warmth or effusion noted.  He has no difficulty with ADLs.  Other medical conditions are listed as follows  History of coronary artery disease  History of chronic kidney disease  History of hypertension  History of diabetes mellitus  Dyslipidemia  Tobacco abuse  Orders: Orders Placed This Encounter  Procedures   Medium Joint Inj   Gram stain   Anaerobic and Aerobic Culture   Synovial cell count + diff, w/ crystals   No orders of the defined types were placed in this encounter.   Face-to-face time spent with patient was 30 minutes. Greater than 50% of time was spent in counseling and coordination of care.  Follow-Up Instructions: Return in about 4 weeks (around 05/03/2019) for Rheumatoid arthritis, Osteoarthritis.   Gearldine Bienenstock, PA-C   I examined and evaluated the patient with Sherron Ales PA.  Patient has developed left olecranon bursitis.  Which is quite painful.  Per his request left olecranon bursa was aspirated.  The synovial fluid was sent for analysis.  It was also injected with cortisone as described above.  He is is having intolerance to [place].  We will try monotherapy with Enbrel.  If he has an adequate response we may have to consider other medications.  The plan of care was discussed as noted above.  Pollyann Savoy, MD  Note - This record has been created using Animal nutritionist.  Chart creation errors have been sought, but may not always  have been located. Such creation errors do not reflect on  the standard of medical care.

## 2019-04-05 NOTE — Progress Notes (Signed)
Pharmacy Note  Subjective:  Patient presents today to the [facility] to see Dr. Estanislado Pandy.   Patient seen by the pharmacist for counseling on dry mouth.  He is concerned that one of his medications could be contributing to dry mouth.  Objective: Current Outpatient Medications on File Prior to Visit  Medication Sig Dispense Refill  . Apoaequorin (PREVAGEN PO) Take by mouth.    . Ascorbic Acid (VITAMIN C) 1000 MG tablet Take 1,000 mg by mouth daily.    Marland Kitchen aspirin 81 MG tablet Take 81 mg by mouth daily.    Marland Kitchen buPROPion (WELLBUTRIN SR) 150 MG 12 hr tablet Take 1 tablet (150 mg total) by mouth daily. 90 tablet 1  . Cholecalciferol (VITAMIN D3) 1.25 MG (50000 UT) CAPS Take by mouth.    . COD LIVER OIL PO Take 650 mg by mouth daily.     . Cyanocobalamin (VITAMIN B-12 PO) Take by mouth daily.    Marland Kitchen etanercept (ENBREL SURECLICK) 50 MG/ML injection INJECT 50MG  SUBCUTANEOUSLY  EVERY WEEK 12 mL 0  . Insulin Glargine (BASAGLAR KWIKPEN [place]) Inject into the skin. 19 units am 19 units pm    . insulin lispro (HUMALOG) 100 UNIT/ML injection Inject 4-17 Units into the skin See admin instructions. Inject 3-4 times daily with meals per sliding scale instructions on glucose meter: 1 unit for every 10 grams of carbs and CBG calculations    . leflunomide (ARAVA) 10 MG tablet Take 1 tablet po daily for 2 weeks. Then increase to 2 tablets po daily. 42 tablet 0  . magnesium gluconate (MAGONATE) 500 MG tablet Take 500 mg by mouth 2 (two) times daily.    . nebivolol (BYSTOLIC) 5 MG tablet Take 1 tablet (5 mg total) by mouth daily. 30 tablet 3  . nitroGLYCERIN (NITROSTAT) 0.4 MG SL tablet Place 1 tablet (0.4 mg total) under the tongue every 5 (five) minutes as needed for chest pain. 25 tablet 3  . Probiotic Product (PROBIOTIC PO) Take 1 tablet by mouth daily.    Marland Kitchen REPATHA SURECLICK 852 MG/ML SOAJ Inject 140 mg as directed every 14 (fourteen) days. 140 mL 3  . tadalafil (CIALIS) 20 MG tablet Take 20 mg by mouth  daily as needed for erectile dysfunction.     No current facility-administered medications on file prior to visit.      Assessment/Plan:  Reviewed current medication regimen. He is on Wellbutrin which was prescribed post heart surgery for smoking cessation and mood.  Wellbutrin can contribute to dry mouth.  The Wellbutrin was not effective in helping him quit smoking and lately has not been as effective in helping with mood as it had in the past.  His stress has increased at work and in his personal life.  Recommend patient discuss alternative treatment options for smoking cessation and mood. Patient verbalized understanding.  Counseled patient on dry mouth management strategies such as small sips of water, chewing hard candies, and over the counter products.  Patient verbalized understanding.  All questions encouraged and answered.  Instructed patient to call with any other questions or concerns.   Mariella Saa, PharmD, [place], [place] Clinical Specialty Pharmacist [phone]  04/05/2019 10:04 AM    All questions

## 2019-04-05 NOTE — Patient Instructions (Signed)
Recommend talking to PCP about Wellbutrin causing dry mouth and switching to alternative medication for mood as it has not been effective.  Treating Dry Mouth  Dry Mouth (also called xerostomia) is a condition that occurs when your body doesn't produce enough saliva. Saliva is an essential body fluid for protection and preservation of the oral cavity and oral functions.  Dry mouth can cause complications such as difficulty swallowing, severe and progressive tooth decay, and oral infections (particularly fungal).   Below are products and other management strategies that can help relieve the symptoms and reduce complications of dry mouth.  . Keep your mouth moist by sipping small amounts of water during the day. Excessive sips of can reduce the oral mucus film and increase symptoms. . Avoid frequent intake of acidic and caffeinated beverages. . Increase salivary secretion by chewing gum containing no sugar or sucking sugar free hard candies. . Over-the-counter saliva substitutes such as Biotne( Moisturizing Gel, Oral Rinses and Mouth Spray), Oramoist (patches), OraCoat (melts and lozenges).  Biotne also makes non-irritating toothpaste.

## 2019-04-08 ENCOUNTER — Encounter: Payer: Self-pay | Admitting: Rheumatology

## 2019-04-08 ENCOUNTER — Telehealth: Payer: Self-pay | Admitting: *Deleted

## 2019-04-08 MED ORDER — PREDNISONE 5 MG PO TABS: ORAL_TABLET | ORAL | 0 refills | Status: DC

## 2019-04-08 NOTE — Telephone Encounter (Signed)
Patient states he was in the office on 04/05/19. Patient states he had fluid removed from his left elbow and was given cortisone. Patient states it is swollen again and larger then it was on 04/05/19. Patient states he is going to send a picture through my chart. Patient would like to know what you would like for him to do.

## 2019-04-08 NOTE — Telephone Encounter (Signed)
Patient advised prescription for Prednisone sent to the pharmacy.   Please advise the patient to monitor blood glucose closely while taking prednisone.   Synovial analysis is consistent with an inflammatory process (RA flare).  Culture has not revealed any growth.    We will discuss other treatment options will discussed at his follow up visit on 05/03/19.

## 2019-04-08 NOTE — Telephone Encounter (Signed)
I spoke with Dr. Estanislado Pandy and she is ok with sending in a prednisone taper starting at 10 mg tapering by 2.5 mg every 4 days.  Please advise the patient to monitor blood glucose closely while taking prednisone.   Synovial analysis is consistent with an inflammatory process (RA flare).  Culture has not revealed any growth.    We will discuss other treatment options will discussed at his follow up visit on 929/20.

## 2019-04-11 LAB — SYNOVIAL CELL COUNT + DIFF, W/ CRYSTALS
Basophils, %: 0 %
Eosinophils-Synovial: 1 % (ref 0–2)
Lymphocytes-Synovial Fld: 20 % (ref 0–74)
Monocyte/Macrophage: 10 % (ref 0–69)
Neutrophil, Synovial: 69 % — ABNORMAL HIGH (ref 0–24)
Synoviocytes, %: 0 % (ref 0–15)
WBC, Synovial: 4110 cells/uL — ABNORMAL HIGH (ref <150)

## 2019-04-11 LAB — ANAEROBIC AND AEROBIC CULTURE
AER RESULT:: NO GROWTH
MICRO NUMBER:: 838856
MICRO NUMBER:: 838867
SPECIMEN QUALITY:: ADEQUATE
SPECIMEN QUALITY:: ADEQUATE

## 2019-04-12 ENCOUNTER — Telehealth: Payer: Self-pay | Admitting: Rheumatology

## 2019-04-12 NOTE — Progress Notes (Signed)
Culture did not reveal any growth.  No crystals present.  Analysis consistent with inflammatory process-RA flare.

## 2019-04-12 NOTE — Telephone Encounter (Signed)
Patient called stating his left elbow is still swollen and it feels like it is filling back up with fluid.  Patient states he was not able to take Prednisone due to affecting his mood.  Patient states he has been taking Extra Strength Tylenol and applying ice.  Patient is requesting a return call.

## 2019-04-12 NOTE — Progress Notes (Signed)
Office Visit Note  Patient: [name]             Date of Birth: 01-24-1966           MRN: [id number]             PCP: Jarome MatinPaterson, Daniel, MD Referring: Jarome MatinPaterson, Daniel, MD Visit Date: 04/13/2019 Occupation: @GUAROCC @  Subjective:  Left elbow pain and swelling     History of Present Illness: [name] is a 53 y.o. male with history of seropositive rheumatoid arthritis and osteoarthritis.  Patient is on Enbrel 50 mg subcutaneous injections once weekly.  His last injection November was on Sunday.  He was started on Arava in August 2020 but could not tolerated due to diarrhea and discontinued.  He was started on a prednisone taper on 04/08/2019 but only took 1 5 mg tablet due to experiencing mood swings and irritability.  He has been taking Tylenol as needed for pain relief and has been applying ice.  He presents today with olecranon bursitis of the left elbow.  He had an aspiration and cortisone injection performed on 04/05/2019.  He reports that the pain and fluid return 1 day later.  He is requesting for it to be drained today.  He denies any other joint pain or joint swelling at this time.    Activities of Daily Living:  Patient reports morning stiffness for 15 minute.   Patient Reports nocturnal pain.  Difficulty dressing/grooming: Denies Difficulty climbing stairs: Denies Difficulty getting out of chair: Denies Difficulty using hands for taps, buttons, cutlery, and/or writing: Denies  Review of Systems  Constitutional: Negative for fatigue.  HENT: Negative for mouth dryness.   Eyes: Negative for dryness.  Respiratory: Negative for shortness of breath and difficulty breathing.   Cardiovascular: Negative for chest pain and swelling in legs/feet.  Gastrointestinal: Negative for constipation and diarrhea.  Endocrine: Negative for excessive thirst.  Genitourinary: Negative for difficulty urinating.  Musculoskeletal: Positive for joint swelling.  Skin: Negative for rash  and redness.  Allergic/Immunologic: Negative for susceptible to infections.  Neurological: Negative for light-headedness and headaches.  Hematological: Negative for bruising/bleeding tendency.  Psychiatric/Behavioral: Negative for sleep disturbance.    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   Chest pain with high risk for cardiac etiology 10/29/2015   Asymptomatic bilateral carotid artery stenosis 06/08/2014   Pleural effusion, bilateral 06/03/2014   Dyspnea 06/01/2014   Diabetes (HCC) 05/03/2014   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/03/2014   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   Chronic renal disease, stage 3, moderately decreased glomerular filtration rate between 30-59 mL/min/1.73 square meter (HCC) 05/03/2014   CAD (coronary artery disease) 04/27/2014    Past Medical History:  Diagnosis Date   Anginal pain (HCC)    Arthritis    RA IN HANDS   Asthma    CAD (coronary artery disease) 04/27/2014   Chronic renal disease, stage 3, moderately decreased glomerular filtration rate between 30-59 mL/min/1.73 square meter (HCC) 05/03/2014  Coronary artery disease    Diabetes (HCC) 05/03/2014   Diabetes mellitus without complication (HCC)    type 1   Hyperlipidemia    Left shoulder pain    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/03/2014   Shortness of breath    Sleep apnea    mild OSA, no CPAP    Unstable angina pectoris (HCC) 04/25/2014    Family History  Problem Relation Age of Onset   Prostate cancer Father    Heart disease Father    Stomach cancer Mother    Heart disease Brother    Asthma Daughter    Healthy Daughter    Past Surgical History:  Procedure Laterality Date   CARDIAC CATHETERIZATION  04/25/2014   BY DR Jacinto Halim   CARDIAC CATHETERIZATION N/A 10/30/2015   Procedure: Left Heart Cath and Cors/Grafts Angiography;  Surgeon: Yates Decamp, MD;  Location: [facility] INVASIVE CV LAB;  Service: Cardiovascular;  Laterality: N/A;   CARPAL TUNNEL RELEASE     CORONARY ARTERY BYPASS GRAFT N/A 04/27/2014   Procedure: CORONARY ARTERY BYPASS GRAFTING on pump using left internal mammary artery to LAD coronary artery, right great saphenous vein graft to diagonal coronary artery with sequential to OM1 and circumflex coronary arteries. Right greater saphenous vein graft to posterior descending coronary artery. ;  Surgeon: Delight Ovens, MD;  Location: [facility] OR;  Service: Open Heart Surgery;  Laterality: N   ENDOVEIN HARVEST OF GREATER SAPHENOUS VEIN Right 04/27/2014   Procedure: ENDOVEIN HARVEST OF GREATER SAPHENOUS VEIN;  Surgeon: Delight Ovens, MD;  Location: [facility] OR;  Service: Open Heart Surgery;  Laterality: Right;   EXCISION ORAL TUMOR N/A 03/01/2018   Procedure: EXCISION ORAL TUMOR;  Surgeon: Christia Reading, MD;  Location:  SURGERY CENTER;  Service: ENT;  Laterality: N/A;   EYE SURGERY     LASER   INTRAOPERATIVE TRANSESOPHAGEAL ECHOCARDIOGRAM N/A 04/27/2014   Procedure: INTRAOPERATIVE TRANSESOPHAGEAL ECHOCARDIOGRAM;  Surgeon: Delight Ovens, MD;  Location: [facility] OR;  Service: Open Heart Surgery;  Laterality: N/A;   LEFT HEART CATHETERIZATION WITH CORONARY ANGIOGRAM N/A 04/25/2014   Procedure: LEFT HEART CATHETERIZATION WITH CORONARY ANGIOGRAM;  Surgeon: Pamella Pert, MD;  Location: [facility] CATH LAB;  Service: Cardiovascular;  Laterality: N/A;   MOUTH SURGERY  11/15/2017    tongue surgery    Social History   Social History Narrative   Not on file    There is no immunization history on file for this patient.   Objective: Vital Signs: BP 127/65 (BP Location: Right Arm, Patient Position: Sitting, Cuff Size: Normal)    Pulse 69    Ht 5\' 8"  (1.727 m)    Wt 185 lb (83.9 kg)    BMI 28.13 kg/m    Physical Exam Vitals signs and nursing note reviewed.  Constitutional:      Appearance: He is well-developed.  HENT:     Head: Normocephalic and atraumatic.  Eyes:     Conjunctiva/sclera: Conjunctivae normal.     Pupils: Pupils are equal, round, and reactive to light.  Neck:     Musculoskeletal: Normal range of motion and neck supple.  Pulmonary:     Effort: Pulmonary effort is normal.  Abdominal:     General: Bowel sounds are normal.     Palpations: Abdomen is soft.  Skin:    General: Skin is warm and dry.     Capillary Refill: Capillary refill takes less than 2 seconds.  Neurological:     Mental Status: He is alert  and oriented to person, place, and time.  Psychiatric:        Behavior: Behavior normal.      Musculoskeletal Exam: C-spine limited range of motion.  Thoracic and lumbar spine good range of motion.  Shoulder joints, wrist joints, MCPs, PIPs, DIPs good range of motion with no synovitis.  Olecranon bursitis of the left elbow joint noted.  Hip joints, knee joints, ankle joints, MTPs, PIPs, DIPs good range of motion no synovitis.  No warmth or effusion of bilateral knee joints.  No tenderness or swelling of ankle joints.  CDAI Exam: CDAI Score: 3  Patient Global: 5 mm; Provider Global: 5 mm Swollen: 1 ; Tender: 1  Joint Exam      Right  Left  Elbow     Swollen Tender     Investigation: No additional findings.  Imaging: No results found.  Recent Labs: Lab Results  Component Value Date   WBC 11.3 (H) 03/21/2019   HGB 14.0 03/21/2019   PLT 231 03/21/2019   NA 138 03/21/2019   K 4.7 03/21/2019   CL 103 03/21/2019   CO2 28  03/21/2019   GLUCOSE 227 (H) 03/21/2019   BUN 12 03/21/2019   CREATININE 1.43 (H) 03/21/2019   BILITOT 0.4 03/21/2019   ALKPHOS 97 03/15/2018   AST 20 03/21/2019   ALT 21 03/21/2019   PROT 6.8 03/21/2019   ALBUMIN 3.7 03/15/2018   CALCIUM 9.2 03/21/2019   GFRAA 63 03/21/2019   QFTBGOLDPLUS NEGATIVE 03/21/2019    Speciality Comments: -last dose 04/10/19) Inadequate response to Humira in the past.  D/c MTX due to elevated creatinine and GI SE,  discontinued Arava after 2 weeks due to SE of diarrhea.  PLQ is not a good option due to diabetic retinopathy and CKD stage 3.    Procedures:  Medium Joint Inj: L olecranon bursa on 04/13/2019 10:42 AM Indications: pain Details: 27 G 1.5 in needle, posterior approach Medications: 2 mL lidocaine 1 % Aspirate: 8 mL clear Outcome: tolerated well, no immediate complications Procedure, treatment alternatives, risks and benefits explained, specific risks discussed. Consent was given by the patient. Immediately prior to procedure a time out was called to verify the correct patient, procedure, equipment, support staff and site/side marked as required. Patient was prepped and draped in the usual sterile fashion.     Allergies: Morphine and related    Assessment / Plan:     Visit Diagnoses: Rheumatoid arthritis involving multiple sites with positive rheumatoid factor [facility]): He presents today with olecranon bursitis of the left elbow joint.  He had an aspiration and cortisone injection performed on 04/05/2019.  Analysis was consistent with an inflammatory process no crystals were noted and cultures were negative.  The tenderness and inflammation returned 1 day later.  He was started on a prednisone taper but can only tolerate taking 5 mg 1 tablet for 1 day due to experiencing mood swings and irritability.  He returns today for an aspiration and 8 ml of fluid was drawn off.  His pain subsided substantially after the procedure was performed.  He has no other joint  pain or joint swelling at this time.  He has been injecting Enbrel 50 mg subcutaneously every week since March 2011.  His last dose was on 04/10/2019.  He tried taking Arava 10 mg by mouth daily for 2 weeks but discontinued due to experiencing diarrhea.  In the past he took methotrexate but had to discontinue due to elevated creatinine and GI side effects.  He had an inadequate response to Humira in the past.  We discussed switching him from Amaryl to Orencia 125 mg subcutaneous injections once weekly.  Indications, contraindications, potential side effects were discussed.  All questions were addressed and consent was obtained today.  Once improved by his insurance he will return for nurse visit for the administration of the first injection.  He will follow-up in the office in 1 month to assess his response and obtain lab work.  Medication counseling:  Does patient have a diagnosis of COPD? No  Counseled patient that Dub AmisOrencia is a selective T-cell costimulation blocker indicated for rheumatoid arthritis.  Counseled patient on purpose, proper use, and adverse effects of Orencia. The most common adverse effects are increased risk of infections, headache, and infusion reactions.  There is the possibility of an increased risk of malignancy but it is not well understood if this increased risk is due to the medication or the disease state.  Reviewed the importance of regular labs while on Orencia therapy.  Counseled patient that Dub AmisOrencia should be held prior to scheduled surgery.  Counseled patient to avoid live vaccines while on Orencia.  Advised patient to get annual influenza vaccine and the pneumococcal vaccine as indicated.  Provided patient with medication education material and answered all questions.  [facility]  Will upload consent into patient's chart.  Will submit benefit's investigation for Orencia.  Rheumatoid nodulosis (HCC):  No new nodules.   High risk medication use -We will apply  for Orencia 125 mg sq weekly injections. Inadequate response to Enbrel 50 mg sq weekly injections (started in March 2011-last dose 04/10/19) Inadequate response to Humira in the past.  D/c MTX due to elevated creatinine and GI SE,  discontinued Arava after 2 weeks due to SE of diarrhea.  PLQ is not a good option due to diabetic retinopathy and CKD stage 3.    Olecranon bursitis of left elbow - He presents today with olecranon bursitis of the left elbow joint.  He has tenderness and erythema on exam.  On 04/05/2019 he had 13 ml of fluid aspirated and had a cortisone injection was performed.  Analysis was consistent with an inflammatory process.  Culture was negative and no crystals were present.  The fluid and pain returned 1 day later.  He tried taking prednisone 5 mg 1 tablet by mouth for 1 day but could not tolerate the mood swings and irritability.  He is requesting for the fluid to be drawn off today. 8 ml of fluid was drawn off.    Primary osteoarthritis of both knees: He has good ROM with no discomfort.  No warmth or effusion of knee joints noted.    Other medical conditions are listed as follows:   History of chronic kidney disease  History of diabetes mellitus  History of coronary artery disease  History of hypertension  Dyslipidemia  Tobacco abuse  Orders: Orders Placed This Encounter  Procedures   Medium Joint Inj   No orders of the defined types were placed in this encounter.   Face-to-face time spent with patient was 30 minutes. Greater than 50% of time was spent in counseling and coordination of care.  Follow-Up Instructions: Return in about 4 weeks (around 05/11/2019) for Rheumatoid arthritis, Osteoarthritis.   Gearldine Bienenstockaylor M Jodeen Mclin, PA-C   I examined and evaluated the patient with Sherron Alesaylor Zarius Furr PA.  He had detailed discussion with the patient regarding different treatment options as he continues to have flare.  Per his request  his left olecranon bursa was aspirated but not  injected with cortisone as he had recent injection.  He could not tolerate oral prednisone due to side effects.  We discussed the option of switching him from Enbrel to [place].  He was in agreement.  We will apply for Orencia and start him on Orencia soon as possible.  The plan of care was discussed as noted above.  Bo Merino, MD  Note - This record has been created using Editor, commissioning.  Chart creation errors have been sought, but may not always  have been located. Such creation errors do not reflect on  the standard of medical care.

## 2019-04-12 NOTE — Telephone Encounter (Signed)
Spoke with patient and scheduled a follow up visit on 04/13/19 at 9:40 am.

## 2019-04-13 ENCOUNTER — Other Ambulatory Visit: Payer: Self-pay

## 2019-04-13 ENCOUNTER — Encounter: Payer: Self-pay | Admitting: Physician Assistant

## 2019-04-13 ENCOUNTER — Ambulatory Visit: Payer: 59 | Admitting: Rheumatology

## 2019-04-13 ENCOUNTER — Telehealth: Payer: Self-pay | Admitting: Pharmacist

## 2019-04-13 VITALS — BP 127/65 | HR 69 | Ht 68.0 in | Wt 185.0 lb

## 2019-04-13 DIAGNOSIS — Z79899 Other long term (current) drug therapy: Secondary | ICD-10-CM

## 2019-04-13 DIAGNOSIS — M0630 Rheumatoid nodule, unspecified site: Secondary | ICD-10-CM | POA: Diagnosis not present

## 2019-04-13 DIAGNOSIS — E785 Hyperlipidemia, unspecified: Secondary | ICD-10-CM

## 2019-04-13 DIAGNOSIS — Z720 Tobacco use: Secondary | ICD-10-CM

## 2019-04-13 DIAGNOSIS — Z8639 Personal history of other endocrine, nutritional and metabolic disease: Secondary | ICD-10-CM

## 2019-04-13 DIAGNOSIS — M7022 Olecranon bursitis, left elbow: Secondary | ICD-10-CM

## 2019-04-13 DIAGNOSIS — M0579 Rheumatoid arthritis with rheumatoid factor of multiple sites without organ or systems involvement: Secondary | ICD-10-CM | POA: Diagnosis not present

## 2019-04-13 DIAGNOSIS — M170 Bilateral primary osteoarthritis of knee: Secondary | ICD-10-CM

## 2019-04-13 DIAGNOSIS — M069 Rheumatoid arthritis, unspecified: Secondary | ICD-10-CM

## 2019-04-13 DIAGNOSIS — Z87448 Personal history of other diseases of urinary system: Secondary | ICD-10-CM

## 2019-04-13 DIAGNOSIS — Z8679 Personal history of other diseases of the circulatory system: Secondary | ICD-10-CM

## 2019-04-13 MED ORDER — LIDOCAINE HCL 1 % IJ SOLN
2.0000 mL | INTRAMUSCULAR | Status: AC | PRN
  Administered 2019-04-13: 2 mL

## 2019-04-13 NOTE — Telephone Encounter (Signed)
Submitted a Prior Authorization request to [facility] for [facility] via Cover My Meds. Will update once we receive a response.  Patient will have to use [facility]. 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  11:17 AM Beatriz Chancellor, CPhT

## 2019-04-13 NOTE — Progress Notes (Signed)
Pharmacy Note  Subjective: Patient presents today to the [facility] to see Dr. Estanislado Pandy.  Patient seen by the pharmacist for counseling on subcutaneous Orencia rheumatoid arthritis.  Prior therapy includes: Humira and Enbrel (inadequate response), MTX (GI upset and elevated creatinine), Arava (diarrhea). He is not a good candidate for diabetic retinopathy and CKD stage 3.  His last dose of Enbrel was on 9/6.  He is unable to tolerate prednisone.  Objective: CBC    Component Value Date/Time   WBC 11.3 (H) 03/21/2019 1253   RBC 4.45 03/21/2019 1253   HGB 14.0 03/21/2019 1253   HGB 13.2 03/15/2018 0846   HCT 41.4 03/21/2019 1253   PLT 231 03/21/2019 1253   PLT 199 03/15/2018 0846   MCV 93.0 03/21/2019 1253   MCH 31.5 03/21/2019 1253   MCHC 33.8 03/21/2019 1253   RDW 12.4 03/21/2019 1253   LYMPHSABS 4,068 (H) 03/21/2019 1253   MONOABS 0.6 03/15/2018 0846   EOSABS 723 (H) 03/21/2019 1253   BASOSABS 124 03/21/2019 1253    CMP     Component Value Date/Time   NA 138 03/21/2019 1253   K 4.7 03/21/2019 1253   CL 103 03/21/2019 1253   CO2 28 03/21/2019 1253   GLUCOSE 227 (H) 03/21/2019 1253   BUN 12 03/21/2019 1253   CREATININE 1.43 (H) 03/21/2019 1253   CALCIUM 9.2 03/21/2019 1253   PROT 6.8 03/21/2019 1253   ALBUMIN 3.7 03/15/2018 0846   AST 20 03/21/2019 1253   AST 17 03/15/2018 0846   ALT 21 03/21/2019 1253   ALT 17 03/15/2018 0846   ALKPHOS 97 03/15/2018 0846   BILITOT 0.4 03/21/2019 1253   BILITOT 0.3 03/15/2018 0846   GFRNONAA 55 (L) 03/21/2019 1253   GFRAA 63 03/21/2019 1253    Baseline Immunosuppressant Therapy Labs TB GOLD Quantiferon TB Gold Latest Ref Rng & Units 03/21/2019  Quantiferon TB Gold Plus NEGATIVE NEGATIVE   Hepatitis Panel: negative 01/27/2010  HIV: pending next OV  Immunoglobulins Immunoglobulin Electrophoresis Latest Ref Rng & Units 09/09/2017  IgG 700 - 1,600 mg/dL 1,276  IgM 20 - 172 mg/dL 130   SPEP: pending next  OV  G6PD No results found for: G6PDH TPMT No results found for: TPMT   Chest x-ray: no active cardiopulmonary disease 12/06/2015  Does patient have a diagnosis of COPD? No.  He currently smokes.  Encouraged smoking cessation.  Advised patient to notify office if respiratory symptoms worsen.  Does patient have history of diverticulitis?  No  Assessment/Plan:  Counseled patient that [name] is a selective T-cell costimulation blocker. Counseled patient on purpose, proper use, and adverse effects of Orencia. The most common adverse effects are increased risk of infections, headache, and injection site reactions.  There is the possibility of an increased risk of malignancy but it is not well understood if this increased risk is due to the medication or the disease state. Reviewed risk of GI perforation which is higher in patients with diverticulitis and diabetes.  Counseled patient that [name] should be held prior to scheduled surgery.  Counseled patient to avoid live vaccines while on Orencia.  Recommend annual influenza, Pneumovax 23, Prevnar 13, and Shingrix as indicated.   Reviewed the importance of regular labs while on Orencia therapy. Patient will be due for labs 1 month after starting therapy. Standing orders placed. Provided patient with medication education material and answered all questions.  Patient consented to [facility]  Will upload consent into patient's chart.  Will apply for Orencia  through patient's insurance.  Reviewed storage information for Orencia.  Advised initial injection must be administered in office.    Patient dose will be Orencia 125 mg every 7 days.  Prescription pending lab results and/or insurance approval.  Patient given co-pay card in office with instructions to activate after notified of insurance approval.  He must fill through [employer] per insurance.  All questions encouraged and answered. Instructed patient to call with any questions or  concerns.  Verlin Fester, PharmD, [place], CPP Clinical Specialty Pharmacist [phone]  04/13/2019 10:43 AM

## 2019-04-13 NOTE — Telephone Encounter (Signed)
Please start BIV for Orencia.  Prior therapy includes: Humira and Enbrel (inadequate response), MTX (GI upset and elevated creatinine), Arava (diarrhea). He is not a good candidate for diabetic retinopathy and CKD stage 3.  Patient dose will be Orencia 125 mg every 7 days.  Prescription pending lab results and/or insurance approval.

## 2019-04-14 NOTE — Telephone Encounter (Signed)
Received message from [facility] in reference to [place] Prior Authorization:  This medication or product is on your plan's list of covered drugs. Prior authorization is not required at this time. If your pharmacy has questions regarding the processing of your prescription, please have them call the [facility] pharmacy help desk at [phone]  Per plan, patient must fill through [facility]. Patient is copay card eligible.  10:10 AM Beatriz Chancellor, CPhT

## 2019-04-14 NOTE — Telephone Encounter (Signed)
Patient notified.  He was given a co-pay card during office visit.  Schedule new start visit for 9/14 at [place], PharmD, [place], [place] Clinical Specialty Pharmacist [phone]  04/14/2019 1:20 PM

## 2019-04-18 ENCOUNTER — Other Ambulatory Visit: Payer: Self-pay

## 2019-04-18 ENCOUNTER — Ambulatory Visit (INDEPENDENT_AMBULATORY_CARE_PROVIDER_SITE_OTHER): Payer: 59 | Admitting: Pharmacist

## 2019-04-18 VITALS — BP 122/55 | HR 64

## 2019-04-18 DIAGNOSIS — M0579 Rheumatoid arthritis with rheumatoid factor of multiple sites without organ or systems involvement: Secondary | ICD-10-CM | POA: Diagnosis not present

## 2019-04-18 MED ORDER — ORENCIA CLICKJECT 125 MG/ML ~~LOC~~ SOAJ: 125.0000 mg | SUBCUTANEOUS | 0 refills | Status: DC

## 2019-04-18 MED ORDER — ORENCIA CLICKJECT 125 MG/ML ~~LOC~~ SOAJ: 125.0000 mg | SUBCUTANEOUS | 2 refills | Status: DC

## 2019-04-18 NOTE — Progress Notes (Signed)
Pharmacy Note  Subjective: Patient presents today to the [facility] to see Dr. Corliss Skains.  Patient seen by the pharmacist for counseling on subcutaneous Orencia rheumatoid arthritis.  Prior therapy includes: Humira and Enbrel (inadequate response), MTX (GI upset and elevated creatinine), Arava (diarrhea). He is not a good candidate for diabetic retinopathy and CKD stage 3.  His last dose of Enbrel was on 9/6.  He is unable to tolerate prednisone.  Objective: CBC    Component Value Date/Time   WBC 11.3 (H) 03/21/2019 1253   RBC 4.45 03/21/2019 1253   HGB 14.0 03/21/2019 1253   HGB 13.2 03/15/2018 0846   HCT 41.4 03/21/2019 1253   PLT 231 03/21/2019 1253   PLT 199 03/15/2018 0846   MCV 93.0 03/21/2019 1253   MCH 31.5 03/21/2019 1253   MCHC 33.8 03/21/2019 1253   RDW 12.4 03/21/2019 1253   LYMPHSABS 4,068 (H) 03/21/2019 1253   MONOABS 0.6 03/15/2018 0846   EOSABS 723 (H) 03/21/2019 1253   BASOSABS 124 03/21/2019 1253    CMP     Component Value Date/Time   NA 138 03/21/2019 1253   K 4.7 03/21/2019 1253   CL 103 03/21/2019 1253   CO2 28 03/21/2019 1253   GLUCOSE 227 (H) 03/21/2019 1253   BUN 12 03/21/2019 1253   CREATININE 1.43 (H) 03/21/2019 1253   CALCIUM 9.2 03/21/2019 1253   PROT 6.8 03/21/2019 1253   ALBUMIN 3.7 03/15/2018 0846   AST 20 03/21/2019 1253   AST 17 03/15/2018 0846   ALT 21 03/21/2019 1253   ALT 17 03/15/2018 0846   ALKPHOS 97 03/15/2018 0846   BILITOT 0.4 03/21/2019 1253   BILITOT 0.3 03/15/2018 0846   GFRNONAA 55 (L) 03/21/2019 1253   GFRAA 63 03/21/2019 1253    Baseline Immunosuppressant Therapy Labs TB GOLD Quantiferon TB Gold Latest Ref Rng & Units 03/21/2019  Quantiferon TB Gold Plus NEGATIVE NEGATIVE   Hepatitis Panel: negative 01/27/2010  HIV: pending next labs  Immunoglobulins Immunoglobulin Electrophoresis Latest Ref Rng & Units 09/09/2017  IgG 700 - 1,600 mg/dL 4,481  IgM 20 - 856 mg/dL 314   SPEP: pending next  labs  G6PD No results found for: G6PDH TPMT No results found for: TPMT   Chest x-ray:no active cardiopulmonary disease 12/06/2015  Does patient have a diagnosis of COPD? No  Does patient have history of diverticulitis?  No  Assessment/Plan:  Counseled patient that Dub Amis is a selective T-cell costimulation blocker.  Counseled patient on purpose, proper use, and adverse effects of Orencia. The most common adverse effects are increased risk of infections, headache, and injection site reactions.  There is the possibility of an increased risk of malignancy but it is not well understood if this increased risk is due to the medication or the disease state. Reviewed risk of GI perforation which is higher in patients with diverticulitis and diabetes.  Counseled patient that Dub Amis should be held prior to scheduled surgery.  Counseled patient to avoid live vaccines while on Orencia.  Recommend annual influenza, Pneumovax 23, Prevnar 13, and Shingrix as indicated.   Reviewed the importance of regular labs while on Orencia therapy. Patient will be due for labs 1 month after starting therapy. Standing orders placed. Provided patient with medication education material and answered all questions.  Patient consented to [facility].  Will upload consent into patient's chart.  Will apply for Orencia through patient's insurance.  Reviewed storage information for Orencia.  Advised initial injection must be administered in office.  Patient dose will be Orencia 125 mg every 7 days. He was given a co-pay card at last office visit.  Prescription sent to [facility].  Instructed patient to call pharmacy if he has not heard from them in the next 3 days.   Demonstrated proper injection technique with Orencia demo pen.  Patient able to demonstrate proper injection technique using the teach back method.  Patient self injected in the left lower abdomen with:  Sample Medication: Orencia ClickJect NDC:  [id number] Lot: [id number] Expiration: 05/2020  Patient tolerated well.  Observed for 30 mins in office for adverse reaction and none noted. Patient given a sample with same Lot/expiration in case of shipment delays.  All questions encouraged and answered.  Instructed patient to call with any other questions or concerns.  Mariella Saa, PharmD, [place], [place] [facility] Pharmacist [phone]  04/18/2019 9:56 AM

## 2019-04-18 NOTE — Patient Instructions (Signed)
Helpful Tips for Injecting To help alleviate pain and injection site reactions consider the following tips:  . Placing something cold (like and ice gel pack or cold water bottle) on the injection site just before cleansing with alcohol may help reduce pain . If you have a localized reaction (redness, mild swelling, warmth, and itching) you can use topical corticosteroids (hydrocortisone cream) or antihistamine (Claritin) to help minimize reaction the day of, the day before, and the day after injecting . Always inject this medication at room temperature (remove from refrigerator 15-20 minutes before injecting; this may help eliminate stinging). . Always rotate your injection sites using inside/outside thigh of both legs, abdomen divided into 4 quadrants (stay away from waist line, and 2" away from navel). . Do not inject into areas where skin is tender, bruised, red, or hard or where there are scars or stretch marks. . Always let alcohol dry on the skin before injecting. . Place a cold, damp towel or small ice pack on the injection site for 10 or 15 minutes every 1 to 2 hours if it hurts or is swollen.  

## 2019-04-23 ENCOUNTER — Encounter: Payer: Self-pay | Admitting: Rheumatology

## 2019-04-25 ENCOUNTER — Encounter: Payer: Self-pay | Admitting: Rheumatology

## 2019-04-25 DIAGNOSIS — M7022 Olecranon bursitis, left elbow: Secondary | ICD-10-CM

## 2019-05-01 ENCOUNTER — Encounter: Payer: Self-pay | Admitting: Orthopaedic Surgery

## 2019-05-01 ENCOUNTER — Encounter: Payer: Self-pay | Admitting: Rheumatology

## 2019-05-02 ENCOUNTER — Encounter: Payer: Self-pay | Admitting: Orthopaedic Surgery

## 2019-05-02 ENCOUNTER — Ambulatory Visit (INDEPENDENT_AMBULATORY_CARE_PROVIDER_SITE_OTHER): Payer: 59 | Admitting: Orthopaedic Surgery

## 2019-05-02 DIAGNOSIS — M79672 Pain in left foot: Secondary | ICD-10-CM

## 2019-05-02 DIAGNOSIS — M71022 Abscess of bursa, left elbow: Secondary | ICD-10-CM

## 2019-05-02 NOTE — Progress Notes (Signed)
Office Visit Note   Patient: [name]           Date of Birth: March 10, 1966           MRN: [id number] Visit Date: 05/02/2019              Requested by: [name], MD [address] [place],  [place] [address] PCP: [name], MD   Assessment & Plan: Visit Diagnoses:  1. Abscess of left olecranon bursa   2. Pain in left foot     Plan: In the office I performed a procedure of incision and drainage of the left elbow olecranon area.  I then closed with 3-0 nylon suture and placed Xeroform over it.  I gave him wound care instructions and I like to see him back this next week to assess the wound.  He will continue his antibiotics as well.  We will order an MRI for his left foot rule out stress fracture.  All question concerns were answered and addressed.  Follow-Up Instructions: Return in about 1 week (around 05/09/2019).   Orders:  No orders of the defined types were placed in this encounter.  No orders of the defined types were placed in this encounter.     Procedures: Incision & Drainage  Date/Time: 05/02/2019 2:30 PM Performed by: Kathryne Hitch, MD Authorized by: Kathryne Hitch, MD   Consent:    Consent obtained:  Verbal   Consent given by:  Patient Location:    Type:  Abscess   Location:  Upper extremity   Upper extremity location:  Elbow Pre-procedure details:    Skin preparation:  Betadine Anesthesia (see MAR for exact dosages):    Anesthesia method:  Local infiltration Procedure type:    Complexity:  Simple Procedure details:    Incision types:  Single straight   Incision depth:  Subfascial   Scalpel blade:  15   Wound management:  Irrigated with saline, debrided and extensive cleaning   Drainage:  Purulent   Packing materials:  None     Clinical Data: No additional findings.   Subjective: Chief Complaint  Patient presents with  . Left Elbow - Pain, Wound Check  The patient is referred today from Dr. Ivery Quale as well as [facility] to evaluate an infection involving his left elbow bursa.  He is also a patient of Dr. Sherron Ales.  He has been dealing with an acute elbow bursitis that is now become infected.  He had it aspirated at the urgent care this weekend and they put him on antibiotics and told him he would need a surgery on the elbow.  He is also been dealing with acute left foot pain.  X-rays apparently negative for any type of significant injury but he still having pain the lateral aspect of his foot and he is in a cam walking boot.  He is a type I diabetic.  He has not had great blood glucose control but is been okay.  He is been doing a lot of exercise walking with his daughter try to lose weight and that is when his left foot started bothering him.  Of more urgency though is his left elbow.  HPI  Review of Systems He currently denies any headache, chest pain, shortness of breath, fever, chills, nausea, vomiting  Objective: Vital Signs: There were no vitals taken for this visit.  Physical Exam He is alert and orient x3 and in no acute distress Ortho Exam Examination  of his left elbow shows fullness of the olecranon bursal area.  There is redness and pain.  There is a small open wound and there is purulence draining from this.  His left foot has pain over the fifth ray at the mid metatarsal to the base of the metatarsal.  He is able to invert his foot and is able to dorsiflex the foot but is very painful along the fifth ray. Specialty Comments:  No specialty comments available.  Imaging: No results found.   PMFS History: Patient Active Problem List   Diagnosis Date Noted  . Chronic venous insufficiency 10/05/2018  . Controlled type 1 diabetes mellitus with diabetic peripheral angiopathy without gangrene ([place]) 08/14/2017  . Dyslipidemia 03/26/2017  . High risk medication use 09/23/2016  . History of hypertension 09/23/2016  . History of chronic kidney disease  09/23/2016  . History of coronary artery disease 09/23/2016  . Tobacco abuse 09/23/2016  . Primary osteoarthritis of both knees 09/23/2016  . History of diabetes mellitus 09/23/2016  . 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 Chest pain with high risk for cardiac etiology 10/29/2015  . Asymptomatic bilateral carotid artery stenosis 06/08/2014  . Pleural effusion, bilateral 06/03/2014  . Dyspnea 06/01/2014  . Diabetes ([place]) 05/03/2014  . Rheumatoid arthritis involving multiple joints ([place]) 05/03/2014  . 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 Chronic renal disease, stage 3, moderately decreased glomerular filtration rate between 30-59 mL/min/1.73 square meter ([place]) 05/03/2014  . CAD (coronary artery disease) 04/27/2014   Past Medical History:  Diagnosis Date  . Anginal pain ([place])   . Arthritis    RA IN HANDS  . Asthma   . CAD (coronary artery disease) 04/27/2014  . Chronic renal disease, stage 3, moderately decreased glomerular filtration rate between 30-59 mL/min/1.73 square meter ([facility]) 05/03/2014  . Coronary artery disease   . Diabetes ([place]) 05/03/2014  . Diabetes mellitus without complication ([place])    type 1  . Hyperlipidemia   . Left shoulder pain   . Rheumatoid arthritis involving multiple joints ([place]) 05/03/2014  . Shortness of breath   . Sleep apnea    mild OSA, no CPAP  . Unstable angina pectoris ([place]) 04/25/2014    Family History  Problem Relation Age of Onset  . Prostate cancer Father   . Heart disease Father   . Stomach cancer Mother   . Heart disease Brother   . Asthma Daughter   . Healthy Daughter     Past Surgical History:  Procedure Laterality Date  . CARDIAC CATHETERIZATION  04/25/2014   BY DR Einar Gip  . CARDIAC CATHETERIZATION N/A 10/30/2015   Procedure: Left Heart Cath and Cors/Grafts Angiography;  Surgeon: Adrian Prows, MD;  Location: [facility];  Service: Cardiovascular;  Laterality: N/A;  . CARPAL TUNNEL RELEASE    . CORONARY ARTERY BYPASS GRAFT N/A 04/27/2014    Procedure: CORONARY ARTERY BYPASS GRAFTING on pump using left internal mammary artery to LAD coronary artery, right great saphenous vein graft to diagonal coronary artery with sequential to OM1 and circumflex coronary arteries. Right greater saphenous vein graft to posterior descending coronary artery. ;  Surgeon: Grace Isaac, MD;  Location: [facility];  Service: Open Heart Surgery;  Laterality: N  . ENDOVEIN HARVEST OF GREATER SAPHENOUS VEIN Right 04/27/2014   Procedure: ENDOVEIN HARVEST OF GREATER SAPHENOUS VEIN;  Surgeon: Grace Isaac, MD;  Location: [facility];  Service: Open Heart Surgery;  Laterality: Right;  . EXCISION ORAL TUMOR N/A 03/01/2018   Procedure:  EXCISION ORAL TUMOR;  Surgeon: Christia Reading, MD;  Location: [facility];  Service: ENT;  Laterality: N/A;  . EYE SURGERY     LASER  . INTRAOPERATIVE TRANSESOPHAGEAL ECHOCARDIOGRAM N/A 04/27/2014   Procedure: INTRAOPERATIVE TRANSESOPHAGEAL ECHOCARDIOGRAM;  Surgeon: Delight Ovens, MD;  Location: [facility];  Service: Open Heart Surgery;  Laterality: N/A;  . LEFT HEART CATHETERIZATION WITH CORONARY ANGIOGRAM N/A 04/25/2014   Procedure: LEFT HEART CATHETERIZATION WITH CORONARY ANGIOGRAM;  Surgeon: Pamella Pert, MD;  Location: [facility];  Service: Cardiovascular;  Laterality: N/A;  . MOUTH SURGERY  11/15/2017   tongue surgery    Social History   Occupational History  . Not on file  Tobacco Use  . Smoking status: Current Every Day Smoker    Packs/day: 0.50    Years: 34.00    Pack years: 17.00    Types: Cigarettes  . Smokeless tobacco: Never Used  . Tobacco comment: 10 per day   Substance and Sexual Activity  . Alcohol use: Yes    Alcohol/week: 0.0 standard drinks    Comment: RARE  . Drug use: No  . Sexual activity: Not on file

## 2019-05-03 ENCOUNTER — Other Ambulatory Visit: Payer: Self-pay

## 2019-05-03 ENCOUNTER — Ambulatory Visit: Payer: 59 | Admitting: Physician Assistant

## 2019-05-03 ENCOUNTER — Ambulatory Visit: Payer: 59 | Admitting: Orthopaedic Surgery

## 2019-05-03 DIAGNOSIS — M79672 Pain in left foot: Secondary | ICD-10-CM

## 2019-05-04 NOTE — Progress Notes (Deleted)
Office Visit Note  Patient: [name]             Date of Birth: 01-Sep-1965           MRN: [id number]             PCP: Leanna Battles, MD Referring: Leanna Battles, MD Visit Date: 05/18/2019 Occupation: @GUAROCC @  Subjective:  No chief complaint on file.  Seen in the ED on 05/02/2019 for septic bursitis and placed on antibiotics. [name] Clickject 403 mg every 7 days started in September 2020.  Last TB gold negative on 03/21/2019 and will monitor yearly.  Most recent CBC/CMP within normal limits on 03/21/2019.      History of Present Illness: [name] is a 53 y.o. male ***   Activities of Daily Living:  Patient reports morning stiffness for *** {minute/hour:19697}.   Patient {ACTIONS;DENIES/REPORTS:21021675::"Denies"} nocturnal pain.  Difficulty dressing/grooming: {ACTIONS;DENIES/REPORTS:21021675::"Denies"} Difficulty climbing stairs: {ACTIONS;DENIES/REPORTS:21021675::"Denies"} Difficulty getting out of chair: {ACTIONS;DENIES/REPORTS:21021675::"Denies"} Difficulty using hands for taps, buttons, cutlery, and/or writing: {ACTIONS;DENIES/REPORTS:21021675::"Denies"}  No Rheumatology ROS completed.   PMFS History:  Patient Active Problem List   Diagnosis Date Noted  . Chronic venous insufficiency 10/05/2018  . Controlled type 1 diabetes mellitus with diabetic peripheral angiopathy without gangrene ([place]) 08/14/2017  . Dyslipidemia 03/26/2017  . High risk medication use 09/23/2016  . History of hypertension 09/23/2016  . History of chronic kidney disease 09/23/2016  . History of coronary artery disease 09/23/2016  . Tobacco abuse 09/23/2016  . Primary osteoarthritis of both knees 09/23/2016  . History of diabetes mellitus 09/23/2016  . 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 Chest pain with high risk for cardiac etiology 10/29/2015  . Asymptomatic bilateral carotid artery stenosis 06/08/2014  . Pleural effusion, bilateral 06/03/2014  . Dyspnea 06/01/2014   . Diabetes ([place]) 05/03/2014  . Rheumatoid arthritis involving multiple joints ([place]) 05/03/2014  . 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 Chronic renal disease, stage 3, moderately decreased glomerular filtration rate between 30-59 mL/min/1.73 square meter ([place]) 05/03/2014  . CAD (coronary artery disease) 04/27/2014    Past Medical History:  Diagnosis Date  . Anginal pain ([place])   . Arthritis    RA IN HANDS  . Asthma   . CAD (coronary artery disease) 04/27/2014  . Chronic renal disease, stage 3, moderately decreased glomerular filtration rate between 30-59 mL/min/1.73 square meter ([facility]) 05/03/2014  . Coronary artery disease   . Diabetes ([place]) 05/03/2014  . Diabetes mellitus without complication ([place])    type 1  . Hyperlipidemia   . Left shoulder pain   . Rheumatoid arthritis involving multiple joints ([place]) 05/03/2014  . Shortness of breath   . Sleep apnea    mild OSA, no CPAP  . Unstable angina pectoris ([place]) 04/25/2014    Family History  Problem Relation Age of Onset  . Prostate cancer Father   . Heart disease Father   . Stomach cancer Mother   . Heart disease Brother   . Asthma Daughter   . Healthy Daughter    Past Surgical History:  Procedure Laterality Date  . CARDIAC CATHETERIZATION  04/25/2014   BY DR Einar Gip  . CARDIAC CATHETERIZATION N/A 10/30/2015   Procedure: Left Heart Cath and Cors/Grafts Angiography;  Surgeon: Adrian Prows, MD;  Location: [facility];  Service: Cardiovascular;  Laterality: N/A;  . CARPAL TUNNEL RELEASE    . CORONARY ARTERY BYPASS GRAFT N/A 04/27/2014   Procedure: CORONARY ARTERY BYPASS GRAFTING on pump using left internal mammary artery  to LAD coronary artery, right great saphenous vein graft to diagonal coronary artery with sequential to OM1 and circumflex coronary arteries. Right greater saphenous vein graft to posterior descending coronary artery. ;  Surgeon: Delight Ovens, MD;  [facility];  Service: Open Heart Surgery;  Laterality: N  . ENDOVEIN  HARVEST OF GREATER SAPHENOUS VEIN Right 04/27/2014   Procedure: ENDOVEIN HARVEST OF GREATER SAPHENOUS VEIN;  Surgeon: Delight Ovens, MD;  Location: [facility];  Service: Open Heart Surgery;  Laterality: Right;  . EXCISION ORAL TUMOR N/A 03/01/2018   Procedure: EXCISION ORAL TUMOR;  Surgeon: Christia Reading, MD;  Location: [facility];  Service: ENT;  Laterality: N/A;  . EYE SURGERY     LASER  . INTRAOPERATIVE TRANSESOPHAGEAL ECHOCARDIOGRAM N/A 04/27/2014   Procedure: INTRAOPERATIVE TRANSESOPHAGEAL ECHOCARDIOGRAM;  Surgeon: Delight Ovens, MD;  Location: [facility] OR;  Service: Open Heart Surgery;  Laterality: N/A;  . LEFT HEART CATHETERIZATION WITH CORONARY ANGIOGRAM N/A 04/25/2014   Procedure: LEFT HEART CATHETERIZATION WITH CORONARY ANGIOGRAM;  Surgeon: Pamella Pert, MD;  Location: [facility];  Service: Cardiovascular;  Laterality: N/A;  . MOUTH SURGERY  11/15/2017   tongue surgery    Social History   Social History Narrative  . Not on file    There is no immunization history on file for this patient.   Objective: Vital Signs: There were no vitals taken for this visit.   Physical Exam   Musculoskeletal Exam: ***  CDAI Exam: CDAI Score: - Patient Global: -; Provider Global: - Swollen: -; Tender: - Joint Exam   No joint exam has been documented for this visit   There is currently no information documented on the homunculus. Go to the Rheumatology activity and complete the homunculus joint exam.  Investigation: No additional findings.  Imaging: No results found.  Recent Labs: Lab Results  Component Value Date   WBC 11.3 (H) 03/21/2019   HGB 14.0 03/21/2019   PLT 231 03/21/2019   NA 138 03/21/2019   K 4.7 03/21/2019   CL 103 03/21/2019   CO2 28 03/21/2019   GLUCOSE 227 (H) 03/21/2019   BUN 12 03/21/2019   CREATININE 1.43 (H) 03/21/2019   BILITOT 0.4 03/21/2019   ALKPHOS 97 03/15/2018   AST 20 03/21/2019   ALT 21 03/21/2019   PROT 6.8 03/21/2019    ALBUMIN 3.7 03/15/2018   CALCIUM 9.2 03/21/2019   GFRAA 63 03/21/2019   QFTBGOLDPLUS NEGATIVE 03/21/2019    Speciality Comments: Prior therapy includes: Humira and Enbrel (inadequate response), MTX (GI upset and elevated creatinine), Arava (diarrhea). He is not a good candidate for diabetic retinopathy and CKD stage 3.    Procedures:  No procedures performed Allergies: Morphine and related   Assessment / Plan:     Visit Diagnoses: No diagnosis found.  Orders: No orders of the defined types were placed in this encounter.  No orders of the defined types were placed in this encounter.   Face-to-face time spent with patient was *** minutes. Greater than 50% of time was spent in counseling and coordination of care.  Follow-Up Instructions: No follow-ups on file.   Ellen Henri, CMA  Note - This record has been created using Animal nutritionist.  Chart creation errors have been sought, but may not always  have been located. Such creation errors do not reflect on  the standard of medical care.

## 2019-05-06 ENCOUNTER — Ambulatory Visit (INDEPENDENT_AMBULATORY_CARE_PROVIDER_SITE_OTHER): Payer: 59 | Admitting: Family Medicine

## 2019-05-06 ENCOUNTER — Telehealth: Payer: Self-pay | Admitting: Orthopaedic Surgery

## 2019-05-06 ENCOUNTER — Ambulatory Visit: Payer: Self-pay

## 2019-05-06 ENCOUNTER — Encounter: Payer: Self-pay | Admitting: Family Medicine

## 2019-05-06 ENCOUNTER — Encounter: Payer: Self-pay | Admitting: Orthopaedic Surgery

## 2019-05-06 DIAGNOSIS — S92355D Nondisplaced fracture of fifth metatarsal bone, left foot, subsequent encounter for fracture with routine healing: Secondary | ICD-10-CM | POA: Diagnosis not present

## 2019-05-06 DIAGNOSIS — M79672 Pain in left foot: Secondary | ICD-10-CM | POA: Diagnosis not present

## 2019-05-06 DIAGNOSIS — M71022 Abscess of bursa, left elbow: Secondary | ICD-10-CM

## 2019-05-06 MED ORDER — TRAMADOL HCL 50 MG PO TABS: 50.0000 mg | ORAL_TABLET | Freq: Four times a day (QID) | ORAL | 0 refills | Status: DC | PRN

## 2019-05-06 MED ORDER — CLINDAMYCIN HCL 300 MG PO CAPS: 300.0000 mg | ORAL_CAPSULE | Freq: Three times a day (TID) | ORAL | 0 refills | Status: DC

## 2019-05-06 MED ORDER — ZINC 30 MG PO CAPS: 30.0000 mg | ORAL_CAPSULE | Freq: Every day | ORAL | 3 refills | Status: DC

## 2019-05-06 NOTE — Telephone Encounter (Signed)
I worked him in with Dr. Junius Roads per Dr. [name]

## 2019-05-06 NOTE — Progress Notes (Signed)
Office Visit Note   Patient: [name]           Date of Birth: [birth year]           MRN: [id number] Visit Date: 05/06/2019 Requested by: Leanna Battles, MD [place],  [place] [id number] PCP: Leanna Battles, MD  Subjective: Chief Complaint  Patient presents with  . Left Elbow - Follow-up    Just had surgery on the elbow Monday 9/28 (Dr. [name]) - "green pus coming out of the incision." Sutures intact. Been doing postop care as instructed - peroxide, cream, bandage changes and abx.  . Left Foot - Pain    Heard a pop in the foot after stepping out of the shower yesterday. Had been seen at [facility] a week earlier for the same foot. Wearing a fracture boot.    HPI: He is here withWorsening left foot pain.  He was seen at [facility] a week ago and was thought to have a possible stress fracture but x-rays were equivocal.  He was scheduled for an MRI scan.  Yesterday after getting out of the shower he took a few steps and heard something pop and has felt immediate pain on the side of his foot, difficult to bear weight on it since then.  He also wants me to look at his left elbow.  He had irrigation and debridement of olecranon bursa on September 28.  He feels like it is draining more than it should, yellow discoloration.  No fevers or chills, no pain with movement of his elbow.  He is diabetic and a smoker.              ROS:   All other systems were reviewed and are negative.  Objective: Vital Signs: There were no vitals taken for this visit.  Physical Exam:  General:  Alert and oriented, in no acute distress. Pulm:  Breathing unlabored. Psy:  Normal mood, congruent affect. Skin: Left elbow incision is clean, very slight drainage of clear yellow fluid.  There is no warmth, he has a pink color, not erythematous.  I took a Clinical research associate for his chart. Left foot: Tender to palpation on the lateral aspect of his foot, proximal fifth metatarsal.  Imaging: X-rays  left foot: He has a nondisplaced proximal fifth metatarsal fracture.   Assessment & Plan: 1.  Left foot fifth metatarsal fracture, nondisplaced. -Crutches, weightbearing when tolerated using his fracture boot.  Repeat x-rays in 2 to 3 weeks.  Encouraged him to quit smoking if able.  2.  Left elbow olecranon bursitis, seems to be stable but patient is concerned that it is getting worse.. -We will switch to clindamycin.  Follow-up on Monday with Dr. [name].     Procedures: No procedures performed  No notes on file     PMFS History: Patient Active Problem List   Diagnosis Date Noted  . Chronic venous insufficiency 10/05/2018  . Controlled type 1 diabetes mellitus with diabetic peripheral angiopathy without gangrene ([place]) 08/14/2017  . Dyslipidemia 03/26/2017  . High risk medication use 09/23/2016  . History of hypertension 09/23/2016  . History of chronic kidney disease 09/23/2016  . History of coronary artery disease 09/23/2016  . Tobacco abuse 09/23/2016  . Primary osteoarthritis of both knees 09/23/2016  . History of diabetes mellitus 09/23/2016  . 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 Chest pain with high risk for cardiac etiology 10/29/2015  . Asymptomatic bilateral carotid artery stenosis 06/08/2014  . Pleural effusion, bilateral 06/03/2014  .  Dyspnea 06/01/2014  . Diabetes (HCC) 05/03/2014  . 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/03/2014  . 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 Chronic renal disease, stage 3, moderately decreased glomerular filtration rate between 30-59 mL/min/1.73 square meter 05/03/2014  . CAD (coronary artery disease) 04/27/2014   Past Medical History:  Diagnosis Date  . Anginal pain (HCC)   . Arthritis    RA IN HANDS  . Asthma   . CAD (coronary artery disease) 04/27/2014  . Chronic renal disease, stage 3, moderately decreased glomerular filtration rate between 30-59 mL/min/1.73 square meter 05/03/2014  . Coronary artery disease    . Diabetes (HCC) 05/03/2014  . Diabetes mellitus without complication (HCC)    type 1  . Hyperlipidemia   . Left shoulder pain   . 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/03/2014  . Shortness of breath   . Sleep apnea    mild OSA, no CPAP  . Unstable angina pectoris (HCC) 04/25/2014    Family History  Problem Relation Age of Onset  . Prostate cancer Father   . Heart disease Father   . Stomach cancer Mother   . Heart disease Brother   . Asthma Daughter   . Healthy Daughter     Past Surgical History:  Procedure Laterality Date  . CARDIAC CATHETERIZATION  04/25/2014   BY DR Jacinto Halim  . CARDIAC CATHETERIZATION N/A 10/30/2015   Procedure: Left Heart Cath and Cors/Grafts Angiography;  Surgeon: Yates Decamp, MD;  Location: [facility] INVASIVE CV LAB;  Service: Cardiovascular;  Laterality: N/A;  . CARPAL TUNNEL RELEASE    . CORONARY ARTERY BYPASS GRAFT N/A 04/27/2014   Procedure: CORONARY ARTERY BYPASS GRAFTING on pump using left internal mammary artery to LAD coronary artery, right great saphenous vein graft to diagonal coronary artery with sequential to OM1 and circumflex coronary arteries. Right greater saphenous vein graft to posterior descending coronary artery. ;  Surgeon: Delight Ovens, MD;  Location: [facility] OR;  Service: Open Heart Surgery;  Laterality: N  . ENDOVEIN HARVEST OF GREATER SAPHENOUS VEIN Right 04/27/2014   Procedure: ENDOVEIN HARVEST OF GREATER SAPHENOUS VEIN;  Surgeon: Delight Ovens, MD;  Location: [facility] OR;  Service: Open Heart Surgery;  Laterality: Right;  . EXCISION ORAL TUMOR N/A 03/01/2018   Procedure: EXCISION ORAL TUMOR;  Surgeon: Christia Reading, MD;  Location: [facility];  Service: ENT;  Laterality: N/A;  . EYE SURGERY     LASER  . INTRAOPERATIVE TRANSESOPHAGEAL ECHOCARDIOGRAM N/A 04/27/2014   Procedure: INTRAOPERATIVE TRANSESOPHAGEAL ECHOCARDIOGRAM;  Surgeon: Delight Ovens, MD;  Location: [place] OR;  Service: Open Heart Surgery;  Laterality: N/A;   . LEFT HEART CATHETERIZATION WITH CORONARY ANGIOGRAM N/A 04/25/2014   Procedure: LEFT HEART CATHETERIZATION WITH CORONARY ANGIOGRAM;  Surgeon: Pamella Pert, MD;  Location: [facility] CATH LAB;  Service: Cardiovascular;  Laterality: N/A;  . MOUTH SURGERY  11/15/2017   tongue surgery    Social History   Occupational History  . Not on file  Tobacco Use  . Smoking status: Current Every Day Smoker    Packs/day: 0.50    Years: 34.00    Pack years: 17.00    Types: Cigarettes  . Smokeless tobacco: Never Used  . Tobacco comment: 10 per day   Substance and Sexual Activity  . Alcohol use: Yes    Alcohol/week: 0.0 standard drinks    Comment: RARE  . Drug use: No  . Sexual activity: Not on file

## 2019-05-06 NOTE — Telephone Encounter (Signed)
Pt called in said he was taking a shower and when he got out of the shower he believes he stepped wrong on his left foot and he heard and felt it pop and not cant put any pressure on it and pt said it hurts pretty bad believes it may be broken.  Pt also says he has puss/fluid coming out of the left elbow where dr.blackman made an incision.    [phone]

## 2019-05-06 NOTE — Telephone Encounter (Signed)
Pt lmom stating that his arm is still infected and that when he got out the tub last night he did not have his boot on and when he stepped down he heard a loud crack/pop sound. He thinks he broke his foot.  Pt's call back # [phone].

## 2019-05-08 ENCOUNTER — Encounter: Payer: Self-pay | Admitting: Family Medicine

## 2019-05-09 ENCOUNTER — Encounter: Payer: Self-pay | Admitting: Orthopaedic Surgery

## 2019-05-09 ENCOUNTER — Ambulatory Visit (INDEPENDENT_AMBULATORY_CARE_PROVIDER_SITE_OTHER): Payer: 59 | Admitting: Orthopaedic Surgery

## 2019-05-09 DIAGNOSIS — M71022 Abscess of bursa, left elbow: Secondary | ICD-10-CM

## 2019-05-09 HISTORY — PX: OTHER SURGICAL HISTORY: SHX169

## 2019-05-09 NOTE — Progress Notes (Signed)
The patient comes in today for continued follow-up due to an infected left elbow olecranon bursa.  He is on several different antibiotics.  He is concerned about the drainage given the fact that he is a type I diabetic.  He also has been diagnosed with a fracture in his foot and is in a cam walker.  This was a fracture of his fifth metatarsal of the left foot.  On examination of his left elbow there is drainage from the elbow but is serous drainage.  There is no redness and no evidence of infection.  There is a small wound distally.  I explained in detail that this is what we see with an olecranon bursa and the wound.  He does not need IV antibiotics nor hospitalization and I&D.  However he does need to realize it is going to drain and I still watch it closely.  He is going to put Bactroban ointment on the wound daily and keep it clean and dry.  I would like to see him back in a week from today for repeat evaluation.  Given his elbow issues and his left foot fifth metatarsal fracture, we will need to keep him out of work for the foreseeable future.

## 2019-05-12 ENCOUNTER — Telehealth: Payer: Self-pay | Admitting: Rheumatology

## 2019-05-12 NOTE — Telephone Encounter (Signed)
Patient left a message asking if he needs to keep his 05/18/2019 appointment since he is now seeing Dr. [name] for his elbow? Please call to advise.

## 2019-05-12 NOTE — Telephone Encounter (Signed)
Appointment visit scheduled to follow-up on Orencia.  If is not for his elbow.  Have advised for him to keep the appointment.  If he wants to postpone the appointment for a few weeks that should be ok

## 2019-05-12 NOTE — Telephone Encounter (Signed)
Patient advised appointment visit scheduled to follow-up on Orencia.  If is not for his elbow.  Have advised for him to keep the appointment.  If he wants to postpone the appointment for a few weeks that should be ok. Patient postponed appointment until 06/01/19.

## 2019-05-13 ENCOUNTER — Telehealth: Payer: Self-pay | Admitting: Orthopaedic Surgery

## 2019-05-13 ENCOUNTER — Other Ambulatory Visit: Payer: Self-pay | Admitting: Orthopaedic Surgery

## 2019-05-13 MED ORDER — DOXYCYCLINE HYCLATE 100 MG PO CAPS: 100.0000 mg | ORAL_CAPSULE | Freq: Two times a day (BID) | ORAL | 0 refills | Status: DC

## 2019-05-13 MED ORDER — MOXIFLOXACIN HCL 400 MG PO TABS: 400.0000 mg | ORAL_TABLET | Freq: Every day | ORAL | 0 refills | Status: DC

## 2019-05-13 NOTE — Telephone Encounter (Signed)
Patient called needing Rx refilled Doxycycline and Moxifloxacin. Advised patient provider is in surgery. The number to contact patient is [phone]

## 2019-05-13 NOTE — Telephone Encounter (Signed)
Please advise on message below. Thank you!

## 2019-05-16 ENCOUNTER — Encounter: Payer: Self-pay | Admitting: Orthopaedic Surgery

## 2019-05-16 ENCOUNTER — Ambulatory Visit (INDEPENDENT_AMBULATORY_CARE_PROVIDER_SITE_OTHER): Payer: 59 | Admitting: Orthopaedic Surgery

## 2019-05-16 VITALS — Ht 68.0 in | Wt 185.0 lb

## 2019-05-16 DIAGNOSIS — M71022 Abscess of bursa, left elbow: Secondary | ICD-10-CM

## 2019-05-16 NOTE — Progress Notes (Signed)
The patient is continuing to follow-up for olecranon bursitis that was infected involving the left elbow.  We performed an irrigation debridement in the office.  He is on 2 antibiotics.  He is doing well overall.  He still changing the dressing daily and placing Bactroban ointment on the wound.  He is also dealing with a stress fracture of the fifth metatarsal on his left foot he is in a cam walking boot.  He is been taking it easy on that.  He currently denies any fever, chills, nausea, vomiting.  On examination of his left elbow his wound is improved dramatically.  There is no evidence of cellulitis and no drainage today.  There is a small wound.  We will continue to put Bactroban ointment on the wound daily.  We will continue his course of antibiotics until they are done.  I would like to see him back in 3 weeks for wound check as well as repeat 3 views of his left foot to assess the stress fracture.  I will give him a note to allow him to return to sedentary work starting October 19.  All question concerns were answered and addressed.

## 2019-05-18 ENCOUNTER — Ambulatory Visit: Payer: 59 | Admitting: Physician Assistant

## 2019-05-18 NOTE — Progress Notes (Signed)
Office Visit Note  Patient: [name]             Date of Birth: 07-25-1966           MRN: [id number]             PCP: Leanna Battles, MD Referring: Leanna Battles, MD Visit Date: 06/01/2019 Occupation: @GUAROCC @  Subjective:  Left elbow pain    History of Present Illness: [name] is a 53 y.o. male with history of seropositive rheumatoid arthritis.  He was started on Orencia 125 mg sq injections once weekly in September 2020. He is currently holding Orencia due to developing septic olecranon bursitis of the left elbow.  He has taken 3 rounds of antibiotics, and according to the patient the infection has cleared but he has not restarted on Orencia due to being apprehensive since he is a diabetic. He reports he had the bursa drained at [facility] in september and the culture was positive for staph.  He followed up with Dr. [name] who performed an I&D for an olecranon abscess on 05/02/19.  He has been following up with Dr. [name] very closely.   In the meantime, he fractured his left 5th metatarsal and is currently wearing a boot.  He denies any other joint pain or joint swelling at this time.   Activities of Daily Living:  Patient reports morning stiffness for 15   minutes.   Patient Reports nocturnal pain.  Difficulty dressing/grooming: Denies Difficulty climbing stairs: Denies Difficulty getting out of chair: Denies Difficulty using hands for taps, buttons, cutlery, and/or writing: Denies  Review of Systems  Constitutional: Positive for fatigue. Negative for night sweats.  HENT: Negative for mouth sores, mouth dryness and nose dryness.   Eyes: Negative for redness and dryness.  Respiratory: Negative for cough, hemoptysis, shortness of breath and difficulty breathing.   Cardiovascular: Negative for chest pain, palpitations, hypertension, irregular heartbeat and swelling in legs/feet.  Gastrointestinal: Negative for blood in stool, constipation and diarrhea.   Endocrine: Negative for increased urination.  Genitourinary: Negative for painful urination.  Musculoskeletal: Positive for arthralgias, joint pain and morning stiffness. Negative for joint swelling, myalgias, muscle weakness, muscle tenderness and myalgias.  Skin: Negative for color change, rash, hair loss, nodules/bumps, skin tightness, ulcers and sensitivity to sunlight.  Allergic/Immunologic: Positive for susceptible to infections.  Neurological: Negative for dizziness, fainting, memory loss, night sweats and weakness.  Hematological: Negative for swollen glands.  Psychiatric/Behavioral: Negative for depressed mood and sleep disturbance. The patient is nervous/anxious.     PMFS History:  Patient Active Problem List   Diagnosis Date Noted  . Chronic venous insufficiency 10/05/2018  . Controlled type 1 diabetes mellitus with diabetic peripheral angiopathy without gangrene ([place]) 08/14/2017  . Dyslipidemia 03/26/2017  . High risk medication use 09/23/2016  . History of hypertension 09/23/2016  . History of chronic kidney disease 09/23/2016  . History of coronary artery disease 09/23/2016  . Tobacco abuse 09/23/2016  . Primary osteoarthritis of both knees 09/23/2016  . History of diabetes mellitus 09/23/2016  . 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 Chest pain with high risk for cardiac etiology 10/29/2015  . Asymptomatic bilateral carotid artery stenosis 06/08/2014  . Pleural effusion, bilateral 06/03/2014  . Dyspnea 06/01/2014  . Diabetes ([place]) 05/03/2014  . Rheumatoid arthritis involving multiple joints ([place]) 05/03/2014  . 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 Chronic renal disease, stage 3, moderately decreased glomerular filtration rate between 30-59 mL/min/1.73 square meter 05/03/2014  . CAD (  coronary artery disease) 04/27/2014    Past Medical History:  Diagnosis Date  . Anginal pain (HCC)   . Arthritis    RA IN HANDS  . Asthma   . CAD (coronary artery disease) 04/27/2014  .  Chronic renal disease, stage 3, moderately decreased glomerular filtration rate between 30-59 mL/min/1.73 square meter 05/03/2014  . Coronary artery disease   . Diabetes (HCC) 05/03/2014  . Diabetes mellitus without complication (HCC)    type 1  . Hyperlipidemia   . Left shoulder pain   . 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/03/2014  . Shortness of breath   . Sleep apnea    mild OSA, no CPAP  . Unstable angina pectoris (HCC) 04/25/2014    Family History  Problem Relation Age of Onset  . Prostate cancer Father   . Heart disease Father   . Stomach cancer Mother   . Heart disease Brother   . Asthma Daughter   . Healthy Daughter    Past Surgical History:  Procedure Laterality Date  . CARDIAC CATHETERIZATION  04/25/2014   BY DR Jacinto Halim  . CARDIAC CATHETERIZATION N/A 10/30/2015   Procedure: Left Heart Cath and Cors/Grafts Angiography;  Surgeon: Yates Decamp, MD;  Location: [facility] INVASIVE CV LAB;  Service: Cardiovascular;  Laterality: N/A;  . CARPAL TUNNEL RELEASE    . CORONARY ARTERY BYPASS GRAFT N/A 04/27/2014   Procedure: CORONARY ARTERY BYPASS GRAFTING on pump using left internal mammary artery to LAD coronary artery, right great saphenous vein graft to diagonal coronary artery with sequential to OM1 and circumflex coronary arteries. Right greater saphenous vein graft to posterior descending coronary artery. ;  Surgeon: Delight Ovens, MD;  Location: [facility] OR;  Service: Open Heart Surgery;  Laterality: N  . elbow drained Left 05/09/2019  . ENDOVEIN HARVEST OF GREATER SAPHENOUS VEIN Right 04/27/2014   Procedure: ENDOVEIN HARVEST OF GREATER SAPHENOUS VEIN;  Surgeon: Delight Ovens, MD;  Location: [facility] OR;  Service: Open Heart Surgery;  Laterality: Right;  . EXCISION ORAL TUMOR N/A 03/01/2018   Procedure: EXCISION ORAL TUMOR;  Surgeon: Christia Reading, MD;  Location:  SURGERY CENTER;  Service: ENT;  Laterality: N/A;  . EYE SURGERY     LASER  . INTRAOPERATIVE TRANSESOPHAGEAL  ECHOCARDIOGRAM N/A 04/27/2014   Procedure: INTRAOPERATIVE TRANSESOPHAGEAL ECHOCARDIOGRAM;  Surgeon: Delight Ovens, MD;  Location: [facility] OR;  Service: Open Heart Surgery;  Laterality: N/A;  . LEFT HEART CATHETERIZATION WITH CORONARY ANGIOGRAM N/A 04/25/2014   Procedure: LEFT HEART CATHETERIZATION WITH CORONARY ANGIOGRAM;  Surgeon: Pamella Pert, MD;  Location: [facility] CATH LAB;  Service: Cardiovascular;  Laterality: N/A;  . MOUTH SURGERY  11/15/2017   tongue surgery    Social History   Social History Narrative  . Not on file    There is no immunization history on file for this patient.   Objective: Vital Signs: BP 119/69 (BP Location: Left Arm, Patient Position: Sitting, Cuff Size: Normal)   Pulse 62   Resp 14   Ht 5\' 8"  (1.727 m)   Wt 190 lb (86.2 kg)   BMI 28.89 kg/m    Physical Exam Vitals signs and nursing note reviewed.  Constitutional:      Appearance: He is well-developed.  HENT:     Head: Normocephalic and atraumatic.  Eyes:     Conjunctiva/sclera: Conjunctivae normal.     Pupils: Pupils are equal, round, and reactive to light.  Neck:     Musculoskeletal: Normal range of motion and neck supple.  Cardiovascular:     Rate and Rhythm: Normal rate and regular rhythm.     Heart sounds: Normal heart sounds.  Pulmonary:     Effort: Pulmonary effort is normal.     Breath sounds: Normal breath sounds.  Abdominal:     General: Bowel sounds are normal.     Palpations: Abdomen is soft.  Skin:    General: Skin is warm and dry.     Capillary Refill: Capillary refill takes less than 2 seconds.  Neurological:     Mental Status: He is alert and oriented to person, place, and time.  Psychiatric:        Behavior: Behavior normal.      Musculoskeletal Exam: C-spine, thoracic spine, and lumbar spine good ROM.  No midline spinal tenderness.  No SI joint tenderness.  Shoulder joints good ROM.  Right elbow joint contracture.   Limited ROM of the right wrist joint.  MCPs, PIPs, and  DIPs good ROM with no synovitis.  Complete fist formation bilaterally.  Hip joints good ROM with no discomfort.  Knee joints good ROM with no discomfort.  No warmth or effusion of knee joints.  Left fracture boot present.   CDAI Exam: CDAI Score: - Patient Global: -; Provider Global: - Swollen: -; Tender: - Joint Exam   No joint exam has been documented for this visit   There is currently no information documented on the homunculus. Go to the Rheumatology activity and complete the homunculus joint exam.  Investigation: No additional findings.  Imaging: No results found.  Recent Labs: Lab Results  Component Value Date   WBC 11.3 (H) 03/21/2019   HGB 14.0 03/21/2019   PLT 231 03/21/2019   NA 138 03/21/2019   K 4.7 03/21/2019   CL 103 03/21/2019   CO2 28 03/21/2019   GLUCOSE 227 (H) 03/21/2019   BUN 12 03/21/2019   CREATININE 1.43 (H) 03/21/2019   BILITOT 0.4 03/21/2019   ALKPHOS 97 03/15/2018   AST 20 03/21/2019   ALT 21 03/21/2019   PROT 6.8 03/21/2019   ALBUMIN 3.7 03/15/2018   CALCIUM 9.2 03/21/2019   GFRAA 63 03/21/2019   QFTBGOLDPLUS NEGATIVE 03/21/2019    Speciality Comments: Prior therapy includes: Humira and Enbrel (inadequate response), MTX (GI upset and elevated creatinine), Arava (diarrhea). He is not a good candidate for Plaquenil due to diabetic retinopathy and CKD stage 3.  Procedures:  No procedures performed Allergies: Morphine and related   Assessment / Plan:     Visit Diagnoses: Rheumatoid arthritis involving multiple sites with positive rheumatoid factor (HCC): He has no synovitis on exam today.  He is currently holding Orencia due to being diagnosed with septic olecranon bursitis of the left elbow in September.  He has completed 3 rounds of oral antibiotics and has no signs of infection currently.  He was not hospitalized and did not receive IV antibiotics.  He has been under the care of Dr. Magnus Ivan who has been monitoring him closely.  He was  advised he needs clearance prior to restarting on Orencia.  He is not having any other joint pain or joint swelling.  He will follow up in 1 month.   Rheumatoid nodulosis (HCC)   High risk medication use - Holding Orencia 125 mg sq weekly injections (started in September 2020). Inadequate response to Enbrel, Humira, D/c MTX and Arava due to SE, PLQ is not a good option due to diabetic retinopathy.   Septic olecranon bursitis of left elbow: He has had  recurrent olecranon bursitis and had an aspiration on 04/13/2019.  There was no signs of infection at that time.  According to the patient the fluid returned and became very tender.  [facility] and according to the patient he had 15 mL of purulent fluid drawn off at that visit.  He was started on doxycycline.  According to the records he was evaluated in the ED on 05/02/19 for the same concern.  He followed up with Dr. Magnus IvanBlackman on 05/02/2019 and had an I&D of an abscess of the olecranon bursa.  He has taken 3 rounds of antibiotics and the patient reports the infection has completely cleared.  He has not restarted on Orencia due to being apprehensive since he is an insulin dependent diabetic.  He was advised that he needs to be cleared by Dr. Magnus IvanBlackman prior to restarting on Orencia.  Primary osteoarthritis of both knees: He has good ROM with no discomfort.  No warmth or effusion of knee joints.    Other medical conditions are listed as follows:   History of coronary artery disease  History of hypertension  History of diabetes mellitus  History of chronic kidney disease  Dyslipidemia  Tobacco abuse  Orders: No orders of the defined types were placed in this encounter.  No orders of the defined types were placed in this encounter.   Face-to-face time spent with patient was 30 minutes. Greater than 50% of time was spent in counseling and coordination of care.  Follow-Up Instructions: Return in about 4 weeks  (around 06/29/2019) for Rheumatoid arthritis, Osteoarthritis.   [name] Bienenstockaylor M Dale, PA-C   I examined and evaluated the patient with [name] Alesaylor Dale PA.  Patient is holding off Orencia currently.  He had no tenderness on palpation of the olecranon bursa no bursitis was noted.  He still had some erythema.  I have advised him to get clearance from Dr. Magnus IvanBlackman prior to resuming Orencia.  He has appointment coming up with Dr. Magnus IvanBlackman.  He is also dealing with left foot fracture.  I will schedule DEXA scan.  The plan of care was discussed as noted above.  [name] SavoyShaili Deveshwar, MD  Note - This record has been created using Animal nutrition[employer]Dragon software.  Chart creation errors have been sought, but may not always  have been located. Such creation errors do not reflect on  the standard of medical care.

## 2019-05-19 ENCOUNTER — Encounter: Payer: Self-pay | Admitting: Cardiology

## 2019-05-19 ENCOUNTER — Other Ambulatory Visit: Payer: Self-pay

## 2019-05-19 ENCOUNTER — Ambulatory Visit (INDEPENDENT_AMBULATORY_CARE_PROVIDER_SITE_OTHER): Payer: 59 | Admitting: Cardiology

## 2019-05-19 VITALS — BP 136/71 | HR 62 | Temp 97.3°F | Ht 68.0 in | Wt 192.0 lb

## 2019-05-19 DIAGNOSIS — E7800 Pure hypercholesterolemia, unspecified: Secondary | ICD-10-CM

## 2019-05-19 DIAGNOSIS — I2510 Atherosclerotic heart disease of native coronary artery without angina pectoris: Secondary | ICD-10-CM

## 2019-05-19 DIAGNOSIS — Z951 Presence of aortocoronary bypass graft: Secondary | ICD-10-CM

## 2019-05-19 DIAGNOSIS — Z720 Tobacco use: Secondary | ICD-10-CM | POA: Diagnosis not present

## 2019-05-19 MED ORDER — NITROGLYCERIN 0.4 MG SL SUBL: 0.4000 mg | SUBLINGUAL_TABLET | SUBLINGUAL | 3 refills | Status: DC | PRN

## 2019-05-19 NOTE — Progress Notes (Signed)
Primary Physician:  [name], MD   Patient ID: [name], male    DOB: Dec 19, 1965, 53 y.o.   MRN: [id number]  Subjective:    Chief Complaint  Patient presents with  . Hypertension  . Coronary Artery Disease  . Hyperlipidemia  . Follow-up    3 month    HPI: [name]  is a 53 y.o. male  with history of Type I Diabetes, rheumatoid arthritis, hyperlipidemia, and CAD. Due to severe triple-vessel coronary artery disease he underwent coronary artery bypass grafting on 04/27/2014.   Patient was last seen 3 months ago virtually, metoprolol was changed to [facility] for complaints of erectile dysfunction.  He is tolerating Bystolic well, no complaints today.  Under a lot of stress with his job at this time as he is a Emergency planning/management officer.  He had recently started back exercising and was up to walking 4 miles per week, but has recently sustained left foot fracture and also developed left elbow bursitis which later led to infection.  He has undergone evaluation by Ortho and recently completed antibiotic therapy.  He has had occasional twinges of chest pain on the right side of his chest, not associated with exertion.  No nitroglycerin use. Blood pressure has been stable. Reports diabetes is well controlled with last HgbA1c at 7.0%  He was previously able to significantly cut back on tobacco use with the use of Wellbutrin; however, he reports stressful events with his ex-wife that precipitated worsening anxiety and he unfortunately has increased his tobacco use since.  He is hoping to begin work on this.  He has been making lifestyle changes with diet.   Past Medical History:  Diagnosis Date  . Anginal pain (HCC)   . Arthritis    RA IN HANDS  . Asthma   . CAD (coronary artery disease) 04/27/2014  . Chronic renal disease, stage 3, moderately decreased glomerular filtration rate between 30-59 mL/min/1.73 square meter 05/03/2014  . Coronary artery disease   . Diabetes (HCC) 05/03/2014   . Diabetes mellitus without complication (HCC)    type 1  . Hyperlipidemia   . Left shoulder pain   . 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/03/2014  . Shortness of breath   . Sleep apnea    mild OSA, no CPAP  . Unstable angina pectoris (HCC) 04/25/2014    Past Surgical History:  Procedure Laterality Date  . CARDIAC CATHETERIZATION  04/25/2014   BY DR Jacinto Halim  . CARDIAC CATHETERIZATION N/A 10/30/2015   Procedure: Left Heart Cath and Cors/Grafts Angiography;  Surgeon: Yates Decamp, MD;  Location: [facility] INVASIVE CV LAB;  Service: Cardiovascular;  Laterality: N/A;  . CARPAL TUNNEL RELEASE    . CORONARY ARTERY BYPASS GRAFT N/A 04/27/2014   Procedure: CORONARY ARTERY BYPASS GRAFTING on pump using left internal mammary artery to LAD coronary artery, right great saphenous vein graft to diagonal coronary artery with sequential to OM1 and circumflex coronary arteries. Right greater saphenous vein graft to posterior descending coronary artery. ;  Surgeon: Delight Ovens, MD;  Location: [facility] OR;  Service: Open Heart Surgery;  Laterality: N  . ENDOVEIN HARVEST OF GREATER SAPHENOUS VEIN Right 04/27/2014   Procedure: ENDOVEIN HARVEST OF GREATER SAPHENOUS VEIN;  Surgeon: Delight Ovens, MD;  Location: [facility] OR;  Service: Open Heart Surgery;  Laterality: Right;  . EXCISION ORAL TUMOR N/A 03/01/2018   Procedure: EXCISION ORAL TUMOR;  Surgeon: Christia Reading, MD;  Location: [facility];  Service: ENT;  Laterality: N/A;  . EYE SURGERY     LASER  . INTRAOPERATIVE TRANSESOPHAGEAL ECHOCARDIOGRAM N/A 04/27/2014   Procedure: INTRAOPERATIVE TRANSESOPHAGEAL ECHOCARDIOGRAM;  Surgeon: Grace Isaac, MD;  Location: [facility];  Service: Open Heart Surgery;  Laterality: N/A;  . LEFT HEART CATHETERIZATION WITH CORONARY ANGIOGRAM N/A 04/25/2014   Procedure: LEFT HEART CATHETERIZATION WITH CORONARY ANGIOGRAM;  Surgeon: Laverda Page, MD;  Location: [facility] CATH LAB;  Service: Cardiovascular;  Laterality:  N/A;  . MOUTH SURGERY  11/15/2017   tongue surgery     Social History   Socioeconomic History  . Marital status: Divorced    Spouse name: Not on file  . Number of children: 3  . Years of education: Not on file  . Highest education level: Not on file  Occupational History  . Not on file  Social Needs  . Financial resource strain: Not on file  . Food insecurity    Worry: Not on file    Inability: Not on file  . Transportation needs    Medical: Not on file    Non-medical: Not on file  Tobacco Use  . Smoking status: Current Every Day Smoker    Packs/day: 0.50    Years: 34.00    Pack years: 17.00    Types: Cigarettes  . Smokeless tobacco: Never Used  . Tobacco comment: 10 per day   Substance and Sexual Activity  . Alcohol use: Yes    Alcohol/week: 0.0 standard drinks    Comment: RARE  . Drug use: No  . Sexual activity: Not on file  Lifestyle  . Physical activity    Days per week: Not on file    Minutes per session: Not on file  . Stress: Not on file  Relationships  . Social Herbalist on phone: Not on file    Gets together: Not on file    Attends religious service: Not on file    Active member of club or organization: Not on file    Attends meetings of clubs or organizations: Not on file    Relationship status: Not on file  . Intimate partner violence    Fear of current or ex partner: Not on file    Emotionally abused: Not on file    Physically abused: Not on file    Forced sexual activity: Not on file  Other Topics Concern  . Not on file  Social History Narrative  . Not on file    Review of Systems  Constitution: Negative for decreased appetite, malaise/fatigue, weight gain and weight loss.  Eyes: Negative for visual disturbance.  Cardiovascular: Positive for dyspnea on exertion. Negative for chest pain, claudication, leg swelling, orthopnea, palpitations and syncope.  Respiratory: Negative for hemoptysis and wheezing.   Endocrine: Negative for  cold intolerance and heat intolerance.  Hematologic/Lymphatic: Does not bruise/bleed easily.  Skin: Negative for nail changes.  Musculoskeletal: Positive for joint pain. Negative for muscle weakness and myalgias.  Gastrointestinal: Positive for bloating and constipation. Negative for abdominal pain, change in bowel habit, nausea and vomiting.  Neurological: Positive for dizziness. Negative for difficulty with concentration, focal weakness and headaches.  Psychiatric/Behavioral: Negative for altered mental status and suicidal ideas.  All other systems reviewed and are negative.     Objective:  Blood pressure 136/71, pulse 62, temperature (!) 97.3 F (36.3 C), height 5\' 8"  (1.727 m), weight 192 lb (87.1 kg), SpO2 98 %. Body mass index is 29.19 kg/m.     Physical Exam  Constitutional: He is oriented to person, place, and time. Vital signs are normal. He appears well-developed and well-nourished.  HENT:  Head: Normocephalic and atraumatic.  Neck: Normal range of motion.  Cardiovascular: Normal rate, regular rhythm, normal heart sounds and intact distal pulses.  Pulses:      Femoral pulses are 2+ on the right side and 2+ on the left side.      Popliteal pulses are 2+ on the right side and 2+ on the left side.       Dorsalis pedis pulses are 1+ on the right side and 1+ on the left side.       Posterior tibial pulses are 1+ on the right side and 1+ on the left side.  Pulmonary/Chest: Effort normal and breath sounds normal. No accessory muscle usage. No respiratory distress.  Abdominal: Soft. Bowel sounds are normal.  Musculoskeletal: Normal range of motion.  Neurological: He is alert and oriented to person, place, and time.  Skin: Skin is warm and dry.  Vitals reviewed.  Radiology: No results found.  Laboratory examination:    CMP Latest Ref Rng & Units 03/21/2019 10/13/2018 07/05/2018  Glucose 65 - 99 mg/dL 829(F227(H) 621(H200(H) 086(V118(H)  BUN 7 - 25 mg/dL 12 12 12   Creatinine 0.70 - 1.33  mg/dL 7.84(O1.43(H) 9.62(X1.51(H) 5.28(U1.39(H)  Sodium 135 - 146 mmol/L 138 138 138  Potassium 3.5 - 5.3 mmol/L 4.7 5.2 4.8  Chloride 98 - 110 mmol/L 103 103 103  CO2 20 - 32 mmol/L 28 29 29   Calcium 8.6 - 10.3 mg/dL 9.2 9.2 9.6  Total Protein 6.1 - 8.1 g/dL 6.8 6.5 7.0  Total Bilirubin 0.2 - 1.2 mg/dL 0.4 0.4 0.4  Alkaline Phos 38 - 126 U/L - - -  AST 10 - 35 U/L 20 19 19   ALT 9 - 46 U/L 21 19 20    CBC Latest Ref Rng & Units 03/21/2019 10/13/2018 07/05/2018  WBC 3.8 - 10.8 Thousand/uL 11.3(H) 9.0 10.2  Hemoglobin 13.2 - 17.1 g/dL 13.214.0 44.014.4 10.214.1  Hematocrit 38.5 - 50.0 % 41.4 42.6 41.0  Platelets 140 - 400 Thousand/uL 231 234 238   Lipid Panel     Component Value Date/Time   CHOL 55 (L) 02/10/2019 0907   TRIG 43 02/10/2019 0907   HDL 29 (L) 02/10/2019 0907   CHOLHDL 1.9 02/10/2019 0907   LDLCALC 17 02/10/2019 0907   HEMOGLOBIN A1C Lab Results  Component Value Date   HGBA1C 7.4 (H) 04/25/2014   MPG 166 (H) 04/25/2014   TSH No results for input(s): TSH in the last 8760 hours.  PRN Meds:. Medications Discontinued During This Encounter  Medication Reason  . traMADol (ULTRAM) 50 MG tablet Error  . tadalafil (CIALIS) 20 MG tablet Error   Current Meds  Medication Sig  . Abatacept (ORENCIA CLICKJECT) 125 MG/ML SOAJ Inject 125 mg into the skin once a week.  . Abatacept (ORENCIA CLICKJECT) 125 MG/ML SOAJ Inject 125 mg into the skin once a week.  Marland Kitchen. Apoaequorin (PREVAGEN PO) Take by mouth.  . Ascorbic Acid (VITAMIN C) 1000 MG tablet Take 1,000 mg by mouth daily.  Marland Kitchen. aspirin 81 MG tablet Take 81 mg by mouth daily.  Marland Kitchen. buPROPion (WELLBUTRIN SR) 150 MG 12 hr tablet Take 1 tablet (150 mg total) by mouth daily.  . Cholecalciferol (VITAMIN D3) 1.25 MG (50000 UT) CAPS Take by mouth.  . clindamycin (CLEOCIN) 300 MG capsule Take 1 capsule (300 mg total) by mouth 3 (three) times daily.  . COD LIVER  OIL PO Take 650 mg by mouth daily.   . Cyanocobalamin (VITAMIN B-12 PO) Take by mouth daily.  Marland Kitchen doxycycline  (VIBRAMYCIN) 100 MG capsule Take 1 capsule (100 mg total) by mouth 2 (two) times daily.  . Insulin Glargine (BASAGLAR KWIKPEN [place]) Inject into the skin. 19 units am 19 units pm  . insulin lispro (HUMALOG) 100 UNIT/ML injection Inject 4-17 Units into the skin See admin instructions. Inject 3-4 times daily with meals per sliding scale instructions on glucose meter: 1 unit for every 10 grams of carbs and CBG calculations  . magnesium gluconate (MAGONATE) 500 MG tablet Take 500 mg by mouth 2 (two) times daily.  Marland Kitchen moxifloxacin (AVELOX) 400 MG tablet Take 1 tablet (400 mg total) by mouth daily at 8 pm.  . nebivolol (BYSTOLIC) 5 MG tablet Take 1 tablet (5 mg total) by mouth daily.  . nitroGLYCERIN (NITROSTAT) 0.4 MG SL tablet Place 1 tablet (0.4 mg total) under the tongue every 5 (five) minutes as needed for chest pain.  . Probiotic Product (PROBIOTIC PO) Take 1 tablet by mouth daily.  Marland Kitchen REPATHA SURECLICK 140 MG/ML SOAJ Inject 140 mg as directed every 14 (fourteen) days.  . Zinc 30 MG CAPS Take 1 capsule (30 mg total) by mouth daily.    Cardiac Studies:   Carotid artery duplex Aug 05, 2018: Stenosis in the bilateral internal carotid artery (16-49%). with heteregenous plaque. Antegrade vertebral artery flow. No significant change from 07/16/2017. Follow up in one year is appropriate if clinically indicated.  Coronary angiogram 10/30/2015: Normal LVEF, 55%. Mid LAD occluded after D1. LIMA to LAD, SVG to D1 patent. Circumflex occluded in the midsegment after OM1. SVG to OM2 patent, sequential branch to his to circumflex occluded. Distal circumflex filled by collaterals. Mid RCA 90%, distal RCA 90%. SVG to RCA patent.  Lexiscan sestamibi stress test 10/12/2015: 1. The resting electrocardiogram demonstrated normal sinus rhythm, normal resting conduction, no resting arrhythmias and T changes. Stress EKG is non-diagnostic for ischemia as it a pharmacologic stress using Lexiscan. Stress test converted to  Lexiscan due to exerise intolerence and dyspnea. 2. The perfusion imaging study demonstrates a medium size severe ischemia in the inferior and inferolateral wall extending from the base towards the apex. Left ventricular systolic function was normal with ejection fraction of 69%. This is a high risk study, consider further cardiac work-up.  CABG 04/27/2014: LIMA to LAD, SVG to D1, SVG to OM1 and distal circumflex, SVG to PDA. Dr. Ofilia Neas.  Echocardiogram 06/08/2014: - Left ventricle cavity is normal in size. Moderate concentric hypertrophy of the left ventricle. Normal global wall motion. Visual EF is 60-65%. Calculated EF 56%. - Trileaflet aortic valve with no regurgitation noted. Mild calcification of the aortic valve annulus. - Mild mitral regurgitation. Mild calcification of the mitral valve annulus. - Mild tricuspid regurgitation. No evidence of pulmonary hypertension.  Assessment:     ICD-10-CM   1. Coronary artery disease involving native coronary artery of native heart without angina pectoris  I25.10 EKG 12-Lead  2. S/P CABG x 5  Z95.1   3. Pure hypercholesterolemia  E78.00     EKG 05/19/2019: Normal sinus rhythm at 61 bpm, normal axis, diffuse nonspecific T wave abnormality. No changes compared to previous EKG in Jan 2020.   Recommendations:   Patient is here for 72-month office visit and follow-up for CAD.  He is presently doing well without any symptoms of angina.  He is recovering from left foot fracture and also bursitis in his left  elbow.  This has limited his activity recently, but is hoping to resume activity soon.  He is making diet changes he has had improvement in his hemoglobin A1c with this.  He has had occasional twinges of chest pain not associated with exertion.  Did not feel it was severe enough to use nitroglycerin.  I have refilled his nitroglycerin and advised him to use if needed.  No changes are noted to EKG or physical exam.  We will continue with watchful  waiting.  His lipids are very well controlled with Repatha, will continue the same.  Blood pressure is also stable.    He had previously significantly cut back on tobacco use, but has now increased his due to getting recent events.  He is hoping to begin working on this again.  I will see him back in 6 months or sooner if needed.  Toniann FailAshton Haynes Anola Mcgough, MSN, APRN, FNP-C [facility] Office: [phone] Fax: [phone]

## 2019-06-01 ENCOUNTER — Other Ambulatory Visit: Payer: Self-pay

## 2019-06-01 ENCOUNTER — Ambulatory Visit (INDEPENDENT_AMBULATORY_CARE_PROVIDER_SITE_OTHER): Payer: 59 | Admitting: Physician Assistant

## 2019-06-01 ENCOUNTER — Encounter: Payer: Self-pay | Admitting: Physician Assistant

## 2019-06-01 VITALS — BP 119/69 | HR 62 | Resp 14 | Ht 68.0 in | Wt 190.0 lb

## 2019-06-01 DIAGNOSIS — M0579 Rheumatoid arthritis with rheumatoid factor of multiple sites without organ or systems involvement: Secondary | ICD-10-CM | POA: Diagnosis not present

## 2019-06-01 DIAGNOSIS — M71122 Other infective bursitis, left elbow: Secondary | ICD-10-CM

## 2019-06-01 DIAGNOSIS — M170 Bilateral primary osteoarthritis of knee: Secondary | ICD-10-CM

## 2019-06-01 DIAGNOSIS — Z87448 Personal history of other diseases of urinary system: Secondary | ICD-10-CM

## 2019-06-01 DIAGNOSIS — Z720 Tobacco use: Secondary | ICD-10-CM

## 2019-06-01 DIAGNOSIS — M0630 Rheumatoid nodule, unspecified site: Secondary | ICD-10-CM | POA: Diagnosis not present

## 2019-06-01 DIAGNOSIS — Z8639 Personal history of other endocrine, nutritional and metabolic disease: Secondary | ICD-10-CM

## 2019-06-01 DIAGNOSIS — E785 Hyperlipidemia, unspecified: Secondary | ICD-10-CM

## 2019-06-01 DIAGNOSIS — Z79899 Other long term (current) drug therapy: Secondary | ICD-10-CM

## 2019-06-01 DIAGNOSIS — S92355D Nondisplaced fracture of fifth metatarsal bone, left foot, subsequent encounter for fracture with routine healing: Secondary | ICD-10-CM

## 2019-06-01 DIAGNOSIS — Z8679 Personal history of other diseases of the circulatory system: Secondary | ICD-10-CM

## 2019-06-06 ENCOUNTER — Ambulatory Visit (INDEPENDENT_AMBULATORY_CARE_PROVIDER_SITE_OTHER): Payer: 59

## 2019-06-06 ENCOUNTER — Encounter: Payer: Self-pay | Admitting: Orthopaedic Surgery

## 2019-06-06 ENCOUNTER — Other Ambulatory Visit: Payer: Self-pay

## 2019-06-06 ENCOUNTER — Ambulatory Visit (INDEPENDENT_AMBULATORY_CARE_PROVIDER_SITE_OTHER): Payer: 59 | Admitting: Orthopaedic Surgery

## 2019-06-06 DIAGNOSIS — M84375D Stress fracture, left foot, subsequent encounter for fracture with routine healing: Secondary | ICD-10-CM

## 2019-06-06 DIAGNOSIS — M71022 Abscess of bursa, left elbow: Secondary | ICD-10-CM

## 2019-06-06 NOTE — Progress Notes (Signed)
The patient is a 53 year old gentleman that I am following for 2 issues.  1 is his left elbow that did have an infected olecranon bursitis there we performed an I&D in the clinic.  That done much better overall.  There are still some swelling but he denies any drainage at all.  He denies any redness or pain.  He is in a cam walking boot.  He has a known fifth metatarsal fracture of his left foot.  He is not a diabetic.  On examination of his left elbow his range of motion is full.  There is no residual signs of infection.  There is some fullness of the soft tissue related to thickened tissue from the bursa sac itself but there is no drainage and no open wound.  There is no cellulitis and his range of motion is full.  Examination of his left foot does show pain over the base of the fifth metatarsal but otherwise we will normal-appearing foot.  He does have a weak pulse.  3 views of the left foot show a nondisplaced Jones fracture of the fifth metatarsal.  There are calcifications in the vessels suggesting chronic peripheral vascular disease.  We can see if he can transition to a postop shoe.  He can transition to even a regular shoe once he feels more comfortable but he knows to avoid high impact aerobic activities and walking long distances.  The leg see him back in about 6 weeks for repeat 3 views of his left foot.  All question concerns were answered and addressed.

## 2019-06-08 NOTE — Progress Notes (Signed)
Office Visit Note  Patient: [name]             Date of Birth: August 11, 1965           MRN: [id number]             PCP: Leanna Battles, MD Referring: Leanna Battles, MD Visit Date: 06/22/2019 Occupation: @GUAROCC @  Subjective:  Discuss DEXA    History of Present Illness: [name] is a 53 y.o. male with history of seropositive rheumatoid arthritis.  Patient is on Orencia and 25 mg subcutaneous injections every week.  He restarted 2 weeks ago after being cleared by Dr. [name].  He previously had an infected olecranon bursa of the left elbow and had an I&D and several rounds of antibiotics.  There is no active infection at this time.  He continues to wear a walking boot since he fractured his left fifth metatarsal.  He states he tried to transition to a postop shoe but was unable to do due to walking on concrete floors at work.  He states that he has some discomfort in the left knee joint but denies any joint swelling.  He has stiffness in both hands but denies any active inflammation.   Activities of Daily Living:  Patient reports morning stiffness for  15  minutes.   Patient Denies nocturnal pain.  Difficulty dressing/grooming: Denies Difficulty climbing stairs: Reports Difficulty getting out of chair: Denies Difficulty using hands for taps, buttons, cutlery, and/or writing: Denies  Review of Systems  Constitutional: Negative for fatigue and night sweats.  HENT: Negative for mouth sores, mouth dryness and nose dryness.   Eyes: Negative for redness and dryness.  Respiratory: Negative for cough, hemoptysis, shortness of breath and difficulty breathing.   Cardiovascular: Negative for chest pain, palpitations, hypertension, irregular heartbeat and swelling in legs/feet.  Gastrointestinal: Negative for blood in stool, constipation and diarrhea.  Endocrine: Negative for increased urination.  Genitourinary: Negative for painful urination.  Musculoskeletal: Positive for  arthralgias, joint pain and morning stiffness. Negative for joint swelling, myalgias, muscle weakness, muscle tenderness and myalgias.  Skin: Negative for color change, rash, hair loss, nodules/bumps, skin tightness, ulcers and sensitivity to sunlight.  Allergic/Immunologic: Negative for susceptible to infections.  Neurological: Negative for dizziness, fainting, memory loss, night sweats and weakness.  Hematological: Negative for swollen glands.  Psychiatric/Behavioral: Negative for depressed mood and sleep disturbance. The patient is not nervous/anxious.     PMFS History:  Patient Active Problem List   Diagnosis Date Noted  . Chronic venous insufficiency 10/05/2018  . Controlled type 1 diabetes mellitus with diabetic peripheral angiopathy without gangrene ([place]) 08/14/2017  . Dyslipidemia 03/26/2017  . High risk medication use 09/23/2016  . History of hypertension 09/23/2016  . History of chronic kidney disease 09/23/2016  . History of coronary artery disease 09/23/2016  . Tobacco abuse 09/23/2016  . Primary osteoarthritis of both knees 09/23/2016  . History of diabetes mellitus 09/23/2016  . 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 Chest pain with high risk for cardiac etiology 10/29/2015  . Asymptomatic bilateral carotid artery stenosis 06/08/2014  . Pleural effusion, bilateral 06/03/2014  . Dyspnea 06/01/2014  . Diabetes ([place]) 05/03/2014  . Rheumatoid arthritis involving multiple joints ([place]) 05/03/2014  . 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 Chronic renal disease, stage 3, moderately decreased glomerular filtration rate between 30-59 mL/min/1.73 square meter 05/03/2014  . CAD (coronary artery disease) 04/27/2014    Past Medical History:  Diagnosis Date  . Anginal pain ([place])   .  Arthritis    RA IN HANDS  . Asthma   . CAD (coronary artery disease) 04/27/2014  . Chronic renal disease, stage 3, moderately decreased glomerular filtration rate between 30-59 mL/min/1.73 square meter  05/03/2014  . Coronary artery disease   . Diabetes (HCC) 05/03/2014  . Diabetes mellitus without complication (HCC)    type 1  . Hyperlipidemia   . Left shoulder pain   . 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/03/2014  . Shortness of breath   . Sleep apnea    mild OSA, no CPAP  . Unstable angina pectoris (HCC) 04/25/2014    Family History  Problem Relation Age of Onset  . Prostate cancer Father   . Heart disease Father   . Stomach cancer Mother   . Heart disease Brother   . Asthma Daughter   . Healthy Daughter    Past Surgical History:  Procedure Laterality Date  . CARDIAC CATHETERIZATION  04/25/2014   BY DR Jacinto Halim  . CARDIAC CATHETERIZATION N/A 10/30/2015   Procedure: Left Heart Cath and Cors/Grafts Angiography;  Surgeon: Yates Decamp, MD;  Location: [facility] INVASIVE CV LAB;  Service: Cardiovascular;  Laterality: N/A;  . CARPAL TUNNEL RELEASE    . CORONARY ARTERY BYPASS GRAFT N/A 04/27/2014   Procedure: CORONARY ARTERY BYPASS GRAFTING on pump using left internal mammary artery to LAD coronary artery, right great saphenous vein graft to diagonal coronary artery with sequential to OM1 and circumflex coronary arteries. Right greater saphenous vein graft to posterior descending coronary artery. ;  Surgeon: Delight Ovens, MD;  Location: [facility] OR;  Service: Open Heart Surgery;  Laterality: N  . elbow drained Left 05/09/2019  . ENDOVEIN HARVEST OF GREATER SAPHENOUS VEIN Right 04/27/2014   Procedure: ENDOVEIN HARVEST OF GREATER SAPHENOUS VEIN;  Surgeon: Delight Ovens, MD;  Location: [facility] OR;  Service: Open Heart Surgery;  Laterality: Right;  . EXCISION ORAL TUMOR N/A 03/01/2018   Procedure: EXCISION ORAL TUMOR;  Surgeon: Christia Reading, MD;  Location: [facility];  Service: ENT;  Laterality: N/A;  . EYE SURGERY     LASER  . INTRAOPERATIVE TRANSESOPHAGEAL ECHOCARDIOGRAM N/A 04/27/2014   Procedure: INTRAOPERATIVE TRANSESOPHAGEAL ECHOCARDIOGRAM;  Surgeon: Delight Ovens,  MD;  Location: [facility] OR;  Service: Open Heart Surgery;  Laterality: N/A;  . LEFT HEART CATHETERIZATION WITH CORONARY ANGIOGRAM N/A 04/25/2014   Procedure: LEFT HEART CATHETERIZATION WITH CORONARY ANGIOGRAM;  Surgeon: Pamella Pert, MD;  Location: [facility] CATH LAB;  Service: Cardiovascular;  Laterality: N/A;  . MOUTH SURGERY  11/15/2017   tongue surgery    Social History   Social History Narrative  . Not on file    There is no immunization history on file for this patient.   Objective: Vital Signs: BP 117/65 (BP Location: Left Arm, Patient Position: Sitting, Cuff Size: Normal)   Pulse 60   Resp 14   Ht 5\' 8"  (1.727 m)   Wt 190 lb (86.2 kg)   BMI 28.89 kg/m    Physical Exam Vitals signs and nursing note reviewed.  Constitutional:      Appearance: He is well-developed.  HENT:     Head: Normocephalic and atraumatic.  Eyes:     Conjunctiva/sclera: Conjunctivae normal.     Pupils: Pupils are equal, round, and reactive to light.  Neck:     Musculoskeletal: Normal range of motion and neck supple.  Cardiovascular:     Rate and Rhythm: Normal rate and regular rhythm.     Heart sounds: Normal  heart sounds.  Pulmonary:     Effort: Pulmonary effort is normal.     Breath sounds: Normal breath sounds.  Abdominal:     General: Bowel sounds are normal.     Palpations: Abdomen is soft.  Skin:    General: Skin is warm and dry.     Capillary Refill: Capillary refill takes less than 2 seconds.  Neurological:     Mental Status: He is alert and oriented to person, place, and time.  Psychiatric:        Behavior: Behavior normal.      Musculoskeletal Exam: C-spine good ROM.  Postural thoracic kyphosis.  Shoulder joints good ROM.  Elbow joints good ROM.  Scar tissue present in left olecranon bursa.  Wrist joints, MCPs, PIPs, and DIPs good ROM with no synovitis.  Synovial thickening of PIP and DIP joints.  Hip joints, knee joints, ankle joints, MTPs, PIPs, and DIPs good ROM with no synovitis.   No warmth or effusion of knee joints.  Cam walking boot on left lower extremity.   CDAI Exam: CDAI Score: - Patient Global: -; Provider Global: - Swollen: -; Tender: - Joint Exam   No joint exam has been documented for this visit   There is currently no information documented on the homunculus. Go to the Rheumatology activity and complete the homunculus joint exam.  Investigation: No additional findings.  Imaging: Xr Foot Complete Left  Result Date: 06/06/2019 3 views of the left foot show a nondisplaced fifth metatarsal fracture which is a Jones fracture.  There are calcifications in the vessels around the foot from chronic vascular disease.   Recent Labs: Lab Results  Component Value Date   WBC 11.3 (H) 03/21/2019   HGB 14.0 03/21/2019   PLT 231 03/21/2019   NA 138 03/21/2019   K 4.7 03/21/2019   CL 103 03/21/2019   CO2 28 03/21/2019   GLUCOSE 227 (H) 03/21/2019   BUN 12 03/21/2019   CREATININE 1.43 (H) 03/21/2019   BILITOT 0.4 03/21/2019   ALKPHOS 97 03/15/2018   AST 20 03/21/2019   ALT 21 03/21/2019   PROT 6.8 03/21/2019   ALBUMIN 3.7 03/15/2018   CALCIUM 9.2 03/21/2019   GFRAA 63 03/21/2019   QFTBGOLDPLUS NEGATIVE 03/21/2019    Speciality Comments: Prior therapy includes: Humira and Enbrel (inadequate response), MTX (GI upset and elevated creatinine), Arava (diarrhea). He is not a good candidate for Plaquenil due to diabetic retinopathy and CKD stage 3.  Procedures:  No procedures performed Allergies: Morphine and related   Assessment / Plan:     Visit Diagnoses: Rheumatoid arthritis involving multiple sites with positive rheumatoid factor (HCC): He has no synovitis on exam.  He restarted on Orencia 125 mg subcutaneous injections 2 weeks ago.  He has noticed improvement in his morning stiffness since restarting on Orencia.  He has no active inflammation at this time.  He has been having some discomfort in the left knee joint but no warmth or effusion was  noted.  He has stiffness in both hands but now synovitis was noted.  He will continue on Orencia as prescribed.  He was advised to notify us if he develops increased joint pain or joint swelling.  He is aware that he is to hold Orencia if he develops any signs or symptoms of an infection and to resume once the infection has completely cleared.  He will follow-up in the office in 3 months.  Rheumatoid nodulosis (HCC): Resolved.  High risk medication use -  Orencia 125 mg sq weekly injections (started in September 2020). Inadequate response to Enbrel, Humira, D/c MTX, Arava-SE, PLQ-retinopathy.  CBC and CMP will be drawn today to monitor for drug toxicity.  He will return in February and every 3 months for lab work.- Plan: CMP, CBC  Septic olecranon bursitis of left elbow: Resolved.  There was no signs of active infection.  No drainage or signs of cellulitis noted.  He has full range of motion of the left elbow joint on exam with no tenderness.  He has a thickened bursa sac but no inflammation noted.  He continues to follow-up with Dr. Magnus Ivan on a regular basis.  Primary osteoarthritis of both knees: He has good range of motion bilateral knee joints with no discomfort.  No warmth or effusion was noted.  He has been having intermittent discomfort in the left knee joint but no inflammation was noted.  Closed nondisplaced fracture of fifth metatarsal bone of left foot with routine healing, subsequent encounter: He continues to use a Cam walking boot.  He tried to transition to a postop shoe but could not tolerate it due to walking on concrete at work.  He has persistent tenderness of the fifth metatarsal.  He has evaluated by Dr. Magnus Ivan on 06/06/2019 and had updated x-rays that revealed a nondisplaced Jones fracture of the fifth metatarsal.  We ordered a bone density which revealed a T score of -1.9.  He was encouraged to start taking calcium 1200 mg daily and vitamin D 1000 units daily.  We will check a  vitamin D level today as well.- Plan: VITAMIN D 25  Osteopenia of multiple sites -DEXA 06/09/2019 T score -1.9 right femoral neck.  Calcium and vitamin D was recommended.  Dosing instructions were reviewed today.  We will check a vitamin D level today.  He recently had a stress fracture of the left fifth metatarsal.  He may require treatment due to his history of a stress fracture, osteopenia, and being a diabetic.  Plan: VITAMIN D 25  Other medical conditions are listed as follows:  History of coronary artery disease  History of hypertension  History of diabetes mellitus  History of chronic kidney disease  Dyslipidemia  Tobacco abuse    Orders: Orders Placed This Encounter  Procedures  . CMP  . CBC  . VITAMIN D 25   No orders of the defined types were placed in this encounter.   Face-to-face time spent with patient was 30 minutes. Greater than 50% of time was spent in counseling and coordination of care.  Follow-Up Instructions: Return in about 3 months (around 09/22/2019) for Rheumatoid arthritis.   Gearldine Bienenstock, PA-C  Note - This record has been created using Dragon software.  Chart creation errors have been sought, but may not always  have been located. Such creation errors do not reflect on  the standard of medical care.

## 2019-06-09 ENCOUNTER — Encounter: Payer: Self-pay | Admitting: Physician Assistant

## 2019-06-13 ENCOUNTER — Ambulatory Visit (INDEPENDENT_AMBULATORY_CARE_PROVIDER_SITE_OTHER): Payer: 59 | Admitting: Cardiology

## 2019-06-13 ENCOUNTER — Encounter: Payer: Self-pay | Admitting: Cardiology

## 2019-06-13 ENCOUNTER — Telehealth: Payer: Self-pay

## 2019-06-13 ENCOUNTER — Other Ambulatory Visit: Payer: Self-pay

## 2019-06-13 VITALS — BP 140/58 | HR 66 | Temp 98.0°F | Ht 68.0 in | Wt 190.0 lb

## 2019-06-13 DIAGNOSIS — I2510 Atherosclerotic heart disease of native coronary artery without angina pectoris: Secondary | ICD-10-CM

## 2019-06-13 DIAGNOSIS — R0789 Other chest pain: Secondary | ICD-10-CM | POA: Diagnosis not present

## 2019-06-13 DIAGNOSIS — Z951 Presence of aortocoronary bypass graft: Secondary | ICD-10-CM | POA: Diagnosis not present

## 2019-06-13 DIAGNOSIS — I10 Essential (primary) hypertension: Secondary | ICD-10-CM | POA: Diagnosis not present

## 2019-06-13 MED ORDER — AMLODIPINE BESYLATE 5 MG PO TABS: 5.0000 mg | ORAL_TABLET | Freq: Every day | ORAL | 2 refills | Status: DC

## 2019-06-13 NOTE — Progress Notes (Signed)
Primary Physician:  Jarome MatinPaterson, Daniel, MD   Patient ID: [name], male    DOB: 1966/03/25, 53 y.o.   MRN: [id number]  Subjective:    Chief Complaint  Patient presents with   Coronary Artery Disease   Chest Pain    right side     HPI: [name] Peeratrick J Ignatowski  is a 53 y.o. male  with history of Type I Diabetes, rheumatoid arthritis, hyperlipidemia, and CAD. Due to severe triple-vessel coronary artery disease he underwent coronary artery bypass grafting on 04/27/2014.   He has recently sustained left foot fracture and also developed left elbow bursitis which later led to infection.  He has undergone evaluation by Ortho and recently completed antibiotic therapy. Bursitis has now resolved. He is still wearing a boot for left foot fracture.  He is here for acute visit today for chest pain.  1 week ago he started feeling some right-sided chest pain while at work.  He has continued to have chest pain on and off.  He can pinpoint exactly to the pain and states he is unable to lay on his right eye due to exacerbating pain.  He has taken 1 or 2 nitroglycerin over the last 1 week but did ease off his chest discomfort after several minutes.  No worsening shortness of breath, hemoptysis, or leg swelling.  He is still on light duty due to his left foot fracture.  He was previously able to significantly cut back on tobacco use with the use of Wellbutrin; however, he reports stressful events with his ex-wife that precipitated worsening anxiety and he unfortunately has increased his tobacco use since.  He is hoping to begin work on this.  He has been making lifestyle changes with diet.   Past Medical History:  Diagnosis Date   Anginal pain (HCC)    Arthritis    RA IN HANDS   Asthma    CAD (coronary artery disease) 04/27/2014   Chronic renal disease, stage 3, moderately decreased glomerular filtration rate between 30-59 mL/min/1.73 square meter 05/03/2014   Coronary artery disease    Diabetes  (HCC) 05/03/2014   Diabetes mellitus without complication (HCC)    type 1   Hyperlipidemia    Left shoulder pain    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/03/2014   Shortness of breath    Sleep apnea    mild OSA, no CPAP   Unstable angina pectoris (HCC) 04/25/2014    Past Surgical History:  Procedure Laterality Date   CARDIAC CATHETERIZATION  04/25/2014   BY DR Jacinto HalimGANJI   CARDIAC CATHETERIZATION N/A 10/30/2015   Procedure: Left Heart Cath and Cors/Grafts Angiography;  Surgeon: Yates DecampJay Ganji, MD;  [facility];  Service: Cardiovascular;  Laterality: N/A;   CARPAL TUNNEL RELEASE     CORONARY ARTERY BYPASS GRAFT N/A 04/27/2014   Procedure: CORONARY ARTERY BYPASS GRAFTING on pump using left internal mammary artery to LAD coronary artery, right great saphenous vein graft to diagonal coronary artery with sequential to OM1 and circumflex coronary arteries. Right greater saphenous vein graft to posterior descending coronary artery. ;  Surgeon: Delight OvensEdward B Gerhardt, MD;  [facility];  Service: Open Heart Surgery;  Laterality: N   elbow drained Left 05/09/2019   ENDOVEIN HARVEST OF GREATER SAPHENOUS VEIN Right 04/27/2014   Procedure: ENDOVEIN HARVEST OF GREATER SAPHENOUS VEIN;  Surgeon: Delight OvensEdward B Gerhardt, MD;  Location: [facility] OR;  Service: Open Heart Surgery;  Laterality: Right;   EXCISION ORAL TUMOR N/A 03/01/2018  Procedure: EXCISION ORAL TUMOR;  Surgeon: Christia Reading, MD;  Location: [facility];  Service: ENT;  Laterality: N/A;   EYE SURGERY     LASER   INTRAOPERATIVE TRANSESOPHAGEAL ECHOCARDIOGRAM N/A 04/27/2014   Procedure: INTRAOPERATIVE TRANSESOPHAGEAL ECHOCARDIOGRAM;  Surgeon: Delight Ovens, MD;  Location: [facility] OR;  Service: Open Heart Surgery;  Laterality: N/A;   LEFT HEART CATHETERIZATION WITH CORONARY ANGIOGRAM N/A 04/25/2014   Procedure: LEFT HEART CATHETERIZATION WITH CORONARY ANGIOGRAM;  Surgeon: Pamella Pert, MD;  Location: [facility]  CATH LAB;  Service: Cardiovascular;  Laterality: N/A;   MOUTH SURGERY  11/15/2017   tongue surgery     Social History   Socioeconomic History   Marital status: Divorced    Spouse name: Not on file   Number of children: 3   Years of education: Not on file   Highest education level: Not on file  Occupational History   Not on file  Social Needs   Financial resource strain: Not on file   Food insecurity    Worry: Not on file    Inability: Not on file   Transportation needs    Medical: Not on file    Non-medical: Not on file  Tobacco Use   Smoking status: Current Every Day Smoker    Packs/day: 0.50    Years: 34.00    Pack years: 17.00    Types: Cigarettes   Smokeless tobacco: Never Used   Tobacco comment: 10 per day   Substance and Sexual Activity   Alcohol use: Yes    Alcohol/week: 0.0 standard drinks    Comment: RARE   Drug use: No   Sexual activity: Not on file  Lifestyle   Physical activity    Days per week: Not on file    Minutes per session: Not on file   Stress: Not on file  Relationships   Social connections    Talks on phone: Not on file    Gets together: Not on file    Attends religious service: Not on file    Active member of club or organization: Not on file    Attends meetings of clubs or organizations: Not on file    Relationship status: Not on file   Intimate partner violence    Fear of current or ex partner: Not on file    Emotionally abused: Not on file    Physically abused: Not on file    Forced sexual activity: Not on file  Other Topics Concern   Not on file  Social History Narrative   Not on file    Review of Systems  Constitution: Negative for decreased appetite, malaise/fatigue, weight gain and weight loss.  Eyes: Negative for visual disturbance.  Cardiovascular: Positive for chest pain and dyspnea on exertion. Negative for claudication, leg swelling, orthopnea, palpitations and syncope.  Respiratory: Negative for  hemoptysis and wheezing.   Endocrine: Negative for cold intolerance and heat intolerance.  Hematologic/Lymphatic: Does not bruise/bleed easily.  Skin: Negative for nail changes.  Musculoskeletal: Positive for joint pain. Negative for muscle weakness and myalgias.  Gastrointestinal: Positive for bloating and constipation. Negative for abdominal pain, change in bowel habit, nausea and vomiting.  Neurological: Positive for dizziness. Negative for difficulty with concentration, focal weakness and headaches.  Psychiatric/Behavioral: Negative for altered mental status and suicidal ideas.  All other systems reviewed and are negative.     Objective:  Blood pressure (!) 140/58, pulse 66, temperature 98 F (36.7 C), height 5\' 8"  (1.727 m), weight  190 lb (86.2 kg), SpO2 98 %. Body mass index is 28.89 kg/m.     Physical Exam  Constitutional: He is oriented to person, place, and time. Vital signs are normal. He appears well-developed and well-nourished.  HENT:  Head: Normocephalic and atraumatic.  Neck: Normal range of motion.  Cardiovascular: Normal rate, regular rhythm, normal heart sounds and intact distal pulses.  Pulses:      Femoral pulses are 2+ on the right side and 2+ on the left side.      Popliteal pulses are 2+ on the right side and 2+ on the left side.       Dorsalis pedis pulses are 1+ on the right side and 1+ on the left side.       Posterior tibial pulses are 1+ on the right side and 1+ on the left side.  Pulmonary/Chest: Effort normal and breath sounds normal. No accessory muscle usage. No respiratory distress. He exhibits tenderness.    Abdominal: Soft. Bowel sounds are normal.  Musculoskeletal: Normal range of motion.  Neurological: He is alert and oriented to person, place, and time.  Skin: Skin is warm and dry.  Vitals reviewed.  Radiology: No results found.  Laboratory examination:    CMP Latest Ref Rng & Units 03/21/2019 10/13/2018 07/05/2018  Glucose 65 - 99 mg/dL  703(J) 009(F) 818(E)  BUN 7 - 25 mg/dL 12 12 12   Creatinine 0.70 - 1.33 mg/dL ) 9.93(Z) 1.69(C)  Sodium 135 - 146 mmol/L 138 138 138  Potassium 3.5 - 5.3 mmol/L 4.7 5.2 4.8  Chloride 98 - 110 mmol/L 103 103 103  CO2 20 - 32 mmol/L 28 29 29   Calcium 8.6 - 10.3 mg/dL 9.2 9.2 9.6  Total Protein 6.1 - 8.1 g/dL 6.8 6.5 7.0  Total Bilirubin 0.2 - 1.2 mg/dL 0.4 0.4 0.4  Alkaline Phos 38 - 126 U/L - - -  AST 10 - 35 U/L 20 19 19   ALT 9 - 46 U/L 21 19 20    CBC Latest Ref Rng & Units 03/21/2019 10/13/2018 07/05/2018  WBC 3.8 - 10.8 Thousand/uL 11.3(H) 9.0 10.2  Hemoglobin 13.2 - 17.1 g/dL 03/23/2019 12/13/2018  Hematocrit 38.5 - 50.0 % 41.4 42.6 41.0  Platelets 140 - 400 Thousand/uL 231 234 238   Lipid Panel     Component Value Date/Time   CHOL 55 (L) 02/10/2019 0907   TRIG 43 02/10/2019 0907   HDL 29 (L) 02/10/2019 0907   CHOLHDL 1.9 02/10/2019 0907   LDLCALC 17 02/10/2019 0907   HEMOGLOBIN A1C Lab Results  Component Value Date   HGBA1C 7.4 (H) 04/25/2014   MPG 166 (H) 04/25/2014   TSH No results for input(s): TSH in the last 8760 hours.  PRN Meds:. There are no discontinued medications. Current Meds  Medication Sig   Abatacept (ORENCIA CLICKJECT) 125 MG/ML SOAJ Inject 125 mg into the skin once a week.   Apoaequorin (PREVAGEN PO) Take by mouth.   Ascorbic Acid (VITAMIN C) 1000 MG tablet Take 1,000 mg by mouth daily.   aspirin 81 MG tablet Take 81 mg by mouth daily.   buPROPion (WELLBUTRIN SR) 150 MG 12 hr tablet Take 1 tablet (150 mg total) by mouth daily.   Cholecalciferol (VITAMIN D3) 1.25 MG (50000 UT) CAPS Take by mouth.   COD LIVER OIL PO Take 650 mg by mouth daily.    Cyanocobalamin (VITAMIN B-12 PO) Take by mouth daily.   insulin glargine (LANTUS) 100 UNIT/ML injection Inject into the skin daily.  19 units BID   insulin lispro (HUMALOG) 100 UNIT/ML injection Inject 4-17 Units into the skin See admin instructions. Inject 3-4 times daily with meals per sliding  scale instructions on glucose meter: 1 unit for every 10 grams of carbs and CBG calculations   magnesium gluconate (MAGONATE) 500 MG tablet Take 500 mg by mouth daily.    nebivolol (BYSTOLIC) 5 MG tablet Take 1 tablet (5 mg total) by mouth daily.   nitroGLYCERIN (NITROSTAT) 0.4 MG SL tablet Place 1 tablet (0.4 mg total) under the tongue every 5 (five) minutes as needed for chest pain.   Probiotic Product (PROBIOTIC PO) Take 1 tablet by mouth daily.   REPATHA SURECLICK 140 MG/ML SOAJ Inject 140 mg as directed every 14 (fourteen) days.   Zinc 30 MG CAPS Take 1 capsule (30 mg total) by mouth daily.    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 with heteregenous plaque. Antegrade vertebral artery flow. No significant change from 07/16/2017. Follow up in one year is appropriate if clinically indicated.  Coronary angiogram 10/30/2015: Normal LVEF, 55%. Mid LAD occluded after D1. LIMA to LAD, SVG to D1 patent. Circumflex occluded in the midsegment after OM1. SVG to OM2 patent, sequential branch to his to circumflex occluded. Distal circumflex filled by collaterals. Mid RCA 90%, distal RCA 90%. SVG to RCA patent.  Lexiscan sestamibi stress test 10/12/2015: 1. The resting electrocardiogram demonstrated normal sinus rhythm, normal resting conduction, no resting arrhythmias and T changes. Stress EKG is non-diagnostic for ischemia as it a pharmacologic stress using Lexiscan. Stress test converted to Lexiscan due to exerise intolerence and dyspnea. 2. The perfusion imaging study demonstrates a medium size severe ischemia in the inferior and inferolateral wall extending from the base towards the apex. Left ventricular systolic function was normal with ejection fraction of 69%. This is a high risk study, consider further cardiac work-up.  CABG 04/27/2014: LIMA to LAD, SVG to D1, SVG to OM1 and distal circumflex, SVG to PDA. Dr. Ofilia NeasEd  Gerhardt.  Echocardiogram 06/08/2014: - Left ventricle cavity is normal in size. Moderate concentric hypertrophy of the left ventricle. Normal global wall motion. Visual EF is 60-65%. Calculated EF 56%. - Trileaflet aortic valve with no regurgitation noted. Mild calcification of the aortic valve annulus. - Mild mitral regurgitation. Mild calcification of the mitral valve annulus. - Mild tricuspid regurgitation. No evidence of pulmonary hypertension.  Assessment:     ICD-10-CM   1. Coronary artery disease involving native coronary artery of native heart without angina pectoris  I25.10 EKG 12-Lead  2. Musculoskeletal chest pain  R07.89   3. S/P CABG x 5  Z95.1   4. Primary hypertension  I10     EKG 06/15/2019: Normal sinus rhythm at 61 bpm, normal axis, diffuse nonspecific T wave abnormality. No changes compared to previous EKG on 05/19/2019.   Recommendations:   Patient is here for acute visit for chest pain that I feel is likely musculoskeletal etiology given chest wall tenderness with palpation on exam and also exacerbation with certain positions.  Although he did have some relief after several minutes of nitroglycerin use, it does not appear that he had immediate relief. No changes noted to EKG. Encouraged him to use heat and ice to his chest wall for the next 1 to 2 weeks and also to use ibuprofen for the next few days.  His blood pressure is slightly elevated today, will add amlodipine. I will plan to see him  back in 2-3 weeks for close follow up.   Miquel Dunn, MSN, APRN, FNP-C [facility]. [place] Office: [phone] Fax: [phone]

## 2019-06-13 NOTE — Telephone Encounter (Signed)
Pt called to inform us that he has been having RT side chest pain. Pt is not able to lay down on that side, his LT side hurt as well. Pt has not been able to sleep. He has been taking Nito and it has helped slightly. Please advise thank you

## 2019-06-13 NOTE — Telephone Encounter (Signed)
Ok pt will make an appt for today

## 2019-06-16 ENCOUNTER — Telehealth: Payer: Self-pay | Admitting: *Deleted

## 2019-06-16 NOTE — Telephone Encounter (Signed)
Received DEXA results from [facility].  Date of Scan: 06/09/19 Lowest T-score and site measured:-1.9 Right Femoral Neck  Significant changes in BMD and site measured (5% and above):n/a  Current Regimen:n/a  Recommendation: Calcium and vitamin D daily.

## 2019-06-16 NOTE — Telephone Encounter (Signed)
Patient advised of DEXA scan results and advised to take Calcium with Vitamin D daily.

## 2019-06-22 ENCOUNTER — Ambulatory Visit: Payer: 59 | Admitting: Physician Assistant

## 2019-06-22 ENCOUNTER — Other Ambulatory Visit: Payer: Self-pay

## 2019-06-22 ENCOUNTER — Encounter: Payer: Self-pay | Admitting: Physician Assistant

## 2019-06-22 VITALS — BP 117/65 | HR 60 | Resp 14 | Ht 68.0 in | Wt 190.0 lb

## 2019-06-22 DIAGNOSIS — M0630 Rheumatoid nodule, unspecified site: Secondary | ICD-10-CM

## 2019-06-22 DIAGNOSIS — Z720 Tobacco use: Secondary | ICD-10-CM

## 2019-06-22 DIAGNOSIS — S92355D Nondisplaced fracture of fifth metatarsal bone, left foot, subsequent encounter for fracture with routine healing: Secondary | ICD-10-CM

## 2019-06-22 DIAGNOSIS — Z8639 Personal history of other endocrine, nutritional and metabolic disease: Secondary | ICD-10-CM

## 2019-06-22 DIAGNOSIS — Z8679 Personal history of other diseases of the circulatory system: Secondary | ICD-10-CM

## 2019-06-22 DIAGNOSIS — E785 Hyperlipidemia, unspecified: Secondary | ICD-10-CM

## 2019-06-22 DIAGNOSIS — M0579 Rheumatoid arthritis with rheumatoid factor of multiple sites without organ or systems involvement: Secondary | ICD-10-CM | POA: Diagnosis not present

## 2019-06-22 DIAGNOSIS — Z87448 Personal history of other diseases of urinary system: Secondary | ICD-10-CM

## 2019-06-22 DIAGNOSIS — M71122 Other infective bursitis, left elbow: Secondary | ICD-10-CM

## 2019-06-22 DIAGNOSIS — M8589 Other specified disorders of bone density and structure, multiple sites: Secondary | ICD-10-CM

## 2019-06-22 DIAGNOSIS — Z79899 Other long term (current) drug therapy: Secondary | ICD-10-CM

## 2019-06-22 DIAGNOSIS — M170 Bilateral primary osteoarthritis of knee: Secondary | ICD-10-CM

## 2019-06-22 NOTE — Patient Instructions (Addendum)
Start taking calcium 1200 mg by mouth daily. Vitamin D 1000 units daily.  We will call you with the results of the vitamin D test and determine if we need to increase the dose of vitamin D further.

## 2019-06-23 LAB — CBC WITH DIFFERENTIAL/PLATELET
Absolute Monocytes: 530 cells/uL (ref 200–950)
Basophils Absolute: 100 cells/uL (ref 0–200)
Basophils Relative: 1 %
Eosinophils Absolute: 560 cells/uL — ABNORMAL HIGH (ref 15–500)
Eosinophils Relative: 5.6 %
HCT: 40.7 % (ref 38.5–50.0)
Hemoglobin: 13.2 g/dL (ref 13.2–17.1)
Lymphs Abs: 3390 cells/uL (ref 850–3900)
MCH: 29.9 pg (ref 27.0–33.0)
MCHC: 32.4 g/dL (ref 32.0–36.0)
MCV: 92.3 fL (ref 80.0–100.0)
MPV: 10.8 fL (ref 7.5–12.5)
Monocytes Relative: 5.3 %
Neutro Abs: 5420 cells/uL (ref 1500–7800)
Neutrophils Relative %: 54.2 %
Platelets: 260 10*3/uL (ref 140–400)
RBC: 4.41 10*6/uL (ref 4.20–5.80)
RDW: 12.1 % (ref 11.0–15.0)
Total Lymphocyte: 33.9 %
WBC: 10 10*3/uL (ref 3.8–10.8)

## 2019-06-23 LAB — COMPLETE METABOLIC PANEL WITH GFR
AG Ratio: 1.4 (calc) (ref 1.0–2.5)
ALT: 20 U/L (ref 9–46)
AST: 19 U/L (ref 10–35)
Albumin: 3.9 g/dL (ref 3.6–5.1)
Alkaline phosphatase (APISO): 95 U/L (ref 35–144)
BUN/Creatinine Ratio: 7 (calc) (ref 6–22)
BUN: 10 mg/dL (ref 7–25)
CO2: 27 mmol/L (ref 20–32)
Calcium: 8.9 mg/dL (ref 8.6–10.3)
Chloride: 101 mmol/L (ref 98–110)
Creat: 1.39 mg/dL — ABNORMAL HIGH (ref 0.70–1.33)
GFR, Est African American: 66 mL/min/{1.73_m2} (ref >=60)
GFR, Est Non African American: 57 mL/min/{1.73_m2} — ABNORMAL LOW (ref >=60)
Globulin: 2.8 g/dL (calc) (ref 1.9–3.7)
Glucose, Bld: 321 mg/dL — ABNORMAL HIGH (ref 65–99)
Potassium: 4.8 mmol/L (ref 3.5–5.3)
Sodium: 136 mmol/L (ref 135–146)
Total Bilirubin: 0.3 mg/dL (ref 0.2–1.2)
Total Protein: 6.7 g/dL (ref 6.1–8.1)

## 2019-06-23 LAB — VITAMIN D 25 HYDROXY (VIT D DEFICIENCY, FRACTURES): Vit D, 25-Hydroxy: 57 ng/mL (ref 30–100)

## 2019-06-23 NOTE — Progress Notes (Signed)
Glucose is very elevated-321.  Creatinine and GFR are stable.  CBC stable. Vitamin D is WNL-57.  Please advise to take a maintenance dose of vitamin D 1,000 units by mouth daily.

## 2019-07-03 ENCOUNTER — Other Ambulatory Visit: Payer: Self-pay | Admitting: Cardiology

## 2019-07-04 ENCOUNTER — Ambulatory Visit: Payer: 59 | Admitting: Cardiology

## 2019-07-04 ENCOUNTER — Encounter: Payer: Self-pay | Admitting: Cardiology

## 2019-07-04 VITALS — BP 125/65 | HR 57 | Temp 98.7°F | Ht 68.0 in | Wt 199.0 lb

## 2019-07-04 DIAGNOSIS — Z951 Presence of aortocoronary bypass graft: Secondary | ICD-10-CM | POA: Diagnosis not present

## 2019-07-04 DIAGNOSIS — Z720 Tobacco use: Secondary | ICD-10-CM

## 2019-07-04 DIAGNOSIS — R0789 Other chest pain: Secondary | ICD-10-CM | POA: Diagnosis not present

## 2019-07-04 DIAGNOSIS — I2510 Atherosclerotic heart disease of native coronary artery without angina pectoris: Secondary | ICD-10-CM | POA: Diagnosis not present

## 2019-07-04 NOTE — Telephone Encounter (Signed)
Can we fill this?

## 2019-07-04 NOTE — Progress Notes (Signed)
Primary Physician:  Jarome MatinPaterson, Daniel, MD   Patient ID: [name], male    DOB: June 13, 1966, 53 y.o.   MRN: [id number]  Subjective:    Chief Complaint  Patient presents with   Coronary Artery Disease    follow up    HPI: [name] Peeratrick J [name]  is a 53 y.o. male  with history of Type I Diabetes, rheumatoid arthritis, hyperlipidemia, and CAD. Due to severe triple-vessel coronary artery disease he underwent coronary artery bypass grafting on 04/27/2014.   He has recently sustained left foot fracture and also developed left elbow bursitis which later led to infection.  He has undergone evaluation by Ortho and recently completed antibiotic therapy. Bursitis has now resolved. He is still wearing a boot for left foot fracture.  Last seen 2 weeks ago for atypical chest pain felt to be likely related to musculoskeletal etiology; however, did have some improvement with nitroglycerin. He was started on Amlodipine and now presents for follow up.  He reports that chest pain has improved and is not as severe, but is still occurring intermittently. He is now able to lay on his right side, but chest pain can happen still just while laying and resting. He is not able to exercise due to still being in the right foot boot. No worsening shortness of breath, hemoptysis, or leg swelling.  He is still on light duty due to his left foot fracture.  He was previously able to significantly cut back on tobacco use with the use of Wellbutrin; however, had recently resumed. He reports over the last week cutting back to 5 cigarettes per day with nicotine patches and nicorette gum. He has been making lifestyle changes with diet.   Past Medical History:  Diagnosis Date   Anginal pain (HCC)    Arthritis    RA IN HANDS   Asthma    CAD (coronary artery disease) 04/27/2014   Chronic renal disease, stage 3, moderately decreased glomerular filtration rate between 30-59 mL/min/1.73 square meter 05/03/2014   Coronary  artery disease    Diabetes (HCC) 05/03/2014   Diabetes mellitus without complication (HCC)    type 1   Hyperlipidemia    Left shoulder pain    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/03/2014   Shortness of breath    Sleep apnea    mild OSA, no CPAP   Unstable angina pectoris (HCC) 04/25/2014    Past Surgical History:  Procedure Laterality Date   CARDIAC CATHETERIZATION  04/25/2014   BY DR Jacinto HalimGANJI   CARDIAC CATHETERIZATION N/A 10/30/2015   Procedure: Left Heart Cath and Cors/Grafts Angiography;  Surgeon: Yates DecampJay Ganji, MD;  [facility];  Service: Cardiovascular;  Laterality: N/A;   CARPAL TUNNEL RELEASE     CORONARY ARTERY BYPASS GRAFT N/A 04/27/2014   Procedure: CORONARY ARTERY BYPASS GRAFTING on pump using left internal mammary artery to LAD coronary artery, right great saphenous vein graft to diagonal coronary artery with sequential to OM1 and circumflex coronary arteries. Right greater saphenous vein graft to posterior descending coronary artery. ;  Surgeon: Delight OvensEdward B Gerhardt, MD;  [facility];  Service: Open Heart Surgery;  Laterality: N   elbow drained Left 05/09/2019   ENDOVEIN HARVEST OF GREATER SAPHENOUS VEIN Right 04/27/2014   Procedure: ENDOVEIN HARVEST OF GREATER SAPHENOUS VEIN;  Surgeon: Delight OvensEdward B Gerhardt, MD;  Location: [facility] OR;  Service: Open Heart Surgery;  Laterality: Right;   EXCISION ORAL TUMOR N/A 03/01/2018   Procedure: EXCISION ORAL TUMOR;  Surgeon: Melida Quitter, MD;  Location: [place];  Service: ENT;  Laterality: N/A;   EYE SURGERY     LASER   INTRAOPERATIVE TRANSESOPHAGEAL ECHOCARDIOGRAM N/A 04/27/2014   Procedure: INTRAOPERATIVE TRANSESOPHAGEAL ECHOCARDIOGRAM;  Surgeon: Grace Isaac, MD;  Location: [place];  Service: Open Heart Surgery;  Laterality: N/A;   LEFT HEART CATHETERIZATION WITH CORONARY ANGIOGRAM N/A 04/25/2014   Procedure: LEFT HEART CATHETERIZATION WITH CORONARY ANGIOGRAM;  Surgeon:  Laverda Page, MD;  Location: [facility] CATH LAB;  Service: Cardiovascular;  Laterality: N/A;   MOUTH SURGERY  11/15/2017   tongue surgery     Social History   Socioeconomic History   Marital status: Divorced    Spouse name: Not on file   Number of children: 3   Years of education: Not on file   Highest education level: Not on file  Occupational History   Not on file  Social Needs   Financial resource strain: Not on file   Food insecurity    Worry: Not on file    Inability: Not on file   Transportation needs    Medical: Not on file    Non-medical: Not on file  Tobacco Use   Smoking status: Current Every Day Smoker    Packs/day: 0.50    Years: 34.00    Pack years: 17.00    Types: Cigarettes   Smokeless tobacco: Never Used   Tobacco comment: 10 per day   Substance and Sexual Activity   Alcohol use: Yes    Alcohol/week: 0.0 standard drinks    Comment: RARE   Drug use: No   Sexual activity: Not on file  Lifestyle   Physical activity    Days per week: Not on file    Minutes per session: Not on file   Stress: Not on file  Relationships   Social connections    Talks on phone: Not on file    Gets together: Not on file    Attends religious service: Not on file    Active member of club or organization: Not on file    Attends meetings of clubs or organizations: Not on file    Relationship status: Not on file   Intimate partner violence    Fear of current or ex partner: Not on file    Emotionally abused: Not on file    Physically abused: Not on file    Forced sexual activity: Not on file  Other Topics Concern   Not on file  Social History Narrative   Not on file    Review of Systems  Constitution: Negative for decreased appetite, malaise/fatigue, weight gain and weight loss.  Eyes: Negative for visual disturbance.  Cardiovascular: Positive for chest pain and dyspnea on exertion. Negative for claudication, leg swelling, orthopnea, palpitations and  syncope.  Respiratory: Negative for hemoptysis and wheezing.   Endocrine: Negative for cold intolerance and heat intolerance.  Hematologic/Lymphatic: Does not bruise/bleed easily.  Skin: Negative for nail changes.  Musculoskeletal: Positive for joint pain. Negative for muscle weakness and myalgias.  Gastrointestinal: Positive for bloating and constipation. Negative for abdominal pain, change in bowel habit, nausea and vomiting.  Neurological: Positive for dizziness. Negative for difficulty with concentration, focal weakness and headaches.  Psychiatric/Behavioral: Negative for altered mental status and suicidal ideas.  All other systems reviewed and are negative.     Objective:  Blood pressure 125/65, pulse (!) 57, temperature 98.7 F (37.1 C), height 5\' 8"  (1.727 m), weight 199 lb (90.3 kg), SpO2  97 %. Body mass index is 30.26 kg/m.     Physical Exam  Constitutional: He is oriented to person, place, and time. Vital signs are normal. He appears well-developed and well-nourished.  HENT:  Head: Normocephalic and atraumatic.  Neck: Normal range of motion.  Cardiovascular: Normal rate, regular rhythm, normal heart sounds and intact distal pulses.  Pulses:      Femoral pulses are 2+ on the right side and 2+ on the left side.      Popliteal pulses are 2+ on the right side and 2+ on the left side.       Dorsalis pedis pulses are 1+ on the right side and 1+ on the left side.       Posterior tibial pulses are 1+ on the right side and 1+ on the left side.  Pulmonary/Chest: Effort normal and breath sounds normal. No accessory muscle usage. No respiratory distress. He exhibits no tenderness.  Abdominal: Soft. Bowel sounds are normal.  Musculoskeletal: Normal range of motion.  Neurological: He is alert and oriented to person, place, and time.  Skin: Skin is warm and dry.  Vitals reviewed.  Radiology: No results found.  Laboratory examination:    CMP Latest Ref Rng & Units 06/22/2019  03/21/2019 10/13/2018  Glucose 65 - 99 mg/dL 956(O321(H) 130(Q227(H) 657(Q200(H)  BUN 7 - 25 mg/dL 10 12 12   Creatinine 0.70 - 1.33 mg/dL 4.69(G1.39(H) 2.95(M1.43(H) 8.41(L1.51(H)  Sodium 135 - 146 mmol/L 136 138 138  Potassium 3.5 - 5.3 mmol/L 4.8 4.7 5.2  Chloride 98 - 110 mmol/L 101 103 103  CO2 20 - 32 mmol/L 27 28 29   Calcium 8.6 - 10.3 mg/dL 8.9 9.2 9.2  Total Protein 6.1 - 8.1 g/dL 6.7 6.8 6.5  Total Bilirubin 0.2 - 1.2 mg/dL 0.3 0.4 0.4  Alkaline Phos 38 - 126 U/L - - -  AST 10 - 35 U/L 19 20 19   ALT 9 - 46 U/L 20 21 19    CBC Latest Ref Rng & Units 06/22/2019 03/21/2019 10/13/2018  WBC 3.8 - 10.8 Thousand/uL 10.0 11.3(H) 9.0  Hemoglobin 13.2 - 17.1 g/dL 24.413.2 01.014.0 27.214.4  Hematocrit 38.5 - 50.0 % 40.7 41.4 42.6  Platelets 140 - 400 Thousand/uL 260 231 234   Lipid Panel     Component Value Date/Time   CHOL 55 (L) 02/10/2019 0907   TRIG 43 02/10/2019 0907   HDL 29 (L) 02/10/2019 0907   CHOLHDL 1.9 02/10/2019 0907   LDLCALC 17 02/10/2019 0907   HEMOGLOBIN A1C Lab Results  Component Value Date   HGBA1C 7.4 (H) 04/25/2014   MPG 166 (H) 04/25/2014   TSH No results for input(s): TSH in the last 8760 hours.  PRN Meds:. There are no discontinued medications. Current Meds  Medication Sig   Abatacept (ORENCIA CLICKJECT) 125 MG/ML SOAJ Inject 125 mg into the skin once a week.   amLODipine (NORVASC) 5 MG tablet Take 1 tablet (5 mg total) by mouth daily.   Apoaequorin (PREVAGEN PO) Take by mouth.   Ascorbic Acid (VITAMIN C) 1000 MG tablet Take 1,000 mg by mouth daily.   aspirin 81 MG tablet Take 81 mg by mouth daily.   buPROPion (WELLBUTRIN SR) 150 MG 12 hr tablet Take 1 tablet by mouth once daily   Cholecalciferol (VITAMIN D3) 1.25 MG (50000 UT) CAPS Take by mouth.   COD LIVER OIL PO Take 650 mg by mouth daily.    Cyanocobalamin (VITAMIN B-12 PO) Take by mouth daily.   insulin glargine (LANTUS)  100 UNIT/ML injection Inject 14-25 Units into the skin 2 (two) times daily. 14 in PM 24 in AM   insulin  lispro (HUMALOG) 100 UNIT/ML injection Inject 4-17 Units into the skin See admin instructions. Inject 3-4 times daily with meals per sliding scale instructions on glucose meter: 1 unit for every 10 grams of carbs and CBG calculations   magnesium gluconate (MAGONATE) 500 MG tablet Take 500 mg by mouth daily.    nebivolol (BYSTOLIC) 5 MG tablet Take 1 tablet (5 mg total) by mouth daily.   nitroGLYCERIN (NITROSTAT) 0.4 MG SL tablet Place 1 tablet (0.4 mg total) under the tongue every 5 (five) minutes as needed for chest pain.   Probiotic Product (PROBIOTIC PO) Take 1 tablet by mouth daily.   REPATHA SURECLICK 140 MG/ML SOAJ Inject 140 mg as directed every 14 (fourteen) days.   Zinc 30 MG CAPS Take 1 capsule (30 mg total) by mouth daily.    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 with heteregenous plaque. Antegrade vertebral artery flow. No significant change from 07/16/2017. Follow up in one year is appropriate if clinically indicated.  Coronary angiogram 10/30/2015: Normal LVEF, 55%. Mid LAD occluded after D1. LIMA to LAD, SVG to D1 patent. Circumflex occluded in the midsegment after OM1. SVG to OM2 patent, sequential branch to his to circumflex occluded. Distal circumflex filled by collaterals. Mid RCA 90%, distal RCA 90%. SVG to RCA patent.  Lexiscan sestamibi stress test 10/12/2015: 1. The resting electrocardiogram demonstrated normal sinus rhythm, normal resting conduction, no resting arrhythmias and T changes. Stress EKG is non-diagnostic for ischemia as it a pharmacologic stress using Lexiscan. Stress test converted to Lexiscan due to exerise intolerence and dyspnea. 2. The perfusion imaging study demonstrates a medium size severe ischemia in the inferior and inferolateral wall extending from the base towards the apex. Left ventricular systolic function was normal with ejection fraction of 69%. This is a high risk  study, consider further cardiac work-up.  CABG 04/27/2014: LIMA to LAD, SVG to D1, SVG to OM1 and distal circumflex, SVG to PDA. Dr. Ofilia Neas.  Echocardiogram 06/08/2014: - Left ventricle cavity is normal in size. Moderate concentric hypertrophy of the left ventricle. Normal global wall motion. Visual EF is 60-65%. Calculated EF 56%. - Trileaflet aortic valve with no regurgitation noted. Mild calcification of the aortic valve annulus. - Mild mitral regurgitation. Mild calcification of the mitral valve annulus. - Mild tricuspid regurgitation. No evidence of pulmonary hypertension.  Assessment:     ICD-10-CM   1. Atypical chest pain  R07.89 PCV MYOCARDIAL PERFUSION WITH LEXISCAN  2. Coronary artery disease involving native coronary artery of native heart without angina pectoris  I25.10   3. S/P CABG x 5  Z95.1   4. Tobacco abuse  Z72.0     EKG 06/15/2019: Normal sinus rhythm at 61 bpm, normal axis, diffuse nonspecific T wave abnormality. No changes compared to previous EKG on 05/19/2019.   Recommendations:   Patient has had improvement in chest pain since last by me, but does continue to have some intermittent chest discomfort.  Although his symptoms are fairly suggestive of musculoskeletal etiology, given his risk factors and previous CABG history, will schedule for Lexiscan nuclear stress testing for further evaluation.  Continue with present medications.  His blood pressure is well controlled.  He is on Wellbutrin to help him quit smoking.  He recently has been able to significantly cut back to 5 cigarettes/day  with use of nicotine patches and Nicorette gum.  Encouraged him to continue to work on this.  Follow-up schedule him for telephone appointment after stress test to discuss results, unless markedly abnormal, will plan to see him in the office.  Toniann Fail, MSN, APRN, FNP-C [facility] Office: [phone] Fax: [phone]

## 2019-07-05 NOTE — Telephone Encounter (Signed)
yes

## 2019-07-11 ENCOUNTER — Telehealth: Payer: Self-pay | Admitting: *Deleted

## 2019-07-11 NOTE — Telephone Encounter (Signed)
Patient [name], needs auth for [place] for [facility], [phone]. Thank you.

## 2019-07-11 NOTE — Telephone Encounter (Signed)
Submitted a Prior Authorization request to [facility] for [facility] via Cover My Meds. Will update once we receive a response.  [id number]

## 2019-07-12 ENCOUNTER — Other Ambulatory Visit: Payer: Self-pay

## 2019-07-12 MED ORDER — NEBIVOLOL HCL 5 MG PO TABS: 5.0000 mg | ORAL_TABLET | Freq: Every day | ORAL | 3 refills | Status: DC

## 2019-07-13 ENCOUNTER — Other Ambulatory Visit: Payer: Self-pay | Admitting: *Deleted

## 2019-07-13 MED ORDER — ORENCIA CLICKJECT 125 MG/ML ~~LOC~~ SOAJ: 125.0000 mg | SUBCUTANEOUS | 0 refills | Status: DC

## 2019-07-13 NOTE — Telephone Encounter (Signed)
Received response from insurance in reference to [place]- This medication or product is on your plan's list of covered drugs. Prior authorization is not required at this time. If your pharmacy has questions regarding the processing of your prescription, please have them call the [facility] pharmacy help desk at [phone].   Patient may need refills to be sent in.  8:07 AM Beatriz Chancellor, CPhT

## 2019-07-18 ENCOUNTER — Ambulatory Visit: Payer: 59 | Admitting: Orthopaedic Surgery

## 2019-07-18 ENCOUNTER — Ambulatory Visit (INDEPENDENT_AMBULATORY_CARE_PROVIDER_SITE_OTHER): Payer: 59

## 2019-07-18 ENCOUNTER — Encounter: Payer: Self-pay | Admitting: Orthopaedic Surgery

## 2019-07-18 ENCOUNTER — Other Ambulatory Visit: Payer: Self-pay

## 2019-07-18 DIAGNOSIS — M84375D Stress fracture, left foot, subsequent encounter for fracture with routine healing: Secondary | ICD-10-CM

## 2019-07-18 NOTE — Progress Notes (Signed)
We are continuing to follow the patient for a left foot fifth metatarsal fracture.  This is a Jones type fracture.  He has been in a cam walking boot.  He is a diabetic and does have slight peripheral vascular disease.  He has been on light duty work.  He reports only mild pain.  He has been in a cam walking boot with weightbearing as tolerated.  On exam there is no swelling on his left foot.  There is minimal pain at the fracture site.  3 views of the left foot are obtained and the fracture line is still visible but there is certainly interval healing.  There are calcifications of his vessels showing peripheral vascular disease.  He understands that this does delay his healing with a combination of peripheral vascular disease and diabetes.  He can transition to a regular shoe but I will need to keep him on light duty work with no high impact activities.  We will see him back in 4 weeks and I would like to repeat 3 views of the left foot.  All question concerns were answered and addressed.

## 2019-07-19 ENCOUNTER — Telehealth: Payer: Self-pay | Admitting: Rheumatology

## 2019-07-19 NOTE — Telephone Encounter (Signed)
Patient called stating since he switched from Enbrel to [facility] he has increased pain in his shoulders and having difficulty walking.  Patient states he has been experiencing pain for about a month and is not sure if his arthritis is getting worse or if the [name] is not as effective as the Enbrel.  Patient is requesting a return call.

## 2019-07-19 NOTE — Telephone Encounter (Signed)
yes

## 2019-07-19 NOTE — Telephone Encounter (Signed)
Patient is on Orencia 125 mg sq weekly injections (started in September 2020). Inadequate response to Enbrel, Humira, D/c MTX, Arava-SE, PLQ-retinopathy.   Last visit: 06/22/2019 "He has no active inflammation at this time.  He has been having some discomfort in the left knee joint but no warmth or effusion was noted.  He has stiffness in both hands but now synovitis was noted."   Would you like to schedule a virtual visit to discuss treatment options?

## 2019-07-20 ENCOUNTER — Ambulatory Visit (INDEPENDENT_AMBULATORY_CARE_PROVIDER_SITE_OTHER): Payer: 59

## 2019-07-20 ENCOUNTER — Other Ambulatory Visit: Payer: 59

## 2019-07-20 ENCOUNTER — Other Ambulatory Visit: Payer: Self-pay

## 2019-07-20 DIAGNOSIS — R0789 Other chest pain: Secondary | ICD-10-CM | POA: Diagnosis not present

## 2019-07-20 NOTE — Telephone Encounter (Signed)
Patient is scheduled for a virtual visit on 07/21/2019 to discuss treatment options.

## 2019-07-20 NOTE — Progress Notes (Signed)
Virtual Visit via Telephone Note  I connected with [name] on 07/21/19 at 10:30 AM EST by telephone and verified that I am speaking with the correct person using two identifiers.  Location: Patient: Home Provider: Clinic  This service was conducted via virtual visit. The patient was located at home. I was located in my office.  Consent was obtained prior to the virtual visit and is aware of possible charges through their insurance for this visit.  The patient is an established patient.  Dr. Estanislado Pandy, MD conducted the virtual visit and [name] Sams, PA-C acted as scribe during the service.  Office staff helped with scheduling follow up visits after the service was conducted.   I discussed the limitations, risks, security and privacy concerns of performing an evaluation and management service by telephone and the availability of in person appointments. I also discussed with the patient that there may be a patient responsible charge related to this service. The patient expressed understanding and agreed to proceed.  CC: Generalized arthralgias  History of Present Illness: Patient is a 53 year old male with a past medical history of seropositive rheumatoid athritis and osteoarthritis.  He is on Orencia 125 mg sq injections once weekly (started in September 2020).  He reports that several days ago he was experiencing increased pain in both shoulder joints and generalized arthralgias for about 4 days.  He denies any joint swelling at that time.  He was having difficulty with his normal activities due to the discomfort.  He reports he felt that Enbrel was more effective than Orenica, but he likes the side effect profile of Orencia better. He was evaluated by Dr. Ninfa [name] for follow up on the left 5th metatarsal Jones Fracture. He has delayed healing related to PVD and diabetes.  He has transitioned from a cam walking boot to a regular shoe with the restriction of light duty activities.    Review of  Systems  Constitutional: Negative for fever and malaise/fatigue.  Eyes: Negative for photophobia, pain, discharge and redness.  Respiratory: Negative for cough, shortness of breath and wheezing.   Cardiovascular: Negative for chest pain and palpitations.  Gastrointestinal: Negative for blood in stool, constipation and diarrhea.  Genitourinary: Negative for dysuria.  Musculoskeletal: Positive for joint pain. Negative for back pain, myalgias and neck pain.  Skin: Negative for rash.  Neurological: Negative for dizziness and headaches.  Psychiatric/Behavioral: Negative for depression. The patient is not nervous/anxious and does not have insomnia.       Observations/Objective: Physical Exam  Constitutional: He is oriented to person, place, and time.  Neurological: He is alert and oriented to person, place, and time.  Psychiatric: Mood, memory, affect and judgment normal.   Patient reports morning stiffness for 30 minutes.   Patient reports nocturnal pain.  Difficulty dressing/grooming: Denies Difficulty climbing stairs: Reports Difficulty getting out of chair: Reports Difficulty using hands for taps, buttons, cutlery, and/or writing: Denies   Assessment and Plan: Visit Diagnoses: Rheumatoid arthritis involving multiple sites with positive rheumatoid factor ([name]): He has been experiencing increased arthralgias and joint stiffness for the past 1 week.  He has not noticed any joint inflammation.  He continues to inject Orencia 125 mg sq once weekly (started in September 2020).  He is concerned that [name] may be less effective than Enbrel was in the past.  Due to not have any synovitis at his last visit on 06/22/19 and no inflammation at this time he was advised to continue on Orencia as prescribed  at this time. We will add a future order for ESR to be drawn with his upcoming lab work. He was advised to notify us if he has persistent joint pain and inflammation and we can discuss other  treatment options or restarting on Enbrel.  He can take tylenol arthritis sparingly for pain relief. He will follow up in 2 months.    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  High risk medication use - Orencia 125 mg sq weekly injections (started in September 2020). Inadequate response to Enbrel, Humira, D/c MTX, Arava-SE, PLQ-retinopathy.    Septic olecranon bursitis of left elbow: Resolved.   Primary osteoarthritis of both knees: He is not experiencing any joint pain or inflammation at this time.   Closed nondisplaced fracture of fifth metatarsal bone of left foot with routine healing, subsequent encounter: He was evaluated by Dr. Ninfa [name] for follow up on the left 5th metatarsal Jones Fracture on 07/18/19. He has delayed healing related to PVD and diabetes.  He has transitioned from a cam walking boot to a regular shoe with the restriction of light duty activities.  He will be following up with Dr. Ninfa [name] in 4 weeks.   Osteopenia of multiple sites -DEXA 06/09/2019 T score -1.9 right femoral neck.  Calcium and vitamin D was recommended. Vitamin D was 57 on 06/22/19. He recently had a stress fracture of the left fifth metatarsal.    Other medical conditions are listed as follows:  History of coronary artery disease  History of hypertension  History of diabetes mellitus  History of chronic kidney disease  Dyslipidemia  Tobacco abuse   Follow Up Instructions: He will follow up in 2 months.    I discussed the assessment and treatment plan with the patient. The patient was provided an opportunity to ask questions and all were answered. The patient agreed with the plan and demonstrated an understanding of the instructions.   The patient was advised to call back or seek an in-person evaluation if the symptoms worsen or if the condition fails to improve as anticipated.  I provided 25 minutes of non-face-to-face time during this encounter.   [name] Merino, MD     Scribed by-  [name] , PA-C

## 2019-07-21 ENCOUNTER — Other Ambulatory Visit: Payer: Self-pay

## 2019-07-21 ENCOUNTER — Encounter: Payer: Self-pay | Admitting: Rheumatology

## 2019-07-21 ENCOUNTER — Telehealth (INDEPENDENT_AMBULATORY_CARE_PROVIDER_SITE_OTHER): Payer: 59 | Admitting: Rheumatology

## 2019-07-21 DIAGNOSIS — E785 Hyperlipidemia, unspecified: Secondary | ICD-10-CM

## 2019-07-21 DIAGNOSIS — M0579 Rheumatoid arthritis with rheumatoid factor of multiple sites without organ or systems involvement: Secondary | ICD-10-CM | POA: Diagnosis not present

## 2019-07-21 DIAGNOSIS — M0630 Rheumatoid nodule, unspecified site: Secondary | ICD-10-CM | POA: Diagnosis not present

## 2019-07-21 DIAGNOSIS — Z79899 Other long term (current) drug therapy: Secondary | ICD-10-CM | POA: Diagnosis not present

## 2019-07-21 DIAGNOSIS — M170 Bilateral primary osteoarthritis of knee: Secondary | ICD-10-CM

## 2019-07-21 DIAGNOSIS — Z720 Tobacco use: Secondary | ICD-10-CM

## 2019-07-21 DIAGNOSIS — S92355D Nondisplaced fracture of fifth metatarsal bone, left foot, subsequent encounter for fracture with routine healing: Secondary | ICD-10-CM

## 2019-07-21 DIAGNOSIS — M71122 Other infective bursitis, left elbow: Secondary | ICD-10-CM | POA: Diagnosis not present

## 2019-07-21 DIAGNOSIS — Z8639 Personal history of other endocrine, nutritional and metabolic disease: Secondary | ICD-10-CM

## 2019-07-21 DIAGNOSIS — Z87448 Personal history of other diseases of urinary system: Secondary | ICD-10-CM

## 2019-07-21 DIAGNOSIS — M8589 Other specified disorders of bone density and structure, multiple sites: Secondary | ICD-10-CM

## 2019-07-21 DIAGNOSIS — Z8679 Personal history of other diseases of the circulatory system: Secondary | ICD-10-CM

## 2019-07-25 ENCOUNTER — Other Ambulatory Visit: Payer: Self-pay | Admitting: Cardiology

## 2019-08-08 ENCOUNTER — Encounter: Payer: Self-pay | Admitting: Cardiology

## 2019-08-08 ENCOUNTER — Other Ambulatory Visit: Payer: Self-pay

## 2019-08-08 ENCOUNTER — Telehealth: Payer: 59 | Admitting: Cardiology

## 2019-08-08 VITALS — Ht 68.0 in | Wt 190.0 lb

## 2019-08-08 DIAGNOSIS — R0789 Other chest pain: Secondary | ICD-10-CM

## 2019-08-08 DIAGNOSIS — I2510 Atherosclerotic heart disease of native coronary artery without angina pectoris: Secondary | ICD-10-CM | POA: Diagnosis not present

## 2019-08-08 DIAGNOSIS — E7800 Pure hypercholesterolemia, unspecified: Secondary | ICD-10-CM | POA: Diagnosis not present

## 2019-08-08 DIAGNOSIS — I10 Essential (primary) hypertension: Secondary | ICD-10-CM | POA: Diagnosis not present

## 2019-08-08 DIAGNOSIS — Z720 Tobacco use: Secondary | ICD-10-CM

## 2019-08-08 NOTE — Progress Notes (Signed)
Primary Physician:  [name], MD   Patient ID: [name], male    DOB: [birth year], 54 y.o.   MRN: [id number]  Subjective:    Chief Complaint  Patient presents with   Chest Pain   This visit type was conducted due to national recommendations for restrictions regarding the COVID-19 Pandemic (e.g. social distancing).  This format is felt to be most appropriate for this patient at this time.  All issues noted in this document were discussed and addressed.  No physical exam was performed (except for noted visual exam findings with Telehealth visits).  The patient has consented to conduct a Telehealth visit and understands insurance will be billed.   I discussed the limitations of evaluation and management by telemedicine and the availability of in person appointments. The patient expressed understanding and agreed to proceed.  Virtual Visit via Video Note is as below  I connected with@, on 08/08/2019 at 1535 by a video enabled telemedicine application and verified that I am speaking with the correct person using two identifiers.     I have discussed with her regarding the safety during COVID Pandemic and steps and precautions including social distancing with the patient.    HPI: [name]  is a 54 y.o. male  with history of Type I Diabetes, rheumatoid arthritis, hyperlipidemia, and CAD. Due to severe triple-vessel coronary artery disease he underwent coronary artery bypass grafting on 04/27/2014.   He has recently sustained left foot fracture and also developed left elbow bursitis which later led to infection.  He has undergone evaluation by Ortho and recently completed antibiotic therapy. Bursitis has now resolved. He is still wearing a boot for left foot fracture.  Patient has recently been seen for symptoms of chest pain, felt to be musculoskeletal etiology; however, did have one episode that was relieved with nitroglycerin. He underwent lexiscan nuclear stress  testing and now presents for follow up.  Chest pain has continued to improve. He did have 1 episode last night of chest discomfort while lying in bed, appeared to be positional. No worsening shortness of breath, hemoptysis, or leg swelling.  He is still on light duty due to his left foot fracture; however, is following up with ortho next week and is anticipating being released to full time duty.  He was previously able to significantly cut back on tobacco use with the use of Wellbutrin; however, had recently resumed. He reports over the last week cutting back to 5 cigarettes per day with nicotine patches and nicorette gum. He has been making lifestyle changes with diet.   Past Medical History:  Diagnosis Date   Anginal pain (HCC)    Arthritis    RA IN HANDS   Asthma    CAD (coronary artery disease) 04/27/2014   Chronic renal disease, stage 3, moderately decreased glomerular filtration rate between 30-59 mL/min/1.73 square meter 05/03/2014   Coronary artery disease    Diabetes (HCC) 05/03/2014   Diabetes mellitus without complication (HCC)    type 1   Hyperlipidemia    Left shoulder pain    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/03/2014   Shortness of breath    Sleep apnea    mild OSA, no CPAP   Unstable angina pectoris (HCC) 04/25/2014    Past Surgical History:  Procedure Laterality Date   CARDIAC CATHETERIZATION  04/25/2014   BY DR Jacinto Halim   CARDIAC CATHETERIZATION N/A 10/30/2015   Procedure: Left Heart Cath and Cors/Grafts Angiography;  Surgeon: Vonna Kotyk  Jacinto Halim, MD;  Location: [facility];  Service: Cardiovascular;  Laterality: N/A;   CARPAL TUNNEL RELEASE     CORONARY ARTERY BYPASS GRAFT N/A 04/27/2014   Procedure: CORONARY ARTERY BYPASS GRAFTING on pump using left internal mammary artery to LAD coronary artery, right great saphenous vein graft to diagonal coronary artery with sequential to OM1 and circumflex coronary arteries. Right greater saphenous  vein graft to posterior descending coronary artery. ;  Surgeon: Delight Ovens, MD;  Location: [facility] OR;  Service: Open Heart Surgery;  Laterality: N   elbow drained Left 05/09/2019   ENDOVEIN HARVEST OF GREATER SAPHENOUS VEIN Right 04/27/2014   Procedure: ENDOVEIN HARVEST OF GREATER SAPHENOUS VEIN;  Surgeon: Delight Ovens, MD;  Location: [facility] OR;  Service: Open Heart Surgery;  Laterality: Right;   EXCISION ORAL TUMOR N/A 03/01/2018   Procedure: EXCISION ORAL TUMOR;  Surgeon: Christia Reading, MD;  Location: [facility];  Service: ENT;  Laterality: N/A;   EYE SURGERY     LASER   INTRAOPERATIVE TRANSESOPHAGEAL ECHOCARDIOGRAM N/A 04/27/2014   Procedure: INTRAOPERATIVE TRANSESOPHAGEAL ECHOCARDIOGRAM;  Surgeon: Delight Ovens, MD;  Location: [facility] OR;  Service: Open Heart Surgery;  Laterality: N/A;   LEFT HEART CATHETERIZATION WITH CORONARY ANGIOGRAM N/A 04/25/2014   Procedure: LEFT HEART CATHETERIZATION WITH CORONARY ANGIOGRAM;  Surgeon: Pamella Pert, MD;  Location: [facility] CATH LAB;  Service: Cardiovascular;  Laterality: N/A;   MOUTH SURGERY  11/15/2017   tongue surgery     Social History   Socioeconomic History   Marital status: Divorced    Spouse name: Not on file   Number of children: 3   Years of education: Not on file   Highest education level: Not on file  Occupational History   Not on file  Tobacco Use   Smoking status: Current Every Day Smoker    Packs/day: 0.50    Years: 34.00    Pack years: 17.00    Types: Cigarettes   Smokeless tobacco: Never Used   Tobacco comment: 10 per day   Substance and Sexual Activity   Alcohol use: Yes    Alcohol/week: 0.0 standard drinks    Comment: RARE   Drug use: No   Sexual activity: Not on file  Other Topics Concern   Not on file  Social History Narrative   Not on file   Social Determinants of Health   Financial Resource Strain:    Difficulty of Paying Living Expenses: Not on file  Food Insecurity:     Worried About Running Out of Food in the Last Year: Not on file   Ran Out of Food in the Last Year: Not on file  Transportation Needs:    Lack of Transportation (Medical): Not on file   Lack of Transportation (Non-Medical): Not on file  Physical Activity:    Days of Exercise per Week: Not on file   Minutes of Exercise per Session: Not on file  Stress:    Feeling of Stress : Not on file  Social Connections:    Frequency of Communication with Friends and Family: Not on file   Frequency of Social Gatherings with Friends and Family: Not on file   Attends Religious Services: Not on file   Active Member of Clubs or Organizations: Not on file   Attends Banker Meetings: Not on file   Marital Status: Not on file  Intimate Partner Violence:    Fear of Current or Ex-Partner: Not on file   Emotionally  Abused: Not on file   Physically Abused: Not on file   Sexually Abused: Not on file    Review of Systems  Constitution: Negative for decreased appetite, malaise/fatigue, weight gain and weight loss.  Eyes: Negative for visual disturbance.  Cardiovascular: Positive for chest pain (improved) and dyspnea on exertion. Negative for claudication, leg swelling, orthopnea, palpitations and syncope.  Respiratory: Negative for hemoptysis and wheezing.   Endocrine: Negative for cold intolerance and heat intolerance.  Hematologic/Lymphatic: Does not bruise/bleed easily.  Skin: Negative for nail changes.  Musculoskeletal: Positive for joint pain. Negative for muscle weakness and myalgias.  Gastrointestinal: Positive for bloating and constipation. Negative for abdominal pain, change in bowel habit, nausea and vomiting.  Neurological: Positive for dizziness. Negative for difficulty with concentration, focal weakness and headaches.  Psychiatric/Behavioral: Negative for altered mental status and suicidal ideas.  All other systems reviewed and are negative.     Objective:    Height  (1.727 m), weight 190 lb (86.2 kg). Body mass index is 28.89 kg/m.    Physical exam not performed or limited due to virtual visit.  Patient appeared to be in no distress, Neck was supple, respiration was not labored.  Please see exam details from prior visit is as below.   Physical Exam  Constitutional: He is oriented to person, place, and time. Vital signs are normal. He appears well-developed and well-nourished.  HENT:  Head: Normocephalic and atraumatic.  Cardiovascular: Normal rate, regular rhythm, normal heart sounds and intact distal pulses.  Pulses:      Femoral pulses are 2+ on the right side and 2+ on the left side.      Popliteal pulses are 2+ on the right side and 2+ on the left side.       Dorsalis pedis pulses are 1+ on the right side and 1+ on the left side.       Posterior tibial pulses are 1+ on the right side and 1+ on the left side.  Pulmonary/Chest: Effort normal and breath sounds normal. No accessory muscle usage. No respiratory distress. He exhibits no tenderness.  Abdominal: Soft. Bowel sounds are normal.  Musculoskeletal:        General: Normal range of motion.     Cervical back: Normal range of motion.  Neurological: He is alert and oriented to person, place, and time.  Skin: Skin is warm and dry.  Vitals reviewed.  Radiology: No results found.  Laboratory examination:    CMP Latest Ref Rng & Units 06/22/2019 03/21/2019 10/13/2018  Glucose 65 - 99 mg/dL 161(W) 960(A) 540(J)  BUN 7 - 25 mg/dL Creatinine 0.70 - 1.33 mg/dL 8.11(B) 1.47(W) 2.95(A)  Sodium 135 - 146 mmol/L 136 138 138  Potassium 3.5 - 5.3 mmol/L 4.8 4.7 5.2  Chloride 98 - 110 mmol/L 101 103 103  CO2 20 - 32 mmol/L Calcium 8.6 - 10.3 mg/dL 8.9 9.2 9.2  Total Protein 6.1 - 8.1 g/dL 6.7 6.8 6.5  Total Bilirubin 0.2 - 1.2 mg/dL 0.3 0.4 0.4  Alkaline Phos 38 - 126 U/L - - -  AST 10 - 35 U/L ALT 9 - 46 U/L CBC Latest Ref Rng & Units  06/22/2019 03/21/2019 10/13/2018  WBC 3.8 - 10.8 Thousand/uL 10.0 11.3(H) 9.0  Hemoglobin 13.2 - 17.1 g/dL 21.3 08.6 57.8  Hematocrit 38.5 - 50.0 % 40.7 41.4 42.6  Platelets 140 - 400 Thousand/uL 260 231  234   Lipid Panel     Component Value Date/Time   CHOL 55 (L) 02/10/2019 0907   TRIG 43 02/10/2019 0907   HDL 29 (L) 02/10/2019 0907   CHOLHDL 1.9 02/10/2019 0907   LDLCALC 17 02/10/2019 0907   HEMOGLOBIN A1C Lab Results  Component Value Date   HGBA1C 7.4 (H) 04/25/2014   MPG 166 (H) 04/25/2014   TSH No results for input(s): TSH in the last 8760 hours.  PRN Meds:. There are no discontinued medications. Current Meds  Medication Sig   Abatacept (ORENCIA CLICKJECT) 720 MG/ML SOAJ Inject 125 mg into the skin once a week.   Ascorbic Acid (VITAMIN C) 1000 MG tablet Take 1,000 mg by mouth daily.   aspirin 81 MG tablet Take 81 mg by mouth daily.   buPROPion (WELLBUTRIN SR) 150 MG 12 hr tablet Take 1 tablet by mouth once daily   Cholecalciferol (VITAMIN D3) 1.25 MG (50000 UT) CAPS Take by mouth.   COD LIVER OIL PO Take 650 mg by mouth daily.    Cyanocobalamin (VITAMIN B-12 PO) Take by mouth daily.   insulin glargine (LANTUS) 100 UNIT/ML injection Inject 14-25 Units into the skin 2 (two) times daily. 14 in PM 24 in AM   insulin lispro (HUMALOG) 100 UNIT/ML injection Inject 4-17 Units into the skin See admin instructions. Inject 3-4 times daily with meals per sliding scale instructions on glucose meter: 1 unit for every 10 grams of carbs and CBG calculations   magnesium gluconate (MAGONATE) 500 MG tablet Take 500 mg by mouth daily.    nebivolol (BYSTOLIC) 5 MG tablet Take 1 tablet (5 mg total) by mouth daily.   nitroGLYCERIN (NITROSTAT) 0.4 MG SL tablet Place 1 tablet (0.4 mg total) under the tongue every 5 (five) minutes as needed for chest pain.   Probiotic Product (PROBIOTIC PO) Take 1 tablet by mouth daily.   REPATHA SURECLICK 947 MG/ML SOAJ INJECT 140MG  SUBCUTANEOUSLY  EVERY 2 WEEKS   Zinc 30 MG CAPS Take 1 capsule (30 mg total) by mouth daily.    Cardiac Studies:   Lexiscan Tetrofosmin Stress Test  07/20/2019: Nondiagnostic ECG stress. There is a small sized mild reversible mild defect in the lateral region.  Compared to the study done on 10/12/2015, large inferolateral ischemia not present. Defect is consistent with known occluded distal circumflex coronary artery. Stress LV EF: 62%.  Intermediate risk study.   Carotid artery duplex 07/07/2018: Stenosis in the bilateral internal carotid artery (16-49%). with heteregenous plaque. Antegrade vertebral artery flow. No significant change from 07/16/2017. Follow up in one year is appropriate if clinically indicated.  Coronary angiogram 10/30/2015: Normal LVEF, 55%. Mid LAD occluded after D1. LIMA to LAD, SVG to D1 patent. Circumflex occluded in the midsegment after OM1. SVG to OM2 patent, sequential branch to his to circumflex occluded. Distal circumflex filled by collaterals. Mid RCA 90%, distal RCA 90%. SVG to RCA patent.  Lexiscan sestamibi stress test 10/12/2015: 1. The resting electrocardiogram demonstrated normal sinus rhythm, normal resting conduction, no resting arrhythmias and T changes. Stress EKG is non-diagnostic for ischemia as it a pharmacologic stress using Lexiscan. Stress test converted to Lexiscan due to exerise intolerence and dyspnea. 2. The perfusion imaging study demonstrates a medium size severe ischemia in the inferior and inferolateral wall extending from the base towards the apex. Left ventricular systolic function was normal with ejection fraction of 69%. This is a high risk study, consider further cardiac work-up.  CABG 04/27/2014: LIMA to LAD, SVG to  D1, SVG to OM1 and distal circumflex, SVG to PDA. Dr. Ofilia Neas.  Echocardiogram 06/08/2014: - Left ventricle cavity is normal in size. Moderate concentric hypertrophy of the left ventricle. Normal global wall motion. Visual EF is  60-65%. Calculated EF 56%. - Trileaflet aortic valve with no regurgitation noted. Mild calcification of the aortic valve annulus. - Mild mitral regurgitation. Mild calcification of the mitral valve annulus. - Mild tricuspid regurgitation. No evidence of pulmonary hypertension.  Assessment:     ICD-10-CM   1. Coronary artery disease involving native coronary artery of native heart without angina pectoris  I25.10   2. Musculoskeletal chest pain  R07.89   3. Primary hypertension  I10   4. Pure hypercholesterolemia  E78.00   5. Tobacco abuse  Z72.0     EKG 06/15/2019: Normal sinus rhythm at 61 bpm, normal axis, diffuse nonspecific T wave abnormality. No changes compared to previous EKG on 05/19/2019.   Recommendations:   Patient has had continued improvement in chest pain except for one episode since last seen by me.  I discussed his recent Lexiscan nuclear stress test that is overall improved compared to previous stress test in 2017.  He does have small sized mild reversible mild defect in the lateral region consistent with known occluded distal circumflex coronary artery.  While his chest pain symptoms are fairly consistent with musculoskeletal etiology, I would recommend continued medical management unless he has worsening symptoms, could consider further cardiac work-up.  Discussed continued aggressive risk factor modification.  His lipids are well controlled with Repatha and will continue the same.  I have recommended that he stay on amlodipine as he has been tolerating this well.  Encouraged him to continue to work on smoking cessation.  He is hoping to resume exercise next week after being cleared by orthopedics.  I will plan to see him back in 3 months for follow-up, but encouraged him to contact me sooner if needed.   Toniann Fail, MSN, APRN, FNP-C [facility] [place] Office: [phone] Fax: [phone]

## 2019-08-09 MED ORDER — AMLODIPINE BESYLATE 5 MG PO TABS: 5.0000 mg | ORAL_TABLET | Freq: Every day | ORAL | 2 refills | Status: DC

## 2019-08-15 ENCOUNTER — Ambulatory Visit: Payer: Self-pay

## 2019-08-15 ENCOUNTER — Other Ambulatory Visit: Payer: Self-pay

## 2019-08-15 ENCOUNTER — Encounter: Payer: Self-pay | Admitting: Orthopaedic Surgery

## 2019-08-15 ENCOUNTER — Ambulatory Visit (INDEPENDENT_AMBULATORY_CARE_PROVIDER_SITE_OTHER): Payer: 59 | Admitting: Orthopaedic Surgery

## 2019-08-15 DIAGNOSIS — M84375D Stress fracture, left foot, subsequent encounter for fracture with routine healing: Secondary | ICD-10-CM

## 2019-08-15 NOTE — Progress Notes (Signed)
The patient is multiple months status post 1/5 metatarsal fracture of his left foot.  He says he has minimal pain discomfort and and is ready to return to full work activities.  He is a Psychologist, educational.  He is 54 years old.  On examination of his left foot there is no swelling.  His range of motion of the ankle and foot are normal.  His sensation is normal and his foot is well-perfused.  There is minimal pain over the fifth metatarsal.  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  Right now he is interested in in attempting full work duty since his pain is minimal.  He understands we may still end up recommending surgery if he has persistent symptoms.  For now though, I would hold off on any surgery since he is basically almost asymptomatic.  He says his full work duties are not strenuous.  Without being said, we will see him back in 3 months with a repeat 2 views of his left foot but I would like these to be an AP and oblique view.

## 2019-08-22 ENCOUNTER — Other Ambulatory Visit: Payer: Self-pay | Admitting: Rheumatology

## 2019-08-22 NOTE — Telephone Encounter (Signed)
Last Visit: 07/21/19 Next Visit:  09/26/19 Labs: 06/22/19 Glucose is very elevated-321. Creatinine and GFR are stable. CBC stable.  TB Gold: 03/21/19 Neg   Okay to refill per Dr. Corliss Skains

## 2019-08-24 ENCOUNTER — Telehealth: Payer: Self-pay | Admitting: Pharmacy Technician

## 2019-08-24 NOTE — Telephone Encounter (Signed)
Received notification from [facility] regarding a prior authorization for [facility]. Authorization has been APPROVED from 08/24/19 to 08/23/20 or unless formulary changes.   Will send document to scan center.  Authorization # [id number] Phone # [phone]

## 2019-08-24 NOTE — Telephone Encounter (Signed)
Submitted a Prior Authorization request to [employer] for ORENCIA via Cover My Meds. Will update once we receive a response.   

## 2019-09-16 ENCOUNTER — Other Ambulatory Visit: Payer: Self-pay | Admitting: Rheumatology

## 2019-09-16 NOTE — Telephone Encounter (Signed)
Last Visit: 07/21/2019 telemedicine  Next Visit: 09/26/2019 Labs: 06/22/2019 Glucose is very elevated-321. Creatinine and GFR are stable. CBC stable. Vitamin D is WNL-57.  TB Gold: 03/21/2019 negative   Okay to refill per Dr. Corliss Skains.

## 2019-09-19 DIAGNOSIS — S92919A Unspecified fracture of unspecified toe(s), initial encounter for closed fracture: Secondary | ICD-10-CM | POA: Insufficient documentation

## 2019-09-19 DIAGNOSIS — L089 Local infection of the skin and subcutaneous tissue, unspecified: Secondary | ICD-10-CM | POA: Insufficient documentation

## 2019-09-19 DIAGNOSIS — M79672 Pain in left foot: Secondary | ICD-10-CM | POA: Insufficient documentation

## 2019-09-20 ENCOUNTER — Encounter: Payer: Self-pay | Admitting: Orthopaedic Surgery

## 2019-09-20 ENCOUNTER — Other Ambulatory Visit: Payer: Self-pay

## 2019-09-20 ENCOUNTER — Ambulatory Visit: Payer: 59 | Admitting: Orthopaedic Surgery

## 2019-09-20 DIAGNOSIS — L97521 Non-pressure chronic ulcer of other part of left foot limited to breakdown of skin: Secondary | ICD-10-CM

## 2019-09-20 NOTE — Progress Notes (Signed)
Office Visit Note   Patient: [name]           Date of Birth: 09/13/1965           MRN: [id number] Visit Date: 09/20/2019              Requested by: Leanna Battles, MD [place],  [place] [address] PCP: Leanna Battles, MD   Assessment & Plan: Visit Diagnoses:  1. Plantar ulcer of left foot, limited to breakdown of skin ([place])     Plan: I did use a #15 blade just to pare the callus down and got some good bleeding tissue.  I placed Bactroban ointment on this and a Band-Aid.  Regular try metatarsal pad where we cut out area for him to wear in his shoe when he gets his new shoes.  He has no work on continuing good blood glucose control and watch his feet daily.  This should improve with better shoe wear and the metatarsal pad.  All question concerns were answered and addressed.  Follow-up can be as needed.  Follow-Up Instructions: Return if symptoms worsen or fail to improve.   Orders:  No orders of the defined types were placed in this encounter.  No orders of the defined types were placed in this encounter.     Procedures: No procedures performed   Clinical Data: No additional findings.   Subjective: Chief Complaint  Patient presents with  . Left Foot - Wound Check  Mr. Vastine is actually well-known to [place].  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  He is also an insulin-dependent diabetic.  He reports a hemoglobin A1c that is in the 6 range.  He did recently notice a wound under his first metatarsal on the left foot which is worrisome for him.  He had some burning type of sensation in his foot as well.  He says a few days ago he cannot even touch the spot that he points on the plantar aspect of his foot but now he can.  He saw his primary care physician who put him on doxycycline.  He is also been soaking his foot nightly and room temperature water without some salt.  He reports again good blood glucose control.  He also has new shoes that  are coming in the mail that he is ordered because his shoe wear which we can see today is showing significant signs of wear on the shoes.  HPI  Review of Systems He currently denies any fever, chills, nausea, vomiting  Objective: Vital Signs: There were no vitals taken for this visit.  Physical Exam He is alert and orient x3 and in no acute distress Ortho Exam Examination of his left foot shows a small callus under the first MTP joint with appears to have some slight blistering to it.  There is no evidence of infection.  There is no redness and he has full motion passively and actively at the MTP joint. Specialty Comments:  No specialty comments available.  Imaging: No results found.   PMFS History: Patient Active Problem List   Diagnosis Date Noted  . Chronic venous insufficiency 10/05/2018  . Controlled type 1 diabetes mellitus with diabetic peripheral angiopathy without gangrene ([place]) 08/14/2017  . Dyslipidemia 03/26/2017  . High risk medication use 09/23/2016  . History of hypertension 09/23/2016  . History of chronic kidney disease 09/23/2016  . History of coronary artery disease 09/23/2016  . Tobacco abuse 09/23/2016  . Primary  osteoarthritis of both knees 09/23/2016  . History of diabetes mellitus 09/23/2016  . 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 Chest pain with high risk for cardiac etiology 10/29/2015  . Asymptomatic bilateral carotid artery stenosis 06/08/2014  . Pleural effusion, bilateral 06/03/2014  . Dyspnea 06/01/2014  . Diabetes (HCC) 05/03/2014  . 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/03/2014  . 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 Chronic renal disease, stage 3, moderately decreased glomerular filtration rate between 30-59 mL/min/1.73 square meter 05/03/2014  . CAD (coronary artery disease) 04/27/2014   Past Medical History:  Diagnosis Date  . Anginal pain (HCC)   . Arthritis    RA IN HANDS  . Asthma   . CAD (coronary artery disease)  04/27/2014  . Chronic renal disease, stage 3, moderately decreased glomerular filtration rate between 30-59 mL/min/1.73 square meter 05/03/2014  . Coronary artery disease   . Diabetes (HCC) 05/03/2014  . Diabetes mellitus without complication (HCC)    type 1  . Hyperlipidemia   . Left shoulder pain   . 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/03/2014  . Shortness of breath   . Sleep apnea    mild OSA, no CPAP  . Unstable angina pectoris (HCC) 04/25/2014    Family History  Problem Relation Age of Onset  . Prostate cancer Father   . Heart disease Father   . Stomach cancer Mother   . Heart disease Brother   . Asthma Daughter   . Healthy Daughter     Past Surgical History:  Procedure Laterality Date  . CARDIAC CATHETERIZATION  04/25/2014   BY DR Jacinto Halim  . CARDIAC CATHETERIZATION N/A 10/30/2015   Procedure: Left Heart Cath and Cors/Grafts Angiography;  Surgeon: Yates Decamp, MD;  Location: [facility] INVASIVE CV LAB;  Service: Cardiovascular;  Laterality: N/A;  . CARPAL TUNNEL RELEASE    . CORONARY ARTERY BYPASS GRAFT N/A 04/27/2014   Procedure: CORONARY ARTERY BYPASS GRAFTING on pump using left internal mammary artery to LAD coronary artery, right great saphenous vein graft to diagonal coronary artery with sequential to OM1 and circumflex coronary arteries. Right greater saphenous vein graft to posterior descending coronary artery. ;  Surgeon: Delight Ovens, MD;  Location[facility];  Service: Open Heart Surgery;  Laterality: N  . elbow drained Left 05/09/2019  . ENDOVEIN HARVEST OF GREATER SAPHENOUS VEIN Right 04/27/2014   Procedure: ENDOVEIN HARVEST OF GREATER SAPHENOUS VEIN;  Surgeon: Delight Ovens, MD;  Location: [facility] OR;  Service: Open Heart Surgery;  Laterality: Right;  . EXCISION ORAL TUMOR N/A 03/01/2018   Procedure: EXCISION ORAL TUMOR;  Surgeon: Christia Reading, MD;  Location: [facility];  Service: ENT;  Laterality: N/A;  . EYE SURGERY     LASER  . INTRAOPERATIVE  TRANSESOPHAGEAL ECHOCARDIOGRAM N/A 04/27/2014   Procedure: INTRAOPERATIVE TRANSESOPHAGEAL ECHOCARDIOGRAM;  Surgeon: Delight Ovens, MD;  Location: [facility] OR;  Service: Open Heart Surgery;  Laterality: N/A;  . LEFT HEART CATHETERIZATION WITH CORONARY ANGIOGRAM N/A 04/25/2014   Procedure: LEFT HEART CATHETERIZATION WITH CORONARY ANGIOGRAM;  Surgeon: Pamella Pert, MD;  Location: [facility] CATH LAB;  Service: Cardiovascular;  Laterality: N/A;  . MOUTH SURGERY  11/15/2017   tongue surgery    Social History   Occupational History  . Not on file  Tobacco Use  . Smoking status: Current Every Day Smoker    Packs/day: 0.50    Years: 34.00    Pack years: 17.00    Types: Cigarettes  . Smokeless tobacco: Never  Used  . Tobacco comment: 10 per day   Substance and Sexual Activity  . Alcohol use: Yes    Alcohol/week: 0.0 standard drinks    Comment: RARE  . Drug use: No  . Sexual activity: Not on file

## 2019-09-21 NOTE — Progress Notes (Signed)
Office Visit Note  Patient: [name]             Date of Birth: [birth year]           MRN: [id number]             PCP: [name], MD Referring: [name], MD Visit Date: 09/26/2019 Occupation: @GUAROCC @  Subjective:  Pain in multiple joints   History of Present Illness: [name] is a 54 y.o. male with history of seropositive rheumatoid arthritis and osteoarthritis.  He is on Orencia 125 mg sq injections once weekly. He has not missed any doses of orencia recently. He has been experiencing severe arthralgias.  He has been experiencing nocturnal pain.  He is having pain in the left shoulder joint, both hip joints, left knee joint, and right ankle joint pain.  He has been experiencing intermittent swelling in the left knee joint. He is taking extra strength tylenol 1,000 mg daily and using voltaren gel topically for pain relief. He has been experiencing symptoms of neuropathy in both feet.  He describes it has an intense burning sensation bilaterally.  He states he has also developed a diabetic ulcer on the plantar aspect of the left foot.  He was evaluated by Dr. 53 and Dr. Jarold Motto, and he was prescribed doxycyline.  He purchased new shoes, which has helped significantly.    Activities of Daily Living:  Patient reports morning stiffness for  1 hour.   Patient Reports nocturnal pain.  Difficulty dressing/grooming: Denies Difficulty climbing stairs: Reports Difficulty getting out of chair: Reports Difficulty using hands for taps, buttons, cutlery, and/or writing: Denies  Review of Systems  Constitutional: Positive for fatigue. Negative for night sweats.  HENT: Positive for mouth dryness. Negative for mouth sores and nose dryness.   Eyes: Negative for redness and dryness.  Respiratory: Negative for cough, hemoptysis, shortness of breath and difficulty breathing.   Cardiovascular: Negative for chest pain, palpitations, hypertension, irregular heartbeat  and swelling in legs/feet.  Gastrointestinal: Negative for blood in stool, constipation and diarrhea.  Endocrine: Negative for increased urination.  Genitourinary: Negative for painful urination.  Musculoskeletal: Positive for arthralgias, joint pain and morning stiffness. Negative for joint swelling, myalgias, muscle weakness, muscle tenderness and myalgias.  Skin: Negative for color change, rash, hair loss, nodules/bumps, skin tightness, ulcers and sensitivity to sunlight.  Allergic/Immunologic: Negative for susceptible to infections.  Neurological: Negative for dizziness, fainting, memory loss, night sweats and weakness.  Hematological: Negative for swollen glands.  Psychiatric/Behavioral: Negative for depressed mood and sleep disturbance. The patient is not nervous/anxious.     PMFS History:  Patient Active Problem List   Diagnosis Date Noted  . Chronic venous insufficiency 10/05/2018  . Controlled type 1 diabetes mellitus with diabetic peripheral angiopathy without gangrene (HCC) 08/14/2017  . Dyslipidemia 03/26/2017  . High risk medication use 09/23/2016  . History of hypertension 09/23/2016  . History of chronic kidney disease 09/23/2016  . History of coronary artery disease 09/23/2016  . Tobacco abuse 09/23/2016  . Primary osteoarthritis of both knees 09/23/2016  . History of diabetes mellitus 09/23/2016  . 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 Chest pain with high risk for cardiac etiology 10/29/2015  . Asymptomatic bilateral carotid artery stenosis 06/08/2014  . Pleural effusion, bilateral 06/03/2014  . Dyspnea 06/01/2014  . Diabetes (HCC) 05/03/2014  . 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/03/2014  . 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 Chronic renal disease, stage 3, moderately decreased glomerular filtration  rate between 30-59 mL/min/1.73 square meter 05/03/2014  . CAD (coronary artery disease) 04/27/2014    Past Medical History:  Diagnosis Date  .  Anginal pain ([place])   . Arthritis    RA IN HANDS  . Asthma   . CAD (coronary artery disease) 04/27/2014  . Chronic renal disease, stage 3, moderately decreased glomerular filtration rate between 30-59 mL/min/1.73 square meter 05/03/2014  . Coronary artery disease   . Diabetes ([place]) 05/03/2014  . Diabetes mellitus without complication ([place])    type 1  . Hyperlipidemia   . Left shoulder pain   . Rheumatoid arthritis involving multiple joints ([place]) 05/03/2014  . Shortness of breath   . Sleep apnea    mild OSA, no CPAP  . Unstable angina pectoris ([place]) 04/25/2014    Family History  Problem Relation Age of Onset  . Prostate cancer Father   . Heart disease Father   . Stomach cancer Mother   . Heart disease Brother   . Asthma Daughter   . Healthy Daughter    Past Surgical History:  Procedure Laterality Date  . CARDIAC CATHETERIZATION  04/25/2014   BY DR Einar Gip  . CARDIAC CATHETERIZATION N/A 10/30/2015   Procedure: Left Heart Cath and Cors/Grafts Angiography;  Surgeon: Adrian Prows, MD;  Location: [place] CV LAB;  Service: Cardiovascular;  Laterality: N/A;  . CARPAL TUNNEL RELEASE    . CORONARY ARTERY BYPASS GRAFT N/A 04/27/2014   Procedure: CORONARY ARTERY BYPASS GRAFTING on pump using left internal mammary artery to LAD coronary artery, right great saphenous vein graft to diagonal coronary artery with sequential to OM1 and circumflex coronary arteries. Right greater saphenous vein graft to posterior descending coronary artery. ;  Surgeon: Grace Isaac, MD;  Location: [place];  Service: Open Heart Surgery;  Laterality: N  . elbow drained Left 05/09/2019  . ENDOVEIN HARVEST OF GREATER SAPHENOUS VEIN Right 04/27/2014   Procedure: ENDOVEIN HARVEST OF GREATER SAPHENOUS VEIN;  Surgeon: Grace Isaac, MD;  Location: [place];  Service: Open Heart Surgery;  Laterality: Right;  . EXCISION ORAL TUMOR N/A 03/01/2018   Procedure: EXCISION ORAL TUMOR;  Surgeon: Melida Quitter, MD;  Location: [place];  Service: ENT;  Laterality: N/A;  . EYE SURGERY     LASER  . INTRAOPERATIVE TRANSESOPHAGEAL ECHOCARDIOGRAM N/A 04/27/2014   Procedure: INTRAOPERATIVE TRANSESOPHAGEAL ECHOCARDIOGRAM;  Surgeon: Grace Isaac, MD;  Location: [place];  Service: Open Heart Surgery;  Laterality: N/A;  . LEFT HEART CATHETERIZATION WITH CORONARY ANGIOGRAM N/A 04/25/2014   Procedure: LEFT HEART CATHETERIZATION WITH CORONARY ANGIOGRAM;  Surgeon: Laverda Page, MD;  Location[facility] C[facility] LAB;  Service: Cardiovascular;  Laterality: N/A;  . MOUTH SURGERY  11/15/2017   tongue surgery    Social History   Social History Narrative  . Not on file    There is no immunization history on file for this patient.   Objective: Vital Signs: BP (!) 120/50 (BP Location: Left Arm, Patient Position: Sitting, Cuff Size: Normal)   Pulse 67   Resp 16   Ht 5\' 8"  (1.727 m)   Wt 195 lb (88.5 kg)   BMI 29.65 kg/m    Physical Exam Vitals and nursing note reviewed.  Constitutional:      Appearance: He is well-developed.  HENT:     Head: Normocephalic and atraumatic.  Eyes:     Conjunctiva/sclera: Conjunctivae normal.     Pupils: Pupils are equal, round, and reactive to light.  Pulmonary:  Effort: Pulmonary effort is normal.  Abdominal:     General: Bowel sounds are normal.     Palpations: Abdomen is soft.  Musculoskeletal:     Cervical back: Normal range of motion and neck supple.  Skin:    General: Skin is warm and dry.     Capillary Refill: Capillary refill takes less than 2 seconds.  Neurological:     Mental Status: He is alert and oriented to person, place, and time.  Psychiatric:        Behavior: Behavior normal.      Musculoskeletal Exam: C-spine, thoracic spine, and lumbar spine good ROM.  Shoulder joints, elbow joints, wrist joints, MCPs, PIPs, and DIPs good ROM with no synovitis.  Hip joints, knee joints, ankle joints, MTPs, PIPs, and DIPs good ROM with no synovitis.  No warmth or effusion  of knee joints.  No tenderness or inflammation of ankle joints.  Pes cavus noted.  No tenderness of MTP joints.  CDAI Exam: CDAI Score: -- Patient Global: --; Provider Global: -- Swollen: --; Tender: -- Joint Exam 09/26/2019   No joint exam has been documented for this visit   There is currently no information documented on the homunculus. Go to the Rheumatology activity and complete the homunculus joint exam.  Investigation: No additional findings.  Imaging: No results found.  Recent Labs: Lab Results  Component Value Date   WBC 10.0 06/22/2019   HGB 13.2 06/22/2019   PLT 260 06/22/2019   NA 136 06/22/2019   K 4.8 06/22/2019   CL 101 06/22/2019   CO2 27 06/22/2019   GLUCOSE 321 (H) 06/22/2019   BUN 10 06/22/2019   CREATININE 1.39 (H) 06/22/2019   BILITOT 0.3 06/22/2019   ALKPHOS 97 03/15/2018   AST 19 06/22/2019   ALT 20 06/22/2019   PROT 6.7 06/22/2019   ALBUMIN 3.7 03/15/2018   CALCIUM 8.9 06/22/2019   GFRAA 66 06/22/2019   QFTBGOLDPLUS NEGATIVE 03/21/2019    Speciality Comments: Prior therapy includes: Humira and Enbrel (inadequate response), MTX (GI upset and elevated creatinine), Arava (diarrhea). He is not a good candidate for Plaquenil due to diabetic retinopathy and CKD stage 3.  Procedures:  No procedures performed Allergies: Morphine and related   Assessment / Plan:     Visit Diagnoses: Rheumatoid arthritis involving multiple sites with positive rheumatoid factor (HCC) - He has no synovitis or tenderness on exam.  He has been experiencing severe arthralgias in multiple joints including the left shoulder, left knee, and right ankle joint.  He has no warmth or effusion of the left knee joint on exam today.  He has good range of motion with no discomfort in any of his joints.  He is on Orencia 125 mg of days injections once weekly.  According to the patient he had less arthralgias while on Enbrel.  We discussed that most of his discomfort is likely from  osteoarthritis.  We will check a sedimentation rate today.  We discussed that if his sed rate is elevated we will discuss other treatment options.  If his sed rate is within normal limits he will be starting on gabapentin 100 mg 1 capsule at bedtime for 1 week then increase to 200 mg at bedtime for 1 week then 300 mg at bedtime.  We will call him with his lab results.  He plans on continuing to take exercising Tylenol 1000 mg daily for pain relief and using Voltaren gel topically as needed.  We discussed the importance of avoiding  NSAIDs due to underlying chronic kidney disease.  He will continue on Orencia 125 mg IV injections once weekly.  He was advised to notify us if he continues to have recurrent flares.  He will follow-up in the office in 3 months.  Plan: Sedimentation rate  Rheumatoid nodulosis (HCC) - Resolved.  High risk medication use - Orencia 125 mg sq weekly injections (started in September 2020). Inadequate response to Enbrel, Humira, D/c MTX, Arava-SE, PLQ-retinopathy. CBC and CMP were drawn on 06/22/19. CBC and CMP were drawn today to monitor for drug toxicity.  He will return for lab work in May and every 3 months.  TB gold negative on 03/21/19.- Plan: COMPLETE METABOLIC PANEL WITH GFR, CBC with Differential/Platelet  Septic olecranon bursitis of left elbow - Resolved.   Primary osteoarthritis of both knees: He has good ROM of both knee joints.  No warmth or effusion noted.  He has been experiencing intermittent discomfort and joint swelling in the left knee joint.  He has difficulty climbing steps and getting up from a chair.    Closed nondisplaced fracture of fifth metatarsal bone of left foot with routine healing, subsequent encounter: 08/15/19 X-rays consistent with a nonunion.  He continues to follow up with Dr. Magnus Ivan.    Osteopenia of multiple sites - DEXA 06/09/2019 T score -1.9 right femoral neck. He is taking a vitamin D supplement.   Other medical conditions are listed as  follows:   History of coronary artery disease  History of chronic kidney disease  History of diabetes mellitus  History of hypertension  Dyslipidemia  Tobacco abuse  Orders: Orders Placed This Encounter  Procedures  . COMPLETE METABOLIC PANEL WITH GFR  . CBC with Differential/Platelet  . Sedimentation rate   Meds ordered this encounter  Medications  . gabapentin (NEURONTIN) 100 MG capsule    Sig: Take 1 capsule po at bedtime for one week,then increase to 2 capsules po at bedtime for one week, then increase to 3 capsules po at bedtime.    Dispense:  63 capsule    Refill:  0    Face-to-face time spent with patient was 30 minutes. Greater than 50% of time was spent in counseling and coordination of care.  Follow-Up Instructions: Return in about 3 months (around 12/24/2019) for Rheumatoid arthritis, Osteoarthritis.   Sherron Ales, PA-C  I examined and evaluated the patient with Sherron Ales PA.  Patient complains of increased arthralgias.  No synovitis was noted on the examination.  He is currently on Wellbutrin.  Cymbalta could be another option which she can discuss with his PCP.  He has been also experiencing some discomfort in his bilateral feet.  We discussed the option of adding gabapentin.  Side effects were discussed.  He appears to be having symptoms of neuropathy.  The plan of care was discussed as noted above.  Pollyann Savoy, MD  Note - This record has been created using Animal nutritionist.  Chart creation errors have been sought, but may not always  have been located. Such creation errors do not reflect on  the standard of medical care.

## 2019-09-26 ENCOUNTER — Ambulatory Visit: Payer: 59 | Admitting: Rheumatology

## 2019-09-26 ENCOUNTER — Encounter: Payer: Self-pay | Admitting: Physician Assistant

## 2019-09-26 ENCOUNTER — Other Ambulatory Visit: Payer: Self-pay

## 2019-09-26 VITALS — BP 120/50 | HR 67 | Resp 16 | Ht 68.0 in | Wt 195.0 lb

## 2019-09-26 DIAGNOSIS — M71122 Other infective bursitis, left elbow: Secondary | ICD-10-CM

## 2019-09-26 DIAGNOSIS — M170 Bilateral primary osteoarthritis of knee: Secondary | ICD-10-CM

## 2019-09-26 DIAGNOSIS — Z87448 Personal history of other diseases of urinary system: Secondary | ICD-10-CM

## 2019-09-26 DIAGNOSIS — Z8679 Personal history of other diseases of the circulatory system: Secondary | ICD-10-CM

## 2019-09-26 DIAGNOSIS — M0579 Rheumatoid arthritis with rheumatoid factor of multiple sites without organ or systems involvement: Secondary | ICD-10-CM | POA: Diagnosis not present

## 2019-09-26 DIAGNOSIS — Z720 Tobacco use: Secondary | ICD-10-CM

## 2019-09-26 DIAGNOSIS — S92355D Nondisplaced fracture of fifth metatarsal bone, left foot, subsequent encounter for fracture with routine healing: Secondary | ICD-10-CM

## 2019-09-26 DIAGNOSIS — M8589 Other specified disorders of bone density and structure, multiple sites: Secondary | ICD-10-CM

## 2019-09-26 DIAGNOSIS — E785 Hyperlipidemia, unspecified: Secondary | ICD-10-CM

## 2019-09-26 DIAGNOSIS — Z79899 Other long term (current) drug therapy: Secondary | ICD-10-CM

## 2019-09-26 DIAGNOSIS — Z8639 Personal history of other endocrine, nutritional and metabolic disease: Secondary | ICD-10-CM

## 2019-09-26 DIAGNOSIS — M0630 Rheumatoid nodule, unspecified site: Secondary | ICD-10-CM

## 2019-09-26 LAB — COMPLETE METABOLIC PANEL WITH GFR
AG Ratio: 1.7 (calc) (ref 1.0–2.5)
ALT: 29 U/L (ref 9–46)
AST: 21 U/L (ref 10–35)
Albumin: 4 g/dL (ref 3.6–5.1)
Alkaline phosphatase (APISO): 102 U/L (ref 35–144)
BUN: 13 mg/dL (ref 7–25)
CO2: 28 mmol/L (ref 20–32)
Calcium: 9.4 mg/dL (ref 8.6–10.3)
Chloride: 100 mmol/L (ref 98–110)
Creat: 1.3 mg/dL (ref 0.70–1.33)
GFR, Est African American: 71 mL/min/{1.73_m2} (ref >=60)
GFR, Est Non African American: 61 mL/min/{1.73_m2} (ref >=60)
Globulin: 2.4 g/dL (calc) (ref 1.9–3.7)
Glucose, Bld: 279 mg/dL — ABNORMAL HIGH (ref 65–99)
Potassium: 4.5 mmol/L (ref 3.5–5.3)
Sodium: 137 mmol/L (ref 135–146)
Total Bilirubin: 0.4 mg/dL (ref 0.2–1.2)
Total Protein: 6.4 g/dL (ref 6.1–8.1)

## 2019-09-26 LAB — CBC WITH DIFFERENTIAL/PLATELET
Absolute Monocytes: 614 cells/uL (ref 200–950)
Basophils Absolute: 125 cells/uL (ref 0–200)
Basophils Relative: 1.3 %
Eosinophils Absolute: 518 cells/uL — ABNORMAL HIGH (ref 15–500)
Eosinophils Relative: 5.4 %
HCT: 41.4 % (ref 38.5–50.0)
Hemoglobin: 13.7 g/dL (ref 13.2–17.1)
Lymphs Abs: 2957 cells/uL (ref 850–3900)
MCH: 30.7 pg (ref 27.0–33.0)
MCHC: 33.1 g/dL (ref 32.0–36.0)
MCV: 92.8 fL (ref 80.0–100.0)
MPV: 10.8 fL (ref 7.5–12.5)
Monocytes Relative: 6.4 %
Neutro Abs: 5386 cells/uL (ref 1500–7800)
Neutrophils Relative %: 56.1 %
Platelets: 270 10*3/uL (ref 140–400)
RBC: 4.46 10*6/uL (ref 4.20–5.80)
RDW: 13.1 % (ref 11.0–15.0)
Total Lymphocyte: 30.8 %
WBC: 9.6 10*3/uL (ref 3.8–10.8)

## 2019-09-26 LAB — SEDIMENTATION RATE: Sed Rate: 9 mm/h (ref 0–20)

## 2019-09-26 MED ORDER — GABAPENTIN 100 MG PO CAPS: ORAL_CAPSULE | ORAL | 0 refills | Status: DC

## 2019-09-27 NOTE — Progress Notes (Signed)
Glucose is elevated-279.  Rest of CMP WNL.  CBC stable. ESR WNL.   Please advise patient to continue taking Orencia as prescribed.  He can start taking gabapentin as discussed.

## 2019-10-03 ENCOUNTER — Other Ambulatory Visit: Payer: Self-pay | Admitting: Cardiology

## 2019-10-06 ENCOUNTER — Telehealth (HOSPITAL_COMMUNITY): Payer: Self-pay

## 2019-10-06 NOTE — Telephone Encounter (Signed)

## 2019-10-11 ENCOUNTER — Ambulatory Visit (HOSPITAL_COMMUNITY)
Admission: RE | Admit: 2019-10-11 | Discharge: 2019-10-11 | Disposition: A | Discharge status: Home / Self Care | Payer: 59 | Source: Ambulatory Visit | Attending: Vascular Surgery | Admitting: Vascular Surgery

## 2019-10-11 ENCOUNTER — Other Ambulatory Visit: Payer: Self-pay

## 2019-10-11 ENCOUNTER — Ambulatory Visit (INDEPENDENT_AMBULATORY_CARE_PROVIDER_SITE_OTHER): Payer: 59 | Admitting: Physician Assistant

## 2019-10-11 ENCOUNTER — Encounter: Payer: Self-pay | Admitting: Vascular Surgery

## 2019-10-11 VITALS — BP 111/61 | HR 64 | Temp 97.8°F | Resp 20 | Ht 68.0 in | Wt 190.0 lb

## 2019-10-11 DIAGNOSIS — I739 Peripheral vascular disease, unspecified: Secondary | ICD-10-CM

## 2019-10-11 DIAGNOSIS — I872 Venous insufficiency (chronic) (peripheral): Secondary | ICD-10-CM | POA: Diagnosis not present

## 2019-10-11 NOTE — Progress Notes (Signed)
Patient name: [name] MRN: [id number] DOB: [birth year] Sex: male  REASON FOR VISIT: 1 year follow-up with ABIs   HPI: [name] is a 54 y.o. male with history of coronary artery disease status post CABG, diabetes that presents for a one-year follow-up for ongoing surveillance of mild PAD and deep venous insufficiency.  Patient was previously followed by Dr. Bridgett Larsson and has been wearing bilateral lower extremity compression for deep reflux noted on referring hospital imaging.  He is also being followed for mild PAD.  Since last office visit he fractured his left fifth metatarsal which required a walking boot.  The walking boot started a pressure sore at the base of his great toe on the plantar surface of his foot however this is now healed.  He has also been started on gabapentin by his primary care doctor which is helped neuropathy.  He he denies any claudication, rest pain, or any active tissue ischemia.  He is on aspirin and statin daily.  Past medical history also significant for diabetes mellitus with his last hemoglobin A1c at 6.9.  Past Medical History:  Diagnosis Date  . Anginal pain ([place])   . Arthritis    RA IN HANDS  . Asthma   . CAD (coronary artery disease) 04/27/2014  . Chronic renal disease, stage 3, moderately decreased glomerular filtration rate between 30-59 mL/min/1.73 square meter 05/03/2014  . Coronary artery disease   . Diabetes ([place]) 05/03/2014  . Diabetes mellitus without complication ([place])    type 1  . Hyperlipidemia   . Left shoulder pain   . Rheumatoid arthritis involving multiple joints ([place]) 05/03/2014  . Shortness of breath   . Sleep apnea    mild OSA, no CPAP  . Unstable angina pectoris ([place]) 04/25/2014    Past Surgical History:  Procedure Laterality Date  . CARDIAC CATHETERIZATION  04/25/2014   BY DR Einar Gip  . CARDIAC CATHETERIZATION N/A 10/30/2015   Procedure: Left Heart Cath and Cors/Grafts Angiography;  Surgeon: Adrian Prows, MD;  Location: [facility];  Service: Cardiovascular;  Laterality: N/A;  . CARPAL TUNNEL RELEASE    . CORONARY ARTERY BYPASS GRAFT N/A 04/27/2014   Procedure: CORONARY ARTERY BYPASS GRAFTING on pump using left internal mammary artery to LAD coronary artery, right great saphenous vein graft to diagonal coronary artery with sequential to OM1 and circumflex coronary arteries. Right greater saphenous vein graft to posterior descending coronary artery. ;  Surgeon: Grace Isaac, MD;  Location: [facility];  Service: Open Heart Surgery;  Laterality: N  . elbow drained Left 05/09/2019  . ENDOVEIN HARVEST OF GREATER SAPHENOUS VEIN Right 04/27/2014   Procedure: ENDOVEIN HARVEST OF GREATER SAPHENOUS VEIN;  Surgeon: Grace Isaac, MD;  Location: [facility];  Service: Open Heart Surgery;  Laterality: Right;  . EXCISION ORAL TUMOR N/A 03/01/2018   Procedure: EXCISION ORAL TUMOR;  Surgeon: Melida Quitter, MD;  Location: [facility];  Service: ENT;  Laterality: N/A;  . EYE SURGERY     LASER  . INTRAOPERATIVE TRANSESOPHAGEAL ECHOCARDIOGRAM N/A 04/27/2014   Procedure: INTRAOPERATIVE TRANSESOPHAGEAL ECHOCARDIOGRAM;  Surgeon: Grace Isaac, MD;  Location: [facility];  Service: Open Heart Surgery;  Laterality: N/A;  . LEFT HEART CATHETERIZATION WITH CORONARY ANGIOGRAM N/A 04/25/2014   Procedure: LEFT HEART CATHETERIZATION WITH CORONARY ANGIOGRAM;  Surgeon: Laverda Page, MD;  Location: [facility];  Service: Cardiovascular;  Laterality: N/A;  . MOUTH SURGERY  11/15/2017   tongue surgery  Family History  Problem Relation Age of Onset  . Prostate cancer Father   . Heart disease Father   . Stomach cancer Mother   . Heart disease Brother   . Asthma Daughter   . Healthy Daughter     SOCIAL HISTORY: Social History   Tobacco Use  . Smoking status: Current Every Day Smoker    Packs/day: 0.50    Years: 34.00    Pack years: 17.00    Types: Cigarettes  . Smokeless tobacco: Never Used  Substance Use Topics    . Alcohol use: Yes    Alcohol/week: 0.0 standard drinks    Comment: RARE    Allergies  Allergen Reactions  . Morphine And Related Nausea And Vomiting    Current Outpatient Medications  Medication Sig Dispense Refill  . amLODipine (NORVASC) 5 MG tablet Take 1 tablet (5 mg total) by mouth daily. 90 tablet 2  . Apoaequorin (PREVAGEN PO) Take by mouth.    . Ascorbic Acid (VITAMIN C) 1000 MG tablet Take 1,000 mg by mouth daily.    Marland Kitchen aspirin 81 MG tablet Take 81 mg by mouth daily.    Marland Kitchen buPROPion (WELLBUTRIN SR) 150 MG 12 hr tablet Take 1 tablet by mouth once daily 90 tablet 0  . Cholecalciferol (VITAMIN D3) 1.25 MG (50000 UT) CAPS Take by mouth.    . Cod Liver Oil 5000-500 UNIT/5ML OIL Take by mouth.    . Continuous Blood Gluc Sensor (FREESTYLE LIBRE 14 DAY SENSOR) MISC CHANGE EVERY 14 DAYS TO MONITOR BLOOD GLUCOSE    . Continuous Blood Gluc Sensor (FREESTYLE LIBRE 14 DAY SENSOR) MISC CHANGE EVERY 14 DAYS TO MONITOR BLOOD GLUOSE    . Cyanocobalamin (VITAMIN B-12 PO) Take by mouth daily.    Marland Kitchen gabapentin (NEURONTIN) 100 MG capsule Take 1 capsule po at bedtime for one week,then increase to 2 capsules po at bedtime for one week, then increase to 3 capsules po at bedtime. 63 capsule 0  . glucose blood (ACCU-CHEK AVIVA PLUS) test strip     . insulin glargine (LANTUS) 100 UNIT/ML injection     . lidocaine (LMX) 4 % cream Apply 1 application topically as needed.    . magnesium gluconate (MAGONATE) 500 MG tablet Take 500 mg by mouth daily.     . nitroGLYCERIN (NITROSTAT) 0.4 MG SL tablet Place 1 tablet (0.4 mg total) under the tongue every 5 (five) minutes as needed for chest pain. 25 tablet 3  . ORENCIA CLICKJECT 125 MG/ML SOAJ INJECT 125MG  SUBCUTANEOUSLY WEEKLY 4 mL 0  . tadalafil (CIALIS) 20 MG tablet Take 20 mg by mouth daily as needed.    . Zinc 30 MG CAPS Take 1 capsule (30 mg total) by mouth daily. 30 capsule 3  . doxycycline (VIBRA-TABS) 100 MG tablet Take 100 mg by mouth 2 (two) times  daily.     No current facility-administered medications for this visit.    REVIEW OF SYSTEMS:  [X]  denotes positive finding, [ ]  denotes negative finding Cardiac  Comments:  Chest pain or chest pressure:    Shortness of breath upon exertion:    Short of breath when lying flat:    Irregular heart rhythm:        Vascular    Pain in calf, thigh, or hip brought on by ambulation:    Pain in feet at night that wakes you up from your sleep:     Blood clot in your veins:    Leg swelling:  Pulmonary    Oxygen at home:    Productive cough:     Wheezing:         Neurologic    Sudden weakness in arms or legs:     Sudden numbness in arms or legs:     Sudden onset of difficulty speaking or slurred speech:    Temporary loss of vision in one eye:     Problems with dizziness:         Gastrointestinal    Blood in stool:     Vomited blood:         Genitourinary    Burning when urinating:     Blood in urine:        Psychiatric    Major depression:         Hematologic    Bleeding problems:    Problems with blood clotting too easily:        Skin    Rashes or ulcers:        Constitutional    Fever or chills:      PHYSICAL EXAM: Vitals:   10/11/19 1603  BP: 111/61  Pulse: 64  Resp: 20  Temp: 97.8 F (36.6 C)  SpO2: 97%  Weight: 190 lb (86.2 kg)  Height: 5\' 8"  (1.727 m)    GENERAL: The patient is a well-nourished male, in no acute distress. The vital signs are documented above. CARDIAC: There is a regular rate and rhythm.  VASCULAR:  2+ femoral pulses palpable bilaterally 2+ DP palpable bilaterally No tissue loss No varicosities PULMONARY: There is good air exchange bilaterally without wheezing or rales. ABDOMEN: Soft and non-tender with normal pitched bowel sounds.  MUSCULOSKELETAL: There are no major deformities or cyanosis. NEUROLOGIC: No focal weakness or paresthesias are detected.   DATA:   ABIs noncompressible but triphasic waveforms at the  ankle  Assessment/Plan:  -Right ABI not obtained due to noncompressible tibial vessels and left ABI within normal limits;  Patient however has palpable bilateral DP pulses; no indication for intervention at this time -Continue compression as needed for deep venous reflux -Continue aspirin and statin daily -Recheck ABIs in 1 year -Patient will call/return office sooner if any claudication, rest pain, or nonhealing wounds develop   , PA-C [facility] Office: [phone]

## 2019-10-12 ENCOUNTER — Other Ambulatory Visit: Payer: Self-pay | Admitting: *Deleted

## 2019-10-12 DIAGNOSIS — I739 Peripheral vascular disease, unspecified: Secondary | ICD-10-CM

## 2019-10-19 ENCOUNTER — Other Ambulatory Visit: Payer: Self-pay

## 2019-10-19 ENCOUNTER — Telehealth: Payer: Self-pay

## 2019-10-19 NOTE — Telephone Encounter (Signed)
He was on metoprolol but discontinued due to ED. So we can try to do PA

## 2019-10-19 NOTE — Telephone Encounter (Signed)
Patient called and stated he tried to refill his rx of Bystolic; however his insurance no longer covers it. Is that an alternative that the patient can take? Please advise. Thanks!

## 2019-10-20 ENCOUNTER — Other Ambulatory Visit: Payer: Self-pay | Admitting: Rheumatology

## 2019-10-20 NOTE — Telephone Encounter (Signed)
Last Visit: 09/26/19 Next visit: 12/19/19 Labs: 09/26/19 Glucose is elevated-279. Rest of CMP WNL. CBC stable. Tb Gold: 03/21/19 Neg   Okay to refill per Dr. Corliss Skains

## 2019-10-21 ENCOUNTER — Telehealth: Payer: Self-pay

## 2019-10-21 NOTE — Telephone Encounter (Signed)
Pt Bystolic 5mg  was not approved please advise if you would like to try another medication.

## 2019-10-23 ENCOUNTER — Other Ambulatory Visit: Payer: Self-pay | Admitting: Physician Assistant

## 2019-10-24 NOTE — Telephone Encounter (Signed)
Last Visit: 09/26/19 Next visit: 12/19/19  Patient is starting gabapentin and prescription to titrate has been sent. Patients dose will be 300mg  at bedtime. Please refill gabapentin 300mg  capsules at next refill, per .   Okay to refill per Dr. 

## 2019-10-25 ENCOUNTER — Encounter (HOSPITAL_COMMUNITY): Payer: 59

## 2019-10-25 ENCOUNTER — Ambulatory Visit: Payer: 59 | Admitting: Vascular Surgery

## 2019-11-01 ENCOUNTER — Telehealth: Payer: Self-pay | Admitting: Rheumatology

## 2019-11-01 NOTE — Telephone Encounter (Signed)
Called [employer], patient's [place] is still active and valid through 08/2020. Called [employer] [facility], rep was able to get a paid claim for 28 days supply for $5.00 copay. Called patient and provided phone number. Advised to return call if he has any issues.  10:35 AM Dorthula Nettles, CPhT

## 2019-11-01 NOTE — Telephone Encounter (Signed)
Patient called stating he received a letter from [facility] letting him know Dub Amis is no longer a covered medication.  Patient states he tried to use his discount card and stated it was denied.  Patient is requesting a return call.

## 2019-11-07 ENCOUNTER — Ambulatory Visit (INDEPENDENT_AMBULATORY_CARE_PROVIDER_SITE_OTHER): Payer: 59 | Admitting: Cardiology

## 2019-11-07 ENCOUNTER — Encounter: Payer: Self-pay | Admitting: Cardiology

## 2019-11-07 ENCOUNTER — Other Ambulatory Visit: Payer: Self-pay

## 2019-11-07 VITALS — BP 127/58 | HR 62 | Temp 98.9°F | Resp 16 | Ht 68.0 in | Wt 197.0 lb

## 2019-11-07 DIAGNOSIS — I10 Essential (primary) hypertension: Secondary | ICD-10-CM

## 2019-11-07 DIAGNOSIS — Z720 Tobacco use: Secondary | ICD-10-CM

## 2019-11-07 DIAGNOSIS — I739 Peripheral vascular disease, unspecified: Secondary | ICD-10-CM

## 2019-11-07 DIAGNOSIS — E7800 Pure hypercholesterolemia, unspecified: Secondary | ICD-10-CM

## 2019-11-07 DIAGNOSIS — I2510 Atherosclerotic heart disease of native coronary artery without angina pectoris: Secondary | ICD-10-CM

## 2019-11-07 DIAGNOSIS — I6523 Occlusion and stenosis of bilateral carotid arteries: Secondary | ICD-10-CM

## 2019-11-07 MED ORDER — BISOPROLOL FUMARATE 5 MG PO TABS: 5.0000 mg | ORAL_TABLET | Freq: Every day | ORAL | 2 refills | Status: DC

## 2019-11-07 NOTE — Progress Notes (Signed)
Primary Physician:  [name], MD  Patient ID: [name], male    DOB: February 06, 1966, 54 y.o.   MRN: [id number]  Subjective:   Chief Complaint  Patient presents with  . Hyperlipidemia  . Coronary Artery Disease  . Follow-up    1 year   HPI: [name]  is a 54 y.o. male  with history of Type I Diabetes, rheumatoid arthritis, hyperlipidemia, and CAD S/P coronary artery bypass grafting on 04/27/2014.  He had sustained left foot fracture and also developed left elbow bursitis which later led to infection in Oct 2020. Bursitis has now resolved. Left foot pain is improving.  He had bilateral cataract surgery in March 2021. He was previously able to significantly cut back on tobacco use with the use of Wellbutrin; however, had recently resumed. He reports over the last week cutting back to 5 cigarettes per day with nicotine patches and nicorette gum. He has been making lifestyle changes with diet.   Past Medical History:  Diagnosis Date  . Anginal pain ([facility])   . Arthritis    RA IN HANDS  . Asthma   . CAD (coronary artery disease) 04/27/2014  . Chronic renal disease, stage 3, moderately decreased glomerular filtration rate between 30-59 mL/min/1.73 square meter 05/03/2014  . Coronary artery disease   . Diabetes ([facility]) 05/03/2014  . Diabetes mellitus without complication ([facility])    type 1  . Hyperlipidemia   . Left shoulder pain   . 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/03/2014  . Shortness of breath   . Sleep apnea    mild OSA, no CPAP  . Unstable angina pectoris ([facility]) 04/25/2014    Past Surgical History:  Procedure Laterality Date  . CARDIAC CATHETERIZATION  04/25/2014   BY DR Jacinto Halim  . CARDIAC CATHETERIZATION N/A 10/30/2015   Procedure: Left Heart Cath and Cors/Grafts Angiography;  Surgeon: Yates Decamp, MD;  Location: [facility];  Service: Cardiovascular;  Laterality: N/A;  . CARPAL TUNNEL RELEASE    . CORONARY ARTERY BYPASS GRAFT N/A 04/27/2014    Procedure: CORONARY ARTERY BYPASS GRAFTING on pump using left internal mammary artery to LAD coronary artery, right great saphenous vein graft to diagonal coronary artery with sequential to OM1 and circumflex coronary arteries. Right greater saphenous vein graft to posterior descending coronary artery. ;  Surgeon: Delight Ovens, MD;  Location: [facility];  Service: Open Heart Surgery;  Laterality: N  . elbow drained Left 05/09/2019  . ENDOVEIN HARVEST OF GREATER SAPHENOUS VEIN Right 04/27/2014   Procedure: ENDOVEIN HARVEST OF GREATER SAPHENOUS VEIN;  Surgeon: Delight Ovens, MD;  Location: [facility];  Service: Open Heart Surgery;  Laterality: Right;  . EXCISION ORAL TUMOR N/A 03/01/2018   Procedure: EXCISION ORAL TUMOR;  Surgeon: Christia Reading, MD;  Location: [facility];  Service: ENT;  Laterality: N/A;  . EYE SURGERY     LASER  . INTRAOPERATIVE TRANSESOPHAGEAL ECHOCARDIOGRAM N/A 04/27/2014   Procedure: INTRAOPERATIVE TRANSESOPHAGEAL ECHOCARDIOGRAM;  Surgeon: Delight Ovens, MD;  Location: [facility] OR;  Service: Open Heart Surgery;  Laterality: N/A;  . LEFT HEART CATHETERIZATION WITH CORONARY ANGIOGRAM N/A 04/25/2014   Procedure: LEFT HEART CATHETERIZATION WITH CORONARY ANGIOGRAM;  Surgeon: Pamella Pert, MD;  Location: [facility] CATH LAB;  Service: Cardiovascular;  Laterality: N/A;  . MOUTH SURGERY  11/15/2017   tongue surgery    Social History   Tobacco Use  . Smoking status: Current Every Day Smoker    Packs/day: 0.50  Years: 34.00    Pack years: 17.00    Types: Cigarettes  . Smokeless tobacco: Never Used  Substance Use Topics  . Alcohol use: Yes    Alcohol/week: 0.0 standard drinks    Comment: RARE    Marital status: Divorced   Review of Systems  Cardiovascular: Positive for dyspnea on exertion. Negative for chest pain and leg swelling.  Musculoskeletal: Positive for arthritis and back pain.  Gastrointestinal: Negative for melena.   Objective:  Blood pressure (!)  127/58, pulse 62, temperature 98.9 F (37.2 C), temperature source Temporal, resp. rate 16, height 5\' 8"  (1.727 m), weight 197 lb (89.4 kg), SpO2 98 %. Body mass index is 29.95 kg/m.   Vitals with BMI 11/07/2019 10/11/2019 09/26/2019  Height 5\' 8"  5\' 8"  5\' 8"   Weight 197 lbs 190 lbs 195 lbs  BMI 29.96 28.9 29.66  Systolic 127 111 09/28/2019  Diastolic 58 61 50  Pulse 62 64 67    Physical Exam  Constitutional: Vital signs are normal. He appears well-developed and well-nourished.  Cardiovascular: Normal rate, regular rhythm, normal heart sounds and intact distal pulses.  Pulses:      Carotid pulses are on the right side with bruit and on the left side with bruit.      Femoral pulses are 2+ on the right side and 2+ on the left side.      Popliteal pulses are 2+ on the right side and 2+ on the left side.       Dorsalis pedis pulses are 1+ on the right side and 2+ on the left side.       Posterior tibial pulses are 1+ on the right side and 1+ on the left side.  Pulmonary/Chest: Effort normal and breath sounds normal. No accessory muscle usage. No respiratory distress. He exhibits no tenderness.  Abdominal: Soft. Bowel sounds are normal.  Vitals reviewed.  Radiology: No results found.  Laboratory examination:    CMP Latest Ref Rng & Units 09/26/2019 06/22/2019 03/21/2019  Glucose 65 - 99 mg/dL 829) 09/28/2019) 06/24/2019)  BUN 7 - 25 mg/dL 13 10 12   Creatinine 0.70 - 1.33 mg/dL 03/23/2019 937(J) 696(V)  Sodium 135 - 146 mmol/L 137 136 138  Potassium 3.5 - 5.3 mmol/L 4.5 4.8 4.7  Chloride 98 - 110 mmol/L 100 101 103  CO2 20 - 32 mmol/L 28 27 28   Calcium 8.6 - 10.3 mg/dL 9.4 8.9 9.2  Total Protein 6.1 - 8.1 g/dL 6.4 6.7 6.8  Total Bilirubin 0.2 - 1.2 mg/dL 0.4 0.3 0.4  Alkaline Phos 38 - 126 U/L - - -  AST 10 - 35 U/L 21 19 20   ALT 9 - 46 U/L 29 20 21    CBC Latest Ref Rng & Units 09/26/2019 06/22/2019 03/21/2019  WBC 3.8 - 10.8 Thousand/uL 9.6 10.0 11.3(H)  Hemoglobin 13.2 - 17.1 g/dL 7.51(W 2.58(N   Hematocrit 38.5 - 50.0 % 41.4 40.7 41.4  Platelets 140 - 400 Thousand/uL 270 260 231   Lipid Panel     Component Value Date/Time   CHOL 55 (L) 02/10/2019 0907   TRIG 43 02/10/2019 0907   HDL 29 (L) 02/10/2019 0907   CHOLHDL 1.9 02/10/2019 0907   LDLCALC 17 02/10/2019 0907   HEMOGLOBIN A1C Lab Results  Component Value Date   HGBA1C 7.4 (H) 04/25/2014   MPG 166 (H) 04/25/2014   TSH No results for input(s): TSH in the last 8760 hours.  PRN Meds:. Medications Discontinued During This Encounter  Medication  Reason  . doxycycline (VIBRA-TABS) 100 MG tablet Completed Course  . nebivolol (BYSTOLIC) 5 MG tablet Cost of medication   Current Meds  Medication Sig  . amLODipine (NORVASC) 5 MG tablet Take 1 tablet (5 mg total) by mouth daily.  Marland Kitchen Apoaequorin (PREVAGEN PO) Take by mouth.  . Ascorbic Acid (VITAMIN C) 1000 MG tablet Take 1,000 mg by mouth daily.  Marland Kitchen aspirin 81 MG tablet Take 81 mg by mouth daily.  Marland Kitchen buPROPion (WELLBUTRIN SR) 150 MG 12 hr tablet Take 1 tablet by mouth once daily  . Cholecalciferol (VITAMIN D3) 1.25 MG (50000 UT) CAPS Take by mouth.  . Cod Liver Oil 5000-500 UNIT/5ML OIL Take by mouth.  . Continuous Blood Gluc Sensor (FREESTYLE LIBRE 14 DAY SENSOR) MISC CHANGE EVERY 14 DAYS TO MONITOR BLOOD GLUCOSE  . Continuous Blood Gluc Sensor (FREESTYLE LIBRE 14 DAY SENSOR) MISC CHANGE EVERY 14 DAYS TO MONITOR BLOOD GLUOSE  . Cyanocobalamin (VITAMIN B-12 PO) Take by mouth daily.  Marland Kitchen gabapentin (NEURONTIN) 300 MG capsule Take 1 capsule (300 mg total) by mouth at bedtime.  Marland Kitchen glucose blood (ACCU-CHEK AVIVA PLUS) test strip   . insulin glargine (LANTUS) 100 UNIT/ML injection   . lidocaine (LMX) 4 % cream Apply 1 application topically as needed.  . magnesium gluconate (MAGONATE) 500 MG tablet Take 500 mg by mouth daily.   . nitroGLYCERIN (NITROSTAT) 0.4 MG SL tablet Place 1 tablet (0.4 mg total) under the tongue every 5 (five) minutes as needed for chest pain.  Marland Kitchen ORENCIA  CLICKJECT 518 MG/ML SOAJ INJECT 125MG  SUBCUTANEOUSLY WEEKLY  . Zinc 30 MG CAPS Take 1 capsule (30 mg total) by mouth daily.  . [DISCONTINUED] nebivolol (BYSTOLIC) 5 MG tablet Take by mouth.    Cardiac Studies:   CABG 04/27/2014: LIMA to LAD, SVG to D1, SVG to OM1 and distal circumflex, SVG to PDA. Dr. Ceasar Mons.  Echocardiogram 06/08/2014: - Left ventricle cavity is normal in size. Moderate concentric hypertrophy of the left ventricle. Normal global wall motion. Visual EF is 60-65%. Calculated EF 56%. - Trileaflet aortic valve with no regurgitation noted. Mild calcification of the aortic valve annulus. - Mild mitral regurgitation. Mild calcification of the mitral valve annulus. - Mild tricuspid regurgitation. No evidence of pulmonary hypertension.  Coronary angiogram 10/30/2015: Normal LVEF, 55%. Mid LAD occluded after D1. LIMA to LAD, SVG to D1 patent. Circumflex occluded in the midsegment after OM1. SVG to OM2 patent, sequential branch to his to circumflex occluded. Distal circumflex filled by collaterals. Mid RCA 90%, distal RCA 90%. SVG to RCA patent.  Lexiscan sestamibi stress test 10/12/2015: 1. The resting electrocardiogram demonstrated normal sinus rhythm, normal resting conduction, no resting arrhythmias and T changes. Stress EKG is non-diagnostic for ischemia as it a pharmacologic stress using Lexiscan. Stress test converted to Lexiscan due to exerise intolerence and dyspnea. 2. The perfusion imaging study demonstrates a medium size severe ischemia in the inferior and inferolateral wall extending from the base towards the apex. Left ventricular systolic function was normal with ejection fraction of 69%. This is a high risk study, consider further cardiac work-up.  Carotid artery duplex 07/07/2018: Stenosis in the bilateral internal carotid artery (16-49%). with heteregenous plaque. Antegrade vertebral artery flow. No significant change from 07/16/2017. Follow up in one year is  appropriate if clinically indicated.  Lexiscan Tetrofosmin Stress Test  07/20/2019: Nondiagnostic ECG stress. There is a small sized mild reversible mild defect in the lateral region.  Compared to the study done on 10/12/2015, large  inferolateral ischemia not present. Defect is consistent with known occluded distal circumflex coronary artery. Stress LV EF: 62%.  Intermediate risk study.   ABI 10/11/2019: Right: Resting right ankle-brachial index indicates noncompressible right  lower extremity arteries. The right toe-brachial index is abnormal. RT  great toe pressure = 83 mmHg.  Left: Resting left ankle-brachial index is within normal range. The left  toe-brachial index is abnormal. LT Great toe pressure = 70 mmHg.   EKG:  EKG 06/15/2019: Normal sinus rhythm at 61 bpm, normal axis, diffuse nonspecific T wave abnormality. No changes compared to previous EKG on 05/19/2019.  Assessment:     ICD-10-CM   1. Coronary artery disease involving native coronary artery of native heart without angina pectoris  I25.10 bisoprolol (ZEBETA) 5 MG tablet  2. PAD (peripheral artery disease) (HCC)  I73.9   3. Primary hypertension  I10 bisoprolol (ZEBETA) 5 MG tablet  4. Pure hypercholesterolemia  E78.00   5. Tobacco abuse  Z72.0   6. Asymptomatic bilateral carotid artery stenosis  I65.23 PCV CAROTID DUPLEX (BILATERAL)   Recommendations:   [name]  is a 54 y.o. male  with history of Type I Diabetes, rheumatoid arthritis, hyperlipidemia, and CAD S/P coronary artery bypass grafting on 04/27/2014.  This is a 17-month office visit, presently doing well, he has not had recurrence of chest pain.    He does have mild symptoms of claudication with occasional leg cramps, but overall stable.  He probably has small vessel disease related to diabetes mellitus and tobacco use.   Reviewed his recent labs, lipids are well controlled, blood pressure is also well controlled.  Bystolic was denied by his insurance, I  will try bisoprolol 5 mg daily.  He will continue with Repatha.  We again discussed regarding complete abstinence from tobacco use.  He has asymptomatic bilateral carotid stenosis, needs carotid artery duplex for surveillance.  I will see him back in 6 months.  Yates Decamp, MD, [facility] 11/07/2019, 1:35 PM [facility] Office: [phone]

## 2019-11-11 DIAGNOSIS — R600 Localized edema: Secondary | ICD-10-CM | POA: Insufficient documentation

## 2019-11-14 ENCOUNTER — Other Ambulatory Visit: Payer: Self-pay

## 2019-11-14 ENCOUNTER — Ambulatory Visit: Payer: Self-pay

## 2019-11-14 ENCOUNTER — Encounter: Payer: Self-pay | Admitting: Orthopaedic Surgery

## 2019-11-14 ENCOUNTER — Ambulatory Visit: Payer: 59 | Admitting: Orthopaedic Surgery

## 2019-11-14 DIAGNOSIS — M84375D Stress fracture, left foot, subsequent encounter for fracture with routine healing: Secondary | ICD-10-CM

## 2019-11-14 DIAGNOSIS — S92352K Displaced fracture of fifth metatarsal bone, left foot, subsequent encounter for fracture with nonunion: Secondary | ICD-10-CM

## 2019-11-14 DIAGNOSIS — S92902K Unspecified fracture of left foot, subsequent encounter for fracture with nonunion: Secondary | ICD-10-CM

## 2019-11-14 NOTE — Progress Notes (Signed)
Office Visit Note   Patient: [name]           Date of Birth: [birth year]           MRN: [id number] Visit Date: 11/14/2019              Requested by: Leanna Battles, MD [place],  [place] [id number] PCP: Leanna Battles, MD   Assessment & Plan: Visit Diagnoses:  1. Stress fracture of left foot with routine healing, subsequent encounter   2. 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  I would like to obtain a CT scan to further assess the left foot fifth metatarsal for any evidence of healing of the bone.  I have talked about the possibility of surgery at this point.  I would like to see him back in just 1 week because by then hopefully he will have had the CT scan and we can go over this and a treatment plan in more detail.  All questions and concerns were answered and addressed.  Follow-Up Instructions: Return in about 1 week (around 11/21/2019).   Orders:  Orders Placed This Encounter  Procedures  . XR Foot 2 Views Left   No orders of the defined types were placed in this encounter.     Procedures: No procedures performed   Clinical Data: No additional findings.   Subjective: Chief Complaint  Patient presents with  . Left Foot - Follow-up  The patient is well-known to me.  In October of last year he did sustain 1/5 metatarsal fracture of the left foot at the metaphyseal diaphyseal junction.  This was more of a stress fracture.  We are seeing him with x-rays today due to continued left foot pain.  Previous x-rays look like this could go on to heal.  He is on his feet on a daily basis.  He is in [employer] organ[employer].  He is 54 years old.  He is a type I diabetic but reports good blood glucose control.  He does smoke.  HPI  Review of Systems He currently denies any headache, chest pain, shortness of breath, fever,  chills, nausea, vomiting  Objective: Vital Signs: There were no vitals taken for this visit.  Physical Exam He is alert and orient x3 and in no acute distress Ortho Exam Examination of his left foot does show pain over the fifth metatarsal at the metaphyseal diaphyseal junction.  His foot otherwise is well-perfused with normal sensation. Specialty Comments:  No specialty comments available.  Imaging: XR Foot 2 Views Left  Result Date: 11/14/2019 An AP and oblique of the left foot shows a fracture of the base of the fifth metatarsal at the metaphyseal diaphyseal junction.  This is a Jones fracture.  There is evidence of fracture nonunion when compared to previous films.    PMFS History: Patient Active Problem List   Diagnosis Date Noted  . PVD (peripheral vascular disease) ([place]) 10/11/2019  . Chronic venous insufficiency 10/05/2018  . Controlled type 1 diabetes mellitus with diabetic peripheral angiopathy without gangrene ([place]) 08/14/2017  . Dyslipidemia 03/26/2017  . High risk medication use 09/23/2016  . History of hypertension 09/23/2016  . History of chronic kidney disease 09/23/2016  . History of coronary artery disease 09/23/2016  . Tobacco abuse 09/23/2016  . Primary osteoarthritis of both knees 09/23/2016  .  History of diabetes mellitus 09/23/2016  . 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 Chest pain with high risk for cardiac etiology 10/29/2015  . Asymptomatic bilateral carotid artery stenosis 06/08/2014  . Pleural effusion, bilateral 06/03/2014  . Dyspnea 06/01/2014  . Diabetes (HCC) 05/03/2014  . 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/03/2014  . 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 Chronic renal disease, stage 3, moderately decreased glomerular filtration rate between 30-59 mL/min/1.73 square meter 05/03/2014  . CAD (coronary artery disease) 04/27/2014   Past Medical History:  Diagnosis Date  . Anginal pain (HCC)   . Arthritis    RA IN HANDS  .  Asthma   . CAD (coronary artery disease) 04/27/2014  . Chronic renal disease, stage 3, moderately decreased glomerular filtration rate between 30-59 mL/min/1.73 square meter 05/03/2014  . Coronary artery disease   . Diabetes (HCC) 05/03/2014  . Diabetes mellitus without complication (HCC)    type 1  . Hyperlipidemia   . Left shoulder pain   . 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/03/2014  . Shortness of breath   . Sleep apnea    mild OSA, no CPAP  . Unstable angina pectoris (HCC) 04/25/2014    Family History  Problem Relation Age of Onset  . Prostate cancer Father   . Heart disease Father   . Stomach cancer Mother   . Heart disease Brother   . Asthma Daughter   . Healthy Daughter     Past Surgical History:  Procedure Laterality Date  . CARDIAC CATHETERIZATION  04/25/2014   BY DR Jacinto Halim  . CARDIAC CATHETERIZATION N/A 10/30/2015   Procedure: Left Heart Cath and Cors/Grafts Angiography;  Surgeon: Yates Decamp, MD;  Location: [facility] INVASIVE CV LAB;  Service: Cardiovascular;  Laterality: N/A;  . CARPAL TUNNEL RELEASE    . CORONARY ARTERY BYPASS GRAFT N/A 04/27/2014   Procedure: CORONARY ARTERY BYPASS GRAFTING on pump using left internal mammary artery to LAD coronary artery, right great saphenous vein graft to diagonal coronary artery with sequential to OM1 and circumflex coronary arteries. Right greater saphenous vein graft to posterior descending coronary artery. ;  Surgeon: Delight Ovens, MD;  Location: [facility] OR;  Service: Open Heart Surgery;  Laterality: N  . elbow drained Left 05/09/2019  . ENDOVEIN HARVEST OF GREATER SAPHENOUS VEIN Right 04/27/2014   Procedure: ENDOVEIN HARVEST OF GREATER SAPHENOUS VEIN;  Surgeon: Delight Ovens, MD;  Location: [facility] OR;  Service: Open Heart Surgery;  Laterality: Right;  . EXCISION ORAL TUMOR N/A 03/01/2018   Procedure: EXCISION ORAL TUMOR;  Surgeon: Christia Reading, MD;  Location: [facility];  Service: ENT;  Laterality: N/A;  .  EYE SURGERY     LASER  . INTRAOPERATIVE TRANSESOPHAGEAL ECHOCARDIOGRAM N/A 04/27/2014   Procedure: INTRAOPERATIVE TRANSESOPHAGEAL ECHOCARDIOGRAM;  Surgeon: Delight Ovens, MD;  Location: [facility] OR;  Service: Open Heart Surgery;  Laterality: N/A;  . LEFT HEART CATHETERIZATION WITH CORONARY ANGIOGRAM N/A 04/25/2014   Procedure: LEFT HEART CATHETERIZATION WITH CORONARY ANGIOGRAM;  Surgeon: Pamella Pert, MD;  Location: [facility] CATH LAB;  Service: Cardiovascular;  Laterality: N/A;  . MOUTH SURGERY  11/15/2017   tongue surgery    Social History   Occupational History  . Not on file  Tobacco Use  . Smoking status: Current Every Day Smoker    Packs/day: 0.50    Years: 34.00    Pack years: 17.00    Types: Cigarettes  . Smokeless tobacco: Never Used  Substance and Sexual Activity  .  Alcohol use: Yes    Alcohol/week: 0.0 standard drinks    Comment: RARE  . Drug use: No  . Sexual activity: Not on file

## 2019-11-17 ENCOUNTER — Ambulatory Visit: Payer: 59 | Admitting: Cardiology

## 2019-11-24 ENCOUNTER — Ambulatory Visit: Payer: 59 | Admitting: Orthopaedic Surgery

## 2019-11-25 ENCOUNTER — Ambulatory Visit
Admission: RE | Admit: 2019-11-25 | Discharge: 2019-11-25 | Disposition: A | Discharge status: Home / Self Care | Payer: 59 | Source: Ambulatory Visit | Attending: Orthopaedic Surgery | Admitting: Orthopaedic Surgery

## 2019-11-25 DIAGNOSIS — S92902K Unspecified fracture of left foot, subsequent encounter for fracture with nonunion: Secondary | ICD-10-CM

## 2019-11-28 ENCOUNTER — Encounter: Payer: Self-pay | Admitting: Orthopaedic Surgery

## 2019-11-28 ENCOUNTER — Other Ambulatory Visit: Payer: Self-pay

## 2019-11-28 ENCOUNTER — Ambulatory Visit (INDEPENDENT_AMBULATORY_CARE_PROVIDER_SITE_OTHER): Payer: 59 | Admitting: Orthopaedic Surgery

## 2019-11-28 DIAGNOSIS — S92352K Displaced fracture of fifth metatarsal bone, left foot, subsequent encounter for fracture with nonunion: Secondary | ICD-10-CM | POA: Diagnosis not present

## 2019-11-28 NOTE — Progress Notes (Signed)
The patient comes in today to go over CT scan of his left foot.  Radiographically on plain films we have been concerned about a nonunion of the fifth metatarsal Jones fracture of the left foot.  He still has pain in this area.  He is a diabetic but has a hemoglobin A1c of under 7.  He is a smoker.  He works for the [employer] and is on his feet quite a bit.  The CT scan is reviewed with him and it does show a nonunion of the left fifth metatarsal at the metaphyseal diaphyseal junction.  At this point I talked about surgical options showing him a model of the foot.  We went over CT scan.  I do feel that he needs a surgery that would involve taking down the fracture nonunion and placing supplemental allograft bone graft and then placing a single cannulated screw down the fifth metatarsal try to compress the fracture as well.  I explained in detail what the surgery involves as well as the risk and benefits.  We talked about his smoking and diabetes.  We did talk about him having to be nonweightbearing for least 4 weeks after surgery and what that would involve from a work standpoint.  All question concerns were answered and addressed.  He has our surgical scheduler's card and will let us know when he would like [place] to schedule this.  I am happy to talk him through this any further at any point that he needs this to be discussed.

## 2019-12-09 NOTE — Progress Notes (Signed)
Office Visit Note  Patient: [name]             Date of Birth: 1966-07-27           MRN: [id number]             PCP: Leanna Battles, MD Referring: Leanna Battles, MD Visit Date: 12/19/2019 Occupation: @GUAROCC @  Subjective:  Pain in multiple joints   History of Present Illness: [name] is a 54 y.o. male with history of seropositive rheumatoid arthritis and osteoarthritis.  He is on Orencia 125 mg sq injections once weekly since September 2020.  He has not missed any doses of Orencia recently.  He continues to have recurrent flares in multiple joints.  He experiences intermittent pain in both shoulders, both elbows, both hands, and both knee joints.  He has intermittent swelling in both hands and both knees.  He states that he has difficulty rising from a seated position due to the pain and stiffness in his knee joints.  He does not feels that [name] is effective at controlling his arthritis.  He has been taking Tylenol 1000 mg daily and gabapentin 300 mg by mouth at bedtime.    Activities of Daily Living:  Patient reports joint stiffness all day  Patient Reports nocturnal pain.  Difficulty dressing/grooming: Reports Difficulty climbing stairs: Reports Difficulty getting out of chair: Reports Difficulty using hands for taps, buttons, cutlery, and/or writing: Reports  Review of Systems  Constitutional: Positive for fatigue. Negative for night sweats.  HENT: Positive for mouth dryness. Negative for mouth sores and nose dryness.   Eyes: Negative for redness, itching and dryness.  Respiratory: Positive for wheezing. Negative for shortness of breath and difficulty breathing.   Cardiovascular: Negative for chest pain, palpitations, hypertension, irregular heartbeat and swelling in legs/feet.  Gastrointestinal: Positive for constipation and diarrhea. Negative for blood in stool.  Endocrine: Negative for increased urination.  Genitourinary: Negative for difficulty  urinating and painful urination.  Musculoskeletal: Positive for arthralgias, joint pain, joint swelling, myalgias, morning stiffness, muscle tenderness and myalgias. Negative for muscle weakness.  Skin: Negative for color change, rash, hair loss, nodules/bumps, redness, skin tightness, ulcers and sensitivity to sunlight.  Allergic/Immunologic: Negative for susceptible to infections.  Neurological: Positive for headaches. Negative for dizziness, fainting, memory loss and night sweats.  Hematological: Negative for bruising/bleeding tendency and swollen glands.  Psychiatric/Behavioral: Positive for sleep disturbance. Negative for depressed mood and confusion. The patient is not nervous/anxious.     PMFS History:  Patient Active Problem List   Diagnosis Date Noted  . PVD (peripheral vascular disease) ([place]) 10/11/2019  . Chronic venous insufficiency 10/05/2018  . Controlled type 1 diabetes mellitus with diabetic peripheral angiopathy without gangrene ([place]) 08/14/2017  . Dyslipidemia 03/26/2017  . High risk medication use 09/23/2016  . History of hypertension 09/23/2016  . History of chronic kidney disease 09/23/2016  . History of coronary artery disease 09/23/2016  . Tobacco abuse 09/23/2016  . Primary osteoarthritis of both knees 09/23/2016  . History of diabetes mellitus 09/23/2016  . 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 Chest pain with high risk for cardiac etiology 10/29/2015  . Asymptomatic bilateral carotid artery stenosis 06/08/2014  . Pleural effusion, bilateral 06/03/2014  . Dyspnea 06/01/2014  . Diabetes ([place]) 05/03/2014  . Rheumatoid arthritis involving multiple joints ([place]) 05/03/2014  . 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 Chronic renal disease, stage 3, moderately decreased glomerular filtration rate between 30-59 mL/min/1.73 square meter 05/03/2014  . CAD (coronary artery  disease) 04/27/2014    Past Medical History:  Diagnosis Date  . Anginal pain (HCC)   . Arthritis    RA  IN HANDS  . Asthma   . CAD (coronary artery disease) 04/27/2014  . Chronic renal disease, stage 3, moderately decreased glomerular filtration rate between 30-59 mL/min/1.73 square meter 05/03/2014  . Coronary artery disease   . Diabetes (HCC) 05/03/2014  . Diabetes mellitus without complication (HCC)    type 1  . Hyperlipidemia   . Left shoulder pain   . 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/03/2014  . Shortness of breath   . Sleep apnea    mild OSA, no CPAP  . Unstable angina pectoris (HCC) 04/25/2014    Family History  Problem Relation Age of Onset  . Prostate cancer Father   . Heart disease Father   . Stomach cancer Mother   . Heart disease Brother   . Asthma Daughter   . Healthy Daughter    Past Surgical History:  Procedure Laterality Date  . CARDIAC CATHETERIZATION  04/25/2014   BY DR Jacinto Halim  . CARDIAC CATHETERIZATION N/A 10/30/2015   Procedure: Left Heart Cath and Cors/Grafts Angiography;  Surgeon: Yates Decamp, MD;  Location: [facility] INVASIVE CV LAB;  Service: Cardiovascular;  Laterality: N/A;  . CARPAL TUNNEL RELEASE    . CATARACT EXTRACTION, BILATERAL    . CORONARY ARTERY BYPASS GRAFT N/A 04/27/2014   Procedure: CORONARY ARTERY BYPASS GRAFTING on pump using left internal mammary artery to LAD coronary artery, right great saphenous vein graft to diagonal coronary artery with sequential to OM1 and circumflex coronary arteries. Right greater saphenous vein graft to posterior descending coronary artery. ;  Surgeon: Delight Ovens, MD;  Location: [facility] OR;  Service: Open Heart Surgery;  Laterality: N  . elbow drained Left 05/09/2019  . ENDOVEIN HARVEST OF GREATER SAPHENOUS VEIN Right 04/27/2014   Procedure: ENDOVEIN HARVEST OF GREATER SAPHENOUS VEIN;  Surgeon: Delight Ovens, MD;  Location: [facility] OR;  Service: Open Heart Surgery;  Laterality: Right;  . EXCISION ORAL TUMOR N/A 03/01/2018   Procedure: EXCISION ORAL TUMOR;  Surgeon: Christia Reading, MD;  Location: [facility];  Service: ENT;  Laterality: N/A;  . EYE SURGERY     LASER  . EYE SURGERY Bilateral    astigmatism correction   . INTRAOPERATIVE TRANSESOPHAGEAL ECHOCARDIOGRAM N/A 04/27/2014   Procedure: INTRAOPERATIVE TRANSESOPHAGEAL ECHOCARDIOGRAM;  Surgeon: Delight Ovens, MD;  Location: [facility] OR;  Service: Open Heart Surgery;  Laterality: N/A;  . LEFT HEART CATHETERIZATION WITH CORONARY ANGIOGRAM N/A 04/25/2014   Procedure: LEFT HEART CATHETERIZATION WITH CORONARY ANGIOGRAM;  Surgeon: Pamella Pert, MD;  Location: [facility] CATH LAB;  Service: Cardiovascular;  Laterality: N/A;  . MOUTH SURGERY  11/15/2017   tongue surgery    Social History   Social History Narrative  . Not on file    There is no immunization history on file for this patient.   Objective: Vital Signs: BP (!) 105/53 (BP Location: Right Arm, Patient Position: Sitting, Cuff Size: Normal)   Pulse (!) 57   Resp 15   Ht 5\' 8"  (1.727 m)   Wt 200 lb (90.7 kg)   BMI 30.41 kg/m    Physical Exam Vitals and nursing note reviewed.  Constitutional:      Appearance: He is well-developed.  HENT:     Head: Normocephalic and atraumatic.  Eyes:     Conjunctiva/sclera: Conjunctivae normal.     Pupils: Pupils are equal, round, and  reactive to light.  Pulmonary:     Effort: Pulmonary effort is normal.  Abdominal:     General: Bowel sounds are normal.     Palpations: Abdomen is soft.  Musculoskeletal:     Cervical back: Normal range of motion and neck supple.  Skin:    General: Skin is warm and dry.     Capillary Refill: Capillary refill takes less than 2 seconds.  Neurological:     Mental Status: He is alert and oriented to person, place, and time.  Psychiatric:        Behavior: Behavior normal.      Musculoskeletal Exam: C-spine limited ROM.  Shoulder joints have painful ROM bilaterally.  Nodules on extensor surface of both elbows.  Thickening of olecranon bursa bilaterally.  Wrist joints good ROM with tenderness of  the right wrist.  Tenderness of bilateral 2nd and 3rd MCPs.  Tenderness and synovitis of the right 3rd, 4th, and 5th MCP joints and left 2nd and 5th PIP joints.  Hip joints painful ROM bilaterally.  Knee joints good ROM with no warmth or effusion.  No tenderness or inflammation of ankle joints.   CDAI Exam: CDAI Score: 15.3  Patient Global: 7 mm; Provider Global: 6 mm Swollen: 5 ; Tender: 9  Joint Exam 12/19/2019      Right  Left  Elbow   Tender     Wrist   Tender     MCP 2   Tender     MCP 3   Tender     PIP 2     Swollen Tender  PIP 3  Swollen Tender     PIP 4  Swollen Tender     PIP 5  Swollen Tender  Swollen Tender     Investigation: No additional findings.  Imaging: CT FOOT LEFT WO CONTRAST  Result Date: 11/25/2019 CLINICAL DATA:  Fifth metatarsal fracture. Patient presents for re-evaluation. EXAM: CT OF THE LEFT FOOT WITHOUT CONTRAST TECHNIQUE: Multidetector CT imaging of the left foot was performed according to the standard protocol. Multiplanar CT image reconstructions were also generated. COMPARISON:  X-ray 11/14/2019 FINDINGS: Bones/Joint/Cartilage Ununited fracture at the base of the fifth metatarsal. Sclerotic margin along the fracture cleft concerning for nonunion. No significant callus formation. No other acute fracture or dislocation. No aggressive osseous lesion. No joint effusion. Ligaments Suboptimally assessed by CT. Muscles and Tendons Muscles are normal. No muscle atrophy. No intramuscular fluid collection or hematoma. Flexor, extensor, peroneal and Achilles tendons are grossly intact. Soft tissues No fluid collection or hematoma. Peripheral vascular atherosclerotic disease. No soft tissue mass. IMPRESSION: Ununited fracture at the base of the fifth metatarsal. Sclerotic margin along the fracture cleft concerning for nonunion. No significant callus formation. Electronically Signed   By: Elige Ko   On: 11/25/2019 09:17    Recent Labs: Lab Results  Component  Value Date   WBC 9.6 09/26/2019   HGB 13.7 09/26/2019   PLT 270 09/26/2019   NA 137 09/26/2019   K 4.5 09/26/2019   CL 100 09/26/2019   CO2 28 09/26/2019   GLUCOSE 279 (H) 09/26/2019   BUN 13 09/26/2019   CREATININE 1.30 09/26/2019   BILITOT 0.4 09/26/2019   ALKPHOS 97 03/15/2018   AST 21 09/26/2019   ALT 29 09/26/2019   PROT 6.4 09/26/2019   ALBUMIN 3.7 03/15/2018   CALCIUM 9.4 09/26/2019   GFRAA 71 09/26/2019   QFTBGOLDPLUS NEGATIVE 03/21/2019    Speciality Comments: Prior therapy includes: Humira and Enbrel (inadequate  response), MTX (GI upset and elevated creatinine), Arava (diarrhea). He is not a good candidate for Plaquenil due to diabetic retinopathy and CKD stage 3.  Procedures:  No procedures performed Allergies: Morphine and related and Metoprolol   Assessment / Plan:     Visit Diagnoses: Rheumatoid arthritis involving multiple sites with positive rheumatoid factor (HCC): He has tenderness and synovitis of right 3rd, 4th, 5th MCPs and left 2nd and 5th MCPs.  He has been experiencing recurrent flares of multiple joints.  He has painful range of motion of both shoulder joints.  He has been experiencing increased pain and stiffness in both hands and has difficulty making a fist at times.  Is also been experiencing intermittent warmth and swelling in both knee joints and has had difficulty rising from a seated position due to the joint stiffness.  He is on Orencia 125 mg sq injections once weekly which was started in September 2020.  He does not feel as though [name] has been as effective as Enbrel was previously.  We discussed combination therapy but he could not tolerate taking Arava in the past and discontinue methotrexate due to elevated creatinine.  Different treatment options were discussed today. We will proceed with applying for Actemra 162 mg sq injections every 14 days.  Indications, contraindications, and side effects of Actemra were discussed. All questions were  addressed and consent was obtained.  Once actemra is approved he will be scheduled for an appointment for the administration of his first injection.  He will follow-up in the office in 6 weeks.  Medication counseling:   Baseline Immunosuppressant Therapy Labs  Quantiferon TB Gold Latest Ref Rng & Units 03/21/2019  Quantiferon TB Gold Plus NEGATIVE NEGATIVE       No results found for: HIV  Immunoglobulin Electrophoresis Latest Ref Rng & Units 09/09/2017  IgG 700 - 1,600 mg/dL 3,428  IgM 20 - 768 mg/dL 115    Serum Protein Electrophoresis Latest Ref Rng & Units 09/26/2019  Total Protein 6.1 - 8.1 g/dL 6.4    No results found for: G6PDH  No results found for: TPMT   Lipid Panel Lab Results  Component Value Date   CHOL 55 (L) 02/10/2019   HDL 29 (L) 02/10/2019   LDLCALC 17 02/10/2019   TRIG 43 02/10/2019   CHOLHDL 1.9 02/10/2019     Chest x-ray: no active cardiopulmonary disease on 12/06/15  Counseled patient that Actemra is an IL-6 blocking agent.   Counseled patient on purpose, proper use, and adverse effects of Actemra.  Reviewed the most common adverse effects including infections, injection site reaction, bowel injury, and rarely cancer and conditions of the nervous system.  Reviewed that the medication should be held during infections.  Discussed that there is the possibility of an increased risk of malignancy but it is not well understood if this increased risk is due to the medication or the disease state.  Counseled patient that Actemra should be held prior to scheduled surgery.  Counseled patient to avoid live vaccines while on Actemra.  Recommend annual influenza, Pneumovax 23, Prevnar 13, and Shingrix as indicated.   Reviewed the importance of regular labs while on Actemra therapy including the need for routine lipid panel.  Advised patient to get standing labs one month after starting Actemra.  Provided patient with standing lab orders.    Provided patient with medication  education material and answered all questions.  Patient voiced understanding.  Patient consented to Actemra.  Will upload consent into the media  tab.  Reviewed storage instructions of Actemra with patient.  Advised patient that initial Actemra injection must be given in the office.  Will apply for Actemra through patient's insurance.    Patient dose will be  162 mg every 14 days based on weight <100 kg .  Prescription pending lab results and/or insurance approval.  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 Inadequate response to Enbrel, Humira, D/c MTX, Arava-SE, PLQ-retinopathy.  CBC, CMP, TB gold, hepatitis panel, HIV, and SPEP were ordered today.  He will return for lab work in 1 month and every 3 months.  Standing orders are in place.- Plan: CBC with Differential/Platelet, COMPLETE METABOLIC PANEL WITH GFR, QuantiFERON-TB Gold Plus, Hepatitis panel, acute, HIV Antibody (routine testing w rflx), Serum protein electrophoresis with reflex  Septic olecranon bursitis of left elbow - Resolved.  He has thickening of the olecranon bursa but no tenderness or active inflammation was noted.  Primary osteoarthritis of both knees: He has good range of motion of both knee joints on exam.  No warmth or effusion was noted.  Has been experiencing intermittent pain and inflammation in both knees.  He has difficulty climbing steps and rising from a seated position due to the discomfort and stiffness bilaterally.  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  He continues to follow up with Dr. Magnus Ivan.  He is hesitant to proceed with surgery due to the risk of infection secondary to his underlying diabetes.  Osteopenia of multiple sites - DEXA 06/09/2019 T score -1.9  right femoral neck.  He is taking a vitamin D supplement.  Other medical conditions are listed as follows:   History of diabetes mellitus  History of coronary artery disease  History of chronic kidney disease  History of hypertension  Tobacco abuse  Dyslipidemia  Orders: Orders Placed This Encounter  Procedures  . CBC with Differential/Platelet  . COMPLETE METABOLIC PANEL WITH GFR  . QuantiFERON-TB Gold Plus  . Hepatitis panel, acute  . HIV Antibody (routine testing w rflx)  . Serum protein electrophoresis with reflex   No orders of the defined types were placed in this encounter.   Face-to-face time spent with patient was 30 minutes. Greater than 50% of time was spent in counseling and coordination of care.  Follow-Up Instructions: Return in about 6 weeks (around 01/30/2020) for Rheumatoid arthritis.   Sherron Ales, PA-C  I examined and evaluated the patient with Sherron Ales PA.  Patient has been having a flare with increased pain and swelling in multiple joints.  He had an inadequate response to Orencia.  He had detailed discussion regarding different treatment options and their side effects.  Patient was in agreement to proceed with Actemra.  The plan of care was discussed as noted above.  Pollyann Savoy, MD Note - This record has been created using Animal nutritionist.  Chart creation errors have been sought, but may not always  have been located. Such creation errors do not reflect on  the standard of medical care.

## 2019-12-12 ENCOUNTER — Other Ambulatory Visit: Payer: 59

## 2019-12-15 ENCOUNTER — Ambulatory Visit: Payer: 59

## 2019-12-15 ENCOUNTER — Other Ambulatory Visit: Payer: Self-pay

## 2019-12-15 DIAGNOSIS — I6523 Occlusion and stenosis of bilateral carotid arteries: Secondary | ICD-10-CM

## 2019-12-19 ENCOUNTER — Other Ambulatory Visit: Payer: Self-pay

## 2019-12-19 ENCOUNTER — Ambulatory Visit: Payer: 59 | Admitting: Rheumatology

## 2019-12-19 ENCOUNTER — Telehealth: Payer: Self-pay | Admitting: Pharmacist

## 2019-12-19 ENCOUNTER — Encounter: Payer: Self-pay | Admitting: Physician Assistant

## 2019-12-19 VITALS — BP 105/53 | HR 57 | Resp 15 | Ht 68.0 in | Wt 200.0 lb

## 2019-12-19 DIAGNOSIS — Z8679 Personal history of other diseases of the circulatory system: Secondary | ICD-10-CM

## 2019-12-19 DIAGNOSIS — Z8639 Personal history of other endocrine, nutritional and metabolic disease: Secondary | ICD-10-CM

## 2019-12-19 DIAGNOSIS — S92355K Nondisplaced fracture of fifth metatarsal bone, left foot, subsequent encounter for fracture with nonunion: Secondary | ICD-10-CM

## 2019-12-19 DIAGNOSIS — Z79899 Other long term (current) drug therapy: Secondary | ICD-10-CM | POA: Diagnosis not present

## 2019-12-19 DIAGNOSIS — M0579 Rheumatoid arthritis with rheumatoid factor of multiple sites without organ or systems involvement: Secondary | ICD-10-CM | POA: Diagnosis not present

## 2019-12-19 DIAGNOSIS — Z720 Tobacco use: Secondary | ICD-10-CM

## 2019-12-19 DIAGNOSIS — M0630 Rheumatoid nodule, unspecified site: Secondary | ICD-10-CM | POA: Diagnosis not present

## 2019-12-19 DIAGNOSIS — S92355D Nondisplaced fracture of fifth metatarsal bone, left foot, subsequent encounter for fracture with routine healing: Secondary | ICD-10-CM

## 2019-12-19 DIAGNOSIS — M170 Bilateral primary osteoarthritis of knee: Secondary | ICD-10-CM

## 2019-12-19 DIAGNOSIS — Z87448 Personal history of other diseases of urinary system: Secondary | ICD-10-CM

## 2019-12-19 DIAGNOSIS — M71122 Other infective bursitis, left elbow: Secondary | ICD-10-CM | POA: Diagnosis not present

## 2019-12-19 DIAGNOSIS — M8589 Other specified disorders of bone density and structure, multiple sites: Secondary | ICD-10-CM

## 2019-12-19 DIAGNOSIS — E785 Hyperlipidemia, unspecified: Secondary | ICD-10-CM

## 2019-12-19 NOTE — Telephone Encounter (Signed)
Please start BIV for Actemra for the treatment of RA.  Prior therapy includes: Humira and Enbrel (inadequate response), MTX (GI upset and elevated creatinine), Arava (diarrhea), and Orencia (inadequate response).  Dose will be 162 mg every 14 days.   Verlin Fester, PharmD, [place], CPP Clinical Specialty Pharmacist [phone]  12/19/2019 10:33 AM

## 2019-12-19 NOTE — Telephone Encounter (Signed)
Submitted a Prior Authorization request to [facility] for ACTEMRA via Cover My Meds. Will update once we receive a response.  (KeyMarland Kitchen [id number]) - [id number]

## 2019-12-19 NOTE — Progress Notes (Signed)
Pharmacy Note Subjective: Patient presents today to the [facility] for follow up office visit.   Patient seen by the pharmacist for counseling on Actemra for rheumatoid arthritis.  Prior therapy includes: Humira and Enbrel (inadequate response), MTX (GI upset and elevated creatinine), Arava (diarrhea), and Orencia (inadequate response).  Objective: CBC    Component Value Date/Time   WBC 9.6 09/26/2019 0852   RBC 4.46 09/26/2019 0852   HGB 13.7 09/26/2019 0852   HGB 13.2 03/15/2018 0846   HCT 41.4 09/26/2019 0852   PLT 270 09/26/2019 0852   PLT 199 03/15/2018 0846   MCV 92.8 09/26/2019 0852   MCH 30.7 09/26/2019 0852   MCHC 33.1 09/26/2019 0852   RDW 13.1 09/26/2019 0852   LYMPHSABS 2,957 09/26/2019 0852   MONOABS 0.6 03/15/2018 0846   EOSABS 518 (H) 09/26/2019 0852   BASOSABS 125 09/26/2019 0852    CMP     Component Value Date/Time   NA 137 09/26/2019 0852   K 4.5 09/26/2019 0852   CL 100 09/26/2019 0852   CO2 28 09/26/2019 0852   GLUCOSE 279 (H) 09/26/2019 0852   BUN 13 09/26/2019 0852   CREATININE 1.30 09/26/2019 0852   CALCIUM 9.4 09/26/2019 0852   PROT 6.4 09/26/2019 0852   ALBUMIN 3.7 03/15/2018 0846   AST 21 09/26/2019 0852   AST 17 03/15/2018 0846   ALT 29 09/26/2019 0852   ALT 17 03/15/2018 0846   ALKPHOS 97 03/15/2018 0846   BILITOT 0.4 09/26/2019 0852   BILITOT 0.3 03/15/2018 0846   GFRNONAA 61 09/26/2019 0852   GFRAA 71 09/26/2019 0852     Baseline Immunosuppressant Therapy Labs  Quantiferon TB Gold Latest Ref Rng & Units 03/21/2019  Quantiferon TB Gold Plus NEGATIVE NEGATIVE       No results found for: HIV  Immunoglobulin Electrophoresis Latest Ref Rng & Units 09/09/2017  IgG 700 - 1,600 mg/dL 1,610  IgM 20 - 960 mg/dL 454    Serum Protein Electrophoresis Latest Ref Rng & Units 09/26/2019  Total Protein 6.1 - 8.1 g/dL 6.4    No results found for: G6PDH  No results found for: TPMT   Lipid Panel Lab Results  Component Value  Date   CHOL 55 (L) 02/10/2019   HDL 29 (L) 02/10/2019   LDLCALC 17 02/10/2019   TRIG 43 02/10/2019   CHOLHDL 1.9 02/10/2019     Chest x-ray: No active cardiopulmonary disease 12/06/15  Assessment/Plan:  Counseled patient that Actemra is an IL-6 blocking agent. Counseled patient on purpose, proper use, and adverse effects of Actemra.  Reviewed the most common adverse effects including infections, injection site reaction, bowel injury, and rarely cancer and conditions of the nervous system.  Reviewed that the medication should be held during infections. Counseled patient that Actemra should be held prior to scheduled surgery. Discussed that there is the possibility of an increased risk of malignancy but it is not well understood if this increased risk is due to the medication or the disease state.  Counseled patient to avoid live vaccines while on Actemra. Recommend annual influenza, Pneumovax 23, Prevnar 13, and Shingrix as indicated.   Reviewed the importance of regular labs while on Actemra therapy including the need for routine lipid panel.  Advised patient to get standing labs one month after starting Actemra.  Provided patient with standing lab orders.  Provided patient with medication education material and answered all questions.  Patient voiced understanding.  Patient consented to Actemra.  Will upload consent  into the media tab.  Reviewed storage instructions of Actemra with patient.  Advised patient that initial Actemra injection must be given in the office.  Will apply for Actemra through patient's insurance.    Patient dose will be  162 mg every 14 days based on weight <100 kg .  Prescription pending lab results and/or insurance approval.  All questions encouraged and answered.  Instructed patient to call with any further questions or concerns.   Mariella Saa, PharmD, [place], [place] Clinical Specialty Pharmacist [phone]  12/19/2019 10:34 AM

## 2019-12-20 ENCOUNTER — Other Ambulatory Visit: Payer: Self-pay | Admitting: Cardiology

## 2019-12-20 DIAGNOSIS — I6523 Occlusion and stenosis of bilateral carotid arteries: Secondary | ICD-10-CM

## 2019-12-21 LAB — COMPLETE METABOLIC PANEL WITH GFR
AG Ratio: 1.7 (calc) (ref 1.0–2.5)
ALT: 40 U/L (ref 9–46)
AST: 37 U/L — ABNORMAL HIGH (ref 10–35)
Albumin: 4.1 g/dL (ref 3.6–5.1)
Alkaline phosphatase (APISO): 101 U/L (ref 35–144)
BUN/Creatinine Ratio: 9 (calc) (ref 6–22)
BUN: 14 mg/dL (ref 7–25)
CO2: 25 mmol/L (ref 20–32)
Calcium: 9 mg/dL (ref 8.6–10.3)
Chloride: 105 mmol/L (ref 98–110)
Creat: 1.54 mg/dL — ABNORMAL HIGH (ref 0.70–1.33)
GFR, Est African American: 58 mL/min/{1.73_m2} — ABNORMAL LOW (ref >=60)
GFR, Est Non African American: 50 mL/min/{1.73_m2} — ABNORMAL LOW (ref >=60)
Globulin: 2.4 g/dL (calc) (ref 1.9–3.7)
Glucose, Bld: 89 mg/dL (ref 65–99)
Potassium: 4.6 mmol/L (ref 3.5–5.3)
Sodium: 138 mmol/L (ref 135–146)
Total Bilirubin: 0.3 mg/dL (ref 0.2–1.2)
Total Protein: 6.5 g/dL (ref 6.1–8.1)

## 2019-12-21 LAB — CBC WITH DIFFERENTIAL/PLATELET
Absolute Monocytes: 693 cells/uL (ref 200–950)
Basophils Absolute: 116 cells/uL (ref 0–200)
Basophils Relative: 1.1 %
Eosinophils Absolute: 525 cells/uL — ABNORMAL HIGH (ref 15–500)
Eosinophils Relative: 5 %
HCT: 39.4 % (ref 38.5–50.0)
Hemoglobin: 13.1 g/dL — ABNORMAL LOW (ref 13.2–17.1)
Lymphs Abs: 3507 cells/uL (ref 850–3900)
MCH: 30.7 pg (ref 27.0–33.0)
MCHC: 33.2 g/dL (ref 32.0–36.0)
MCV: 92.3 fL (ref 80.0–100.0)
MPV: 11.1 fL (ref 7.5–12.5)
Monocytes Relative: 6.6 %
Neutro Abs: 5660 cells/uL (ref 1500–7800)
Neutrophils Relative %: 53.9 %
Platelets: 251 10*3/uL (ref 140–400)
RBC: 4.27 10*6/uL (ref 4.20–5.80)
RDW: 13.3 % (ref 11.0–15.0)
Total Lymphocyte: 33.4 %
WBC: 10.5 10*3/uL (ref 3.8–10.8)

## 2019-12-21 LAB — HIV ANTIBODY (ROUTINE TESTING W REFLEX): HIV 1&2 Ab, 4th Generation: NONREACTIVE

## 2019-12-21 LAB — HEPATITIS PANEL, ACUTE
Hep A IgM: NONREACTIVE
Hep B C IgM: NONREACTIVE
Hepatitis B Surface Ag: NONREACTIVE
Hepatitis C Ab: NONREACTIVE
SIGNAL TO CUT-OFF: 0.02 (ref <1.00)

## 2019-12-21 LAB — PROTEIN ELECTROPHORESIS, SERUM, WITH REFLEX
Albumin ELP: 3.8 g/dL (ref 3.8–4.8)
Alpha 1: 0.3 g/dL (ref 0.2–0.3)
Alpha 2: 0.6 g/dL (ref 0.5–0.9)
Beta 2: 0.4 g/dL (ref 0.2–0.5)
Beta Globulin: 0.4 g/dL (ref 0.4–0.6)
Gamma Globulin: 1 g/dL (ref 0.8–1.7)
Total Protein: 6.5 g/dL (ref 6.1–8.1)

## 2019-12-21 NOTE — Telephone Encounter (Signed)
Received a fax regarding Prior Authorization from [name] for ACTEMRA. Authorization has been DENIED because medicine is covered only [id number] has a history of failure, contraindication, or intolerance to ONE of the following preferred biologic products* (Document drug, date, and duration of trial): (i) Cimzia (certolizumab). (ii) Simponi (golimumab). (iii) Olumiant (baricitinib). (iv) Rinvoq (upadacitinib). (v) Xeljanz/Xeljanz XR (tofacitinib).  Phone# [phone]

## 2019-12-22 ENCOUNTER — Telehealth: Payer: Self-pay | Admitting: Rheumatology

## 2019-12-22 NOTE — Telephone Encounter (Signed)
Patient called stating he received the denial from [employer] for his medication.  Patient is requesting a return call to "discuss the next steps on getting the medication approved."

## 2019-12-23 ENCOUNTER — Telehealth: Payer: Self-pay | Admitting: Rheumatology

## 2019-12-23 NOTE — Telephone Encounter (Signed)
Returned patient call.  Patient states that he is frustrated as he switched insurance and the majority of his medications are originally denied.  Advised patient that we have been having the same issues with insurance but we will submit an appeal for approval.  Also let patient know that we will look into manufacture patient assistance and sometimes they allow for approval.  Patient states insurance has normal medication patient verbalized understanding.    Advised we will reach out once we hear response from insurance in regards to the appeal   Verlin Fester, PharmD, BCACP, CPP Clinical Specialty Pharmacist (Rheumatology and Pulmonology)  12/23/2019 8:48 AM

## 2019-12-23 NOTE — Telephone Encounter (Signed)
Returned patient's call.  No answer and left voicemail notifying patient that there was nothing that he needed to do at this time but we are submitting an appeal today for approval.  Advised that we will update him when we hear response but to call the office with any other questions or concerns.   Verlin Fester, PharmD, [place], CPP Clinical Specialty Pharmacist [phone]  12/23/2019 8:35 AM

## 2019-12-23 NOTE — Telephone Encounter (Signed)
Patient called stating he was returning Amber's call.

## 2019-12-26 ENCOUNTER — Encounter: Payer: Self-pay | Admitting: Pharmacist

## 2019-12-26 NOTE — Progress Notes (Unsigned)
Appeal for Actemra.

## 2019-12-27 NOTE — Telephone Encounter (Signed)
Appeal faxed in to [employer]. Will update once we receive a response.  Fax# [phone] Phone# [phone]

## 2020-01-03 ENCOUNTER — Other Ambulatory Visit: Payer: Self-pay | Admitting: Cardiology

## 2020-01-07 ENCOUNTER — Encounter: Payer: Self-pay | Admitting: Orthopaedic Surgery

## 2020-01-10 DIAGNOSIS — R600 Localized edema: Secondary | ICD-10-CM

## 2020-01-10 DIAGNOSIS — S8992XS Unspecified injury of left lower leg, sequela: Secondary | ICD-10-CM

## 2020-01-11 NOTE — Telephone Encounter (Signed)
Orders placed.

## 2020-01-11 NOTE — Telephone Encounter (Signed)
Patient had trauma to his left foot and states that he has had a broken bone however was recommended by orthopedics for conservative management in view of high risk for infection.  Over the past 3 to 4 days he has developed severe swelling and pain in his left calf and his left leg that is persistent in spite of leg rising and resting.  On review of his media that he has sent me, it appears that he may have developed DVT.  Will obtain lower extremity venous duplex and depending upon the results we will manage it appropriately.  Patient is aware.  We will try to get this done ASAP.    ICD-10-CM   1. Leg edema, left  R60.0 VAS [place] LOWER EXTREMITY VENOUS (DVT)    CANCELED: UE VENOUS DUPLEX  2. Traumatic leg injury, left, sequela  S89.92XS VAS [place] LOWER EXTREMITY VENOUS (DVT)    CANCELED: UE VENOUS DUPLEX    Yates Decamp, MD, [facility] 01/11/2020, 12:52 PM [facility]. [place] Office: [phone]   12 min tel encounter

## 2020-01-12 ENCOUNTER — Other Ambulatory Visit: Payer: Self-pay

## 2020-01-12 ENCOUNTER — Ambulatory Visit (HOSPITAL_COMMUNITY)
Admission: RE | Admit: 2020-01-12 | Discharge: 2020-01-12 | Disposition: A | Discharge status: Home / Self Care | Payer: 59 | Source: Ambulatory Visit | Attending: Cardiology | Admitting: Cardiology

## 2020-01-12 DIAGNOSIS — R600 Localized edema: Secondary | ICD-10-CM | POA: Diagnosis present

## 2020-01-12 DIAGNOSIS — S8992XS Unspecified injury of left lower leg, sequela: Secondary | ICD-10-CM | POA: Diagnosis present

## 2020-01-12 NOTE — Progress Notes (Signed)
VASCULAR LAB PRELIMINARY  PRELIMINARY  PRELIMINARY  PRELIMINARY  Left lower extremity venous duplex completed.    Preliminary report:  See CV proc for preliminary results.  Called Dr. Verl Dicker office with report  Sherren Kerns, RVT 01/12/2020, 9:53 AM

## 2020-01-12 NOTE — Progress Notes (Signed)
No DVT

## 2020-01-13 NOTE — Telephone Encounter (Signed)
Called [employer] to check status of appeal. Per rep Erskine Squibb, Appeal was over turned on 01/10/20. Rep was unable to advise of PA end date, says letter will be sent to the office. Patient must fill thorough Building surveyor.  Patient has Nurse, learning disability and is able to use a copay card.  Ref# [id number] Phone# [phone]

## 2020-01-13 NOTE — Progress Notes (Signed)
Pharmacy Note  Subjective:   Patient presents to clinic today to receive first dose of Actemra. He was on Orencia and last dose was 3 weeks ago.  Patient running a fever or have signs/symptoms of infection? No  Patient currently on antibiotics for the treatment of infection? No  Patient have any upcoming invasive procedures/surgeries? No  Prior therapy includes: Orencia (inadequate response), Humira and Enbrel (inadequate response), MTX (GI upset and elevated creatinine), Arava (diarrhea). He is not a good candidate for Plaquenil due to diabetic retinopathy and CKD stage 3.  He is not a good candidate for JAK inhibitors due to CAD, PVD, and chronic venous insufficiency.  Objective: CMP     Component Value Date/Time   NA 138 12/19/2019 0916   K 4.6 12/19/2019 0916   CL 105 12/19/2019 0916   CO2 25 12/19/2019 0916   GLUCOSE 89 12/19/2019 0916   BUN 14 12/19/2019 0916   CREATININE 1.54 (H) 12/19/2019 0916   CALCIUM 9.0 12/19/2019 0916   PROT 6.5 12/19/2019 0916   PROT 6.5 12/19/2019 0916   ALBUMIN 3.7 03/15/2018 0846   AST 37 (H) 12/19/2019 0916   AST 17 03/15/2018 0846   ALT 40 12/19/2019 0916   ALT 17 03/15/2018 0846   ALKPHOS 97 03/15/2018 0846   BILITOT 0.3 12/19/2019 0916   BILITOT 0.3 03/15/2018 0846   GFRNONAA 50 (L) 12/19/2019 0916   GFRAA 58 (L) 12/19/2019 0916    CBC    Component Value Date/Time   WBC 10.5 12/19/2019 0916   RBC 4.27 12/19/2019 0916   HGB 13.1 (L) 12/19/2019 0916   HGB 13.2 03/15/2018 0846   HCT 39.4 12/19/2019 0916   PLT 251 12/19/2019 0916   PLT 199 03/15/2018 0846   MCV 92.3 12/19/2019 0916   MCH 30.7 12/19/2019 0916   MCHC 33.2 12/19/2019 0916   RDW 13.3 12/19/2019 0916   LYMPHSABS 3,507 12/19/2019 0916   MONOABS 0.6 03/15/2018 0846   EOSABS 525 (H) 12/19/2019 0916   BASOSABS 116 12/19/2019 0916    Baseline Immunosuppressant Therapy Labs TB GOLD Quantiferon TB Gold Latest Ref Rng & Units 03/21/2019  Quantiferon TB Gold Plus  NEGATIVE NEGATIVE   Hepatitis Panel Hepatitis Latest Ref Rng & Units 12/19/2019  Hep B Surface Ag NON-REACTI NON-REACTIVE  Hep B IgM NON-REACTI NON-REACTIVE  Hep C Ab NON-REACTI NON-REACTIVE  Hep C Ab NON-REACTI NON-REACTIVE  Hep A IgM NON-REACTI NON-REACTIVE   HIV Lab Results  Component Value Date   HIV NON-REACTIVE 12/19/2019   Immunoglobulins Immunoglobulin Electrophoresis Latest Ref Rng & Units 09/09/2017  IgG 700 - 1,600 mg/dL 9,417  IgM 20 - 408 mg/dL 144   SPEP Serum Protein Electrophoresis Latest Ref Rng & Units 12/19/2019  Total Protein 6.1 - 8.1 g/dL 6.5  Albumin 3.8 - 4.8 g/dL -  Alpha-1 0.2 - 0.3 g/dL -  Alpha-2 0.5 - 0.9 g/dL -  Beta Globulin 0.4 - 0.6 g/dL -  Beta 2 0.2 - 0.5 g/dL -  Gamma Globulin 0.8 - 1.7 g/dL -   Y1EH No results found for: G6PDH TPMT No results found for: TPMT   Chest x-ray: No active cardiopulmonary disease 12/06/2015  Assessment/Plan:  Demonstrated proper injection technique with Actemra demo pen.  Patient able to demonstrate proper injection technique using the teach back method. Patient self injected in the right anterior thigh with:  Sample Medication: Actemra 162mg /0.15ml NDC: 10m Lot: [id number] Expiration: 03/2021  Patient tolerated well.  Observed for 30 mins in office for adverse  reaction and none noted.   Patient is to return in 1 month for follow-up appointment and labs.  Standing orders placed. Prescription sent to [facility] required per insurance and he is co-pay card eligible.  Patient given the information to apply for co-pay card online.  All questions encouraged and answered.  Instructed patient to call with any further questions or concerns.  Mariella Saa, PharmD, [place], CPP Clinical Specialty Pharmacist (Rheumatology and Pulmonology)  01/16/2020 9:59 AM

## 2020-01-13 NOTE — Telephone Encounter (Signed)
Called to notify patient of approval and schedule new start visit.  Patient verbalized understanding.  Last Orencia dose was 2 to 3 weeks ago.  Patient informed office that he had a DVT scan which was negative.  Advised that should not interfere with starting Actemra.  Patient scheduled for new start visit on 6/14 at 9 AM.  Verlin Fester, PharmD, BCACP, CPP Clinical Specialty Pharmacist (Rheumatology and Pulmonology)  01/13/2020 10:14 AM

## 2020-01-16 ENCOUNTER — Other Ambulatory Visit: Payer: Self-pay | Admitting: Cardiology

## 2020-01-16 ENCOUNTER — Ambulatory Visit (INDEPENDENT_AMBULATORY_CARE_PROVIDER_SITE_OTHER): Payer: 59 | Admitting: Pharmacist

## 2020-01-16 ENCOUNTER — Other Ambulatory Visit: Payer: Self-pay

## 2020-01-16 VITALS — BP 106/49 | HR 57

## 2020-01-16 DIAGNOSIS — M0579 Rheumatoid arthritis with rheumatoid factor of multiple sites without organ or systems involvement: Secondary | ICD-10-CM

## 2020-01-16 DIAGNOSIS — Z79899 Other long term (current) drug therapy: Secondary | ICD-10-CM

## 2020-01-16 MED ORDER — ACTEMRA ACTPEN 162 MG/0.9ML ~~LOC~~ SOAJ: 162.0000 mg | SUBCUTANEOUS | 0 refills | Status: DC

## 2020-01-16 MED ORDER — ACTEMRA 162 MG/0.9ML ~~LOC~~ SOSY: 162.0000 mg | PREFILLED_SYRINGE | Freq: Once | SUBCUTANEOUS | 0 refills | Status: DC

## 2020-01-16 NOTE — Patient Instructions (Addendum)
   Obtain co-pay card online at [email or web address] or call [phone]   Call [facility] pharmacy at [phone] on Wednesday to set up first shipment if you have not heard from them  Standing Labs We placed an order today for your standing lab work.    Please come back and get your standing labs in 1 month and then every 3 months  We have open lab daily Monday through Thursday from 8:30-12:30 PM and 1:30-4:30 PM and Friday from 8:30-12:30 PM and 1:30-4:00 PM at the office of Dr. Pollyann Savoy.   You may experience shorter wait times on Monday and Friday afternoons. The office is located at [address], [address], [place], [place] [address] No appointment is necessary.   Labs are drawn by [facility].  You may receive a bill from [facility] for your lab work.  If you wish to have your labs drawn at another location, please call the office 24 hours in advance to send orders.  If you have any questions regarding directions or hours of operation,  please call [phone].   Just as a reminder please drink plenty of water prior to coming for your lab work. Thanks!

## 2020-01-18 ENCOUNTER — Telehealth: Payer: Self-pay | Admitting: Rheumatology

## 2020-01-18 NOTE — Telephone Encounter (Signed)
Raynelle Fanning from [facility] left a voicemail to confirm patient is under the care of Dr. Corliss Skains.  We need approval for Gabapentin prescription since patient wants it filled with [facility].  We faxed a request and received a response that patient and/or provider were not part of the office.  Please call back at #[phone] and use reference #[id number]  Please respond within 24 hours.

## 2020-01-19 ENCOUNTER — Other Ambulatory Visit: Payer: Self-pay | Admitting: *Deleted

## 2020-01-19 MED ORDER — GABAPENTIN 300 MG PO CAPS: 300.0000 mg | ORAL_CAPSULE | Freq: Every day | ORAL | 2 refills | Status: DC

## 2020-01-19 NOTE — Telephone Encounter (Signed)
Spoke with Neysa Bonito, pharmacist at [facility], she advised they had received a fax denying prescription stating he was not a patient. When speaking with Neysa Bonito it was realized that the prescription was faxed to the incorrect Dr. Corliss Skains. Neysa Bonito was provided with fax number and will fax request.

## 2020-01-19 NOTE — Telephone Encounter (Signed)
Refill request received via fax  Last Visit: 12/19/2019 Next Visit: 02/13/2020   Okay to refill per Dr. Corliss Skains

## 2020-01-20 ENCOUNTER — Encounter: Payer: Self-pay | Admitting: Orthopaedic Surgery

## 2020-01-23 NOTE — Telephone Encounter (Signed)
Received notification from [employer] regarding a prior authorization for [employer]. Authorization has been APPROVED from 01/10/2020 to 01/08/2021.   Verlin Fester, PharmD, [place], CPP Clinical Specialty Pharmacist (Rheumatology and Pulmonology)  01/23/2020 8:35 AM

## 2020-01-24 DIAGNOSIS — S92309A Fracture of unspecified metatarsal bone(s), unspecified foot, initial encounter for closed fracture: Secondary | ICD-10-CM | POA: Insufficient documentation

## 2020-01-27 ENCOUNTER — Telehealth: Payer: Self-pay | Admitting: *Deleted

## 2020-01-27 NOTE — Telephone Encounter (Signed)
Patient is having diarrhea x 3 days, worsening. [phone]. Patient is a new start on Actemra.

## 2020-01-27 NOTE — Telephone Encounter (Signed)
Patient states that he had Actemra for shot on January 13, 2020 he started having diarrhea 3 days ago.  I told him it is unusual for diarrhea to start after so many days.  I have advised him not to take another Actemra shot until his diarrhea completely resolves.  I have also advised him to schedule an appointment with his PCP for evaluation.  Of advised to contact us if his symptoms persist.

## 2020-01-30 ENCOUNTER — Ambulatory Visit: Payer: 59 | Admitting: Physician Assistant

## 2020-01-31 ENCOUNTER — Encounter: Payer: Self-pay | Admitting: Rheumatology

## 2020-01-31 NOTE — Telephone Encounter (Signed)
I reached out to [employer].  He states that his diarrhea is improved but not completely resolved.  Advised him to schedule an appointment with his gastroenterologist as soon as possible for evaluation so that he can take the next shot of Actemra when due.

## 2020-01-31 NOTE — Progress Notes (Signed)
Office Visit Note  Patient: [name]             Date of Birth: [birth year]           MRN: [id number]             PCP: [name], MD Referring: [name], MD Visit Date: 02/13/2020 Occupation: @GUAROCC @  Subjective:  Pain in multiple joints  History of Present Illness: [name] is a 54 y.o. male with history of seropositive rheumatoid arthritis and osteoarthritis.  He reported he started having diarrhea several times a day starting around 01/25/2020.  His diarrhea has been improving.  He states that some days he will have a loose stool and other days he will have several bouts of diarrhea.  He denies any blood in his stool.  He has an upcoming appointment with Dr. 01/27/2020 his gastroenterologist tomorrow morning.  He is currently holding actemra and is awaiting further workup prior to resuming Actemra.  He was initially started on Actemra on 01/16/2020 and is concerned that the diarrhea is a potential side effects. He is currently having pain in multiple joints including both elbows, both hands, both knee joints, and the left ankle joint.  He has noticed intermittent swelling in his knees, hands, and elbows.  He has also noticed decreased grip strength.  He has an upcoming appointment with Dr. 01/18/2020 in 2 days.  According to the patient he was evaluated recently by Dr. Magnus Ivan his nephrologist and requested to start on diclofenac since his kidney function has been stable.  According to the patient Dr. Hyman Hopes wanted him to follow-up to discuss a prescription for diclofenac.    Activities of Daily Living:  Patient reports joint stiffness all day  Patient Denies nocturnal pain.  Difficulty dressing/grooming: Denies Difficulty climbing stairs: Denies Difficulty getting out of chair: Denies Difficulty using hands for taps, buttons, cutlery, and/or writing: Reports  Review of Systems  Constitutional: Positive for fatigue. Negative for night sweats.  HENT: Positive for  mouth dryness. Negative for mouth sores and nose dryness.   Eyes: Negative for redness and dryness.  Respiratory: Negative for difficulty breathing.   Cardiovascular: Positive for swelling in legs/feet. Negative for chest pain, palpitations, hypertension and irregular heartbeat.  Gastrointestinal: Positive for constipation and diarrhea.  Endocrine: Negative for increased urination.  Genitourinary: Negative for difficulty urinating and painful urination.  Musculoskeletal: Positive for arthralgias, joint pain, joint swelling, muscle weakness and morning stiffness. Negative for myalgias, muscle tenderness and myalgias.  Skin: Negative for color change, rash, hair loss, nodules/bumps, skin tightness, ulcers and sensitivity to sunlight.  Allergic/Immunologic: Positive for susceptible to infections.  Neurological: Negative for dizziness, fainting, memory loss and night sweats.  Hematological: Negative for bruising/bleeding tendency and swollen glands.  Psychiatric/Behavioral: Negative for depressed mood and sleep disturbance. The patient is not nervous/anxious.     PMFS History:  Patient Active Problem List   Diagnosis Date Noted  . PVD (peripheral vascular disease) (HCC) 10/11/2019  . Chronic venous insufficiency 10/05/2018  . Controlled type 1 diabetes mellitus with diabetic peripheral angiopathy without gangrene (HCC) 08/14/2017  . Dyslipidemia 03/26/2017  . High risk medication use 09/23/2016  . History of hypertension 09/23/2016  . History of chronic kidney disease 09/23/2016  . History of coronary artery disease 09/23/2016  . Tobacco abuse 09/23/2016  . Primary osteoarthritis of both knees 09/23/2016  . History of diabetes mellitus 09/23/2016  . 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 Chest pain with high risk for cardiac  etiology 10/29/2015  . Asymptomatic bilateral carotid artery stenosis 06/08/2014  . Pleural effusion, bilateral 06/03/2014  . Dyspnea 06/01/2014  . Diabetes (HCC)  05/03/2014  . 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/03/2014  . 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 Chronic renal disease, stage 3, moderately decreased glomerular filtration rate between 30-59 mL/min/1.73 square meter 05/03/2014  . CAD (coronary artery disease) 04/27/2014    Past Medical History:  Diagnosis Date  . Anginal pain (HCC)   . Arthritis    RA IN HANDS  . Asthma   . CAD (coronary artery disease) 04/27/2014  . Chronic renal disease, stage 3, moderately decreased glomerular filtration rate between 30-59 mL/min/1.73 square meter 05/03/2014  . Coronary artery disease   . Diabetes (HCC) 05/03/2014  . Diabetes mellitus without complication (HCC)    type 1  . Hyperlipidemia   . Left shoulder pain   . 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/03/2014  . Shortness of breath   . Sleep apnea    mild OSA, no CPAP  . Unstable angina pectoris (HCC) 04/25/2014    Family History  Problem Relation Age of [place]  . Prostate cancer Father   . Heart disease Father   . Stomach cancer Mother   . Heart disease Brother   . Asthma Daughter   . Healthy Daughter    Past Surgical History:  Procedure Laterality Date  . CARDIAC CATHETERIZATION  04/25/2014   BY DR Jacinto Halim  . CARDIAC CATHETERIZATION N/A 10/30/2015   Procedure: Left Heart Cath and Cors/Grafts Angiography;  Surgeon: Yates Decamp, MD;  [facility] INVASIVE CV LAB;  Service: Cardiovascular;  Laterality: N/A;  . CARPAL TUNNEL RELEASE    . CATARACT EXTRACTION, BILATERAL    . CORONARY ARTERY BYPASS GRAFT N/A 04/27/2014   Procedure: CORONARY ARTERY BYPASS GRAFTING on pump using left internal mammary artery to LAD coronary artery, right great saphenous vein graft to diagonal coronary artery with sequential to OM1 and circumflex coronary arteries. Right greater saphenous vein graft to posterior descending coronary artery. ;  Surgeon: Delight Ovens, MD;  Location: [facility];  Service: Open Heart Surgery;  Laterality: N  . elbow  drained Left 05/09/2019  . ENDOVEIN HARVEST OF GREATER SAPHENOUS VEIN Right 04/27/2014   Procedure: ENDOVEIN HARVEST OF GREATER SAPHENOUS VEIN;  Surgeon: Delight Ovens, MD;  Location: [facility] OR;  Service: Open Heart Surgery;  Laterality: Right;  . EXCISION ORAL TUMOR N/A 03/01/2018   Procedure: EXCISION ORAL TUMOR;  Surgeon: Christia Reading, MD;  Location: [facility];  Service: ENT;  Laterality: N/A;  . EYE SURGERY     LASER  . EYE SURGERY Bilateral    astigmatism correction   . INTRAOPERATIVE TRANSESOPHAGEAL ECHOCARDIOGRAM N/A 04/27/2014   Procedure: INTRAOPERATIVE TRANSESOPHAGEAL ECHOCARDIOGRAM;  Surgeon: Delight Ovens, MD;  Location[facility] OR;  Service: Open Heart Surgery;  Laterality: N/A;  . LEFT HEART CATHETERIZATION WITH CORONARY ANGIOGRAM N/A 04/25/2014   Procedure: LEFT HEART CATHETERIZATION WITH CORONARY ANGIOGRAM;  Surgeon: Pamella Pert, MD;  Location: [facility] CATH LAB;  Service: Cardiovascular;  Laterality: N/A;  . MOUTH SURGERY  11/15/2017   tongue surgery    Social History   Social History Narrative  . Not on file    There is no immunization history on file for this patient.   Objective: Vital Signs: BP (!) 105/51 (BP Location: Left Arm, Patient Position: Sitting, Cuff Size: Normal)   Pulse (!) 53   Resp 16   Ht 5\' 8"  (  1.727 m)   Wt 201 lb 6.4 oz (91.4 kg)   BMI 30.62 kg/m    Physical Exam Vitals and nursing note reviewed.  Constitutional:      Appearance: He is well-developed.  HENT:     Head: Normocephalic and atraumatic.  Eyes:     Conjunctiva/sclera: Conjunctivae normal.     Pupils: Pupils are equal, round, and reactive to light.  Pulmonary:     Effort: Pulmonary effort is normal.  Abdominal:     General: Bowel sounds are normal.     Palpations: Abdomen is soft.  Musculoskeletal:     Cervical back: Normal range of motion and neck supple.  Skin:    General: Skin is warm and dry.     Capillary Refill: Capillary refill takes less than 2  seconds.  Neurological:     Mental Status: He is alert and oriented to person, place, and time.  Psychiatric:        Behavior: Behavior normal.      Musculoskeletal Exam: C-spine limited range of motion with discomfort.  Shoulder joints have painful ROM bilaterally.  Tenderness of both elbow joints noted.  Nodules on the extensor surface of both elbows noted.  Tenderness of both wrist joints but no synovitis noted.  He has tenderness and synovitis of several MCP and PIP joint as described above.  Hip joints limited ROM with discomfort bilaterally.  Knee joints have good ROM bilaterally.  Warmth of the left knee joint noted.  Ankle joints have good ROM.  Tenderness of the left ankle joint noted.   CDAI Exam: CDAI Score: 21.4  Patient Global: 8 mm; Provider Global: 6 mm Swollen: 9 ; Tender: 12  Joint Exam 02/13/2020      Right  Left  Elbow   Tender   Tender  Wrist   Tender     MCP 2  Swollen Tender  Swollen Tender  MCP 3  Swollen Tender  Swollen Tender  MCP 5  Swollen Tender     PIP 2     Swollen Tender  PIP 3  Swollen Tender  Swollen Tender  Knee     Swollen   Ankle      Tender     Investigation: No additional findings.  Imaging: No results found.  Recent Labs: Lab Results  Component Value Date   WBC 10.5 12/19/2019   HGB 13.1 (L) 12/19/2019   PLT 251 12/19/2019   NA 138 12/19/2019   K 4.6 12/19/2019   CL 105 12/19/2019   CO2 25 12/19/2019   GLUCOSE 89 12/19/2019   BUN 14 12/19/2019   CREATININE 1.54 (H) 12/19/2019   BILITOT 0.3 12/19/2019   ALKPHOS 97 03/15/2018   AST 37 (H) 12/19/2019   ALT 40 12/19/2019   PROT 6.5 12/19/2019   PROT 6.5 12/19/2019   ALBUMIN 3.7 03/15/2018   CALCIUM 9.0 12/19/2019   GFRAA 58 (L) 12/19/2019   QFTBGOLDPLUS NEGATIVE 03/21/2019    Speciality Comments: Prior therapy includes: Orencia, Humira and Enbrel (inadequate response), MTX (GI upset and elevated creatinine), Arava (diarrhea). He is not a good candidate for Plaquenil due to  diabetic retinopathy and CKD stage 3. He is not a good candidate for JAK inhibitors due to CAD, PVD, and chronic venous insufficiency.   Procedures:  No procedures performed Allergies: Morphine and related and Metoprolol   Assessment / Plan:     Visit Diagnoses: Rheumatoid arthritis involving multiple sites with positive rheumatoid factor (HCC) - Inadequate response to Enbrel,  Humira, D/c MTX, Arava-SE, PLQ-retinopathy, inadequate response to Orencia.  He is not a good candidate for drug inhibitors due to CAD, PVD, and chronic venous insufficiency.  He also has chronic kidney disease stage III. He is currently experiencing pain and inflammation in multiple joints as described above.  He has been experiencing intermittent discomfort in both elbows, both wrist joints, both hands, both knees, and the left ankle.  He has noticed intermittent swelling in his hands and knee joints.  He has had difficulty with ADLs due to the discomfort and stiffness he has been experiencing.  He was previously on Orencia but had an inadequate response and was switched to Actemra 160 mg subcutaneous injections starting on 01/16/2020.  He developed diarrhea shortly after starting on Actemra and has discontinued.  He has not upcoming appointment with Dr. Bosie Clos tomorrow for further evaluation.  He will need clearance prior to restarting on Actemra.  If his diarrhea persists or worsens we will discuss other treatment options. Patient was requesting a prescription for diclofenac tablets for pain relief but we discussed the concern for worsening his chronic kidney disease.  He is followed closely by Dr. Hyman Hopes his nephrologist.  We discouraged the use of oral NSAIDs at this time. He will follow-up in the office in 2 weeks to discuss treatment options.  High risk medication use - Actemra 162 mg subcutaneous injections every 14 days-currently on hold due to experiencing diarrhea.  His first injection was on 01/16/2020 and the diarrhea  began around 01/25/2020.  He has an appointment with Dr. Bosie Clos tomorrow for further evaluation and we will continue to hold Actemra until he is cleared to resume.  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  Unchanged.  Septic olecranon bursitis of left elbow - Resolved.  Thickening of the left olecranon bursa noted.  He has been experiencing intermittent tenderness and discomfort in both elbows.  Primary osteoarthritis of both knees: He has intermittent pain in both knee joints.  Warmth of the left knee joint was noted on exam today.  According to the patient at times he has difficulty ambulating due to the discomfort and stiffness he has been experiencing.  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  He continues to follow up with Dr. Magnus Ivan.  He has an appointment on 02/15/20 to discuss treatment options from here.  Osteopenia of multiple sites - DEXA 06/09/2019 T score -1.9 right femoral neck. He is vitamin D 50,000 units by mouth once weekly.    Other medical conditions are listed as follows:  History of coronary artery disease  History of hypertension  History of chronic kidney disease  History of diabetes mellitus  Dyslipidemia  Tobacco abuse  Orders: No orders of the defined types were placed in this encounter.  No orders of the defined types were placed in this encounter.     Follow-Up Instructions: Return in about 2 weeks (around 02/27/2020) for Rheumatoid arthritis.   Gearldine Bienenstock, PA-C  Note - This record has been created using Dragon software.  Chart creation errors have been sought, but may not always  have been located. Such creation errors do not reflect on  the standard of medical care.

## 2020-01-31 NOTE — Telephone Encounter (Signed)
Called to follow-up with patient.  He was in another provider's office.  We will attempt at a later time.   Verlin Fester, PharmD, [place], CPP Clinical Specialty Pharmacist (Rheumatology and Pulmonology)  01/31/2020 10:53 AM

## 2020-01-31 NOTE — Telephone Encounter (Signed)
Please call [name] to check how he is doing.

## 2020-02-01 ENCOUNTER — Ambulatory Visit: Payer: 59 | Admitting: Orthopaedic Surgery

## 2020-02-01 NOTE — Telephone Encounter (Signed)
I left a message earlier for Dr. Bosie Clos.  He called me back and we had discussion regarding [name]'s his current situation.  Dr. Marge Duncans office will contact [name] to schedule an appointment as soon as possible.  Please advise him to hold off Actemra until he is seen by Dr. Bosie Clos.

## 2020-02-02 ENCOUNTER — Other Ambulatory Visit: Payer: Self-pay

## 2020-02-02 ENCOUNTER — Other Ambulatory Visit: Payer: Self-pay | Admitting: Rheumatology

## 2020-02-02 MED ORDER — BUPROPION HCL ER (SR) 150 MG PO TB12: 150.0000 mg | ORAL_TABLET | Freq: Every day | ORAL | 3 refills | Status: DC

## 2020-02-07 ENCOUNTER — Other Ambulatory Visit: Payer: Self-pay | Admitting: Rheumatology

## 2020-02-07 MED ORDER — GABAPENTIN 300 MG PO CAPS: 300.0000 mg | ORAL_CAPSULE | Freq: Every day | ORAL | 2 refills | Status: AC

## 2020-02-07 NOTE — Telephone Encounter (Signed)
Patient request refill on Gabapentin sent to [facility] in [place].

## 2020-02-07 NOTE — Telephone Encounter (Signed)
Patient states he does not have any Gabapentin left. Patient states he is not going to be able to receive the prescription quickly. Patient is requesting for the prescription to be sent to the local pharmacy.   Last Visit: 12/19/2019 Next Visit: 02/13/2020   Okay to refill Gabapentin?

## 2020-02-13 ENCOUNTER — Encounter: Payer: Self-pay | Admitting: Physician Assistant

## 2020-02-13 ENCOUNTER — Ambulatory Visit (INDEPENDENT_AMBULATORY_CARE_PROVIDER_SITE_OTHER): Payer: 59 | Admitting: Physician Assistant

## 2020-02-13 ENCOUNTER — Other Ambulatory Visit: Payer: Self-pay

## 2020-02-13 VITALS — BP 105/51 | HR 53 | Resp 16 | Ht 68.0 in | Wt 201.4 lb

## 2020-02-13 DIAGNOSIS — M0630 Rheumatoid nodule, unspecified site: Secondary | ICD-10-CM | POA: Diagnosis not present

## 2020-02-13 DIAGNOSIS — M71122 Other infective bursitis, left elbow: Secondary | ICD-10-CM

## 2020-02-13 DIAGNOSIS — S92355K Nondisplaced fracture of fifth metatarsal bone, left foot, subsequent encounter for fracture with nonunion: Secondary | ICD-10-CM

## 2020-02-13 DIAGNOSIS — Z79899 Other long term (current) drug therapy: Secondary | ICD-10-CM

## 2020-02-13 DIAGNOSIS — Z8639 Personal history of other endocrine, nutritional and metabolic disease: Secondary | ICD-10-CM

## 2020-02-13 DIAGNOSIS — E785 Hyperlipidemia, unspecified: Secondary | ICD-10-CM

## 2020-02-13 DIAGNOSIS — Z8679 Personal history of other diseases of the circulatory system: Secondary | ICD-10-CM

## 2020-02-13 DIAGNOSIS — M0579 Rheumatoid arthritis with rheumatoid factor of multiple sites without organ or systems involvement: Secondary | ICD-10-CM

## 2020-02-13 DIAGNOSIS — Z87448 Personal history of other diseases of urinary system: Secondary | ICD-10-CM

## 2020-02-13 DIAGNOSIS — Z720 Tobacco use: Secondary | ICD-10-CM

## 2020-02-13 DIAGNOSIS — M170 Bilateral primary osteoarthritis of knee: Secondary | ICD-10-CM

## 2020-02-13 DIAGNOSIS — M8589 Other specified disorders of bone density and structure, multiple sites: Secondary | ICD-10-CM

## 2020-02-15 ENCOUNTER — Ambulatory Visit: Payer: Self-pay

## 2020-02-15 ENCOUNTER — Encounter: Payer: Self-pay | Admitting: Orthopaedic Surgery

## 2020-02-15 ENCOUNTER — Ambulatory Visit: Payer: 59 | Admitting: Orthopaedic Surgery

## 2020-02-15 ENCOUNTER — Other Ambulatory Visit: Payer: Self-pay

## 2020-02-15 DIAGNOSIS — S92352K Displaced fracture of fifth metatarsal bone, left foot, subsequent encounter for fracture with nonunion: Secondary | ICD-10-CM

## 2020-02-15 NOTE — Progress Notes (Signed)
Office Visit Note   Patient: [name]           Date of Birth: Oct 07, 1965           MRN: [id number] Visit Date: 02/15/2020              Requested by: [name], MD [address] [place],  [place] [address] PCP: [name], MD   Assessment & Plan: Visit Diagnoses:  1. 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  This will be a takedown of the nonunion and then placement with some potential allograft bone graft and a cannulated screw.  He would then be nonweightbearing on that foot for at least 4 weeks.  I will have him out of work for 4 to 6 weeks or longer.  All questions and concerns were answered and addressed.  I discussed the risk and benefits of surgery as well.  We will work on getting this scheduled in the near future.  Follow-Up Instructions: Return for 2 weeks post-op.   Orders:  Orders Placed This Encounter  Procedures  . XR Foot Complete Left   No orders of the defined types were placed in this encounter.     Procedures: No procedures performed   Clinical Data: No additional findings.   Subjective: Chief Complaint  Patient presents with  . Left Foot - Pain  Patient comes in today for continued evaluation treatment of a left foot fifth metatarsal fracture nonunion.  At this point due to continued pain and swelling he does wish to consider surgery as an option.  We did obtain a CT scan in April of this year showing the nonunion.  We decided to rex-ray today as well.  He is still having some left ankle swelling.  He said no acute changes in medical status.  He is a diabetic but reports good control.  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  HPI  Review of Systems He currently denies any headache, chest pain, shortness of breath, fever, chills, nausea, vomiting  Objective: Vital Signs: There were no vitals taken for this  visit.  Physical Exam He is alert and orient x3 and in no acute distress Ortho Exam Examination of his left foot shows pain over the base and proximal shaft of the fifth metatarsal on the left side.  The skin is intact.  There is some slight lateral ankle swelling.  The ankle stable ligamentously with full range of motion.  He had a previous ankle injury. Specialty Comments:  No specialty comments available.  Imaging: XR Foot Complete Left  Result Date: 02/15/2020 3 views left foot show a nonunion of 5th metatarsal fracture at the diaphyseal metaphyseal junction.    PMFS History: Patient Active Problem List   Diagnosis Date Noted  . PVD (peripheral vascular disease) (HCC) 10/11/2019  . Chronic venous insufficiency 10/05/2018  . Controlled type 1 diabetes mellitus with diabetic peripheral angiopathy without gangrene (HCC) 08/14/2017  . Dyslipidemia 03/26/2017  . High risk medication use 09/23/2016  . History of hypertension 09/23/2016  . History of chronic kidney disease 09/23/2016  . History of coronary artery disease 09/23/2016  . Tobacco abuse 09/23/2016  . Primary osteoarthritis of both knees 09/23/2016  . History of diabetes mellitus 09/23/2016  . 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 Chest pain with high risk for cardiac etiology 10/29/2015  . Asymptomatic bilateral carotid  artery stenosis 06/08/2014  . Pleural effusion, bilateral 06/03/2014  . Dyspnea 06/01/2014  . Diabetes (HCC) 05/03/2014  . 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/03/2014  . 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 Chronic renal disease, stage 3, moderately decreased glomerular filtration rate between 30-59 mL/min/1.73 square meter 05/03/2014  . CAD (coronary artery disease) 04/27/2014   Past Medical History:  Diagnosis Date  . Anginal pain (HCC)   . Arthritis    RA IN HANDS  . Asthma   . CAD (coronary artery disease) 04/27/2014  . Chronic renal disease, stage 3, moderately decreased  glomerular filtration rate between 30-59 mL/min/1.73 square meter 05/03/2014  . Coronary artery disease   . Diabetes (HCC) 05/03/2014  . Diabetes mellitus without complication (HCC)    type 1  . Hyperlipidemia   . Left shoulder pain   . 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/03/2014  . Shortness of breath   . Sleep apnea    mild OSA, no CPAP  . Unstable angina pectoris (HCC) 04/25/2014    Family History  Problem Relation Age of Onset  . Prostate cancer Father   . Heart disease Father   . Stomach cancer Mother   . Heart disease Brother   . Asthma Daughter   . Healthy Daughter     Past Surgical History:  Procedure Laterality Date  . CARDIAC CATHETERIZATION  04/25/2014   BY DR Jacinto Halim  . CARDIAC CATHETERIZATION N/A 10/30/2015   Procedure: Left Heart Cath and Cors/Grafts Angiography;  Surgeon: Yates Decamp, MD;  Location: [facility] INVASIVE CV LAB;  Service: Cardiovascular;  Laterality: N/A;  . CARPAL TUNNEL RELEASE    . CATARACT EXTRACTION, BILATERAL    . CORONARY ARTERY BYPASS GRAFT N/A 04/27/2014   Procedure: CORONARY ARTERY BYPASS GRAFTING on pump using left internal mammary artery to LAD coronary artery, right great saphenous vein graft to diagonal coronary artery with sequential to OM1 and circumflex coronary arteries. Right greater saphenous vein graft to posterior descending coronary artery. ;  Surgeon: Delight Ovens, MD;  Location: [facility] OR;  Service: Open Heart Surgery;  Laterality: N  . elbow drained Left 05/09/2019  . ENDOVEIN HARVEST OF GREATER SAPHENOUS VEIN Right 04/27/2014   Procedure: ENDOVEIN HARVEST OF GREATER SAPHENOUS VEIN;  Surgeon: Delight Ovens, MD;  Location: [facility] OR;  Service: Open Heart Surgery;  Laterality: Right;  . EXCISION ORAL TUMOR N/A 03/01/2018   Procedure: EXCISION ORAL TUMOR;  Surgeon: Christia Reading, MD;  Location: [facility];  Service: ENT;  Laterality: N/A;  . EYE SURGERY     LASER  . EYE SURGERY Bilateral    astigmatism  correction   . INTRAOPERATIVE TRANSESOPHAGEAL ECHOCARDIOGRAM N/A 04/27/2014   Procedure: INTRAOPERATIVE TRANSESOPHAGEAL ECHOCARDIOGRAM;  Surgeon: Delight Ovens, MD;  Location: [facility] OR;  Service: Open Heart Surgery;  Laterality: N/A;  . LEFT HEART CATHETERIZATION WITH CORONARY ANGIOGRAM N/A 04/25/2014   Procedure: LEFT HEART CATHETERIZATION WITH CORONARY ANGIOGRAM;  Surgeon: Pamella Pert, MD;  Location: [facility] CATH LAB;  Service: Cardiovascular;  Laterality: N/A;  . MOUTH SURGERY  11/15/2017   tongue surgery    Social History   Occupational History  . Not on file  Tobacco Use  . Smoking status: Current Every Day Smoker    Packs/day: 0.50    Years: 34.00    Pack years: 17.00    Types: Cigarettes  . Smokeless tobacco: Never Used  Vaping Use  . Vaping Use: Never used  Substance and Sexual Activity  .  Alcohol use: Yes    Alcohol/week: 0.0 standard drinks    Comment: RARE  . Drug use: No  . Sexual activity: Not on file

## 2020-02-17 NOTE — Progress Notes (Addendum)
Office Visit Note  Patient: [name]             Date of Birth: [birth year]           MRN: [id number]             PCP: [name], MD Referring: [name], MD Visit Date: 02/28/2020 Occupation: @GUAROCC @  Subjective:  Left foot pain.   History of Present Illness: [name] is a 54 y.o. male with history of sero positive rheumatoid arthritis and osteoarthritis.  He was having diarrhea few weeks back and he stopped Actemra as there was a concern of the diarrhea was coming from Actemra.  He had an appointment with Dr. 59 at [facility] who evaluated the patient and felt that the diarrhea was not related to Actemra.  He has had colonoscopies in the past which were all negative.  The cause of diarrhea was not established.  Patient took his last Actemra dose about 3 days ago became he has been tolerating it well.  He still has a nonhealing fracture in his left foot for which she will be having surgery by Dr. Natrona heights in the third week of August.  He still continues to have pain and swelling in his joints.  He is having discomfort in his bilateral wrists, bilateral hands, bilateral knee and lateral feet.  He continues to have some discomfort in his left elbow.  Activities of Daily Living:  Patient reports morning stiffness for 1  hour.   Patient Reports nocturnal pain.  Difficulty dressing/grooming: Denies Difficulty climbing stairs: Reports Difficulty getting out of chair: Reports Difficulty using hands for taps, buttons, cutlery, and/or writing: Reports  Review of Systems  Constitutional: Positive for fatigue.  HENT: Positive for mouth dryness. Negative for mouth sores and nose dryness.   Eyes: Negative for itching and dryness.  Respiratory: Positive for wheezing. Negative for shortness of breath and difficulty breathing.   Cardiovascular: Negative for chest pain and palpitations.  Gastrointestinal: Negative for blood in stool, constipation and diarrhea.    Endocrine: Negative for increased urination.  Genitourinary: Negative for difficulty urinating.  Musculoskeletal: Positive for arthralgias, joint pain, joint swelling, myalgias, morning stiffness, muscle tenderness and myalgias.  Skin: Negative for color change, rash and redness.  Allergic/Immunologic: Negative for susceptible to infections.  Neurological: Positive for weakness. Negative for dizziness, numbness, headaches and memory loss.  Hematological: Negative for bruising/bleeding tendency.  Psychiatric/Behavioral: Negative for confusion.    PMFS History:  Patient Active Problem List   Diagnosis Date Noted  . Osteopenia of multiple sites 02/28/2020  . PVD (peripheral vascular disease) (HCC) 10/11/2019  . Chronic venous insufficiency 10/05/2018  . Controlled type 1 diabetes mellitus with diabetic peripheral angiopathy without gangrene (HCC) 08/14/2017  . Dyslipidemia 03/26/2017  . High risk medication use 09/23/2016  . History of hypertension 09/23/2016  . History of chronic kidney disease 09/23/2016  . History of coronary artery disease 09/23/2016  . Tobacco abuse 09/23/2016  . Primary osteoarthritis of both knees 09/23/2016  . History of diabetes mellitus 09/23/2016  . 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 Chest pain with high risk for cardiac etiology 10/29/2015  . Asymptomatic bilateral carotid artery stenosis 06/08/2014  . Pleural effusion, bilateral 06/03/2014  . Dyspnea 06/01/2014  . Diabetes (HCC) 05/03/2014  . 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/03/2014  . 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 Chronic renal disease, stage 3, moderately decreased glomerular filtration rate between 30-59 mL/min/1.73 square meter 05/03/2014  .  CAD (coronary artery disease) 04/27/2014    Past Medical History:  Diagnosis Date  . Anginal pain (HCC)   . Arthritis    RA IN HANDS  . Asthma   . CAD (coronary artery disease) 04/27/2014  . Chronic renal disease, stage 3,  moderately decreased glomerular filtration rate between 30-59 mL/min/1.73 square meter 05/03/2014  . Coronary artery disease   . Diabetes (HCC) 05/03/2014  . Diabetes mellitus without complication (HCC)    type 1  . Hyperlipidemia   . Left shoulder pain   . 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/03/2014  . Shortness of breath   . Sleep apnea    mild OSA, no CPAP  . Unstable angina pectoris (HCC) 04/25/2014    Family History  Problem Relation Age of Onset  . Prostate cancer Father   . Heart disease Father   . Stomach cancer Mother   . Heart disease Brother   . Asthma Daughter   . Healthy Daughter    Past Surgical History:  Procedure Laterality Date  . CARDIAC CATHETERIZATION  04/25/2014   BY DR Jacinto Halim  . CARDIAC CATHETERIZATION N/A 10/30/2015   Procedure: Left Heart Cath and Cors/Grafts Angiography;  Surgeon: Yates Decamp, MD;  Location: [facility] INVASIVE CV LAB;  Service: Cardiovascular;  Laterality: N/A;  . CARPAL TUNNEL RELEASE    . CATARACT EXTRACTION, BILATERAL    . CORONARY ARTERY BYPASS GRAFT N/A 04/27/2014   Procedure: CORONARY ARTERY BYPASS GRAFTING on pump using left internal mammary artery to LAD coronary artery, right great saphenous vein graft to diagonal coronary artery with sequential to OM1 and circumflex coronary arteries. Right greater saphenous vein graft to posterior descending coronary artery. ;  Surgeon: Delight Ovens, MD;  Location: [facility] OR;  Service: Open Heart Surgery;  Laterality: N  . elbow drained Left 05/09/2019  . ENDOVEIN HARVEST OF GREATER SAPHENOUS VEIN Right 04/27/2014   Procedure: ENDOVEIN HARVEST OF GREATER SAPHENOUS VEIN;  Surgeon: Delight Ovens, MD;  Location: [facility] OR;  Service: Open Heart Surgery;  Laterality: Right;  . EXCISION ORAL TUMOR N/A 03/01/2018   Procedure: EXCISION ORAL TUMOR;  Surgeon: Christia Reading, MD;  Location: [facility];  Service: ENT;  Laterality: N/A;  . EYE SURGERY     LASER  . EYE SURGERY Bilateral     astigmatism correction   . INTRAOPERATIVE TRANSESOPHAGEAL ECHOCARDIOGRAM N/A 04/27/2014   Procedure: INTRAOPERATIVE TRANSESOPHAGEAL ECHOCARDIOGRAM;  Surgeon: Delight Ovens, MD;  Location: [facility] OR;  Service: Open Heart Surgery;  Laterality: N/A;  . LEFT HEART CATHETERIZATION WITH CORONARY ANGIOGRAM N/A 04/25/2014   Procedure: LEFT HEART CATHETERIZATION WITH CORONARY ANGIOGRAM;  Surgeon: Pamella Pert, MD;  Location: [facility] CATH LAB;  Service: Cardiovascular;  Laterality: N/A;  . MOUTH SURGERY  11/15/2017   tongue surgery    Social History   Social History Narrative  . Not on file    There is no immunization history on file for this patient.   Objective: Vital Signs: BP (!) 111/55 (BP Location: Right Arm, Patient Position: Sitting, Cuff Size: Normal)   Pulse 57   Resp 15   Ht 5\' 8"  (1.727 m)   Wt 197 lb (89.4 kg)   BMI 29.95 kg/m    Physical Exam Vitals and nursing note reviewed.  Constitutional:      Appearance: He is well-developed.  HENT:     Head: Normocephalic and atraumatic.  Eyes:     Conjunctiva/sclera: Conjunctivae normal.     Pupils: Pupils are equal,  round, and reactive to light.  Cardiovascular:     Rate and Rhythm: Normal rate and regular rhythm.     Heart sounds: Normal heart sounds.  Pulmonary:     Effort: Pulmonary effort is normal.     Breath sounds: Normal breath sounds.  Abdominal:     General: Bowel sounds are normal.     Palpations: Abdomen is soft.  Musculoskeletal:     Cervical back: Normal range of motion and neck supple.  Skin:    General: Skin is warm and dry.     Capillary Refill: Capillary refill takes less than 2 seconds.  Neurological:     Mental Status: He is alert and oriented to person, place, and time.  Psychiatric:        Behavior: Behavior normal.      Musculoskeletal Exam: C-spine was in good range of motion.  Shoulder joints, elbow joints were in good range of motion.  He has some thickening on his left elbow due to previous  olecranon bursitis but no bursitis was noted.  He has MCP and PIP thickening but no synovitis was noted.  He has some tenderness over the PIP joints.  Hip joints and knee joints were in good range of motion.  He had discomfort in his left foot due to left fifth metatarsal fracture.  CDAI Exam: CDAI Score: 4.8  Patient Global: 4 mm; Provider Global: 4 mm Swollen: 0 ; Tender: 4  Joint Exam 02/28/2020      Right  Left  PIP 3   Tender   Tender  PIP 4   Tender   Tender     Investigation: No additional findings.  Imaging: XR Foot Complete Left  Result Date: 02/15/2020 3 views left foot show a nonunion of 5th metatarsal fracture at the diaphyseal metaphyseal junction.   Recent Labs: Lab Results  Component Value Date   WBC 10.5 12/19/2019   HGB 13.1 (L) 12/19/2019   PLT 251 12/19/2019   NA 138 12/19/2019   K 4.6 12/19/2019   CL 105 12/19/2019   CO2 25 12/19/2019   GLUCOSE 89 12/19/2019   BUN 14 12/19/2019   CREATININE 1.54 (H) 12/19/2019   BILITOT 0.3 12/19/2019   ALKPHOS 97 03/15/2018   AST 37 (H) 12/19/2019   ALT 40 12/19/2019   PROT 6.5 12/19/2019   PROT 6.5 12/19/2019   ALBUMIN 3.7 03/15/2018   CALCIUM 9.0 12/19/2019   GFRAA 58 (L) 12/19/2019   QFTBGOLDPLUS NEGATIVE 03/21/2019    Speciality Comments: Prior therapy includes: Orencia, Humira and Enbrel (inadequate response), MTX (GI upset and elevated creatinine), Arava (diarrhea). He is not a good candidate for Plaquenil due to diabetic retinopathy and CKD stage 3. He is not a good candidate for JAK inhibitors due to CAD, PVD, and chronic venous insufficiency.   Procedures:  No procedures performed Allergies: Morphine and related and Metoprolol   Assessment / Plan:     Visit Diagnoses: Rheumatoid arthritis involving multiple sites with positive rheumatoid factor (HCC) - Inadequate response to Enbrel, Humira, D/c MTX, Arava-SE, PLQ-retinopathy, inadequate response to Orencia.  Patient has been on Actemra since January 16, 2020.  He was concerned that it was causing diarrhea.  He had tolerated valuation by Dr. Bosie Clos and was convinced that diarrhea was not coming from Actemra.  His last dose of Actemra was 2 days ago.  He complains of joint pain but did not have much synovitis on my examination today.  I believe he is having a  good response to the medication.  High risk medication use - Actemra 162 mg subcutaneous injections every 14 days.  His labs are due in August.  He will need labs in August and then every 3 months to monitor for drug toxicity.  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  Unchanged.  Septic olecranon bursitis of left elbow - Resolved.  Primary osteoarthritis of both knees-he continues to have some discomfort.  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  He continues to follow up with Dr. Magnus Ivan.  Patient is surgery coming up with Dr.Blackman in mid August.  I have advised him to stop Actemra at least 2 weeks prior to the surgery and he can resume Actemra 1 week after the surgery if there is no infection in good healing.  Osteoporosis-patient has low T score and fragility fracture.  DEXA 06/09/2019 T score -1.9 right femoral neck.  We will obtain vitamin D level again.  He  has a nonhealing fracture.  I discussed the option of applying for Forteo or Tymlos.  Indications side effects contraindications were discussed.  Patient wants to proceed with it.  We will apply for the medication.- Plan: Parathyroid hormone, intact (no Ca), VITAMIN D 25 Hydroxy (Vit-D Deficiency, Fractures), Phosphorus, Testosterone, Serum protein electrophoresis with reflex  History of chronic kidney disease-his GFR is in the 50s.  History of diabetes mellitus  History of coronary artery disease  History of hypertension-his blood pressure is low today.  Have advised him to monitor  blood pressure closely.  Dyslipidemia  Tobacco abuse-smoking cessation was emphasized.  He has not had COVID-19 vaccine.  He has been advised to get the vaccine.  Recommendations regarding COVID-19 vaccine were given.  He also emphasized that if he is fully vaccinated he will have to continue wearing a mask, practice hand hygiene and social distancing.  I also advised him in case he develops Covid 19 infection he should contact us for recommendations regarding COVID-19 antibody due to high risk.  Orders: Orders Placed This Encounter  Procedures  . Parathyroid hormone, intact (no Ca)  . VITAMIN D 25 Hydroxy (Vit-D Deficiency, Fractures)  . Phosphorus  . Testosterone  . Serum protein electrophoresis with reflex   No orders of the defined types were placed in this encounter.   .  Follow-Up Instructions: Return in about 3 months (around 05/30/2020) for Rheumatoid arthritis.   Pollyann Savoy, MD  Note - This record has been created using Animal nutritionist.  Chart creation errors have been sought, but may not always  have been located. Such creation errors do not reflect on  the standard of medical care.

## 2020-02-28 ENCOUNTER — Ambulatory Visit (INDEPENDENT_AMBULATORY_CARE_PROVIDER_SITE_OTHER): Payer: 59 | Admitting: Rheumatology

## 2020-02-28 ENCOUNTER — Encounter: Payer: Self-pay | Admitting: Physician Assistant

## 2020-02-28 ENCOUNTER — Telehealth: Payer: Self-pay | Admitting: Pharmacist

## 2020-02-28 ENCOUNTER — Other Ambulatory Visit: Payer: Self-pay

## 2020-02-28 VITALS — BP 111/55 | HR 57 | Resp 15 | Ht 68.0 in | Wt 197.0 lb

## 2020-02-28 DIAGNOSIS — E785 Hyperlipidemia, unspecified: Secondary | ICD-10-CM

## 2020-02-28 DIAGNOSIS — Z720 Tobacco use: Secondary | ICD-10-CM

## 2020-02-28 DIAGNOSIS — M71122 Other infective bursitis, left elbow: Secondary | ICD-10-CM

## 2020-02-28 DIAGNOSIS — Z8679 Personal history of other diseases of the circulatory system: Secondary | ICD-10-CM

## 2020-02-28 DIAGNOSIS — Z8639 Personal history of other endocrine, nutritional and metabolic disease: Secondary | ICD-10-CM

## 2020-02-28 DIAGNOSIS — M0579 Rheumatoid arthritis with rheumatoid factor of multiple sites without organ or systems involvement: Secondary | ICD-10-CM | POA: Diagnosis not present

## 2020-02-28 DIAGNOSIS — M8589 Other specified disorders of bone density and structure, multiple sites: Secondary | ICD-10-CM | POA: Insufficient documentation

## 2020-02-28 DIAGNOSIS — M170 Bilateral primary osteoarthritis of knee: Secondary | ICD-10-CM

## 2020-02-28 DIAGNOSIS — Z79899 Other long term (current) drug therapy: Secondary | ICD-10-CM | POA: Diagnosis not present

## 2020-02-28 DIAGNOSIS — M0630 Rheumatoid nodule, unspecified site: Secondary | ICD-10-CM | POA: Diagnosis not present

## 2020-02-28 DIAGNOSIS — S92355K Nondisplaced fracture of fifth metatarsal bone, left foot, subsequent encounter for fracture with nonunion: Secondary | ICD-10-CM

## 2020-02-28 DIAGNOSIS — M8000XG Age-related osteoporosis with current pathological fracture, unspecified site, subsequent encounter for fracture with delayed healing: Secondary | ICD-10-CM

## 2020-02-28 DIAGNOSIS — Z87448 Personal history of other diseases of urinary system: Secondary | ICD-10-CM

## 2020-02-28 NOTE — Telephone Encounter (Signed)
Please start BIV for teriparatide (Forteo/Tymlos).  Patient has osteoporosis due to fragility fracture per DEXA on 06/09/2019 with lowest T-score -1.9 at right femoral neck.    Verlin Fester, PharmD, [place], CPP Clinical Specialty Pharmacist (Rheumatology and Pulmonology)  02/28/2020 9:03 AM

## 2020-02-28 NOTE — Patient Instructions (Addendum)
Standing Labs We placed an order today for your standing lab work.   Please have your standing labs drawn in August and every 3 months  If possible, please have your labs drawn 2 weeks prior to your appointment so that the provider can discuss your results at your appointment.  We have open lab daily Monday through Thursday from 8:30-12:30 PM and 1:30-4:30 PM and Friday from 8:30-12:30 PM and 1:30-4:00 PM at the office of Dr. Pollyann Savoy, [facility].   Please be advised, patients with office appointments requiring lab work will take precedents over walk-in lab work.  If possible, please come for your lab work on Monday and Friday afternoons, as you may experience shorter wait times. The office is located at [address], [place], [place] [address] No appointment is necessary.   Labs are drawn by [facility]. Please bring your co-pay at the time of your lab draw.  You may receive a bill from [facility] for your lab work.  If you wish to have your labs drawn at another location, please call the office 24 hours in advance to send orders.  If you have any questions regarding directions or hours of operation,  please call [phone].   As a reminder, please drink plenty of water prior to coming for your lab work. Thanks!  Vaccines You are taking a medication(s) that can suppress your immune system.  The following immunizations are recommended:  Flu annually  Covid-19  o Please continue to wear your mask, practice social distancing and hand hygiene if you are on any medications that suppress your immune system even if you are fully vaccinated. o If you are on methotrexate, Cellcept (mycophenolate), Rinvoq, Harriette Ohara, and Olumiant - Hold medication for 1 week after each vaccine dose o If you are on Orencia Subcutaneous injections - Hold medication both one week prior to and one week after the first vaccine dose (only) o If you are on Orencia IV infusions - Time vaccine  administration so that the first vaccination will occur four weeks after infusion  Pneumonia (Pneumovax 23 and Prevnar 13 spaced at least 1 year apart)  Shingrix (after age 7)  Please check with your PCP to make sure you are up to date.  If you develop COVID-19 infection please contact your PCP or our office for recommendations to get COVID-19 antibody infusion.  Please stop Actemra at least 2 weeks prior to the surgery and resume 1 week after if there is no infection.

## 2020-02-29 NOTE — Telephone Encounter (Signed)
Submitted a Prior Authorization request to [facility] for FORTEO via Cover My Meds. Will update once we receive a response.   (Key: [id number]) - [id number]

## 2020-03-02 ENCOUNTER — Other Ambulatory Visit: Payer: Self-pay | Admitting: Cardiology

## 2020-03-02 DIAGNOSIS — I10 Essential (primary) hypertension: Secondary | ICD-10-CM

## 2020-03-02 DIAGNOSIS — I2510 Atherosclerotic heart disease of native coronary artery without angina pectoris: Secondary | ICD-10-CM

## 2020-03-02 NOTE — Telephone Encounter (Signed)
Submitted a Prior Authorization request to [facility] for PROLIA via Cover My Meds. Will update once we receive a response.    (Key: [id number]) - [id number]

## 2020-03-02 NOTE — Telephone Encounter (Signed)
Received a fax regarding Prior Authorization from [facility] for FORTEO. Authorization has been DENIED because The requested medication and/or diagnosis are not a covered benefit and excluded from coverage in accordance with the terms and conditions of your plan benefit. Therefore, the request has been administratively denied.  Fax: [phone] Expedited / Urgent Fax: [phone]   [facility] to see if commercial patient's with no coverage could apply, rep states they could try but they could not guarantee, it goes by a case by case basis.  [phone]

## 2020-03-02 NOTE — Telephone Encounter (Signed)
Received response that Prolia Pa must go through the medical benefit.

## 2020-03-08 NOTE — Telephone Encounter (Signed)
Called [employer] to initiate PA for Prolia- [id number], was transferred to [facility] who handles specialty medication authorizations.  Dx code- M80.00XG  Rep Mardella Layman advised that with DX code for Osteoporosis, plan will cover injection at 100% and no authorization will be required. Supporting documents may be requested for billing purposes.  Ref# [id number] Phone# [phone]

## 2020-03-08 NOTE — Telephone Encounter (Signed)
I have changed the diagnosis to osteoporosis as he had nonhealing pathological fracture.  Thank you

## 2020-03-08 NOTE — Telephone Encounter (Signed)
Insurance denied Forteo and Prolia.  Prolia is not covered under pharmacy benefit but medical.  They will not cover unless have a diagnosis of osteoporosis.  Last Dexa stated patient had diagnosis of osteoporosis due to fragility fracture.  Can we update last office note to diagnosis of osteoporosis to submit to medical benefit for approval of Prolia?

## 2020-03-12 ENCOUNTER — Other Ambulatory Visit: Payer: Self-pay

## 2020-03-13 NOTE — Telephone Encounter (Signed)
Called to notify patient of denial of Forteo and approval for Prolia.  Advised he will receive at the hospital as it is covered 100% on his medical benefit.  Patient verbalized understanding.  He is having surgery on 8/19 and would like to revisit after his procedure.  He will call the office when he is ready to start.  He has a follow-up appointment in October and will revisit at that time.   Verlin Fester, PharmD, [place], CPP Clinical Specialty Pharmacist (Rheumatology and Pulmonology)  03/13/2020 10:36 AM

## 2020-03-16 ENCOUNTER — Other Ambulatory Visit: Payer: Self-pay

## 2020-03-16 ENCOUNTER — Encounter (HOSPITAL_BASED_OUTPATIENT_CLINIC_OR_DEPARTMENT_OTHER): Payer: Self-pay | Admitting: Orthopaedic Surgery

## 2020-03-16 NOTE — Progress Notes (Signed)
Patient's chart and cardiac studies and notes reviewed with Dr Hyacinth Meeker, [place] for [facility] without further testing.

## 2020-03-19 ENCOUNTER — Other Ambulatory Visit (HOSPITAL_COMMUNITY)
Admission: RE | Admit: 2020-03-19 | Discharge: 2020-03-19 | Disposition: A | Discharge status: Home / Self Care | Payer: 59 | Source: Ambulatory Visit | Attending: Orthopaedic Surgery | Admitting: Orthopaedic Surgery

## 2020-03-19 ENCOUNTER — Inpatient Hospital Stay (HOSPITAL_COMMUNITY): Admission: RE | Admit: 2020-03-19 | Payer: 59 | Source: Ambulatory Visit

## 2020-03-19 ENCOUNTER — Encounter (HOSPITAL_BASED_OUTPATIENT_CLINIC_OR_DEPARTMENT_OTHER)
Admission: RE | Admit: 2020-03-19 | Discharge: 2020-03-19 | Disposition: A | Discharge status: Home / Self Care | Payer: 59 | Source: Ambulatory Visit | Attending: Orthopaedic Surgery | Admitting: Orthopaedic Surgery

## 2020-03-19 ENCOUNTER — Other Ambulatory Visit: Payer: Self-pay | Admitting: Physician Assistant

## 2020-03-19 DIAGNOSIS — E1051 Type 1 diabetes mellitus with diabetic peripheral angiopathy without gangrene: Secondary | ICD-10-CM | POA: Diagnosis not present

## 2020-03-19 DIAGNOSIS — N529 Male erectile dysfunction, unspecified: Secondary | ICD-10-CM | POA: Diagnosis not present

## 2020-03-19 DIAGNOSIS — Z7982 Long term (current) use of aspirin: Secondary | ICD-10-CM | POA: Diagnosis not present

## 2020-03-19 DIAGNOSIS — N183 Chronic kidney disease, stage 3 unspecified: Secondary | ICD-10-CM | POA: Diagnosis not present

## 2020-03-19 DIAGNOSIS — Z951 Presence of aortocoronary bypass graft: Secondary | ICD-10-CM | POA: Diagnosis not present

## 2020-03-19 DIAGNOSIS — Z01812 Encounter for preprocedural laboratory examination: Secondary | ICD-10-CM | POA: Diagnosis present

## 2020-03-19 DIAGNOSIS — Z794 Long term (current) use of insulin: Secondary | ICD-10-CM | POA: Diagnosis not present

## 2020-03-19 DIAGNOSIS — Z885 Allergy status to narcotic agent status: Secondary | ICD-10-CM | POA: Diagnosis not present

## 2020-03-19 DIAGNOSIS — S92352A Displaced fracture of fifth metatarsal bone, left foot, initial encounter for closed fracture: Secondary | ICD-10-CM | POA: Diagnosis not present

## 2020-03-19 DIAGNOSIS — Z20822 Contact with and (suspected) exposure to covid-19: Secondary | ICD-10-CM | POA: Diagnosis not present

## 2020-03-19 DIAGNOSIS — G4733 Obstructive sleep apnea (adult) (pediatric): Secondary | ICD-10-CM | POA: Diagnosis not present

## 2020-03-19 DIAGNOSIS — Z79899 Other long term (current) drug therapy: Secondary | ICD-10-CM | POA: Diagnosis not present

## 2020-03-19 DIAGNOSIS — F17210 Nicotine dependence, cigarettes, uncomplicated: Secondary | ICD-10-CM | POA: Diagnosis not present

## 2020-03-19 DIAGNOSIS — Z8249 Family history of ischemic heart disease and other diseases of the circulatory system: Secondary | ICD-10-CM | POA: Diagnosis not present

## 2020-03-19 DIAGNOSIS — E1022 Type 1 diabetes mellitus with diabetic chronic kidney disease: Secondary | ICD-10-CM | POA: Diagnosis not present

## 2020-03-19 DIAGNOSIS — J449 Chronic obstructive pulmonary disease, unspecified: Secondary | ICD-10-CM | POA: Diagnosis not present

## 2020-03-19 DIAGNOSIS — I2510 Atherosclerotic heart disease of native coronary artery without angina pectoris: Secondary | ICD-10-CM | POA: Diagnosis not present

## 2020-03-19 DIAGNOSIS — X58XXXA Exposure to other specified factors, initial encounter: Secondary | ICD-10-CM | POA: Diagnosis not present

## 2020-03-19 DIAGNOSIS — M069 Rheumatoid arthritis, unspecified: Secondary | ICD-10-CM | POA: Diagnosis not present

## 2020-03-19 LAB — BASIC METABOLIC PANEL
Anion gap: 8 (ref 5–15)
BUN: 10 mg/dL (ref 6–20)
CO2: 26 mmol/L (ref 22–32)
Calcium: 8.9 mg/dL (ref 8.9–10.3)
Chloride: 105 mmol/L (ref 98–111)
Creatinine, Ser: 1.38 mg/dL — ABNORMAL HIGH (ref 0.61–1.24)
GFR calc Af Amer: >60 mL/min (ref >60)
GFR calc non Af Amer: 57 mL/min — ABNORMAL LOW (ref >60)
Glucose, Bld: 228 mg/dL — ABNORMAL HIGH (ref 70–99)
Potassium: 4.9 mmol/L (ref 3.5–5.1)
Sodium: 139 mmol/L (ref 135–145)

## 2020-03-19 LAB — SARS CORONAVIRUS 2 (TAT 6-24 HRS): SARS Coronavirus 2: NEGATIVE

## 2020-03-22 ENCOUNTER — Ambulatory Visit (HOSPITAL_COMMUNITY)
Admission: RE | Admit: 2020-03-22 | Discharge: 2020-03-22 | Disposition: A | Discharge status: Home / Self Care | Payer: 59 | Attending: Orthopaedic Surgery | Admitting: Orthopaedic Surgery

## 2020-03-22 ENCOUNTER — Encounter (HOSPITAL_BASED_OUTPATIENT_CLINIC_OR_DEPARTMENT_OTHER): Payer: Self-pay | Admitting: Orthopaedic Surgery

## 2020-03-22 ENCOUNTER — Other Ambulatory Visit: Payer: Self-pay

## 2020-03-22 ENCOUNTER — Ambulatory Visit (HOSPITAL_BASED_OUTPATIENT_CLINIC_OR_DEPARTMENT_OTHER): Payer: 59 | Admitting: Anesthesiology

## 2020-03-22 ENCOUNTER — Encounter (HOSPITAL_BASED_OUTPATIENT_CLINIC_OR_DEPARTMENT_OTHER)
Admission: RE | Disposition: A | Discharge status: Home / Self Care | Payer: Self-pay | Source: Home / Self Care | Attending: Orthopaedic Surgery

## 2020-03-22 DIAGNOSIS — X58XXXA Exposure to other specified factors, initial encounter: Secondary | ICD-10-CM | POA: Insufficient documentation

## 2020-03-22 DIAGNOSIS — N183 Chronic kidney disease, stage 3 unspecified: Secondary | ICD-10-CM | POA: Insufficient documentation

## 2020-03-22 DIAGNOSIS — N529 Male erectile dysfunction, unspecified: Secondary | ICD-10-CM | POA: Insufficient documentation

## 2020-03-22 DIAGNOSIS — Z8249 Family history of ischemic heart disease and other diseases of the circulatory system: Secondary | ICD-10-CM | POA: Insufficient documentation

## 2020-03-22 DIAGNOSIS — Z885 Allergy status to narcotic agent status: Secondary | ICD-10-CM | POA: Insufficient documentation

## 2020-03-22 DIAGNOSIS — G4733 Obstructive sleep apnea (adult) (pediatric): Secondary | ICD-10-CM | POA: Insufficient documentation

## 2020-03-22 DIAGNOSIS — S92355K Nondisplaced fracture of fifth metatarsal bone, left foot, subsequent encounter for fracture with nonunion: Secondary | ICD-10-CM | POA: Diagnosis not present

## 2020-03-22 DIAGNOSIS — Z951 Presence of aortocoronary bypass graft: Secondary | ICD-10-CM | POA: Insufficient documentation

## 2020-03-22 DIAGNOSIS — S92352A Displaced fracture of fifth metatarsal bone, left foot, initial encounter for closed fracture: Secondary | ICD-10-CM | POA: Diagnosis not present

## 2020-03-22 DIAGNOSIS — E1022 Type 1 diabetes mellitus with diabetic chronic kidney disease: Secondary | ICD-10-CM | POA: Insufficient documentation

## 2020-03-22 DIAGNOSIS — Z794 Long term (current) use of insulin: Secondary | ICD-10-CM | POA: Insufficient documentation

## 2020-03-22 DIAGNOSIS — I2510 Atherosclerotic heart disease of native coronary artery without angina pectoris: Secondary | ICD-10-CM | POA: Insufficient documentation

## 2020-03-22 DIAGNOSIS — Z79899 Other long term (current) drug therapy: Secondary | ICD-10-CM | POA: Insufficient documentation

## 2020-03-22 DIAGNOSIS — F17210 Nicotine dependence, cigarettes, uncomplicated: Secondary | ICD-10-CM | POA: Insufficient documentation

## 2020-03-22 DIAGNOSIS — Z7982 Long term (current) use of aspirin: Secondary | ICD-10-CM | POA: Insufficient documentation

## 2020-03-22 DIAGNOSIS — M069 Rheumatoid arthritis, unspecified: Secondary | ICD-10-CM | POA: Insufficient documentation

## 2020-03-22 DIAGNOSIS — J449 Chronic obstructive pulmonary disease, unspecified: Secondary | ICD-10-CM | POA: Insufficient documentation

## 2020-03-22 DIAGNOSIS — E1051 Type 1 diabetes mellitus with diabetic peripheral angiopathy without gangrene: Secondary | ICD-10-CM | POA: Insufficient documentation

## 2020-03-22 HISTORY — DX: Chronic obstructive pulmonary disease, unspecified: J44.9

## 2020-03-22 HISTORY — DX: Anxiety disorder, unspecified: F41.9

## 2020-03-22 HISTORY — PX: ORIF TOE FRACTURE: SHX5032

## 2020-03-22 HISTORY — PX: ORIF ANKLE FRACTURE: SHX5408

## 2020-03-22 LAB — GLUCOSE, CAPILLARY
Glucose-Capillary: 175 mg/dL — ABNORMAL HIGH (ref 70–99)
Glucose-Capillary: 175 mg/dL — ABNORMAL HIGH (ref 70–99)

## 2020-03-22 SURGERY — OPEN REDUCTION INTERNAL FIXATION (ORIF) METATARSAL (TOE) FRACTURE
Anesthesia: Monitor Anesthesia Care | Site: Foot | Laterality: Left

## 2020-03-22 MED ORDER — OXYCODONE HCL 5 MG PO TABS: 5.0000 mg | ORAL_TABLET | Freq: Once | ORAL | Status: DC | PRN

## 2020-03-22 MED ORDER — FENTANYL CITRATE (PF) 100 MCG/2ML IJ SOLN
INTRAMUSCULAR | Status: DC | PRN
Start: 2020-03-22 — End: 2020-03-22
  Administered 2020-03-22: 50 ug via INTRAVENOUS

## 2020-03-22 MED ORDER — FENTANYL CITRATE (PF) 100 MCG/2ML IJ SOLN: 25.0000 ug | INTRAMUSCULAR | Status: DC | PRN

## 2020-03-22 MED ORDER — MIDAZOLAM HCL 2 MG/2ML IJ SOLN
2.0000 mg | Freq: Once | INTRAMUSCULAR | Status: AC
  Administered 2020-03-22: 2 mg via INTRAVENOUS

## 2020-03-22 MED ORDER — PROPOFOL 500 MG/50ML IV EMUL
INTRAVENOUS | Status: DC | PRN
  Administered 2020-03-22: 75 ug/kg/min via INTRAVENOUS

## 2020-03-22 MED ORDER — FENTANYL CITRATE (PF) 100 MCG/2ML IJ SOLN
INTRAMUSCULAR | Status: AC
  Filled 2020-03-22: qty 2

## 2020-03-22 MED ORDER — LACTATED RINGERS IV SOLN: INTRAVENOUS | Status: DC

## 2020-03-22 MED ORDER — ONDANSETRON 4 MG PO TBDP: 4.0000 mg | ORAL_TABLET | Freq: Three times a day (TID) | ORAL | 0 refills | Status: DC | PRN

## 2020-03-22 MED ORDER — ACETAMINOPHEN 325 MG PO TABS: 325.0000 mg | ORAL_TABLET | ORAL | Status: DC | PRN

## 2020-03-22 MED ORDER — MIDAZOLAM HCL 2 MG/2ML IJ SOLN
INTRAMUSCULAR | Status: AC
  Filled 2020-03-22: qty 2

## 2020-03-22 MED ORDER — ONDANSETRON HCL 4 MG/2ML IJ SOLN
INTRAMUSCULAR | Status: AC
  Filled 2020-03-22: qty 2

## 2020-03-22 MED ORDER — ONDANSETRON HCL 4 MG/2ML IJ SOLN: 4.0000 mg | Freq: Once | INTRAMUSCULAR | Status: DC | PRN

## 2020-03-22 MED ORDER — BUPIVACAINE-EPINEPHRINE (PF) 0.5% -1:200000 IJ SOLN
INTRAMUSCULAR | Status: DC | PRN
  Administered 2020-03-22: 25 mL via PERINEURAL

## 2020-03-22 MED ORDER — MEPERIDINE HCL 25 MG/ML IJ SOLN: 6.2500 mg | INTRAMUSCULAR | Status: DC | PRN

## 2020-03-22 MED ORDER — ACETAMINOPHEN 10 MG/ML IV SOLN: 1000.0000 mg | Freq: Once | INTRAVENOUS | Status: DC | PRN

## 2020-03-22 MED ORDER — LIDOCAINE-EPINEPHRINE (PF) 1.5 %-1:200000 IJ SOLN
INTRAMUSCULAR | Status: DC | PRN
  Administered 2020-03-22: 5 mL via PERINEURAL

## 2020-03-22 MED ORDER — ONDANSETRON HCL 4 MG/2ML IJ SOLN
INTRAMUSCULAR | Status: DC | PRN
  Administered 2020-03-22: 4 mg via INTRAVENOUS

## 2020-03-22 MED ORDER — LIDOCAINE 2% (20 MG/ML) 5 ML SYRINGE
INTRAMUSCULAR | Status: AC
  Filled 2020-03-22: qty 5

## 2020-03-22 MED ORDER — OXYCODONE-ACETAMINOPHEN 5-325 MG PO TABS: 1.0000 | ORAL_TABLET | Freq: Four times a day (QID) | ORAL | 0 refills | Status: DC | PRN

## 2020-03-22 MED ORDER — ACETAMINOPHEN 160 MG/5ML PO SOLN: 325.0000 mg | ORAL | Status: DC | PRN

## 2020-03-22 MED ORDER — CEFAZOLIN SODIUM-DEXTROSE 2-4 GM/100ML-% IV SOLN
INTRAVENOUS | Status: AC
  Filled 2020-03-22: qty 100

## 2020-03-22 MED ORDER — OXYCODONE HCL 5 MG/5ML PO SOLN: 5.0000 mg | Freq: Once | ORAL | Status: DC | PRN

## 2020-03-22 MED ORDER — PROPOFOL 500 MG/50ML IV EMUL
INTRAVENOUS | Status: AC
  Filled 2020-03-22: qty 50

## 2020-03-22 MED ORDER — FENTANYL CITRATE (PF) 100 MCG/2ML IJ SOLN
100.0000 ug | Freq: Once | INTRAMUSCULAR | Status: AC
  Administered 2020-03-22: 50 ug via INTRAVENOUS

## 2020-03-22 MED ORDER — MIDAZOLAM HCL 5 MG/5ML IJ SOLN
INTRAMUSCULAR | Status: DC | PRN
  Administered 2020-03-22: 1 mg via INTRAVENOUS

## 2020-03-22 MED ORDER — CEFAZOLIN SODIUM-DEXTROSE 2-4 GM/100ML-% IV SOLN
2.0000 g | INTRAVENOUS | Status: AC
  Administered 2020-03-22: 2 g via INTRAVENOUS

## 2020-03-22 SURGICAL SUPPLY — 69 items
BIT DRILL 2.4 AO COUPLING CANN (BIT) ×4 IMPLANT
BLADE SURG 15 STRL LF DISP TIS (BLADE) ×4 IMPLANT
BLADE SURG 15 STRL SS (BLADE) ×8
BNDG CMPR 9X4 STRL LF SNTH (GAUZE/BANDAGES/DRESSINGS)
BNDG COHESIVE 3X5 TAN STRL LF (GAUZE/BANDAGES/DRESSINGS) IMPLANT
BNDG CONFORM 3 STRL LF (GAUZE/BANDAGES/DRESSINGS) ×4 IMPLANT
BNDG ELASTIC 3X5.8 VLCR STR LF (GAUZE/BANDAGES/DRESSINGS) ×4 IMPLANT
BNDG ELASTIC 4X5.8 VLCR STR LF (GAUZE/BANDAGES/DRESSINGS) ×4 IMPLANT
BNDG ESMARK 4X9 LF (GAUZE/BANDAGES/DRESSINGS) IMPLANT
BNDG GAUZE ELAST 4 BULKY (GAUZE/BANDAGES/DRESSINGS) ×4 IMPLANT
BONE CHIP PRESERV 5CC PCAN5 (Bone Implant) ×4 IMPLANT
CANISTER SUCT 1200ML W/VALVE (MISCELLANEOUS) ×4 IMPLANT
CLOSURE WOUND 1/2 X4 (GAUZE/BANDAGES/DRESSINGS) ×1
CORD BIPOLAR FORCEPS 12FT (ELECTRODE) ×4 IMPLANT
COVER BACK TABLE 60X90IN (DRAPES) ×4 IMPLANT
COVER MAYO STAND STRL (DRAPES) ×4 IMPLANT
COVER WAND RF STERILE (DRAPES) IMPLANT
DECANTER SPIKE VIAL GLASS SM (MISCELLANEOUS) IMPLANT
DRAIN TLS ROUND 10FR (DRAIN) IMPLANT
DRAPE EXTREMITY T 121X128X90 (DISPOSABLE) ×4 IMPLANT
DRAPE SURG 17X23 STRL (DRAPES) IMPLANT
DRAPE U-SHAPE 47X51 STRL (DRAPES) ×4 IMPLANT
DRSG EMULSION OIL 3X3 NADH (GAUZE/BANDAGES/DRESSINGS) ×4 IMPLANT
DRSG PAD ABDOMINAL 8X10 ST (GAUZE/BANDAGES/DRESSINGS) ×4 IMPLANT
DURAPREP 26ML APPLICATOR (WOUND CARE) ×4 IMPLANT
GAUZE 4X4 16PLY RFD (DISPOSABLE) IMPLANT
GAUZE XEROFORM 1X8 LF (GAUZE/BANDAGES/DRESSINGS) ×4 IMPLANT
GLOVE BIO SURGEON STRL SZ7.5 (GLOVE) ×4 IMPLANT
GLOVE BIOGEL PI IND STRL 8 (GLOVE) ×2 IMPLANT
GLOVE BIOGEL PI INDICATOR 8 (GLOVE) ×2
GOWN STRL REUS W/ TWL LRG LVL3 (GOWN DISPOSABLE) ×2 IMPLANT
GOWN STRL REUS W/ TWL XL LVL3 (GOWN DISPOSABLE) ×2 IMPLANT
GOWN STRL REUS W/TWL LRG LVL3 (GOWN DISPOSABLE) ×4
GOWN STRL REUS W/TWL XL LVL3 (GOWN DISPOSABLE) ×4
K-WIRE TROC 1.25X150 (WIRE) ×4
KWIRE TROC 1.25X150 (WIRE) ×2 IMPLANT
NEEDLE HYPO 22GX1.5 SAFETY (NEEDLE) IMPLANT
NEEDLE HYPO 25X1 1.5 SAFETY (NEEDLE) ×4 IMPLANT
NS IRRIG 1000ML POUR BTL (IV SOLUTION) ×4 IMPLANT
PACK BASIN DAY SURGERY FS (CUSTOM PROCEDURE TRAY) ×4 IMPLANT
PAD ALCOHOL SWAB (MISCELLANEOUS) IMPLANT
PAD CAST 3X4 CTTN HI CHSV (CAST SUPPLIES) ×4 IMPLANT
PAD CAST 4YDX4 CTTN HI CHSV (CAST SUPPLIES) ×2 IMPLANT
PADDING CAST ABS 3INX4YD NS (CAST SUPPLIES) ×2
PADDING CAST ABS 4INX4YD NS (CAST SUPPLIES) ×2
PADDING CAST ABS COTTON 3X4 (CAST SUPPLIES) ×2 IMPLANT
PADDING CAST ABS COTTON 4X4 ST (CAST SUPPLIES) ×2 IMPLANT
PADDING CAST COTTON 3X4 STRL (CAST SUPPLIES) ×8
PADDING CAST COTTON 4X4 STRL (CAST SUPPLIES) ×4
PUTTY DBM STAGRAFT 2CC (Putty) ×4 IMPLANT
SCREW CANN 4.0X50 (Screw) ×4 IMPLANT
SCREW CANN PT 50X4 NS SM (Screw) ×2 IMPLANT
SHEET MEDIUM DRAPE 40X70 STRL (DRAPES) IMPLANT
SPLINT PLASTER CAST XFAST 3X15 (CAST SUPPLIES) IMPLANT
SPLINT PLASTER CAST XFAST 4X15 (CAST SUPPLIES) IMPLANT
SPLINT PLASTER XTRA FAST SET 4 (CAST SUPPLIES)
SPLINT PLASTER XTRA FASTSET 3X (CAST SUPPLIES)
STOCKINETTE 4X48 STRL (DRAPES) IMPLANT
STRIP CLOSURE SKIN 1/2X4 (GAUZE/BANDAGES/DRESSINGS) ×3 IMPLANT
SUCTION FRAZIER HANDLE 10FR (MISCELLANEOUS) ×4
SUCTION TUBE FRAZIER 10FR DISP (MISCELLANEOUS) ×2 IMPLANT
SUT ETHILON 3 0 PS 1 (SUTURE) ×8 IMPLANT
SUT PROLENE 4 0 PS 2 18 (SUTURE) ×4 IMPLANT
SYR BULB EAR ULCER 3OZ GRN STR (SYRINGE) ×4 IMPLANT
SYR CONTROL 10ML LL (SYRINGE) ×8 IMPLANT
TOWEL GREEN STERILE FF (TOWEL DISPOSABLE) ×8 IMPLANT
TUBE CONNECTING 20'X1/4 (TUBING) ×1
TUBE CONNECTING 20X1/4 (TUBING) ×3 IMPLANT
UNDERPAD 30X36 HEAVY ABSORB (UNDERPADS AND DIAPERS) ×4 IMPLANT

## 2020-03-22 NOTE — Progress Notes (Signed)
Assisted Dr. [name] with left, ultrasound guided, popliteal block. Side rails up, monitors on throughout procedure. See vital signs in flow sheet. Tolerated Procedure well. 

## 2020-03-22 NOTE — Anesthesia Preprocedure Evaluation (Addendum)
Anesthesia Evaluation  Patient identified by MRN, date of birth, ID band Patient awake    Reviewed: Allergy & Precautions, NPO status , Patient's Chart, lab work & pertinent test results  History of Anesthesia Complications (+) DIFFICULT AIRWAYNegative for: history of anesthetic complications  Airway Mallampati: III  TM Distance: >3 FB Neck ROM: Full    Dental  (+) Dental Advisory Given, Teeth Intact   Pulmonary neg shortness of breath, asthma , sleep apnea , COPD,  COPD inhaler, Current Smoker and Patient abstained from smoking.,    breath sounds clear to auscultation       Cardiovascular (-) angina+ CAD, + CABG and + Peripheral Vascular Disease   Rhythm:Regular  There is a small sized mild reversible mild defect in the lateral region.  Compared to the study done on 10/12/2015, large inferolateral ischemia not present. Defect is consistent with known occluded distal circumflex coronary artery. Stress LV EF: 62%.  Intermediate risk study.     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   Abdominal   Peds  Hematology   Anesthesia Other Findings   Reproductive/Obstetrics                            Anesthesia Physical Anesthesia Plan  ASA: III  Anesthesia Plan: MAC and Regional   Post-op Pain Management:    Induction: Intravenous  PONV Risk Score and Plan: 0 and Propofol infusion and Treatment may vary due to age or medical condition  Airway Management Planned: Nasal Cannula  Additional Equipment: None  Intra-op Plan:   Post-operative Plan:   Informed Consent: I have reviewed the patients History and Physical, chart, labs and discussed the procedure including the risks, benefits and alternatives for the proposed anesthesia with the patient or  authorized representative who has indicated his/her understanding and acceptance.     Dental advisory given  Plan Discussed with: CRNA and Surgeon  Anesthesia Plan Comments:         Anesthesia Quick Evaluation

## 2020-03-22 NOTE — Brief Op Note (Signed)
03/22/2020  11:52 AM  PATIENT:  [name]  54 y.o. 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   Delay start of Pharmacological VTE agent (>24hrs) due to surgical blood loss or risk of bleeding: no

## 2020-03-22 NOTE — Anesthesia Postprocedure Evaluation (Signed)
Anesthesia Post Note  Patient: [name]  Procedure(s) Performed: OPEN REDUCTION INTERNAL FIXATION (ORIF) Non Union 5th Metatarsal (Left Foot)     Patient location during evaluation: Phase II Anesthesia Type: Regional and MAC Level of consciousness: awake Pain management: pain level controlled Vital Signs Assessment: post-procedure vital signs reviewed and stable Respiratory status: spontaneous breathing Cardiovascular status: stable Postop Assessment: no apparent nausea or vomiting Anesthetic complications: no   No complications documented.  Last Vitals:  Vitals:   03/22/20 1230 03/22/20 1300  BP: (!) 107/50 (!) 149/61  Pulse: (!) 58 (!) 57  Resp: 15 16  Temp:  36.4 C  SpO2: 98% 99%    Last Pain:  Vitals:   03/22/20 0910  TempSrc: Oral  PainSc: 0-No pain    LLE Motor Response: No movement due to regional block (03/22/20 1300) LLE Sensation: Decreased (regional block) (03/22/20 1300)          [name]

## 2020-03-22 NOTE — Anesthesia Procedure Notes (Signed)
Anesthesia Regional Block: Popliteal block   Pre-Anesthetic Checklist: ,, timeout performed, Correct Patient, Correct Site, Correct Laterality, Correct Procedure, Correct Position, site marked, Risks and benefits discussed,  Surgical consent,  Pre-op evaluation,  At surgeon's request and post-op pain management  Laterality: Left and Lower  Prep: chloraprep       Needles:  Injection technique: Single-shot     Needle Length: 9cm  Needle Gauge: 22     Additional Needles: Arrow StimuQuik ECHO Echogenic Stimulating PNB Needle  Procedures:,,,, ultrasound used (permanent image in chart),,,,  Narrative:  Start time: 03/22/2020 10:31 AM End time: 03/22/2020 10:36 AM Injection made incrementally with aspirations every 5 mL.  Performed by: Personally  Anesthesiologist: Val Eagle, MD

## 2020-03-22 NOTE — Transfer of Care (Signed)
Immediate Anesthesia Transfer of Care Note  Patient: [name]  Procedure(s) Performed: OPEN REDUCTION INTERNAL FIXATION (ORIF) Non Union 5th Metatarsal (Left Foot)  Patient Location: PACU  Anesthesia Type:MAC and Regional  Level of Consciousness: drowsy, patient cooperative and responds to stimulation  Airway & Oxygen Therapy: Patient Spontanous Breathing and Patient connected to face mask oxygen  Post-op Assessment: Report given to RN and Post -op Vital signs reviewed and stable  Post vital signs: Reviewed and stable  Last Vitals:  Vitals Value Taken Time  BP    Temp    Pulse 62 03/22/20 1159  Resp 10 03/22/20 1159  SpO2 100 % 03/22/20 1159  Vitals shown include unvalidated device data.  Last Pain:  Vitals:   03/22/20 0910  TempSrc: Oral  PainSc: 0-No pain         Complications: No complications documented.

## 2020-03-22 NOTE — Op Note (Signed)
NAME: Kreitz, Weiland J. MEDICAL RECORD ZO:[id number] ACCOUNT [id number] DATE OF BIRTH:1966-05-02 FACILITY: [facility] LOCATION: [facility] PHYSICIAN:Ammi Hutt Aretha Parrot, MD  OPERATIVE REPORT  DATE OF PROCEDURE:  03/22/2020  PREOPERATIVE DIAGNOSIS:  Left fifth metatarsal fracture nonunion.  POSTOPERATIVE DIAGNOSIS:  Left fifth metatarsal fracture nonunion.  PROCEDURES: 1.  Takedown of nonunion left fifth metatarsal Jones fracture. 2.  Open reduction internal fixation and allograft bone grafting of left fifth metatarsal fracture.  IMPLANTS:  A single 4.0 partially threaded cancellous screw.  SURGEON:  Vanita Panda. Magnus Ivan, MD  ASSISTANT:  Richardean Canal, PA-C  ANESTHESIA:   1.  Lower extremity popliteal block. 2.  Mask ventilation and IV sedation.  TOURNIQUET TIME:  Under 1 hour.  ESTIMATED BLOOD LOSS:  Minimal.  COMPLICATIONS:  None.  INDICATIONS:  The patient is a 54 year old gentleman who sustained a left foot fifth metatarsal base fracture really at the metaphyseal-diaphyseal junction, which is a true Jones fracture.  This was last year.  He is diabetic and does have some other  medical issues.  He has eventually gone on to not heal this fracture.  He does have pain in his left foot and we verified the fracture nonunion with plain films and a CT scan.  At this point, we have recommended taking down the nonunion with supplemental  allograft bone grafting and placing a screw in the fracture in the fifth metatarsal as well.  The risks and benefits of surgery have been explained to him in detail and he does wish to proceed given the fracture nonunion and the continued pain he is  having.  I did have a long and thorough discussion about the risks and benefits of the surgery, as well as goals.  DESCRIPTION OF PROCEDURE:  After informed consent was obtained, the appropriate left foot was marked.  A popliteal block was obtained in the holding room by anesthesia.  He was then  brought to the operating room and placed on the operating table.  A  nonsterile tourniquet was placed on his upper left thigh and his left knee, leg, ankle, foot were prepped and draped with DuraPrep and sterile drapes.  A time-out was called to identify correct patient, correct left foot.  We then used an Esmarch to wrap  that leg and tourniquet was inflated to 250 mm of pressure.  I then made an incision over the lateral aspect of the foot at the fifth metatarsal and carried this proximally and distally.  We were able to dissect down to the fracture nonunion site and I  verified this under fluoroscopy.  Due to significant fibrous tissue and nonunion callus, it was difficult at first to see this, but I was able to easily and then take it down.  I curetted out both ends of the nonunion site.  Then under direct  fluoroscopic guidance, we placed a guide pin at the tip of the base of the fifth metatarsal and carried this into the diaphysis, well past the fracture site.  I took a measurement off of this and we chose a 4.0 cancellous screw partially threaded and 50  mm in length.  We were able to place this easily over the guide pin and then I removed the guide pin.  We verified the placement of the screw under direct fluoroscopy in AP and lateral planes and oblique planes to make sure it was in the fifth metatarsal  and not broaching the cortex.  I then curetted out the fracture site again and then we placed supplemental  bone graft with cancellous chips and DBX putty.  After this, we closed the deep tissue with 2-0 Vicryl, followed by interrupted 3-0 nylon in the  skin incision.  Xeroform well-padded sterile dressing was applied.  The tourniquet was let down.  Toes pinked in nicely.  He was taken to recovery room in stable condition with all final counts being correct.  No complications noted.    Postoperatively, we will have him nonweightbearing on that foot for the next 4-6 weeks.  We will see him back in the  office in 2 weeks.  VN/NUANCE  D:03/22/2020 T:03/22/2020 JOB:[id number]

## 2020-03-22 NOTE — Anesthesia Procedure Notes (Signed)
Date/Time: 03/22/2020 10:52 AM Performed by: Thornell Mule, CRNA Oxygen Delivery Method: Simple face mask

## 2020-03-22 NOTE — H&P (Signed)
[name] is an 54 y.o. male.   Chief Complaint:   Left foot pain HPI:   The patient is a 54 year old gentleman with a known fifth metatarsal fracture nonunion at the metaphyseal diaphyseal junction.  We have tried to treat this conservatively and nonoperatively for well over 6 months.  His x-rays and CT scan of the left foot show persistent gap of the fracture.  At this point we recommended takedown nonunion with bone grafting and placement of the cannulated screw through the metatarsal.  I showed them a foot model explained in detail the recommendation.  Given the fact that he has not been unable to heal this fracture and has still persistent pain in spite of conservative treatment with time and even bone stimulation we are recommending surgery at this standpoint.  Past Medical History:  Diagnosis Date  . Anginal pain ([place])   . Anxiety   . Arthritis    RA IN HANDS  . Asthma   . CAD (coronary artery disease) 04/27/2014  . Chronic renal disease, stage 3, moderately decreased glomerular filtration rate between 30-59 mL/min/1.73 square meter 05/03/2014  . COPD (chronic obstructive pulmonary disease) ([place])   . Coronary artery disease   . Diabetes ([place]) 05/03/2014  . Diabetes mellitus without complication ([place])    type 1  . Hyperlipidemia   . Left shoulder pain   . Rheumatoid arthritis involving multiple joints ([place]) 05/03/2014  . Shortness of breath   . Sleep apnea    mild OSA, no CPAP  . Unstable angina pectoris ([place]) 04/25/2014    Past Surgical History:  Procedure Laterality Date  . CARDIAC CATHETERIZATION  04/25/2014   BY DR Einar Gip  . CARDIAC CATHETERIZATION N/A 10/30/2015   Procedure: Left Heart Cath and Cors/Grafts Angiography;  Surgeon: Adrian Prows, MD;  Location: [place] [facility];  Service: Cardiovascular;  Laterality: N/A;  . CARPAL TUNNEL RELEASE    . CATARACT EXTRACTION, BILATERAL    . CORONARY ARTERY BYPASS GRAFT N/A 04/27/2014   Procedure: CORONARY ARTERY BYPASS GRAFTING on  pump using left internal mammary artery to LAD coronary artery, right great saphenous vein graft to diagonal coronary artery with sequential to OM1 and circumflex coronary arteries. Right greater saphenous vein graft to posterior descending coronary artery. ;  Surgeon: Grace Isaac, MD;  Location: [place];  Service: Open Heart Surgery;  Laterality: N  . elbow drained Left 05/09/2019  . ENDOVEIN HARVEST OF GREATER SAPHENOUS VEIN Right 04/27/2014   Procedure: ENDOVEIN HARVEST OF GREATER SAPHENOUS VEIN;  Surgeon: Grace Isaac, MD;  Location: [place];  Service: Open Heart Surgery;  Laterality: Right;  . EXCISION ORAL TUMOR N/A 03/01/2018   Procedure: EXCISION ORAL TUMOR;  Surgeon: Melida Quitter, MD;  Location: [place];  Service: ENT;  Laterality: N/A;  . EYE SURGERY     LASER  . EYE SURGERY Bilateral    astigmatism correction   . INTRAOPERATIVE TRANSESOPHAGEAL ECHOCARDIOGRAM N/A 04/27/2014   Procedure: INTRAOPERATIVE TRANSESOPHAGEAL ECHOCARDIOGRAM;  Surgeon: Grace Isaac, MD;  Location: [place];  Service: Open Heart Surgery;  Laterality: N/A;  . LEFT HEART CATHETERIZATION WITH CORONARY ANGIOGRAM N/A 04/25/2014   Procedure: LEFT HEART CATHETERIZATION WITH CORONARY ANGIOGRAM;  Surgeon: Laverda Page, MD;  Location: [facility] CATH [facility];  Service: Cardiovascular;  Laterality: N/A;  . MOUTH SURGERY  11/15/2017   tongue surgery     Family History  Problem Relation Age of Onset  . Prostate cancer Father   . Heart disease  Father   . Stomach cancer Mother   . Heart disease Brother   . Asthma Daughter   . Healthy Daughter    Social History:  reports that he has been smoking cigarettes. He has a 34.00 pack-year smoking history. He has never used smokeless tobacco. He reports current alcohol use. He reports that he does not use drugs.  Allergies:  Allergies  Allergen Reactions  . Morphine And Related Nausea And Vomiting  . Metoprolol Other (See Comments)    Diarrhea     Medications Prior to Admission  Medication Sig Dispense Refill  . albuterol (VENTOLIN HFA) 108 (90 Base) MCG/ACT inhaler Inhale 2 puffs into the lungs every 6 (six) hours as needed for wheezing.     Marland Kitchen amLODipine (NORVASC) 5 MG tablet Take 1 tablet (5 mg total) by mouth daily. 90 tablet 2  . ANORO ELLIPTA 62.5-25 MCG/INH AEPB Inhale 1 puff into the lungs daily as needed for wheezing.    . Ascorbic Acid (VITAMIN C) 1000 MG tablet Take 1,000 mg by mouth daily.    Marland Kitchen aspirin 81 MG tablet Take 81 mg by mouth daily.    . bisoprolol (ZEBETA) 5 MG tablet Take 1 tablet by mouth once daily (Patient taking differently: Take 5 mg by mouth daily. ) 30 tablet 0  . buPROPion (WELLBUTRIN SR) 150 MG 12 hr tablet Take 1 tablet (150 mg total) by mouth daily. 90 tablet 3  . Cholecalciferol (VITAMIN D3) 125 MCG (5000 UT) TABS Take 5,000 mg by mouth daily.     . Cyanocobalamin (VITAMIN B-12) 500 MCG LOZG Take 500 mg by mouth daily.     Marland Kitchen gabapentin (NEURONTIN) 300 MG capsule Take 1 capsule (300 mg total) by mouth at bedtime. 30 capsule 2  . HUMALOG 100 UNIT/ML injection Inject 10-20 Units into the skin 4 (four) times daily. Sliding scale  Depending on carb intake    . insulin glargine (LANTUS) 100 UNIT/ML injection Inject 14-20 Units into the skin at bedtime. Take 20 units in the morning and 14 unit at bedtime    . magnesium gluconate (MAGONATE) 500 MG tablet Take 500 mg by mouth daily.     Marland Kitchen oxymetazoline (AFRIN) 0.05 % nasal spray Place 2 sprays into both nostrils at bedtime.    . sildenafil (REVATIO) 20 MG tablet Take 20 mg by mouth daily.    . tadalafil (CIALIS) 20 MG tablet Take 20 mg by mouth daily as needed for erectile dysfunction.     . Tocilizumab (ACTEMRA ACTPEN) 162 MG/0.9ML SOAJ Inject 162 mg into the skin every 14 (fourteen) days. 5.4 mL 0  . Blood Glucose Monitoring Suppl (ONETOUCH VERIO FLEX SYSTEM) w/Device KIT USE TO CHECK BLOOD GLUCOSE UP TO 10 TIMES PER DAY    . Continuous Blood Gluc Sensor  (FREESTYLE LIBRE 14 DAY SENSOR) MISC CHANGE EVERY 14 DAYS TO MONITOR BLOOD GLUCOSE    . Continuous Blood Gluc Sensor (FREESTYLE LIBRE 14 DAY SENSOR) MISC CHANGE EVERY 14 DAYS TO MONITOR BLOOD GLUOSE    . glucose blood (ACCU-CHEK AVIVA PLUS) test strip     . nitroGLYCERIN (NITROSTAT) 0.4 MG SL tablet DISSOLVE 1 TABLET UNDER THE TONGUE EVERY 5 MINUTES AS  NEEDED FOR CHEST PAIN. MAX  OF 3 TABLETS IN 15 MINUTES. CALL 911 IF PAIN PERSISTS. (Patient taking differently: Place 0.4 mg under the tongue every 5 (five) minutes as needed for chest pain. ) 100 tablet 3    Results for orders placed or performed during the hospital  encounter of 03/22/20 (from the past 48 hour(s))  Glucose, capillary     Status: Abnormal   Collection Time: 03/22/20  9:19 AM  Result Value Ref Range   Glucose-Capillary 175 (H) 70 - 99 mg/dL    Comment: Glucose reference range applies only to samples taken after fasting for at least 8 hours.   No results found.  Review of Systems  All other systems reviewed and are negative.   Blood pressure (!) 145/62, pulse 63, temperature 97.8 F (36.6 C), temperature source Oral, resp. rate 18, height 5' 8"  (1.727 m), weight 88.9 kg, SpO2 100 %. Physical Exam Vitals reviewed.  Constitutional:      Appearance: Normal appearance.  HENT:     Head: Normocephalic and atraumatic.  Eyes:     Extraocular Movements: Extraocular movements intact.     Pupils: Pupils are equal, round, and reactive to light.  Cardiovascular:     Rate and Rhythm: Normal rate.     Pulses: Normal pulses.  Pulmonary:     Effort: Pulmonary effort is normal.  Abdominal:     Palpations: Abdomen is soft.  Musculoskeletal:     Cervical back: Normal range of motion.     Left foot: Swelling, tenderness and bony tenderness present.       Feet:  Neurological:     Mental Status: He is alert and oriented to person, place, and time.  Psychiatric:        Behavior: Behavior normal.      Assessment/Plan Left foot  fifth metatarsal fracture nonunion  Our plan is to proceed to surgery today for begin taking on the nonunion and placing bone graft and a cannulated screw.  I had a long thorough discussion about the risk and benefits of surgery and what to expect in the interoperative and postoperative course.  All question concerns were answered and addressed.  The left foot has been marked and informed consent obtained.  Mcarthur Rossetti, MD 03/22/2020, 10:37 AM

## 2020-03-22 NOTE — Discharge Instructions (Signed)
Expect left foot pain and swelling -ice and elevation intermittently as needed. Slowly increase her activities as comfort allows. Do not put any weight on your left foot until further notice. Keep your dressings clean and dry. You may actually remove your dressings in 7 days.  Do expect some bloody drainage. In 7 days you can get your incision wet once daily in the shower.  Then dry off the incision and place a new dry dressing daily.   Post Anesthesia Home Care Instructions  Activity: Get plenty of rest for the remainder of the day. A responsible individual must stay with you for 24 hours following the procedure.  For the next 24 hours, DO NOT: -Drive a car -Advertising copywriter -Drink alcoholic beverages -Take any medication unless instructed by your physician -Make any legal decisions or sign important papers.  Meals: Start with liquid foods such as gelatin or soup. Progress to regular foods as tolerated. Avoid greasy, spicy, heavy foods. If nausea and/or vomiting occur, drink only clear liquids until the nausea and/or vomiting subsides. Call your physician if vomiting continues.  Special Instructions/Symptoms: Your throat may feel dry or sore from the anesthesia or the breathing tube placed in your throat during surgery. If this causes discomfort, gargle with warm salt water. The discomfort should disappear within 24 hours.  If you had a scopolamine patch placed behind your ear for the management of post- operative nausea and/or vomiting:  1. The medication in the patch is effective for 72 hours, after which it should be removed.  Wrap patch in a tissue and discard in the trash. Wash hands thoroughly with soap and water. 2. You may remove the patch earlier than 72 hours if you experience unpleasant side effects which may include dry mouth, dizziness or visual disturbances. 3. Avoid touching the patch. Wash your hands with soap and water after contact with the patch.    Regional  Anesthesia Blocks  1. Numbness or the inability to move the "blocked" extremity may last from 3-48 hours after placement. The length of time depends on the medication injected and your individual response to the medication. If the numbness is not going away after 48 hours, call your surgeon.  2. The extremity that is blocked will need to be protected until the numbness is gone and the  Strength has returned. Because you cannot feel it, you will need to take extra care to avoid injury. Because it may be weak, you may have difficulty moving it or using it. You may not know what position it is in without looking at it while the block is in effect.  3. For blocks in the legs and feet, returning to weight bearing and walking needs to be done carefully. You will need to wait until the numbness is entirely gone and the strength has returned. You should be able to move your leg and foot normally before you try and bear weight or walk. You will need someone to be with you when you first try to ensure you do not fall and possibly risk injury.  4. Bruising and tenderness at the needle site are common side effects and will resolve in a few days.  5. Persistent numbness or new problems with movement should be communicated to the surgeon or the [facility] [phone] [facility] [phone]).

## 2020-03-23 ENCOUNTER — Encounter (HOSPITAL_BASED_OUTPATIENT_CLINIC_OR_DEPARTMENT_OTHER): Payer: Self-pay | Admitting: Orthopaedic Surgery

## 2020-03-25 ENCOUNTER — Encounter: Payer: Self-pay | Admitting: Orthopaedic Surgery

## 2020-03-26 ENCOUNTER — Other Ambulatory Visit: Payer: Self-pay | Admitting: Orthopaedic Surgery

## 2020-03-26 MED ORDER — DOXYCYCLINE HYCLATE 100 MG PO TABS: 100.0000 mg | ORAL_TABLET | Freq: Two times a day (BID) | ORAL | 0 refills | Status: DC

## 2020-03-29 ENCOUNTER — Encounter (HOSPITAL_BASED_OUTPATIENT_CLINIC_OR_DEPARTMENT_OTHER): Payer: Self-pay | Admitting: Orthopaedic Surgery

## 2020-04-04 ENCOUNTER — Other Ambulatory Visit: Payer: Self-pay | Admitting: Pharmacist

## 2020-04-04 DIAGNOSIS — M0579 Rheumatoid arthritis with rheumatoid factor of multiple sites without organ or systems involvement: Secondary | ICD-10-CM

## 2020-04-04 NOTE — Telephone Encounter (Signed)
Last Visit: 02/28/20 Next Visit: 05/29/20 Labs: CBC 03/19/20, BMP 03/19/20, CMP 12/19/19 TB Gold: 03/20/2020   Patient therapy on hold due to surgery on 03/22/20.  Patient is also overdue for labs.  Refill denied until we receive clearance from surgeon to resume Actemra.   Verlin Fester, PharmD, [place], CPP Clinical Specialty Pharmacist (Rheumatology and Pulmonology)  04/04/2020 9:22 AM

## 2020-04-05 ENCOUNTER — Ambulatory Visit (INDEPENDENT_AMBULATORY_CARE_PROVIDER_SITE_OTHER): Payer: 59

## 2020-04-05 ENCOUNTER — Ambulatory Visit (INDEPENDENT_AMBULATORY_CARE_PROVIDER_SITE_OTHER): Payer: 59 | Admitting: Orthopaedic Surgery

## 2020-04-05 ENCOUNTER — Encounter: Payer: Self-pay | Admitting: Orthopaedic Surgery

## 2020-04-05 DIAGNOSIS — Z8781 Personal history of (healed) traumatic fracture: Secondary | ICD-10-CM

## 2020-04-05 DIAGNOSIS — S92902K Unspecified fracture of left foot, subsequent encounter for fracture with nonunion: Secondary | ICD-10-CM

## 2020-04-05 DIAGNOSIS — Z9889 Other specified postprocedural states: Secondary | ICD-10-CM | POA: Diagnosis not present

## 2020-04-05 MED ORDER — DOXYCYCLINE HYCLATE 100 MG PO TABS
100.0000 mg | ORAL_TABLET | Freq: Two times a day (BID) | ORAL | 0 refills | Status: DC
Start: 2020-04-05 — End: 2020-04-11

## 2020-04-05 NOTE — Progress Notes (Signed)
The patient is 2 weeks out now from open reduction/internal fixation of a nonunion of a left foot fifth metatarsal fracture at the metaphyseal diaphyseal junction.  We had to place a screw and put bone graft in the area.  He is doing well overall.  On exam the suture line looks good.  Given the fact that he is a diabetic I would like to keep the sutures in for 1 more week and he can get the foot wet in the shower daily.  He will then just place a dry dressing daily.  3 views of the left foot show intact screw in the fifth metatarsal with bone graft material as well.  These were shared with the patient.  He can advance his weightbearing to 50% weightbearing.  We will see him back in 1 week for suture removal but no x-rays are needed.

## 2020-04-11 ENCOUNTER — Encounter: Payer: Self-pay | Admitting: Physician Assistant

## 2020-04-11 ENCOUNTER — Ambulatory Visit (INDEPENDENT_AMBULATORY_CARE_PROVIDER_SITE_OTHER): Payer: 59 | Admitting: Physician Assistant

## 2020-04-11 DIAGNOSIS — S92902K Unspecified fracture of left foot, subsequent encounter for fracture with nonunion: Secondary | ICD-10-CM

## 2020-04-11 MED ORDER — DOXYCYCLINE HYCLATE 100 MG PO TABS
100.0000 mg | ORAL_TABLET | Freq: Two times a day (BID) | ORAL | 0 refills | Status: DC
Start: 2020-04-11 — End: 2020-04-18

## 2020-04-11 NOTE — Addendum Note (Signed)
Addended by: Richardean Canal on: 04/11/2020 05:10 PM   Modules accepted: Orders

## 2020-04-11 NOTE — Progress Notes (Signed)
HPI: Mr. Lauver returns today follow-up status post open reduction internal fixation left fifth metatarsal fracture 03/22/2020.  Patient is concerned about the red look of his foot and also the fact that he has whiteness around the incision site.  He is on doxycycline 100 mg twice daily.  He has been applying Neosporin to the wound.  He also has been bearing weight in a regular shoe and on his boot.  Physical exam: Left foot surgical incisions well approximated with nylon sutures no signs of wound dehiscence.  He has significant maceration about the incision.  There is no expressible purulence no abnormal warmth.  Dependent rubor which goes away with elevation.  Left calf supple nontender.  Dorsal pedal pulses present.   Impression: Status post open reduction internal fixation left fifth metatarsal Jones fracture  Plan: Explained to him that he needs to elevate the foot above his heart periodically throughout the day.  If he is up and ambulating he needs to be in the cam walker boot.  He'll stop the Neosporin.  He needs to just wash the area with an antibacterial soap in the shower and then dry completely.  See him back in 1 week for possible removal of sutures.  Sooner if there is any questions concerns.  Refill on his doxycycline was given.

## 2020-04-16 ENCOUNTER — Other Ambulatory Visit: Payer: Self-pay | Admitting: Cardiology

## 2020-04-16 ENCOUNTER — Encounter: Payer: Self-pay | Admitting: Orthopaedic Surgery

## 2020-04-16 DIAGNOSIS — I10 Essential (primary) hypertension: Secondary | ICD-10-CM

## 2020-04-16 DIAGNOSIS — I2510 Atherosclerotic heart disease of native coronary artery without angina pectoris: Secondary | ICD-10-CM

## 2020-04-17 ENCOUNTER — Other Ambulatory Visit: Payer: Self-pay

## 2020-04-17 DIAGNOSIS — I10 Essential (primary) hypertension: Secondary | ICD-10-CM

## 2020-04-17 DIAGNOSIS — I2510 Atherosclerotic heart disease of native coronary artery without angina pectoris: Secondary | ICD-10-CM

## 2020-04-17 MED ORDER — BISOPROLOL FUMARATE 5 MG PO TABS: 5.0000 mg | ORAL_TABLET | Freq: Every day | ORAL | 3 refills | Status: DC

## 2020-04-18 ENCOUNTER — Encounter: Payer: Self-pay | Admitting: Orthopaedic Surgery

## 2020-04-18 ENCOUNTER — Ambulatory Visit (INDEPENDENT_AMBULATORY_CARE_PROVIDER_SITE_OTHER): Payer: 59 | Admitting: Orthopaedic Surgery

## 2020-04-18 DIAGNOSIS — Z9889 Other specified postprocedural states: Secondary | ICD-10-CM

## 2020-04-18 DIAGNOSIS — S92902K Unspecified fracture of left foot, subsequent encounter for fracture with nonunion: Secondary | ICD-10-CM

## 2020-04-18 DIAGNOSIS — Z8781 Personal history of (healed) traumatic fracture: Secondary | ICD-10-CM

## 2020-04-18 MED ORDER — DOXYCYCLINE HYCLATE 100 MG PO TABS
100.0000 mg | ORAL_TABLET | Freq: Two times a day (BID) | ORAL | 0 refills | Status: DC
Start: 2020-04-18 — End: 2020-05-01

## 2020-04-18 NOTE — Progress Notes (Signed)
The patient is now almost 4 weeks status post takedown of a nonunion of his left fifth metatarsal fracture and placement of bone grafting and screw.  He is a diabetic.  He reports good diabetic control.  On exam today I did finally remove the sutures from his foot.  I gave him reassurance there is no evidence of infection.  He would still like to continue antibiotics which I think is reasonable given his diabetes.  I placed Steri-Strips over the wound.  He will continue weightbearing as tolerated on his left foot in the cam walking boot.  I would like to see him back in just 2 weeks for a repeat check of his wound as well as new x-rays.  I would like 3 views of his left foot at that visit.  All question concerns were answered addressed.  He will continue to stay out of work as well.

## 2020-04-24 ENCOUNTER — Encounter: Payer: Self-pay | Admitting: Orthopedic Surgery

## 2020-04-24 ENCOUNTER — Ambulatory Visit (INDEPENDENT_AMBULATORY_CARE_PROVIDER_SITE_OTHER): Payer: 59 | Admitting: Physician Assistant

## 2020-04-24 DIAGNOSIS — S92352K Displaced fracture of fifth metatarsal bone, left foot, subsequent encounter for fracture with nonunion: Secondary | ICD-10-CM

## 2020-04-24 NOTE — Progress Notes (Signed)
Office Visit Note   Patient: [name] URSIN           Date of Birth: 08-24-1965           MRN: [id number] Visit Date: 04/24/2020              Requested by: [name], MD [address] [place],  [place] [address] PCP: [name], MD  Chief Complaint  Patient presents with  . Left Foot - Follow-up, Wound Check      HPI: Patient presents in follow-up today he is status post ORIF of a nonunion of the fifth metatarsal fracture by Dr. Magnus Ivan.  He was concerned because he felt like he had more drainage through the Steri-Strips.  He is a smoker and a diabetic.  He has tried to cut down on his smoking by 70% also trying to maintain good glucose control  Assessment & Plan: Visit Diagnoses: No diagnosis found.  Plan: Patient was seen today by Dr. [name].  We have recommended Vive compression stocking size large.  We placed one on his leg today he will change this every day when he is taking a shower. Follow up 1 week with an xray of his foot  Follow-Up Instructions: No follow-ups on file.   Ortho Exam  Patient is alert, oriented, no adenopathy, well-dressed, normal affect, normal respiratory effort. Focused examination demonstrates well approximated wound edges.  He does have some moisture and maceration of the wound.  But no necrosis or wound dehiscence.  Palpable dorsalis pedis pulses.  Swelling is very well controlled.  No cellulitis or evidence of infection  Imaging: No results found. No images are attached to the encounter.  Labs: Lab Results  Component Value Date   HGBA1C 7.4 (H) 04/25/2014   HGBA1C 7.4 (H) 04/25/2014   ESRSEDRATE 9 09/26/2019   ESRSEDRATE 12 09/09/2017   ESRSEDRATE 58 (H) 06/01/2014     Lab Results  Component Value Date   ALBUMIN 3.7 03/15/2018   ALBUMIN 3.7 09/09/2017   ALBUMIN 4.2 03/30/2017    Lab Results  Component Value Date   MG 2.3 04/28/2014   MG 2.4 04/28/2014   MG 2.7 (H) 04/27/2014   Lab Results  Component Value  Date   VD25OH 57 06/22/2019    No results found for: PREALBUMIN CBC EXTENDED Latest Ref Rng & Units 12/19/2019 09/26/2019 06/22/2019  WBC 3.8 - 10.8 Thousand/uL 10.5 9.6 10.0  RBC 4.20 - 5.80 Million/uL 4.27 4.46 4.41  HGB 13.2 - 17.1 g/dL 13.1(L) 13.7 13.2  HCT 38 - 50 % 39.4 41.4 40.7  PLT 140 - 400 Thousand/uL 251 270 260  NEUTROABS 1,500 - 7,800 cells/uL 5,660 5,386 5,420  LYMPHSABS 850 - 3,900 cells/uL 3,507 2,957 3,390     There is no height or weight on file to calculate BMI.  Orders:  No orders of the defined types were placed in this encounter.  No orders of the defined types were placed in this encounter.    Procedures: No procedures performed  Clinical Data: No additional findings.  ROS:  All other systems negative, except as noted in the HPI. Review of Systems  Objective: Vital Signs: There were no vitals taken for this visit.  Specialty Comments:  No specialty comments available.  PMFS History: Patient Active Problem List   Diagnosis Date Noted  . 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 Osteopenia of multiple sites 02/28/2020  . PVD (peripheral vascular disease) (HCC) 10/11/2019  .  Chronic venous insufficiency 10/05/2018  . Controlled type 1 diabetes mellitus with diabetic peripheral angiopathy without gangrene (HCC) 08/14/2017  . Dyslipidemia 03/26/2017  . High risk medication use 09/23/2016  . History of hypertension 09/23/2016  . History of chronic kidney disease 09/23/2016  . History of coronary artery disease 09/23/2016  . Tobacco abuse 09/23/2016  . Primary osteoarthritis of both knees 09/23/2016  . History of diabetes mellitus 09/23/2016  . 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 Chest pain with high risk for cardiac etiology 10/29/2015  . Asymptomatic bilateral carotid artery stenosis 06/08/2014  . Pleural effusion, bilateral 06/03/2014  . Dyspnea 06/01/2014  . Diabetes (HCC) 05/03/2014  .  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/03/2014  . 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 Chronic renal disease, stage 3, moderately decreased glomerular filtration rate between 30-59 mL/min/1.73 square meter 05/03/2014  . CAD (coronary artery disease) 04/27/2014   Past Medical History:  Diagnosis Date  . Anginal pain (HCC)   . Anxiety   . Arthritis    RA IN HANDS  . Asthma   . CAD (coronary artery disease) 04/27/2014  . Chronic renal disease, stage 3, moderately decreased glomerular filtration rate between 30-59 mL/min/1.73 square meter 05/03/2014  . COPD (chronic obstructive pulmonary disease) (HCC)   . Coronary artery disease   . Diabetes (HCC) 05/03/2014  . Diabetes mellitus without complication (HCC)    type 1  . Hyperlipidemia   . Left shoulder pain   . 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/03/2014  . Shortness of breath   . Sleep apnea    mild OSA, no CPAP  . Unstable angina pectoris (HCC) 04/25/2014    Family History  Problem Relation Age of Onset  . Prostate cancer Father   . Heart disease Father   . Stomach cancer Mother   . Heart disease Brother   . Asthma Daughter   . Healthy Daughter     Past Surgical History:  Procedure Laterality Date  . CARDIAC CATHETERIZATION  04/25/2014   BY DR Jacinto Halim  . CARDIAC CATHETERIZATION N/A 10/30/2015   Procedure: Left Heart Cath and Cors/Grafts Angiography;  Surgeon: Yates Decamp, MD;  Location: [facility] INVASIVE CV LAB;  Service: Cardiovascular;  Laterality: N/A;  . CARPAL TUNNEL RELEASE    . CATARACT EXTRACTION, BILATERAL    . CORONARY ARTERY BYPASS GRAFT N/A 04/27/2014   Procedure: CORONARY ARTERY BYPASS GRAFTING on pump using left internal mammary artery to LAD coronary artery, right great saphenous vein graft to diagonal coronary artery with sequential to OM1 and circumflex coronary arteries. Right greater saphenous vein graft to posterior descending coronary artery. ;  Surgeon: Delight Ovens, MD;  Location: [facility] OR;   Service: Open Heart Surgery;  Laterality: N  . elbow drained Left 05/09/2019  . ENDOVEIN HARVEST OF GREATER SAPHENOUS VEIN Right 04/27/2014   Procedure: ENDOVEIN HARVEST OF GREATER SAPHENOUS VEIN;  Surgeon: Delight Ovens, MD;  Location: [facility] OR;  Service: Open Heart Surgery;  Laterality: Right;  . EXCISION ORAL TUMOR N/A 03/01/2018   Procedure: EXCISION ORAL TUMOR;  Surgeon: Christia Reading, MD;  Location: [facility];  Service: ENT;  Laterality: N/A;  . EYE SURGERY     LASER  . EYE SURGERY Bilateral    astigmatism correction   . INTRAOPERATIVE TRANSESOPHAGEAL ECHOCARDIOGRAM N/A 04/27/2014   Procedure: INTRAOPERATIVE TRANSESOPHAGEAL ECHOCARDIOGRAM;  Surgeon: Delight Ovens, MD;  Location: [facility] OR;  Service: Open Heart Surgery;  Laterality: N/A;  . LEFT HEART CATHETERIZATION WITH  CORONARY ANGIOGRAM N/A 04/25/2014   Procedure: LEFT HEART CATHETERIZATION WITH CORONARY ANGIOGRAM;  Surgeon: Pamella Pert, MD;  Location: [facility] CATH LAB;  Service: Cardiovascular;  Laterality: N/A;  . MOUTH SURGERY  11/15/2017   tongue surgery   . ORIF TOE FRACTURE Left 03/22/2020   Procedure: OPEN REDUCTION INTERNAL FIXATION (ORIF) Non Union 5th Metatarsal;  Surgeon: Kathryne Hitch, MD;  Location: [facility];  Service: Orthopedics;  Laterality: Left;   Social History   Occupational History  . Not on file  Tobacco Use  . Smoking status: Current Every Day Smoker    Packs/day: 1.00    Years: 34.00    Pack years: 34.00    Types: Cigarettes  . Smokeless tobacco: Never Used  Vaping Use  . Vaping Use: Never used  Substance and Sexual Activity  . Alcohol use: Yes    Alcohol/week: 0.0 standard drinks    Comment: RARE  . Drug use: No  . Sexual activity: Not on file

## 2020-05-01 ENCOUNTER — Encounter: Payer: Self-pay | Admitting: Physician Assistant

## 2020-05-01 ENCOUNTER — Ambulatory Visit (INDEPENDENT_AMBULATORY_CARE_PROVIDER_SITE_OTHER): Payer: 59 | Admitting: Orthopedic Surgery

## 2020-05-01 ENCOUNTER — Ambulatory Visit: Payer: Self-pay

## 2020-05-01 VITALS — Ht 68.0 in | Wt 195.0 lb

## 2020-05-01 DIAGNOSIS — S92352K Displaced fracture of fifth metatarsal bone, left foot, subsequent encounter for fracture with nonunion: Secondary | ICD-10-CM

## 2020-05-01 MED ORDER — DOXYCYCLINE HYCLATE 100 MG PO TABS
100.0000 mg | ORAL_TABLET | Freq: Two times a day (BID) | ORAL | 0 refills | Status: DC
Start: 2020-05-01 — End: 2020-05-15

## 2020-05-02 ENCOUNTER — Encounter: Payer: Self-pay | Admitting: Orthopedic Surgery

## 2020-05-02 ENCOUNTER — Ambulatory Visit: Payer: 59 | Admitting: Orthopaedic Surgery

## 2020-05-02 NOTE — Progress Notes (Signed)
Office Visit Note   Patient: [name]           Date of Birth: February 10, 1966           MRN: [id number] Visit Date: 05/01/2020              Requested by: [name], MD [address] [place],  [place] [address] PCP: [name], MD  Chief Complaint  Patient presents with  . Left Foot - Routine Post Op    03/22/20 ORIF nonunion 5th MT fx       HPI: Patient is a 54 year old gentleman who presents 5 weeks status post open reduction internal fixation for nonunion base of the fifth metatarsal fracture left foot.  Patient states he was previously smoking 1-1/2 packs a day he states that he has weaned this off and is now completely stopped.  His hemoglobin A1c is 6 he states his white cell count was 12.8 he states it usually runs around 10.  Assessment & Plan: Visit Diagnoses:  1. 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  Plan to repeat three-view radiographs of the right foot at follow-up.  Follow-Up Instructions: Return in about 3 weeks (around 05/22/2020).   Ortho Exam  Patient is alert, oriented, no adenopathy, well-dressed, normal affect, normal respiratory effort. Examination patient has some mild redness around the incision.  The incision is well approximated there is no tenderness to palpation no drainage no clinical signs of infection.  Patient requested a refill of doxycycline and this was provided.  Imaging: XR Foot Complete Left  Result Date: 05/02/2020 Three-view radiographs of the left foot shows stable internal fixation with intact bone graft.  No evidence of hardware loosening.  No images are attached to the encounter.  Labs: Lab Results  Component Value Date   HGBA1C 7.4 (H) 04/25/2014   HGBA1C 7.4 (H) 04/25/2014   ESRSEDRATE 9 09/26/2019   ESRSEDRATE 12 09/09/2017   ESRSEDRATE 58 (H) 06/01/2014      Lab Results  Component Value Date   ALBUMIN 3.7 03/15/2018   ALBUMIN 3.7 09/09/2017   ALBUMIN 4.2 03/30/2017    Lab Results  Component Value Date   MG 2.3 04/28/2014   MG 2.4 04/28/2014   MG 2.7 (H) 04/27/2014   Lab Results  Component Value Date   VD25OH 57 06/22/2019    No results found for: PREALBUMIN CBC EXTENDED Latest Ref Rng & Units 12/19/2019 09/26/2019 06/22/2019  WBC 3.8 - 10.8 Thousand/uL 10.5 9.6 10.0  RBC 4.20 - 5.80 Million/uL 4.27 4.46 4.41  HGB 13.2 - 17.1 g/dL 13.1(L) 13.7 13.2  HCT 38 - 50 % 39.4 41.4 40.7  PLT 140 - 400 Thousand/uL 251 270 260  NEUTROABS 1,500 - 7,800 cells/uL 5,660 5,386 5,420  LYMPHSABS 850 - 3,900 cells/uL 3,507 2,957 3,390     Body mass index is 29.65 kg/m.  Orders:  Orders Placed This Encounter  Procedures  . XR Foot Complete Left   Meds ordered this encounter  Medications  . doxycycline (VIBRA-TABS) 100 MG tablet    Sig: Take 1 tablet (100 mg total) by mouth 2 (two) times daily.    Dispense:  30 tablet    Refill:  0     Procedures: No procedures performed  Clinical Data: No additional findings.  ROS:  All other systems negative, except as noted in  the HPI. Review of Systems  Objective: Vital Signs: Ht 5\' 8"  (1.727 m)   Wt 195 lb (88.5 kg)   BMI 29.65 kg/m   Specialty Comments:  No specialty comments available.  PMFS History: Patient Active Problem List   Diagnosis Date Noted  . 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 Osteopenia of multiple sites 02/28/2020  . PVD (peripheral vascular disease) (HCC) 10/11/2019  . Chronic venous insufficiency 10/05/2018  . Controlled type 1 diabetes mellitus with diabetic peripheral angiopathy without gangrene (HCC) 08/14/2017  . Dyslipidemia 03/26/2017  . High risk medication use 09/23/2016  . History of hypertension 09/23/2016  . History of chronic kidney disease 09/23/2016  . History of coronary artery disease  09/23/2016  . Tobacco abuse 09/23/2016  . Primary osteoarthritis of both knees 09/23/2016  . History of diabetes mellitus 09/23/2016  . 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 Chest pain with high risk for cardiac etiology 10/29/2015  . Asymptomatic bilateral carotid artery stenosis 06/08/2014  . Pleural effusion, bilateral 06/03/2014  . Dyspnea 06/01/2014  . Diabetes (HCC) 05/03/2014  . 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/03/2014  . 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 Chronic renal disease, stage 3, moderately decreased glomerular filtration rate between 30-59 mL/min/1.73 square meter 05/03/2014  . CAD (coronary artery disease) 04/27/2014   Past Medical History:  Diagnosis Date  . Anginal pain (HCC)   . Anxiety   . Arthritis    RA IN HANDS  . Asthma   . CAD (coronary artery disease) 04/27/2014  . Chronic renal disease, stage 3, moderately decreased glomerular filtration rate between 30-59 mL/min/1.73 square meter 05/03/2014  . COPD (chronic obstructive pulmonary disease) (HCC)   . Coronary artery disease   . Diabetes (HCC) 05/03/2014  . Diabetes mellitus without complication (HCC)    type 1  . Hyperlipidemia   . Left shoulder pain   . 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/03/2014  . Shortness of breath   . Sleep apnea    mild OSA, no CPAP  . Unstable angina pectoris (HCC) 04/25/2014    Family History  Problem Relation Age of Onset  . Prostate cancer Father   . Heart disease Father   . Stomach cancer Mother   . Heart disease Brother   . Asthma Daughter   . Healthy Daughter     Past Surgical History:  Procedure Laterality Date  . CARDIAC CATHETERIZATION  04/25/2014   BY DR Jacinto Halim  . CARDIAC CATHETERIZATION N/A 10/30/2015   Procedure: Left Heart Cath and Cors/Grafts Angiography;  Surgeon: Yates Decamp, MD;  Location: [facility] INVASIVE CV LAB;  Service: Cardiovascular;  Laterality: N/A;  . CARPAL TUNNEL RELEASE    . CATARACT EXTRACTION, BILATERAL    .  CORONARY ARTERY BYPASS GRAFT N/A 04/27/2014   Procedure: CORONARY ARTERY BYPASS GRAFTING on pump using left internal mammary artery to LAD coronary artery, right great saphenous vein graft to diagonal coronary artery with sequential to OM1 and circumflex coronary arteries. Right greater saphenous vein graft to posterior descending coronary artery. ;  Surgeon: Delight Ovens, MD;  Location: [facility] OR;  Service: Open Heart Surgery;  Laterality: N  . elbow drained Left 05/09/2019  . ENDOVEIN HARVEST OF GREATER SAPHENOUS VEIN Right 04/27/2014   Procedure: ENDOVEIN HARVEST OF GREATER SAPHENOUS VEIN;  Surgeon: Delight Ovens, MD;  Location: [facility] OR;  Service: Open Heart Surgery;  Laterality: Right;  . EXCISION ORAL TUMOR N/A 03/01/2018   Procedure: EXCISION  ORAL TUMOR;  Surgeon: Christia Reading, MD;  Location: [facility];  Service: ENT;  Laterality: N/A;  . EYE SURGERY     LASER  . EYE SURGERY Bilateral    astigmatism correction   . INTRAOPERATIVE TRANSESOPHAGEAL ECHOCARDIOGRAM N/A 04/27/2014   Procedure: INTRAOPERATIVE TRANSESOPHAGEAL ECHOCARDIOGRAM;  Surgeon: Delight Ovens, MD;  Location: [facility] OR;  Service: Open Heart Surgery;  Laterality: N/A;  . LEFT HEART CATHETERIZATION WITH CORONARY ANGIOGRAM N/A 04/25/2014   Procedure: LEFT HEART CATHETERIZATION WITH CORONARY ANGIOGRAM;  Surgeon: Pamella Pert, MD;  Location[facility] CATH LAB;  Service: Cardiovascular;  Laterality: N/A;  . MOUTH SURGERY  11/15/2017   tongue surgery   . ORIF TOE FRACTURE Left 03/22/2020   Procedure: OPEN REDUCTION INTERNAL FIXATION (ORIF) Non Union 5th Metatarsal;  Surgeon: Kathryne Hitch, MD;  Location: [facility];  Service: Orthopedics;  Laterality: Left;   Social History   Occupational History  . Not on file  Tobacco Use  . Smoking status: Current Every Day Smoker    Packs/day: 1.00    Years: 34.00    Pack years: 34.00    Types: Cigarettes  . Smokeless tobacco: Never Used  Vaping Use   . Vaping Use: Never used  Substance and Sexual Activity  . Alcohol use: Yes    Alcohol/week: 0.0 standard drinks    Comment: RARE  . Drug use: No  . Sexual activity: Not on file

## 2020-05-04 ENCOUNTER — Ambulatory Visit: Payer: 59 | Admitting: Cardiology

## 2020-05-04 DIAGNOSIS — Z79899 Other long term (current) drug therapy: Secondary | ICD-10-CM | POA: Insufficient documentation

## 2020-05-15 ENCOUNTER — Ambulatory Visit (INDEPENDENT_AMBULATORY_CARE_PROVIDER_SITE_OTHER): Payer: 59 | Admitting: Physician Assistant

## 2020-05-15 ENCOUNTER — Ambulatory Visit: Payer: Self-pay

## 2020-05-15 ENCOUNTER — Encounter: Payer: Self-pay | Admitting: Physician Assistant

## 2020-05-15 ENCOUNTER — Telehealth: Payer: Self-pay

## 2020-05-15 DIAGNOSIS — S92352K Displaced fracture of fifth metatarsal bone, left foot, subsequent encounter for fracture with nonunion: Secondary | ICD-10-CM

## 2020-05-15 MED ORDER — DOXYCYCLINE HYCLATE 100 MG PO TABS
100.0000 mg | ORAL_TABLET | Freq: Two times a day (BID) | ORAL | 0 refills | Status: DC
Start: 2020-05-15 — End: 2020-10-03

## 2020-05-15 NOTE — Progress Notes (Signed)
Office Visit Note   Patient: [name]           Date of Birth: 11/07/1965           MRN: [id number] Visit Date: 05/15/2020              Requested by: [name], MD [address] [place],  [place] [address] PCP: [name], MD  Chief Complaint  Patient presents with  . Right Foot - Follow-up, Pain  . Left Foot - Pain      HPI: Patient presents today 7 weeks status post ORIF of left foot base of fifth metatarsal fracture nonunion with grafting.  Overall he is doing well he has been using his tall cam walker boot.  He does report less pain.  He is concerned because he has a small abrasion on his right foot on his second toe.  Wants to make sure this is infected.  He has been using Neosporin and covering this as needed denies any pain  Assessment & Plan: Visit Diagnoses:  1. 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  He understands the importance of staying on a probiotic while taking this medication and discontinuing it if he has any diarrhea.  Follow-up in 4 weeks.  Also have given him a spacer to use in the first webspace in both his toes hopefully this will present some of the contact pressure he has been having difficulty with.  He may wean out of the boot as tolerated.  Hopefully he can go back to work full duties in 1 month  Follow-Up Instructions: No follow-ups on file.   Ortho Exam  Patient is alert, oriented, no adenopathy, well-dressed, normal affect, normal respiratory effort. Focused examination of his left foot well-healed surgical incision no swelling no cellulitis he has a final resolving eschar.  Only mild to tenderness to deep palpation.  Pulses are easily palpable.  No swelling.  No signs of infection.  Right foot no swelling no redness no open areas.  He does have a very small abrasion from contact pressure between his first and  second toes.  There is no redness or signs of infection.  Pulses are easily palpable  Imaging: No results found. No images are attached to the encounter.  Labs: Lab Results  Component Value Date   HGBA1C 7.4 (H) 04/25/2014   HGBA1C 7.4 (H) 04/25/2014   ESRSEDRATE 9 09/26/2019   ESRSEDRATE 12 09/09/2017   ESRSEDRATE 58 (H) 06/01/2014     Lab Results  Component Value Date   ALBUMIN 3.7 03/15/2018   ALBUMIN 3.7 09/09/2017   ALBUMIN 4.2 03/30/2017    Lab Results  Component Value Date   MG 2.3 04/28/2014   MG 2.4 04/28/2014   MG 2.7 (H) 04/27/2014   Lab Results  Component Value Date   VD25OH 57 06/22/2019    No results found for: PREALBUMIN CBC EXTENDED Latest Ref Rng & Units 12/19/2019 09/26/2019 06/22/2019  WBC 3.8 - 10.8 Thousand/uL 10.5 9.6 10.0  RBC 4.20 - 5.80 Million/uL 4.27 4.46 4.41  HGB 13.2 - 17.1 g/dL 13.1(L) 13.7 13.2  HCT 38 - 50 % 39.4 41.4 40.7  PLT 140 - 400 Thousand/uL 251 270 260  NEUTROABS 1,500 - 7,800 cells/uL 5,660 5,386 5,420  LYMPHSABS 850 - 3,900 cells/uL 3,507 2,957 3,390     There is no height or  weight on file to calculate BMI.  Orders:  Orders Placed This Encounter  Procedures  . XR Foot Complete Left   Meds ordered this encounter  Medications  . doxycycline (VIBRA-TABS) 100 MG tablet    Sig: Take 1 tablet (100 mg total) by mouth 2 (two) times daily.    Dispense:  30 tablet    Refill:  0     Procedures: No procedures performed  Clinical Data: No additional findings.  ROS:  All other systems negative, except as noted in the HPI. Review of Systems  Objective: Vital Signs: There were no vitals taken for this visit.  Specialty Comments:  No specialty comments available.  PMFS History: Patient Active Problem List   Diagnosis Date Noted  . 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 Osteopenia of multiple sites 02/28/2020  . PVD (peripheral vascular disease) (HCC) 10/11/2019  .  Chronic venous insufficiency 10/05/2018  . Controlled type 1 diabetes mellitus with diabetic peripheral angiopathy without gangrene (HCC) 08/14/2017  . Dyslipidemia 03/26/2017  . High risk medication use 09/23/2016  . History of hypertension 09/23/2016  . History of chronic kidney disease 09/23/2016  . History of coronary artery disease 09/23/2016  . Tobacco abuse 09/23/2016  . Primary osteoarthritis of both knees 09/23/2016  . History of diabetes mellitus 09/23/2016  . 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 Chest pain with high risk for cardiac etiology 10/29/2015  . Asymptomatic bilateral carotid artery stenosis 06/08/2014  . Pleural effusion, bilateral 06/03/2014  . Dyspnea 06/01/2014  . Diabetes (HCC) 05/03/2014  . 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/03/2014  . 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 Chronic renal disease, stage 3, moderately decreased glomerular filtration rate between 30-59 mL/min/1.73 square meter (HCC) 05/03/2014  . CAD (coronary artery disease) 04/27/2014   Past Medical History:  Diagnosis Date  . Anginal pain (HCC)   . Anxiety   . Arthritis    RA IN HANDS  . Asthma   . CAD (coronary artery disease) 04/27/2014  . Chronic renal disease, stage 3, moderately decreased glomerular filtration rate between 30-59 mL/min/1.73 square meter (HCC) 05/03/2014  . COPD (chronic obstructive pulmonary disease) (HCC)   . Coronary artery disease   . Diabetes (HCC) 05/03/2014  . Diabetes mellitus without complication (HCC)    type 1  . Hyperlipidemia   . Left shoulder pain   . 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/03/2014  . Shortness of breath   . Sleep apnea    mild OSA, no CPAP  . Unstable angina pectoris (HCC) 04/25/2014    Family History  Problem Relation Age of Onset  . Prostate cancer Father   . Heart disease Father   . Stomach cancer Mother   . Heart disease Brother   . Asthma Daughter   . Healthy Daughter     Past Surgical  History:  Procedure Laterality Date  . CARDIAC CATHETERIZATION  04/25/2014   BY DR Jacinto Halim  . CARDIAC CATHETERIZATION N/A 10/30/2015   Procedure: Left Heart Cath and Cors/Grafts Angiography;  Surgeon: Yates Decamp, MD;  Location: [facility] INVASIVE CV LAB;  Service: Cardiovascular;  Laterality: N/A;  . CARPAL TUNNEL RELEASE    . CATARACT EXTRACTION, BILATERAL    . CORONARY ARTERY BYPASS GRAFT N/A 04/27/2014   Procedure: CORONARY ARTERY BYPASS GRAFTING on pump using left internal mammary artery to LAD coronary artery, right great saphenous vein graft to diagonal coronary artery with sequential to OM1 and circumflex coronary arteries. Right  greater saphenous vein graft to posterior descending coronary artery. ;  Surgeon: Delight Ovens, MD;  Location: [facility] OR;  Service: Open Heart Surgery;  Laterality: N  . elbow drained Left 05/09/2019  . ENDOVEIN HARVEST OF GREATER SAPHENOUS VEIN Right 04/27/2014   Procedure: ENDOVEIN HARVEST OF GREATER SAPHENOUS VEIN;  Surgeon: Delight Ovens, MD;  Location: [facility] OR;  Service: Open Heart Surgery;  Laterality: Right;  . EXCISION ORAL TUMOR N/A 03/01/2018   Procedure: EXCISION ORAL TUMOR;  Surgeon: Christia Reading, MD;  Location:  SURGERY CENTER;  Service: ENT;  Laterality: N/A;  . EYE SURGERY     LASER  . EYE SURGERY Bilateral    astigmatism correction   . INTRAOPERATIVE TRANSESOPHAGEAL ECHOCARDIOGRAM N/A 04/27/2014   Procedure: INTRAOPERATIVE TRANSESOPHAGEAL ECHOCARDIOGRAM;  Surgeon: Delight Ovens, MD;  Location: [facility] OR;  Service: Open Heart Surgery;  Laterality: N/A;  . LEFT HEART CATHETERIZATION WITH CORONARY ANGIOGRAM N/A 04/25/2014   Procedure: LEFT HEART CATHETERIZATION WITH CORONARY ANGIOGRAM;  Surgeon: Pamella Pert, MD;  Location: [facility] CATH LAB;  Service: Cardiovascular;  Laterality: N/A;  . MOUTH SURGERY  11/15/2017   tongue surgery   . ORIF TOE FRACTURE Left 03/22/2020   Procedure: OPEN REDUCTION INTERNAL FIXATION (ORIF) Non Union 5th Metatarsal;   Surgeon: Kathryne Hitch, MD;  Location:  SURGERY CENTER;  Service: Orthopedics;  Laterality: Left;   Social History   Occupational History  . Not on file  Tobacco Use  . Smoking status: Current Every Day Smoker    Packs/day: 1.00    Years: 34.00    Pack years: 34.00    Types: Cigarettes  . Smokeless tobacco: Never Used  Vaping Use  . Vaping Use: Never used  Substance and Sexual Activity  . Alcohol use: Yes    Alcohol/week: 0.0 standard drinks    Comment: RARE  . Drug use: No  . Sexual activity: Not on file

## 2020-05-15 NOTE — Telephone Encounter (Signed)
Dr. Billy Fischer [facility] dental office would like premedication note/letter faxed to their office at [phone].  CB# [phone].  Please advise.  Thank you.

## 2020-05-16 NOTE — Telephone Encounter (Signed)
LMOM letting them know he doesn't need pre med

## 2020-05-16 NOTE — Telephone Encounter (Signed)
No reason for him to need antibiotics is there?

## 2020-05-16 NOTE — Telephone Encounter (Signed)
No antibiotics are needed from my standpoint for dental procedures.

## 2020-05-28 NOTE — Progress Notes (Deleted)
Office Visit Note  Patient: [name]             Date of Birth: Feb 04, 1966           MRN: [id number]             PCP: [name], MD Referring: [name], MD Visit Date: 05/29/2020 Occupation: @GUAROCC @  Subjective:  No chief complaint on file.   History of Present Illness: DERAK SCHURMAN is a 54 y.o. male ***   Activities of Daily Living:  Patient reports morning stiffness for *** {minute/hour:19697}.   Patient {ACTIONS;DENIES/REPORTS:21021675::"Denies"} nocturnal pain.  Difficulty dressing/grooming: {ACTIONS;DENIES/REPORTS:21021675::"Denies"} Difficulty climbing stairs: {ACTIONS;DENIES/REPORTS:21021675::"Denies"} Difficulty getting out of chair: {ACTIONS;DENIES/REPORTS:21021675::"Denies"} Difficulty using hands for taps, buttons, cutlery, and/or writing: {ACTIONS;DENIES/REPORTS:21021675::"Denies"}  No Rheumatology ROS completed.   PMFS History:  Patient Active Problem List   Diagnosis Date Noted  . 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 Osteopenia of multiple sites 02/28/2020  . PVD (peripheral vascular disease) (HCC) 10/11/2019  . Chronic venous insufficiency 10/05/2018  . Controlled type 1 diabetes mellitus with diabetic peripheral angiopathy without gangrene (HCC) 08/14/2017  . Dyslipidemia 03/26/2017  . High risk medication use 09/23/2016  . History of hypertension 09/23/2016  . History of chronic kidney disease 09/23/2016  . History of coronary artery disease 09/23/2016  . Tobacco abuse 09/23/2016  . Primary osteoarthritis of both knees 09/23/2016  . History of diabetes mellitus 09/23/2016  . 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 Chest pain with high risk for cardiac etiology 10/29/2015  . Asymptomatic bilateral carotid artery stenosis 06/08/2014  . Pleural effusion, bilateral 06/03/2014  . Dyspnea 06/01/2014  . Diabetes (HCC) 05/03/2014  . Rheumatoid arthritis involving multiple joints (HCC)  05/03/2014  . 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 Chronic renal disease, stage 3, moderately decreased glomerular filtration rate between 30-59 mL/min/1.73 square meter (HCC) 05/03/2014  . CAD (coronary artery disease) 04/27/2014    Past Medical History:  Diagnosis Date  . Anginal pain (HCC)   . Anxiety   . Arthritis    RA IN HANDS  . Asthma   . CAD (coronary artery disease) 04/27/2014  . Chronic renal disease, stage 3, moderately decreased glomerular filtration rate between 30-59 mL/min/1.73 square meter (HCC) 05/03/2014  . COPD (chronic obstructive pulmonary disease) (HCC)   . Coronary artery disease   . Diabetes (HCC) 05/03/2014  . Diabetes mellitus without complication (HCC)    type 1  . Hyperlipidemia   . Left shoulder pain   . 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/03/2014  . Shortness of breath   . Sleep apnea    mild OSA, no CPAP  . Unstable angina pectoris (HCC) 04/25/2014    Family History  Problem Relation Age of Onset  . Prostate cancer Father   . Heart disease Father   . Stomach cancer Mother   . Heart disease Brother   . Asthma Daughter   . Healthy Daughter    Past Surgical History:  Procedure Laterality Date  . CARDIAC CATHETERIZATION  04/25/2014   BY DR 04/27/2014  . CARDIAC CATHETERIZATION N/A 10/30/2015   Procedure: Left Heart Cath and Cors/Grafts Angiography;  Surgeon: 11/01/2015, MD;  Location: [facility] INVASIVE CV LAB;  Service: Cardiovascular;  Laterality: N/A;  . CARPAL TUNNEL RELEASE    . CATARACT EXTRACTION, BILATERAL    . CORONARY ARTERY BYPASS GRAFT N/A 04/27/2014   Procedure: CORONARY ARTERY BYPASS GRAFTING on pump using left internal mammary artery to LAD  coronary artery, right great saphenous vein graft to diagonal coronary artery with sequential to OM1 and circumflex coronary arteries. Right greater saphenous vein graft to posterior descending coronary artery. ;  Surgeon: Delight Ovens, MD;  Location: [facility] OR;  Service: Open Heart Surgery;   Laterality: N  . elbow drained Left 05/09/2019  . ENDOVEIN HARVEST OF GREATER SAPHENOUS VEIN Right 04/27/2014   Procedure: ENDOVEIN HARVEST OF GREATER SAPHENOUS VEIN;  Surgeon: Delight Ovens, MD;  Location: [facility] OR;  Service: Open Heart Surgery;  Laterality: Right;  . EXCISION ORAL TUMOR N/A 03/01/2018   Procedure: EXCISION ORAL TUMOR;  Surgeon: Christia Reading, MD;  Location: [facility];  Service: ENT;  Laterality: N/A;  . EYE SURGERY     LASER  . EYE SURGERY Bilateral    astigmatism correction   . INTRAOPERATIVE TRANSESOPHAGEAL ECHOCARDIOGRAM N/A 04/27/2014   Procedure: INTRAOPERATIVE TRANSESOPHAGEAL ECHOCARDIOGRAM;  Surgeon: Delight Ovens, MD;  Location: [facility] OR;  Service: Open Heart Surgery;  Laterality: N/A;  . LEFT HEART CATHETERIZATION WITH CORONARY ANGIOGRAM N/A 04/25/2014   Procedure: LEFT HEART CATHETERIZATION WITH CORONARY ANGIOGRAM;  Surgeon: Pamella Pert, MD;  Location: [facility] CATH LAB;  Service: Cardiovascular;  Laterality: N/A;  . MOUTH SURGERY  11/15/2017   tongue surgery   . ORIF TOE FRACTURE Left 03/22/2020   Procedure: OPEN REDUCTION INTERNAL FIXATION (ORIF) Non Union 5th Metatarsal;  Surgeon: Kathryne Hitch, MD;  Location: [facility];  Service: Orthopedics;  Laterality: Left;   Social History   Social History Narrative  . Not on file    There is no immunization history on file for this patient.   Objective: Vital Signs: There were no vitals taken for this visit.   Physical Exam   Musculoskeletal Exam: ***  CDAI Exam: CDAI Score: -- Patient Global: --; Provider Global: -- Swollen: --; Tender: -- Joint Exam 05/29/2020   No joint exam has been documented for this visit   There is currently no information documented on the homunculus. Go to the Rheumatology activity and complete the homunculus joint exam.  Investigation: No additional findings.  Imaging: XR Foot Complete Left  Result Date: 05/15/2020 3 views of  his left foot today demonstrate well-maintained alignment.  There is good bony bridging and incorporation of the graft into the fracture  XR Foot Complete Left  Result Date: 05/02/2020 Three-view radiographs of the left foot shows stable internal fixation with intact bone graft.  No evidence of hardware loosening. Patient has significant peripheral vascular disease with calcification of the arteries within the foot.   Recent Labs: Lab Results  Component Value Date   WBC 10.5 12/19/2019   HGB 13.1 (L) 12/19/2019   PLT 251 12/19/2019   NA 139 03/19/2020   K 4.9 03/19/2020   CL 105 03/19/2020   CO2 26 03/19/2020   GLUCOSE 228 (H) 03/19/2020   BUN 10 03/19/2020   CREATININE 1.38 (H) 03/19/2020   BILITOT 0.3 12/19/2019   ALKPHOS 97 03/15/2018   AST 37 (H) 12/19/2019   ALT 40 12/19/2019   PROT 6.5 12/19/2019   PROT 6.5 12/19/2019   ALBUMIN 3.7 03/15/2018   CALCIUM 8.9 03/19/2020   GFRAA >60 03/19/2020   QFTBGOLDPLUS NEGATIVE 03/21/2019    Speciality Comments: Prior therapy includes: Orencia, Humira and Enbrel (inadequate response), MTX (GI upset and elevated creatinine), Arava (diarrhea). He is not a good candidate for Plaquenil due to diabetic retinopathy and CKD stage 3. He is not a good candidate for  JAK inhibitors due to CAD, PVD, and chronic venous insufficiency.   Procedures:  No procedures performed Allergies: Morphine and related and Metoprolol   Assessment / Plan:     Visit Diagnoses: No diagnosis found.  Orders: No orders of the defined types were placed in this encounter.  No orders of the defined types were placed in this encounter.   Face-to-face time spent with patient was *** minutes. Greater than 50% of time was spent in counseling and coordination of care.  Follow-Up Instructions: No follow-ups on file.   Ellen Henri, CMA  Note - This record has been created using Animal nutritionist.  Chart creation errors have been sought, but may not always   have been located. Such creation errors do not reflect on  the standard of medical care.

## 2020-05-29 ENCOUNTER — Ambulatory Visit: Payer: 59 | Admitting: Physician Assistant

## 2020-05-29 DIAGNOSIS — Z79899 Other long term (current) drug therapy: Secondary | ICD-10-CM

## 2020-05-29 DIAGNOSIS — Z87448 Personal history of other diseases of urinary system: Secondary | ICD-10-CM

## 2020-05-29 DIAGNOSIS — Z8639 Personal history of other endocrine, nutritional and metabolic disease: Secondary | ICD-10-CM

## 2020-05-29 DIAGNOSIS — S92355K Nondisplaced fracture of fifth metatarsal bone, left foot, subsequent encounter for fracture with nonunion: Secondary | ICD-10-CM

## 2020-05-29 DIAGNOSIS — Z8679 Personal history of other diseases of the circulatory system: Secondary | ICD-10-CM

## 2020-05-29 DIAGNOSIS — M0630 Rheumatoid nodule, unspecified site: Secondary | ICD-10-CM

## 2020-05-29 DIAGNOSIS — M0579 Rheumatoid arthritis with rheumatoid factor of multiple sites without organ or systems involvement: Secondary | ICD-10-CM

## 2020-05-29 DIAGNOSIS — Z720 Tobacco use: Secondary | ICD-10-CM

## 2020-05-29 DIAGNOSIS — M170 Bilateral primary osteoarthritis of knee: Secondary | ICD-10-CM

## 2020-05-29 DIAGNOSIS — M8000XG Age-related osteoporosis with current pathological fracture, unspecified site, subsequent encounter for fracture with delayed healing: Secondary | ICD-10-CM

## 2020-05-29 DIAGNOSIS — E785 Hyperlipidemia, unspecified: Secondary | ICD-10-CM

## 2020-05-29 DIAGNOSIS — M71122 Other infective bursitis, left elbow: Secondary | ICD-10-CM

## 2020-06-07 ENCOUNTER — Ambulatory Visit (INDEPENDENT_AMBULATORY_CARE_PROVIDER_SITE_OTHER): Payer: 59

## 2020-06-07 ENCOUNTER — Ambulatory Visit (INDEPENDENT_AMBULATORY_CARE_PROVIDER_SITE_OTHER): Payer: 59 | Admitting: Podiatry

## 2020-06-07 ENCOUNTER — Other Ambulatory Visit: Payer: Self-pay

## 2020-06-07 ENCOUNTER — Other Ambulatory Visit: Payer: Self-pay | Admitting: Podiatry

## 2020-06-07 ENCOUNTER — Encounter: Payer: Self-pay | Admitting: Podiatry

## 2020-06-07 DIAGNOSIS — E1042 Type 1 diabetes mellitus with diabetic polyneuropathy: Secondary | ICD-10-CM

## 2020-06-07 DIAGNOSIS — Z9889 Other specified postprocedural states: Secondary | ICD-10-CM

## 2020-06-07 DIAGNOSIS — I739 Peripheral vascular disease, unspecified: Secondary | ICD-10-CM

## 2020-06-07 DIAGNOSIS — T8189XA Other complications of procedures, not elsewhere classified, initial encounter: Secondary | ICD-10-CM

## 2020-06-07 NOTE — Progress Notes (Signed)
Subjective:  Patient ID: [name], male    DOB: Oct 18, 1965,  MRN: [id number] HPI Chief Complaint  Patient presents with  . Diabetes    Foot Exam - Dr. Philip Aspen (PCP) referred - patient had surgery for a 5th met fracture left foot by Dr. Rush Farmer in August 2021, incision still scabs and is discolored, also notices red, splotchy discoloration on both feet, more so around the toes, he is diabetic, a1c 2 months ago was 6.0, works as a cop at [employer]  . New Patient (Initial Visit)    54 y.o. male presents with the above complaint.   ROS: Denies fever chills nausea vomiting muscle aches pains calf pain back pain chest pain shortness of breath.  Past Medical History:  Diagnosis Date  . Anginal pain ([place])   . Anxiety   . Arthritis    RA IN HANDS  . Asthma   . CAD (coronary artery disease) 04/27/2014  . Chronic renal disease, stage 3, moderately decreased glomerular filtration rate between 30-59 mL/min/1.73 square meter (HCC) 05/03/2014  . COPD (chronic obstructive pulmonary disease) ([name])   . Coronary artery disease   . Diabetes ([place]) 05/03/2014  . Diabetes mellitus without complication ([place])    type 1  . Hyperlipidemia   . Left shoulder pain   . Rheumatoid arthritis involving multiple joints ([place]) 05/03/2014  . Shortness of breath   . Sleep apnea    mild OSA, no CPAP  . Unstable angina pectoris ([place]) 04/25/2014   Past Surgical History:  Procedure Laterality Date  . CARDIAC CATHETERIZATION  04/25/2014   BY DR Einar Gip  . CARDIAC CATHETERIZATION N/A 10/30/2015   Procedure: Left Heart Cath and Cors/Grafts Angiography;  Surgeon: Adrian Prows, MD;  Location: [facility];  Service: Cardiovascular;  Laterality: N/A;  . CARPAL TUNNEL RELEASE    . CATARACT EXTRACTION, BILATERAL    . CORONARY ARTERY BYPASS GRAFT N/A 04/27/2014   Procedure: CORONARY ARTERY BYPASS GRAFTING on pump using left internal mammary artery to LAD coronary artery, right great saphenous vein graft to  diagonal coronary artery with sequential to OM1 and circumflex coronary arteries. Right greater saphenous vein graft to posterior descending coronary artery. ;  Surgeon: Grace Isaac, MD;  Location: [facility];  Service: Open Heart Surgery;  Laterality: N  . elbow drained Left 05/09/2019  . ENDOVEIN HARVEST OF GREATER SAPHENOUS VEIN Right 04/27/2014   Procedure: ENDOVEIN HARVEST OF GREATER SAPHENOUS VEIN;  Surgeon: Grace Isaac, MD;  Location: [facility];  Service: Open Heart Surgery;  Laterality: Right;  . EXCISION ORAL TUMOR N/A 03/01/2018   Procedure: EXCISION ORAL TUMOR;  Surgeon: Melida Quitter, MD;  Location: [facility];  Service: ENT;  Laterality: N/A;  . EYE SURGERY     LASER  . EYE SURGERY Bilateral    astigmatism correction   . INTRAOPERATIVE TRANSESOPHAGEAL ECHOCARDIOGRAM N/A 04/27/2014   Procedure: INTRAOPERATIVE TRANSESOPHAGEAL ECHOCARDIOGRAM;  Surgeon: Grace Isaac, MD;  Location: [facility];  Service: Open Heart Surgery;  Laterality: N/A;  . LEFT HEART CATHETERIZATION WITH CORONARY ANGIOGRAM N/A 04/25/2014   Procedure: LEFT HEART CATHETERIZATION WITH CORONARY ANGIOGRAM;  Surgeon: Laverda Page, MD;  Location: [facility] CATH LAB;  Service: Cardiovascular;  Laterality: N/A;  . MOUTH SURGERY  11/15/2017   tongue surgery   . ORIF TOE FRACTURE Left 03/22/2020   Procedure: OPEN REDUCTION INTERNAL FIXATION (ORIF) Non Union 5th Metatarsal;  Surgeon: Mcarthur Rossetti, MD;  Location: [facility];  Service:  Orthopedics;  Laterality: Left;    Current Outpatient Medications:  .  albuterol (VENTOLIN HFA) 108 (90 Base) MCG/ACT inhaler, Inhale 2 puffs into the lungs every 6 (six) hours as needed for wheezing. , Disp: , Rfl:  .  amLODipine (NORVASC) 5 MG tablet, Take 1 tablet (5 mg total) by mouth daily., Disp: 90 tablet, Rfl: 2 .  ANORO ELLIPTA 62.5-25 MCG/INH AEPB, Inhale 1 puff into the lungs daily as needed for wheezing., Disp: , Rfl:  .  Ascorbic Acid (VITAMIN C)  1000 MG tablet, Take 1,000 mg by mouth daily., Disp: , Rfl:  .  aspirin 81 MG tablet, Take 81 mg by mouth daily., Disp: , Rfl:  .  bisoprolol (ZEBETA) 5 MG tablet, Take 1 tablet (5 mg total) by mouth daily., Disp: 30 tablet, Rfl: 3 .  Blood Glucose Monitoring Suppl (ONETOUCH VERIO FLEX SYSTEM) w/Device KIT, USE TO CHECK BLOOD GLUCOSE UP TO 10 TIMES PER DAY, Disp: , Rfl:  .  buPROPion (WELLBUTRIN SR) 150 MG 12 hr tablet, Take 1 tablet (150 mg total) by mouth daily., Disp: 90 tablet, Rfl: 3 .  Cholecalciferol (VITAMIN D3) 125 MCG (5000 UT) TABS, Take 5,000 mg by mouth daily. , Disp: , Rfl:  .  Continuous Blood Gluc Sensor (FREESTYLE LIBRE 14 DAY SENSOR) MISC, CHANGE EVERY 14 DAYS TO MONITOR BLOOD GLUCOSE, Disp: , Rfl:  .  Continuous Blood Gluc Sensor (FREESTYLE LIBRE 14 DAY SENSOR) MISC, CHANGE EVERY 14 DAYS TO MONITOR BLOOD GLUOSE, Disp: , Rfl:  .  Cyanocobalamin (VITAMIN B-12) 500 MCG LOZG, Take 500 mg by mouth daily. , Disp: , Rfl:  .  doxycycline (VIBRA-TABS) 100 MG tablet, Take 1 tablet (100 mg total) by mouth 2 (two) times daily., Disp: 30 tablet, Rfl: 0 .  FLORASTOR 250 MG capsule, Take 250 mg by mouth 2 (two) times daily., Disp: , Rfl:  .  gabapentin (NEURONTIN) 300 MG capsule, Take 1 capsule (300 mg total) by mouth at bedtime., Disp: 30 capsule, Rfl: 2 .  glucose blood (ACCU-CHEK AVIVA PLUS) test strip, , Disp: , Rfl:  .  HUMALOG 100 UNIT/ML injection, Inject 10-20 Units into the skin 4 (four) times daily. Sliding scale  Depending on carb intake, Disp: , Rfl:  .  insulin glargine (LANTUS) 100 UNIT/ML injection, Inject 14-20 Units into the skin at bedtime. Take 20 units in the morning and 14 unit at bedtime, Disp: , Rfl:  .  LINZESS 145 MCG CAPS capsule, Take 145 mcg by mouth daily., Disp: , Rfl:  .  magnesium gluconate (MAGONATE) 500 MG tablet, Take 500 mg by mouth daily. , Disp: , Rfl:  .  nitroGLYCERIN (NITROSTAT) 0.4 MG SL tablet, DISSOLVE 1 TABLET UNDER THE TONGUE EVERY 5 MINUTES AS   NEEDED FOR CHEST PAIN. Vashti Bolanos  OF 3 TABLETS IN 15 MINUTES. CALL 911 IF PAIN PERSISTS. (Patient taking differently: Place 0.4 mg under the tongue every 5 (five) minutes as needed for chest pain. ), Disp: 100 tablet, Rfl: 3 .  ondansetron (ZOFRAN ODT) 4 MG disintegrating tablet, Take 1 tablet (4 mg total) by mouth every 8 (eight) hours as needed for nausea or vomiting., Disp: 20 tablet, Rfl: 0 .  oxyCODONE-acetaminophen (PERCOCET) 5-325 MG tablet, Take 1-2 tablets by mouth every 6 (six) hours as needed for severe pain., Disp: 30 tablet, Rfl: 0 .  oxymetazoline (AFRIN) 0.05 % nasal spray, Place 2 sprays into both nostrils at bedtime., Disp: , Rfl:  .  sildenafil (REVATIO) 20 MG tablet, Take 20  mg by mouth daily., Disp: , Rfl:  .  tadalafil (CIALIS) 20 MG tablet, Take 20 mg by mouth daily as needed for erectile dysfunction. , Disp: , Rfl:  .  Tocilizumab (ACTEMRA ACTPEN) 162 MG/0.9ML SOAJ, Inject 162 mg into the skin every 14 (fourteen) days., Disp: 5.4 mL, Rfl: 0  Allergies  Allergen Reactions  . Metoprolol Other (See Comments)    Diarrhea   Review of Systems Objective:  There were no vitals filed for this visit.  General: Well developed, nourished, in no acute distress, alert and oriented x3   Dermatological: Skin is warm, dry and supple bilateral. Nails x 10 are well maintained; remaining integument appears unremarkable at this time. There are no open sores, no preulcerative lesions, no rash or signs of infection present.  No open lesions or wounds he currently has a healing incision with some hyperkeratosis overlying the incision that has not quite fallen off yet but appears to be loosening up and see no signs of infection at the surgical site.  He does have some hemosiderin deposition across the dorsal aspect of the foot and the tips of the toes are mildly erythematous  Vascular: Dorsalis Pedis artery and Posterior Tibial artery pedal pulses are 2/4 bilateral with immedate capillary fill time.  Pedal hair growth present. No varicosities and no lower extremity edema present bilateral.  Tips of the toes are mildly erythematous minimal edema about the left ankle.  Is nonpitting in nature.  Hemosiderin deposition dorsal aspect of the foot multiple varicosities medial aspect of each foot right appears to be worse than the left.  Reviewed previous ABIs March 2021 which demonstrated noncompressible arteries and abnormal low pressures to the hallux bilaterally.  Neruologic: Grossly intact via light touch bilateral. Vibratory intact via tuning fork bilateral. Protective threshold with Semmes Wienstein monofilament intact to all pedal sites bilateral. Patellar and Achilles deep tendon reflexes 2+ bilateral. No Babinski or clonus noted bilateral.   Musculoskeletal: No gross boney pedal deformities bilateral. No pain, crepitus, or limitation noted with foot and ankle range of motion bilateral. Muscular strength 5/5 in all groups tested bilateral.  Well-healing surgical foot left  Gait: Unassisted, Nonantalgic.    Radiographs:  Radiographs taken today demonstrate a screw to the fifth metatarsal with bone graft apparently incorporating very nicely at this point see no signs of osteomyelitic change internal fixation does not appear to be loosening.  Soft tissue margins are relatively normal he does have some soft tissue swelling the dorsal aspect of the foot on lateral view left.  Also there are calcified arteries particular dorsalis pedis left.  Assessment & Plan:   Assessment: Diabetic peripheral neuropathy diabetic vascular disease well-healing surgical foot left.  Plan: He will continue his gabapentin for his neuropathy.  He will continue being careful with his foot until the fifth metatarsal and graft are completely consolidated and incorporated.  After this occurs and the swelling has subsided he will start walking to help increase blood flow to the legs and feet.  I will follow-up with him in  6 months to make sure he is doing well.  But for now I see absolutely no significant complications of his long-term diabetes mellitus.     [name] T. Atkins, [place]

## 2020-06-10 ENCOUNTER — Other Ambulatory Visit: Payer: Self-pay | Admitting: Cardiology

## 2020-06-10 DIAGNOSIS — I10 Essential (primary) hypertension: Secondary | ICD-10-CM

## 2020-06-10 DIAGNOSIS — I2510 Atherosclerotic heart disease of native coronary artery without angina pectoris: Secondary | ICD-10-CM

## 2020-06-11 ENCOUNTER — Other Ambulatory Visit: Payer: Self-pay

## 2020-06-11 DIAGNOSIS — I10 Essential (primary) hypertension: Secondary | ICD-10-CM

## 2020-06-11 DIAGNOSIS — I2510 Atherosclerotic heart disease of native coronary artery without angina pectoris: Secondary | ICD-10-CM

## 2020-06-11 MED ORDER — BISOPROLOL FUMARATE 5 MG PO TABS: 5.0000 mg | ORAL_TABLET | Freq: Every day | ORAL | 3 refills | Status: DC

## 2020-06-12 ENCOUNTER — Ambulatory Visit (INDEPENDENT_AMBULATORY_CARE_PROVIDER_SITE_OTHER): Payer: 59 | Admitting: Physician Assistant

## 2020-06-12 ENCOUNTER — Encounter: Payer: Self-pay | Admitting: Physician Assistant

## 2020-06-12 DIAGNOSIS — S92355K Nondisplaced fracture of fifth metatarsal bone, left foot, subsequent encounter for fracture with nonunion: Secondary | ICD-10-CM

## 2020-06-12 NOTE — Progress Notes (Signed)
Office Visit Note   Patient: [name]           Date of Birth: Mar 05, 1966           MRN: [id number] Visit Date: 06/12/2020              Requested by: [name], MD [address] [place],  [place] [address] PCP: [name], MD  No chief complaint on file.     HPI: Patient presents today status post ORIF fifth metatarsal fracture He is wearing his regular shoes and compression socks.  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  He just visited with his primary care doctor and reports that his hemoglobin A1c is down to 5.6 Assessment & Plan: Visit Diagnoses: No diagnosis found.  Plan: Should wear supportive shoes and socks.  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  He discussed his position and he does not engage in high impact activities during his day.  Should follow-up in 2 months  Follow-Up Instructions: No follow-ups on file.   Ortho Exam  Patient is alert, oriented, no adenopathy, well-dressed, normal affect, normal respiratory effort. Focused examination of his left foot demonstrates no soft tissue swelling just mild discoloration strong dorsalis pedis pulse the lateral incision just has a very thin eschar on one end of it.  No surrounding cellulitis drainage or odor  Imaging: No results found. No images are attached to the encounter.  Labs: Lab Results  Component Value Date   HGBA1C 7.4 (H) 04/25/2014   HGBA1C 7.4 (H) 04/25/2014   ESRSEDRATE 9 09/26/2019   ESRSEDRATE 12 09/09/2017   ESRSEDRATE 58 (H) 06/01/2014     Lab Results  Component Value Date   ALBUMIN 3.7 03/15/2018   ALBUMIN 3.7 09/09/2017   ALBUMIN 4.2 03/30/2017    Lab Results  Component Value Date   MG 2.3 04/28/2014   MG 2.4 04/28/2014   MG 2.7 (H) 04/27/2014   Lab Results  Component Value Date   VD25OH 57 06/22/2019    No results found for: PREALBUMIN CBC EXTENDED Latest Ref Rng & Units 12/19/2019 09/26/2019 06/22/2019  WBC 3.8 - 10.8  Thousand/uL 10.5 9.6 10.0  RBC 4.20 - 5.80 Million/uL 4.27 4.46 4.41  HGB 13.2 - 17.1 g/dL 13.1(L) 13.7 13.2  HCT 38 - 50 % 39.4 41.4 40.7  PLT 140 - 400 Thousand/uL 251 270 260  NEUTROABS 1,500 - 7,800 cells/uL 5,660 5,386 5,420  LYMPHSABS 850 - 3,900 cells/uL 3,507 2,957 3,390     There is no height or weight on file to calculate BMI.  Orders:  No orders of the defined types were placed in this encounter.  No orders of the defined types were placed in this encounter.    Procedures: No procedures performed  Clinical Data: No additional findings.  ROS:  All other systems negative, except as noted in the HPI. Review of Systems  Objective: Vital Signs: There were no vitals taken for this visit.  Specialty Comments:  No specialty comments available.  PMFS History: Patient Active Problem List   Diagnosis Date Noted  . Drug therapy 05/04/2020  . 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 Osteopenia of multiple sites 02/28/2020  . PVD (peripheral vascular disease) (HCC) 10/11/2019  . Chronic venous insufficiency 10/05/2018  . Diabetic cataract (HCC) 05/21/2018  . Controlled type 1 diabetes mellitus with diabetic peripheral angiopathy without gangrene (HCC) 08/14/2017  . Dyslipidemia 03/26/2017  .  High risk medication use 09/23/2016  . History of hypertension 09/23/2016  . History of chronic kidney disease 09/23/2016  . History of coronary artery disease 09/23/2016  . Tobacco abuse 09/23/2016  . Primary osteoarthritis of both knees 09/23/2016  . History of diabetes mellitus 09/23/2016  . 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 (HCC) 11/20/2015  . Chest pain with high risk for cardiac etiology 10/29/2015  . Asymptomatic bilateral carotid artery stenosis 06/08/2014  . Pleural effusion, bilateral 06/03/2014  . Dyspnea 06/01/2014  .  Diabetes (HCC) 05/03/2014  . 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/03/2014  . 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 Chronic renal disease, stage 3, moderately decreased glomerular filtration rate between 30-59 mL/min/1.73 square meter (HCC) 05/03/2014  . CAD (coronary artery disease) 04/27/2014  . Acne 12/19/2013  . On isotretinoin therapy 12/19/2013  . Nuclear sclerotic cataract, bilateral 12/19/2011  . Vitreous hemorrhage (HCC) 06/16/2011   Past Medical History:  Diagnosis Date  . Anginal pain (HCC)   . Anxiety   . Arthritis    RA IN HANDS  . Asthma   . CAD (coronary artery disease) 04/27/2014  . Chronic renal disease, stage 3, moderately decreased glomerular filtration rate between 30-59 mL/min/1.73 square meter (HCC) 05/03/2014  . COPD (chronic obstructive pulmonary disease) (HCC)   . Coronary artery disease   . Diabetes (HCC) 05/03/2014  . Diabetes mellitus without complication (HCC)    type 1  . Hyperlipidemia   . Left shoulder pain   . 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/03/2014  . Shortness of breath   . Sleep apnea    mild OSA, no CPAP  . Unstable angina pectoris (HCC) 04/25/2014    Family History  Problem Relation Age of Onset  . Prostate cancer Father   . Heart disease Father   . Stomach cancer Mother   . Heart disease Brother   . Asthma Daughter   . Healthy Daughter     Past Surgical History:  Procedure Laterality Date  . CARDIAC CATHETERIZATION  04/25/2014   BY DR Jacinto Halim  . CARDIAC CATHETERIZATION N/A 10/30/2015   Procedure: Left Heart Cath and Cors/Grafts Angiography;  Surgeon: Yates Decamp, MD;  Location: [facility];  Service: Cardiovascular;  Laterality: N/A;  . CARPAL TUNNEL RELEASE    . CATARACT EXTRACTION, BILATERAL    . CORONARY ARTERY BYPASS GRAFT N/A 04/27/2014   Procedure: CORONARY ARTERY BYPASS GRAFTING on pump using left internal mammary artery to LAD coronary artery, right great saphenous vein graft to diagonal  coronary artery with sequential to OM1 and circumflex coronary arteries. Right greater saphenous vein graft to posterior descending coronary artery. ;  Surgeon: Delight Ovens, MD;  Location: [facility] OR;  Service: Open Heart Surgery;  Laterality: N  . elbow drained Left 05/09/2019  . ENDOVEIN HARVEST OF GREATER SAPHENOUS VEIN Right 04/27/2014   Procedure: ENDOVEIN HARVEST OF GREATER SAPHENOUS VEIN;  Surgeon: Delight Ovens, MD;  Location: [facility] OR;  Service: Open Heart Surgery;  Laterality: Right;  . EXCISION ORAL TUMOR N/A 03/01/2018   Procedure: EXCISION ORAL TUMOR;  Surgeon: Christia Reading, MD;  Location: [facility];  Service: ENT;  Laterality: N/A;  . EYE SURGERY     LASER  . EYE SURGERY Bilateral    astigmatism correction   . INTRAOPERATIVE TRANSESOPHAGEAL ECHOCARDIOGRAM N/A 04/27/2014   Procedure: INTRAOPERATIVE TRANSESOPHAGEAL ECHOCARDIOGRAM;  Surgeon: Delight Ovens, MD;  Location:  [facility] OR;  Service: Open Heart Surgery;  Laterality: N/A;  . LEFT HEART CATHETERIZATION WITH CORONARY ANGIOGRAM N/A 04/25/2014   Procedure: LEFT HEART CATHETERIZATION WITH CORONARY ANGIOGRAM;  Surgeon: Pamella Pert, MD;  Location: [facility] CATH LAB;  Service: Cardiovascular;  Laterality: N/A;  . MOUTH SURGERY  11/15/2017   tongue surgery   . ORIF TOE FRACTURE Left 03/22/2020   Procedure: OPEN REDUCTION INTERNAL FIXATION (ORIF) Non Union 5th Metatarsal;  Surgeon: Kathryne Hitch, MD;  Location: [facility];  Service: Orthopedics;  Laterality: Left;   Social History   Occupational History  . Not on file  Tobacco Use  . Smoking status: Current Every Day Smoker    Packs/day: 1.00    Years: 34.00    Pack years: 34.00    Types: Cigarettes  . Smokeless tobacco: Never Used  Vaping Use  . Vaping Use: Never used  Substance and Sexual Activity  . Alcohol use: Yes    Alcohol/week: 0.0 standard drinks    Comment: RARE  . Drug use: No  . Sexual activity: Not on file

## 2020-06-24 ENCOUNTER — Encounter: Payer: Self-pay | Admitting: Orthopedic Surgery

## 2020-06-25 ENCOUNTER — Encounter: Payer: Self-pay | Admitting: Orthopedic Surgery

## 2020-06-25 ENCOUNTER — Ambulatory Visit (INDEPENDENT_AMBULATORY_CARE_PROVIDER_SITE_OTHER): Payer: 59 | Admitting: Orthopedic Surgery

## 2020-06-25 VITALS — Ht 68.0 in | Wt 195.0 lb

## 2020-06-25 DIAGNOSIS — S92352K Displaced fracture of fifth metatarsal bone, left foot, subsequent encounter for fracture with nonunion: Secondary | ICD-10-CM

## 2020-06-25 MED ORDER — LEVOFLOXACIN 750 MG PO TABS: 750.0000 mg | ORAL_TABLET | Freq: Every day | ORAL | 0 refills | Status: DC

## 2020-06-25 MED ORDER — DOXYCYCLINE HYCLATE 100 MG PO TABS: 100.0000 mg | ORAL_TABLET | Freq: Two times a day (BID) | ORAL | 0 refills | Status: DC

## 2020-06-25 NOTE — Progress Notes (Signed)
Office Visit Note   Patient: [name]           Date of Birth: 06-29-1966           MRN: [id number] Visit Date: 06/25/2020              Requested by: [name], MD [address] [place],  [place] [address] PCP: [name], MD  Chief Complaint  Patient presents with  . Left Foot - Pain    03/22/2020 ORIF left 5th MT nonunion      HPI: Patient is a 54 year old gentleman who presents 3 months status post open reduction internal fixation for nonunion Jones fracture left foot.  Patient states that just recently over the past 3 days he has noticed some redness and swelling states it is worse yesterday he has been on doxycycline as well as probiotics.  Assessment & Plan: Visit Diagnoses:  1. 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  Recommend continue probiotics.  Discussed that with his peripheral vascular disease he is at risk of developing a deep infection from the bone graft or hardware.  Discussed that if this redness does not resolve with oral antibiotics we would need to consider removal of the hardware and sending bone biopsy for cultures with anticipated long-term IV antibiotics for 6 weeks.  Patient states he reversed for not removing the hardware or scraping the bone and just be placed on IV antibiotics.  Plan for three-view radiographs of the left foot at follow-up.  Follow-Up Instructions: Return in about 2 weeks (around 07/09/2020).   Ortho Exam  Patient is alert, oriented, no adenopathy, well-dressed, normal affect, normal respiratory effort. Examination there is an area of approximately 1 cm of redness around the incision this is tender to palpation there is no drainage he has a palpable dorsalis pedis pulse there is no ascending cellulitis there is no drainage.  Imaging: No results found. No images are attached to the encounter.  Labs: Lab Results  Component Value  Date   HGBA1C 7.4 (H) 04/25/2014   HGBA1C 7.4 (H) 04/25/2014   ESRSEDRATE 9 09/26/2019   ESRSEDRATE 12 09/09/2017   ESRSEDRATE 58 (H) 06/01/2014     Lab Results  Component Value Date   ALBUMIN 3.7 03/15/2018   ALBUMIN 3.7 09/09/2017   ALBUMIN 4.2 03/30/2017    Lab Results  Component Value Date   MG 2.3 04/28/2014   MG 2.4 04/28/2014   MG 2.7 (H) 04/27/2014   Lab Results  Component Value Date   VD25OH 57 06/22/2019    No results found for: PREALBUMIN CBC EXTENDED Latest Ref Rng & Units 12/19/2019 09/26/2019 06/22/2019  WBC 3.8 - 10.8 Thousand/uL 10.5 9.6 10.0  RBC 4.20 - 5.80 Million/uL 4.27 4.46 4.41  HGB 13.2 - 17.1 g/dL 13.1(L) 13.7 13.2  HCT 38 - 50 % 39.4 41.4 40.7  PLT 140 - 400 Thousand/uL 251 270 260  NEUTROABS 1,500 - 7,800 cells/uL 5,660 5,386 5,420  LYMPHSABS 850 - 3,900 cells/uL 3,507 2,957 3,390     Body mass index is 29.65 kg/m.  Orders:  No orders of the defined types were placed in this encounter.  Meds ordered this encounter  Medications  . doxycycline (VIBRA-TABS) 100 MG tablet    Sig: Take 1 tablet (100 mg total) by mouth 2 (two) times daily.    Dispense:  60 tablet    Refill:  0  .  levofloxacin (LEVAQUIN) 750 MG tablet    Sig: Take 1 tablet (750 mg total) by mouth daily.    Dispense:  30 tablet    Refill:  0     Procedures: No procedures performed  Clinical Data: No additional findings.  ROS:  All other systems negative, except as noted in the HPI. Review of Systems  Objective: Vital Signs: Ht 5\' 8"  (1.727 m)   Wt 195 lb (88.5 kg)   BMI 29.65 kg/m   Specialty Comments:  No specialty comments available.  PMFS History: Patient Active Problem List   Diagnosis Date Noted  . Drug therapy 05/04/2020  . 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 Osteopenia of multiple sites 02/28/2020  . PVD (peripheral vascular disease) (HCC) 10/11/2019  . Chronic venous insufficiency 10/05/2018  .  Diabetic cataract (HCC) 05/21/2018  . Controlled type 1 diabetes mellitus with diabetic peripheral angiopathy without gangrene (HCC) 08/14/2017  . Dyslipidemia 03/26/2017  . High risk medication use 09/23/2016  . History of hypertension 09/23/2016  . History of chronic kidney disease 09/23/2016  . History of coronary artery disease 09/23/2016  . Tobacco abuse 09/23/2016  . Primary osteoarthritis of both knees 09/23/2016  . History of diabetes mellitus 09/23/2016  . 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 (HCC) 11/20/2015  . Chest pain with high risk for cardiac etiology 10/29/2015  . Asymptomatic bilateral carotid artery stenosis 06/08/2014  . Pleural effusion, bilateral 06/03/2014  . Dyspnea 06/01/2014  . Diabetes (HCC) 05/03/2014  . 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/03/2014  . 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 Chronic renal disease, stage 3, moderately decreased glomerular filtration rate between 30-59 mL/min/1.73 square meter (HCC) 05/03/2014  . CAD (coronary artery disease) 04/27/2014  . Acne 12/19/2013  . On isotretinoin therapy 12/19/2013  . Nuclear sclerotic cataract, bilateral 12/19/2011  . Vitreous hemorrhage (HCC) 06/16/2011   Past Medical History:  Diagnosis Date  . Anginal pain (HCC)   . Anxiety   . Arthritis    RA IN HANDS  . Asthma   . CAD (coronary artery disease) 04/27/2014  . Chronic renal disease, stage 3, moderately decreased glomerular filtration rate between 30-59 mL/min/1.73 square meter (HCC) 05/03/2014  . COPD (chronic obstructive pulmonary disease) (HCC)   . Coronary artery disease   . Diabetes (HCC) 05/03/2014  . Diabetes mellitus without complication (HCC)    type 1  . Hyperlipidemia   . Left shoulder pain   . 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/03/2014  . Shortness of breath   . Sleep apnea    mild OSA, no CPAP  .  Unstable angina pectoris (HCC) 04/25/2014    Family History  Problem Relation Age of Onset  . Prostate cancer Father   . Heart disease Father   . Stomach cancer Mother   . Heart disease Brother   . Asthma Daughter   . Healthy Daughter     Past Surgical History:  Procedure Laterality Date  . CARDIAC CATHETERIZATION  04/25/2014   BY DR Jacinto Halim  . CARDIAC CATHETERIZATION N/A 10/30/2015   Procedure: Left Heart Cath and Cors/Grafts Angiography;  Surgeon: Yates Decamp, MD;  [facility] INVASIVE CV LAB;  Service: Cardiovascular;  Laterality: N/A;  . CARPAL TUNNEL RELEASE    . CATARACT EXTRACTION, BILATERAL    . CORONARY ARTERY BYPASS GRAFT N/A 04/27/2014   Procedure: CORONARY ARTERY BYPASS GRAFTING on pump using  left internal mammary artery to LAD coronary artery, right great saphenous vein graft to diagonal coronary artery with sequential to OM1 and circumflex coronary arteries. Right greater saphenous vein graft to posterior descending coronary artery. ;  Surgeon: Delight Ovens, MD;  Location: [facility] OR;  Service: Open Heart Surgery;  Laterality: N  . elbow drained Left 05/09/2019  . ENDOVEIN HARVEST OF GREATER SAPHENOUS VEIN Right 04/27/2014   Procedure: ENDOVEIN HARVEST OF GREATER SAPHENOUS VEIN;  Surgeon: Delight Ovens, MD;  Location: [facility] OR;  Service: Open Heart Surgery;  Laterality: Right;  . EXCISION ORAL TUMOR N/A 03/01/2018   Procedure: EXCISION ORAL TUMOR;  Surgeon: Christia Reading, MD;  Location: [facility];  Service: ENT;  Laterality: N/A;  . EYE SURGERY     LASER  . EYE SURGERY Bilateral    astigmatism correction   . INTRAOPERATIVE TRANSESOPHAGEAL ECHOCARDIOGRAM N/A 04/27/2014   Procedure: INTRAOPERATIVE TRANSESOPHAGEAL ECHOCARDIOGRAM;  Surgeon: Delight Ovens, MD;  Location: [facility] OR;  Service: Open Heart Surgery;  Laterality: N/A;  . LEFT HEART CATHETERIZATION WITH CORONARY ANGIOGRAM N/A 04/25/2014   Procedure: LEFT HEART CATHETERIZATION WITH CORONARY ANGIOGRAM;   Surgeon: Pamella Pert, MD;  [facility] CATH LAB;  Service: Cardiovascular;  Laterality: N/A;  . MOUTH SURGERY  11/15/2017   tongue surgery   . ORIF TOE FRACTURE Left 03/22/2020   Procedure: OPEN REDUCTION INTERNAL FIXATION (ORIF) Non Union 5th Metatarsal;  Surgeon: Kathryne Hitch, MD;  Location: [facility];  Service: Orthopedics;  Laterality: Left;   Social History   Occupational History  . Not on file  Tobacco Use  . Smoking status: Current Every Day Smoker    Packs/day: 1.00    Years: 34.00    Pack years: 34.00    Types: Cigarettes  . Smokeless tobacco: Never Used  Vaping Use  . Vaping Use: Never used  Substance and Sexual Activity  . Alcohol use: Yes    Alcohol/week: 0.0 standard drinks    Comment: RARE  . Drug use: No  . Sexual activity: Not on file

## 2020-06-27 ENCOUNTER — Other Ambulatory Visit: Payer: Self-pay | Admitting: Orthopaedic Surgery

## 2020-06-27 DIAGNOSIS — M79672 Pain in left foot: Secondary | ICD-10-CM

## 2020-07-09 ENCOUNTER — Ambulatory Visit (INDEPENDENT_AMBULATORY_CARE_PROVIDER_SITE_OTHER): Payer: 59 | Admitting: Orthopedic Surgery

## 2020-07-09 ENCOUNTER — Ambulatory Visit (INDEPENDENT_AMBULATORY_CARE_PROVIDER_SITE_OTHER): Payer: 59

## 2020-07-09 ENCOUNTER — Encounter: Payer: Self-pay | Admitting: Orthopedic Surgery

## 2020-07-09 DIAGNOSIS — M79672 Pain in left foot: Secondary | ICD-10-CM | POA: Diagnosis not present

## 2020-07-09 DIAGNOSIS — S92352K Displaced fracture of fifth metatarsal bone, left foot, subsequent encounter for fracture with nonunion: Secondary | ICD-10-CM

## 2020-07-09 NOTE — Progress Notes (Signed)
Office Visit Note   Patient: [name]           Date of Birth: 28-Apr-1966           MRN: [id number] Visit Date: 07/09/2020              Requested by: [name], MD [address] [place],  [place] [address] PCP: [name], MD  Chief Complaint  Patient presents with  . Left Foot - Pain      HPI: Patient is a 54 year old gentleman who is 2-1/2 months status post internal fixation for nonunion of a Jones fracture left foot.  Patient is currently on antibiotics wearing compression stockings.  He denies any pain denies any drainage.  Assessment & Plan: Visit Diagnoses:  1. Pain in left foot   2. 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  If there is still cellulitis we would plan for debridement of the fusion site and removal of the internal fixation.  Discussed that the most probable cause of the cellulitis is from microcirculatory dysfunction from diabetes and smoking.  Follow-Up Instructions: Return in about 1 week (around 07/16/2020).   Ortho Exam  Patient is alert, oriented, no adenopathy, well-dressed, normal affect, normal respiratory effort. Examination patient still has about a centimeter diameter of cellulitis around the incision site this is nontender to palpation the cellulitis is improved from the last exam.  Patient states that he has not smoked in 2 days he states that his CBG has improved.  Imaging: XR Foot Complete Left  Result Date: 07/09/2020 Three-view radiographs of the left foot shows stable union of the Jones fracture fifth metatarsal.  There is no lytic changes around the hardware no cystic changes at the fusion site  No images are attached to the encounter.  Labs: Lab Results  Component Value Date   HGBA1C 7.4 (H) 04/25/2014   HGBA1C 7.4 (H) 04/25/2014   ESRSEDRATE 9 09/26/2019    ESRSEDRATE 12 09/09/2017   ESRSEDRATE 58 (H) 06/01/2014     Lab Results  Component Value Date   ALBUMIN 3.7 03/15/2018   ALBUMIN 3.7 09/09/2017   ALBUMIN 4.2 03/30/2017    Lab Results  Component Value Date   MG 2.3 04/28/2014   MG 2.4 04/28/2014   MG 2.7 (H) 04/27/2014   Lab Results  Component Value Date   VD25OH 57 06/22/2019    No results found for: PREALBUMIN CBC EXTENDED Latest Ref Rng & Units 12/19/2019 09/26/2019 06/22/2019  WBC 3.8 - 10.8 Thousand/uL 10.5 9.6 10.0  RBC 4.20 - 5.80 Million/uL 4.27 4.46 4.41  HGB 13.2 - 17.1 g/dL 13.1(L) 13.7 13.2  HCT 38 - 50 % 39.4 41.4 40.7  PLT 140 - 400 Thousand/uL 251 270 260  NEUTROABS 1,500 - 7,800 cells/uL 5,660 5,386 5,420  LYMPHSABS 850 - 3,900 cells/uL 3,507 2,957 3,390     There is no height or weight on file to calculate BMI.  Orders:  Orders Placed This Encounter  Procedures  . XR Foot Complete Left   No orders of the defined types were placed in this encounter.    Procedures: No procedures performed  Clinical Data: No additional findings.  ROS:  All other systems negative, except as noted in the HPI. Review of Systems  Objective: Vital Signs: There were no vitals taken for this visit.  Specialty Comments:  No specialty comments available.  PMFS History: Patient Active Problem List   Diagnosis Date Noted  . Drug therapy 05/04/2020  . 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 Osteopenia of multiple sites 02/28/2020  . PVD (peripheral vascular disease) (HCC) 10/11/2019  . Chronic venous insufficiency 10/05/2018  . Diabetic cataract (HCC) 05/21/2018  . Controlled type 1 diabetes mellitus with diabetic peripheral angiopathy without gangrene (HCC) 08/14/2017  . Dyslipidemia 03/26/2017  . High risk medication use 09/23/2016  . History of hypertension 09/23/2016  . History of chronic kidney disease 09/23/2016  . History of coronary artery disease 09/23/2016    . Tobacco abuse 09/23/2016  . Primary osteoarthritis of both knees 09/23/2016  . History of diabetes mellitus 09/23/2016  . 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 (HCC) 11/20/2015  . Chest pain with high risk for cardiac etiology 10/29/2015  . Asymptomatic bilateral carotid artery stenosis 06/08/2014  . Pleural effusion, bilateral 06/03/2014  . Dyspnea 06/01/2014  . Diabetes (HCC) 05/03/2014  . 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/03/2014  . 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 Chronic renal disease, stage 3, moderately decreased glomerular filtration rate between 30-59 mL/min/1.73 square meter (HCC) 05/03/2014  . CAD (coronary artery disease) 04/27/2014  . Acne 12/19/2013  . On isotretinoin therapy 12/19/2013  . Nuclear sclerotic cataract, bilateral 12/19/2011  . Vitreous hemorrhage (HCC) 06/16/2011   Past Medical History:  Diagnosis Date  . Anginal pain (HCC)   . Anxiety   . Arthritis    RA IN HANDS  . Asthma   . CAD (coronary artery disease) 04/27/2014  . Chronic renal disease, stage 3, moderately decreased glomerular filtration rate between 30-59 mL/min/1.73 square meter (HCC) 05/03/2014  . COPD (chronic obstructive pulmonary disease) (HCC)   . Coronary artery disease   . Diabetes (HCC) 05/03/2014  . Diabetes mellitus without complication (HCC)    type 1  . Hyperlipidemia   . Left shoulder pain   . 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/03/2014  . Shortness of breath   . Sleep apnea    mild OSA, no CPAP  . Unstable angina pectoris (HCC) 04/25/2014    Family History  Problem Relation Age of Onset  . Prostate cancer Father   . Heart disease Father   . Stomach cancer Mother   . Heart disease Brother   . Asthma Daughter   . Healthy Daughter     Past Surgical History:  Procedure Laterality Date  . CARDIAC CATHETERIZATION  04/25/2014   BY DR  Jacinto Halim  . CARDIAC CATHETERIZATION N/A 10/30/2015   Procedure: Left Heart Cath and Cors/Grafts Angiography;  Surgeon: Yates Decamp, MD;  Location: [facility] INVASIVE CV LAB;  Service: Cardiovascular;  Laterality: N/A;  . CARPAL TUNNEL RELEASE    . CATARACT EXTRACTION, BILATERAL    . CORONARY ARTERY BYPASS GRAFT N/A 04/27/2014   Procedure: CORONARY ARTERY BYPASS GRAFTING on pump using left internal mammary artery to LAD coronary artery, right great saphenous vein graft to diagonal coronary artery with sequential to OM1 and circumflex coronary arteries. Right greater saphenous vein graft to posterior descending coronary artery. ;  Surgeon: Delight Ovens, MD;  Location: [facility] OR;  Service: Open Heart Surgery;  Laterality: N  . elbow drained Left 05/09/2019  . ENDOVEIN HARVEST OF GREATER SAPHENOUS VEIN Right 04/27/2014   Procedure: ENDOVEIN HARVEST OF GREATER SAPHENOUS VEIN;  Surgeon: Gwenith Daily  Tyrone Sage, MD;  Location: [facility] OR;  Service: Open Heart Surgery;  Laterality: Right;  . EXCISION ORAL TUMOR N/A 03/01/2018   Procedure: EXCISION ORAL TUMOR;  Surgeon: Christia Reading, MD;  Location: [facility];  Service: ENT;  Laterality: N/A;  . EYE SURGERY     LASER  . EYE SURGERY Bilateral    astigmatism correction   . INTRAOPERATIVE TRANSESOPHAGEAL ECHOCARDIOGRAM N/A 04/27/2014   Procedure: INTRAOPERATIVE TRANSESOPHAGEAL ECHOCARDIOGRAM;  Surgeon: Delight Ovens, MD;  Location: [facility] OR;  Service: Open Heart Surgery;  Laterality: N/A;  . LEFT HEART CATHETERIZATION WITH CORONARY ANGIOGRAM N/A 04/25/2014   Procedure: LEFT HEART CATHETERIZATION WITH CORONARY ANGIOGRAM;  Surgeon: Pamella Pert, MD;  Location: [facility] CATH LAB;  Service: Cardiovascular;  Laterality: N/A;  . MOUTH SURGERY  11/15/2017   tongue surgery   . ORIF TOE FRACTURE Left 03/22/2020   Procedure: OPEN REDUCTION INTERNAL FIXATION (ORIF) Non Union 5th Metatarsal;  Surgeon: Kathryne Hitch, MD;  Location:  SURGERY CENTER;  Service:  Orthopedics;  Laterality: Left;   Social History   Occupational History  . Not on file  Tobacco Use  . Smoking status: Current Every Day Smoker    Packs/day: 1.00    Years: 34.00    Pack years: 34.00    Types: Cigarettes  . Smokeless tobacco: Never Used  Vaping Use  . Vaping Use: Never used  Substance and Sexual Activity  . Alcohol use: Yes    Alcohol/week: 0.0 standard drinks    Comment: RARE  . Drug use: No  . Sexual activity: Not on file

## 2020-07-16 ENCOUNTER — Encounter: Payer: Self-pay | Admitting: Orthopedic Surgery

## 2020-07-16 ENCOUNTER — Ambulatory Visit (INDEPENDENT_AMBULATORY_CARE_PROVIDER_SITE_OTHER): Payer: 59 | Admitting: Orthopedic Surgery

## 2020-07-16 DIAGNOSIS — S92352K Displaced fracture of fifth metatarsal bone, left foot, subsequent encounter for fracture with nonunion: Secondary | ICD-10-CM | POA: Diagnosis not present

## 2020-07-16 MED ORDER — LEVOFLOXACIN 750 MG PO TABS
750.0000 mg | ORAL_TABLET | Freq: Every day | ORAL | 0 refills | Status: DC
Start: 2020-07-16 — End: 2020-09-06

## 2020-07-16 MED ORDER — DOXYCYCLINE HYCLATE 100 MG PO TABS
100.0000 mg | ORAL_TABLET | Freq: Two times a day (BID) | ORAL | 0 refills | Status: DC
Start: 2020-07-16 — End: 2020-09-06

## 2020-07-16 NOTE — Progress Notes (Signed)
Office Visit Note   Patient: [name]           Date of Birth: [birth year]           MRN: [id number] Visit Date: 07/16/2020              Requested by: [name], MD [address] [place],  [place] [address] PCP: [name], MD  Chief Complaint  Patient presents with  . Left Foot - Follow-up    03/22/20 ORIF left foot Jones fx       HPI: Patient is a 54 year old gentleman who is status post internal fixation for nonunion fifth metatarsal fracture patient has had some redness he was started on doxycycline and Levaquin.  Patient states the redness has resolved he denies any pain he is currently full weightbearing and wearing knee-high compression stockings.  Patient denies any drainage.  Assessment & Plan: Visit Diagnoses:  1. 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  Discussed that if he develops any increased redness or pain we will remove the hardware at that time.  Discussed that I would have a low threshold to remove the hardware.  Follow-Up Instructions: Return in about 4 weeks (around 08/13/2020).   Ortho Exam  Patient is alert, oriented, no adenopathy, well-dressed, normal affect, normal respiratory effort. Examination the redness over the lateral incision left foot has decreased there is no tenderness to palpation no drainage she does have some brawny edema on the dorsum of his foot from venous swelling but no cellulitis he has a good dorsalis pedis pulse.  Imaging: No results found. No images are attached to the encounter.  Labs: Lab Results  Component Value Date   HGBA1C 7.4 (H) 04/25/2014   HGBA1C 7.4 (H) 04/25/2014   ESRSEDRATE 9 09/26/2019   ESRSEDRATE 12 09/09/2017   ESRSEDRATE 58 (H) 06/01/2014     Lab Results  Component Value Date   ALBUMIN 3.7 03/15/2018   ALBUMIN 3.7 09/09/2017   ALBUMIN 4.2 03/30/2017    Lab Results   Component Value Date   MG 2.3 04/28/2014   MG 2.4 04/28/2014   MG 2.7 (H) 04/27/2014   Lab Results  Component Value Date   VD25OH 57 06/22/2019    No results found for: PREALBUMIN CBC EXTENDED Latest Ref Rng & Units 12/19/2019 09/26/2019 06/22/2019  WBC 3.8 - 10.8 Thousand/uL 10.5 9.6 10.0  RBC 4.20 - 5.80 Million/uL 4.27 4.46 4.41  HGB 13.2 - 17.1 g/dL 13.1(L) 13.7 13.2  HCT 38.5 - 50.0 % 39.4 41.4 40.7  PLT 140 - 400 Thousand/uL 251 270 260  NEUTROABS 1,500 - 7,800 cells/uL 5,660 5,386 5,420  LYMPHSABS 850 - 3,900 cells/uL 3,507 2,957 3,390     There is no height or weight on file to calculate BMI.  Orders:  No orders of the defined types were placed in this encounter.  Meds ordered this encounter  Medications  . levofloxacin (LEVAQUIN) 750 MG tablet    Sig: Take 1 tablet (750 mg total) by mouth daily.    Dispense:  30 tablet    Refill:  0  . doxycycline (VIBRA-TABS) 100 MG tablet    Sig: Take 1 tablet (100 mg total) by mouth 2 (two) times daily.    Dispense:  60 tablet    Refill:  0     Procedures: No procedures performed  Clinical Data: No  additional findings.  ROS:  All other systems negative, except as noted in the HPI. Review of Systems  Objective: Vital Signs: There were no vitals taken for this visit.  Specialty Comments:  No specialty comments available.  PMFS History: Patient Active Problem List   Diagnosis Date Noted  . Drug therapy 05/04/2020  . 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 Osteopenia of multiple sites 02/28/2020  . PVD (peripheral vascular disease) (HCC) 10/11/2019  . Chronic venous insufficiency 10/05/2018  . Diabetic cataract (HCC) 05/21/2018  . Controlled type 1 diabetes mellitus with diabetic peripheral angiopathy without gangrene (HCC) 08/14/2017  . Dyslipidemia 03/26/2017  . High risk medication use 09/23/2016  . History of hypertension 09/23/2016  . History of chronic kidney  disease 09/23/2016  . History of coronary artery disease 09/23/2016  . Tobacco abuse 09/23/2016  . Primary osteoarthritis of both knees 09/23/2016  . History of diabetes mellitus 09/23/2016  . 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 (HCC) 11/20/2015  . Chest pain with high risk for cardiac etiology 10/29/2015  . Asymptomatic bilateral carotid artery stenosis 06/08/2014  . Pleural effusion, bilateral 06/03/2014  . Dyspnea 06/01/2014  . Diabetes (HCC) 05/03/2014  . 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/03/2014  . 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 Chronic renal disease, stage 3, moderately decreased glomerular filtration rate between 30-59 mL/min/1.73 square meter (HCC) 05/03/2014  . CAD (coronary artery disease) 04/27/2014  . Acne 12/19/2013  . On isotretinoin therapy 12/19/2013  . Nuclear sclerotic cataract, bilateral 12/19/2011  . Vitreous hemorrhage (HCC) 06/16/2011   Past Medical History:  Diagnosis Date  . Anginal pain (HCC)   . Anxiety   . Arthritis    RA IN HANDS  . Asthma   . CAD (coronary artery disease) 04/27/2014  . Chronic renal disease, stage 3, moderately decreased glomerular filtration rate between 30-59 mL/min/1.73 square meter (HCC) 05/03/2014  . COPD (chronic obstructive pulmonary disease) (HCC)   . Coronary artery disease   . Diabetes (HCC) 05/03/2014  . Diabetes mellitus without complication (HCC)    type 1  . Hyperlipidemia   . Left shoulder pain   . 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/03/2014  . Shortness of breath   . Sleep apnea    mild OSA, no CPAP  . Unstable angina pectoris (HCC) 04/25/2014    Family History  Problem Relation Age of Onset  . Prostate cancer Father   . Heart disease Father   . Stomach cancer Mother   . Heart disease Brother   . Asthma Daughter   . Healthy Daughter     Past Surgical History:  Procedure  Laterality Date  . CARDIAC CATHETERIZATION  04/25/2014   BY DR Jacinto Halim  . CARDIAC CATHETERIZATION N/A 10/30/2015   Procedure: Left Heart Cath and Cors/Grafts Angiography;  Surgeon: Yates Decamp, MD;  Location: [facility] INVASIVE CV LAB;  Service: Cardiovascular;  Laterality: N/A;  . CARPAL TUNNEL RELEASE    . CATARACT EXTRACTION, BILATERAL    . CORONARY ARTERY BYPASS GRAFT N/A 04/27/2014   Procedure: CORONARY ARTERY BYPASS GRAFTING on pump using left internal mammary artery to LAD coronary artery, right great saphenous vein graft to diagonal coronary artery with sequential to OM1 and circumflex coronary arteries. Right greater saphenous vein graft to posterior descending coronary artery. ;  Surgeon: Delight Ovens, MD;  Location: [facility] OR;  Service: Open Heart Surgery;  Laterality:  N  . elbow drained Left 05/09/2019  . ENDOVEIN HARVEST OF GREATER SAPHENOUS VEIN Right 04/27/2014   Procedure: ENDOVEIN HARVEST OF GREATER SAPHENOUS VEIN;  Surgeon: Delight Ovens, MD;  Location: [facility] OR;  Service: Open Heart Surgery;  Laterality: Right;  . EXCISION ORAL TUMOR N/A 03/01/2018   Procedure: EXCISION ORAL TUMOR;  Surgeon: Christia Reading, MD;  Location: [facility];  Service: ENT;  Laterality: N/A;  . EYE SURGERY     LASER  . EYE SURGERY Bilateral    astigmatism correction   . INTRAOPERATIVE TRANSESOPHAGEAL ECHOCARDIOGRAM N/A 04/27/2014   Procedure: INTRAOPERATIVE TRANSESOPHAGEAL ECHOCARDIOGRAM;  Surgeon: Delight Ovens, MD;  Location: [facility] OR;  Service: Open Heart Surgery;  Laterality: N/A;  . LEFT HEART CATHETERIZATION WITH CORONARY ANGIOGRAM N/A 04/25/2014   Procedure: LEFT HEART CATHETERIZATION WITH CORONARY ANGIOGRAM;  Surgeon: Pamella Pert, MD;  Location: [facility] CATH LAB;  Service: Cardiovascular;  Laterality: N/A;  . MOUTH SURGERY  11/15/2017   tongue surgery   . ORIF TOE FRACTURE Left 03/22/2020   Procedure: OPEN REDUCTION INTERNAL FIXATION (ORIF) Non Union 5th Metatarsal;  Surgeon: Kathryne Hitch, MD;  Location: [facility];  Service: Orthopedics;  Laterality: Left;   Social History   Occupational History  . Not on file  Tobacco Use  . Smoking status: Current Every Day Smoker    Packs/day: 1.00    Years: 34.00    Pack years: 34.00    Types: Cigarettes  . Smokeless tobacco: Never Used  Vaping Use  . Vaping Use: Never used  Substance and Sexual Activity  . Alcohol use: Yes    Alcohol/week: 0.0 standard drinks    Comment: RARE  . Drug use: No  . Sexual activity: Not on file

## 2020-07-21 ENCOUNTER — Other Ambulatory Visit: Payer: Self-pay | Admitting: Rheumatology

## 2020-07-22 NOTE — Telephone Encounter (Signed)
I reviewed his chart. He switched his care to [facility] rheumatology. There is no fu with our office in the chart. Rx declined.

## 2020-08-13 ENCOUNTER — Ambulatory Visit (INDEPENDENT_AMBULATORY_CARE_PROVIDER_SITE_OTHER): Payer: 59 | Admitting: Orthopedic Surgery

## 2020-08-13 ENCOUNTER — Ambulatory Visit: Payer: 59 | Admitting: Physician Assistant

## 2020-08-13 ENCOUNTER — Encounter: Payer: Self-pay | Admitting: Orthopedic Surgery

## 2020-08-13 DIAGNOSIS — S92352K Displaced fracture of fifth metatarsal bone, left foot, subsequent encounter for fracture with nonunion: Secondary | ICD-10-CM

## 2020-08-13 NOTE — Progress Notes (Signed)
Office Visit Note   Patient: [name]           Date of Birth: [birth year]           MRN: [id number] Visit Date: 08/13/2020              Requested by: [name], MD [address] [place],  [place] [address] PCP: [name], MD  Chief Complaint  Patient presents with  . Left Foot - Follow-up    03/22/20 ORIF left foot Jones fx       HPI: Patient is a 55 year old gentleman who is almost 5 months out from open reduction internal fixation left foot Jones fracture.  Patient is completing his course of doxycycline and Levaquin he is full weightbearing in regular shoes.  Assessment & Plan: Visit Diagnoses:  1. 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  Patient was provided a silicone toe spacer for the great toe.  Follow-Up Instructions: Return in about 4 weeks (around 09/10/2020).   Ortho Exam  Patient is alert, oriented, no adenopathy, well-dressed, normal affect, normal respiratory effort. Examination patient does have a venous changes in the foot and leg there is some mild redness over the incision this is nontender to palpation most the redness resolves with scar massage.  Imaging: No results found. No images are attached to the encounter.  Labs: Lab Results  Component Value Date   HGBA1C 7.4 (H) 04/25/2014   HGBA1C 7.4 (H) 04/25/2014   ESRSEDRATE 9 09/26/2019   ESRSEDRATE 12 09/09/2017   ESRSEDRATE 58 (H) 06/01/2014     Lab Results  Component Value Date   ALBUMIN 3.7 03/15/2018   ALBUMIN 3.7 09/09/2017   ALBUMIN 4.2 03/30/2017    Lab Results  Component Value Date   MG 2.3 04/28/2014   MG 2.4 04/28/2014   MG 2.7 (H) 04/27/2014   Lab Results  Component Value Date   VD25OH 57 06/22/2019    No  results found for: PREALBUMIN CBC EXTENDED Latest Ref Rng & Units 12/19/2019 09/26/2019 06/22/2019  WBC 3.8 - 10.8 Thousand/uL 10.5 9.6 10.0  RBC 4.20 - 5.80 Million/uL 4.27 4.46 4.41  HGB 13.2 - 17.1 g/dL 13.1(L) 13.7 13.2  HCT 38.5 - 50.0 % 39.4 41.4 40.7  PLT 140 - 400 Thousand/uL 251 270 260  NEUTROABS 1,500 - 7,800 cells/uL 5,660 5,386 5,420  LYMPHSABS 850 - 3,900 cells/uL 3,507 2,957 3,390     There is no height or weight on file to calculate BMI.  Orders:  No orders of the defined types were placed in this encounter.  No orders of the defined types were placed in this encounter.    Procedures: No procedures performed  Clinical Data: No additional findings.  ROS:  All other systems negative, except as noted in the HPI. Review of Systems  Objective: Vital Signs: There were no vitals taken for this visit.  Specialty Comments:  No specialty comments available.  PMFS History: Patient Active Problem List   Diagnosis Date Noted  . Drug therapy 05/04/2020  . 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 Osteopenia of multiple sites 02/28/2020  . PVD (peripheral vascular disease) (HCC) 10/11/2019  . Chronic venous insufficiency 10/05/2018  . Diabetic cataract (HCC) 05/21/2018  . Controlled  type 1 diabetes mellitus with diabetic peripheral angiopathy without gangrene (HCC) 08/14/2017  . Dyslipidemia 03/26/2017  . High risk medication use 09/23/2016  . History of hypertension 09/23/2016  . History of chronic kidney disease 09/23/2016  . History of coronary artery disease 09/23/2016  . Tobacco abuse 09/23/2016  . Primary osteoarthritis of both knees 09/23/2016  . History of diabetes mellitus 09/23/2016  . 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 (HCC) 11/20/2015  . Chest pain with high risk for cardiac etiology 10/29/2015  .  Asymptomatic bilateral carotid artery stenosis 06/08/2014  . Pleural effusion, bilateral 06/03/2014  . Dyspnea 06/01/2014  . Diabetes (HCC) 05/03/2014  . 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/03/2014  . 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 Chronic renal disease, stage 3, moderately decreased glomerular filtration rate between 30-59 mL/min/1.73 square meter (HCC) 05/03/2014  . CAD (coronary artery disease) 04/27/2014  . Acne 12/19/2013  . On isotretinoin therapy 12/19/2013  . Nuclear sclerotic cataract, bilateral 12/19/2011  . Vitreous hemorrhage (HCC) 06/16/2011   Past Medical History:  Diagnosis Date  . Anginal pain (HCC)   . Anxiety   . Arthritis    RA IN HANDS  . Asthma   . CAD (coronary artery disease) 04/27/2014  . Chronic renal disease, stage 3, moderately decreased glomerular filtration rate between 30-59 mL/min/1.73 square meter (HCC) 05/03/2014  . COPD (chronic obstructive pulmonary disease) (HCC)   . Coronary artery disease   . Diabetes (HCC) 05/03/2014  . Diabetes mellitus without complication (HCC)    type 1  . Hyperlipidemia   . Left shoulder pain   . 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/03/2014  . Shortness of breath   . Sleep apnea    mild OSA, no CPAP  . Unstable angina pectoris (HCC) 04/25/2014    Family History  Problem Relation Age of Onset  . Prostate cancer Father   . Heart disease Father   . Stomach cancer Mother   . Heart disease Brother   . Asthma Daughter   . Healthy Daughter     Past Surgical History:  Procedure Laterality Date  . CARDIAC CATHETERIZATION  04/25/2014   BY DR Jacinto Halim  . CARDIAC CATHETERIZATION N/A 10/30/2015   Procedure: Left Heart Cath and Cors/Grafts Angiography;  Surgeon: Yates Decamp, MD;  Location: [facility] INVASIVE CV LAB;  Service: Cardiovascular;  Laterality: N/A;  . CARPAL TUNNEL RELEASE    . CATARACT EXTRACTION, BILATERAL    . CORONARY ARTERY BYPASS GRAFT N/A 04/27/2014   Procedure: CORONARY ARTERY BYPASS  GRAFTING on pump using left internal mammary artery to LAD coronary artery, right great saphenous vein graft to diagonal coronary artery with sequential to OM1 and circumflex coronary arteries. Right greater saphenous vein graft to posterior descending coronary artery. ;  Surgeon: Delight Ovens, MD;  Location: [facility] OR;  Service: Open Heart Surgery;  Laterality: N  . elbow drained Left 05/09/2019  . ENDOVEIN HARVEST OF GREATER SAPHENOUS VEIN Right 04/27/2014   Procedure: ENDOVEIN HARVEST OF GREATER SAPHENOUS VEIN;  Surgeon: Delight Ovens, MD;  Location: [facility] OR;  Service: Open Heart Surgery;  Laterality: Right;  . EXCISION ORAL TUMOR N/A 03/01/2018   Procedure: EXCISION ORAL TUMOR;  Surgeon: Christia Reading, MD;  Location: [facility];  Service: ENT;  Laterality: N/A;  . EYE SURGERY     LASER  . EYE SURGERY Bilateral    astigmatism correction   . INTRAOPERATIVE  TRANSESOPHAGEAL ECHOCARDIOGRAM N/A 04/27/2014   Procedure: INTRAOPERATIVE TRANSESOPHAGEAL ECHOCARDIOGRAM;  Surgeon: Delight Ovens, MD;  Location: [facility] OR;  Service: Open Heart Surgery;  Laterality: N/A;  . LEFT HEART CATHETERIZATION WITH CORONARY ANGIOGRAM N/A 04/25/2014   Procedure: LEFT HEART CATHETERIZATION WITH CORONARY ANGIOGRAM;  Surgeon: Pamella Pert, MD;  Location: [facility] CATH LAB;  Service: Cardiovascular;  Laterality: N/A;  . MOUTH SURGERY  11/15/2017   tongue surgery   . ORIF TOE FRACTURE Left 03/22/2020   Procedure: OPEN REDUCTION INTERNAL FIXATION (ORIF) Non Union 5th Metatarsal;  Surgeon: Kathryne Hitch, MD;  Location: [facility];  Service: Orthopedics;  Laterality: Left;   Social History   Occupational History  . Not on file  Tobacco Use  . Smoking status: Current Every Day Smoker    Packs/day: 1.00    Years: 34.00    Pack years: 34.00    Types: Cigarettes  . Smokeless tobacco: Never Used  Vaping Use  . Vaping Use: Never used  Substance and Sexual Activity  . Alcohol use: Yes     Alcohol/week: 0.0 standard drinks    Comment: RARE  . Drug use: No  . Sexual activity: Not on file

## 2020-09-06 ENCOUNTER — Ambulatory Visit: Payer: 59 | Admitting: Orthopedic Surgery

## 2020-09-06 DIAGNOSIS — T847XXD Infection and inflammatory reaction due to other internal orthopedic prosthetic devices, implants and grafts, subsequent encounter: Secondary | ICD-10-CM

## 2020-09-06 DIAGNOSIS — S92352K Displaced fracture of fifth metatarsal bone, left foot, subsequent encounter for fracture with nonunion: Secondary | ICD-10-CM | POA: Diagnosis not present

## 2020-09-06 MED ORDER — DOXYCYCLINE HYCLATE 100 MG PO TABS
100.0000 mg | ORAL_TABLET | Freq: Two times a day (BID) | ORAL | 0 refills | Status: DC
Start: 2020-09-06 — End: 2020-09-20

## 2020-09-06 MED ORDER — LEVOFLOXACIN 750 MG PO TABS
750.0000 mg | ORAL_TABLET | Freq: Every day | ORAL | 0 refills | Status: DC
Start: 2020-09-06 — End: 2020-11-15

## 2020-09-10 ENCOUNTER — Ambulatory Visit: Payer: 59 | Admitting: Orthopedic Surgery

## 2020-09-10 ENCOUNTER — Encounter: Payer: Self-pay | Admitting: Orthopedic Surgery

## 2020-09-10 NOTE — Progress Notes (Signed)
Office Visit Note   Patient: [name]           Date of Birth: April 28, 1966           MRN: [id number] Visit Date: 09/06/2020              Requested by: [name], MD [address] [place],  [place] [address] PCP: [name], MD  Chief Complaint  Patient presents with  . 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  Patient states he developed some pain 2 nights ago.  He has been full weightbearing in regular shoe.  Assessment & Plan: Visit Diagnoses:  1. 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 Hardware complicating wound infection, subsequent encounter     Plan: Patient will resume his Levaquin and doxycycline reevaluate in 2 weeks.  Anticipate that we will need to remove the deep retained hardware and debride the wound obtain tissue for cultures for antibiotic coverage.  Follow-Up Instructions: Return in about 2 weeks (around 09/20/2020).   Ortho Exam  Patient is alert, oriented, no adenopathy, well-dressed, normal affect, normal respiratory effort. Examination there is no drainage patient has had acute pain there is an area of cellulitis approximately 1 cm in diameter along the incision.  The area of redness is not tender to palpation.  Patient states he would like to proceed with 1 additional round of oral antibiotics.  Imaging: No results found. No images are attached to the encounter.  Labs: Lab Results  Component Value Date   HGBA1C 7.4 (H) 04/25/2014   HGBA1C 7.4 (H) 04/25/2014   ESRSEDRATE 9  09/26/2019   ESRSEDRATE 12 09/09/2017   ESRSEDRATE 58 (H) 06/01/2014     Lab Results  Component Value Date   ALBUMIN 3.7 03/15/2018   ALBUMIN 3.7 09/09/2017   ALBUMIN 4.2 03/30/2017    Lab Results  Component Value Date   MG 2.3 04/28/2014   MG 2.4 04/28/2014   MG 2.7 (H) 04/27/2014   Lab Results  Component Value Date   VD25OH 57 06/22/2019    No results found for: PREALBUMIN CBC EXTENDED Latest Ref Rng & Units 12/19/2019 09/26/2019 06/22/2019  WBC 3.8 - 10.8 Thousand/uL 10.5 9.6 10.0  RBC 4.20 - 5.80 Million/uL 4.27 4.46 4.41  HGB 13.2 - 17.1 g/dL 13.1(L) 13.7 13.2  HCT 38.5 - 50.0 % 39.4 41.4 40.7  PLT 140 - 400 Thousand/uL 251 270 260  NEUTROABS 1,500 - 7,800 cells/uL 5,660 5,386 5,420  LYMPHSABS 850 - 3,900 cells/uL 3,507 2,957 3,390     There is no height or weight on file to calculate BMI.  Orders:  No orders of the defined types were placed in this encounter.  Meds ordered this encounter  Medications  . doxycycline (VIBRA-TABS) 100 MG tablet    Sig: Take 1 tablet (100 mg total) by mouth 2 (two) times daily.    Dispense:  60 tablet    Refill:  0  . levofloxacin (LEVAQUIN) 750 MG tablet    Sig: Take 1 tablet (750  mg total) by mouth daily.    Dispense:  30 tablet    Refill:  0     Procedures: No procedures performed  Clinical Data: No additional findings.  ROS:  All other systems negative, except as noted in the HPI. Review of Systems  Objective: Vital Signs: There were no vitals taken for this visit.  Specialty Comments:  No specialty comments available.  PMFS History: Patient Active Problem List   Diagnosis Date Noted  . Drug therapy 05/04/2020  . 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 Osteopenia of multiple sites 02/28/2020  . PVD (peripheral vascular disease) (HCC) 10/11/2019  . Chronic venous insufficiency 10/05/2018  . Diabetic cataract (HCC) 05/21/2018  . Controlled type 1 diabetes mellitus  with diabetic peripheral angiopathy without gangrene (HCC) 08/14/2017  . Dyslipidemia 03/26/2017  . High risk medication use 09/23/2016  . History of hypertension 09/23/2016  . History of chronic kidney disease 09/23/2016  . History of coronary artery disease 09/23/2016  . Tobacco abuse 09/23/2016  . Primary osteoarthritis of both knees 09/23/2016  . History of diabetes mellitus 09/23/2016  . 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 (HCC) 11/20/2015  . Chest pain with high risk for cardiac etiology 10/29/2015  . Asymptomatic bilateral carotid artery stenosis 06/08/2014  . Pleural effusion, bilateral 06/03/2014  . Dyspnea 06/01/2014  . Diabetes (HCC) 05/03/2014  . 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/03/2014  . 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 Chronic renal disease, stage 3, moderately decreased glomerular filtration rate between 30-59 mL/min/1.73 square meter (HCC) 05/03/2014  . CAD (coronary artery disease) 04/27/2014  . Acne 12/19/2013  . On isotretinoin therapy 12/19/2013  . Nuclear sclerotic cataract, bilateral 12/19/2011  . Vitreous hemorrhage (HCC) 06/16/2011   Past Medical History:  Diagnosis Date  . Anginal pain (HCC)   . Anxiety   . Arthritis    RA IN HANDS  . Asthma   . CAD (coronary artery disease) 04/27/2014  . Chronic renal disease, stage 3, moderately decreased glomerular filtration rate between 30-59 mL/min/1.73 square meter (HCC) 05/03/2014  . COPD (chronic obstructive pulmonary disease) (HCC)   . Coronary artery disease   . Diabetes (HCC) 05/03/2014  . Diabetes mellitus without complication (HCC)    type 1  . Hyperlipidemia   . Left shoulder pain   . 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/03/2014  . Shortness of breath   . Sleep apnea    mild OSA, no CPAP  . Unstable angina pectoris (HCC) 04/25/2014    Family History  Problem  Relation Age of Onset  . Prostate cancer Father   . Heart disease Father   . Stomach cancer Mother   . Heart disease Brother   . Asthma Daughter   . Healthy Daughter     Past Surgical History:  Procedure Laterality Date  . CARDIAC CATHETERIZATION  04/25/2014   BY DR Jacinto Halim  . CARDIAC CATHETERIZATION N/A 10/30/2015   Procedure: Left Heart Cath and Cors/Grafts Angiography;  Surgeon: Yates Decamp, MD;  Location: [facility] INVASIVE CV LAB;  Service: Cardiovascular;  Laterality: N/A;  . CARPAL TUNNEL RELEASE    . CATARACT EXTRACTION, BILATERAL    . CORONARY ARTERY BYPASS GRAFT N/A 04/27/2014   Procedure: CORONARY ARTERY BYPASS GRAFTING on pump using left internal mammary artery to LAD coronary artery, right great saphenous vein graft to diagonal coronary artery with sequential to OM1 and circumflex  coronary arteries. Right greater saphenous vein graft to posterior descending coronary artery. ;  Surgeon: Delight Ovens, MD;  Location: [facility];  Service: Open Heart Surgery;  Laterality: N  . elbow drained Left 05/09/2019  . ENDOVEIN HARVEST OF GREATER SAPHENOUS VEIN Right 04/27/2014   Procedure: ENDOVEIN HARVEST OF GREATER SAPHENOUS VEIN;  Surgeon: Delight Ovens, MD;  Location: [facility] OR;  Service: Open Heart Surgery;  Laterality: Right;  . EXCISION ORAL TUMOR N/A 03/01/2018   Procedure: EXCISION ORAL TUMOR;  Surgeon: Christia Reading, MD;  Location: [facility];  Service: ENT;  Laterality: N/A;  . EYE SURGERY     LASER  . EYE SURGERY Bilateral    astigmatism correction   . INTRAOPERATIVE TRANSESOPHAGEAL ECHOCARDIOGRAM N/A 04/27/2014   Procedure: INTRAOPERATIVE TRANSESOPHAGEAL ECHOCARDIOGRAM;  Surgeon: Delight Ovens, MD;  Location: [facility] OR;  Service: Open Heart Surgery;  Laterality: N/A;  . LEFT HEART CATHETERIZATION WITH CORONARY ANGIOGRAM N/A 04/25/2014   Procedure: LEFT HEART CATHETERIZATION WITH CORONARY ANGIOGRAM;  Surgeon: Pamella Pert, MD;  Location: [facility] CATH LAB;  Service:  Cardiovascular;  Laterality: N/A;  . MOUTH SURGERY  11/15/2017   tongue surgery   . ORIF TOE FRACTURE Left 03/22/2020   Procedure: OPEN REDUCTION INTERNAL FIXATION (ORIF) Non Union 5th Metatarsal;  Surgeon: Kathryne Hitch, MD;  Location: [facility];  Service: Orthopedics;  Laterality: Left;   Social History   Occupational History  . Not on file  Tobacco Use  . Smoking status: Current Every Day Smoker    Packs/day: 1.00    Years: 34.00    Pack years: 34.00    Types: Cigarettes  . Smokeless tobacco: Never Used  Vaping Use  . Vaping Use: Never used  Substance and Sexual Activity  . Alcohol use: Yes    Alcohol/week: 0.0 standard drinks    Comment: RARE  . Drug use: No  . Sexual activity: Not on file

## 2020-09-20 ENCOUNTER — Other Ambulatory Visit: Payer: Self-pay

## 2020-09-20 ENCOUNTER — Encounter: Payer: Self-pay | Admitting: Orthopedic Surgery

## 2020-09-20 ENCOUNTER — Ambulatory Visit: Payer: 59 | Admitting: Student

## 2020-09-20 ENCOUNTER — Ambulatory Visit: Payer: 59 | Admitting: Orthopedic Surgery

## 2020-09-20 ENCOUNTER — Encounter: Payer: Self-pay | Admitting: Student

## 2020-09-20 VITALS — BP 133/68 | HR 64 | Temp 98.0°F | Resp 16 | Ht 68.0 in | Wt 200.0 lb

## 2020-09-20 DIAGNOSIS — E7800 Pure hypercholesterolemia, unspecified: Secondary | ICD-10-CM

## 2020-09-20 DIAGNOSIS — I2510 Atherosclerotic heart disease of native coronary artery without angina pectoris: Secondary | ICD-10-CM

## 2020-09-20 DIAGNOSIS — T847XXD Infection and inflammatory reaction due to other internal orthopedic prosthetic devices, implants and grafts, subsequent encounter: Secondary | ICD-10-CM

## 2020-09-20 DIAGNOSIS — I6523 Occlusion and stenosis of bilateral carotid arteries: Secondary | ICD-10-CM

## 2020-09-20 DIAGNOSIS — I10 Essential (primary) hypertension: Secondary | ICD-10-CM

## 2020-09-20 DIAGNOSIS — R072 Precordial pain: Secondary | ICD-10-CM

## 2020-09-20 NOTE — Progress Notes (Signed)
EKG 09/20/2020: Normal sinus rhythm at rate of 62 bpm, left atrial enlargement, normal axis.  Nonspecific T abnormality.

## 2020-09-20 NOTE — Progress Notes (Signed)
Office Visit Note   Patient: [name]           Date of Birth: [birth year]           MRN: [id number] Visit Date: 09/20/2020              Requested by: [name], MD [address] [place],  [place] [address] PCP: [name], MD  Chief Complaint  Patient presents with  . Left Leg - Fracture      HPI: Patient is a 55 year old gentleman he has undergone revision internal fixation for Jones fracture left foot.  Patient states he still has redness along the incision with 3 small wounds that occasionally have opened.  He has been on Levaquin and doxycycline without change in the cellulitis along the incision.  Of note patient states he is periodically having some chest pain on the right.  Patient is status post CABG surgery and his cardiologist is Dr. Jacinto Halim primary care physician Dr. Jarold Motto  Assessment & Plan: Visit Diagnoses:  1. Hardware complicating wound infection, subsequent encounter     Plan: Recommend the patient follow-up with Dr. Jacinto Halim for evaluation of the right chest pain.  Due to the persistent infection status post internal fixation for the Jones fracture will plan for surgical intervention on a Wednesday for removal of the hardware or scraping the bone placing vancomycin powder and when then adjust antibiotics based on tissue cultures.  Risk and benefits were discussed patient states he understands wished to proceed at this time.  Follow-Up Instructions: Return if symptoms worsen or fail to improve.   Ortho Exam  Patient is alert, oriented, no adenopathy, well-dressed, normal affect, normal respiratory effort. Examination there is persistent cellulitis along the surgical incision he does have venous skin color changes there is no ascending cellulitis there is no drainage there is no fluctuance.  Imaging: No results found. No images are attached to the encounter.  Labs: Lab Results  Component Value Date   HGBA1C 7.4 (H) 04/25/2014   HGBA1C  7.4 (H) 04/25/2014   ESRSEDRATE 9 09/26/2019   ESRSEDRATE 12 09/09/2017   ESRSEDRATE 58 (H) 06/01/2014     Lab Results  Component Value Date   ALBUMIN 3.7 03/15/2018   ALBUMIN 3.7 09/09/2017   ALBUMIN 4.2 03/30/2017    Lab Results  Component Value Date   MG 2.3 04/28/2014   MG 2.4 04/28/2014   MG 2.7 (H) 04/27/2014   Lab Results  Component Value Date   VD25OH 57 06/22/2019    No results found for: PREALBUMIN CBC EXTENDED Latest Ref Rng & Units 12/19/2019 09/26/2019 06/22/2019  WBC 3.8 - 10.8 Thousand/uL 10.5 9.6 10.0  RBC 4.20 - 5.80 Million/uL 4.27 4.46 4.41  HGB 13.2 - 17.1 g/dL 13.1(L) 13.7 13.2  HCT 38.5 - 50.0 % 39.4 41.4 40.7  PLT 140 - 400 Thousand/uL 251 270 260  NEUTROABS 1,500 - 7,800 cells/uL 5,660 5,386 5,420  LYMPHSABS 850 - 3,900 cells/uL 3,507 2,957 3,390     There is no height or weight on file to calculate BMI.  Orders:  No orders of the defined types were placed in this encounter.  No orders of the defined types were placed in this encounter.    Procedures: No procedures performed  Clinical Data: No additional findings.  ROS:  All other systems negative, except as noted in the HPI. Review of Systems  Objective: Vital Signs: There were no vitals taken for this visit.  Specialty Comments:  No specialty  comments available.  PMFS History: Patient Active Problem List   Diagnosis Date Noted  . Drug therapy 05/04/2020  . 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 Osteopenia of multiple sites 02/28/2020  . PVD (peripheral vascular disease) (HCC) 10/11/2019  . Chronic venous insufficiency 10/05/2018  . Diabetic cataract (HCC) 05/21/2018  . Controlled type 1 diabetes mellitus with diabetic peripheral angiopathy without gangrene (HCC) 08/14/2017  . Dyslipidemia 03/26/2017  . High risk medication use 09/23/2016  . History of hypertension 09/23/2016  . History of chronic kidney disease 09/23/2016  .  History of coronary artery disease 09/23/2016  . Tobacco abuse 09/23/2016  . Primary osteoarthritis of both knees 09/23/2016  . History of diabetes mellitus 09/23/2016  . 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 (HCC) 11/20/2015  . Chest pain with high risk for cardiac etiology 10/29/2015  . Asymptomatic bilateral carotid artery stenosis 06/08/2014  . Pleural effusion, bilateral 06/03/2014  . Dyspnea 06/01/2014  . Diabetes (HCC) 05/03/2014  . 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/03/2014  . 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 Chronic renal disease, stage 3, moderately decreased glomerular filtration rate between 30-59 mL/min/1.73 square meter (HCC) 05/03/2014  . CAD (coronary artery disease) 04/27/2014  . Acne 12/19/2013  . On isotretinoin therapy 12/19/2013  . Nuclear sclerotic cataract, bilateral 12/19/2011  . Vitreous hemorrhage (HCC) 06/16/2011   Past Medical History:  Diagnosis Date  . Anginal pain (HCC)   . Anxiety   . Arthritis    RA IN HANDS  . Asthma   . CAD (coronary artery disease) 04/27/2014  . Chronic renal disease, stage 3, moderately decreased glomerular filtration rate between 30-59 mL/min/1.73 square meter (HCC) 05/03/2014  . COPD (chronic obstructive pulmonary disease) (HCC)   . Coronary artery disease   . Diabetes (HCC) 05/03/2014  . Diabetes mellitus without complication (HCC)    type 1  . Hyperlipidemia   . Left shoulder pain   . 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/03/2014  . Shortness of breath   . Sleep apnea    mild OSA, no CPAP  . Unstable angina pectoris (HCC) 04/25/2014    Family History  Problem Relation Age of Onset  . Prostate cancer Father   . Heart disease Father   . Stomach cancer Mother   . Heart disease Brother   . Asthma Daughter   . Healthy Daughter     Past Surgical History:  Procedure Laterality Date  .  CARDIAC CATHETERIZATION  04/25/2014   BY DR Jacinto Halim  . CARDIAC CATHETERIZATION N/A 10/30/2015   Procedure: Left Heart Cath and Cors/Grafts Angiography;  Surgeon: Yates Decamp, MD;  Location: [facility] INVASIVE CV LAB;  Service: Cardiovascular;  Laterality: N/A;  . CARPAL TUNNEL RELEASE    . CATARACT EXTRACTION, BILATERAL    . CORONARY ARTERY BYPASS GRAFT N/A 04/27/2014   Procedure: CORONARY ARTERY BYPASS GRAFTING on pump using left internal mammary artery to LAD coronary artery, right great saphenous vein graft to diagonal coronary artery with sequential to OM1 and circumflex coronary arteries. Right greater saphenous vein graft to posterior descending coronary artery. ;  Surgeon: Delight Ovens, MD;  Location: [facility] OR;  Service: Open Heart Surgery;  Laterality: N  . elbow drained Left 05/09/2019  . ENDOVEIN HARVEST OF GREATER SAPHENOUS VEIN Right 04/27/2014   Procedure: ENDOVEIN HARVEST OF GREATER SAPHENOUS VEIN;  Surgeon: Delight Ovens, MD;  Location: [facility] OR;  Service: Open Heart Surgery;  Laterality: Right;  . EXCISION ORAL TUMOR N/A 03/01/2018   Procedure: EXCISION ORAL TUMOR;  Surgeon: Christia Reading, MD;  Location: [facility];  Service: ENT;  Laterality: N/A;  . EYE SURGERY     LASER  . EYE SURGERY Bilateral    astigmatism correction   . INTRAOPERATIVE TRANSESOPHAGEAL ECHOCARDIOGRAM N/A 04/27/2014   Procedure: INTRAOPERATIVE TRANSESOPHAGEAL ECHOCARDIOGRAM;  Surgeon: Delight Ovens, MD;  Location: [facility] OR;  Service: Open Heart Surgery;  Laterality: N/A;  . LEFT HEART CATHETERIZATION WITH CORONARY ANGIOGRAM N/A 04/25/2014   Procedure: LEFT HEART CATHETERIZATION WITH CORONARY ANGIOGRAM;  Surgeon: Pamella Pert, MD;  Location: [facility] CATH LAB;  Service: Cardiovascular;  Laterality: N/A;  . MOUTH SURGERY  11/15/2017   tongue surgery   . ORIF TOE FRACTURE Left 03/22/2020   Procedure: OPEN REDUCTION INTERNAL FIXATION (ORIF) Non Union 5th Metatarsal;  Surgeon: Kathryne Hitch, MD;   Location: [facility];  Service: Orthopedics;  Laterality: Left;   Social History   Occupational History  . Not on file  Tobacco Use  . Smoking status: Current Every Day Smoker    Packs/day: 1.00    Years: 34.00    Pack years: 34.00    Types: Cigarettes  . Smokeless tobacco: Never Used  Vaping Use  . Vaping Use: Never used  Substance and Sexual Activity  . Alcohol use: Yes    Alcohol/week: 0.0 standard drinks    Comment: RARE  . Drug use: No  . Sexual activity: Not on file

## 2020-09-20 NOTE — Progress Notes (Signed)
Primary Physician:  Leanna Battles, MD  Patient ID: [name], male    DOB: January 18, 1966, 55 y.o.   MRN: [id number]  Subjective:   Chief Complaint  Patient presents with  . Chest Pain    Right side  . Pre-op Exam    Referred by Dr. Carrolyn Leigh   HPI: [name]  is a 55 y.o. male  with history of Type I Diabetes, rheumatoid arthritis, hyperlipidemia, and CAD S/P coronary artery bypass grafting on 04/27/2014.    He had sustained left foot fracture and also developed left elbow bursitis which later led to infection in Oct 2020. Bursitis has now resolved.  Patient has had persistent infection in his left foot despite multiple rounds of antibiotics.  It has been recommended by Dr. Sharol Given with orthopedics that patient undergo removal of hardware in his left foot.  Patient now presents to our office for evaluation of chest pain and preoperative risk stratification.  Patient reports surgery will not be done under general anesthesia.  Patient states over the last 3-4 weeks he has been experiencing intermittent brief episodes of right sided chest pain, which he describes as sharp.  This discomfort does not occur with exertion but only at rest lasting less than 1 minute.  Patient states he walks up and down the stairs several times a day while at work without issue.  Patient's pain is localized to the fourth and fifth intercostal space in the midclavicular line on the right side of his chest.  Patient is also reported occasional left-sided neck pain over the last 2 weeks worse when turning his head.  Patient does continue to have chronic stable dyspnea on exertion, unfortunately he continues to smoke 5 cigarettes/day.  His last A1c has improved and was 5.3% approximately 2 months ago.  Past Medical History:  Diagnosis Date  . Anginal pain ([address])   . Anxiety   . Arthritis    RA IN HANDS  . Asthma   . CAD (coronary artery disease) 04/27/2014  . Chronic renal disease, stage 3, moderately decreased  glomerular filtration rate between 30-59 mL/min/1.73 square meter (HCC) 05/03/2014  . COPD (chronic obstructive pulmonary disease) ([place])   . Coronary artery disease   . Diabetes ([place]) 05/03/2014  . Diabetes mellitus without complication ([place])    type 1  . Hyperlipidemia   . Left shoulder pain   . Rheumatoid arthritis involving multiple joints ([place]) 05/03/2014  . Shortness of breath   . Sleep apnea    mild OSA, no CPAP  . Unstable angina pectoris ([place]) 04/25/2014    Past Surgical History:  Procedure Laterality Date  . CARDIAC CATHETERIZATION  04/25/2014   BY DR Einar Gip  . CARDIAC CATHETERIZATION N/A 10/30/2015   Procedure: Left Heart Cath and Cors/Grafts Angiography;  Surgeon: Adrian Prows, MD;  Location: [facility];  Service: Cardiovascular;  Laterality: N/A;  . CARPAL TUNNEL RELEASE    . CATARACT EXTRACTION, BILATERAL    . CORONARY ARTERY BYPASS GRAFT N/A 04/27/2014   Procedure: CORONARY ARTERY BYPASS GRAFTING on pump using left internal mammary artery to LAD coronary artery, right great saphenous vein graft to diagonal coronary artery with sequential to OM1 and circumflex coronary arteries. Right greater saphenous vein graft to posterior descending coronary artery. ;  Surgeon: Grace Isaac, MD;  Location: [facility];  Service: Open Heart Surgery;  Laterality: N  . elbow drained Left 05/09/2019  . ENDOVEIN HARVEST OF GREATER SAPHENOUS VEIN Right 04/27/2014   Procedure: ENDOVEIN  HARVEST OF GREATER SAPHENOUS VEIN;  Surgeon: Edward B Gerhardt, MD;  Location: [facility];  Service: Open Heart Surgery;  Laterality: Right;  . EXCISION ORAL TUMOR N/A 03/01/2018   Procedure: EXCISION ORAL TUMOR;  Surgeon: Bates, Dwight, MD;  Location: [facility];  Service: ENT;  Laterality: N/A;  . EYE SURGERY     LASER  . EYE SURGERY Bilateral    astigmatism correction   . INTRAOPERATIVE TRANSESOPHAGEAL ECHOCARDIOGRAM N/A 04/27/2014   Procedure: INTRAOPERATIVE TRANSESOPHAGEAL ECHOCARDIOGRAM;  Surgeon:  Edward B Gerhardt, MD;  Location: [facility];  Service: Open Heart Surgery;  Laterality: N/A;  . LEFT HEART CATHETERIZATION WITH CORONARY ANGIOGRAM N/A 04/25/2014   Procedure: LEFT HEART CATHETERIZATION WITH CORONARY ANGIOGRAM;  Surgeon: Jagadeesh R Ganji, MD;  Location: [facility];  Service: Cardiovascular;  Laterality: N/A;  . MOUTH SURGERY  11/15/2017   tongue surgery   . ORIF TOE FRACTURE Left 03/22/2020   Procedure: OPEN REDUCTION INTERNAL FIXATION (ORIF) Non [place] 5th Metatarsal;  Surgeon: Blackman, Christopher Y, MD;  Location: [facility];  Service: Orthopedics;  Laterality: Left;   Social History   Tobacco Use  . Smoking status: Current Every Day Smoker    Packs/day: 0.25    Years: 34.00    Pack years: 8.50    Types: Cigarettes  . Smokeless tobacco: Never Used  Substance Use Topics  . Alcohol use: Yes    Alcohol/week: 0.0 standard drinks    Comment: RARE    Marital status: Divorced   Review of Systems  Constitutional: Negative for malaise/fatigue and weight gain.  Cardiovascular: Positive for chest pain and dyspnea on exertion (stable). Negative for claudication, leg swelling, near-syncope, orthopnea, palpitations, paroxysmal nocturnal dyspnea and syncope.  Respiratory: Negative for shortness of breath.   Hematologic/Lymphatic: Does not bruise/bleed easily.  Musculoskeletal: Positive for arthritis, back pain and neck pain.  Gastrointestinal: Negative for melena.  Neurological: Negative for dizziness and weakness.   Objective:  Blood pressure 133/68, pulse 64, temperature 98 F (36.7 C), temperature source Temporal, resp. rate 16, height 5' 8" (1.727 m), weight 200 lb (90.7 kg), SpO2 97 %. Body mass index is 30.41 kg/m.   Vitals with BMI 09/20/2020 06/25/2020 05/01/2020  Height 5' 8" 5' 8" 5' 8"  Weight 200 lbs 195 lbs 195 lbs  BMI 30.42 29.66 29.66  Systolic 133 - -  Diastolic 68 - -  Pulse 64 - -    Physical Exam Vitals reviewed.  Constitutional:       Appearance: He is well-developed.  HENT:     Head: Normocephalic and atraumatic.  Cardiovascular:     Rate and Rhythm: Normal rate and regular rhythm.     Pulses: Intact distal pulses.          Carotid pulses are on the right side with bruit and on the left side with bruit.      Femoral pulses are 2+ on the right side and 2+ on the left side.      Popliteal pulses are 2+ on the right side and 2+ on the left side.       Dorsalis pedis pulses are 1+ on the right side and 2+ on the left side.       Posterior tibial pulses are 1+ on the right side and 1+ on the left side.     Heart sounds: Normal heart sounds, S1 normal and S2 normal. No murmur heard. No gallop.   Pulmonary:     Effort: Pulmonary effort   is normal. No accessory muscle usage or respiratory distress.     Breath sounds: Normal breath sounds. No wheezing, rhonchi or rales.  Chest:     Chest wall: Tenderness present.    Abdominal:     General: Bowel sounds are normal.     Palpations: Abdomen is soft.  Musculoskeletal:     Right lower leg: Edema (trace) present.     Left lower leg: Edema (trace) present.  Skin:    Comments: Hyperpigmentation of the left foot Erythema and nearly healed wound lateral side of left foot  Neurological:     Mental Status: He is alert.    Radiology: No results found.  Laboratory examination:    CMP Latest Ref Rng & Units 03/19/2020 12/19/2019 12/19/2019  Glucose 70 - 99 mg/dL 228(H) 89 -  BUN 6 - 20 mg/dL 10 14 -  Creatinine 0.61 - 1.24 mg/dL 1.38(H) 1.54(H) -  Sodium 135 - 145 mmol/L 139 138 -  Potassium 3.5 - 5.1 mmol/L 4.9 4.6 -  Chloride 98 - 111 mmol/L 105 105 -  CO2 22 - 32 mmol/L 26 25 -  Calcium 8.9 - 10.3 mg/dL 8.9 9.0 -  Total Protein 6.1 - 8.1 g/dL - 6.5 6.5  Total Bilirubin 0.2 - 1.2 mg/dL - 0.3 -  Alkaline Phos 38 - 126 U/L - - -  AST 10 - 35 U/L - 37(H) -  ALT 9 - 46 U/L - 40 -   CBC Latest Ref Rng & Units 12/19/2019 09/26/2019 06/22/2019  WBC 3.8 - 10.8 Thousand/uL 10.5  9.6 10.0  Hemoglobin 13.2 - 17.1 g/dL 13.1(L) 13.7 13.2  Hematocrit 38.5 - 50.0 % 39.4 41.4 40.7  Platelets 140 - 400 Thousand/uL 251 270 260   Lipid Panel     Component Value Date/Time   CHOL 55 (L) 02/10/2019 0907   TRIG 43 02/10/2019 0907   HDL 29 (L) 02/10/2019 0907   CHOLHDL 1.9 02/10/2019 0907   LDLCALC 17 02/10/2019 0907   HEMOGLOBIN A1C Lab Results  Component Value Date   HGBA1C 7.4 (H) 04/25/2014   MPG 166 (H) 04/25/2014   TSH No results for input(s): TSH in the last 8760 hours.  PRN Meds:. Medications Discontinued During This Encounter  Medication Reason  . ANORO ELLIPTA 62.5-25 MCG/INH AEPB Error  . bisoprolol (ZEBETA) 5 MG tablet Patient Preference  . bisoprolol (ZEBETA) 5 MG tablet Error  . LINZESS 145 MCG CAPS capsule Error  . ondansetron (ZOFRAN ODT) 4 MG disintegrating tablet Error  . oxyCODONE-acetaminophen (PERCOCET) 5-325 MG tablet Patient Preference  . sildenafil (REVATIO) 20 MG tablet Patient Preference  . tadalafil (CIALIS) 20 MG tablet Patient Preference  . Tocilizumab (ACTEMRA ACTPEN) 162 MG/0.9ML SOAJ Error  . doxycycline (VIBRA-TABS) 100 MG tablet Error  . FLORASTOR 250 MG capsule Error   Current Meds  Medication Sig  . albuterol (VENTOLIN HFA) 108 (90 Base) MCG/ACT inhaler Inhale 2 puffs into the lungs every 6 (six) hours as needed for wheezing.   . Ascorbic Acid (VITAMIN C) 1000 MG tablet Take 1,000 mg by mouth daily.  Marland Kitchen aspirin 81 MG tablet Take 81 mg by mouth daily.  . Blood Glucose Monitoring Suppl (ONETOUCH VERIO FLEX SYSTEM) w/Device KIT USE TO CHECK BLOOD GLUCOSE UP TO 10 TIMES PER DAY  . buPROPion (WELLBUTRIN SR) 150 MG 12 hr tablet Take 1 tablet (150 mg total) by mouth daily.  . Cholecalciferol (VITAMIN D3) 125 MCG (5000 UT) TABS Take 5,000 mg by mouth daily.   Marland Kitchen  Continuous Blood Gluc Sensor (FREESTYLE LIBRE 14 DAY SENSOR) MISC CHANGE EVERY 14 DAYS TO MONITOR BLOOD GLUCOSE  . Continuous Blood Gluc Sensor (FREESTYLE LIBRE 14 DAY  SENSOR) MISC CHANGE EVERY 14 DAYS TO MONITOR BLOOD GLUOSE  . Cyanocobalamin (VITAMIN B-12) 500 MCG LOZG Take 500 mg by mouth daily.   . doxycycline (VIBRA-TABS) 100 MG tablet Take 1 tablet (100 mg total) by mouth 2 (two) times daily.  . gabapentin (NEURONTIN) 300 MG capsule Take 1 capsule (300 mg total) by mouth at bedtime.  . glucose blood (ACCU-CHEK AVIVA PLUS) test strip   . HUMALOG 100 UNIT/ML injection Inject 10-20 Units into the skin 4 (four) times daily. Sliding scale  Depending on carb intake  . insulin glargine (LANTUS) 100 UNIT/ML injection Inject 14-20 Units into the skin at bedtime. Take 20 units in the morning and 14 unit at bedtime  . levofloxacin (LEVAQUIN) 750 MG tablet Take 1 tablet (750 mg total) by mouth daily.  . magnesium gluconate (MAGONATE) 500 MG tablet Take 500 mg by mouth daily.   . nitroGLYCERIN (NITROSTAT) 0.4 MG SL tablet DISSOLVE 1 TABLET UNDER THE TONGUE EVERY 5 MINUTES AS  NEEDED FOR CHEST PAIN. MAX  OF 3 TABLETS IN 15 MINUTES. CALL 911 IF PAIN PERSISTS. (Patient taking differently: Place 0.4 mg under the tongue every 5 (five) minutes as needed for chest pain.)  . oxymetazoline (AFRIN) 0.05 % nasal spray Place 2 sprays into both nostrils at bedtime.    Cardiac Studies:   CABG 04/27/2014: LIMA to LAD, SVG to D1, SVG to OM1 and distal circumflex, SVG to PDA. Dr. Ed Gerhardt.  Echocardiogram 06/08/2014: - Left ventricle cavity is normal in size. Moderate concentric hypertrophy of the left ventricle. Normal global wall motion. Visual EF is 60-65%. Calculated EF 56%. - Trileaflet aortic valve with no regurgitation noted. Mild calcification of the aortic valve annulus. - Mild mitral regurgitation. Mild calcification of the mitral valve annulus. - Mild tricuspid regurgitation. No evidence of pulmonary hypertension.  Coronary angiogram 10/30/2015: Normal LVEF, 55%. Mid LAD occluded after D1. LIMA to LAD, SVG to D1 patent. Circumflex occluded in the midsegment after OM1.  SVG to OM2 patent, sequential branch to his to circumflex occluded. Distal circumflex filled by collaterals. Mid RCA 90%, distal RCA 90%. SVG to RCA patent.  Lexiscan sestamibi stress test 10/12/2015: 1. The resting electrocardiogram demonstrated normal sinus rhythm, normal resting conduction, no resting arrhythmias and T changes. Stress EKG is non-diagnostic for ischemia as it a pharmacologic stress using Lexiscan. Stress test converted to Lexiscan due to exerise intolerence and dyspnea. 2. The perfusion imaging study demonstrates a medium size severe ischemia in the inferior and inferolateral wall extending from the base towards the apex. Left ventricular systolic function was normal with ejection fraction of 69%. This is a high risk study, consider further cardiac work-up.  Carotid artery duplex 07/07/2018: Stenosis in the bilateral internal carotid artery (16-49%). with heteregenous plaque. Antegrade vertebral artery flow. No significant change from 07/16/2017. Follow up in one year is appropriate if clinically indicated.  Lexiscan Tetrofosmin Stress Test  07/20/2019: Nondiagnostic ECG stress. There is a small sized mild reversible mild defect in the lateral region.  Compared to the study done on 10/12/2015, large inferolateral ischemia not present. Defect is consistent with known occluded distal circumflex coronary artery. Stress LV EF: 62%.  Intermediate risk study.   ABI 10/11/2019: Right: Resting right ankle-brachial index indicates noncompressible right  lower extremity arteries. The right toe-brachial index is abnormal. RT    great toe pressure = 83 mmHg.  Left: Resting left ankle-brachial index is within normal range. The left  toe-brachial index is abnormal. LT Great toe pressure = 70 mmHg.   EKG: 09/20/2020: Normal sinus rhythm at rate of 62 bpm, left atrial enlargement, normal axis.  Nonspecific T abnormality. No significant change from previous on 06/15/2019   Assessment:      ICD-10-CM   1. Precordial pain  R07.2 EKG 12-Lead    PCV ECHOCARDIOGRAM COMPLETE    PCV MYOCARDIAL PERFUSION WITH LEXISCAN  2. Coronary artery disease involving native coronary artery of native heart without angina pectoris  I25.10   3. Pure hypercholesterolemia  E78.00   4. Asymptomatic bilateral carotid artery stenosis  I65.23   5. Primary hypertension  I10    Recommendations:   [name]  is a 54 y.o. with history of Type I Diabetes, rheumatoid arthritis, hyperlipidemia, and CAD S/P coronary artery bypass grafting on 04/27/2014.   Patient presents for evaluation of precordial pain and preoperative risk stratification.  Patient symptoms are consistent with musculoskeletal etiology of chest pain.  Although patient is at elevated risk with history of diabetes and significant coronary artery disease, cardiovascular risk is not prohibitive of proceeding with scheduled left foot surgery. Chest pain ymptoms are suggestive of musculoskeletal ideology, there are no EKG changes suggestive of ischemia, patient has relatively recent stress test just over a year ago which was relatively low risk, and the surgery itself is a low risk procedure.   Patient understands that compared to other individuals his same age and sex without medical comorbidities he is at relatively higher risk for adverse cardiovascular events undergoing surgery.  However this risk is acceptable, and not prohibitive of proceeding with surgical intervention at this time.   In regard to recent neck pain, symptoms are also highly suggestive of musculoskeletal etiology.  Patient is notably due for carotid artery surveillance, therefore we will schedule for carotid artery duplex.  We will also obtain echocardiogram as his last echo available for review was done in 2015 and did show mild valvular abnormalities.  Do not feel it is necessary to wait for results of echocardiogram and carotid artery duplex in order to proceed with  surgery.  Follow up in 8 weeks, sooner if needed, for results of cardiac testing.   Patient was seen in collaboration with Dr. Ganji. He also reviewed patient's chart and Dr. Ganji is in agreement of the plan.    [name] C Cantwell, PA-C 09/20/2020, 7:20 PM Office: [phone] 

## 2020-09-21 ENCOUNTER — Telehealth: Payer: Self-pay

## 2020-09-21 NOTE — Telephone Encounter (Signed)
Patient called he stated his cardiologist told him he is cleared and can proceed with surgery he also stated his cardiologist sent over clarence for patient to have surgery call back:[phone]

## 2020-09-24 ENCOUNTER — Other Ambulatory Visit: Payer: 59

## 2020-09-25 ENCOUNTER — Other Ambulatory Visit: Payer: Self-pay

## 2020-09-25 ENCOUNTER — Ambulatory Visit: Payer: 59

## 2020-09-25 DIAGNOSIS — R072 Precordial pain: Secondary | ICD-10-CM

## 2020-09-28 ENCOUNTER — Other Ambulatory Visit: Payer: Self-pay

## 2020-10-01 ENCOUNTER — Other Ambulatory Visit: Payer: Self-pay | Admitting: Physician Assistant

## 2020-10-01 ENCOUNTER — Other Ambulatory Visit (HOSPITAL_COMMUNITY)
Admission: RE | Admit: 2020-10-01 | Discharge: 2020-10-01 | Disposition: A | Discharge status: Home / Self Care | Payer: 59 | Source: Ambulatory Visit | Attending: Orthopedic Surgery | Admitting: Orthopedic Surgery

## 2020-10-01 DIAGNOSIS — Z01812 Encounter for preprocedural laboratory examination: Secondary | ICD-10-CM | POA: Diagnosis present

## 2020-10-01 DIAGNOSIS — Z20822 Contact with and (suspected) exposure to covid-19: Secondary | ICD-10-CM | POA: Insufficient documentation

## 2020-10-01 LAB — SARS CORONAVIRUS 2 (TAT 6-24 HRS): SARS Coronavirus 2: NEGATIVE

## 2020-10-01 NOTE — Progress Notes (Signed)
Please inform patient his echo is stable, unchanged from previous. Will discuss further at next office visit.

## 2020-10-01 NOTE — Progress Notes (Signed)
Patient is aware 

## 2020-10-02 ENCOUNTER — Telehealth: Payer: Self-pay | Admitting: Orthopedic Surgery

## 2020-10-02 ENCOUNTER — Encounter (HOSPITAL_COMMUNITY): Payer: Self-pay | Admitting: Orthopedic Surgery

## 2020-10-02 ENCOUNTER — Telehealth: Payer: Self-pay

## 2020-10-02 NOTE — Telephone Encounter (Signed)
Pt called to let MD know he is having L foot pin removal tomorrow. MD is aware and pt will call us if he has any questions/concerns that arise.

## 2020-10-02 NOTE — Anesthesia Preprocedure Evaluation (Addendum)
Anesthesia Evaluation  Patient identified by MRN, date of birth, ID band Patient awake    Reviewed: Allergy & Precautions, NPO status , Patient's Chart, lab work & pertinent test results, reviewed documented beta blocker date and time   Airway Mallampati: III  TM Distance: >3 FB Neck ROM: Full    Dental  (+) Teeth Intact, Dental Advisory Given,    Pulmonary shortness of breath and with exertion, asthma , neg sleep apnea (no CPAP, borderline OSA on sleep study 2017 per pt), COPD,  COPD inhaler, Current Smoker,  20 pack years, still smoking 6-7cigg/d   Pulmonary exam normal breath sounds clear to auscultation       Cardiovascular hypertension, Pt. on home beta blockers + angina + CAD, + CABG (2015 CABG x 5) and + Peripheral Vascular Disease  Normal cardiovascular exam Rhythm:Regular Rate:Normal   symptoms of angina pectoris could be due to occluded distal circumflex coronary artery. There is no percutaneous options available as the native circumflex has a long segment CTO. This is best treated medically with aggressive risk factor modification and consider Ranexa for angina pectoris.  No chest pain recently   Neuro/Psych PSYCHIATRIC DISORDERS Anxiety negative neurological ROS     GI/Hepatic negative GI ROS, Neg liver ROS,   Endo/Other  diabetes, Well Controlled, Type 2, Insulin DependentTook 14units (instead of 17units) of lantus this morning, 11units lantus last night  FS in preop 53 Last a1c 5.3  Renal/GU   negative genitourinary   Musculoskeletal negative musculoskeletal ROS (+) Arthritis , Rheumatoid disorders,  Infected L foot hardware   Abdominal   Peds  Hematology negative hematology ROS (+)   Anesthesia Other Findings   Reproductive/Obstetrics negative OB ROS                            Anesthesia Physical Anesthesia Plan  ASA: III  Anesthesia Plan: General   Post-op Pain  Management:    Induction: Intravenous  PONV Risk Score and Plan: 1 and Ondansetron, Dexamethasone, Treatment may vary due to age or medical condition and Midazolam  Airway Management Planned: LMA  Additional Equipment: None  Intra-op Plan:   Post-operative Plan: Extubation in OR  Informed Consent: I have reviewed the patients History and Physical, chart, labs and discussed the procedure including the risks, benefits and alternatives for the proposed anesthesia with the patient or authorized representative who has indicated his/her understanding and acceptance.     Dental advisory given  Plan Discussed with: CRNA  Anesthesia Plan Comments:        Anesthesia Quick Evaluation

## 2020-10-02 NOTE — Progress Notes (Signed)
Spoke with pt for pre-op call. Pt has hx of CABG x 5. Dr. Jacinto Halim is his cardiologist. Denies any recent chest pain or shortness of breath. Pt is type 1 diabetic. Last A1C was 5.3 (3 weeks ago). He has a continuous blood sugar monitor. Pt instructed to take 80% of his Lantus insulin this evening and in the AM. He will take 11 units tonight and 13 units in the AM. Pt to check his blood sugar in the AM when he gets up. If blood sugar is >220 take 1/2 of usual correction dose of Novolog insulin. If blood sugar is 70 or below, treat with 1/2 cup of clear juice (apple or cranberry) and recheck blood sugar 15 minutes after drinking juice. If blood sugar continues to be 70 or below, notify nurse on arrival to the unit. Pt voiced understanding.   Covid test done 10/01/20 and it's negative. Pt states he's been in quarantine since the test was done and understands that he stays in quarantine until he comes to the hospital tomorrow.

## 2020-10-02 NOTE — Telephone Encounter (Signed)
[employer] forms received. Sent to [employer].

## 2020-10-03 ENCOUNTER — Ambulatory Visit (HOSPITAL_COMMUNITY): Payer: 59 | Admitting: Anesthesiology

## 2020-10-03 ENCOUNTER — Encounter (HOSPITAL_COMMUNITY)
Admission: RE | Disposition: A | Discharge status: Home / Self Care | Payer: Self-pay | Source: Home / Self Care | Attending: Orthopedic Surgery

## 2020-10-03 ENCOUNTER — Other Ambulatory Visit: Payer: Self-pay

## 2020-10-03 ENCOUNTER — Ambulatory Visit (HOSPITAL_COMMUNITY)
Admission: RE | Admit: 2020-10-03 | Discharge: 2020-10-03 | Disposition: A | Discharge status: Home / Self Care | Payer: 59 | Attending: Orthopedic Surgery | Admitting: Orthopedic Surgery

## 2020-10-03 ENCOUNTER — Encounter (HOSPITAL_COMMUNITY): Payer: Self-pay | Admitting: Orthopedic Surgery

## 2020-10-03 DIAGNOSIS — T8469XA Infection and inflammatory reaction due to internal fixation device of other site, initial encounter: Secondary | ICD-10-CM | POA: Insufficient documentation

## 2020-10-03 DIAGNOSIS — R079 Chest pain, unspecified: Secondary | ICD-10-CM | POA: Diagnosis not present

## 2020-10-03 DIAGNOSIS — T847XXD Infection and inflammatory reaction due to other internal orthopedic prosthetic devices, implants and grafts, subsequent encounter: Secondary | ICD-10-CM

## 2020-10-03 DIAGNOSIS — X58XXXA Exposure to other specified factors, initial encounter: Secondary | ICD-10-CM | POA: Insufficient documentation

## 2020-10-03 DIAGNOSIS — Z800 Family history of malignant neoplasm of digestive organs: Secondary | ICD-10-CM | POA: Insufficient documentation

## 2020-10-03 DIAGNOSIS — E1022 Type 1 diabetes mellitus with diabetic chronic kidney disease: Secondary | ICD-10-CM | POA: Diagnosis not present

## 2020-10-03 DIAGNOSIS — Z825 Family history of asthma and other chronic lower respiratory diseases: Secondary | ICD-10-CM | POA: Insufficient documentation

## 2020-10-03 DIAGNOSIS — Z888 Allergy status to other drugs, medicaments and biological substances status: Secondary | ICD-10-CM | POA: Diagnosis not present

## 2020-10-03 DIAGNOSIS — T847XXA Infection and inflammatory reaction due to other internal orthopedic prosthetic devices, implants and grafts, initial encounter: Secondary | ICD-10-CM | POA: Diagnosis not present

## 2020-10-03 DIAGNOSIS — T847XXS Infection and inflammatory reaction due to other internal orthopedic prosthetic devices, implants and grafts, sequela: Secondary | ICD-10-CM | POA: Diagnosis not present

## 2020-10-03 DIAGNOSIS — Z8042 Family history of malignant neoplasm of prostate: Secondary | ICD-10-CM | POA: Insufficient documentation

## 2020-10-03 DIAGNOSIS — J449 Chronic obstructive pulmonary disease, unspecified: Secondary | ICD-10-CM | POA: Insufficient documentation

## 2020-10-03 DIAGNOSIS — N183 Chronic kidney disease, stage 3 unspecified: Secondary | ICD-10-CM | POA: Diagnosis not present

## 2020-10-03 DIAGNOSIS — I2510 Atherosclerotic heart disease of native coronary artery without angina pectoris: Secondary | ICD-10-CM | POA: Insufficient documentation

## 2020-10-03 DIAGNOSIS — Z8249 Family history of ischemic heart disease and other diseases of the circulatory system: Secondary | ICD-10-CM | POA: Insufficient documentation

## 2020-10-03 DIAGNOSIS — Z951 Presence of aortocoronary bypass graft: Secondary | ICD-10-CM | POA: Insufficient documentation

## 2020-10-03 DIAGNOSIS — I129 Hypertensive chronic kidney disease with stage 1 through stage 4 chronic kidney disease, or unspecified chronic kidney disease: Secondary | ICD-10-CM | POA: Diagnosis not present

## 2020-10-03 DIAGNOSIS — M069 Rheumatoid arthritis, unspecified: Secondary | ICD-10-CM | POA: Insufficient documentation

## 2020-10-03 DIAGNOSIS — G4733 Obstructive sleep apnea (adult) (pediatric): Secondary | ICD-10-CM | POA: Diagnosis not present

## 2020-10-03 DIAGNOSIS — F17210 Nicotine dependence, cigarettes, uncomplicated: Secondary | ICD-10-CM | POA: Diagnosis not present

## 2020-10-03 HISTORY — PX: HARDWARE REMOVAL: SHX979

## 2020-10-03 LAB — CBC
HCT: 36.6 % — ABNORMAL LOW (ref 39.0–52.0)
Hemoglobin: 11.7 g/dL — ABNORMAL LOW (ref 13.0–17.0)
MCH: 30.9 pg (ref 26.0–34.0)
MCHC: 32 g/dL (ref 30.0–36.0)
MCV: 96.6 fL (ref 80.0–100.0)
Platelets: 275 10*3/uL (ref 150–400)
RBC: 3.79 MIL/uL — ABNORMAL LOW (ref 4.22–5.81)
RDW: 13.3 % (ref 11.5–15.5)
WBC: 12.9 10*3/uL — ABNORMAL HIGH (ref 4.0–10.5)
nRBC: 0 % (ref 0.0–0.2)

## 2020-10-03 LAB — GLUCOSE, CAPILLARY
Glucose-Capillary: 153 mg/dL — ABNORMAL HIGH (ref 70–99)
Glucose-Capillary: 154 mg/dL — ABNORMAL HIGH (ref 70–99)

## 2020-10-03 LAB — BASIC METABOLIC PANEL
Anion gap: 8 (ref 5–15)
BUN: 30 mg/dL — ABNORMAL HIGH (ref 6–20)
CO2: 23 mmol/L (ref 22–32)
Calcium: 8.6 mg/dL — ABNORMAL LOW (ref 8.9–10.3)
Chloride: 104 mmol/L (ref 98–111)
Creatinine, Ser: 1.52 mg/dL — ABNORMAL HIGH (ref 0.61–1.24)
GFR, Estimated: 53 mL/min — ABNORMAL LOW (ref >60)
Glucose, Bld: 169 mg/dL — ABNORMAL HIGH (ref 70–99)
Potassium: 4.4 mmol/L (ref 3.5–5.1)
Sodium: 135 mmol/L (ref 135–145)

## 2020-10-03 SURGERY — REMOVAL, HARDWARE
Anesthesia: General | Laterality: Left

## 2020-10-03 MED ORDER — GENTAMICIN SULFATE 40 MG/ML IJ SOLN
INTRAMUSCULAR | Status: AC
  Filled 2020-10-03: qty 6

## 2020-10-03 MED ORDER — PROPOFOL 10 MG/ML IV BOLUS
INTRAVENOUS | Status: AC
  Filled 2020-10-03: qty 40

## 2020-10-03 MED ORDER — CHLORHEXIDINE GLUCONATE 0.12 % MT SOLN
15.0000 mL | Freq: Once | OROMUCOSAL | Status: AC
  Administered 2020-10-03: 15 mL via OROMUCOSAL
  Filled 2020-10-03: qty 15

## 2020-10-03 MED ORDER — 0.9 % SODIUM CHLORIDE (POUR BTL) OPTIME
TOPICAL | Status: DC | PRN
  Administered 2020-10-03: 1000 mL

## 2020-10-03 MED ORDER — MIDAZOLAM HCL 5 MG/5ML IJ SOLN
INTRAMUSCULAR | Status: DC | PRN
  Administered 2020-10-03: 2 mg via INTRAVENOUS

## 2020-10-03 MED ORDER — VANCOMYCIN HCL 500 MG IV SOLR
INTRAVENOUS | Status: AC
  Filled 2020-10-03: qty 500

## 2020-10-03 MED ORDER — HYDROMORPHONE HCL 1 MG/ML IJ SOLN
INTRAMUSCULAR | Status: AC
  Administered 2020-10-03: 0.25 mg via INTRAVENOUS
  Filled 2020-10-03: qty 1

## 2020-10-03 MED ORDER — ONDANSETRON HCL 4 MG/2ML IJ SOLN
INTRAMUSCULAR | Status: DC | PRN
  Administered 2020-10-03: 4 mg via INTRAVENOUS

## 2020-10-03 MED ORDER — EPHEDRINE SULFATE 50 MG/ML IJ SOLN
INTRAMUSCULAR | Status: DC | PRN
  Administered 2020-10-03: 10 mg via INTRAVENOUS
  Administered 2020-10-03: 5 mg via INTRAVENOUS

## 2020-10-03 MED ORDER — ORAL CARE MOUTH RINSE: 15.0000 mL | Freq: Once | OROMUCOSAL | Status: AC

## 2020-10-03 MED ORDER — LACTATED RINGERS IV SOLN: INTRAVENOUS | Status: DC

## 2020-10-03 MED ORDER — CEFAZOLIN SODIUM-DEXTROSE 2-4 GM/100ML-% IV SOLN
2.0000 g | INTRAVENOUS | Status: AC
  Administered 2020-10-03: 2 g via INTRAVENOUS
  Filled 2020-10-03: qty 100

## 2020-10-03 MED ORDER — MIDAZOLAM HCL 2 MG/2ML IJ SOLN
INTRAMUSCULAR | Status: AC
  Filled 2020-10-03: qty 2

## 2020-10-03 MED ORDER — OXYCODONE HCL 5 MG/5ML PO SOLN: 5.0000 mg | Freq: Once | ORAL | Status: AC | PRN

## 2020-10-03 MED ORDER — OXYCODONE-ACETAMINOPHEN 5-325 MG PO TABS: 1.0000 | ORAL_TABLET | ORAL | 0 refills | Status: DC | PRN

## 2020-10-03 MED ORDER — ONDANSETRON HCL 4 MG/2ML IJ SOLN: 4.0000 mg | Freq: Once | INTRAMUSCULAR | Status: DC | PRN

## 2020-10-03 MED ORDER — HYDROMORPHONE HCL 1 MG/ML IJ SOLN
0.2500 mg | INTRAMUSCULAR | Status: DC | PRN
  Administered 2020-10-03: 0.25 mg via INTRAVENOUS
  Administered 2020-10-03: 0.5 mg via INTRAVENOUS

## 2020-10-03 MED ORDER — LIDOCAINE 2% (20 MG/ML) 5 ML SYRINGE
INTRAMUSCULAR | Status: DC | PRN
  Administered 2020-10-03: 60 mg via INTRAVENOUS

## 2020-10-03 MED ORDER — OXYCODONE HCL 5 MG PO TABS
ORAL_TABLET | ORAL | Status: AC
  Administered 2020-10-03: 5 mg via ORAL
  Filled 2020-10-03: qty 1

## 2020-10-03 MED ORDER — FENTANYL CITRATE (PF) 250 MCG/5ML IJ SOLN
INTRAMUSCULAR | Status: DC | PRN
  Administered 2020-10-03 (×2): 25 ug via INTRAVENOUS

## 2020-10-03 MED ORDER — VANCOMYCIN HCL 500 MG IV SOLR
INTRAVENOUS | Status: DC | PRN
  Administered 2020-10-03: 500 mg via TOPICAL

## 2020-10-03 MED ORDER — FENTANYL CITRATE (PF) 250 MCG/5ML IJ SOLN
INTRAMUSCULAR | Status: AC
  Filled 2020-10-03: qty 5

## 2020-10-03 MED ORDER — PROPOFOL 10 MG/ML IV BOLUS
INTRAVENOUS | Status: DC | PRN
  Administered 2020-10-03: 200 mg via INTRAVENOUS

## 2020-10-03 MED ORDER — OXYCODONE HCL 5 MG PO TABS: 5.0000 mg | ORAL_TABLET | Freq: Once | ORAL | Status: AC | PRN

## 2020-10-03 MED ORDER — GENTAMICIN SULFATE 40 MG/ML IJ SOLN
INTRAMUSCULAR | Status: DC | PRN
  Administered 2020-10-03 (×2): 80 mg

## 2020-10-03 MED ORDER — BISOPROLOL FUMARATE 5 MG PO TABS
2.5000 mg | ORAL_TABLET | ORAL | Status: AC
  Administered 2020-10-03: 2.5 mg via ORAL
  Filled 2020-10-03 (×2): qty 0.5

## 2020-10-03 SURGICAL SUPPLY — 45 items
BANDAGE ESMARK 6X9 LF (GAUZE/BANDAGES/DRESSINGS) IMPLANT
BNDG COHESIVE 4X5 TAN STRL (GAUZE/BANDAGES/DRESSINGS) ×2 IMPLANT
BNDG ESMARK 6X9 LF (GAUZE/BANDAGES/DRESSINGS)
BNDG GAUZE ELAST 4 BULKY (GAUZE/BANDAGES/DRESSINGS) ×2 IMPLANT
COVER SURGICAL LIGHT HANDLE (MISCELLANEOUS) ×4 IMPLANT
COVER WAND RF STERILE (DRAPES) ×2 IMPLANT
CUFF TOURN SGL QUICK 34 (TOURNIQUET CUFF)
CUFF TOURN SGL QUICK 42 (TOURNIQUET CUFF) IMPLANT
CUFF TRNQT CYL 34X4.125X (TOURNIQUET CUFF) IMPLANT
DRAPE C-ARM 42X72 X-RAY (DRAPES) IMPLANT
DRAPE INCISE IOBAN 66X45 STRL (DRAPES) IMPLANT
DRAPE ORTHO SPLIT 77X108 STRL (DRAPES)
DRAPE SURG ORHT 6 SPLT 77X108 (DRAPES) IMPLANT
DRAPE U-SHAPE 47X51 STRL (DRAPES) ×2 IMPLANT
DRSG EMULSION OIL 3X3 NADH (GAUZE/BANDAGES/DRESSINGS) ×2 IMPLANT
DRSG PAD ABDOMINAL 8X10 ST (GAUZE/BANDAGES/DRESSINGS) ×2 IMPLANT
DURAPREP 26ML APPLICATOR (WOUND CARE) ×2 IMPLANT
ELECT REM PT RETURN 9FT ADLT (ELECTROSURGICAL) ×2
ELECTRODE REM PT RTRN 9FT ADLT (ELECTROSURGICAL) ×1 IMPLANT
GAUZE SPONGE 4X4 12PLY STRL (GAUZE/BANDAGES/DRESSINGS) ×2 IMPLANT
GAUZE SPONGE 4X4 16PLY XRAY LF (GAUZE/BANDAGES/DRESSINGS) ×2 IMPLANT
GLOVE BIOGEL PI IND STRL 9 (GLOVE) ×1 IMPLANT
GLOVE BIOGEL PI INDICATOR 9 (GLOVE) ×1
GLOVE SURG ORTHO 9.0 STRL STRW (GLOVE) ×2 IMPLANT
GOWN STRL REUS W/ TWL XL LVL3 (GOWN DISPOSABLE) ×3 IMPLANT
GOWN STRL REUS W/TWL XL LVL3 (GOWN DISPOSABLE) ×6
KIT BASIN OR (CUSTOM PROCEDURE TRAY) ×2 IMPLANT
KIT TURNOVER KIT B (KITS) ×2 IMPLANT
MANIFOLD NEPTUNE II (INSTRUMENTS) ×2 IMPLANT
NS IRRIG 1000ML POUR BTL (IV SOLUTION) ×2 IMPLANT
PACK ORTHO EXTREMITY (CUSTOM PROCEDURE TRAY) ×2 IMPLANT
PAD ABD 8X10 STRL (GAUZE/BANDAGES/DRESSINGS) ×2 IMPLANT
PAD ARMBOARD 7.5X6 YLW CONV (MISCELLANEOUS) ×4 IMPLANT
SPONGE LAP 18X18 RF (DISPOSABLE) IMPLANT
STAPLER VISISTAT 35W (STAPLE) IMPLANT
STOCKINETTE IMPERVIOUS 9X36 MD (GAUZE/BANDAGES/DRESSINGS) IMPLANT
SUT ETHILON 2 0 PSLX (SUTURE) IMPLANT
SUT VIC AB 0 CT1 27 (SUTURE)
SUT VIC AB 0 CT1 27XBRD ANBCTR (SUTURE) IMPLANT
SUT VIC AB 2-0 CT1 27 (SUTURE)
SUT VIC AB 2-0 CT1 TAPERPNT 27 (SUTURE) IMPLANT
TOWEL GREEN STERILE (TOWEL DISPOSABLE) ×2 IMPLANT
TOWEL GREEN STERILE FF (TOWEL DISPOSABLE) ×2 IMPLANT
UNDERPAD 30X36 HEAVY ABSORB (UNDERPADS AND DIAPERS) ×2 IMPLANT
WATER STERILE IRR 1000ML POUR (IV SOLUTION) ×2 IMPLANT

## 2020-10-03 NOTE — Progress Notes (Signed)
Orthopedic Tech Progress Note Patient Details:  [name] Jul 08, 1966 [id number]  Ortho Devices Type of Ortho Device: Postop shoe/boot Ortho Device/Splint Location: LLE Ortho Device/Splint Interventions: Ordered,Application,Adjustment   Post Interventions Patient Tolerated: Well Instructions Provided: Care of device,Poper ambulation with device,Adjustment of device   Benigno Check 10/03/2020, 10:58 AM

## 2020-10-03 NOTE — H&P (Signed)
[name] is an 55 y.o. male.   Chief Complaint: left foot pain hardware complication HPI: Patient is a 55 year old gentleman he has undergone revision internal fixation for Jones fracture left foot.  Patient states he still has redness along the incision with 3 small wounds that occasionally have opened.  He has been on Levaquin and doxycycline without change in the cellulitis along the incision.  Of note patient states he is periodically having some chest pain on the right.  Patient is status post CABG surgery and his cardiologist is Dr. Jacinto Halim primary care physician Dr. Jarold Motto  Past Medical History:  Diagnosis Date  . Anginal pain (HCC)   . Anxiety   . Arthritis    RA IN HANDS  . Asthma   . CAD (coronary artery disease) 04/27/2014  . Chronic renal disease, stage 3, moderately decreased glomerular filtration rate between 30-59 mL/min/1.73 square meter (HCC) 05/03/2014  . COPD (chronic obstructive pulmonary disease) (HCC)   . Coronary artery disease   . Diabetes (HCC) 05/03/2014  . Diabetes mellitus without complication (HCC)    type 1  . Hyperlipidemia   . Left shoulder pain   . 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/03/2014  . Shortness of breath   . Sleep apnea    mild OSA, no CPAP  . Unstable angina pectoris (HCC) 04/25/2014    Past Surgical History:  Procedure Laterality Date  . CARDIAC CATHETERIZATION  04/25/2014   BY DR Jacinto Halim  . CARDIAC CATHETERIZATION N/A 10/30/2015   Procedure: Left Heart Cath and Cors/Grafts Angiography;  Surgeon: Yates Decamp, MD;  Location: [facility] INVASIVE CV LAB;  Service: Cardiovascular;  Laterality: N/A;  . CARPAL TUNNEL RELEASE    . CATARACT EXTRACTION, BILATERAL    . CORONARY ARTERY BYPASS GRAFT N/A 04/27/2014   Procedure: CORONARY ARTERY BYPASS GRAFTING on pump using left internal mammary artery to LAD coronary artery, right great saphenous vein graft to diagonal coronary artery with sequential to OM1 and circumflex coronary  arteries. Right greater saphenous vein graft to posterior descending coronary artery. ;  Surgeon: Delight Ovens, MD;  Location: [facility] OR;  Service: Open Heart Surgery;  Laterality: N  . elbow drained Left 05/09/2019  . ENDOVEIN HARVEST OF GREATER SAPHENOUS VEIN Right 04/27/2014   Procedure: ENDOVEIN HARVEST OF GREATER SAPHENOUS VEIN;  Surgeon: Delight Ovens, MD;  Location: [facility] OR;  Service: Open Heart Surgery;  Laterality: Right;  . EXCISION ORAL TUMOR N/A 03/01/2018   Procedure: EXCISION ORAL TUMOR;  Surgeon: Christia Reading, MD;  Location: [facility];  Service: ENT;  Laterality: N/A;  . EYE SURGERY     LASER  . EYE SURGERY Bilateral    astigmatism correction   . INTRAOPERATIVE TRANSESOPHAGEAL ECHOCARDIOGRAM N/A 04/27/2014   Procedure: INTRAOPERATIVE TRANSESOPHAGEAL ECHOCARDIOGRAM;  Surgeon: Delight Ovens, MD;  Location: [facility] OR;  Service: Open Heart Surgery;  Laterality: N/A;  . LEFT HEART CATHETERIZATION WITH CORONARY ANGIOGRAM N/A 04/25/2014   Procedure: LEFT HEART CATHETERIZATION WITH CORONARY ANGIOGRAM;  Surgeon: Pamella Pert, MD;  Location: [facility] CATH LAB;  Service: Cardiovascular;  Laterality: N/A;  . MOUTH SURGERY  11/15/2017   tongue surgery   . ORIF TOE FRACTURE Left 03/22/2020   Procedure: OPEN REDUCTION INTERNAL FIXATION (ORIF) Non Union 5th Metatarsal;  Surgeon: Kathryne Hitch, MD;  Location: [facility];  Service: Orthopedics;  Laterality: Left;    Family History  Problem Relation Age of Onset  . Prostate cancer Father   . Heart  disease Father   . Stomach cancer Mother   . Heart disease Brother   . Asthma Daughter   . Healthy Daughter    Social History:  reports that he has been smoking cigarettes. He has a 8.50 pack-year smoking history. He has never used smokeless tobacco. He reports current alcohol use. He reports that he does not use drugs.  Allergies:  Allergies  Allergen Reactions  . Metoprolol Diarrhea    No medications  prior to admission.    Results for orders placed or performed during the hospital encounter of 10/01/20 (from the past 48 hour(s))  SARS CORONAVIRUS 2 (TAT 6-24 HRS) Nasopharyngeal Nasopharyngeal Swab     Status: None   Collection Time: 10/01/20 11:06 AM   Specimen: Nasopharyngeal Swab  Result Value Ref Range   SARS Coronavirus 2 NEGATIVE NEGATIVE    Comment: (NOTE) SARS-CoV-2 target nucleic acids are NOT DETECTED.  The SARS-CoV-2 RNA is generally detectable in upper and lower respiratory specimens during the acute phase of infection. Negative results do not preclude SARS-CoV-2 infection, do not rule out co-infections with other pathogens, and should not be used as the sole basis for treatment or other patient management decisions. Negative results must be combined with clinical observations, patient history, and epidemiological information. The expected result is Negative.  Fact Sheet for Patients: HairSlick.no  Fact Sheet for Healthcare Providers: quierodirigir.com  This test is not yet approved or cleared by the [place] FDA and  has been authorized for detection and/or diagnosis of SARS-CoV-2 by FDA under an Emergency Use Authorization (EUA). This EUA will remain  in effect (meaning this test can be used) for the duration of the COVID-19 declaration under Se ction 564(b)(1) of the Act, 21 U.S.C. section 360bbb-3(b)(1), unless the authorization is terminated or revoked sooner.  Performed at [facility], [address] [place], [place] [address]    No results found.  Review of Systems  All other systems reviewed and are negative.   There were no vitals taken for this visit. Physical Exam  Patient is alert, oriented, no adenopathy, well-dressed, normal affect, normal respiratory effort. Examination there is persistent cellulitis along the surgical incision he does have venous skin color changes there is  no ascending cellulitis there is no drainage there is no fluctuance.Heart RRR Lungs Clear Assessment/Plan Plan: Recommend the patient follow-up with Dr. Jacinto Halim for evaluation of the right chest pain.  Due to the persistent infection status post internal fixation for the Jones fracture will plan for surgical intervention on a Wednesday for removal of the hardware or scraping the bone placing vancomycin powder and when then adjust antibiotics based on tissue cultures.  Risk and benefits were discussed patient states he understands wished to proceed at this time.  [place] Witney Huie, PA 10/03/2020, 5:34 AM

## 2020-10-03 NOTE — Anesthesia Postprocedure Evaluation (Signed)
Anesthesia Post Note  Patient: [name]  Procedure(s) Performed: LEFT FOOT REMOVAL HARDWARE, PLACE VANC POWDER (Left )     Patient location during evaluation: PACU Anesthesia Type: General Level of consciousness: awake and alert, oriented and patient cooperative Pain management: pain level controlled Vital Signs Assessment: post-procedure vital signs reviewed and stable Respiratory status: spontaneous breathing, nonlabored ventilation and respiratory function stable Cardiovascular status: blood pressure returned to baseline and stable Postop Assessment: no apparent nausea or vomiting Anesthetic complications: no   No complications documented.  Last Vitals:  Vitals:   10/03/20 0955 10/03/20 1010  BP: (!) 142/70   Pulse: 71   Resp: 10   Temp:  36.5 C  SpO2: 96%     Last Pain:  Vitals:   10/03/20 1015  TempSrc:   PainSc: 1                  Lannie Fields

## 2020-10-03 NOTE — Op Note (Signed)
10/03/2020  9:14 AM  PATIENT:  [name]    PRE-OPERATIVE DIAGNOSIS:  Infected Hardware Left Foot  POST-OPERATIVE DIAGNOSIS:  Same  PROCEDURE:  LEFT FOOT REMOVAL HARDWARE, PLACE VANC POWDER  SURGEON:  Nadara Mustard, MD  PHYSICIAN ASSISTANT:None ANESTHESIA:   General  PREOPERATIVE INDICATIONS:  IDA UPPAL is a  55 y.o. male with a diagnosis of Infected Hardware Left Foot who failed conservative measures and elected for surgical management.    The risks benefits and alternatives were discussed with the patient preoperatively including but not limited to the risks of infection, bleeding, nerve injury, cardiopulmonary complications, the need for revision surgery, among others, and the patient was willing to proceed.  OPERATIVE IMPLANTS: 500 mg of vancomycin powder and 160 mg gentamicin  @ENCIMAGES @  OPERATIVE FINDINGS: Fibrinous tissue bone and soft tissue sent for cultures.  OPERATIVE PROCEDURE: Patient was brought the operating room and underwent a general anesthetic.  After adequate levels anesthesia were obtained patient's left lower extremity was prepped using DuraPrep draped into a sterile field a timeout was called.  A lateral incision was made over the base of the fifth metatarsal in line with the previous incision.  This was carried sharply down to bone.  There was fibrinous tissue that was sharply excised with a 10 blade knife and rondure and the soft tissue was sent for cultures.  A periosteal elevator was used to further debride with bone with partial excision of the fifth metatarsal.  A guidewire was then used to insert to identify the deep retained hardware C-arm fluoroscopy verified alignment and the screw was removed over the guidewire.  The wound was irrigated with normal saline the shaft was then infiltrated with the vancomycin powder and gentamicin liquid and this was also used to cover over the outside of the bone.  The incision was closed using 2-0 nylon.  A  sterile dressing was applied patient was extubated taken the PACU in stable condition  Debridement type: Excisional Debridement  Side: left  Body Location: base 5th metatarsal left foot   Tools used for debridement: scalpel and rongeur  Pre-debridement Wound size (cm):   Length: 0         Width: 0     Depth: 0   Post-debridement Wound size (cm):   Length: 6        Width: 1     Depth: 1   Debridement depth beyond dead/damaged tissue down to healthy viable tissue: yes  Tissue layer involved: skin, subcutaneous tissue, muscle / fascia, bone  Nature of tissue removed: Devitalized Tissue  Irrigation volume: 1 liter     Irrigation fluid type: Normal Saline       DISCHARGE PLANNING:  Antibiotic duration: Preoperative antibiotics  Weightbearing: Nonweightbearing on the left  Pain medication: Prescription for Percocet  Dressing care/ Wound VAC: Follow-up in the office 1 week to change the dressing  Ambulatory devices: Crutches  Discharge to: Home.  Follow-up: In the office 1 week post operative.

## 2020-10-03 NOTE — Transfer of Care (Signed)
Immediate Anesthesia Transfer of Care Note  Patient: [name]  Procedure(s) Performed: LEFT FOOT REMOVAL HARDWARE, PLACE VANC POWDER (Left )  Patient Location: PACU  Anesthesia Type:General  Level of Consciousness: awake, alert , oriented and patient cooperative  Airway & Oxygen Therapy: Patient Spontanous Breathing and Patient connected to face mask oxygen  Post-op Assessment: Report given to RN, Post -op Vital signs reviewed and stable and Patient moving all extremities  Post vital signs: Reviewed and stable  Last Vitals:  Vitals Value Taken Time  BP    Temp    Pulse    Resp    SpO2      Last Pain:  Vitals:   10/03/20 0712  TempSrc:   PainSc: 0-No pain         Complications: No complications documented.

## 2020-10-03 NOTE — Anesthesia Procedure Notes (Signed)
Procedure Name: LMA Insertion Date/Time: 10/03/2020 8:36 AM Performed by: Lucinda Dell, CRNA Pre-anesthesia Checklist: Emergency Drugs available, Patient identified, Suction available and Patient being monitored Patient Re-evaluated:Patient Re-evaluated prior to induction Oxygen Delivery Method: Circle system utilized Preoxygenation: Pre-oxygenation with 100% oxygen Induction Type: IV induction Ventilation: Mask ventilation without difficulty LMA: LMA inserted LMA Size: 4.0 Number of attempts: 1 Placement Confirmation: positive ETCO2 and breath sounds checked- equal and bilateral Tube secured with: Tape Dental Injury: Teeth and Oropharynx as per pre-operative assessment

## 2020-10-03 NOTE — Interval H&P Note (Signed)
History and Physical Interval Note:  10/03/2020 6:51 AM  [name]  has presented today for surgery, with the diagnosis of Infected Hardware Left Foot.  The various methods of treatment have been discussed with the patient and family. After consideration of risks, benefits and other options for treatment, the patient has consented to  Procedure(s): LEFT FOOT REMOVAL HARDWARE, PLACE VANC POWDER (Left) as a surgical intervention.  The patient's history has been reviewed, patient examined, no change in status, stable for surgery.  I have reviewed the patient's chart and labs.  Questions were answered to the patient's satisfaction.     Nadara Mustard

## 2020-10-04 ENCOUNTER — Encounter (HOSPITAL_COMMUNITY): Payer: Self-pay | Admitting: Orthopedic Surgery

## 2020-10-08 LAB — AEROBIC/ANAEROBIC CULTURE W GRAM STAIN (SURGICAL/DEEP WOUND)

## 2020-10-09 ENCOUNTER — Ambulatory Visit (INDEPENDENT_AMBULATORY_CARE_PROVIDER_SITE_OTHER): Payer: 59 | Admitting: Orthopedic Surgery

## 2020-10-09 DIAGNOSIS — T847XXD Infection and inflammatory reaction due to other internal orthopedic prosthetic devices, implants and grafts, subsequent encounter: Secondary | ICD-10-CM

## 2020-10-16 ENCOUNTER — Ambulatory Visit (INDEPENDENT_AMBULATORY_CARE_PROVIDER_SITE_OTHER): Payer: 59 | Admitting: Physician Assistant

## 2020-10-16 ENCOUNTER — Other Ambulatory Visit: Payer: Self-pay

## 2020-10-16 ENCOUNTER — Encounter: Payer: Self-pay | Admitting: Orthopedic Surgery

## 2020-10-16 ENCOUNTER — Telehealth: Payer: Self-pay | Admitting: Orthopedic Surgery

## 2020-10-16 DIAGNOSIS — T847XXD Infection and inflammatory reaction due to other internal orthopedic prosthetic devices, implants and grafts, subsequent encounter: Secondary | ICD-10-CM

## 2020-10-16 NOTE — Progress Notes (Signed)
Office Visit Note   Patient: [name]           Date of Birth: 1966/03/26           MRN: [id number] Visit Date: 10/16/2020              Requested by: [name], MD [address] [place],  [place] [address] PCP: [name], MD  Chief Complaint  Patient presents with  . Left Foot - Follow-up      HPI: Patient is 2 weeks status post removal of infected hardware from his left foot.  He is doing well and been in a postop shoe.  Cultures from surgery did not have any organisms  Assessment & Plan: Visit Diagnoses: No diagnosis found.  Plan: May transition to a regular shoe but should ensure that he is not getting any abrasion pressure.  We will continue to wear his compression sock.  Follow-up in 2 weeks.  Follow-Up Instructions: No follow-ups on file.   Ortho Exam  Patient is alert, oriented, no adenopathy, well-dressed, normal affect, normal respiratory effort. Examination of his foot sutures are in place swelling is well controlled mild erythema around the sutures but no drainage well approximated wound edges no ascending cellulitis or signs of infection  Imaging: No results found. No images are attached to the encounter.  Labs: Lab Results  Component Value Date   HGBA1C 7.4 (H) 04/25/2014   HGBA1C 7.4 (H) 04/25/2014   ESRSEDRATE 9 09/26/2019   ESRSEDRATE 12 09/09/2017   ESRSEDRATE 58 (H) 06/01/2014   REPTSTATUS 10/08/2020 FINAL 10/03/2020   GRAMSTAIN  10/03/2020    FEW WBC PRESENT, PREDOMINANTLY MONONUCLEAR NO ORGANISMS SEEN    CULT  10/03/2020    NO ANAEROBES ISOLATED Performed at [facility], [address] [place], [place] [address]      Lab Results  Component Value Date   ALBUMIN 3.7 03/15/2018   ALBUMIN 3.7 09/09/2017   ALBUMIN 4.2 03/30/2017    Lab Results  Component Value Date   MG 2.3 04/28/2014   MG 2.4 04/28/2014   MG 2.7 (H) 04/27/2014   Lab Results  Component Value Date   VD25OH 57 06/22/2019    No  results found for: PREALBUMIN CBC EXTENDED Latest Ref Rng & Units 10/03/2020 12/19/2019 09/26/2019  WBC 4.0 - 10.5 K/uL 12.9(H) 10.5 9.6  RBC 4.22 - 5.81 MIL/uL 3.79(L) 4.27 4.46  HGB 13.0 - 17.0 g/dL 11.7(L) 13.1(L) 13.7  HCT 39.0 - 52.0 % 36.6(L) 39.4 41.4  PLT 150 - 400 K/uL 275 251 270  NEUTROABS 1,500 - 7,800 cells/uL - 5,660 5,386  LYMPHSABS 850 - 3,900 cells/uL - 3,507 2,957     There is no height or weight on file to calculate BMI.  Orders:  No orders of the defined types were placed in this encounter.  No orders of the defined types were placed in this encounter.    Procedures: No procedures performed  Clinical Data: No additional findings.  ROS:  All other systems negative, except as noted in the HPI. Review of Systems  Objective: Vital Signs: There were no vitals taken for this visit.  Specialty Comments:  No specialty comments available.  PMFS History: Patient Active Problem List   Diagnosis Date Noted  . Hardware complicating wound infection (HCC)   . Drug therapy 05/04/2020  . 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 Osteopenia of multiple sites 02/28/2020  . PVD (peripheral vascular disease) (HCC)  10/11/2019  . Chronic venous insufficiency 10/05/2018  . Diabetic cataract (HCC) 05/21/2018  . Controlled type 1 diabetes mellitus with diabetic peripheral angiopathy without gangrene (HCC) 08/14/2017  . Dyslipidemia 03/26/2017  . High risk medication use 09/23/2016  . History of hypertension 09/23/2016  . History of chronic kidney disease 09/23/2016  . History of coronary artery disease 09/23/2016  . Tobacco abuse 09/23/2016  . Primary osteoarthritis of both knees 09/23/2016  . History of diabetes mellitus 09/23/2016  . 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 (HCC) 11/20/2015  . Chest pain with high risk for cardiac  etiology 10/29/2015  . Asymptomatic bilateral carotid artery stenosis 06/08/2014  . Pleural effusion, bilateral 06/03/2014  . Dyspnea 06/01/2014  . Diabetes (HCC) 05/03/2014  . 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/03/2014  . 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 Chronic renal disease, stage 3, moderately decreased glomerular filtration rate between 30-59 mL/min/1.73 square meter (HCC) 05/03/2014  . CAD (coronary artery disease) 04/27/2014  . Acne 12/19/2013  . On isotretinoin therapy 12/19/2013  . Nuclear sclerotic cataract, bilateral 12/19/2011  . Vitreous hemorrhage (HCC) 06/16/2011   Past Medical History:  Diagnosis Date  . Anginal pain (HCC)   . Anxiety   . Arthritis    RA IN HANDS  . Asthma   . CAD (coronary artery disease) 04/27/2014  . Chronic renal disease, stage 3, moderately decreased glomerular filtration rate between 30-59 mL/min/1.73 square meter (HCC) 05/03/2014  . COPD (chronic obstructive pulmonary disease) (HCC)   . Coronary artery disease   . Diabetes (HCC) 05/03/2014  . Diabetes mellitus without complication (HCC)    type 1  . Hyperlipidemia   . Left shoulder pain   . 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/03/2014  . Shortness of breath   . Sleep apnea    mild OSA, no CPAP  . Unstable angina pectoris (HCC) 04/25/2014    Family History  Problem Relation Age of Onset  . Prostate cancer Father   . Heart disease Father   . Stomach cancer Mother   . Heart disease Brother   . Asthma Daughter   . Healthy Daughter     Past Surgical History:  Procedure Laterality Date  . CARDIAC CATHETERIZATION  04/25/2014   BY DR Jacinto Halim  . CARDIAC CATHETERIZATION N/A 10/30/2015   Procedure: Left Heart Cath and Cors/Grafts Angiography;  Surgeon: Yates Decamp, MD;  Location: [facility] INVASIVE CV LAB;  Service: Cardiovascular;  Laterality: N/A;  . CARPAL TUNNEL RELEASE    . CATARACT EXTRACTION, BILATERAL    . CORONARY ARTERY BYPASS GRAFT N/A 04/27/2014   Procedure:  CORONARY ARTERY BYPASS GRAFTING on pump using left internal mammary artery to LAD coronary artery, right great saphenous vein graft to diagonal coronary artery with sequential to OM1 and circumflex coronary arteries. Right greater saphenous vein graft to posterior descending coronary artery. ;  Surgeon: Delight Ovens, MD;  Location: [facility];  Service: Open Heart Surgery;  Laterality: N  . elbow drained Left 05/09/2019  . ENDOVEIN HARVEST OF GREATER SAPHENOUS VEIN Right 04/27/2014   Procedure: ENDOVEIN HARVEST OF GREATER SAPHENOUS VEIN;  Surgeon: Delight Ovens, MD;  Location: [facility] OR;  Service: Open Heart Surgery;  Laterality: Right;  . EXCISION ORAL TUMOR N/A 03/01/2018   Procedure: EXCISION ORAL TUMOR;  Surgeon: Christia Reading, MD;  Location: [facility];  Service: ENT;  Laterality: N/A;  . EYE SURGERY  LASER  . EYE SURGERY Bilateral    astigmatism correction   . HARDWARE REMOVAL Left 10/03/2020   Procedure: LEFT FOOT REMOVAL HARDWARE, PLACE VANC POWDER;  Surgeon: Nadara Mustard, MD;  Location: [facility] OR;  Service: Orthopedics;  Laterality: Left;  . INTRAOPERATIVE TRANSESOPHAGEAL ECHOCARDIOGRAM N/A 04/27/2014   Procedure: INTRAOPERATIVE TRANSESOPHAGEAL ECHOCARDIOGRAM;  Surgeon: Delight Ovens, MD;  Location: [facility] OR;  Service: Open Heart Surgery;  Laterality: N/A;  . LEFT HEART CATHETERIZATION WITH CORONARY ANGIOGRAM N/A 04/25/2014   Procedure: LEFT HEART CATHETERIZATION WITH CORONARY ANGIOGRAM;  Surgeon: Pamella Pert, MD;  Location: [facility] CATH LAB;  Service: Cardiovascular;  Laterality: N/A;  . MOUTH SURGERY  11/15/2017   tongue surgery   . ORIF TOE FRACTURE Left 03/22/2020   Procedure: OPEN REDUCTION INTERNAL FIXATION (ORIF) Non Union 5th Metatarsal;  Surgeon: Kathryne Hitch, MD;  Location: [facility];  Service: Orthopedics;  Laterality: Left;   Social History   Occupational History  . Not on file  Tobacco Use  . Smoking status: Current Every Day  Smoker    Packs/day: 0.25    Years: 34.00    Pack years: 8.50    Types: Cigarettes  . Smokeless tobacco: Never Used  Vaping Use  . Vaping Use: Never used  Substance and Sexual Activity  . Alcohol use: Yes    Alcohol/week: 0.0 standard drinks    Comment: RARE  . Drug use: No  . Sexual activity: Not on file

## 2020-10-16 NOTE — Telephone Encounter (Signed)
Received $25.00 cash,medical records release form and disability/fmla paperwork from patient-   Forwarding to [facility]

## 2020-10-22 ENCOUNTER — Encounter: Payer: Self-pay | Admitting: Orthopedic Surgery

## 2020-10-22 NOTE — Progress Notes (Signed)
Office Visit Note   Patient: [name]           Date of Birth: September 13, 1965           MRN: [id number] Visit Date: 10/09/2020              Requested by: [name], MD [address] [place],  [place] [address] PCP: [name], MD  Chief Complaint  Patient presents with  . Left Foot - Routine Post Op    10/03/20 left foot removal HDW van powder       HPI: Patient is a 55 year old gentleman who is seen status post left foot removal of hardware application of vancomycin powder.  Assessment & Plan: Visit Diagnoses:  1. Hardware complicating wound infection, subsequent encounter     Plan: We will continue with protected weightbearing.  Anticipate potential out of work for 1 month will adjust according to progress at follow-up.  Follow-Up Instructions: Return in about 2 weeks (around 10/23/2020).   Ortho Exam  Patient is alert, oriented, no adenopathy, well-dressed, normal affect, normal respiratory effort. Examination patient states he has not been smoking since surgery.  The wound edges are well approximated he was put in a compression sock plan to follow-up in 1 to 2 weeks cultures showed no organisms will hold on antibiotics.  Imaging: No results found. No images are attached to the encounter.  Labs: Lab Results  Component Value Date   HGBA1C 7.4 (H) 04/25/2014   HGBA1C 7.4 (H) 04/25/2014   ESRSEDRATE 9 09/26/2019   ESRSEDRATE 12 09/09/2017   ESRSEDRATE 58 (H) 06/01/2014   REPTSTATUS 10/08/2020 FINAL 10/03/2020   GRAMSTAIN  10/03/2020    FEW WBC PRESENT, PREDOMINANTLY MONONUCLEAR NO ORGANISMS SEEN    CULT  10/03/2020    NO ANAEROBES ISOLATED Performed at [facility], [address], [place], [place] [address]      Lab Results  Component Value Date   ALBUMIN 3.7 03/15/2018   ALBUMIN 3.7 09/09/2017   ALBUMIN 4.2 03/30/2017    Lab Results  Component Value Date   MG 2.3 04/28/2014   MG 2.4 04/28/2014   MG 2.7 (H) 04/27/2014    Lab Results  Component Value Date   VD25OH 57 06/22/2019    No results found for: PREALBUMIN CBC EXTENDED Latest Ref Rng & Units 10/03/2020 12/19/2019 09/26/2019  WBC 4.0 - 10.5 K/uL 12.9(H) 10.5 9.6  RBC 4.22 - 5.81 MIL/uL 3.79(L) 4.27 4.46  HGB 13.0 - 17.0 g/dL 11.7(L) 13.1(L) 13.7  HCT 39.0 - 52.0 % 36.6(L) 39.4 41.4  PLT 150 - 400 K/uL 275 251 270  NEUTROABS 1,500 - 7,800 cells/uL - 5,660 5,386  LYMPHSABS 850 - 3,900 cells/uL - 3,507 2,957     There is no height or weight on file to calculate BMI.  Orders:  No orders of the defined types were placed in this encounter.  No orders of the defined types were placed in this encounter.    Procedures: No procedures performed  Clinical Data: No additional findings.  ROS:  All other systems negative, except as noted in the HPI. Review of Systems  Objective: Vital Signs: There were no vitals taken for this visit.  Specialty Comments:  No specialty comments available.  PMFS History: Patient Active Problem List   Diagnosis Date Noted  . Hardware complicating wound infection (HCC)   . Drug therapy 05/04/2020  . 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 Osteopenia of multiple  sites 02/28/2020  . PVD (peripheral vascular disease) (HCC) 10/11/2019  . Chronic venous insufficiency 10/05/2018  . Diabetic cataract (HCC) 05/21/2018  . Controlled type 1 diabetes mellitus with diabetic peripheral angiopathy without gangrene (HCC) 08/14/2017  . Dyslipidemia 03/26/2017  . High risk medication use 09/23/2016  . History of hypertension 09/23/2016  . History of chronic kidney disease 09/23/2016  . History of coronary artery disease 09/23/2016  . Tobacco abuse 09/23/2016  . Primary osteoarthritis of both knees 09/23/2016  . History of diabetes mellitus 09/23/2016  . 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 2  diabetes mellitus (HCC) 11/20/2015  . Chest pain with high risk for cardiac etiology 10/29/2015  . Asymptomatic bilateral carotid artery stenosis 06/08/2014  . Pleural effusion, bilateral 06/03/2014  . Dyspnea 06/01/2014  . Diabetes (HCC) 05/03/2014  . 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/03/2014  . 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 Chronic renal disease, stage 3, moderately decreased glomerular filtration rate between 30-59 mL/min/1.73 square meter (HCC) 05/03/2014  . CAD (coronary artery disease) 04/27/2014  . Acne 12/19/2013  . On isotretinoin therapy 12/19/2013  . Nuclear sclerotic cataract, bilateral 12/19/2011  . Vitreous hemorrhage (HCC) 06/16/2011   Past Medical History:  Diagnosis Date  . Anginal pain (HCC)   . Anxiety   . Arthritis    RA IN HANDS  . Asthma   . CAD (coronary artery disease) 04/27/2014  . Chronic renal disease, stage 3, moderately decreased glomerular filtration rate between 30-59 mL/min/1.73 square meter (HCC) 05/03/2014  . COPD (chronic obstructive pulmonary disease) (HCC)   . Coronary artery disease   . Diabetes (HCC) 05/03/2014  . Diabetes mellitus without complication (HCC)    type 1  . Hyperlipidemia   . Left shoulder pain   . 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/03/2014  . Shortness of breath   . Sleep apnea    mild OSA, no CPAP  . Unstable angina pectoris (HCC) 04/25/2014    Family History  Problem Relation Age of Onset  . Prostate cancer Father   . Heart disease Father   . Stomach cancer Mother   . Heart disease Brother   . Asthma Daughter   . Healthy Daughter     Past Surgical History:  Procedure Laterality Date  . CARDIAC CATHETERIZATION  04/25/2014   BY DR Jacinto Halim  . CARDIAC CATHETERIZATION N/A 10/30/2015   Procedure: Left Heart Cath and Cors/Grafts Angiography;  Surgeon: Yates Decamp, MD;  Location: [facility] INVASIVE CV LAB;  Service: Cardiovascular;  Laterality: N/A;  . CARPAL TUNNEL RELEASE    . CATARACT  EXTRACTION, BILATERAL    . CORONARY ARTERY BYPASS GRAFT N/A 04/27/2014   Procedure: CORONARY ARTERY BYPASS GRAFTING on pump using left internal mammary artery to LAD coronary artery, right great saphenous vein graft to diagonal coronary artery with sequential to OM1 and circumflex coronary arteries. Right greater saphenous vein graft to posterior descending coronary artery. ;  Surgeon: Delight Ovens, MD;  Location: [facility] OR;  Service: Open Heart Surgery;  Laterality: N  . elbow drained Left 05/09/2019  . ENDOVEIN HARVEST OF GREATER SAPHENOUS VEIN Right 04/27/2014   Procedure: ENDOVEIN HARVEST OF GREATER SAPHENOUS VEIN;  Surgeon: Delight Ovens, MD;  Location: [facility] OR;  Service: Open Heart Surgery;  Laterality: Right;  . EXCISION ORAL TUMOR N/A 03/01/2018   Procedure: EXCISION ORAL TUMOR;  Surgeon: Christia Reading, MD;  Location: [facility];  Service: ENT;  Laterality: N/A;  . EYE SURGERY     LASER  . EYE SURGERY Bilateral    astigmatism correction   . HARDWARE REMOVAL Left 10/03/2020   Procedure: LEFT FOOT REMOVAL HARDWARE, PLACE VANC POWDER;  Surgeon: Nadara Mustard, MD;  Location: [facility] OR;  Service: Orthopedics;  Laterality: Left;  . INTRAOPERATIVE TRANSESOPHAGEAL ECHOCARDIOGRAM N/A 04/27/2014   Procedure: INTRAOPERATIVE TRANSESOPHAGEAL ECHOCARDIOGRAM;  Surgeon: Delight Ovens, MD;  Location[facility] OR;  Service: Open Heart Surgery;  Laterality: N/A;  . LEFT HEART CATHETERIZATION WITH CORONARY ANGIOGRAM N/A 04/25/2014   Procedure: LEFT HEART CATHETERIZATION WITH CORONARY ANGIOGRAM;  Surgeon: Pamella Pert, MD;  Location: [facility] CATH LAB;  Service: Cardiovascular;  Laterality: N/A;  . MOUTH SURGERY  11/15/2017   tongue surgery   . ORIF TOE FRACTURE Left 03/22/2020   Procedure: OPEN REDUCTION INTERNAL FIXATION (ORIF) Non Union 5th Metatarsal;  Surgeon: Kathryne Hitch, MD;  Location: [facility];  Service: Orthopedics;  Laterality: Left;   Social History    Occupational History  . Not on file  Tobacco Use  . Smoking status: Current Every Day Smoker    Packs/day: 0.25    Years: 34.00    Pack years: 8.50    Types: Cigarettes  . Smokeless tobacco: Never Used  Vaping Use  . Vaping Use: Never used  Substance and Sexual Activity  . Alcohol use: Yes    Alcohol/week: 0.0 standard drinks    Comment: RARE  . Drug use: No  . Sexual activity: Not on file

## 2020-10-25 ENCOUNTER — Other Ambulatory Visit: Payer: Self-pay

## 2020-10-25 ENCOUNTER — Ambulatory Visit: Payer: 59

## 2020-10-25 DIAGNOSIS — I6523 Occlusion and stenosis of bilateral carotid arteries: Secondary | ICD-10-CM

## 2020-10-26 NOTE — Progress Notes (Signed)
I released it to his portal and ordered repeat in a year

## 2020-10-26 NOTE — Progress Notes (Signed)
Mild bilateral carotid stenosis

## 2020-10-30 ENCOUNTER — Ambulatory Visit (INDEPENDENT_AMBULATORY_CARE_PROVIDER_SITE_OTHER): Payer: 59 | Admitting: Orthopedic Surgery

## 2020-10-30 ENCOUNTER — Encounter: Payer: Self-pay | Admitting: Orthopedic Surgery

## 2020-10-30 DIAGNOSIS — T847XXD Infection and inflammatory reaction due to other internal orthopedic prosthetic devices, implants and grafts, subsequent encounter: Secondary | ICD-10-CM

## 2020-10-30 NOTE — Progress Notes (Signed)
Office Visit Note   Patient: [name]           Date of Birth: 1965-12-09           MRN: [id number] Visit Date: 10/30/2020              Requested by: [name], MD [address] [place],  [place] [address] PCP: [name], MD  Chief Complaint  Patient presents with  . Left Foot - Routine Post Op    10/03/20 left foot removal HDW abx powder       HPI: Patient is a 55 year old gentleman who presents in follow-up approximately 4 weeks status post removal of deep retained hardware and placement of antibiotic powder.  Patient is in regular shoewear he has been back at work without problems patient states the redness has resolved the discoloration in his foot has resolved and the swelling has resolved as well.  Patient states he has stopped smoking since the revision surgery.  He states his A1c is 5.7.  He is working with Dr. Jarold Motto.  Assessment & Plan: Visit Diagnoses:  1. Hardware complicating wound infection, subsequent encounter     Plan: Continue with activities as tolerated without restrictions.  Follow-Up Instructions: Return if symptoms worsen or fail to improve.   Ortho Exam  Patient is alert, oriented, no adenopathy, well-dressed, normal affect, normal respiratory effort. Examination the venous edema has improved in the left foot there is no redness or cellulitis along the incision it is well-healed.  There is no pain to palpation.  Imaging: No results found. No images are attached to the encounter.  Labs: Lab Results  Component Value Date   HGBA1C 7.4 (H) 04/25/2014   HGBA1C 7.4 (H) 04/25/2014   ESRSEDRATE 9 09/26/2019   ESRSEDRATE 12 09/09/2017   ESRSEDRATE 58 (H) 06/01/2014   REPTSTATUS 10/08/2020 FINAL 10/03/2020   GRAMSTAIN  10/03/2020    FEW WBC PRESENT, PREDOMINANTLY MONONUCLEAR NO ORGANISMS SEEN    CULT  10/03/2020    NO ANAEROBES ISOLATED Performed at [facility], [address] [place], [place] [address]      Lab  Results  Component Value Date   ALBUMIN 3.7 03/15/2018   ALBUMIN 3.7 09/09/2017   ALBUMIN 4.2 03/30/2017    Lab Results  Component Value Date   MG 2.3 04/28/2014   MG 2.4 04/28/2014   MG 2.7 (H) 04/27/2014   Lab Results  Component Value Date   VD25OH 57 06/22/2019    No results found for: PREALBUMIN CBC EXTENDED Latest Ref Rng & Units 10/03/2020 12/19/2019 09/26/2019  WBC 4.0 - 10.5 K/uL 12.9(H) 10.5 9.6  RBC 4.22 - 5.81 MIL/uL 3.79(L) 4.27 4.46  HGB 13.0 - 17.0 g/dL 11.7(L) 13.1(L) 13.7  HCT 39.0 - 52.0 % 36.6(L) 39.4 41.4  PLT 150 - 400 K/uL 275 251 270  NEUTROABS 1,500 - 7,800 cells/uL - 5,660 5,386  LYMPHSABS 850 - 3,900 cells/uL - 3,507 2,957     There is no height or weight on file to calculate BMI.  Orders:  No orders of the defined types were placed in this encounter.  No orders of the defined types were placed in this encounter.    Procedures: No procedures performed  Clinical Data: No additional findings.  ROS:  All other systems negative, except as noted in the HPI. Review of Systems  Objective: Vital Signs: There were no vitals taken for this visit.  Specialty Comments:  No specialty comments available.  PMFS History:  Patient Active Problem List   Diagnosis Date Noted  . Hardware complicating wound infection (HCC)   . Drug therapy 05/04/2020  . 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 Osteopenia of multiple sites 02/28/2020  . PVD (peripheral vascular disease) (HCC) 10/11/2019  . Chronic venous insufficiency 10/05/2018  . Diabetic cataract (HCC) 05/21/2018  . Controlled type 1 diabetes mellitus with diabetic peripheral angiopathy without gangrene (HCC) 08/14/2017  . Dyslipidemia 03/26/2017  . High risk medication use 09/23/2016  . History of hypertension 09/23/2016  . History of chronic kidney disease 09/23/2016  . History of coronary artery disease 09/23/2016  . Tobacco abuse 09/23/2016  . Primary  osteoarthritis of both knees 09/23/2016  . History of diabetes mellitus 09/23/2016  . 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 (HCC) 11/20/2015  . Chest pain with high risk for cardiac etiology 10/29/2015  . Asymptomatic bilateral carotid artery stenosis 06/08/2014  . Pleural effusion, bilateral 06/03/2014  . Dyspnea 06/01/2014  . Diabetes (HCC) 05/03/2014  . 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/03/2014  . 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 Chronic renal disease, stage 3, moderately decreased glomerular filtration rate between 30-59 mL/min/1.73 square meter (HCC) 05/03/2014  . CAD (coronary artery disease) 04/27/2014  . Acne 12/19/2013  . On isotretinoin therapy 12/19/2013  . Nuclear sclerotic cataract, bilateral 12/19/2011  . Vitreous hemorrhage (HCC) 06/16/2011   Past Medical History:  Diagnosis Date  . Anginal pain (HCC)   . Anxiety   . Arthritis    RA IN HANDS  . Asthma   . CAD (coronary artery disease) 04/27/2014  . Chronic renal disease, stage 3, moderately decreased glomerular filtration rate between 30-59 mL/min/1.73 square meter (HCC) 05/03/2014  . COPD (chronic obstructive pulmonary disease) (HCC)   . Coronary artery disease   . Diabetes (HCC) 05/03/2014  . Diabetes mellitus without complication (HCC)    type 1  . Hyperlipidemia   . Left shoulder pain   . 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/03/2014  . Shortness of breath   . Sleep apnea    mild OSA, no CPAP  . Unstable angina pectoris (HCC) 04/25/2014    Family History  Problem Relation Age of Onset  . Prostate cancer Father   . Heart disease Father   . Stomach cancer Mother   . Heart disease Brother   . Asthma Daughter   . Healthy Daughter     Past Surgical History:  Procedure Laterality Date  . CARDIAC CATHETERIZATION  04/25/2014   BY DR Jacinto Halim  . CARDIAC CATHETERIZATION N/A  10/30/2015   Procedure: Left Heart Cath and Cors/Grafts Angiography;  Surgeon: Yates Decamp, MD;  Location: [facility] INVASIVE CV LAB;  Service: Cardiovascular;  Laterality: N/A;  . CARPAL TUNNEL RELEASE    . CATARACT EXTRACTION, BILATERAL    . CORONARY ARTERY BYPASS GRAFT N/A 04/27/2014   Procedure: CORONARY ARTERY BYPASS GRAFTING on pump using left internal mammary artery to LAD coronary artery, right great saphenous vein graft to diagonal coronary artery with sequential to OM1 and circumflex coronary arteries. Right greater saphenous vein graft to posterior descending coronary artery. ;  Surgeon: Delight Ovens, MD;  Location: [facility] OR;  Service: Open Heart Surgery;  Laterality: N  . elbow drained Left 05/09/2019  . ENDOVEIN HARVEST OF GREATER SAPHENOUS VEIN Right 04/27/2014   Procedure: ENDOVEIN HARVEST OF GREATER SAPHENOUS VEIN;  Surgeon: Delight Ovens, MD;  Location: [facility] OR;  Service: Open Heart Surgery;  Laterality: Right;  . EXCISION ORAL TUMOR N/A 03/01/2018   Procedure: EXCISION ORAL TUMOR;  Surgeon: Christia Reading, MD;  Location: [facility];  Service: ENT;  Laterality: N/A;  . EYE SURGERY     LASER  . EYE SURGERY Bilateral    astigmatism correction   . HARDWARE REMOVAL Left 10/03/2020   Procedure: LEFT FOOT REMOVAL HARDWARE, PLACE VANC POWDER;  Surgeon: Nadara Mustard, MD;  Location: [facility] OR;  Service: Orthopedics;  Laterality: Left;  . INTRAOPERATIVE TRANSESOPHAGEAL ECHOCARDIOGRAM N/A 04/27/2014   Procedure: INTRAOPERATIVE TRANSESOPHAGEAL ECHOCARDIOGRAM;  Surgeon: Delight Ovens, MD;  Location: [facility] OR;  Service: Open Heart Surgery;  Laterality: N/A;  . LEFT HEART CATHETERIZATION WITH CORONARY ANGIOGRAM N/A 04/25/2014   Procedure: LEFT HEART CATHETERIZATION WITH CORONARY ANGIOGRAM;  Surgeon: Pamella Pert, MD;  Location: [facility] CATH LAB;  Service: Cardiovascular;  Laterality: N/A;  . MOUTH SURGERY  11/15/2017   tongue surgery   . ORIF TOE FRACTURE Left 03/22/2020   Procedure: OPEN  REDUCTION INTERNAL FIXATION (ORIF) Non Union 5th Metatarsal;  Surgeon: Kathryne Hitch, MD;  Location: [facility];  Service: Orthopedics;  Laterality: Left;   Social History   Occupational History  . Not on file  Tobacco Use  . Smoking status: Current Every Day Smoker    Packs/day: 0.25    Years: 34.00    Pack years: 8.50    Types: Cigarettes  . Smokeless tobacco: Never Used  Vaping Use  . Vaping Use: Never used  Substance and Sexual Activity  . Alcohol use: Yes    Alcohol/week: 0.0 standard drinks    Comment: RARE  . Drug use: No  . Sexual activity: Not on file

## 2020-11-14 NOTE — Progress Notes (Signed)
Primary Physician:  Leanna Battles, MD  Patient ID: [name], male    DOB: Mar 13, 1966, 55 y.o.   MRN: [id number]  Subjective:   Chief Complaint  Patient presents with  . Coronary Artery Disease  . Follow-up  . Results   HPI: [name]  is a 55 y.o. male  with history of Type I Diabetes, rheumatoid arthritis, hyperlipidemia, and CAD S/P coronary artery bypass grafting on 04/27/2014.    Patient presents for 8-week follow-up of cardiac testing.  Since last visit patient has undergone surgery by Dr. Sharol Given to have previously placed hardware removed from his foot.  Patient has recovered well from the surgery and has not returned to work full duty.  Patient states he is doing well without specific complaints today.  Denies chest pain, palpitations, syncope, near syncope, dizziness.  He does continue to have mild dyspnea on exertion, which is chronic and stable.  Patient reports he has recently stopped smoking as of October 03, 2020, however he does continue to vape which she is slowly weaning himself off of.  Patient states he hopes to quit vaping by the end of this month.  Most recent A1c was 5.7%.  Past Medical History:  Diagnosis Date  . Anginal pain ([place])   . Anxiety   . Arthritis    RA IN HANDS  . Asthma   . CAD (coronary artery disease) 04/27/2014  . Chronic renal disease, stage 3, moderately decreased glomerular filtration rate between 30-59 mL/min/1.73 square meter (HCC) 05/03/2014  . COPD (chronic obstructive pulmonary disease) ([place])   . Coronary artery disease   . Diabetes ([place]) 05/03/2014  . Diabetes mellitus without complication ([place])    type 1  . Hyperlipidemia   . Left shoulder pain   . Rheumatoid arthritis involving multiple joints ([place]) 05/03/2014  . Shortness of breath   . Sleep apnea    mild OSA, no CPAP  . Unstable angina pectoris ([place]) 04/25/2014    Past Surgical History:  Procedure Laterality Date  . CARDIAC CATHETERIZATION  04/25/2014   BY DR Einar Gip  .  CARDIAC CATHETERIZATION N/A 10/30/2015   Procedure: Left Heart Cath and Cors/Grafts Angiography;  Surgeon: Adrian Prows, MD;  Location: [place];  Service: Cardiovascular;  Laterality: N/A;  . CARPAL TUNNEL RELEASE    . CATARACT EXTRACTION, BILATERAL    . CORONARY ARTERY BYPASS GRAFT N/A 04/27/2014   Procedure: CORONARY ARTERY BYPASS GRAFTING on pump using left internal mammary artery to LAD coronary artery, right great saphenous vein graft to diagonal coronary artery with sequential to OM1 and circumflex coronary arteries. Right greater saphenous vein graft to posterior descending coronary artery. ;  Surgeon: Grace Isaac, MD;  Location: [place];  Service: Open Heart Surgery;  Laterality: N  . elbow drained Left 05/09/2019  . ENDOVEIN HARVEST OF GREATER SAPHENOUS VEIN Right 04/27/2014   Procedure: ENDOVEIN HARVEST OF GREATER SAPHENOUS VEIN;  Surgeon: Grace Isaac, MD;  Location: [place];  Service: Open Heart Surgery;  Laterality: Right;  . EXCISION ORAL TUMOR N/A 03/01/2018   Procedure: EXCISION ORAL TUMOR;  Surgeon: Melida Quitter, MD;  Location: [place];  Service: ENT;  Laterality: N/A;  . EYE SURGERY     LASER  . EYE SURGERY Bilateral    astigmatism correction   . HARDWARE REMOVAL Left 10/03/2020   Procedure: LEFT FOOT REMOVAL HARDWARE, PLACE VANC POWDER;  Surgeon: Newt Minion, MD;  Location: [place];  Service: Orthopedics;  Laterality: Left;  . INTRAOPERATIVE TRANSESOPHAGEAL ECHOCARDIOGRAM N/A 04/27/2014   Procedure: INTRAOPERATIVE TRANSESOPHAGEAL ECHOCARDIOGRAM;  Surgeon: Grace Isaac, MD;  Location: [place];  Service: Open Heart Surgery;  Laterality: N/A;  . LEFT HEART CATHETERIZATION WITH CORONARY ANGIOGRAM N/A 04/25/2014   Procedure: LEFT HEART CATHETERIZATION WITH CORONARY ANGIOGRAM;  Surgeon: Laverda Page, MD;  [facility];  Service: Cardiovascular;  Laterality: N/A;  . MOUTH SURGERY  11/15/2017   tongue surgery   . ORIF TOE FRACTURE Left  03/22/2020   Procedure: OPEN REDUCTION INTERNAL FIXATION (ORIF) Non [place] 5th Metatarsal;  Surgeon: Mcarthur Rossetti, MD;  Location: [place];  Service: Orthopedics;  Laterality: Left;   Family History  Problem Relation Age of Onset  . Prostate cancer Father   . Heart disease Father   . Stomach cancer Mother   . Heart disease Brother   . Asthma Daughter   . Healthy Daughter     Social History   Tobacco Use  . Smoking status: Current Every Day Smoker    Packs/day: 0.25    Years: 34.00    Pack years: 8.50    Types: Cigarettes  . Smokeless tobacco: Never Used  Substance Use Topics  . Alcohol use: Yes    Alcohol/week: 0.0 standard drinks    Comment: RARE    Marital status: Divorced   Review of Systems  Constitutional: Negative for malaise/fatigue and weight gain.  Cardiovascular: Positive for dyspnea on exertion (stable). Negative for chest pain, claudication, leg swelling, near-syncope, orthopnea, palpitations, paroxysmal nocturnal dyspnea and syncope.  Respiratory: Negative for shortness of breath.   Hematologic/Lymphatic: Does not bruise/bleed easily.  Musculoskeletal: Positive for arthritis, back pain and neck pain.  Gastrointestinal: Negative for melena.  Neurological: Negative for dizziness and weakness.   Objective:  Blood pressure 125/70, pulse 61, resp. rate 16, height 5' 8"  (1.727 m), weight 208 lb (94.3 kg), SpO2 97 %. Body mass index is 31.63 kg/m.   Vitals with BMI 11/15/2020 10/03/2020 10/03/2020  Height 5' 8"  - -  Weight 208 lbs - -  BMI 42.87 - -  Systolic 681 - 157  Diastolic 70 - 80  Pulse 61 63 65    Physical Exam Vitals reviewed.  Constitutional:      Appearance: He is well-developed.  HENT:     Head: Normocephalic and atraumatic.  Cardiovascular:     Rate and Rhythm: Normal rate and regular rhythm.     Pulses: Intact distal pulses.          Carotid pulses are on the right side with bruit and on the left side with bruit.       Femoral pulses are 2+ on the right side and 2+ on the left side.      Popliteal pulses are 2+ on the right side and 2+ on the left side.       Dorsalis pedis pulses are 1+ on the right side and 2+ on the left side.       Posterior tibial pulses are 1+ on the right side and 1+ on the left side.     Heart sounds: Normal heart sounds, S1 normal and S2 normal. No murmur heard. No gallop.   Pulmonary:     Effort: Pulmonary effort is normal. No accessory muscle usage or respiratory distress.     Breath sounds: Normal breath sounds. No wheezing, rhonchi or rales.  Chest:     Chest wall: No tenderness.  Abdominal:  General: Bowel sounds are normal.     Palpations: Abdomen is soft.  Musculoskeletal:     Right lower leg: Edema (trace) present.     Left lower leg: Edema (trace) present.  Skin:    Comments: Hyperpigmentation of the left foot Surgical wound well healed  Neurological:     Mental Status: He is alert.    Laboratory examination:    CMP Latest Ref Rng & Units 10/03/2020 03/19/2020 12/19/2019  Glucose 70 - 99 mg/dL 169(H) 228(H) 89  BUN 6 - 20 mg/dL 30(H) 10 14  Creatinine 0.61 - 1.24 mg/dL 1.52(H) 1.38(H) 1.54(H)  Sodium 135 - 145 mmol/L 135 139 138  Potassium 3.5 - 5.1 mmol/L 4.4 4.9 4.6  Chloride 98 - 111 mmol/L 104 105 105  CO2 22 - 32 mmol/L 23 26 25   Calcium 8.9 - 10.3 mg/dL 8.6(L) 8.9 9.0  Total Protein 6.1 - 8.1 g/dL - - 6.5  Total Bilirubin 0.2 - 1.2 mg/dL - - 0.3  Alkaline Phos 38 - 126 U/L - - -  AST 10 - 35 U/L - - 37(H)  ALT 9 - 46 U/L - - 40   CBC Latest Ref Rng & Units 10/03/2020 12/19/2019 09/26/2019  WBC 4.0 - 10.5 K/uL 12.9(H) 10.5 9.6  Hemoglobin 13.0 - 17.0 g/dL 11.7(L) 13.1(L) 13.7  Hematocrit 39.0 - 52.0 % 36.6(L) 39.4 41.4  Platelets 150 - 400 K/uL 275 251 270   Lipid Panel     Component Value Date/Time   CHOL 55 (L) 02/10/2019 0907   TRIG 43 02/10/2019 0907   HDL 29 (L) 02/10/2019 0907   CHOLHDL 1.9 02/10/2019 0907   LDLCALC 17 02/10/2019 0907    HEMOGLOBIN A1C Lab Results  Component Value Date   HGBA1C 7.4 (H) 04/25/2014   MPG 166 (H) 04/25/2014   TSH No results for input(s): TSH in the last 8760 hours.  Allergies and medications    Allergies  Allergen Reactions  . Metoprolol Diarrhea   Current Outpatient Medications  Medication Instructions  . albuterol (VENTOLIN HFA) 108 (90 Base) MCG/ACT inhaler 2 puffs, Inhalation, Every 6 hours PRN  . aspirin 81 mg, Daily  . bisoprolol (ZEBETA) 2.5 mg, Oral, Daily  . Blood Glucose Monitoring Suppl (ONETOUCH VERIO FLEX SYSTEM) w/Device KIT USE TO CHECK BLOOD GLUCOSE UP TO 10 TIMES PER DAY  . buPROPion (WELLBUTRIN SR) 150 mg, Oral, Daily  . COD LIVER OIL PO 415 mg, Oral, Daily  . Continuous Blood Gluc Sensor (FREESTYLE LIBRE 14 DAY SENSOR) MISC CHANGE EVERY 14 DAYS TO MONITOR BLOOD GLUCOSE  . Continuous Blood Gluc Sensor (FREESTYLE LIBRE 14 DAY SENSOR) MISC CHANGE EVERY 14 DAYS TO MONITOR BLOOD GLUOSE  . gabapentin (NEURONTIN) 300 mg, Oral, Daily at bedtime  . HumaLOG 15-20 Units, Subcutaneous, 3 times daily with meals, Sliding scale Depending on carb intake  . insulin glargine (LANTUS) 14-17 Units, Subcutaneous, See admin instructions, Inject into the skin 17 units in the morning and 14 unit at bedtime  . Lidocaine 4 % PTCH 1 patch, Transdermal, Nightly  . magnesium gluconate (MAGONATE) 500 mg, Oral, Daily  . nitroGLYCERIN (NITROSTAT) 0.4 MG SL tablet DISSOLVE 1 TABLET UNDER THE TONGUE EVERY 5 MINUTES AS  NEEDED FOR CHEST PAIN. MAX  OF 3 TABLETS IN 15 MINUTES. CALL 911 IF PAIN PERSISTS.  Marland Kitchen oxymetazoline (AFRIN) 0.05 % nasal spray 2 sprays, Each Nare, Daily at bedtime  . tadalafil (CIALIS) 20 mg, Oral, Daily PRN  . vitamin C 1,000 mg, Oral, Daily  .  Vitamin D3 5,000 Units, Oral, Daily  . zinc gluconate 50 mg, Oral, Daily    Radiology   No results found.  Cardiac Studies:   CABG 04/27/2014: LIMA to LAD, SVG to D1, SVG to OM1 and distal circumflex, SVG to PDA. Dr. Ceasar Mons.  Echocardiogram 06/08/2014: - Left ventricle cavity is normal in size. Moderate concentric hypertrophy of the left ventricle. Normal global wall motion. Visual EF is 60-65%. Calculated EF 56%. - Trileaflet aortic valve with no regurgitation noted. Mild calcification of the aortic valve annulus. - Mild mitral regurgitation. Mild calcification of the mitral valve annulus. - Mild tricuspid regurgitation. No evidence of pulmonary hypertension.  Coronary angiogram 10/30/2015: Normal LVEF, 55%. Mid LAD occluded after D1. LIMA to LAD, SVG to D1 patent. Circumflex occluded in the midsegment after OM1. SVG to OM2 patent, sequential branch to his to circumflex occluded. Distal circumflex filled by collaterals. Mid RCA 90%, distal RCA 90%. SVG to RCA patent.  Lexiscan sestamibi stress test 10/12/2015: 1. The resting electrocardiogram demonstrated normal sinus rhythm, normal resting conduction, no resting arrhythmias and T changes. Stress EKG is non-diagnostic for ischemia as it a pharmacologic stress using Lexiscan. Stress test converted to Lexiscan due to exerise intolerence and dyspnea. 2. The perfusion imaging study demonstrates a medium size severe ischemia in the inferior and inferolateral wall extending from the base towards the apex. Left ventricular systolic function was normal with ejection fraction of 69%. This is a high risk study, consider further cardiac work-up.  Carotid artery duplex 07/07/2018: Stenosis in the bilateral internal carotid artery (16-49%). with heteregenous plaque. Antegrade vertebral artery flow. No significant change from 07/16/2017. Follow up in one year is appropriate if clinically indicated.  Lexiscan Tetrofosmin Stress Test  07/20/2019: Nondiagnostic ECG stress. There is a small sized mild reversible mild defect in the lateral region.  Compared to the study done on 10/12/2015, large inferolateral ischemia not present. Defect is consistent with known occluded  distal circumflex coronary artery. Stress LV EF: 62%.  Intermediate risk study.   ABI 10/11/2019: Right: Resting right ankle-brachial index indicates noncompressible right  lower extremity arteries. The right toe-brachial index is abnormal. RT  great toe pressure = 83 mmHg.  Left: Resting left ankle-brachial index is within normal range. The left  toe-brachial index is abnormal. LT Great toe pressure = 70 mmHg.   Echocardiogram 09/25/2020:   Normal LV systolic function with EF 62%. Left ventricle cavity is normal  in size. Normal global wall motion. Calculated EF 62%.  Structurally normal mitral valve. No evidence of mitral stenosis. Mild  (Grade I) mitral regurgitation.  Structurally normal tricuspid valve with trace regurgitation. No evidence  of tricuspid valve stenosis. No evidence of pulmonary hypertension.  No significant change from 06/08/2014.  Carotid artery duplex 10/25/2020:  Stenosis in the right internal carotid artery (16-49%). Stenosis in the  right external carotid artery (<50%).  Stenosis in the left internal carotid artery (16-49%).  Antegrade right vertebral artery flow. Antegrade left vertebral artery  flow.  Follow up in one year is appropriate if clinically indicated. Compared to  the study done on 12/15/2019, no significant change.    EKG   09/20/2020: Normal sinus rhythm at rate of 62 bpm, left atrial enlargement, normal axis.  Nonspecific T abnormality. No significant change from previous on 06/15/2019   Assessment:     ICD-10-CM   1. Coronary artery disease involving native coronary artery of native heart without angina pectoris  I25.10   2. Asymptomatic bilateral carotid  artery stenosis  I65.23   3. Primary hypertension  I10    Recommendations:   [name]  is a 55 y.o. with history of Type I Diabetes, rheumatoid arthritis, hyperlipidemia, and CAD S/P coronary artery bypass grafting on 04/27/2014.   Patient presents for 8-week follow-up of  cardiac testing.  Reviewed and discussed with patient regarding results of echocardiogram and carotid ultrasound, details above.  Bilateral carotid artery stenosis remains stable, will plan to repeat surveillance scan in 1 year.  Echocardiogram reveals normal LVEF with mild mitral regurgitation, unchanged compared to previous in 2015.  Patient has had no recurrence of precordial pain since last visit, and he tolerated foot surgery without issue and has recovered well.  Patient's blood pressure is well controlled.  I have congratulated patient regarding smoking cessation and provided encouragement for him to continue to wean himself off vaping.  No changes are made to his medications at this time.  Follow-up in 6 months, sooner if needed, for CAD, carotid artery stenosis, and hyperlipidemia.   Alethia Berthold, PA-C 11/16/2020, 10:07 AM Office: [phone]

## 2020-11-15 ENCOUNTER — Other Ambulatory Visit: Payer: Self-pay

## 2020-11-15 ENCOUNTER — Ambulatory Visit: Payer: 59 | Admitting: Student

## 2020-11-15 ENCOUNTER — Encounter: Payer: Self-pay | Admitting: Student

## 2020-11-15 VITALS — BP 125/70 | HR 61 | Resp 16 | Ht 68.0 in | Wt 208.0 lb

## 2020-11-15 DIAGNOSIS — I2510 Atherosclerotic heart disease of native coronary artery without angina pectoris: Secondary | ICD-10-CM

## 2020-11-15 DIAGNOSIS — I10 Essential (primary) hypertension: Secondary | ICD-10-CM

## 2020-11-15 DIAGNOSIS — I6523 Occlusion and stenosis of bilateral carotid arteries: Secondary | ICD-10-CM

## 2020-12-06 ENCOUNTER — Ambulatory Visit: Payer: 59 | Admitting: Podiatry

## 2020-12-06 ENCOUNTER — Other Ambulatory Visit: Payer: Self-pay

## 2020-12-06 DIAGNOSIS — M79676 Pain in unspecified toe(s): Secondary | ICD-10-CM

## 2020-12-06 DIAGNOSIS — I739 Peripheral vascular disease, unspecified: Secondary | ICD-10-CM

## 2020-12-06 DIAGNOSIS — B351 Tinea unguium: Secondary | ICD-10-CM

## 2020-12-06 DIAGNOSIS — E1042 Type 1 diabetes mellitus with diabetic polyneuropathy: Secondary | ICD-10-CM | POA: Diagnosis not present

## 2020-12-06 NOTE — Progress Notes (Signed)
He presents today for follow-up of his diabetes his peripheral neuropathy and peripheral vascular disease.  States that he finally healed the wound to the lateral aspect of his foot after Dr. [name] remove the internal fixation.  Objective: Vital signs are stable he is alert oriented x3.  Pulses are palpable.  There is no erythema edema cellulitis drainage or odor.  Neurologic sensorium is unchanged.  Toenails are long thick yellow dystrophic and clinically mycotic.  No open lesions or wounds.  The wound to the lateral aspect of fifth metatarsal closed on my straight line intact with decent sensation.  Assessment: Diabetic peripheral neuropathy diabetic angiopathy and elongated toenails.  Plan: Debridement of toenails 1 through 5 bilateral follow-up with him in 6 months for his diabetic check.

## 2021-02-27 ENCOUNTER — Other Ambulatory Visit: Payer: Self-pay

## 2021-02-27 MED ORDER — BISOPROLOL FUMARATE 5 MG PO TABS: 2.5000 mg | ORAL_TABLET | Freq: Every day | ORAL | 1 refills | Status: DC

## 2021-02-27 MED ORDER — BUPROPION HCL ER (SR) 150 MG PO TB12: 150.0000 mg | ORAL_TABLET | Freq: Every day | ORAL | 3 refills | Status: DC

## 2021-03-03 ENCOUNTER — Other Ambulatory Visit: Payer: Self-pay | Admitting: Cardiology

## 2021-03-18 ENCOUNTER — Other Ambulatory Visit: Payer: Self-pay

## 2021-03-18 ENCOUNTER — Telehealth: Payer: Self-pay | Admitting: Student

## 2021-03-18 MED ORDER — BUPROPION HCL ER (SR) 150 MG PO TB12: 150.0000 mg | ORAL_TABLET | Freq: Every day | ORAL | 1 refills | Status: DC

## 2021-03-18 NOTE — Telephone Encounter (Signed)
Pt requesting his Wellbutrin be refilled.   [facility] pharmacy [address]

## 2021-04-01 ENCOUNTER — Ambulatory Visit: Payer: 59 | Admitting: Student

## 2021-04-01 ENCOUNTER — Other Ambulatory Visit: Payer: Self-pay

## 2021-04-01 ENCOUNTER — Encounter: Payer: Self-pay | Admitting: Student

## 2021-04-01 VITALS — BP 120/60 | HR 73 | Temp 98.0°F | Ht 68.0 in | Wt 194.0 lb

## 2021-04-01 DIAGNOSIS — K264 Chronic or unspecified duodenal ulcer with hemorrhage: Secondary | ICD-10-CM

## 2021-04-01 DIAGNOSIS — I2510 Atherosclerotic heart disease of native coronary artery without angina pectoris: Secondary | ICD-10-CM

## 2021-04-01 NOTE — Patient Instructions (Signed)
Please ask GI provider when you can safely resume aspirin 81 mg daily from GI perspective.

## 2021-04-01 NOTE — Progress Notes (Signed)
Primary Physician:  Leanna Battles, MD  Patient ID: [name], male    DOB: [birth year], 54 y.o.   MRN: [id number]  Subjective:   Chief Complaint  Patient presents with   Hospitalization Follow-up   Coronary Artery Disease   HPI: [name]  is a 55 y.o. male  with history of Type I Diabetes, rheumatoid arthritis, hyperlipidemia, and CAD S/P coronary artery bypass grafting on 04/27/2014.    Patient presents for urgent visit with follow-up after recent hospitalization while on vacation in the [place].  Patient noticed dark stools starting 03/24/2021, however he traveled to the [place] for vacation.  When he arrived patient noticed worsening dyspnea on exertion and continues to have dark stools.  On 03/26/2021 patient had a syncopal episode after getting out of bed to use the bathroom, this was witnessed by his wife who states patient was unconscious for approximately 2 minutes.  He reports bladder incontinence.  Patient's wife called EMS who reported when they arrived patient was hypotensive.  At presentation to the hospital patient's hemoglobin was 5, he received 3 units of packed red blood cells.  Also underwent GI evaluation which revealed bleeding duodenal ulcer.  Patient's aspirin was discontinued.  He was discharged on 03/29/2021 with PPI for the next 12 weeks and recommended follow-up.  At the time of discharge patient's hemoglobin 9.7.   Since discharge and return to [facility] patient has continued to have dark stools and fatigue.  He has had no recurrent of syncope or near syncope.  Denies chest pain, dizziness.  He is presently scheduled to see Dr. Michail Sermon with [facility] gastroenterology tomorrow.   Notably patient has refrained from smoking since March 2022.   Past Medical History:  Diagnosis Date   Anginal pain ([place])    Anxiety    Arthritis    RA IN HANDS   Asthma    CAD (coronary artery disease) 04/27/2014   Chronic renal disease, stage 3, moderately  decreased glomerular filtration rate between 30-59 mL/min/1.73 square meter ([place]) 05/03/2014   COPD (chronic obstructive pulmonary disease) (HCC)    Coronary artery disease    Diabetes ([place]) 05/03/2014   Diabetes mellitus without complication (HCC)    type 1   Hyperlipidemia    Left shoulder pain    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/03/2014   Shortness of breath    Sleep apnea    mild OSA, no CPAP   Unstable angina pectoris ([place]) 04/25/2014    Past Surgical History:  Procedure Laterality Date   CARDIAC CATHETERIZATION  04/25/2014   BY DR Paskenta N/A 10/30/2015   Procedure: Left Heart Cath and Cors/Grafts Angiography;  Surgeon: Adrian Prows, MD;  Location: [place] [facility];  Service: Cardiovascular;  Laterality: N/A;   CARPAL TUNNEL RELEASE     CATARACT EXTRACTION, BILATERAL     CORONARY ARTERY BYPASS GRAFT N/A 04/27/2014   Procedure: CORONARY ARTERY BYPASS GRAFTING on pump using left internal mammary artery to LAD coronary artery, right great saphenous vein graft to diagonal coronary artery with sequential to OM1 and circumflex coronary arteries. Right greater saphenous vein graft to posterior descending coronary artery. ;  Surgeon: Grace Isaac, MD;  Location: [place];  Service: Open Heart Surgery;  Laterality: N   elbow drained Left 05/09/2019   ENDOVEIN HARVEST OF GREATER SAPHENOUS VEIN Right 04/27/2014   Procedure: ENDOVEIN HARVEST OF GREATER SAPHENOUS VEIN;  Surgeon: Grace Isaac, MD;  Location: [place];  Service: Open Heart Surgery;  Laterality: Right;   EXCISION ORAL TUMOR N/A 03/01/2018   Procedure: EXCISION ORAL TUMOR;  Surgeon: Melida Quitter, MD;  Location: [place];  Service: ENT;  Laterality: N/A;   EYE SURGERY     LASER   EYE SURGERY Bilateral    astigmatism correction    HARDWARE REMOVAL Left 10/03/2020   Procedure: LEFT FOOT REMOVAL HARDWARE, PLACE VANC POWDER;  Surgeon: Newt Minion, MD;  Location: [place];   Service: Orthopedics;  Laterality: Left;   INTRAOPERATIVE TRANSESOPHAGEAL ECHOCARDIOGRAM N/A 04/27/2014   Procedure: INTRAOPERATIVE TRANSESOPHAGEAL ECHOCARDIOGRAM;  Surgeon: Grace Isaac, MD;  Location: [place];  Service: Open Heart Surgery;  Laterality: N/A;   LEFT HEART CATHETERIZATION WITH CORONARY ANGIOGRAM N/A 04/25/2014   Procedure: LEFT HEART CATHETERIZATION WITH CORONARY ANGIOGRAM;  Surgeon: Laverda Page, MD;  Location: [facility] CATH LAB;  Service: Cardiovascular;  Laterality: N/A;   MOUTH SURGERY  11/15/2017   tongue surgery    ORIF TOE FRACTURE Left 03/22/2020   Procedure: OPEN REDUCTION INTERNAL FIXATION (ORIF) Non Union 5th Metatarsal;  Surgeon: Mcarthur Rossetti, MD;  Location: [place];  Service: Orthopedics;  Laterality: Left;   Family History  Problem Relation Age of Onset   Prostate cancer Father    Heart disease Father    Stomach cancer Mother    Heart disease Brother    Asthma Daughter    Healthy Daughter     Social History   Tobacco Use   Smoking status: Every Day    Packs/day: 0.25    Years: 34.00    Pack years: 8.50    Types: Cigarettes   Smokeless tobacco: Never  Substance Use Topics   Alcohol use: Yes    Alcohol/week: 0.0 standard drinks    Comment: RARE    Marital status: Divorced   Review of Systems  Constitutional: Positive for malaise/fatigue.  Cardiovascular:  Positive for dyspnea on exertion (stable). Negative for chest pain, claudication, leg swelling, near-syncope, orthopnea, palpitations, paroxysmal nocturnal dyspnea and syncope.  Respiratory:  Negative for shortness of breath.   Hematologic/Lymphatic: Does not bruise/bleed easily.  Musculoskeletal:  Positive for arthritis, back pain and neck pain.  Gastrointestinal:  Positive for melena.  Neurological:  Negative for dizziness and weakness.  Objective:  Blood pressure 120/60, pulse 73, temperature 98 F (36.7 C), height 5' 8" (1.727 m), weight 194 lb (88 kg), SpO2 99  %. Body mass index is 29.5 kg/m.   Vitals with BMI 04/01/2021 11/15/2020 10/03/2020  Height 5' 8" 5' 8" -  Weight 194 lbs 208 lbs -  BMI 40.9 81.19 -  Systolic 147 829 -  Diastolic 60 70 -  Pulse 73 61 63    Physical Exam Vitals reviewed.  Constitutional:      Appearance: He is well-developed.  HENT:     Head: Normocephalic and atraumatic.  Cardiovascular:     Rate and Rhythm: Normal rate and regular rhythm.     Pulses: Intact distal pulses.          Carotid pulses are  on the right side with bruit and  on the left side with bruit.      Femoral pulses are 2+ on the right side and 2+ on the left side.      Popliteal pulses are 2+ on the right side and 2+ on the left side.       Dorsalis pedis pulses are 1+ on the right side and 2+ on the  left side.       Posterior tibial pulses are 1+ on the right side and 1+ on the left side.     Heart sounds: Normal heart sounds, S1 normal and S2 normal. No murmur heard.   No gallop.  Pulmonary:     Effort: Pulmonary effort is normal. No accessory muscle usage or respiratory distress.     Breath sounds: Normal breath sounds. No wheezing, rhonchi or rales.  Chest:     Chest wall: No tenderness.  Musculoskeletal:     Right lower leg: No edema.     Left lower leg: No edema.  Neurological:     Mental Status: He is alert.   Laboratory examination:    CMP Latest Ref Rng & Units 10/03/2020 03/19/2020 12/19/2019  Glucose 70 - 99 mg/dL 169(H) 228(H) 89  BUN 6 - 20 mg/dL 30(H) 10 14  Creatinine 0.61 - 1.24 mg/dL 1.52(H) 1.38(H) 1.54(H)  Sodium 135 - 145 mmol/L 135 139 138  Potassium 3.5 - 5.1 mmol/L 4.4 4.9 4.6  Chloride 98 - 111 mmol/L 104 105 105  CO2 22 - 32 mmol/L _0 Calcium 8.9 - 10.3 mg/dL 8.6(L) 8.9 9.0  Total Protein 6.1 - 8.1 g/dL - - 6.5  Total Bilirubin 0.2 - 1.2 mg/dL - - 0.3  Alkaline Phos 38 - 126 U/L - - -  AST 10 - 35 U/L - - 37(H)  ALT 9 - 46 U/L - - 40   CBC Latest Ref Rng & Units 10/03/2020 12/19/2019 09/26/2019  WBC 4.0  - 10.5 K/uL 12.9(H) 10.5 9.6  Hemoglobin 13.0 - 17.0 g/dL 11.7(L) 13.1(L) 13.7  Hematocrit 39.0 - 52.0 % 36.6(L) 39.4 41.4  Platelets 150 - 400 K/uL 275 251 270   Lipid Panel     Component Value Date/Time   CHOL 55 (L) 02/10/2019 0907   TRIG 43 02/10/2019 0907   HDL 29 (L) 02/10/2019 0907   CHOLHDL 1.9 02/10/2019 0907   LDLCALC 17 02/10/2019 0907   HEMOGLOBIN A1C Lab Results  Component Value Date   HGBA1C 7.4 (H) 04/25/2014   MPG 166 (H) 04/25/2014   TSH No results for input(s): TSH in the last 8760 hours. Allergies   Allergies  Allergen Reactions   Metoprolol Diarrhea      Medications Prior to Visit:   Outpatient Medications Prior to Visit  Medication Sig Dispense Refill   albuterol (VENTOLIN HFA) 108 (90 Base) MCG/ACT inhaler Inhale 2 puffs into the lungs every 6 (six) hours as needed for wheezing.      amoxicillin (AMOXIL) 500 MG capsule Take by mouth.     Ascorbic Acid (VITAMIN C) 1000 MG tablet Take 1,000 mg by mouth daily.     bisoprolol (ZEBETA) 5 MG tablet Take 0.5 tablets (2.5 mg total) by mouth daily. 45 tablet 1   buPROPion (WELLBUTRIN SR) 150 MG 12 hr tablet Take 1 tablet (150 mg total) by mouth daily. 90 tablet 1   Cholecalciferol (VITAMIN D3) 125 MCG (5000 UT) TABS Take 5,000 Units by mouth daily.     COD LIVER OIL PO Take 415 mg by mouth daily.     Continuous Blood Gluc Sensor (FREESTYLE LIBRE 14 DAY SENSOR) MISC CHANGE EVERY 14 DAYS TO MONITOR BLOOD GLUCOSE     Continuous Blood Gluc Sensor (FREESTYLE LIBRE 14 DAY SENSOR) MISC CHANGE EVERY 14 DAYS TO MONITOR BLOOD GLUOSE     esomeprazole (NEXIUM) 20 MG capsule Take 20 mg by mouth 2 (two) times daily  before a meal.     gabapentin (NEURONTIN) 300 MG capsule Take 1 capsule (300 mg total) by mouth at bedtime. 30 capsule 2   HUMALOG 100 UNIT/ML injection Inject 15-20 Units into the skin 3 (three) times daily with meals. Sliding scale  Depending on carb intake     insulin glargine (LANTUS) 100 UNIT/ML injection  Inject 14-17 Units into the skin See admin instructions. Inject into the skin 17 units in the morning and 14 unit at bedtime     Lidocaine 4 % PTCH Place 1 patch onto the skin at bedtime.     nitroGLYCERIN (NITROSTAT) 0.4 MG SL tablet DISSOLVE 1 TABLET UNDER THE TONGUE EVERY 5 MINUTES AS  NEEDED FOR CHEST PAIN. MAX  OF 3 TABLETS IN 15 MINUTES. CALL 911 IF PAIN PERSISTS. (Patient taking differently: Place 0.4 mg under the tongue every 5 (five) minutes as needed for chest pain.) 100 tablet 3   oxymetazoline (AFRIN) 0.05 % nasal spray Place 2 sprays into both nostrils at bedtime.     pantoprazole (PROTONIX) 40 MG tablet Take 40 mg by mouth 2 (two) times daily.     tadalafil (CIALIS) 20 MG tablet Take 20 mg by mouth daily as needed for erectile dysfunction.     zinc gluconate 50 MG tablet Take 50 mg by mouth daily.     ACTEMRA ACTPEN 162 MG/0.9ML SOAJ Inject into the skin. (Patient not taking: Reported on 04/01/2021)     aspirin 81 MG tablet Take 81 mg by mouth daily. (Patient not taking: Reported on 04/01/2021)     Blood Glucose Monitoring Suppl (ONETOUCH VERIO FLEX SYSTEM) w/Device KIT USE TO CHECK BLOOD GLUCOSE UP TO 10 TIMES PER DAY     magnesium gluconate (MAGONATE) 500 MG tablet Take 500 mg by mouth daily.  (Patient not taking: Reported on 04/01/2021)     No facility-administered medications prior to visit.     Final Medications at End of Visit    Current Meds  Medication Sig   albuterol (VENTOLIN HFA) 108 (90 Base) MCG/ACT inhaler Inhale 2 puffs into the lungs every 6 (six) hours as needed for wheezing.    amoxicillin (AMOXIL) 500 MG capsule Take by mouth.   Ascorbic Acid (VITAMIN C) 1000 MG tablet Take 1,000 mg by mouth daily.   bisoprolol (ZEBETA) 5 MG tablet Take 0.5 tablets (2.5 mg total) by mouth daily.   buPROPion (WELLBUTRIN SR) 150 MG 12 hr tablet Take 1 tablet (150 mg total) by mouth daily.   Cholecalciferol (VITAMIN D3) 125 MCG (5000 UT) TABS Take 5,000 Units by mouth daily.   COD  LIVER OIL PO Take 415 mg by mouth daily.   Continuous Blood Gluc Sensor (FREESTYLE LIBRE 14 DAY SENSOR) MISC CHANGE EVERY 14 DAYS TO MONITOR BLOOD GLUCOSE   Continuous Blood Gluc Sensor (FREESTYLE LIBRE 14 DAY SENSOR) MISC CHANGE EVERY 14 DAYS TO MONITOR BLOOD GLUOSE   esomeprazole (NEXIUM) 20 MG capsule Take 20 mg by mouth 2 (two) times daily before a meal.   gabapentin (NEURONTIN) 300 MG capsule Take 1 capsule (300 mg total) by mouth at bedtime.   HUMALOG 100 UNIT/ML injection Inject 15-20 Units into the skin 3 (three) times daily with meals. Sliding scale  Depending on carb intake   insulin glargine (LANTUS) 100 UNIT/ML injection Inject 14-17 Units into the skin See admin instructions. Inject into the skin 17 units in the morning and 14 unit at bedtime   Lidocaine 4 % PTCH Place 1 patch onto the  skin at bedtime.   nitroGLYCERIN (NITROSTAT) 0.4 MG SL tablet DISSOLVE 1 TABLET UNDER THE TONGUE EVERY 5 MINUTES AS  NEEDED FOR CHEST PAIN. MAX  OF 3 TABLETS IN 15 MINUTES. CALL 911 IF PAIN PERSISTS. (Patient taking differently: Place 0.4 mg under the tongue every 5 (five) minutes as needed for chest pain.)   oxymetazoline (AFRIN) 0.05 % nasal spray Place 2 sprays into both nostrils at bedtime.   pantoprazole (PROTONIX) 40 MG tablet Take 40 mg by mouth 2 (two) times daily.   tadalafil (CIALIS) 20 MG tablet Take 20 mg by mouth daily as needed for erectile dysfunction.   zinc gluconate 50 MG tablet Take 50 mg by mouth daily.   Radiology   No results found.  Cardiac Studies:   CABG 04/27/2014: LIMA to LAD, SVG to D1, SVG to OM1 and distal circumflex, SVG to PDA. Dr. Ceasar Mons.  Echocardiogram 06/08/2014: - Left ventricle cavity is normal in size. Moderate concentric hypertrophy of the left ventricle. Normal global wall motion. Visual EF is 60-65%. Calculated EF 56%. - Trileaflet aortic valve with no regurgitation noted. Mild calcification of the aortic valve annulus. - Mild mitral regurgitation. Mild  calcification of the mitral valve annulus. - Mild tricuspid regurgitation. No evidence of pulmonary hypertension.  Coronary angiogram 10/30/2015: Normal LVEF, 55%. Mid LAD occluded after D1. LIMA to LAD, SVG to D1 patent. Circumflex occluded in the midsegment after OM1. SVG to OM2 patent, sequential branch to his to circumflex occluded. Distal circumflex filled by collaterals. Mid RCA 90%, distal RCA 90%. SVG to RCA patent.  Lexiscan sestamibi stress test 10/12/2015: 1. The resting electrocardiogram demonstrated normal sinus rhythm, normal resting conduction, no resting arrhythmias and T changes. Stress EKG is non-diagnostic for ischemia as it a pharmacologic stress using Lexiscan. Stress test converted to Lexiscan due to exerise intolerence and dyspnea. 2. The perfusion imaging study demonstrates a medium size severe ischemia in the inferior and inferolateral wall extending from the base towards the apex. Left ventricular systolic function was normal with ejection fraction of 69%. This is a high risk study, consider further cardiac work-up.  Carotid artery duplex 07/07/2018: Stenosis in the bilateral internal carotid artery (16-49%). with heteregenous plaque. Antegrade vertebral artery flow. No significant change from 07/16/2017. Follow up in one year is appropriate if clinically indicated.  Lexiscan Tetrofosmin Stress Test  07/20/2019: Nondiagnostic ECG stress. There is a small sized mild reversible mild defect in the lateral region.  Compared to the study done on 10/12/2015, large inferolateral ischemia not present. Defect is consistent with known occluded distal circumflex coronary artery. Stress LV EF: 62%.  Intermediate risk study.   ABI 10/11/2019: Right: Resting right ankle-brachial index indicates noncompressible right  lower extremity arteries. The right toe-brachial index is abnormal. RT  great toe pressure = 83 mmHg.  Left: Resting left ankle-brachial index is within normal range.  The left  toe-brachial index is abnormal. LT Great toe pressure = 70 mmHg.   Echocardiogram 09/25/2020:    Normal LV systolic function with EF 62%. Left ventricle cavity is normal  in size. Normal global wall motion. Calculated EF 62%.  Structurally normal mitral valve. No evidence of mitral stenosis. Mild  (Grade I) mitral regurgitation.  Structurally normal tricuspid valve with trace regurgitation. No evidence  of tricuspid valve stenosis. No evidence of pulmonary hypertension.  No significant change from 06/08/2014.  Carotid artery duplex 10/25/2020:  Stenosis in the right internal carotid artery (16-49%). Stenosis in the  right external carotid artery (<50%).  Stenosis in the left internal carotid artery (16-49%).  Antegrade right vertebral artery flow. Antegrade left vertebral artery  flow.  Follow up in one year is appropriate if clinically indicated. Compared to  the study done on 12/15/2019, no significant change.    EKG  04/01/2021: Sinus rhythm at a rate of 72 bpm.  Normal axis.  Nonspecific T wave abnormality.  Compared EKG 09/20/2020 no significant change. Assessment:     ICD-10-CM   1. Coronary artery disease involving native coronary artery of native heart without angina pectoris  I25.10 EKG 12-Lead    2. Gastrointestinal hemorrhage associated with duodenal ulcer  K26.4 Hemoglobin and hematocrit, blood     Recommendations:   [name]  is a 55 y.o. with history of Type I Diabetes, rheumatoid arthritis, hyperlipidemia, and CAD S/P coronary artery bypass grafting on 04/27/2014.   Patient presents for urgent visit for follow up after recent hospitalization in the [place] from 03/26/2021 - 03/09/2021 with acute GI bleed requiring 3 units of PRBC. Patient's aspirin is presently being held. Will recheck hemoglobin and hematocrit. Would recommend holding Asprin for 4 weeks and resume 81 mg daily when appropriate from GI perspective. No changes made to medications at  this time.   I personally reviewed external records from recent hospitalization.   Follow up in 6 weeks, sooner if needed, for CAD.   Patient was seen in collaboration with Dr. Einar Gip . He also reviewed patient's chart and examined the patient. Dr. Einar Gip  is in agreement of the plan.    Alethia Berthold, PA-C 04/01/2021, 4:44 PM Office: [phone]

## 2021-04-02 LAB — HEMOGLOBIN AND HEMATOCRIT, BLOOD
Hematocrit: 33.9 % — ABNORMAL LOW (ref 37.5–51.0)
Hemoglobin: 11.3 g/dL — ABNORMAL LOW (ref 13.0–17.7)

## 2021-04-02 NOTE — Progress Notes (Signed)
Can you please send this over to [facility], Dr. Bosie Clos. Patient has appt today at 11:15 AM?

## 2021-04-18 ENCOUNTER — Encounter: Payer: Self-pay | Admitting: Orthopedic Surgery

## 2021-04-18 ENCOUNTER — Ambulatory Visit: Payer: 59 | Admitting: Physician Assistant

## 2021-04-18 ENCOUNTER — Other Ambulatory Visit: Payer: Self-pay

## 2021-04-18 DIAGNOSIS — S90511A Abrasion, right ankle, initial encounter: Secondary | ICD-10-CM | POA: Diagnosis not present

## 2021-04-18 NOTE — Progress Notes (Signed)
Office Visit Note   Patient: [name]           Date of Birth: 31-Jan-1966           MRN: [id number] Visit Date: 04/18/2021              Requested by: [name], MD [address] [place],  [place] [address] PCP: [name], MD  Chief Complaint  Patient presents with   Right Foot - Wound Check      HPI: Patient is a pleasant 55 year old gentleman who is status post removal of infected hardware from his left ankle.  He presents today with a chief complaint of wound on his right foot.  He states he was on vacation in the [place] a few weeks ago.  He sustained a GI bleed and passed out.  He hit the side of the bed with the side of his foot.  When he returned to the [place] he saw his primary care provider who placed him on an antibiotic.  He has been placing a dressing over the area on the outside of his ankle.  Unfortunately he said the tape tore piece of his skin off.  He now has a small area which she says keeps bleeding and he thinks it is getting bigger.  Denies any fever or chills  Assessment & Plan: Visit Diagnoses: No diagnosis found.  Plan: Patient will follow-up in a week for recheck.  Reassurance was given.  I asked that he wear his vive compression socks 24 hours a day.  He may change to a new clean sock when he takes a shower.  He should not wear gauze beneath it.  He can finish the antibiotics he was given by Dr. Jarold Motto.  Follow-Up Instructions: No follow-ups on file.   Ortho Exam  Patient is alert, oriented, no adenopathy, well-dressed, normal affect, normal respiratory effort. Examination demonstrates right foot no swelling no cellulitis.  He has quite a bit of varicosities and Achilles contracture.  He does have a palpable dorsalis pedis pulse.  No ascending cellulitis.  On the lateral side of his foot he has a small 4 cm x 1 mm wound.  There is no surrounding cellulitis no drainage does not probe deeply.  Also has a small skin tear just above his  lateral malleolus where the tape was.  Again this is at skin surface with good healthy wound bed no ascending cellulitis or signs of infection  Imaging: No results found. No images are attached to the encounter.  Labs: Lab Results  Component Value Date   HGBA1C 7.4 (H) 04/25/2014   HGBA1C 7.4 (H) 04/25/2014   ESRSEDRATE 9 09/26/2019   ESRSEDRATE 12 09/09/2017   ESRSEDRATE 58 (H) 06/01/2014   REPTSTATUS 10/08/2020 FINAL 10/03/2020   GRAMSTAIN  10/03/2020    FEW WBC PRESENT, PREDOMINANTLY MONONUCLEAR NO ORGANISMS SEEN    CULT  10/03/2020    NO ANAEROBES ISOLATED Performed at [facility] Lab, [address] [place], [place] [address]      Lab Results  Component Value Date   ALBUMIN 3.7 03/15/2018   ALBUMIN 3.7 09/09/2017   ALBUMIN 4.2 03/30/2017    Lab Results  Component Value Date   MG 2.3 04/28/2014   MG 2.4 04/28/2014   MG 2.7 (H) 04/27/2014   Lab Results  Component Value Date   VD25OH 57 06/22/2019    No results found for: PREALBUMIN CBC EXTENDED Latest Ref Rng & Units 04/01/2021 10/03/2020 12/19/2019  WBC 4.0 - 10.5 K/uL - 12.9(H) 10.5  RBC 4.22 - 5.81 MIL/uL - 3.79(L) 4.27  HGB 13.0 - 17.7 g/dL 11.3(L) 11.7(L) 13.1(L)  HCT 37.5 - 51.0 % 33.9(L) 36.6(L) 39.4  PLT 150 - 400 K/uL - 275 251  NEUTROABS 1,500 - 7,800 cells/uL - - 5,660  LYMPHSABS 850 - 3,900 cells/uL - - 3,507     There is no height or weight on file to calculate BMI.  Orders:  No orders of the defined types were placed in this encounter.  No orders of the defined types were placed in this encounter.    Procedures: No procedures performed  Clinical Data: No additional findings.  ROS:  All other systems negative, except as noted in the HPI. Review of Systems  Objective: Vital Signs: There were no vitals taken for this visit.  Specialty Comments:  No specialty comments available.  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 03/22/2020   Osteopenia of multiple sites 02/28/2020   PVD (peripheral vascular disease) (HCC) 10/11/2019   Chronic venous insufficiency 10/05/2018   Diabetic cataract (HCC) 05/21/2018   Controlled type 1 diabetes mellitus with diabetic peripheral angiopathy without gangrene (HCC) 08/14/2017   Dyslipidemia 03/26/2017   High risk medication use 09/23/2016   History of hypertension 09/23/2016   History of chronic kidney disease 09/23/2016   History of coronary artery disease 09/23/2016   Tobacco abuse 09/23/2016   Primary osteoarthritis of both knees 09/23/2016   History of diabetes mellitus 09/23/2016   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 (HCC) 11/20/2015   Chest pain with high risk for cardiac etiology 10/29/2015   Asymptomatic bilateral carotid artery stenosis 06/08/2014   Pleural effusion, bilateral 06/03/2014   Dyspnea 06/01/2014   Diabetes (HCC) 05/03/2014   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/03/2014   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   Chronic renal disease, stage 3, moderately decreased glomerular filtration rate between 30-59 mL/min/1.73 square meter (HCC) 05/03/2014   CAD (coronary artery disease) 04/27/2014   Acne 12/19/2013   On isotretinoin therapy 12/19/2013   Nuclear sclerotic cataract, bilateral 12/19/2011   Vitreous hemorrhage (HCC) 06/16/2011   Past Medical History:  Diagnosis Date   Anginal pain (HCC)    Anxiety    Arthritis    RA IN HANDS   Asthma    CAD (coronary artery disease) 04/27/2014   Chronic renal disease, stage 3, moderately decreased glomerular filtration rate between 30-59 mL/min/1.73 square meter (HCC) 05/03/2014   COPD (chronic obstructive pulmonary disease) (HCC)    Coronary artery disease    Diabetes (HCC) 05/03/2014    Diabetes mellitus without complication (HCC)    type 1   Hyperlipidemia    Left shoulder pain    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/03/2014   Shortness of breath    Sleep apnea    mild OSA, no CPAP   Unstable angina pectoris (HCC) 04/25/2014    Family History  Problem Relation Age of Onset   Prostate cancer Father    Heart disease Father    Stomach cancer Mother    Heart disease Brother    Asthma Daughter    Healthy Daughter     Past Surgical History:  Procedure Laterality Date   CARDIAC CATHETERIZATION  04/25/2014  BY DR Jacinto Halim   CARDIAC CATHETERIZATION N/A 10/30/2015   Procedure: Left Heart Cath and Cors/Grafts Angiography;  Surgeon: Yates Decamp, MD;  Location: [facility] INVASIVE CV LAB;  Service: Cardiovascular;  Laterality: N/A;   CARPAL TUNNEL RELEASE     CATARACT EXTRACTION, BILATERAL     CORONARY ARTERY BYPASS GRAFT N/A 04/27/2014   Procedure: CORONARY ARTERY BYPASS GRAFTING on pump using left internal mammary artery to LAD coronary artery, right great saphenous vein graft to diagonal coronary artery with sequential to OM1 and circumflex coronary arteries. Right greater saphenous vein graft to posterior descending coronary artery. ;  Surgeon: Delight Ovens, MD;  Location: [facility] OR;  Service: Open Heart Surgery;  Laterality: N   elbow drained Left 05/09/2019   ENDOVEIN HARVEST OF GREATER SAPHENOUS VEIN Right 04/27/2014   Procedure: ENDOVEIN HARVEST OF GREATER SAPHENOUS VEIN;  Surgeon: Delight Ovens, MD;  Location: [facility] OR;  Service: Open Heart Surgery;  Laterality: Right;   EXCISION ORAL TUMOR N/A 03/01/2018   Procedure: EXCISION ORAL TUMOR;  Surgeon: Christia Reading, MD;  Location: [facility];  Service: ENT;  Laterality: N/A;   EYE SURGERY     LASER   EYE SURGERY Bilateral    astigmatism correction    HARDWARE REMOVAL Left 10/03/2020   Procedure: LEFT FOOT REMOVAL HARDWARE, PLACE VANC POWDER;  Surgeon: Nadara Mustard, MD;  Location: [facility] OR;  Service:  Orthopedics;  Laterality: Left;   INTRAOPERATIVE TRANSESOPHAGEAL ECHOCARDIOGRAM N/A 04/27/2014   Procedure: INTRAOPERATIVE TRANSESOPHAGEAL ECHOCARDIOGRAM;  Surgeon: Delight Ovens, MD;  Location: [facility] OR;  Service: Open Heart Surgery;  Laterality: N/A;   LEFT HEART CATHETERIZATION WITH CORONARY ANGIOGRAM N/A 04/25/2014   Procedure: LEFT HEART CATHETERIZATION WITH CORONARY ANGIOGRAM;  Surgeon: Pamella Pert, MD;  Location: [facility] CATH LAB;  Service: Cardiovascular;  Laterality: N/A;   MOUTH SURGERY  11/15/2017   tongue surgery    ORIF TOE FRACTURE Left 03/22/2020   Procedure: OPEN REDUCTION INTERNAL FIXATION (ORIF) Non Union 5th Metatarsal;  Surgeon: Kathryne Hitch, MD;  Location: [facility];  Service: Orthopedics;  Laterality: Left;   Social History   Occupational History   Not on file  Tobacco Use   Smoking status: Every Day    Packs/day: 0.25    Years: 34.00    Pack years: 8.50    Types: Cigarettes   Smokeless tobacco: Never  Vaping Use   Vaping Use: Never used  Substance and Sexual Activity   Alcohol use: Yes    Alcohol/week: 0.0 standard drinks    Comment: RARE   Drug use: No   Sexual activity: Not on file

## 2021-04-26 ENCOUNTER — Ambulatory Visit (INDEPENDENT_AMBULATORY_CARE_PROVIDER_SITE_OTHER): Payer: 59 | Admitting: Family

## 2021-04-26 ENCOUNTER — Other Ambulatory Visit: Payer: Self-pay

## 2021-04-26 ENCOUNTER — Encounter: Payer: Self-pay | Admitting: Family

## 2021-04-26 DIAGNOSIS — S91301A Unspecified open wound, right foot, initial encounter: Secondary | ICD-10-CM

## 2021-04-26 DIAGNOSIS — I872 Venous insufficiency (chronic) (peripheral): Secondary | ICD-10-CM

## 2021-04-26 NOTE — Progress Notes (Signed)
Xu (Dr. [name] is wanting to send  Office Visit Note   Patient: [name]           Date of Birth: 08/26/1965           MRN: [id number] Visit Date: 04/26/2021              Requested by: [name], MD [address] [place],  [place] [address] PCP: [name], MD  Chief Complaint  Patient presents with   Right Foot - Follow-up      HPI: Patient is a 55 year old gentleman seen today in follow-up for ulcer to his right foot he has been in medical compression stocking is pleased with resolution wound.  Assessment & Plan: Visit Diagnoses: No diagnosis found.  Plan: This appears to be healing well.  Continue with your stockings. follow-up as needed  Follow-Up Instructions: No follow-ups on file.   Ortho Exam  Patient is alert, oriented, no adenopathy, well-dressed, normal affect, normal respiratory effort. On examination of the right lower extremity the wound that he has along the lateral aspect of his foot is healing well there is dried drainage is noted.  To be any underlying depth there is no drainage no surrounding erythema no maceration well controlled edema to the right lower extremity  Imaging: No results found. No images are attached to the encounter.  Labs: Lab Results  Component Value Date   HGBA1C 7.4 (H) 04/25/2014   HGBA1C 7.4 (H) 04/25/2014   ESRSEDRATE 9 09/26/2019   ESRSEDRATE 12 09/09/2017   ESRSEDRATE 58 (H) 06/01/2014   REPTSTATUS 10/08/2020 FINAL 10/03/2020   GRAMSTAIN  10/03/2020    FEW WBC PRESENT, PREDOMINANTLY MONONUCLEAR NO ORGANISMS SEEN    CULT  10/03/2020    NO ANAEROBES ISOLATED Performed at [facility], [address], [place], [place] [address]      Lab Results  Component Value Date   ALBUMIN 3.7 03/15/2018   ALBUMIN 3.7 09/09/2017   ALBUMIN 4.2 03/30/2017    Lab Results  Component Value Date   MG 2.3 04/28/2014   MG 2.4 04/28/2014   MG 2.7 (H) 04/27/2014   Lab Results  Component Value Date    VD25OH 57 06/22/2019    No results found for: PREALBUMIN CBC EXTENDED Latest Ref Rng & Units 04/01/2021 10/03/2020 12/19/2019  WBC 4.0 - 10.5 K/uL - 12.9(H) 10.5  RBC 4.22 - 5.81 MIL/uL - 3.79(L) 4.27  HGB 13.0 - 17.7 g/dL 11.3(L) 11.7(L) 13.1(L)  HCT 37.5 - 51.0 % 33.9(L) 36.6(L) 39.4  PLT 150 - 400 K/uL - 275 251  NEUTROABS 1,500 - 7,800 cells/uL - - 5,660  LYMPHSABS 850 - 3,900 cells/uL - - 3,507     There is no height or weight on file to calculate BMI.  Orders:  No orders of the defined types were placed in this encounter.  No orders of the defined types were placed in this encounter.    Procedures: No procedures performed  Clinical Data: No additional findings.  ROS:  All other systems negative, except as noted in the HPI. Review of Systems  Objective: Vital Signs: There were no vitals taken for this visit.  Specialty Comments:  No specialty comments available.  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 03/22/2020   Osteopenia of multiple sites 02/28/2020   PVD (peripheral  vascular disease) (HCC) 10/11/2019   Chronic venous insufficiency 10/05/2018   Diabetic cataract (HCC) 05/21/2018   Controlled type 1 diabetes mellitus with diabetic peripheral angiopathy without gangrene (HCC) 08/14/2017   Dyslipidemia 03/26/2017   High risk medication use 09/23/2016   History of hypertension 09/23/2016   History of chronic kidney disease 09/23/2016   History of coronary artery disease 09/23/2016   Tobacco abuse 09/23/2016   Primary osteoarthritis of both knees 09/23/2016   History of diabetes mellitus 09/23/2016   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 (HCC) 11/20/2015   Chest pain with high risk for cardiac  etiology 10/29/2015   Asymptomatic bilateral carotid artery stenosis 06/08/2014   Pleural effusion, bilateral 06/03/2014   Dyspnea 06/01/2014   Diabetes (HCC) 05/03/2014   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/03/2014   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   Chronic renal disease, stage 3, moderately decreased glomerular filtration rate between 30-59 mL/min/1.73 square meter (HCC) 05/03/2014   CAD (coronary artery disease) 04/27/2014   Acne 12/19/2013   On isotretinoin therapy 12/19/2013   Nuclear sclerotic cataract, bilateral 12/19/2011   Vitreous hemorrhage (HCC) 06/16/2011   Past Medical History:  Diagnosis Date   Anginal pain (HCC)    Anxiety    Arthritis    RA IN HANDS   Asthma    CAD (coronary artery disease) 04/27/2014   Chronic renal disease, stage 3, moderately decreased glomerular filtration rate between 30-59 mL/min/1.73 square meter (HCC) 05/03/2014   COPD (chronic obstructive pulmonary disease) (HCC)    Coronary artery disease    Diabetes (HCC) 05/03/2014   Diabetes mellitus without complication (HCC)    type 1   Hyperlipidemia    Left shoulder pain    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/03/2014   Shortness of breath    Sleep apnea    mild OSA, no CPAP   Unstable angina pectoris (HCC) 04/25/2014    Family History  Problem Relation Age of Onset   Prostate cancer Father    Heart disease Father    Stomach cancer Mother    Heart disease Brother    Asthma Daughter    Healthy Daughter     Past Surgical History:  Procedure Laterality Date   CARDIAC CATHETERIZATION  04/25/2014   BY DR Jacinto Halim   CARDIAC CATHETERIZATION N/A 10/30/2015   Procedure: Left Heart Cath and Cors/Grafts Angiography;  Surgeon: Yates Decamp, MD;  Location: [facility] INVASIVE CV LAB;  Service: Cardiovascular;  Laterality: N/A;   CARPAL TUNNEL RELEASE     CATARACT EXTRACTION, BILATERAL     CORONARY ARTERY BYPASS GRAFT N/A 04/27/2014   Procedure: CORONARY ARTERY BYPASS GRAFTING on  pump using left internal mammary artery to LAD coronary artery, right great saphenous vein graft to diagonal coronary artery with sequential to OM1 and circumflex coronary arteries. Right greater saphenous vein graft to posterior descending coronary artery. ;  Surgeon: Delight Ovens, MD;  Location: [facility] OR;  Service: Open Heart Surgery;  Laterality: N   elbow drained Left 05/09/2019   ENDOVEIN HARVEST OF GREATER SAPHENOUS VEIN Right 04/27/2014   Procedure: ENDOVEIN HARVEST OF GREATER SAPHENOUS VEIN;  Surgeon: Delight Ovens, MD;  Location: [facility] OR;  Service: Open Heart Surgery;  Laterality: Right;   EXCISION ORAL TUMOR N/A 03/01/2018   Procedure: EXCISION ORAL TUMOR;  Surgeon: Christia Reading, MD;  Location: [facility];  Service: ENT;  Laterality: N/A;   EYE SURGERY  LASER   EYE SURGERY Bilateral    astigmatism correction    HARDWARE REMOVAL Left 10/03/2020   Procedure: LEFT FOOT REMOVAL HARDWARE, PLACE VANC POWDER;  Surgeon: Nadara Mustard, MD;  Location: [facility] OR;  Service: Orthopedics;  Laterality: Left;   INTRAOPERATIVE TRANSESOPHAGEAL ECHOCARDIOGRAM N/A 04/27/2014   Procedure: INTRAOPERATIVE TRANSESOPHAGEAL ECHOCARDIOGRAM;  Surgeon: Delight Ovens, MD;  Location: [facility] OR;  Service: Open Heart Surgery;  Laterality: N/A;   LEFT HEART CATHETERIZATION WITH CORONARY ANGIOGRAM N/A 04/25/2014   Procedure: LEFT HEART CATHETERIZATION WITH CORONARY ANGIOGRAM;  Surgeon: Pamella Pert, MD;  Location: [facility] CATH LAB;  Service: Cardiovascular;  Laterality: N/A;   MOUTH SURGERY  11/15/2017   tongue surgery    ORIF TOE FRACTURE Left 03/22/2020   Procedure: OPEN REDUCTION INTERNAL FIXATION (ORIF) Non Union 5th Metatarsal;  Surgeon: Kathryne Hitch, MD;  Location: [facility];  Service: Orthopedics;  Laterality: Left;   Social History   Occupational History   Not on file  Tobacco Use   Smoking status: Every Day    Packs/day: 0.25    Years: 34.00    Pack years: 8.50     Types: Cigarettes   Smokeless tobacco: Never  Vaping Use   Vaping Use: Never used  Substance and Sexual Activity   Alcohol use: Yes    Alcohol/week: 0.0 standard drinks    Comment: RARE   Drug use: No   Sexual activity: Not on file

## 2021-05-16 NOTE — Progress Notes (Deleted)
Primary Physician:  Leanna Battles, MD  Patient ID: [name], male    DOB: 1966-05-20, 55 y.o.   MRN: [id number]  Subjective:   No chief complaint on file.  HPI: [name]  is a 55 y.o. male  with history of Type I Diabetes, rheumatoid arthritis, hyperlipidemia, and CAD S/P coronary artery bypass grafting on 04/27/2014.  Patient with history of GI bleed 03/2021, at which time aspirin was held.  Patient presents for 6-week follow-up of CAD.  At last office visit he was advised to consult with GI regarding safe reinitiation of aspirin 81 mg daily. ***  ***ASA?  Patient presents for urgent visit with follow-up after recent hospitalization while on vacation in the [place].  Patient noticed dark stools starting 03/24/2021, however he traveled to the [place] for vacation.  When he arrived patient noticed worsening dyspnea on exertion and continues to have dark stools.  On 03/26/2021 patient had a syncopal episode after getting out of bed to use the bathroom, this was witnessed by his wife who states patient was unconscious for approximately 2 minutes.  He reports bladder incontinence.  Patient's wife called EMS who reported when they arrived patient was hypotensive.  At presentation to the hospital patient's hemoglobin was 5, he received 3 units of packed red blood cells.  Also underwent GI evaluation which revealed bleeding duodenal ulcer.  Patient's aspirin was discontinued.  He was discharged on 03/29/2021 with PPI for the next 12 weeks and recommended follow-up.  At the time of discharge patient's hemoglobin 9.7.   Since discharge and return to [facility] patient has continued to have dark stools and fatigue.  He has had no recurrent of syncope or near syncope.  Denies chest pain, dizziness.  He is presently scheduled to see Dr. Michail Sermon with [facility] gastroenterology tomorrow.   Notably patient has refrained from smoking since March 2022.   Past Medical History:   Diagnosis Date   Anginal pain ([place])    Anxiety    Arthritis    RA IN HANDS   Asthma    CAD (coronary artery disease) 04/27/2014   Chronic renal disease, stage 3, moderately decreased glomerular filtration rate between 30-59 mL/min/1.73 square meter ([place]) 05/03/2014   COPD (chronic obstructive pulmonary disease) (HCC)    Coronary artery disease    Diabetes ([place]) 05/03/2014   Diabetes mellitus without complication (HCC)    type 1   Hyperlipidemia    Left shoulder pain    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/03/2014   Shortness of breath    Sleep apnea    mild OSA, no CPAP   Unstable angina pectoris ([place]) 04/25/2014    Past Surgical History:  Procedure Laterality Date   CARDIAC CATHETERIZATION  04/25/2014   BY DR Conetoe N/A 10/30/2015   Procedure: Left Heart Cath and Cors/Grafts Angiography;  Surgeon: Adrian Prows, MD;  Location: [place] [facility];  Service: Cardiovascular;  Laterality: N/A;   CARPAL TUNNEL RELEASE     CATARACT EXTRACTION, BILATERAL     CORONARY ARTERY BYPASS GRAFT N/A 04/27/2014   Procedure: CORONARY ARTERY BYPASS GRAFTING on pump using left internal mammary artery to LAD coronary artery, right great saphenous vein graft to diagonal coronary artery with sequential to OM1 and circumflex coronary arteries. Right greater saphenous vein graft to posterior descending coronary artery. ;  Surgeon: Grace Isaac, MD;  Location: [place];  Service: Open Heart Surgery;  Laterality: N   elbow  drained Left 05/09/2019   ENDOVEIN HARVEST OF GREATER SAPHENOUS VEIN Right 04/27/2014   Procedure: ENDOVEIN HARVEST OF GREATER SAPHENOUS VEIN;  Surgeon: Grace Isaac, MD;  Location: [place];  Service: Open Heart Surgery;  Laterality: Right;   EXCISION ORAL TUMOR N/A 03/01/2018   Procedure: EXCISION ORAL TUMOR;  Surgeon: Melida Quitter, MD;  Location: [place];  Service: ENT;  Laterality: N/A;   EYE SURGERY     LASER   EYE SURGERY  Bilateral    astigmatism correction    HARDWARE REMOVAL Left 10/03/2020   Procedure: LEFT FOOT REMOVAL HARDWARE, PLACE VANC POWDER;  Surgeon: Newt Minion, MD;  Location: [place];  Service: Orthopedics;  Laterality: Left;   INTRAOPERATIVE TRANSESOPHAGEAL ECHOCARDIOGRAM N/A 04/27/2014   Procedure: INTRAOPERATIVE TRANSESOPHAGEAL ECHOCARDIOGRAM;  Surgeon: Grace Isaac, MD;  Location: [place];  Service: Open Heart Surgery;  Laterality: N/A;   LEFT HEART CATHETERIZATION WITH CORONARY ANGIOGRAM N/A 04/25/2014   Procedure: LEFT HEART CATHETERIZATION WITH CORONARY ANGIOGRAM;  Surgeon: Laverda Page, MD;  [facility];  Service: Cardiovascular;  Laterality: N/A;   MOUTH SURGERY  11/15/2017   tongue surgery    ORIF TOE FRACTURE Left 03/22/2020   Procedure: OPEN REDUCTION INTERNAL FIXATION (ORIF) Non [place] 5th Metatarsal;  Surgeon: Mcarthur Rossetti, MD;  Location: [place];  Service: Orthopedics;  Laterality: Left;   Family History  Problem Relation Age of Onset   Prostate cancer Father    Heart disease Father    Stomach cancer Mother    Heart disease Brother    Asthma Daughter    Healthy Daughter     Social History   Tobacco Use   Smoking status: Every Day    Packs/day: 0.25    Years: 34.00    Pack years: 8.50    Types: Cigarettes   Smokeless tobacco: Never  Substance Use Topics   Alcohol use: Yes    Alcohol/week: 0.0 standard drinks    Comment: RARE    Marital status: Divorced   Review of Systems  Constitutional: Positive for malaise/fatigue.  Cardiovascular:  Positive for dyspnea on exertion (stable). Negative for chest pain, claudication, leg swelling, near-syncope, orthopnea, palpitations, paroxysmal nocturnal dyspnea and syncope.  Respiratory:  Negative for shortness of breath.   Hematologic/Lymphatic: Does not bruise/bleed easily.  Musculoskeletal:  Positive for arthritis, back pain and neck pain.  Gastrointestinal:  Positive for melena.   Neurological:  Negative for dizziness and weakness.  Objective:  There were no vitals taken for this visit. There is no height or weight on file to calculate BMI.   Vitals with BMI 04/01/2021 11/15/2020 10/03/2020  Height 5' 8"  5' 8"  -  Weight 194 lbs 208 lbs -  BMI 49.6 75.91 -  Systolic 638 466 -  Diastolic 60 70 -  Pulse 73 61 63     Physical Exam Vitals reviewed.  Constitutional:      Appearance: He is well-developed.  HENT:     Head: Normocephalic and atraumatic.  Cardiovascular:     Rate and Rhythm: Normal rate and regular rhythm.     Pulses: Intact distal pulses.          Carotid pulses are  on the right side with bruit and  on the left side with bruit.      Femoral pulses are 2+ on the right side and 2+ on the left side.      Popliteal pulses are 2+ on the right side and  2+ on the left side.       Dorsalis pedis pulses are 1+ on the right side and 2+ on the left side.       Posterior tibial pulses are 1+ on the right side and 1+ on the left side.     Heart sounds: Normal heart sounds, S1 normal and S2 normal. No murmur heard.   No gallop.  Pulmonary:     Effort: Pulmonary effort is normal. No accessory muscle usage or respiratory distress.     Breath sounds: Normal breath sounds. No wheezing, rhonchi or rales.  Chest:     Chest wall: No tenderness.  Musculoskeletal:     Right lower leg: No edema.     Left lower leg: No edema.  Neurological:     Mental Status: He is alert.   Laboratory examination:    CMP Latest Ref Rng & Units 10/03/2020 03/19/2020 12/19/2019  Glucose 70 - 99 mg/dL 169(H) 228(H) 89  BUN 6 - 20 mg/dL 30(H) 10 14  Creatinine 0.61 - 1.24 mg/dL 1.52(H) 1.38(H) 1.54(H)  Sodium 135 - 145 mmol/L 135 139 138  Potassium 3.5 - 5.1 mmol/L 4.4 4.9 4.6  Chloride 98 - 111 mmol/L 104 105 105  CO2 22 - 32 mmol/L 23 26 25   Calcium 8.9 - 10.3 mg/dL 8.6(L) 8.9 9.0  Total Protein 6.1 - 8.1 g/dL - - 6.5  Total Bilirubin 0.2 - 1.2 mg/dL - - 0.3  Alkaline Phos 38 -  126 U/L - - -  AST 10 - 35 U/L - - 37(H)  ALT 9 - 46 U/L - - 40   CBC Latest Ref Rng & Units 04/01/2021 10/03/2020 12/19/2019  WBC 4.0 - 10.5 K/uL - 12.9(H) 10.5  Hemoglobin 13.0 - 17.7 g/dL 11.3(L) 11.7(L) 13.1(L)  Hematocrit 37.5 - 51.0 % 33.9(L) 36.6(L) 39.4  Platelets 150 - 400 K/uL - 275 251   Lipid Panel     Component Value Date/Time   CHOL 55 (L) 02/10/2019 0907   TRIG 43 02/10/2019 0907   HDL 29 (L) 02/10/2019 0907   CHOLHDL 1.9 02/10/2019 0907   LDLCALC 17 02/10/2019 0907   HEMOGLOBIN A1C Lab Results  Component Value Date   HGBA1C 7.4 (H) 04/25/2014   MPG 166 (H) 04/25/2014   TSH No results for input(s): TSH in the last 8760 hours. Allergies   Allergies  Allergen Reactions   Metoprolol Diarrhea      Medications Prior to Visit:   Outpatient Medications Prior to Visit  Medication Sig Dispense Refill   ACTEMRA ACTPEN 162 MG/0.9ML SOAJ Inject into the skin. (Patient not taking: Reported on 04/01/2021)     albuterol (VENTOLIN HFA) 108 (90 Base) MCG/ACT inhaler Inhale 2 puffs into the lungs every 6 (six) hours as needed for wheezing.      amoxicillin (AMOXIL) 500 MG capsule Take by mouth.     Ascorbic Acid (VITAMIN C) 1000 MG tablet Take 1,000 mg by mouth daily.     aspirin 81 MG tablet Take 81 mg by mouth daily. (Patient not taking: Reported on 04/01/2021)     bisoprolol (ZEBETA) 5 MG tablet Take 0.5 tablets (2.5 mg total) by mouth daily. 45 tablet 1   Blood Glucose Monitoring Suppl (ONETOUCH VERIO FLEX SYSTEM) w/Device KIT USE TO CHECK BLOOD GLUCOSE UP TO 10 TIMES PER DAY     buPROPion (WELLBUTRIN SR) 150 MG 12 hr tablet Take 1 tablet (150 mg total) by mouth daily. 90 tablet 1   Cholecalciferol (VITAMIN  D3) 125 MCG (5000 UT) TABS Take 5,000 Units by mouth daily.     COD LIVER OIL PO Take 415 mg by mouth daily.     Continuous Blood Gluc Sensor (FREESTYLE LIBRE 14 DAY SENSOR) MISC CHANGE EVERY 14 DAYS TO MONITOR BLOOD GLUCOSE     Continuous Blood Gluc Sensor (FREESTYLE  LIBRE 14 DAY SENSOR) MISC CHANGE EVERY 14 DAYS TO MONITOR BLOOD GLUOSE     esomeprazole (NEXIUM) 20 MG capsule Take 20 mg by mouth 2 (two) times daily before a meal.     gabapentin (NEURONTIN) 300 MG capsule Take 1 capsule (300 mg total) by mouth at bedtime. 30 capsule 2   HUMALOG 100 UNIT/ML injection Inject 15-20 Units into the skin 3 (three) times daily with meals. Sliding scale  Depending on carb intake     insulin glargine (LANTUS) 100 UNIT/ML injection Inject 14-17 Units into the skin See admin instructions. Inject into the skin 17 units in the morning and 14 unit at bedtime     Lidocaine 4 % PTCH Place 1 patch onto the skin at bedtime.     magnesium gluconate (MAGONATE) 500 MG tablet Take 500 mg by mouth daily.  (Patient not taking: Reported on 04/01/2021)     nitroGLYCERIN (NITROSTAT) 0.4 MG SL tablet DISSOLVE 1 TABLET UNDER THE TONGUE EVERY 5 MINUTES AS  NEEDED FOR CHEST PAIN. MAX  OF 3 TABLETS IN 15 MINUTES. CALL 911 IF PAIN PERSISTS. (Patient taking differently: Place 0.4 mg under the tongue every 5 (five) minutes as needed for chest pain.) 100 tablet 3   oxymetazoline (AFRIN) 0.05 % nasal spray Place 2 sprays into both nostrils at bedtime.     pantoprazole (PROTONIX) 40 MG tablet Take 40 mg by mouth 2 (two) times daily.     tadalafil (CIALIS) 20 MG tablet Take 20 mg by mouth daily as needed for erectile dysfunction.     zinc gluconate 50 MG tablet Take 50 mg by mouth daily.     No facility-administered medications prior to visit.     Final Medications at End of Visit    No outpatient medications have been marked as taking for the 05/17/21 encounter (Appointment) with Rayetta Pigg, Alyson Ki C, PA-C.   Radiology   No results found.  Cardiac Studies:   CABG 04/27/2014: LIMA to LAD, SVG to D1, SVG to OM1 and distal circumflex, SVG to PDA. Dr. Ceasar Mons.  Echocardiogram 06/08/2014: - Left ventricle cavity is normal in size. Moderate concentric hypertrophy of the left ventricle. Normal  global wall motion. Visual EF is 60-65%. Calculated EF 56%. - Trileaflet aortic valve with no regurgitation noted. Mild calcification of the aortic valve annulus. - Mild mitral regurgitation. Mild calcification of the mitral valve annulus. - Mild tricuspid regurgitation. No evidence of pulmonary hypertension.  Coronary angiogram 10/30/2015: Normal LVEF, 55%. Mid LAD occluded after D1. LIMA to LAD, SVG to D1 patent. Circumflex occluded in the midsegment after OM1. SVG to OM2 patent, sequential branch to his to circumflex occluded. Distal circumflex filled by collaterals. Mid RCA 90%, distal RCA 90%. SVG to RCA patent.  Lexiscan sestamibi stress test 10/12/2015: 1. The resting electrocardiogram demonstrated normal sinus rhythm, normal resting conduction, no resting arrhythmias and T changes. Stress EKG is non-diagnostic for ischemia as it a pharmacologic stress using Lexiscan. Stress test converted to Lexiscan due to exerise intolerence and dyspnea. 2. The perfusion imaging study demonstrates a medium size severe ischemia in the inferior and inferolateral wall extending from the base towards the  apex. Left ventricular systolic function was normal with ejection fraction of 69%. This is a high risk study, consider further cardiac work-up.  Carotid artery duplex 07/07/2018: Stenosis in the bilateral internal carotid artery (16-49%). with heteregenous plaque. Antegrade vertebral artery flow. No significant change from 07/16/2017. Follow up in one year is appropriate if clinically indicated.  Lexiscan Tetrofosmin Stress Test  07/20/2019: Nondiagnostic ECG stress. There is a small sized mild reversible mild defect in the lateral region.  Compared to the study done on 10/12/2015, large inferolateral ischemia not present. Defect is consistent with known occluded distal circumflex coronary artery. Stress LV EF: 62%.  Intermediate risk study.   ABI 10/11/2019: Right: Resting right ankle-brachial index  indicates noncompressible right  lower extremity arteries. The right toe-brachial index is abnormal. RT  great toe pressure = 83 mmHg.  Left: Resting left ankle-brachial index is within normal range. The left  toe-brachial index is abnormal. LT Great toe pressure = 70 mmHg.   Echocardiogram 09/25/2020:    Normal LV systolic function with EF 62%. Left ventricle cavity is normal  in size. Normal global wall motion. Calculated EF 62%.  Structurally normal mitral valve. No evidence of mitral stenosis. Mild  (Grade I) mitral regurgitation.  Structurally normal tricuspid valve with trace regurgitation. No evidence  of tricuspid valve stenosis. No evidence of pulmonary hypertension.  No significant change from 06/08/2014.  Carotid artery duplex 10/25/2020:  Stenosis in the right internal carotid artery (16-49%). Stenosis in the  right external carotid artery (<50%).  Stenosis in the left internal carotid artery (16-49%).  Antegrade right vertebral artery flow. Antegrade left vertebral artery  flow.  Follow up in one year is appropriate if clinically indicated. Compared to  the study done on 12/15/2019, no significant change.    EKG  04/01/2021: Sinus rhythm at a rate of 72 bpm.  Normal axis.  Nonspecific T wave abnormality.  Compared EKG 09/20/2020 no significant change.  Assessment:   No diagnosis found.  Recommendations:   [name]  is a 55 y.o. with history of Type I Diabetes, rheumatoid arthritis, hyperlipidemia, and CAD S/P coronary artery bypass grafting on 04/27/2014.  Patient with history of GI bleed 03/2021, at which time aspirin was held.  Patient presents for 6-week follow-up of CAD.  At last office visit he was advised to consult with GI regarding safe reinitiation of aspirin 81 mg daily. ***  ***  Patient presents for urgent visit for follow up after recent hospitalization in the [place] from 03/26/2021 - 03/09/2021 with acute GI bleed requiring 3 units of PRBC.  Patient's aspirin is presently being held. Will recheck hemoglobin and hematocrit. Would recommend holding Asprin for 4 weeks and resume 81 mg daily when appropriate from GI perspective. No changes made to medications at this time.   I personally reviewed external records from recent hospitalization.   Follow up in 6 weeks, sooner if needed, for CAD.   Patient was seen in collaboration with Dr. Einar Gip . He also reviewed patient's chart and examined the patient. Dr. Einar Gip  is in agreement of the plan.    Alethia Berthold, PA-C 05/16/2021, 4:36 PM Office: [phone]

## 2021-05-17 ENCOUNTER — Ambulatory Visit: Payer: 59 | Admitting: Student

## 2021-05-17 DIAGNOSIS — K264 Chronic or unspecified duodenal ulcer with hemorrhage: Secondary | ICD-10-CM

## 2021-05-17 DIAGNOSIS — I2510 Atherosclerotic heart disease of native coronary artery without angina pectoris: Secondary | ICD-10-CM

## 2021-05-24 NOTE — Progress Notes (Signed)
Primary Physician:  Leanna Battles, MD  Patient ID: [name], male    DOB: 1965-09-29, 55 y.o.   MRN: [id number]  Subjective:   Chief Complaint  Patient presents with   Coronary Artery Disease   Follow-up   Hyperlipidemia   HPI: [name]  is a 55 y.o. male  with history of Type I Diabetes, rheumatoid arthritis, hyperlipidemia, and CAD S/P coronary artery bypass grafting on 04/27/2014.  Patient with history of GI bleed 03/2021, at which time aspirin was held.  Patient presents for 6-week follow-up of CAD.  At last office visit he was advised to consult with GI regarding safe reinitiation of aspirin 81 mg daily.  Patient has been following closely with GI and is currently scheduled for endoscopy next month.  GI is recommended that patient hold aspirin until after upcoming endoscopy.  Patient is otherwise feeling relatively well from a cardiovascular standpoint.  Denies chest pain, palpitations, syncope, near syncope, dizziness.  He reports home blood pressure readings averaging 133/70s mmHg.  Patient's blood pressure is elevated in the office today, typically it is well controlled.  He does admit to high sodium intake yesterday.  He sees Dr. Michail Sermon with [facility] gastroenterology.   Notably patient has refrained from smoking since March 2022.   Past Medical History:  Diagnosis Date   Anginal pain ([place])    Anxiety    Arthritis    RA IN HANDS   Asthma    CAD (coronary artery disease) 04/27/2014   Chronic renal disease, stage 3, moderately decreased glomerular filtration rate between 30-59 mL/min/1.73 square meter ([place]) 05/03/2014   COPD (chronic obstructive pulmonary disease) (HCC)    Coronary artery disease    Diabetes ([place]) 05/03/2014   Diabetes mellitus without complication (HCC)    type 1   Hyperlipidemia    Left shoulder pain    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/03/2014   Shortness of breath    Sleep apnea    mild OSA, no CPAP   Unstable  angina pectoris ([place]) 04/25/2014    Past Surgical History:  Procedure Laterality Date   CARDIAC CATHETERIZATION  04/25/2014   BY DR Tat Momoli N/A 10/30/2015   Procedure: Left Heart Cath and Cors/Grafts Angiography;  Surgeon: Adrian Prows, MD;  Location: [place] [facility];  Service: Cardiovascular;  Laterality: N/A;   CARPAL TUNNEL RELEASE     CATARACT EXTRACTION, BILATERAL     CORONARY ARTERY BYPASS GRAFT N/A 04/27/2014   Procedure: CORONARY ARTERY BYPASS GRAFTING on pump using left internal mammary artery to LAD coronary artery, right great saphenous vein graft to diagonal coronary artery with sequential to OM1 and circumflex coronary arteries. Right greater saphenous vein graft to posterior descending coronary artery. ;  Surgeon: Grace Isaac, MD;  Location: [place];  Service: Open Heart Surgery;  Laterality: N   elbow drained Left 05/09/2019   ENDOVEIN HARVEST OF GREATER SAPHENOUS VEIN Right 04/27/2014   Procedure: ENDOVEIN HARVEST OF GREATER SAPHENOUS VEIN;  Surgeon: Grace Isaac, MD;  Location: [place];  Service: Open Heart Surgery;  Laterality: Right;   EXCISION ORAL TUMOR N/A 03/01/2018   Procedure: EXCISION ORAL TUMOR;  Surgeon: Melida Quitter, MD;  Location: [place];  Service: ENT;  Laterality: N/A;   EYE SURGERY     LASER   EYE SURGERY Bilateral    astigmatism correction    HARDWARE REMOVAL Left 10/03/2020   Procedure: LEFT FOOT REMOVAL HARDWARE, PLACE VANC  POWDER;  Surgeon: Newt Minion, MD;  Location: [place];  Service: Orthopedics;  Laterality: Left;   INTRAOPERATIVE TRANSESOPHAGEAL ECHOCARDIOGRAM N/A 04/27/2014   Procedure: INTRAOPERATIVE TRANSESOPHAGEAL ECHOCARDIOGRAM;  Surgeon: Grace Isaac, MD;  Location: [place];  Service: Open Heart Surgery;  Laterality: N/A;   LEFT HEART CATHETERIZATION WITH CORONARY ANGIOGRAM N/A 04/25/2014   Procedure: LEFT HEART CATHETERIZATION WITH CORONARY ANGIOGRAM;  Surgeon: Laverda Page, MD;  Location: [facility]  CATH LAB;  Service: Cardiovascular;  Laterality: N/A;   MOUTH SURGERY  11/15/2017   tongue surgery    ORIF TOE FRACTURE Left 03/22/2020   Procedure: OPEN REDUCTION INTERNAL FIXATION (ORIF) Non Union 5th Metatarsal;  Surgeon: Mcarthur Rossetti, MD;  Location: [place];  Service: Orthopedics;  Laterality: Left;   Family History  Problem Relation Age of Onset   Prostate cancer Father    Heart disease Father    Stomach cancer Mother    Heart disease Brother    Asthma Daughter    Healthy Daughter     Social History   Tobacco Use   Smoking status: Every Day    Packs/day: 0.25    Years: 34.00    Pack years: 8.50    Types: Cigarettes   Smokeless tobacco: Never  Substance Use Topics   Alcohol use: Yes    Alcohol/week: 0.0 standard drinks    Comment: RARE    Marital status: Divorced   Review of Systems  Constitutional: Positive for malaise/fatigue (improving).  Cardiovascular:  Positive for dyspnea on exertion (stable). Negative for chest pain, claudication, leg swelling, near-syncope, orthopnea, palpitations, paroxysmal nocturnal dyspnea and syncope.  Respiratory:  Negative for shortness of breath.   Hematologic/Lymphatic: Does not bruise/bleed easily.  Musculoskeletal:  Positive for arthritis, back pain and neck pain.  Neurological:  Negative for dizziness and weakness.  Objective:  Blood pressure 120/61, pulse 64, temperature 98.1 F (36.7 C), temperature source Temporal, resp. rate (!) 57, height 5' 8"  (1.727 m), weight 200 lb (90.7 kg), SpO2 97 %. Body mass index is 30.41 kg/m.   Vitals with BMI 05/27/2021 05/27/2021 05/27/2021  Height - - 5' 8"   Weight - - 200 lbs  BMI - - 76.72  Systolic 094 709 628  Diastolic 61 72 75  Pulse - 64 63     Physical Exam Vitals reviewed.  Constitutional:      Appearance: He is well-developed.  HENT:     Head: Normocephalic and atraumatic.  Cardiovascular:     Rate and Rhythm: Normal rate and regular rhythm.      Pulses: Intact distal pulses.          Carotid pulses are  on the right side with bruit and  on the left side with bruit.      Femoral pulses are 2+ on the right side and 2+ on the left side.      Popliteal pulses are 2+ on the right side and 2+ on the left side.       Dorsalis pedis pulses are 1+ on the right side and 2+ on the left side.       Posterior tibial pulses are 1+ on the right side and 1+ on the left side.     Heart sounds: Normal heart sounds, S1 normal and S2 normal. No murmur heard.   No gallop.  Pulmonary:     Effort: Pulmonary effort is normal. No accessory muscle usage or respiratory distress.     Breath sounds: Normal breath sounds.  No wheezing, rhonchi or rales.  Chest:     Chest wall: No tenderness.  Musculoskeletal:     Right lower leg: No edema.     Left lower leg: No edema.  Neurological:     Mental Status: He is alert.  Physical exam unchanged compared to previous.  Laboratory examination:   CMP Latest Ref Rng & Units 10/03/2020 03/19/2020 12/19/2019  Glucose 70 - 99 mg/dL 169(H) 228(H) 89  BUN 6 - 20 mg/dL 30(H) 10 14  Creatinine 0.61 - 1.24 mg/dL 1.52(H) 1.38(H) 1.54(H)  Sodium 135 - 145 mmol/L 135 139 138  Potassium 3.5 - 5.1 mmol/L 4.4 4.9 4.6  Chloride 98 - 111 mmol/L 104 105 105  CO2 22 - 32 mmol/L 23 26 25   Calcium 8.9 - 10.3 mg/dL 8.6(L) 8.9 9.0  Total Protein 6.1 - 8.1 g/dL - - 6.5  Total Bilirubin 0.2 - 1.2 mg/dL - - 0.3  Alkaline Phos 38 - 126 U/L - - -  AST 10 - 35 U/L - - 37(H)  ALT 9 - 46 U/L - - 40   CBC Latest Ref Rng & Units 04/01/2021 10/03/2020 12/19/2019  WBC 4.0 - 10.5 K/uL - 12.9(H) 10.5  Hemoglobin 13.0 - 17.7 g/dL 11.3(L) 11.7(L) 13.1(L)  Hematocrit 37.5 - 51.0 % 33.9(L) 36.6(L) 39.4  Platelets 150 - 400 K/uL - 275 251   Lipid Panel     Component Value Date/Time   CHOL 55 (L) 02/10/2019 0907   TRIG 43 02/10/2019 0907   HDL 29 (L) 02/10/2019 0907   CHOLHDL 1.9 02/10/2019 0907   LDLCALC 17 02/10/2019 0907   HEMOGLOBIN  A1C Lab Results  Component Value Date   HGBA1C 7.4 (H) 04/25/2014   MPG 166 (H) 04/25/2014   TSH No results for input(s): TSH in the last 8760 hours.  Allergies   Allergies  Allergen Reactions   Metoprolol Diarrhea    Medications Prior to Visit:   Outpatient Medications Prior to Visit  Medication Sig Dispense Refill   albuterol (VENTOLIN HFA) 108 (90 Base) MCG/ACT inhaler Inhale 2 puffs into the lungs every 6 (six) hours as needed for wheezing.      Ascorbic Acid (VITAMIN C) 1000 MG tablet Take 1,000 mg by mouth daily.     bisoprolol (ZEBETA) 5 MG tablet Take 0.5 tablets (2.5 mg total) by mouth daily. 45 tablet 1   Blood Glucose Monitoring Suppl (ONETOUCH VERIO FLEX SYSTEM) w/Device KIT USE TO CHECK BLOOD GLUCOSE UP TO 10 TIMES PER DAY     buPROPion (WELLBUTRIN SR) 150 MG 12 hr tablet Take 1 tablet (150 mg total) by mouth daily. 90 tablet 1   Cholecalciferol (VITAMIN D3) 125 MCG (5000 UT) TABS Take 5,000 Units by mouth daily.     COD LIVER OIL PO Take 415 mg by mouth daily.     Continuous Blood Gluc Sensor (FREESTYLE LIBRE 14 DAY SENSOR) MISC CHANGE EVERY 14 DAYS TO MONITOR BLOOD GLUCOSE     esomeprazole (NEXIUM) 20 MG capsule Take 20 mg by mouth 2 (two) times daily before a meal.     gabapentin (NEURONTIN) 300 MG capsule Take 1 capsule (300 mg total) by mouth at bedtime. 30 capsule 2   HUMALOG 100 UNIT/ML injection Inject 15-20 Units into the skin 3 (three) times daily with meals. Sliding scale  Depending on carb intake     insulin glargine (LANTUS) 100 UNIT/ML injection Inject 14-17 Units into the skin See admin instructions. Inject into the skin 17 units in  the morning and 14 unit at bedtime     Lidocaine 4 % PTCH Place 1 patch onto the skin at bedtime.     magnesium gluconate (MAGONATE) 500 MG tablet Take 500 mg by mouth daily.     nitroGLYCERIN (NITROSTAT) 0.4 MG SL tablet DISSOLVE 1 TABLET UNDER THE TONGUE EVERY 5 MINUTES AS  NEEDED FOR CHEST PAIN. MAX  OF 3 TABLETS IN 15  MINUTES. CALL 911 IF PAIN PERSISTS. (Patient taking differently: Place 0.4 mg under the tongue every 5 (five) minutes as needed for chest pain.) 100 tablet 3   oxymetazoline (AFRIN) 0.05 % nasal spray Place 2 sprays into both nostrils at bedtime.     pantoprazole (PROTONIX) 40 MG tablet Take 40 mg by mouth 2 (two) times daily.     tadalafil (CIALIS) 20 MG tablet Take 20 mg by mouth daily as needed for erectile dysfunction.     zinc gluconate 50 MG tablet Take 50 mg by mouth daily.     Continuous Blood Gluc Sensor (FREESTYLE LIBRE 14 DAY SENSOR) MISC CHANGE EVERY 14 DAYS TO MONITOR BLOOD GLUOSE     ACTEMRA ACTPEN 162 MG/0.9ML SOAJ Inject into the skin. (Patient not taking: No sig reported)     amoxicillin (AMOXIL) 500 MG capsule Take by mouth.     aspirin 81 MG tablet Take 81 mg by mouth daily. (Patient not taking: Reported on 04/01/2021)     No facility-administered medications prior to visit.   Final Medications at End of Visit    Current Meds  Medication Sig   albuterol (VENTOLIN HFA) 108 (90 Base) MCG/ACT inhaler Inhale 2 puffs into the lungs every 6 (six) hours as needed for wheezing.    Ascorbic Acid (VITAMIN C) 1000 MG tablet Take 1,000 mg by mouth daily.   bisoprolol (ZEBETA) 5 MG tablet Take 0.5 tablets (2.5 mg total) by mouth daily.   Blood Glucose Monitoring Suppl (ONETOUCH VERIO FLEX SYSTEM) w/Device KIT USE TO CHECK BLOOD GLUCOSE UP TO 10 TIMES PER DAY   buPROPion (WELLBUTRIN SR) 150 MG 12 hr tablet Take 1 tablet (150 mg total) by mouth daily.   Cholecalciferol (VITAMIN D3) 125 MCG (5000 UT) TABS Take 5,000 Units by mouth daily.   COD LIVER OIL PO Take 415 mg by mouth daily.   Continuous Blood Gluc Sensor (FREESTYLE LIBRE 14 DAY SENSOR) MISC CHANGE EVERY 14 DAYS TO MONITOR BLOOD GLUCOSE   esomeprazole (NEXIUM) 20 MG capsule Take 20 mg by mouth 2 (two) times daily before a meal.   gabapentin (NEURONTIN) 300 MG capsule Take 1 capsule (300 mg total) by mouth at bedtime.   HUMALOG  100 UNIT/ML injection Inject 15-20 Units into the skin 3 (three) times daily with meals. Sliding scale  Depending on carb intake   insulin glargine (LANTUS) 100 UNIT/ML injection Inject 14-17 Units into the skin See admin instructions. Inject into the skin 17 units in the morning and 14 unit at bedtime   Lidocaine 4 % PTCH Place 1 patch onto the skin at bedtime.   magnesium gluconate (MAGONATE) 500 MG tablet Take 500 mg by mouth daily.   nitroGLYCERIN (NITROSTAT) 0.4 MG SL tablet DISSOLVE 1 TABLET UNDER THE TONGUE EVERY 5 MINUTES AS  NEEDED FOR CHEST PAIN. MAX  OF 3 TABLETS IN 15 MINUTES. CALL 911 IF PAIN PERSISTS. (Patient taking differently: Place 0.4 mg under the tongue every 5 (five) minutes as needed for chest pain.)   oxymetazoline (AFRIN) 0.05 % nasal spray Place 2 sprays into  both nostrils at bedtime.   pantoprazole (PROTONIX) 40 MG tablet Take 40 mg by mouth 2 (two) times daily.   tadalafil (CIALIS) 20 MG tablet Take 20 mg by mouth daily as needed for erectile dysfunction.   zinc gluconate 50 MG tablet Take 50 mg by mouth daily.   Radiology   No results found.  Cardiac Studies:   CABG 04/27/2014: LIMA to LAD, SVG to D1, SVG to OM1 and distal circumflex, SVG to PDA. Dr. Ceasar Mons.  Echocardiogram 06/08/2014: - Left ventricle cavity is normal in size. Moderate concentric hypertrophy of the left ventricle. Normal global wall motion. Visual EF is 60-65%. Calculated EF 56%. - Trileaflet aortic valve with no regurgitation noted. Mild calcification of the aortic valve annulus. - Mild mitral regurgitation. Mild calcification of the mitral valve annulus. - Mild tricuspid regurgitation. No evidence of pulmonary hypertension.  Coronary angiogram 10/30/2015: Normal LVEF, 55%. Mid LAD occluded after D1. LIMA to LAD, SVG to D1 patent. Circumflex occluded in the midsegment after OM1. SVG to OM2 patent, sequential branch to his to circumflex occluded. Distal circumflex filled by collaterals. Mid RCA  90%, distal RCA 90%. SVG to RCA patent.  Lexiscan sestamibi stress test 10/12/2015: 1. The resting electrocardiogram demonstrated normal sinus rhythm, normal resting conduction, no resting arrhythmias and T changes. Stress EKG is non-diagnostic for ischemia as it a pharmacologic stress using Lexiscan. Stress test converted to Lexiscan due to exerise intolerence and dyspnea. 2. The perfusion imaging study demonstrates a medium size severe ischemia in the inferior and inferolateral wall extending from the base towards the apex. Left ventricular systolic function was normal with ejection fraction of 69%. This is a high risk study, consider further cardiac work-up.  Carotid artery duplex 07/07/2018: Stenosis in the bilateral internal carotid artery (16-49%). with heteregenous plaque. Antegrade vertebral artery flow. No significant change from 07/16/2017. Follow up in one year is appropriate if clinically indicated.  Lexiscan Tetrofosmin Stress Test  07/20/2019: Nondiagnostic ECG stress. There is a small sized mild reversible mild defect in the lateral region.  Compared to the study done on 10/12/2015, large inferolateral ischemia not present. Defect is consistent with known occluded distal circumflex coronary artery. Stress LV EF: 62%.  Intermediate risk study.   ABI 10/11/2019: Right: Resting right ankle-brachial index indicates noncompressible right  lower extremity arteries. The right toe-brachial index is abnormal. RT  great toe pressure = 83 mmHg.  Left: Resting left ankle-brachial index is within normal range. The left  toe-brachial index is abnormal. LT Great toe pressure = 70 mmHg.   Echocardiogram 09/25/2020:    Normal LV systolic function with EF 62%. Left ventricle cavity is normal  in size. Normal global wall motion. Calculated EF 62%.  Structurally normal mitral valve. No evidence of mitral stenosis. Mild  (Grade I) mitral regurgitation.  Structurally normal tricuspid valve with  trace regurgitation. No evidence  of tricuspid valve stenosis. No evidence of pulmonary hypertension.  No significant change from 06/08/2014.  Carotid artery duplex 10/25/2020:  Stenosis in the right internal carotid artery (16-49%). Stenosis in the  right external carotid artery (<50%).  Stenosis in the left internal carotid artery (16-49%).  Antegrade right vertebral artery flow. Antegrade left vertebral artery  flow.  Follow up in one year is appropriate if clinically indicated. Compared to  the study done on 12/15/2019, no significant change.    EKG  04/01/2021: Sinus rhythm at a rate of 72 bpm.  Normal axis.  Nonspecific T wave abnormality.  Compared EKG 09/20/2020  no significant change.  Assessment:     ICD-10-CM   1. Coronary artery disease involving native coronary artery of native heart without angina pectoris  I25.10     2. Gastrointestinal hemorrhage associated with duodenal ulcer  K26.4     No orders of the defined types were placed in this encounter. Medications Discontinued During This Encounter  Medication Reason   amoxicillin (AMOXIL) 500 MG capsule Error   aspirin 81 MG tablet Error   ACTEMRA ACTPEN 162 MG/0.9ML SOAJ Error   Recommendations:   [name]  is a 55 y.o. with history of Type I Diabetes, rheumatoid arthritis, hyperlipidemia, and CAD S/P coronary artery bypass grafting on 04/27/2014.  Patient with history of GI bleed 03/2021, at which time aspirin was held.  Patient presents for 6-week follow-up of CAD.  At last office visit he was advised to consult with GI regarding safe reinitiation of aspirin 81 mg daily.  Patient is currently scheduled for endoscopy with GI next month, decision regarding reinitiation of aspirin will be made following this procedure.  Recommend reinitiation of aspirin when appropriate from GI perspective.  In regard to hypertension, patient's blood pressure was initially elevated in the office today.  However it improved upon  recheck and is well controlled.  Will not make changes to medications at this time.    Patient is otherwise stable from a cardiovascular standpoint.  Follow-up in 6 months, sooner if needed, for CAD and hypertension.   Alethia Berthold, PA-C 05/27/2021, 9:08 AM Office: [phone]

## 2021-05-27 ENCOUNTER — Ambulatory Visit: Payer: 59 | Admitting: Student

## 2021-05-27 ENCOUNTER — Other Ambulatory Visit: Payer: Self-pay

## 2021-05-27 ENCOUNTER — Encounter: Payer: Self-pay | Admitting: Student

## 2021-05-27 VITALS — BP 120/61 | HR 64 | Temp 98.1°F | Resp 57 | Ht 68.0 in | Wt 200.0 lb

## 2021-05-27 DIAGNOSIS — I2510 Atherosclerotic heart disease of native coronary artery without angina pectoris: Secondary | ICD-10-CM

## 2021-05-27 DIAGNOSIS — K264 Chronic or unspecified duodenal ulcer with hemorrhage: Secondary | ICD-10-CM

## 2021-06-11 ENCOUNTER — Ambulatory Visit: Payer: 59 | Admitting: Podiatry

## 2021-06-13 ENCOUNTER — Encounter: Payer: Self-pay | Admitting: Podiatry

## 2021-06-13 ENCOUNTER — Other Ambulatory Visit: Payer: Self-pay

## 2021-06-13 ENCOUNTER — Ambulatory Visit: Payer: 59 | Admitting: Podiatry

## 2021-06-13 DIAGNOSIS — M79676 Pain in unspecified toe(s): Secondary | ICD-10-CM | POA: Diagnosis not present

## 2021-06-13 DIAGNOSIS — E1042 Type 1 diabetes mellitus with diabetic polyneuropathy: Secondary | ICD-10-CM

## 2021-06-13 DIAGNOSIS — B351 Tinea unguium: Secondary | ICD-10-CM

## 2021-06-13 NOTE — Progress Notes (Signed)
He presents today for diabetic checkup and nail debridement.  Spent about 6 months since have seen him.  He is tolerated his feet pretty well.  Objective: Vital signs are stable he is alert oriented x3 pulses are palpable.  Capillary fill time is immediate neurologic sensorium is slightly diminished per Semmes Weinstein monofilament hammertoe deformities noted bilateral no open lesions or wounds are noted his toenails are long thick yellow dystrophic with mycotic painful palpation.  Assessment: Pain in limb secondary to onychomycosis diabetic peripheral neuropathy.  Plan: Debridement of toenails 1 through 5 bilaterally also get him scheduled for follow-up with [place] in 3 months to reevaluate for diabetic shoes.

## 2021-09-12 ENCOUNTER — Ambulatory Visit: Payer: 59 | Admitting: Podiatry

## 2021-10-01 ENCOUNTER — Telehealth: Payer: Self-pay

## 2021-10-01 ENCOUNTER — Other Ambulatory Visit: Payer: Self-pay

## 2021-10-01 ENCOUNTER — Encounter: Payer: Self-pay | Admitting: Podiatry

## 2021-10-01 ENCOUNTER — Ambulatory Visit: Payer: 59 | Admitting: Podiatry

## 2021-10-01 ENCOUNTER — Ambulatory Visit: Payer: 59

## 2021-10-01 DIAGNOSIS — I739 Peripheral vascular disease, unspecified: Secondary | ICD-10-CM

## 2021-10-01 DIAGNOSIS — E1042 Type 1 diabetes mellitus with diabetic polyneuropathy: Secondary | ICD-10-CM

## 2021-10-01 DIAGNOSIS — B351 Tinea unguium: Secondary | ICD-10-CM | POA: Diagnosis not present

## 2021-10-01 DIAGNOSIS — Z8631 Personal history of diabetic foot ulcer: Secondary | ICD-10-CM

## 2021-10-01 DIAGNOSIS — M2042 Other hammer toe(s) (acquired), left foot: Secondary | ICD-10-CM

## 2021-10-01 DIAGNOSIS — Z9889 Other specified postprocedural states: Secondary | ICD-10-CM

## 2021-10-01 DIAGNOSIS — M79676 Pain in unspecified toe(s): Secondary | ICD-10-CM

## 2021-10-01 DIAGNOSIS — M2041 Other hammer toe(s) (acquired), right foot: Secondary | ICD-10-CM

## 2021-10-01 NOTE — Telephone Encounter (Signed)
Casts sent to central fabrication °

## 2021-10-01 NOTE — Telephone Encounter (Signed)
Shoes Ordered - Apex [id number]

## 2021-10-01 NOTE — Progress Notes (Signed)
Presents today for follow-up of his diabetic peripheral neuropathy and his painful elongated toenails.  He relates pain that he was having to the lateral aspect of his left foot where he is already had surgery at the fifth metatarsal base.  States that it was therefore a few days and mild to moderately tender and took a couple of his significant other nonsteroidal anti-inflammatories.  States that the pain resolved almost immediately.  He states that his neuropathy is stable continuing to take his gabapentin regularly  Objective: Vital signs are stable he is alert and oriented x3 pulses are palpable and normal digital hair growth is present.  Capillary fill time is immediate neurologic sensorium grossly intact per Semmes Weinstein monofilament.  Deep tendon reflexes are intact.  Toenails are long thick yellow dystrophic clinical mycotic previous wound has healed to the lateral aspect of his right foot.  Assessment: Diabetic peripheral neuropathy pain in limb secondary to onychomycosis hammertoe deformity history of diabetic ulceration.  Plan: Debrided nails today 1 through 5 bilaterally due to hammertoe deformity history of ulceration and history of nonhealing wound diabetic peripheral neuropathy we will get him diabetic shoes made.  Also I am going to debride his nails 1 through 5 bilateral.

## 2021-10-01 NOTE — Progress Notes (Signed)
SITUATION Reason for Consult: Evaluation for Prefabricated Diabetic Shoes and Custom Diabetic Inserts. Patient / Caregiver Report: Patient would like well fitting shoes  OBJECTIVE DATA: Patient History / Diagnosis:    ICD-10-CM   1. Diabetic polyneuropathy associated with type 1 diabetes mellitus (HCC)  E10.42     2. Hammer toes of both feet  M20.41    M20.42     3. History of diabetic ulcer of foot  Z86.31     4. Status post left foot surgery  Z98.890       Current or Previous Devices:   None and no history  In-Person Foot Examination: Ulcers & [place]:   Historical  Deformities:   - Hallux valgus  - Pes Cavus - Hammertoes   Shoe Size: 9W  ORTHOTIC RECOMMENDATION Recommended Devices: - 1x pair prefabricated PDAC approved diabetic shoes; Patient Selected - Apex [id number] Size 9W - 1x pair custom-to-patient PDAC approved vacuum formed diabetic insoles.  GOALS OF SHOES AND INSOLES - Reduce shear and pressure - Reduce / Prevent callus formation - Reduce / Prevent ulceration - Protect the fragile healing compromised diabetic foot.  Patient would benefit from diabetic shoes and inserts as patient has diabetes mellitus and the patient has one or more of the following conditions: - History of previous foot ulceration. - History of pre-ulcerative callus - Peripheral neuropathy with evidence of callus formation - Foot deformity - Poor circulation  ACTIONS PERFORMED Patient was casted for insoles via crush box and measured for shoes via brannock device. Procedure was explained and patient tolerated procedure well. All questions were answered and concerns addressed.  PLAN Patient is to be contacted and scheduled for fitting once CMN is obtained from treaing physician and shoes and insoles have been fabricated and received.

## 2021-10-08 ENCOUNTER — Ambulatory Visit: Payer: Self-pay

## 2021-10-08 ENCOUNTER — Ambulatory Visit (INDEPENDENT_AMBULATORY_CARE_PROVIDER_SITE_OTHER): Payer: 59 | Admitting: Orthopedic Surgery

## 2021-10-08 DIAGNOSIS — M79672 Pain in left foot: Secondary | ICD-10-CM

## 2021-10-09 ENCOUNTER — Encounter: Payer: Self-pay | Admitting: Orthopedic Surgery

## 2021-10-09 NOTE — Progress Notes (Signed)
Office Visit Note   Patient: [name]           Date of Birth: 1965/11/01           MRN: [id number] Visit Date: 10/08/2021              Requested by: Garlan Fillers, MD [address] [place],  [place] [address] PCP: Garlan Fillers, MD  Chief Complaint  Patient presents with   Left Foot - Pain    Hx ORIF non union 5th MT fx 03/22/2020 Removal of HDW 10/03/2020      HPI: Patient is a 56 year old gentleman who is status post revision fusion base of the fifth metatarsal secondary to a Jones fracture.  Patient has had hardware removal 1 year ago.  Patient states he occasionally has some pain occasionally has some redness.  His hemoglobin A1c most recently is 6.1 he states he is not on cholesterol or blood pressure medications.  He has a history of GI ulcer and is not on any anti-inflammatories.  Assessment & Plan: Visit Diagnoses:  1. Pain in left foot     Plan: Recommend he can try omega-3 approximately 1000 mg a day of DHA and EPA.  Follow-Up Instructions: Return if symptoms worsen or fail to improve.   Ortho Exam  Patient is alert, oriented, no adenopathy, well-dressed, normal affect, normal respiratory effort. Examination patient has a strong palpable dorsalis pedis pulse.  There is no tenderness to palpation over the surgical incision there is some mild redness there is no open wounds no drainage no cellulitis.  Imaging: XR Foot Complete Left  Result Date: 10/09/2021 Three-view radiographs of the left foot shows a well-healed Jones fracture of the base of the fifth metatarsal.  There are no lytic changes no signs of chronic infection.  No images are attached to the encounter.  Labs: Lab Results  Component Value Date   HGBA1C 7.4 (H) 04/25/2014   HGBA1C 7.4 (H) 04/25/2014   ESRSEDRATE 9 09/26/2019   ESRSEDRATE 12 09/09/2017   ESRSEDRATE 58 (H) 06/01/2014   REPTSTATUS 10/08/2020 FINAL 10/03/2020   GRAMSTAIN  10/03/2020    FEW WBC PRESENT,  PREDOMINANTLY MONONUCLEAR NO ORGANISMS SEEN    CULT  10/03/2020    NO ANAEROBES ISOLATED Performed at [facility], [address], [place], [place] [address]      Lab Results  Component Value Date   ALBUMIN 3.7 03/15/2018   ALBUMIN 3.7 09/09/2017   ALBUMIN 4.2 03/30/2017    Lab Results  Component Value Date   MG 2.3 04/28/2014   MG 2.4 04/28/2014   MG 2.7 (H) 04/27/2014   Lab Results  Component Value Date   VD25OH 57 06/22/2019    No results found for: PREALBUMIN CBC EXTENDED Latest Ref Rng & Units 04/01/2021 10/03/2020 12/19/2019  WBC 4.0 - 10.5 K/uL - 12.9(H) 10.5  RBC 4.22 - 5.81 MIL/uL - 3.79(L) 4.27  HGB 13.0 - 17.7 g/dL 11.3(L) 11.7(L) 13.1(L)  HCT 37.5 - 51.0 % 33.9(L) 36.6(L) 39.4  PLT 150 - 400 K/uL - 275 251  NEUTROABS 1,500 - 7,800 cells/uL - - 5,660  LYMPHSABS 850 - 3,900 cells/uL - - 3,507     There is no height or weight on file to calculate BMI.  Orders:  Orders Placed This Encounter  Procedures   XR Foot Complete Left   No orders of the defined types were placed in this encounter.    Procedures: No procedures performed  Clinical  Data: No additional findings.  ROS:  All other systems negative, except as noted in the HPI. Review of Systems  Objective: Vital Signs: There were no vitals taken for this visit.  Specialty Comments:  No specialty comments available.  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 03/22/2020   Osteopenia of multiple sites 02/28/2020   PVD (peripheral vascular disease) (HCC) 10/11/2019   Chronic venous insufficiency 10/05/2018   Diabetic cataract (HCC) 05/21/2018   Controlled type 1 diabetes mellitus with diabetic peripheral angiopathy without gangrene (HCC) 08/14/2017   Dyslipidemia 03/26/2017   High risk medication use 09/23/2016    History of hypertension 09/23/2016   History of chronic kidney disease 09/23/2016   History of coronary artery disease 09/23/2016   Tobacco abuse 09/23/2016   Primary osteoarthritis of both knees 09/23/2016   History of diabetes mellitus 09/23/2016   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 (HCC) 11/20/2015   Chest pain with high risk for cardiac etiology 10/29/2015   Asymptomatic bilateral carotid artery stenosis 06/08/2014   Pleural effusion, bilateral 06/03/2014   Dyspnea 06/01/2014   Diabetes (HCC) 05/03/2014   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/03/2014   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   Chronic renal disease, stage 3, moderately decreased glomerular filtration rate between 30-59 mL/min/1.73 square meter (HCC) 05/03/2014   CAD (coronary artery disease) 04/27/2014   Acne 12/19/2013   On isotretinoin therapy 12/19/2013   Nuclear sclerotic cataract, bilateral 12/19/2011   Vitreous hemorrhage (HCC) 06/16/2011   Past Medical History:  Diagnosis Date   Anginal pain (HCC)    Anxiety    Arthritis    RA IN HANDS   Asthma    CAD (coronary artery disease) 04/27/2014   Chronic renal disease, stage 3, moderately decreased glomerular filtration rate between 30-59 mL/min/1.73 square meter (HCC) 05/03/2014   COPD (chronic obstructive pulmonary disease) (HCC)    Coronary artery disease    Diabetes (HCC) 05/03/2014   Diabetes mellitus without complication (HCC)    type 1   Hyperlipidemia    Left shoulder pain    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/03/2014   Shortness of breath    Sleep apnea    mild OSA, no CPAP   Unstable angina pectoris (HCC) 04/25/2014    Family History  Problem Relation Age of Onset   Prostate cancer Father    Heart disease Father    Stomach cancer Mother    Heart disease Brother    Asthma Daughter    Healthy Daughter     Past  Surgical History:  Procedure Laterality Date   CARDIAC CATHETERIZATION  04/25/2014   BY DR Jacinto Halim   CARDIAC CATHETERIZATION N/A 10/30/2015   Procedure: Left Heart Cath and Cors/Grafts Angiography;  Surgeon: Yates Decamp, MD;  Location: [facility] INVASIVE CV LAB;  Service: Cardiovascular;  Laterality: N/A;   CARPAL TUNNEL RELEASE     CATARACT EXTRACTION, BILATERAL     CORONARY ARTERY BYPASS GRAFT N/A 04/27/2014   Procedure: CORONARY ARTERY BYPASS GRAFTING on pump using left internal mammary artery to LAD coronary artery, right great saphenous vein graft to diagonal coronary artery with sequential to OM1 and circumflex coronary arteries. Right greater saphenous vein graft to posterior descending coronary artery. ;  Surgeon: Delight Ovens, MD;  Location: [facility] OR;  Service: Open Heart Surgery;  Laterality: N   elbow drained Left 05/09/2019   ENDOVEIN HARVEST OF GREATER SAPHENOUS VEIN Right 04/27/2014   Procedure: ENDOVEIN HARVEST OF GREATER SAPHENOUS VEIN;  Surgeon: Delight Ovens, MD;  Location: [facility] OR;  Service: Open Heart Surgery;  Laterality: Right;   EXCISION ORAL TUMOR N/A 03/01/2018   Procedure: EXCISION ORAL TUMOR;  Surgeon: Christia Reading, MD;  Location: [facility];  Service: ENT;  Laterality: N/A;   EYE SURGERY     LASER   EYE SURGERY Bilateral    astigmatism correction    HARDWARE REMOVAL Left 10/03/2020   Procedure: LEFT FOOT REMOVAL HARDWARE, PLACE VANC POWDER;  Surgeon: Nadara Mustard, MD;  Location: [facility] OR;  Service: Orthopedics;  Laterality: Left;   INTRAOPERATIVE TRANSESOPHAGEAL ECHOCARDIOGRAM N/A 04/27/2014   Procedure: INTRAOPERATIVE TRANSESOPHAGEAL ECHOCARDIOGRAM;  Surgeon: Delight Ovens, MD;  Location: [facility] OR;  Service: Open Heart Surgery;  Laterality: N/A;   LEFT HEART CATHETERIZATION WITH CORONARY ANGIOGRAM N/A 04/25/2014   Procedure: LEFT HEART CATHETERIZATION WITH CORONARY ANGIOGRAM;  Surgeon: Pamella Pert, MD;  Location: [facility] CATH LAB;  Service: Cardiovascular;   Laterality: N/A;   MOUTH SURGERY  11/15/2017   tongue surgery    ORIF TOE FRACTURE Left 03/22/2020   Procedure: OPEN REDUCTION INTERNAL FIXATION (ORIF) Non Union 5th Metatarsal;  Surgeon: Kathryne Hitch, MD;  Location: [facility];  Service: Orthopedics;  Laterality: Left;   Social History   Occupational History   Not on file  Tobacco Use   Smoking status: Every Day    Packs/day: 0.25    Years: 34.00    Pack years: 8.50    Types: Cigarettes   Smokeless tobacco: Never  Vaping Use   Vaping Use: Never used  Substance and Sexual Activity   Alcohol use: Yes    Alcohol/week: 0.0 standard drinks    Comment: RARE   Drug use: No   Sexual activity: Not on file

## 2021-10-24 ENCOUNTER — Other Ambulatory Visit: Payer: Self-pay

## 2021-10-24 ENCOUNTER — Ambulatory Visit (INDEPENDENT_AMBULATORY_CARE_PROVIDER_SITE_OTHER): Payer: 59

## 2021-10-24 DIAGNOSIS — M2041 Other hammer toe(s) (acquired), right foot: Secondary | ICD-10-CM

## 2021-10-24 DIAGNOSIS — E1042 Type 1 diabetes mellitus with diabetic polyneuropathy: Secondary | ICD-10-CM | POA: Diagnosis not present

## 2021-10-24 DIAGNOSIS — M2042 Other hammer toe(s) (acquired), left foot: Secondary | ICD-10-CM | POA: Diagnosis not present

## 2021-10-24 DIAGNOSIS — Z8631 Personal history of diabetic foot ulcer: Secondary | ICD-10-CM | POA: Diagnosis not present

## 2021-10-24 NOTE — Progress Notes (Signed)
SITUATION ?Reason for Visit: Fitting of Diabetic Shoes & Insoles ?Patient / Caregiver Report:  Patient is satisfied with fit and function of shoes and insoles. ? ?OBJECTIVE DATA: ?Patient History / Diagnosis:   ?  ICD-10-CM   ?1. Diabetic polyneuropathy associated with type 1 diabetes mellitus (HCC)  E10.42   ?  ?2. Hammer toes of both feet  M20.41   ? M20.42   ?  ?3. History of diabetic ulcer of foot  Z86.31   ?  ? ? ?Change in Status:   None ? ?ACTIONS PERFORMED: ?In-Person Delivery, patient was fit with: ?- 1x pair A5500 PDAC approved prefabricated Diabetic Shoes: Apex X801M 9W ?- 1x pair T1572 PDAC approved vacuum formed custom diabetic insoles; [employer]: [id number] ? ?Shoes and insoles were verified for structural integrity and safety. Patient wore shoes and insoles in office. Skin was inspected and free of areas of concern after wearing shoes and inserts. Shoes and inserts fit properly. Patient / Caregiver provided with ferbal instruction and demonstration regarding donning, doffing, wear, care, proper fit, function, purpose, cleaning, and use of shoes and insoles ' and in all related precautions and risks and benefits regarding shoes and insoles. Patient / Caregiver was instructed to wear properly fitting socks with shoes at all times. Patient was also provided with verbal instruction regarding how to report any failures or malfunctions of shoes or inserts, and necessary follow up care. Patient / Caregiver was also instructed to contact physician regarding change in status that may affect function of shoes and inserts.  ? ?Patient / Caregiver verbalized undersatnding of instruction provided. Patient / Caregiver demonstrated independence with proper donning and doffing of shoes and inserts. ? ?PLAN ?Patient to follow with treating physician as recommended. Plan of care was discussed with and agreed upon by patient and/or caregiver. All questions were answered and concerns addressed. ? ?

## 2021-10-30 ENCOUNTER — Other Ambulatory Visit: Payer: Self-pay | Admitting: Cardiology

## 2021-11-20 ENCOUNTER — Ambulatory Visit: Payer: 59

## 2021-11-20 DIAGNOSIS — I6523 Occlusion and stenosis of bilateral carotid arteries: Secondary | ICD-10-CM

## 2021-11-21 ENCOUNTER — Other Ambulatory Visit: Payer: 59

## 2021-11-22 NOTE — Progress Notes (Signed)
? ?Primary Physician:  Garlan FillersPaterson, Daniel G, MD ? ?Patient ID: [name], male    DOB: 08-Dec-1965, 56 y.o.   MRN: [id number] ? ?Subjective:  ? ?Chief Complaint  ?Patient presents with  ? Coronary Artery Disease  ? Hypertension  ? Follow-up  ?  6 month  ? ?HPI: [name]  is a 56 y.o. male  with history of Type I Diabetes, rheumatoid arthritis, hyperlipidemia, and CAD S/P coronary artery bypass grafting on 04/27/2014.  Patient with history of GI bleed 03/2021, at which time aspirin was held. ? ?Patient presents for 3235-month follow-up.  Last office visit patient was stable from a cardiovascular standpoint, therefore no changes were made.  Patient continues to follow closely with Dr. Lavonia DraftsScholer for GI management.  Given ongoing GI symptoms patient reports Dr. Lavonia DraftsScholer discontinued statin therapy, and has advised him to continue to hold aspirin.  Cardiovascular standpoint patient is feeling relatively well overall.  He has had occasional episodes of brief chest discomfort lasting several seconds at rest.  Patient has not taken sublingual nitroglycerin.  Unfortunately patient admits to vaping more and more over the last few months.  He monitors his blood pressure daily at home with average readings 130s/70s mmHg. ? ?Patient denies worsening dyspnea, palpitations, syncope, near syncope.  Denies orthopnea, PND, leg edema. ? ?Past Medical History:  ?Diagnosis Date  ? Anginal pain (HCC)   ? Anxiety   ? Arthritis   ? RA IN HANDS  ? Asthma   ? CAD (coronary artery disease) 04/27/2014  ? Chronic renal disease, stage 3, moderately decreased glomerular filtration rate between 30-59 mL/min/1.73 square meter (HCC) 05/03/2014  ? COPD (chronic obstructive pulmonary disease) (HCC)   ? Coronary artery disease   ? Diabetes (HCC) 05/03/2014  ? Diabetes mellitus without complication (HCC)   ? type 1  ? Hyperlipidemia   ? Left shoulder pain   ? 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/03/2014  ? Shortness of breath   ? Sleep  apnea   ? mild OSA, no CPAP  ? Unstable angina pectoris (HCC) 04/25/2014  ? ? ?Past Surgical History:  ?Procedure Laterality Date  ? CARDIAC CATHETERIZATION  04/25/2014  ? BY DR Jacinto HalimGANJI  ? CARDIAC CATHETERIZATION N/A 10/30/2015  ? Procedure: Left Heart Cath and Cors/Grafts Angiography;  Surgeon: Yates DecampJay Ganji, MD;  [facility];  Service: Cardiovascular;  Laterality: N/A;  ? CARPAL TUNNEL RELEASE    ? CATARACT EXTRACTION, BILATERAL    ? CORONARY ARTERY BYPASS GRAFT N/A 04/27/2014  ? Procedure: CORONARY ARTERY BYPASS GRAFTING on pump using left internal mammary artery to LAD coronary artery, right great saphenous vein graft to diagonal coronary artery with sequential to OM1 and circumflex coronary arteries. Right greater saphenous vein graft to posterior descending coronary artery. ;  Surgeon: Delight OvensEdward B Gerhardt, MD;  [facility];  Service: Open Heart Surgery;  Laterality: N  ? elbow drained Left 05/09/2019  ? ENDOVEIN HARVEST OF GREATER SAPHENOUS VEIN Right 04/27/2014  ? Procedure: ENDOVEIN HARVEST OF GREATER SAPHENOUS VEIN;  Surgeon: Delight OvensEdward B Gerhardt, MD;  [facility];  Service: Open Heart Surgery;  Laterality: Right;  ? EXCISION ORAL TUMOR N/A 03/01/2018  ? Procedure: EXCISION ORAL TUMOR;  Surgeon: Christia ReadingBates, Dwight, MD;  Location: [facility];  Service: ENT;  Laterality: N/A;  ? EYE SURGERY    ? LASER  ? EYE SURGERY Bilateral   ? astigmatism correction   ? HARDWARE REMOVAL Left 10/03/2020  ? Procedure: LEFT  FOOT REMOVAL HARDWARE, PLACE VANC POWDER;  Surgeon: Nadara Mustard, MD;  Location[facility] OR;  Service: Orthopedics;  Laterality: Left;  ? INTRAOPERATIVE TRANSESOPHAGEAL ECHOCARDIOGRAM N/A 04/27/2014  ? Procedure: INTRAOPERATIVE TRANSESOPHAGEAL ECHOCARDIOGRAM;  Surgeon: Delight Ovens, MD;  Location: [facility] OR;  Service: Open Heart Surgery;  Laterality: N/A;  ? LEFT HEART CATHETERIZATION WITH CORONARY ANGIOGRAM N/A 04/25/2014  ? Procedure: LEFT HEART CATHETERIZATION WITH CORONARY ANGIOGRAM;   Surgeon: Pamella Pert, MD;  Location: [facility] CATH LAB;  Service: Cardiovascular;  Laterality: N/A;  ? MOUTH SURGERY  11/15/2017  ? tongue surgery   ? ORIF TOE FRACTURE Left 03/22/2020  ? Procedure: OPEN REDUCTION INTERNAL FIXATION (ORIF) Non Union 5th Metatarsal;  Surgeon: Kathryne Hitch, MD;  Location: [facility];  Service: Orthopedics;  Laterality: Left;  ? ?Family History  ?Problem Relation Age of Onset  ? Prostate cancer Father   ? Heart disease Father   ? Stomach cancer Mother   ? Heart disease Brother   ? Asthma Daughter   ? Healthy Daughter   ? ? ?Social History  ? ?Tobacco Use  ? Smoking status: Every Day  ?  Packs/day: 0.25  ?  Years: 34.00  ?  Pack years: 8.50  ?  Types: Cigarettes  ? Smokeless tobacco: Never  ?Substance Use Topics  ? Alcohol use: Yes  ?  Alcohol/week: 0.0 standard drinks  ?  Comment: RARE  ?  ?Marital status: Divorced  ? ?Review of Systems  ?Constitutional: Negative for malaise/fatigue.  ?Cardiovascular:  Positive for dyspnea on exertion (stable). Negative for chest pain, claudication, leg swelling, near-syncope, orthopnea, palpitations, paroxysmal nocturnal dyspnea and syncope.  ?Respiratory:  Negative for shortness of breath.   ?Hematologic/Lymphatic: Does not bruise/bleed easily.  ?Musculoskeletal:  Positive for arthritis, back pain and neck pain.  ?Neurological:  Negative for dizziness and weakness.  ?Objective:  ?Blood pressure (!) 140/58, pulse (!) 58, temperature 97.7 ?F (36.5 ?C), resp. rate 16, height  (1.727 m), weight 200 lb (90.7 kg), SpO2 95 %. Body mass index is 30.41 kg/m?.  ? ? ?  11/25/2021  ?  8:31 AM 05/27/2021  ?  9:00 AM 05/27/2021  ?  8:39 AM  ?Vitals with BMI  ?Height     ?Weight 200 lbs    ?BMI 30.42    ?Systolic 140 120 161  ?Diastolic 58 61 72  ?Pulse 58  64  ? ?  ?Physical Exam ?Vitals reviewed.  ?Constitutional:   ?   Appearance: He is well-developed.  ?Cardiovascular:  ?   Rate and Rhythm: Normal rate and regular rhythm.  ?    Pulses: Intact distal pulses.     ?     Carotid pulses are  on the right side with bruit and  on the left side with bruit. ?     Femoral pulses are 2+ on the right side and 2+ on the left side. ?     Popliteal pulses are 2+ on the right side and 2+ on the left side.  ?     Dorsalis pedis pulses are 1+ on the right side and 2+ on the left side.  ?     Posterior tibial pulses are 1+ on the right side and 1+ on the left side.  ?   Heart sounds: Normal heart sounds, S1 normal and S2 normal. No murmur heard. ?  No gallop.  ?Pulmonary:  ?   Effort: Pulmonary effort is normal. No accessory muscle usage or  respiratory distress.  ?   Breath sounds: Wheezing (expiratory) present. No rhonchi or rales.  ?Chest:  ?   Chest wall: No tenderness.  ?Musculoskeletal:  ?   Right lower leg: No edema.  ?   Left lower leg: No edema.  ?Neurological:  ?   Mental Status: He is alert.  ? ?Laboratory examination:  ? ? ?  Latest Ref Rng & Units 10/03/2020  ?  6:45 AM 03/19/2020  ?  2:57 PM 12/19/2019  ?  9:16 AM  ?CMP  ?Glucose 70 - 99 mg/dL 696   789   89    ?BUN 6 - 20 mg/dL 30   10   14     ?Creatinine 0.61 - 1.24 mg/dL 3.81   0.17   5.10    ?Sodium 135 - 145 mmol/L 135   139   138    ?Potassium 3.5 - 5.1 mmol/L 4.4   4.9   4.6    ?Chloride 98 - 111 mmol/L 104   105   105    ?CO2 22 - 32 mmol/L 23   26   25     ?Calcium 8.9 - 10.3 mg/dL 8.6   8.9   9.0    ?Total Protein 6.1 - 8.1 g/dL   6.5    ? 6.5    ?Total Bilirubin 0.2 - 1.2 mg/dL   0.3    ?AST 10 - 35 U/L   37    ?ALT 9 - 46 U/L   40    ? ? ?  Latest Ref Rng & Units 04/01/2021  ?  3:37 PM 10/03/2020  ?  6:45 AM 12/19/2019  ?  9:16 AM  ?CBC  ?WBC 4.0 - 10.5 K/uL  12.9   10.5    ?Hemoglobin 13.0 - 17.7 g/dL 25.8   52.7   78.2    ?Hematocrit 37.5 - 51.0 % 33.9   36.6   39.4    ?Platelets 150 - 400 K/uL  275   251    ? ?Lipid Panel  ?   ?Component Value Date/Time  ? CHOL 55 (L) 02/10/2019 0907  ? TRIG 43 02/10/2019 0907  ? HDL 29 (L) 02/10/2019 4235  ? CHOLHDL 1.9 02/10/2019 0907  ? LDLCALC 17  02/10/2019 0907  ? ?HEMOGLOBIN A1C ?Lab Results  ?Component Value Date  ? HGBA1C 7.4 (H) 04/25/2014  ? MPG 166 (H) 04/25/2014  ? ?TSH ?No results for input(s): TSH in the last 8760 hours. ? ?External labs: ?7/7

## 2021-11-24 ENCOUNTER — Other Ambulatory Visit: Payer: Self-pay | Admitting: Cardiology

## 2021-11-25 ENCOUNTER — Ambulatory Visit: Payer: 59 | Admitting: Student

## 2021-11-25 ENCOUNTER — Encounter: Payer: Self-pay | Admitting: Student

## 2021-11-25 VITALS — BP 140/58 | HR 58 | Temp 97.7°F | Resp 16 | Ht 68.0 in | Wt 200.0 lb

## 2021-11-25 DIAGNOSIS — I10 Essential (primary) hypertension: Secondary | ICD-10-CM

## 2021-11-25 DIAGNOSIS — I2510 Atherosclerotic heart disease of native coronary artery without angina pectoris: Secondary | ICD-10-CM

## 2021-11-25 DIAGNOSIS — I6523 Occlusion and stenosis of bilateral carotid arteries: Secondary | ICD-10-CM

## 2021-11-25 MED ORDER — SIMVASTATIN 20 MG PO TABS: 20.0000 mg | ORAL_TABLET | Freq: Every day | ORAL | 3 refills | Status: DC

## 2021-12-04 NOTE — Progress Notes (Signed)
Seeing you in 6 months and can order repeat study for the year

## 2021-12-10 LAB — LIPID PANEL WITH LDL/HDL RATIO
Cholesterol, Total: 81 mg/dL — ABNORMAL LOW (ref 100–199)
HDL: 28 mg/dL — ABNORMAL LOW (ref >39)
LDL Chol Calc (NIH): 37 mg/dL (ref 0–99)
LDL/HDL Ratio: 1.3 ratio (ref 0.0–3.6)
Triglycerides: 77 mg/dL (ref 0–149)
VLDL Cholesterol Cal: 16 mg/dL (ref 5–40)

## 2021-12-10 NOTE — Progress Notes (Signed)
Called pt to inform him about his lab results. Pt mention he did not have any issue with simvastatin.

## 2022-01-02 ENCOUNTER — Ambulatory Visit: Payer: 59 | Admitting: Podiatry

## 2022-01-02 ENCOUNTER — Encounter: Payer: Self-pay | Admitting: Podiatry

## 2022-01-02 DIAGNOSIS — B351 Tinea unguium: Secondary | ICD-10-CM

## 2022-01-02 DIAGNOSIS — I739 Peripheral vascular disease, unspecified: Secondary | ICD-10-CM

## 2022-01-02 DIAGNOSIS — M79675 Pain in left toe(s): Secondary | ICD-10-CM | POA: Diagnosis not present

## 2022-01-02 DIAGNOSIS — E1042 Type 1 diabetes mellitus with diabetic polyneuropathy: Secondary | ICD-10-CM

## 2022-01-02 DIAGNOSIS — M79674 Pain in right toe(s): Secondary | ICD-10-CM | POA: Diagnosis not present

## 2022-01-04 NOTE — Progress Notes (Signed)
Presents today chief complaint of painful elongated nails.  States that his diabetes is under better control but not where he would like it to be yet.  Current hemoglobin A1c he states is 7.5.  Objective: Vital signs are stable he is alert and oriented x3 there is no erythema cellulitis drainage or odor no change in his past medical history medications allergies physical exam.  Toenails are long thick yellow dystrophic clinically mycotic.  Assessment: Diabetes mellitus with diabetic peripheral neuropathy pain in limb secondary onychomycosis.  Plan: Debrided toenails 1 through 5 bilaterally follow-up with him in 3 months.

## 2022-01-30 DIAGNOSIS — J343 Hypertrophy of nasal turbinates: Secondary | ICD-10-CM | POA: Insufficient documentation

## 2022-01-30 DIAGNOSIS — J339 Nasal polyp, unspecified: Secondary | ICD-10-CM | POA: Insufficient documentation

## 2022-01-30 DIAGNOSIS — J342 Deviated nasal septum: Secondary | ICD-10-CM | POA: Insufficient documentation

## 2022-02-06 ENCOUNTER — Encounter: Payer: Self-pay | Admitting: Student

## 2022-02-06 ENCOUNTER — Ambulatory Visit: Payer: 59 | Admitting: Student

## 2022-02-06 VITALS — BP 115/68 | HR 65 | Temp 98.4°F | Resp 16 | Ht 68.0 in | Wt 200.0 lb

## 2022-02-06 DIAGNOSIS — F17200 Nicotine dependence, unspecified, uncomplicated: Secondary | ICD-10-CM

## 2022-02-06 DIAGNOSIS — R072 Precordial pain: Secondary | ICD-10-CM

## 2022-02-06 DIAGNOSIS — I2510 Atherosclerotic heart disease of native coronary artery without angina pectoris: Secondary | ICD-10-CM

## 2022-02-06 DIAGNOSIS — R0609 Other forms of dyspnea: Secondary | ICD-10-CM

## 2022-02-06 MED ORDER — RANOLAZINE ER 500 MG PO TB12: 500.0000 mg | ORAL_TABLET | Freq: Two times a day (BID) | ORAL | 3 refills | Status: DC

## 2022-02-06 NOTE — Progress Notes (Signed)
Primary Physician:  Donnajean Lopes, MD  Patient ID: [name], male    DOB: 1965/11/01, 56 y.o.   MRN: [id number]  Subjective:   No chief complaint on file.  HPI: [name]  is a 56 y.o. male  with history of Type I Diabetes, rheumatoid arthritis, hyperlipidemia, and CAD S/P coronary artery bypass grafting on 04/27/2014.  Patient with history of GI bleed 03/2021, at which time aspirin was held.  Patient presents today with concerns of intermittent atypical chest pain.  He states over the last week he has had 3 episodes for which she has taken nitroglycerin which rapidly improved chest discomfort.  He does unfortunately continue to smoke half pack per day.  Patient denies palpitations, syncope, near syncope.  Denies orthopnea, PND, leg edema.  Past Medical History:  Diagnosis Date   Anginal pain ([place])    Anxiety    Arthritis    RA IN HANDS   Asthma    CAD (coronary artery disease) 04/27/2014   Chronic renal disease, stage 3, moderately decreased glomerular filtration rate between 30-59 mL/min/1.73 square meter ([place]) 05/03/2014   COPD (chronic obstructive pulmonary disease) (HCC)    Coronary artery disease    Diabetes ([place]) 05/03/2014   Diabetes mellitus without complication (HCC)    type 1   Hyperlipidemia    Left shoulder pain    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/03/2014   Shortness of breath    Sleep apnea    mild OSA, no CPAP   Unstable angina pectoris ([place]) 04/25/2014    Past Surgical History:  Procedure Laterality Date   CARDIAC CATHETERIZATION  04/25/2014   BY DR [place] N/A 10/30/2015   Procedure: Left Heart Cath and Cors/Grafts Angiography;  Surgeon: Adrian Prows, MD;  Location: [facility];  Service: Cardiovascular;  Laterality: N/A;   CARPAL TUNNEL RELEASE     CATARACT EXTRACTION, BILATERAL     CORONARY ARTERY BYPASS GRAFT N/A 04/27/2014   Procedure: CORONARY ARTERY BYPASS GRAFTING on pump using left  internal mammary artery to LAD coronary artery, right great saphenous vein graft to diagonal coronary artery with sequential to OM1 and circumflex coronary arteries. Right greater saphenous vein graft to posterior descending coronary artery. ;  Surgeon: Grace Isaac, MD;  Location: [place];  Service: Open Heart Surgery;  Laterality: N   elbow drained Left 05/09/2019   ENDOVEIN HARVEST OF GREATER SAPHENOUS VEIN Right 04/27/2014   Procedure: ENDOVEIN HARVEST OF GREATER SAPHENOUS VEIN;  Surgeon: Grace Isaac, MD;  Location: [place] [facility];  Service: Open Heart Surgery;  Laterality: Right;   EXCISION ORAL TUMOR N/A 03/01/2018   Procedure: EXCISION ORAL TUMOR;  Surgeon: Melida Quitter, MD;  Location: [facility];  Service: ENT;  Laterality: N/A;   EYE SURGERY     LASER   EYE SURGERY Bilateral    astigmatism correction    HARDWARE REMOVAL Left 10/03/2020   Procedure: LEFT FOOT REMOVAL HARDWARE, PLACE VANC POWDER;  Surgeon: Newt Minion, MD;  Location: [place];  Service: Orthopedics;  Laterality: Left;   INTRAOPERATIVE TRANSESOPHAGEAL ECHOCARDIOGRAM N/A 04/27/2014   Procedure: INTRAOPERATIVE TRANSESOPHAGEAL ECHOCARDIOGRAM;  Surgeon: Grace Isaac, MD;  Location: [place];  Service: Open Heart Surgery;  Laterality: N/A;   LEFT HEART CATHETERIZATION WITH CORONARY ANGIOGRAM N/A 04/25/2014   Procedure: LEFT HEART CATHETERIZATION WITH CORONARY ANGIOGRAM;  Surgeon: Laverda Page, MD;  Location[facility];  Service: Cardiovascular;  Laterality: N/A;  MOUTH SURGERY  11/15/2017   tongue surgery    ORIF TOE FRACTURE Left 03/22/2020   Procedure: OPEN REDUCTION INTERNAL FIXATION (ORIF) Non Union 5th Metatarsal;  Surgeon: Mcarthur Rossetti, MD;  Location: [place];  Service: Orthopedics;  Laterality: Left;   Family History  Problem Relation Age of Onset   Prostate cancer Father    Heart disease Father    Stomach cancer Mother    Heart disease Brother    Asthma Daughter     Healthy Daughter     Social History   Tobacco Use   Smoking status: Some Days    Packs/day: 0.25    Years: 34.00    Total pack years: 8.50    Types: Cigarettes   Smokeless tobacco: Never  Substance Use Topics   Alcohol use: Yes    Alcohol/week: 0.0 standard drinks of alcohol    Comment: RARE    Marital status: Divorced   Review of Systems  Constitutional: Negative for malaise/fatigue.  Cardiovascular:  Positive for chest pain and dyspnea on exertion (stable). Negative for claudication, leg swelling, near-syncope, orthopnea, palpitations, paroxysmal nocturnal dyspnea and syncope.  Respiratory:  Negative for shortness of breath.   Hematologic/Lymphatic: Does not bruise/bleed easily.  Musculoskeletal:  Positive for arthritis, back pain and neck pain.  Neurological:  Negative for dizziness and weakness.   Objective:  Blood pressure 115/68, pulse 65, temperature 98.4 F (36.9 C), temperature source Temporal, resp. rate 16, height 5' 8"  (1.727 m), weight 200 lb (90.7 kg), SpO2 97 %. Body mass index is 30.41 kg/m.      02/06/2022    2:54 PM 11/25/2021    8:31 AM 05/27/2021    9:00 AM  Vitals with BMI  Height 5' 8"  5' 8"    Weight 200 lbs 200 lbs   BMI 35.36 14.43   Systolic 154 008 676  Diastolic 68 58 61  Pulse 65 58      Physical Exam Vitals reviewed.  Constitutional:      Appearance: He is well-developed.  Cardiovascular:     Rate and Rhythm: Normal rate and regular rhythm.     Pulses: Intact distal pulses.          Carotid pulses are  on the right side with bruit and  on the left side with bruit.      Femoral pulses are 2+ on the right side and 2+ on the left side.      Popliteal pulses are 2+ on the right side and 2+ on the left side.       Dorsalis pedis pulses are 1+ on the right side and 2+ on the left side.       Posterior tibial pulses are 1+ on the right side and 1+ on the left side.     Heart sounds: Normal heart sounds, S1 normal and S2 normal. No murmur  heard.    No gallop.  Pulmonary:     Effort: Pulmonary effort is normal. No accessory muscle usage or respiratory distress.     Breath sounds: Wheezing (expiratory) and rhonchi (bilateral bases) present.  Chest:     Chest wall: No tenderness.  Musculoskeletal:     Right lower leg: No edema.     Left lower leg: No edema.  Neurological:     Mental Status: He is alert.    Laboratory examination:      Latest Ref Rng & Units 10/03/2020    6:45 AM 03/19/2020    2:57  PM 12/19/2019    9:16 AM  CMP  Glucose 70 - 99 mg/dL 169  228  89   BUN 6 - 20 mg/dL 30  10  14    Creatinine 0.61 - 1.24 mg/dL 1.52  1.38  1.54   Sodium 135 - 145 mmol/L 135  139  138   Potassium 3.5 - 5.1 mmol/L 4.4  4.9  4.6   Chloride 98 - 111 mmol/L 104  105  105   CO2 22 - 32 mmol/L 23  26  25    Calcium 8.9 - 10.3 mg/dL 8.6  8.9  9.0   Total Protein 6.1 - 8.1 g/dL 6.1 - 8.1 g/dL   6.5    6.5   Total Bilirubin 0.2 - 1.2 mg/dL   0.3   AST 10 - 35 U/L   37   ALT 9 - 46 U/L   40       Latest Ref Rng & Units 04/01/2021    3:37 PM 10/03/2020    6:45 AM 12/19/2019    9:16 AM  CBC  WBC 4.0 - 10.5 K/uL  12.9  10.5   Hemoglobin 13.0 - 17.7 g/dL 11.3  11.7  13.1   Hematocrit 37.5 - 51.0 % 33.9  36.6  39.4   Platelets 150 - 400 K/uL  275  251    Lipid Panel     Component Value Date/Time   CHOL 81 (L) 12/09/2021 1629   TRIG 77 12/09/2021 1629   HDL 28 (L) 12/09/2021 1629   CHOLHDL 1.9 02/10/2019 0907   LDLCALC 37 12/09/2021 1629   HEMOGLOBIN A1C Lab Results  Component Value Date   HGBA1C 7.4 (H) 04/25/2014   MPG 166 (H) 04/25/2014   TSH No results for input(s): "TSH" in the last 8760 hours.  External labs: 02/07/2021: Sodium 135, potassium 5.2, BUN 26, creatinine 1.65, GFR 48 Hgb 11.9, HCT 35.2, MCV 88.7, platelet 280  Allergies   Allergies  Allergen Reactions   Metoprolol Diarrhea   Niacin And Related     Other reaction(s): night sweats   Tramadol     Other reaction(s): SOB    Medications Prior to  Visit:   Outpatient Medications Prior to Visit  Medication Sig Dispense Refill   albuterol (VENTOLIN HFA) 108 (90 Base) MCG/ACT inhaler Inhale 2 puffs into the lungs every 6 (six) hours as needed for wheezing.      Ascorbic Acid (VITAMIN C) 1000 MG tablet Take 1,000 mg by mouth daily.     bisoprolol (ZEBETA) 5 MG tablet TAKE ONE-HALF TABLET BY  MOUTH DAILY 45 tablet 3   Blood Glucose Monitoring Suppl (ONETOUCH VERIO FLEX SYSTEM) w/Device KIT USE TO CHECK BLOOD GLUCOSE UP TO 10 TIMES PER DAY     buPROPion (WELLBUTRIN SR) 150 MG 12 hr tablet TAKE 1 TABLET BY MOUTH  DAILY 90 tablet 3   Cholecalciferol (VITAMIN D3) 125 MCG (5000 UT) TABS Take 5,000 Units by mouth daily.     COD LIVER OIL PO Take 415 mg by mouth daily.     Continuous Blood Gluc Sensor (FREESTYLE LIBRE 14 DAY SENSOR) MISC CHANGE EVERY 14 DAYS TO MONITOR BLOOD GLUCOSE     gabapentin (NEURONTIN) 300 MG capsule Take 1 capsule (300 mg total) by mouth at bedtime. 30 capsule 2   HUMALOG 100 UNIT/ML injection Inject 15-20 Units into the skin 3 (three) times daily with meals. Sliding scale  Depending on carb intake     Hydrocortisone, Perianal, 1 % CREA  SMARTSIG:Rectally 3 Times Daily PRN     insulin glargine (LANTUS) 100 UNIT/ML injection Inject 14-17 Units into the skin See admin instructions. Inject into the skin 17 units in the morning and 14 unit at bedtime     Lidocaine 4 % PTCH Place 1 patch onto the skin at bedtime.     LINZESS 145 MCG CAPS capsule Take 145 mcg by mouth every morning.     losartan (COZAAR) 25 MG tablet Take 25 mg by mouth daily.     magnesium gluconate (MAGONATE) 500 MG tablet Take 500 mg by mouth daily.     nitroGLYCERIN (NITROSTAT) 0.4 MG SL tablet DISSOLVE 1 TABLET UNDER THE TONGUE EVERY 5 MINUTES AS  NEEDED FOR CHEST PAIN. MAX  OF 3 TABLETS IN 15 MINUTES. CALL 911 IF PAIN PERSISTS. (Patient taking differently: Place 0.4 mg under the tongue every 5 (five) minutes as needed for chest pain.) 100 tablet 3    simvastatin (ZOCOR) 20 MG tablet Take 1 tablet (20 mg total) by mouth at bedtime. 30 tablet 3   tadalafil (CIALIS) 20 MG tablet Take 20 mg by mouth daily as needed for erectile dysfunction.     zinc gluconate 50 MG tablet Take 50 mg by mouth daily.     Continuous Blood Gluc Sensor (FREESTYLE LIBRE 14 DAY SENSOR) MISC CHANGE EVERY 14 DAYS TO MONITOR BLOOD GLUOSE     ipratropium (ATROVENT) 0.06 % nasal spray Place into both nostrils. (Patient not taking: Reported on 02/06/2022)     oxymetazoline (AFRIN) 0.05 % nasal spray Place 2 sprays into both nostrils at bedtime. (Patient not taking: Reported on 02/06/2022)     No facility-administered medications prior to visit.   Final Medications at End of Visit    Current Meds  Medication Sig   albuterol (VENTOLIN HFA) 108 (90 Base) MCG/ACT inhaler Inhale 2 puffs into the lungs every 6 (six) hours as needed for wheezing.    Ascorbic Acid (VITAMIN C) 1000 MG tablet Take 1,000 mg by mouth daily.   bisoprolol (ZEBETA) 5 MG tablet TAKE ONE-HALF TABLET BY  MOUTH DAILY   Blood Glucose Monitoring Suppl (ONETOUCH VERIO FLEX SYSTEM) w/Device KIT USE TO CHECK BLOOD GLUCOSE UP TO 10 TIMES PER DAY   buPROPion (WELLBUTRIN SR) 150 MG 12 hr tablet TAKE 1 TABLET BY MOUTH  DAILY   Cholecalciferol (VITAMIN D3) 125 MCG (5000 UT) TABS Take 5,000 Units by mouth daily.   COD LIVER OIL PO Take 415 mg by mouth daily.   Continuous Blood Gluc Sensor (FREESTYLE LIBRE 14 DAY SENSOR) MISC CHANGE EVERY 14 DAYS TO MONITOR BLOOD GLUCOSE   gabapentin (NEURONTIN) 300 MG capsule Take 1 capsule (300 mg total) by mouth at bedtime.   HUMALOG 100 UNIT/ML injection Inject 15-20 Units into the skin 3 (three) times daily with meals. Sliding scale  Depending on carb intake   Hydrocortisone, Perianal, 1 % CREA SMARTSIG:Rectally 3 Times Daily PRN   insulin glargine (LANTUS) 100 UNIT/ML injection Inject 14-17 Units into the skin See admin instructions. Inject into the skin 17 units in the morning and  14 unit at bedtime   Lidocaine 4 % PTCH Place 1 patch onto the skin at bedtime.   LINZESS 145 MCG CAPS capsule Take 145 mcg by mouth every morning.   losartan (COZAAR) 25 MG tablet Take 25 mg by mouth daily.   magnesium gluconate (MAGONATE) 500 MG tablet Take 500 mg by mouth daily.   nitroGLYCERIN (NITROSTAT) 0.4 MG SL tablet DISSOLVE 1 TABLET  UNDER THE TONGUE EVERY 5 MINUTES AS  NEEDED FOR CHEST PAIN. MAX  OF 3 TABLETS IN 15 MINUTES. CALL 911 IF PAIN PERSISTS. (Patient taking differently: Place 0.4 mg under the tongue every 5 (five) minutes as needed for chest pain.)   ranolazine (RANEXA) 500 MG 12 hr tablet Take 1 tablet (500 mg total) by mouth 2 (two) times daily.   simvastatin (ZOCOR) 20 MG tablet Take 1 tablet (20 mg total) by mouth at bedtime.   tadalafil (CIALIS) 20 MG tablet Take 20 mg by mouth daily as needed for erectile dysfunction.   zinc gluconate 50 MG tablet Take 50 mg by mouth daily.   Radiology   No results found.  Cardiac Studies:   CABG 04/27/2014: LIMA to LAD, SVG to D1, SVG to OM1 and distal circumflex, SVG to PDA. Dr. Johnette Abraham  Coronary angiogram 10/30/2015: Normal LVEF, 55%. Mid LAD occluded after D1. LIMA to LAD, SVG to D1 patent. Circumflex occluded in the midsegment after OM1. SVG to OM2 patent, sequential branch to his to circumflex occluded. Distal circumflex filled by  Lexiscan Tetrofosmin Stress Test  07/20/2019: Nondiagnostic ECG stress. There is a small sized mild reversible mild defect in the lateral region.  Compared to the study done on 10/12/2015, large inferolateral ischemia not present. Defect is consistent with known occluded distal circumflex coronary artery. Stress LV EF: 62%.  Intermediate risk study.   ABI 10/11/2019: Right: Resting right ankle-brachial index indicates noncompressible right  lower extremity arteries. The right toe-brachial index is abnormal. RT  great toe pressure = 83 mmHg.  Left: Resting left ankle-brachial index is within normal  range. The left  toe-brachial index is abnormal. LT Great toe pressure = 70 mmHg.   Echocardiogram 09/25/2020:    Normal LV systolic function with EF 62%. Left ventricle cavity is normal  in size. Normal global wall motion. Calculated EF 62%.  Structurally normal mitral valve. No evidence of mitral stenosis. Mild  (Grade I) mitral regurgitation.  Structurally normal tricuspid valve with trace regurgitation. No evidence  of tricuspid valve stenosis. No evidence of pulmonary hypertension.  No significant change from 06/08/2014.  Carotid artery duplex 11/20/2021: Duplex suggests stenosis in the right internal carotid artery (16-49%). Duplex suggests stenosis in the left internal carotid artery (16-49%). Antegrade right vertebral artery flow.  No significant change since 10/25/2020. Follow up in one year is appropriate if clinically indicated.  EKG  02/06/2022: Sinus rhythm at a rate of 61 bpm.  Normal axis.  Nonspecific T wave abnormality.  Compared EKG 11/25/2021, no significant change.  Assessment:     ICD-10-CM   1. Coronary artery disease involving native coronary artery of native heart without angina pectoris  I25.10     2. Precordial pain  R07.2 EKG 12-Lead    3. Dyspnea on exertion  R06.09 Ambulatory referral to Pulmonology    4. Smoking  F17.200 Ambulatory referral to Pulmonology     Meds ordered this encounter  Medications   ranolazine (RANEXA) 500 MG 12 hr tablet    Sig: Take 1 tablet (500 mg total) by mouth 2 (two) times daily.    Dispense:  60 tablet    Refill:  3   There are no discontinued medications.  Recommendations:   LEXTON HIDALGO  is a 56 y.o. with history of Type I Diabetes, rheumatoid arthritis, hyperlipidemia, and CAD S/P coronary artery bypass grafting on 04/27/2014.  Patient with history of GI bleed 03/2021, at which time aspirin was held.  Patient presents  with complaints of intermittent atypical chest pain for which she has taken nitroglycerin 3  times.  Patient's cardiac exam remained stable, however he has significant bilateral crackles on lung exam.  Have a low suspicion patient's chest pain is related to significant progression of CAD as EKG remained stable, stress test is improved compared to previous and heart function is normal.  Patient does have underlying small vessel disease, will therefore start Ranexa 500 mg p.o. twice daily.  He has a history of constipation, therefore advised patient to take Metamucil or MiraLAX daily as Ranexa may worsen constipation issues.  Suspect patient's dyspnea and chest pain may be related to underlying lung pathology given continued smoking and significant crackles noted on lung exam.  Will refer to pulmonology for their evaluation.  At this time shared decision was to hold off on repeat cardiac catheterization.  Patient was seen in collaboration with Dr. Einar Gip. He also reviewed patient's chart and examined the patient. Dr. Einar Gip is in agreement of the plan.    Alethia Berthold, PA-C 02/06/2022, 3:49 PM Office: [phone]

## 2022-02-20 ENCOUNTER — Telehealth: Payer: Self-pay

## 2022-02-20 DIAGNOSIS — R0609 Other forms of dyspnea: Secondary | ICD-10-CM

## 2022-02-20 DIAGNOSIS — F17200 Nicotine dependence, unspecified, uncomplicated: Secondary | ICD-10-CM

## 2022-02-20 NOTE — Telephone Encounter (Signed)
JG:  Patient stated that the newest medication he was prescribed [facility]) is causing him to be constipated and wants are refill on Linzess because the Miralax.  Bev: Patient would like a status on a referral to pulmonology and possibly another referral but he can't remember for what. Please call patient and speak with him. Thank you.

## 2022-02-21 NOTE — Telephone Encounter (Signed)
ICD-10-CM   1. Dyspnea on exertion  R06.09 Ambulatory referral to Pulmonology    2. Smoking  F17.200 Ambulatory referral to Pulmonology     Orders Placed This Encounter  Procedures   Ambulatory referral to Pulmonology    Referral Priority:   Routine    Referral Type:   Consultation    Referral Reason:   Specialty Services Required    Requested Specialty:   Pulmonary Disease    Number of Visits Requested:   1    I have left a telephone voice message to the patient use Metamucil on a daily basis for constipation and to call his GI for refill of his GI medications if needed.

## 2022-03-07 ENCOUNTER — Encounter: Payer: Self-pay | Admitting: Internal Medicine

## 2022-03-07 ENCOUNTER — Ambulatory Visit: Payer: 59 | Admitting: Internal Medicine

## 2022-03-07 VITALS — BP 100/58 | HR 65 | Ht 68.0 in | Wt 187.0 lb

## 2022-03-07 DIAGNOSIS — J449 Chronic obstructive pulmonary disease, unspecified: Secondary | ICD-10-CM

## 2022-03-07 DIAGNOSIS — J309 Allergic rhinitis, unspecified: Secondary | ICD-10-CM | POA: Diagnosis not present

## 2022-03-07 DIAGNOSIS — F17200 Nicotine dependence, unspecified, uncomplicated: Secondary | ICD-10-CM | POA: Diagnosis not present

## 2022-03-07 DIAGNOSIS — J4489 Other specified chronic obstructive pulmonary disease: Secondary | ICD-10-CM

## 2022-03-07 MED ORDER — CETIRIZINE HCL 10 MG PO TABS: 10.0000 mg | ORAL_TABLET | Freq: Every day | ORAL | 5 refills | Status: DC

## 2022-03-07 MED ORDER — ANORO ELLIPTA 62.5-25 MCG/ACT IN AEPB: 1.0000 | INHALATION_SPRAY | Freq: Every day | RESPIRATORY_TRACT | 5 refills | Status: DC

## 2022-03-07 NOTE — Progress Notes (Signed)
[name]    [id number]    05/17/1966  Primary Care Physician:Paterson, Barry Dienes, MD  Referring Physician: Rayford Halsted, PA-C No address on file Reason for Consultation: shortness of breath Date of Consultation: 03/07/2022  Chief complaint:   Chief Complaint  Patient presents with   Consult    Pt consult on SOB, wheezing, began as nasal congestion but is now a productive cough. Pt does use flonase in the evening.     HPI: [name] is a 56 y.o. man with history of CAD s/p CABG and tobacco use disorder who presents for new patient evaluation of shortness of breath.  His symptoms started with getting german shepherds this past year. He had nasal congestion with this. Got his home cleaned and had new HVAC installed and now nasal congestion is better. Takes flonase at night which does help. Stopping afrin also helped.   He does smoke cigarettes. Has cut back from 2 ppd down to 1 ppd.   He has daily chronic cough with sputum production. No hemoptysis. Has shortness of breath with exertion, wheezing.   Has an albuterol inhaler. He takes this every night before he goes to bed to help with wheezing. The symptoms improve when he lays on his right side - wheezes more on his left side.   Dyspnea and cough does not wake him up at night.   He recently had a sleep study to be treated with an oral appliance. He is following with [facility] for this. He is having benefit from this device.   Social history:  Occupation: Patent examiner Exposures: lives at home with dogs  Smoking history: 2 ppd x 40 years = 80 pack years now down to 1 ppd Quit cold [place] during his cabg for about 2 years, picked up when he got divorce.   Social History   Occupational History   Not on file  Tobacco Use   Smoking status: Some Days    Packs/day: 0.25    Years: 34.00    Total pack years: 8.50    Types: Cigarettes   Smokeless tobacco: Never  Vaping Use   Vaping  Use: Never used  Substance and Sexual Activity   Alcohol use: Yes    Alcohol/week: 0.0 standard drinks of alcohol    Comment: RARE   Drug use: No   Sexual activity: Not on file    Relevant family history:  Family History  Problem Relation Age of Onset   Stomach cancer Mother    Cancer Mother    Prostate cancer Father    Heart disease Father    Cancer - Prostate Father    Heart disease Brother    Asthma Daughter    Healthy Daughter    Asthma Daughter     Past Medical History:  Diagnosis Date   Anginal pain (HCC)    Anxiety    Arthritis    RA IN HANDS   Asthma    CAD (coronary artery disease) 04/27/2014   Chronic renal disease, stage 3, moderately decreased glomerular filtration rate between 30-59 mL/min/1.73 square meter (HCC) 05/03/2014   COPD (chronic obstructive pulmonary disease) (HCC)    Coronary artery disease    Diabetes (HCC) 05/03/2014   Diabetes mellitus without complication (HCC)    type 1   Hyperlipidemia    Left shoulder pain    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/03/2014   Shortness of breath  Sleep apnea    mild OSA, no CPAP   Unstable angina pectoris (HCC) 04/25/2014    Past Surgical History:  Procedure Laterality Date   CARDIAC CATHETERIZATION  04/25/2014   BY DR Jacinto Halim   CARDIAC CATHETERIZATION N/A 10/30/2015   Procedure: Left Heart Cath and Cors/Grafts Angiography;  Surgeon: Yates Decamp, MD;  Location: [facility] INVASIVE CV LAB;  Service: Cardiovascular;  Laterality: N/A;   CARPAL TUNNEL RELEASE     CATARACT EXTRACTION, BILATERAL     CORONARY ARTERY BYPASS GRAFT N/A 04/27/2014   Procedure: CORONARY ARTERY BYPASS GRAFTING on pump using left internal mammary artery to LAD coronary artery, right great saphenous vein graft to diagonal coronary artery with sequential to OM1 and circumflex coronary arteries. Right greater saphenous vein graft to posterior descending coronary artery. ;  Surgeon: Delight Ovens, MD;  Location: [facility] OR;  Service:  Open Heart Surgery;  Laterality: N   elbow drained Left 05/09/2019   ENDOVEIN HARVEST OF GREATER SAPHENOUS VEIN Right 04/27/2014   Procedure: ENDOVEIN HARVEST OF GREATER SAPHENOUS VEIN;  Surgeon: Delight Ovens, MD;  Location: [facility] OR;  Service: Open Heart Surgery;  Laterality: Right;   EXCISION ORAL TUMOR N/A 03/01/2018   Procedure: EXCISION ORAL TUMOR;  Surgeon: Christia Reading, MD;  Location: [facility];  Service: ENT;  Laterality: N/A;   EYE SURGERY     LASER   EYE SURGERY Bilateral    astigmatism correction    HARDWARE REMOVAL Left 10/03/2020   Procedure: LEFT FOOT REMOVAL HARDWARE, PLACE VANC POWDER;  Surgeon: Nadara Mustard, MD;  Location: [facility] OR;  Service: Orthopedics;  Laterality: Left;   INTRAOPERATIVE TRANSESOPHAGEAL ECHOCARDIOGRAM N/A 04/27/2014   Procedure: INTRAOPERATIVE TRANSESOPHAGEAL ECHOCARDIOGRAM;  Surgeon: Delight Ovens, MD;  Location: [facility] OR;  Service: Open Heart Surgery;  Laterality: N/A;   LEFT HEART CATHETERIZATION WITH CORONARY ANGIOGRAM N/A 04/25/2014   Procedure: LEFT HEART CATHETERIZATION WITH CORONARY ANGIOGRAM;  Surgeon: Pamella Pert, MD;  Location: [facility] CATH LAB;  Service: Cardiovascular;  Laterality: N/A;   MOUTH SURGERY  11/15/2017   tongue surgery    ORIF TOE FRACTURE Left 03/22/2020   Procedure: OPEN REDUCTION INTERNAL FIXATION (ORIF) Non Union 5th Metatarsal;  Surgeon: Kathryne Hitch, MD;  Location: [facility];  Service: Orthopedics;  Laterality: Left;     Physical Exam: Blood pressure (!) 100/58, pulse 65, height 5\' 8"  (1.727 m), weight 187 lb (84.8 kg), SpO2 94 %. Gen:      No acute distress ENT:  mild nasal debris, no nasal polyps, mucus membranes moist Lungs:    No increased respiratory effort, symmetric chest wall excursion, clear to auscultation bilaterally, no wheezes or crackles, few rhonchi that clear with coughing CV:         Regular rate and rhythm; no murmurs, rubs, or gallops.  No pedal edema Abd:      +  bowel sounds; soft, non-tender; no distension MSK: no acute synovitis of DIP or PIP joints, no mechanics hands.  Skin:      Warm and dry; no rashes Neuro: normal speech, no focal facial asymmetry Psych: alert and oriented x3, normal mood and affect   Data Reviewed/Medical Decision Making:  Independent interpretation of tests: Imaging:  Split night study done 2018 Had REM AHI of 14/hr.   PFTs: I have personally reviewed the patient's PFTs and normal spirometry in 2015    Latest Ref Rng & Units 04/25/2014    2:13 PM  PFT Results  FVC-Pre  L 2.83  P  FVC-Predicted Pre % 59  P  FVC-Post L 2.65  P  FVC-Predicted Post % 56  P  Pre FEV1/FVC % % 76  P  Post FEV1/FCV % % 79  P  FEV1-Pre L 2.14  P  FEV1-Predicted Pre % 58  P  FEV1-Post L 2.08  P    P Preliminary result    Labs:  Lab Results  Component Value Date   WBC 12.9 (H) 10/03/2020   HGB 11.3 (L) 04/01/2021   HCT 33.9 (L) 04/01/2021   MCV 96.6 10/03/2020   PLT 275 10/03/2020   Lab Results  Component Value Date   NA 135 10/03/2020   K 4.4 10/03/2020   CL 104 10/03/2020   CO2 23 10/03/2020     Immunization status:   There is no immunization history on file for this patient.   I reviewed prior external note(s) from PCP, cardiology  I reviewed the result(s) of the labs and imaging as noted above.   I have ordered pft, allergy testing  Assessment:  COPD - new diagnosis Chronic allergic rhinitis Need for lung cancer screening Tobacco use disorder OSA using oral appliance   Plan/Recommendations:  PFTs - 1 hour. Before next visit.  Region 2 allergy panel.   Start taking anoro inhaler 1 puff once a day.  Continue albuterol inhaler as needed.   You can continue the wellbutrin and increase to twice a day to help with quitting smoking.   I will refer you to our lung cancer screening program.   Continue flonase for nasal congestion. Start taking cetirizine for nasal congestion   We discussed disease  management and progression at length today, regarding new diagnosis COPD.     Return to Care: Return in about 3 months (around 06/07/2022).  Durel Salts, MD Pulmonary and Critical Care Medicine [facility] [phone]  CC: [place], [place], [place]*

## 2022-03-07 NOTE — Patient Instructions (Addendum)
Please schedule follow up scheduled with myself in 3 months.  If my schedule is not open yet, we will contact you with a reminder closer to that time. Please call [phone] if you haven't heard from [place] a month before.   Before your next visit I would like you to have: PFTs - 1 hour. Before next visit.  Get allergy testing on your way out today  Start taking anoro inhaler 1 puff once a day.   You can continue the wellbutrin and increase to twice a day to help with quitting smoking. This is the most important thing you can do for your health!  Continue albuterol inhaler as needed.  I will refer you to our lung cancer screening program.   Continue flonase for nasal congestion. Start taking cetirizine for nasal congestion  Understanding COPD   What is COPD? COPD stands for chronic obstructive pulmonary (lung) disease. COPD is a general term used for several lung diseases.  COPD is an umbrella term and encompasses other  common diseases in this group like chronic bronchitis and emphysema. Chronic asthma may also be included in this group. While some patients with COPD have only chronic bronchitis or emphysema, most patients have a combination of both.  You might hear these terms used in exchange for one another.   COPD adds to the work of the heart. Diseased lungs may reduce the amount of oxygen that goes to the blood. High blood pressure in blood vessels from the heart to the lungs makes it difficult for the heart to pump. Lung disease can also cause the body to produce too many red blood cells which may make the blood thicker and harder to pump.   Patients who have COPD with low oxygen levels may develop an enlarged heart (cor pulmonale). This condition weakens the heart and causes increased shortness of breath and swelling in the legs and feet.   Chronic bronchitis Chronic bronchitis is irritation and inflammation (swelling) of the lining in the bronchial tubes (air passages). The  irritation causes coughing and an excess amount of mucus in the airways. The swelling makes it difficult to get air in and out of the lungs. The small, hair-like structures on the inside of the airways (called cilia) may be damaged by the irritation. The cilia are then unable to help clean mucus from the airways.  Bronchitis is generally considered to be chronic when you have: a productive cough (cough up mucus) and shortness of breath that lasts about 3 months or more each year for 2 or more years in a row. Your doctor may define chronic bronchitis differently.   Emphysema Emphysema is the destruction, or breakdown, of the walls of the alveoli (air sacs) located at the end of the bronchial tubes. The damaged alveoli are not able to exchange oxygen and carbon dioxide between the lungs and the blood. The bronchioles lose their elasticity and collapse when you exhale, trapping air in the lungs. The trapped air keeps fresh air and oxygen from entering the lungs.   Who is affected by COPD? Emphysema and chronic bronchitis affect approximately 16 million people in the [place], or close to 11 percent of the population.   Symptoms of COPD  Shortness of breath  Shortness of breath with mild exercise (walking, using the stairs, etc.)  Chronic, productive cough (with mucus)  A feeling of "tightness" in the chest  Wheezing   What causes COPD? The two primary causes of COPD are cigarette smoking and  alpha1-antitrypsin (AAT) deficiency. Air pollution and occupational dusts may also contribute to COPD, especially when the person exposed to these substances is a cigarette smoker.  Cigarette smoke causes COPD by irritating the airways and creating inflammation that narrows the airways, making it more difficult to breathe. Cigarette smoke also causes the cilia to stop working properly so mucus and trapped particles are not cleaned from the airways. As a result, chronic cough and excess mucus production  develop, leading to chronic bronchitis.  In some people, chronic bronchitis and infections can lead to destruction of the small airways, or emphysema.  AAT deficiency, an inherited disorder, can also lead to emphysema. Alpha antitrypsin (AAT) is a protective material produced in the liver and transported to the lungs to help combat inflammation. When there is not enough of the chemical AAT, the body is no longer protected from an enzyme in the white blood cells.   How is COPD diagnosed?  To diagnose COPD, the physician needs to know: Do you smoke?  Have you had chronic exposure to dust or air pollutants?  Do other members of your family have lung disease?  Are you short of breath?  Do you get short of breath with exercise?  Do you have chronic cough and/or wheezing?  Do you cough up excess mucus?  To help with the diagnosis, the physician will conduct a thorough physical exam which includes:  Listening to your lungs and heart  Checking your blood pressure and pulse  Examining your nose and throat  Checking your feet and ankles for swelling   Laboratory and other tests Several laboratory and other tests are needed to confirm a diagnosis of COPD. These tests may include:  Chest X-ray to look for lung changes that could be caused by COPD   Spirometry and pulmonary function tests (PFTs) to determine lung volume and air flow  Pulse oximetry to measure the saturation of oxygen in the blood  Arterial blood gases (ABGs) to determine the amount of oxygen and carbon dioxide in the blood  Exercise testing to determine if the oxygen level in the blood drops during exercise   Treatment In the beginning stages of COPD, there is minimal shortness of breath that may be noticed only during exercise. As the disease progresses, shortness of breath may worsen and you may need to wear an oxygen device.   To help control other symptoms of COPD, the following treatments and lifestyle changes may be  prescribed.  Quitting smoking  Avoiding cigarette smoke and other irritants  Taking medications including: a. bronchodilators b. anti-inflammatory agents c. oxygen d. antibiotics  Maintaining a healthy diet  Following a structured exercise program such as pulmonary rehabilitation Preventing respiratory infections  Controlling stress   If your COPD progresses, you may be eligible to be evaluated for lung volume reduction surgery or lung transplantation. You may also be eligible to participate in certain clinical trials (research studies). Ask your health care providers about studies being conducted in your hospital.   What is the outlook? Although COPD can not be cured, its symptoms can be treated and your quality of life can be improved. Your prognosis or outlook for the future will depend on how well your lungs are functioning, your symptoms, and how well you respond to and follow your treatment plan.

## 2022-03-10 ENCOUNTER — Encounter: Payer: Self-pay | Admitting: Internal Medicine

## 2022-03-10 LAB — RESPIRATORY ALLERGY PROFILE REGION II ~~LOC~~
Allergen, A. alternata, m6: <0.1 kU/L
Allergen, Cedar tree, t12: <0.1 kU/L
Allergen, Comm Silver Birch, t9: <0.1 kU/L
Allergen, Cottonwood, t14: <0.1 kU/L
Allergen, D pternoyssinus,d7: <0.1 kU/L
Allergen, Mouse Urine Protein, e78: <0.1 kU/L
Allergen, Mulberry, t76: <0.1 kU/L
Allergen, Oak,t7: <0.1 kU/L
Allergen, P. notatum, m1: <0.1 kU/L
Aspergillus fumigatus, m3: <0.1 kU/L
Bermuda Grass: <0.1 kU/L
Box Elder IgE: <0.1 kU/L
CLADOSPORIUM HERBARUM (M2) IGE: <0.1 kU/L
COMMON RAGWEED (SHORT) (W1) IGE: 0.75 kU/L — ABNORMAL HIGH
Cat Dander: 0.42 kU/L — ABNORMAL HIGH
Class: 0
Class: 0
Class: 0
Class: 0
Class: 0
Class: 0
Class: 0
Class: 0
Class: 0
Class: 0
Class: 0
Class: 0
Class: 0
Class: 0
Class: 0
Class: 0
Class: 0
Class: 0
Class: 0
Class: 0
Class: 0
Class: 0
Class: 1
Class: 2
Cockroach: <0.1 kU/L
D. farinae: <0.1 kU/L
Dog Dander: <0.1 kU/L
Elm IgE: <0.1 kU/L
IgE (Immunoglobulin E), Serum: 283 kU/L — ABNORMAL HIGH (ref <=114)
Johnson Grass: <0.1 kU/L
Pecan/Hickory Tree IgE: <0.1 kU/L
Rough Pigweed  IgE: <0.1 kU/L
Sheep Sorrel IgE: <0.1 kU/L
Timothy Grass: <0.1 kU/L

## 2022-03-10 LAB — INTERPRETATION:

## 2022-03-11 ENCOUNTER — Other Ambulatory Visit (HOSPITAL_COMMUNITY): Payer: Self-pay

## 2022-03-11 NOTE — Telephone Encounter (Signed)
Dr. Celine Mans, pt states the Anoro is too expensive ($50/month). Pharmacy states Stiolto and [place] are cheaper. Would you want to change pt or keep pt on Anoro and try for pt assistance? Thanks.

## 2022-03-11 NOTE — Telephone Encounter (Signed)
Pharmacy, are there any cheaper alternatives to Anoro? Thanks so much!!

## 2022-03-12 NOTE — Telephone Encounter (Signed)
Sounds like getting him on stiolto with the patient savings card is the best option.

## 2022-03-21 MED ORDER — STIOLTO RESPIMAT 2.5-2.5 MCG/ACT IN AERS: 2.0000 | INHALATION_SPRAY | Freq: Every day | RESPIRATORY_TRACT | 3 refills | Status: DC

## 2022-03-27 DIAGNOSIS — G4733 Obstructive sleep apnea (adult) (pediatric): Secondary | ICD-10-CM | POA: Insufficient documentation

## 2022-04-03 ENCOUNTER — Encounter: Payer: Self-pay | Admitting: Podiatry

## 2022-04-03 ENCOUNTER — Ambulatory Visit: Payer: 59 | Admitting: Podiatry

## 2022-04-03 DIAGNOSIS — B351 Tinea unguium: Secondary | ICD-10-CM | POA: Diagnosis not present

## 2022-04-03 DIAGNOSIS — E1042 Type 1 diabetes mellitus with diabetic polyneuropathy: Secondary | ICD-10-CM

## 2022-04-03 DIAGNOSIS — I739 Peripheral vascular disease, unspecified: Secondary | ICD-10-CM

## 2022-04-03 DIAGNOSIS — M79676 Pain in unspecified toe(s): Secondary | ICD-10-CM

## 2022-04-03 DIAGNOSIS — L603 Nail dystrophy: Secondary | ICD-10-CM

## 2022-04-03 NOTE — Progress Notes (Signed)
He presents today chief complaint of painful elongated toenails.  Objective: Pulses are palpable.  Toenails are long thick yellow dystrophic clinically mycotic.  No open lesions or wounds.  Assessment: Pain in limb secondary onychomycosis.  Plan: Debridement of toenails 1 through 5 bilateral.  We did take samples of the toenails today for pathologic evaluation we will discuss them with him and his neck scheduled appointment.

## 2022-04-17 ENCOUNTER — Other Ambulatory Visit: Payer: Self-pay

## 2022-04-17 DIAGNOSIS — Z87891 Personal history of nicotine dependence: Secondary | ICD-10-CM

## 2022-04-17 DIAGNOSIS — Z122 Encounter for screening for malignant neoplasm of respiratory organs: Secondary | ICD-10-CM

## 2022-04-17 DIAGNOSIS — F17210 Nicotine dependence, cigarettes, uncomplicated: Secondary | ICD-10-CM

## 2022-04-18 ENCOUNTER — Other Ambulatory Visit: Payer: Self-pay

## 2022-04-18 MED ORDER — SIMVASTATIN 20 MG PO TABS: 20.0000 mg | ORAL_TABLET | Freq: Every day | ORAL | 3 refills | Status: DC

## 2022-04-30 ENCOUNTER — Other Ambulatory Visit: Payer: Self-pay

## 2022-04-30 MED ORDER — SIMVASTATIN 20 MG PO TABS: 20.0000 mg | ORAL_TABLET | Freq: Every day | ORAL | 3 refills | Status: DC

## 2022-05-06 ENCOUNTER — Encounter: Payer: Self-pay | Admitting: Podiatry

## 2022-05-07 MED ORDER — CEPHALEXIN 500 MG PO CAPS: 500.0000 mg | ORAL_CAPSULE | Freq: Three times a day (TID) | ORAL | 0 refills | Status: DC

## 2022-05-07 NOTE — Addendum Note (Signed)
Addended by: Clovis Riley E on: 05/07/2022 12:08 PM   Modules accepted: Orders

## 2022-05-16 ENCOUNTER — Ambulatory Visit (INDEPENDENT_AMBULATORY_CARE_PROVIDER_SITE_OTHER): Payer: 59 | Admitting: Pulmonary Disease

## 2022-05-16 ENCOUNTER — Encounter: Payer: Self-pay | Admitting: Pulmonary Disease

## 2022-05-16 DIAGNOSIS — Z122 Encounter for screening for malignant neoplasm of respiratory organs: Secondary | ICD-10-CM

## 2022-05-16 DIAGNOSIS — Z87891 Personal history of nicotine dependence: Secondary | ICD-10-CM | POA: Diagnosis not present

## 2022-05-16 NOTE — Progress Notes (Signed)
Shared Decision Making Visit Lung Cancer Screening Program [phone])   Eligibility: Age 56 y.o. Pack Years Smoking History Calculation 39 (# packs/per year x # years smoked) Recent History of coughing up blood  no Unexplained weight loss? no ( >Than 15 pounds within the last 6 months ) Prior History Lung / other cancer no (Diagnosis within the last 5 years already requiring surveillance chest CT Scans). Smoking Status Current Smoker  Visit Components: Discussion included one or more decision making aids. yes Discussion included risk/benefits of screening. yes Discussion included potential follow up diagnostic testing for abnormal scans. yes Discussion included meaning and risk of over diagnosis. yes Discussion included meaning and risk of False Positives. yes Discussion included meaning of total radiation exposure. yes  Counseling Included: Importance of adherence to annual lung cancer LDCT screening. yes Impact of comorbidities on ability to participate in the program. yes Ability and willingness to under diagnostic treatment. yes  Smoking Cessation Counseling: Current Smokers:  Discussed importance of smoking cessation. yes Information about tobacco cessation classes and interventions provided to patient. yes Patient provided with "ticket" for LDCT Scan. yes Asymptomatic Patient yes  Counseling (Intermediate counseling: > three minutes counseling) P1025 - Had open heart surgery a few years ago. Stopped smoking for 3 years. Pt then got divorced and resumed smoking. Stress is a trigger. Discussed reduce to quite method and pt will also review resources on AVS.   Information about tobacco cessation classes and interventions provided to patient. Yes Patient provided with "ticket" for LDCT Scan. yes Written Order for Lung Cancer Screening with LDCT placed in Epic. Yes (CT Chest Lung Cancer Screening Low Dose W/O CM) [id number] Z12.2-Screening of respiratory organs Z87.891-Personal  history of nicotine dependence   Lauraine Rinne, NP

## 2022-05-16 NOTE — Patient Instructions (Signed)
Thank you for participating in the [facility] Lung Cancer Screening Program. It was our pleasure to meet you today. We will call you with the results of your scan within the next few days. Your scan will be assigned a Lung RADS category score by the physicians reading the scans.  This Lung RADS score determines follow up scanning.  See below for description of categories, and follow up screening recommendations. We will be in touch to schedule your follow up screening annually or based on recommendations of our providers. We will fax a copy of your scan results to your Primary Care Physician, or the physician who referred you to the program, to ensure they have the results. Please call the office if you have any questions or concerns regarding your scanning experience or results.  Our office number is [phone]. Please speak with Denise Phelps, RN. , or  Denise Buckner RN, They are  our Lung Cancer Screening RN.'s If They are unavailable when you call, Please leave a message on the voice mail. We will return your call at our earliest convenience.This voice mail is monitored several times a day.  Remember, if your scan is normal, we will scan you annually as long as you continue to meet the criteria for the program. (Age 55-77, Current smoker or smoker who has quit within the last 15 years). If you are a smoker, remember, quitting is the single most powerful action that you can take to decrease your risk of lung cancer and other pulmonary, breathing related problems. We know quitting is hard, and we are here to help.  Please let us know if there is anything we can do to help you meet your goal of quitting. If you are a former smoker, congratulations. We are proud of you! Remain smoke free! Remember you can refer friends or family members through the number above.  We will screen them to make sure they meet criteria for the program. Thank you for helping us take better care of you by  participating in Lung Screening.  You can receive free nicotine replacement therapy ( patches, gum or mints) by calling 1-800-QUIT NOW. Please call so we can get you on the path to becoming  a non-smoker. I know it is hard, but you can do this!  Lung RADS Categories:  Lung RADS 1: no nodules or definitely non-concerning nodules.  Recommendation is for a repeat annual scan in 12 months.  Lung RADS 2:  nodules that are non-concerning in appearance and behavior with a very low likelihood of becoming an active cancer. Recommendation is for a repeat annual scan in 12 months.  Lung RADS 3: nodules that are probably non-concerning , includes nodules with a low likelihood of becoming an active cancer.  Recommendation is for a 6-month repeat screening scan. Often noted after an upper respiratory illness. We will be in touch to make sure you have no questions, and to schedule your 6-month scan.  Lung RADS 4 A: nodules with concerning findings, recommendation is most often for a follow up scan in 3 months or additional testing based on our provider's assessment of the scan. We will be in touch to make sure you have no questions and to schedule the recommended 3 month follow up scan.  Lung RADS 4 B:  indicates findings that are concerning. We will be in touch with you to schedule additional diagnostic testing based on our provider's  assessment of the scan.  Other options for assistance in smoking cessation (   As covered by your insurance benefits)  Hypnosis for smoking cessation  [employer] [phone]  Acupuncture for smoking cessation  [facility] [phone]   

## 2022-05-19 ENCOUNTER — Ambulatory Visit
Admission: RE | Admit: 2022-05-19 | Discharge: 2022-05-19 | Disposition: A | Discharge status: Home / Self Care | Payer: 59 | Source: Ambulatory Visit | Attending: Internal Medicine | Admitting: Internal Medicine

## 2022-05-19 DIAGNOSIS — Z87891 Personal history of nicotine dependence: Secondary | ICD-10-CM

## 2022-05-19 DIAGNOSIS — Z122 Encounter for screening for malignant neoplasm of respiratory organs: Secondary | ICD-10-CM

## 2022-05-19 DIAGNOSIS — F17210 Nicotine dependence, cigarettes, uncomplicated: Secondary | ICD-10-CM

## 2022-05-20 ENCOUNTER — Other Ambulatory Visit: Payer: Self-pay

## 2022-05-20 DIAGNOSIS — Z87891 Personal history of nicotine dependence: Secondary | ICD-10-CM

## 2022-05-20 DIAGNOSIS — F17210 Nicotine dependence, cigarettes, uncomplicated: Secondary | ICD-10-CM

## 2022-05-20 DIAGNOSIS — Z122 Encounter for screening for malignant neoplasm of respiratory organs: Secondary | ICD-10-CM

## 2022-05-26 ENCOUNTER — Encounter: Payer: Self-pay | Admitting: Internal Medicine

## 2022-05-26 ENCOUNTER — Ambulatory Visit: Payer: 59

## 2022-05-26 ENCOUNTER — Ambulatory Visit: Payer: 59 | Admitting: Student

## 2022-05-26 ENCOUNTER — Ambulatory Visit (INDEPENDENT_AMBULATORY_CARE_PROVIDER_SITE_OTHER): Payer: 59 | Admitting: Internal Medicine

## 2022-05-26 ENCOUNTER — Ambulatory Visit: Payer: 59 | Admitting: Internal Medicine

## 2022-05-26 VITALS — BP 120/58 | HR 57 | Temp 97.7°F | Ht 68.0 in | Wt 195.0 lb

## 2022-05-26 VITALS — BP 142/70 | HR 60 | Temp 97.0°F | Resp 16 | Ht 68.0 in | Wt 190.0 lb

## 2022-05-26 DIAGNOSIS — R0609 Other forms of dyspnea: Secondary | ICD-10-CM

## 2022-05-26 DIAGNOSIS — F17200 Nicotine dependence, unspecified, uncomplicated: Secondary | ICD-10-CM

## 2022-05-26 DIAGNOSIS — J4489 Other specified chronic obstructive pulmonary disease: Secondary | ICD-10-CM | POA: Diagnosis not present

## 2022-05-26 DIAGNOSIS — J439 Emphysema, unspecified: Secondary | ICD-10-CM | POA: Diagnosis not present

## 2022-05-26 DIAGNOSIS — Z87891 Personal history of nicotine dependence: Secondary | ICD-10-CM | POA: Diagnosis not present

## 2022-05-26 DIAGNOSIS — J449 Chronic obstructive pulmonary disease, unspecified: Secondary | ICD-10-CM

## 2022-05-26 DIAGNOSIS — I2510 Atherosclerotic heart disease of native coronary artery without angina pectoris: Secondary | ICD-10-CM

## 2022-05-26 DIAGNOSIS — I6523 Occlusion and stenosis of bilateral carotid arteries: Secondary | ICD-10-CM

## 2022-05-26 LAB — PULMONARY FUNCTION TEST
DL/VA % pred: 97 %
DL/VA: 4.19 ml/min/mmHg/L
DLCO cor % pred: 73 %
DLCO cor: 19.35 ml/min/mmHg
DLCO unc % pred: 73 %
DLCO unc: 19.35 ml/min/mmHg
FEF 25-75 Post: 1.17 L/sec
FEF 25-75 Pre: 0.89 L/sec
FEF2575-%Change-Post: 31 %
FEF2575-%Pred-Post: 40 %
FEF2575-%Pred-Pre: 30 %
FEV1-%Change-Post: 7 %
FEV1-%Pred-Post: 55 %
FEV1-%Pred-Pre: 51 %
FEV1-Post: 1.9 L
FEV1-Pre: 1.77 L
FEV1FVC-%Change-Post: 2 %
FEV1FVC-%Pred-Pre: 85 %
FEV6-%Change-Post: 5 %
FEV6-%Pred-Post: 66 %
FEV6-%Pred-Pre: 63 %
FEV6-Post: 2.85 L
FEV6-Pre: 2.71 L
FEV6FVC-%Change-Post: 0 %
FEV6FVC-%Pred-Post: 104 %
FEV6FVC-%Pred-Pre: 104 %
FVC-%Change-Post: 5 %
FVC-%Pred-Post: 63 %
FVC-%Pred-Pre: 60 %
FVC-Post: 2.85 L
FVC-Pre: 2.72 L
Post FEV1/FVC ratio: 66 %
Post FEV6/FVC ratio: 100 %
Pre FEV1/FVC ratio: 65 %
Pre FEV6/FVC Ratio: 100 %
RV % pred: 136 %
RV: 2.85 L
TLC % pred: 95 %
TLC: 6.27 L

## 2022-05-26 MED ORDER — BUDESONIDE-FORMOTEROL FUMARATE 80-4.5 MCG/ACT IN AERO: 2.0000 | INHALATION_SPRAY | Freq: Two times a day (BID) | RESPIRATORY_TRACT | 5 refills | Status: DC

## 2022-05-26 MED ORDER — ALBUTEROL SULFATE HFA 108 (90 BASE) MCG/ACT IN AERS: 2.0000 | INHALATION_SPRAY | Freq: Four times a day (QID) | RESPIRATORY_TRACT | 5 refills | Status: DC | PRN

## 2022-05-26 NOTE — Patient Instructions (Signed)
Full PFT performed today. °

## 2022-05-26 NOTE — Progress Notes (Signed)
Primary Physician:  Donnajean Lopes, MD  Patient ID: [name], male    DOB: 06-Jun-1966, 56 y.o.   MRN: [id number]  Subjective:   Chief Complaint  Patient presents with   Coronary Artery Disease   Hyperlipidemia   Follow-up    6 month    HPI: [name]  is a 57 y.o. male  with history of Type I Diabetes, rheumatoid arthritis, hyperlipidemia, and CAD S/P coronary artery bypass grafting on 04/27/2014.  Patient with history of GI bleed 03/2021, at which time aspirin was held.  Patient presents today for 6 month follow-up. Overall, he is doing well without any complaints.  He has not had recurrence of chest pain and has not taken sublingual nitroglycerin.  He continues smoking cigarettes approximately 1 pack/day.  He does want to stop smoking and is working on cutting back. He is not currently exercising and does report that diet could be better. He denies chest pain, worsening shortness of breath, palpitations, leg edema, orthopnea, PND, syncope.   Past Medical History:  Diagnosis Date   Anginal pain ([name])    Anxiety    Arthritis    RA IN HANDS   Asthma    CAD (coronary artery disease) 04/27/2014   Chronic renal disease, stage 3, moderately decreased glomerular filtration rate between 30-59 mL/min/1.73 square meter ([place]) 05/03/2014   COPD (chronic obstructive pulmonary disease) (HCC)    Coronary artery disease    Diabetes ([place]) 05/03/2014   Diabetes mellitus without complication (HCC)    type 1   Hyperlipidemia    Left shoulder pain    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/03/2014   Shortness of breath    Sleep apnea    mild OSA, no CPAP   Unstable angina pectoris ([place]) 04/25/2014    Past Surgical History:  Procedure Laterality Date   CARDIAC CATHETERIZATION  04/25/2014   BY DR Honor N/A 10/30/2015   Procedure: Left Heart Cath and Cors/Grafts Angiography;  Surgeon: Adrian Prows, MD;  Location: [facility];  Service:  Cardiovascular;  Laterality: N/A;   CARPAL TUNNEL RELEASE     CATARACT EXTRACTION, BILATERAL     CORONARY ARTERY BYPASS GRAFT N/A 04/27/2014   Procedure: CORONARY ARTERY BYPASS GRAFTING on pump using left internal mammary artery to LAD coronary artery, right great saphenous vein graft to diagonal coronary artery with sequential to OM1 and circumflex coronary arteries. Right greater saphenous vein graft to posterior descending coronary artery. ;  Surgeon: Grace Isaac, MD;  Location: [facility];  Service: Open Heart Surgery;  Laterality: N   elbow drained Left 05/09/2019   ENDOVEIN HARVEST OF GREATER SAPHENOUS VEIN Right 04/27/2014   Procedure: ENDOVEIN HARVEST OF GREATER SAPHENOUS VEIN;  Surgeon: Grace Isaac, MD;  Location: [facility];  Service: Open Heart Surgery;  Laterality: Right;   EXCISION ORAL TUMOR N/A 03/01/2018   Procedure: EXCISION ORAL TUMOR;  Surgeon: Melida Quitter, MD;  Location: [facility];  Service: ENT;  Laterality: N/A;   EYE SURGERY     LASER   EYE SURGERY Bilateral    astigmatism correction    HARDWARE REMOVAL Left 10/03/2020   Procedure: LEFT FOOT REMOVAL HARDWARE, PLACE VANC POWDER;  Surgeon: Newt Minion, MD;  Location: [facility];  Service: Orthopedics;  Laterality: Left;   INTRAOPERATIVE TRANSESOPHAGEAL ECHOCARDIOGRAM N/A 04/27/2014   Procedure: INTRAOPERATIVE TRANSESOPHAGEAL ECHOCARDIOGRAM;  Surgeon: Grace Isaac, MD;  Location: [facility];  Service: Open  Heart Surgery;  Laterality: N/A;   LEFT HEART CATHETERIZATION WITH CORONARY ANGIOGRAM N/A 04/25/2014   Procedure: LEFT HEART CATHETERIZATION WITH CORONARY ANGIOGRAM;  Surgeon: Laverda Page, MD;  Location: [facility];  Service: Cardiovascular;  Laterality: N/A;   MOUTH SURGERY  11/15/2017   tongue surgery    ORIF TOE FRACTURE Left 03/22/2020   Procedure: OPEN REDUCTION INTERNAL FIXATION (ORIF) Non Union 5th Metatarsal;  Surgeon: Mcarthur Rossetti, MD;  Location: [facility];  Service:  Orthopedics;  Laterality: Left;   Family History  Problem Relation Age of Onset   Stomach cancer Mother    Cancer Mother    Prostate cancer Father    Heart disease Father    Cancer - Prostate Father    Heart disease Brother    Asthma Daughter    Healthy Daughter    Asthma Daughter     Social History   Tobacco Use   Smoking status: Some Days    Packs/day: 0.25    Years: 34.00    Total pack years: 8.50    Types: Cigarettes   Smokeless tobacco: Never  Substance Use Topics   Alcohol use: Yes    Alcohol/week: 0.0 standard drinks of alcohol    Comment: RARE    Marital status: Divorced   Review of Systems  Constitutional: Negative for malaise/fatigue.  Cardiovascular:  Positive for dyspnea on exertion (chronic, stable). Negative for chest pain, claudication, leg swelling, near-syncope, orthopnea, palpitations, paroxysmal nocturnal dyspnea and syncope.  Respiratory:  Negative for shortness of breath.   Hematologic/Lymphatic: Does not bruise/bleed easily.  Musculoskeletal:  Positive for arthritis, back pain and neck pain.  Neurological:  Negative for dizziness and weakness.   Objective:  Blood pressure (!) 142/70, pulse 60, temperature (!) 97 F (36.1 C), temperature source Temporal, resp. rate 16, height _0  (1.727 m), weight 190 lb (86.2 kg), SpO2 96 %. Body mass index is 28.89 kg/m.      05/26/2022    9:42 AM 05/26/2022    9:13 AM 03/07/2022    1:33 PM  Vitals with BMI  Height  _1  _2   Weight  190 lbs 187 lbs  BMI  16.1 09.60  Systolic 454 098 119  Diastolic 70 70 58  Pulse  60 65     Physical Exam Vitals reviewed.  Constitutional:      Appearance: He is well-developed.  Cardiovascular:     Rate and Rhythm: Normal rate and regular rhythm.     Pulses: Intact distal pulses.          Carotid pulses are  on the right side with bruit and  on the left side with bruit.      Femoral pulses are 2+ on the right side and 2+ on the left side.      Popliteal  pulses are 2+ on the right side and 2+ on the left side.       Dorsalis pedis pulses are 1+ on the right side and 2+ on the left side.       Posterior tibial pulses are 1+ on the right side and 1+ on the left side.     Heart sounds: Normal heart sounds, S1 normal and S2 normal. No murmur heard.    No gallop.  Pulmonary:     Effort: Pulmonary effort is normal. No accessory muscle usage or respiratory distress.     Breath sounds: No wheezing or rhonchi.  Chest:     Chest wall:  No tenderness.  Musculoskeletal:     Right lower leg: No edema.     Left lower leg: No edema.  Neurological:     Mental Status: He is alert.    Laboratory examination:      Latest Ref Rng & Units 10/03/2020    6:45 AM 03/19/2020    2:57 PM 12/19/2019    9:16 AM  CMP  Glucose 70 - 99 mg/dL 169  228  89   BUN 6 - 20 mg/dL _0 Creatinine 0.61 - 1.24 mg/dL 1.52  1.38  1.54   Sodium 135 - 145 mmol/L 135  139  138   Potassium 3.5 - 5.1 mmol/L 4.4  4.9  4.6   Chloride 98 - 111 mmol/L 104  105  105   CO2 22 - 32 mmol/L _1 Calcium 8.9 - 10.3 mg/dL 8.6  8.9  9.0   Total Protein 6.1 - 8.1 g/dL 6.1 - 8.1 g/dL   6.5    6.5   Total Bilirubin 0.2 - 1.2 mg/dL   0.3   AST 10 - 35 U/L   37   ALT 9 - 46 U/L   40       Latest Ref Rng & Units 04/01/2021    3:37 PM 10/03/2020    6:45 AM 12/19/2019    9:16 AM  CBC  WBC 4.0 - 10.5 K/uL  12.9  10.5   Hemoglobin 13.0 - 17.7 g/dL 11.3  11.7  13.1   Hematocrit 37.5 - 51.0 % 33.9  36.6  39.4   Platelets 150 - 400 K/uL  275  251    Lipid Panel     Component Value Date/Time   CHOL 81 (L) 12/09/2021 1629   TRIG 77 12/09/2021 1629   HDL 28 (L) 12/09/2021 1629   CHOLHDL 1.9 02/10/2019 0907   LDLCALC 37 12/09/2021 1629   TSH No results for input(s): "TSH" in the last 8760 hours.  External labs: 04/14/2022: Hemoglobin A1c 6.5% Hemoglobin 12.3, hematocrit 39.0, platelets 264  01/17/2022: Sodium 134, potassium 5.5, glucose 154, BUN 19, creatinine 1.6 Sed  Rate 17, CRP <5.0  Allergies   Allergies  Allergen Reactions   Metoprolol Diarrhea   Niacin And Related     Other reaction(s): night sweats   Tramadol     Other reaction(s): SOB    Medications Prior to Visit:   Outpatient Medications Prior to Visit  Medication Sig Dispense Refill   albuterol (VENTOLIN HFA) 108 (90 Base) MCG/ACT inhaler Inhale 2 puffs into the lungs every 6 (six) hours as needed for wheezing.     Ascorbic Acid (VITAMIN C) 1000 MG tablet Take 1,000 mg by mouth daily.     bisoprolol (ZEBETA) 5 MG tablet TAKE ONE-HALF TABLET BY  MOUTH DAILY (Patient taking differently: Take 2.5 mg by mouth daily.) 45 tablet 3   Blood Glucose Monitoring Suppl (ONETOUCH VERIO FLEX SYSTEM) w/Device KIT USE TO CHECK BLOOD GLUCOSE UP TO 10 TIMES PER DAY     buPROPion (WELLBUTRIN SR) 150 MG 12 hr tablet TAKE 1 TABLET BY MOUTH  DAILY 90 tablet 3   Cholecalciferol (VITAMIN D3) 125 MCG (5000 UT) TABS Take 5,000 Units by mouth daily.     COD LIVER OIL PO Take 415 mg by mouth daily.     Continuous Blood Gluc Sensor (FREESTYLE LIBRE 14 DAY SENSOR) MISC CHANGE EVERY 14 DAYS TO MONITOR BLOOD GLUCOSE  Continuous Blood Gluc Sensor (FREESTYLE LIBRE 14 DAY SENSOR) MISC CHANGE EVERY 14 DAYS TO MONITOR BLOOD GLUOSE     gabapentin (NEURONTIN) 300 MG capsule Take 1 capsule (300 mg total) by mouth at bedtime. 30 capsule 2   HUMALOG 100 UNIT/ML injection Inject 15-20 Units into the skin 3 (three) times daily with meals. Sliding scale  Depending on carb intake     Hydrocortisone, Perianal, 1 % CREA SMARTSIG:Rectally 3 Times Daily PRN     insulin glargine (LANTUS) 100 UNIT/ML injection Inject 14-17 Units into the skin See admin instructions. Inject into the skin 17 units in the morning and 14 unit at bedtime     ipratropium (ATROVENT) 0.06 % nasal spray Place into both nostrils.     Lidocaine 4 % PTCH Place 1 patch onto the skin at bedtime.     LINZESS 145 MCG CAPS capsule Take 145 mcg by mouth every morning.      losartan (COZAAR) 25 MG tablet Take 25 mg by mouth daily.     magnesium gluconate (MAGONATE) 500 MG tablet Take 500 mg by mouth daily.     nitroGLYCERIN (NITROSTAT) 0.4 MG SL tablet DISSOLVE 1 TABLET UNDER THE TONGUE EVERY 5 MINUTES AS  NEEDED FOR CHEST PAIN. MAX  OF 3 TABLETS IN 15 MINUTES. CALL 911 IF PAIN PERSISTS. (Patient taking differently: Place 0.4 mg under the tongue every 5 (five) minutes as needed for chest pain.) 100 tablet 3   oxymetazoline (AFRIN) 0.05 % nasal spray Place 2 sprays into both nostrils at bedtime.     simvastatin (ZOCOR) 20 MG tablet Take 1 tablet (20 mg total) by mouth at bedtime. 30 tablet 3   Tiotropium Bromide-Olodaterol (STIOLTO RESPIMAT) 2.5-2.5 MCG/ACT AERS Inhale 2 puffs into the lungs daily. 4 g 3   umeclidinium-vilanterol (ANORO ELLIPTA) 62.5-25 MCG/ACT AEPB Inhale 1 puff into the lungs daily. 1 each 5   zinc gluconate 50 MG tablet Take 50 mg by mouth daily.     cephALEXin (KEFLEX) 500 MG capsule Take 1 capsule (500 mg total) by mouth 3 (three) times daily. (Patient not taking: Reported on 05/26/2022) 30 capsule 0   cetirizine (ZYRTEC) 10 MG tablet Take 1 tablet (10 mg total) by mouth at bedtime. 30 tablet 5   ranolazine (RANEXA) 500 MG 12 hr tablet Take 1 tablet (500 mg total) by mouth 2 (two) times daily. (Patient taking differently: Take 250 mg by mouth at bedtime. Cutting tablet in half) 60 tablet 3   tadalafil (CIALIS) 20 MG tablet Take 20 mg by mouth daily as needed for erectile dysfunction.     No facility-administered medications prior to visit.   Final Medications at End of Visit    Current Meds  Medication Sig   albuterol (VENTOLIN HFA) 108 (90 Base) MCG/ACT inhaler Inhale 2 puffs into the lungs every 6 (six) hours as needed for wheezing.   Ascorbic Acid (VITAMIN C) 1000 MG tablet Take 1,000 mg by mouth daily.   bisoprolol (ZEBETA) 5 MG tablet TAKE ONE-HALF TABLET BY  MOUTH DAILY (Patient taking differently: Take 2.5 mg by mouth daily.)   Blood  Glucose Monitoring Suppl (ONETOUCH VERIO FLEX SYSTEM) w/Device KIT USE TO CHECK BLOOD GLUCOSE UP TO 10 TIMES PER DAY   buPROPion (WELLBUTRIN SR) 150 MG 12 hr tablet TAKE 1 TABLET BY MOUTH  DAILY   Cholecalciferol (VITAMIN D3) 125 MCG (5000 UT) TABS Take 5,000 Units by mouth daily.   COD LIVER OIL PO Take 415 mg by mouth daily.  Continuous Blood Gluc Sensor (FREESTYLE LIBRE 14 DAY SENSOR) MISC CHANGE EVERY 14 DAYS TO MONITOR BLOOD GLUCOSE   Continuous Blood Gluc Sensor (FREESTYLE LIBRE 14 DAY SENSOR) MISC CHANGE EVERY 14 DAYS TO MONITOR BLOOD GLUOSE   gabapentin (NEURONTIN) 300 MG capsule Take 1 capsule (300 mg total) by mouth at bedtime.   HUMALOG 100 UNIT/ML injection Inject 15-20 Units into the skin 3 (three) times daily with meals. Sliding scale  Depending on carb intake   Hydrocortisone, Perianal, 1 % CREA SMARTSIG:Rectally 3 Times Daily PRN   insulin glargine (LANTUS) 100 UNIT/ML injection Inject 14-17 Units into the skin See admin instructions. Inject into the skin 17 units in the morning and 14 unit at bedtime   ipratropium (ATROVENT) 0.06 % nasal spray Place into both nostrils.   Lidocaine 4 % PTCH Place 1 patch onto the skin at bedtime.   LINZESS 145 MCG CAPS capsule Take 145 mcg by mouth every morning.   losartan (COZAAR) 25 MG tablet Take 25 mg by mouth daily.   magnesium gluconate (MAGONATE) 500 MG tablet Take 500 mg by mouth daily.   nitroGLYCERIN (NITROSTAT) 0.4 MG SL tablet DISSOLVE 1 TABLET UNDER THE TONGUE EVERY 5 MINUTES AS  NEEDED FOR CHEST PAIN. MAX  OF 3 TABLETS IN 15 MINUTES. CALL 911 IF PAIN PERSISTS. (Patient taking differently: Place 0.4 mg under the tongue every 5 (five) minutes as needed for chest pain.)   oxymetazoline (AFRIN) 0.05 % nasal spray Place 2 sprays into both nostrils at bedtime.   simvastatin (ZOCOR) 20 MG tablet Take 1 tablet (20 mg total) by mouth at bedtime.   Tiotropium Bromide-Olodaterol (STIOLTO RESPIMAT) 2.5-2.5 MCG/ACT AERS Inhale 2 puffs into the  lungs daily.   umeclidinium-vilanterol (ANORO ELLIPTA) 62.5-25 MCG/ACT AEPB Inhale 1 puff into the lungs daily.   zinc gluconate 50 MG tablet Take 50 mg by mouth daily.   Radiology    Chest CT 05/19/2022: IMPRESSION: 1. Lung-RADS 1, negative. Continue annual screening with low-dose chest CT without contrast in 12 months. 2. Left nephrolithiasis. 3. Median sternotomy. 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 4. Aortic atherosclerosis (ICD10-I70.0) and emphysema (ICD10-J43.9).  Cardiac Studies:   CABG 04/27/2014: LIMA to LAD, SVG to D1, SVG to OM1 and distal circumflex, SVG to PDA. Dr. Johnette Abraham  Coronary angiogram 10/30/2015: Normal LVEF, 55%. Mid LAD occluded after D1. LIMA to LAD, SVG to D1 patent. Circumflex occluded in the midsegment after OM1. SVG to OM2 patent, sequential branch to his to circumflex occluded. Distal circumflex filled by  Lexiscan Tetrofosmin Stress Test  07/20/2019: Nondiagnostic ECG stress. There is a small sized mild reversible mild defect in the lateral region.  Compared to the study done on 10/12/2015, large inferolateral ischemia not present. Defect is consistent with known occluded distal circumflex coronary artery. Stress LV EF: 62%.  Intermediate risk study.   ABI 10/11/2019: Right: Resting right ankle-brachial index indicates noncompressible right  lower extremity arteries. The right toe-brachial index is abnormal. RT  great toe pressure = 83 mmHg.  Left: Resting left ankle-brachial index is within normal range. The left  toe-brachial index is abnormal. LT Great toe pressure = 70 mmHg.   Echocardiogram 09/25/2020:    Normal LV systolic function with EF 62%. Left ventricle cavity is normal  in size. Normal global wall motion. Calculated EF 62%.  Structurally normal mitral valve. No evidence of mitral stenosis. Mild  (Grade I) mitral regurgitation.  Structurally normal tricuspid valve with trace regurgitation.  No  evidence  of tricuspid valve stenosis. No evidence of pulmonary hypertension.  No significant change from 06/08/2014.  Carotid artery duplex 11/20/2021: Duplex suggests stenosis in the right internal carotid artery (16-49%). Duplex suggests stenosis in the left internal carotid artery (16-49%). Antegrade right vertebral artery flow.  No significant change since 10/25/2020. Follow up in one year is appropriate if clinically indicated.  EKG  02/06/2022: Sinus rhythm at a rate of 61 bpm.  Normal axis.  Nonspecific T wave abnormality.  Compared EKG 11/25/2021, no significant change.  Assessment:     ICD-10-CM   1. Coronary artery disease involving native coronary artery of native heart without angina pectoris  I25.10     2. Dyspnea on exertion  R06.09     3. Smoking  F17.200     4. Asymptomatic bilateral carotid artery stenosis  I65.23       No orders of the defined types were placed in this encounter.  Medications Discontinued During This Encounter  Medication Reason   tadalafil (CIALIS) 20 MG tablet    ranolazine (RANEXA) 500 MG 12 hr tablet    cetirizine (ZYRTEC) 10 MG tablet     Recommendations:   [name]  is a 56 y.o. with history of Type I Diabetes, rheumatoid arthritis, hyperlipidemia, and CAD S/P coronary artery bypass grafting on 04/27/2014.  Patient with history of GI bleed 03/2021, at which time aspirin was held.  Coronary artery disease involving native coronary artery of native heart without angina pectoris He has not recurrence of chest pain.  He did stop taking Ranexa due to constipation. Reviewed chest CT which revealed aortic atherosclerosis. This will be repeated in one year by pulmonology.  Reviewed recent labs, lipids are under good control. He is tolerating simvastatin without myalgias.  We discussed the importance of exercise and diet.  Dyspnea on exertion Shortness of breath with exertion is chronic and stable. He did recently have upper  respiratory infections that was a possible side effect of RA medication. He has since stopped taking this medication and symptoms have improved. He is followed by Pulmonology and has follow-up scheduled with them today.   Smoking He continues smoking 1 pack of cigarettes per day. He is ready to quit and plans to start cutting back. He remains on Wellbutrin however, this has not helped with cigarette cravings.  We did discuss other medication options as well as nicotine patches and at this time he would like to try to quit on his own.  Asymptomatic bilateral carotid artery stenosis Reviewed previous carotid duplex that showed no significant change from previous study. Will continue with yearly surveillance.  Total time spent with patient was 45 minutes and greater than 50% of that time was spent in counseling and coordination care with the patient regarding complex decision making and discussion as state above.  Overall, he is doing well at this time from cardiac standpoint. Follow-up in 3 months or sooner if needed.    Ernst Spell, AGNP-C 05/26/2022, 10:07 AM Office: [phone]

## 2022-05-26 NOTE — Progress Notes (Signed)
Full PFT performed today. °

## 2022-05-26 NOTE — Patient Instructions (Signed)
Please schedule follow up scheduled with myself in 4 months.  If my schedule is not open yet, we will contact you with a reminder closer to that time. Please call [phone] if you haven't heard from [place] a month before.   Stop the stiolto. We will switch you to symbicort inhaler 2 puffs in the morning, 2 puffs at night, gargle after use.  I have refilled albuterol inhaler to take as needed for shortness of breath, wheezing.  CT scan looks ok - next due for a scan in October 2024.  Call us sooner if any issues or concerns with your breathing.

## 2022-05-26 NOTE — Progress Notes (Signed)
[name]    [id number]    06-Jan-1966  Primary Care Physician:Paterson, Ermalene Searing, MD Date of Appointment: 05/26/2022 Established Patient Visit  Chief complaint:   Chief Complaint  Patient presents with   Follow-up    PFT's today.  Stopped Simponi d/t respiratory infections. Resolved URI.    HPI: [name] is a 56 y.o. man with history of RA and tobacco use disorder with COPD.   Interval Updates: Switched from simponi to humira for RA due to adverse effects of URIs - treated with abx for this x 7 days.  Having some worsening pain in joints while this is transitioning.   Breathing is good - still occasionally wheezing at night.   Has been seen by lung cancer screening program. CT scan was low risk. Next due in a year.   Was on stiolto ellipta but he isn't sure if it helped. He stopped taking after one month when it ran out. Albuterol seemed to help the most.   I have reviewed the patient's family social and past medical history and updated as appropriate.   Past Medical History:  Diagnosis Date   Anginal pain ([place])    Anxiety    Arthritis    RA IN HANDS   Asthma    CAD (coronary artery disease) 04/27/2014   Chronic renal disease, stage 3, moderately decreased glomerular filtration rate between 30-59 mL/min/1.73 square meter ([place]) 05/03/2014   COPD (chronic obstructive pulmonary disease) (HCC)    Coronary artery disease    Diabetes ([place]) 05/03/2014   Diabetes mellitus without complication (HCC)    type 1   Hyperlipidemia    Left shoulder pain    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/03/2014   Shortness of breath    Sleep apnea    mild OSA, no CPAP   Unstable angina pectoris ([place]) 04/25/2014    Past Surgical History:  Procedure Laterality Date   CARDIAC CATHETERIZATION  04/25/2014   BY DR [place] N/A 10/30/2015   Procedure: Left Heart Cath and Cors/Grafts Angiography;  Surgeon: Adrian Prows, MD;  Location:  [facility];  Service: Cardiovascular;  Laterality: N/A;   CARPAL TUNNEL RELEASE     CATARACT EXTRACTION, BILATERAL     CORONARY ARTERY BYPASS GRAFT N/A 04/27/2014   Procedure: CORONARY ARTERY BYPASS GRAFTING on pump using left internal mammary artery to LAD coronary artery, right great saphenous vein graft to diagonal coronary artery with sequential to OM1 and circumflex coronary arteries. Right greater saphenous vein graft to posterior descending coronary artery. ;  Surgeon: Grace Isaac, MD;  Location: [place];  Service: Open Heart Surgery;  Laterality: N   elbow drained Left 05/09/2019   ENDOVEIN HARVEST OF GREATER SAPHENOUS VEIN Right 04/27/2014   Procedure: ENDOVEIN HARVEST OF GREATER SAPHENOUS VEIN;  Surgeon: Grace Isaac, MD;  Location: [place];  Service: Open Heart Surgery;  Laterality: Right;   EXCISION ORAL TUMOR N/A 03/01/2018   Procedure: EXCISION ORAL TUMOR;  Surgeon: Melida Quitter, MD;  Location: [place];  Service: ENT;  Laterality: N/A;   EYE SURGERY     LASER   EYE SURGERY Bilateral    astigmatism correction    HARDWARE REMOVAL Left 10/03/2020   Procedure: LEFT FOOT REMOVAL HARDWARE, PLACE VANC POWDER;  Surgeon: Newt Minion, MD;  Location: [place];  Service: Orthopedics;  Laterality: Left;   INTRAOPERATIVE TRANSESOPHAGEAL ECHOCARDIOGRAM N/A 04/27/2014  Procedure: INTRAOPERATIVE TRANSESOPHAGEAL ECHOCARDIOGRAM;  Surgeon: Grace Isaac, MD;  Location: [place];  Service: Open Heart Surgery;  Laterality: N/A;   LEFT HEART CATHETERIZATION WITH CORONARY ANGIOGRAM N/A 04/25/2014   Procedure: LEFT HEART CATHETERIZATION WITH CORONARY ANGIOGRAM;  Surgeon: Laverda Page, MD;  Location: [facility] C[facility] LAB;  Service: Cardiovascular;  Laterality: N/A;   MOUTH SURGERY  11/15/2017   tongue surgery    ORIF TOE FRACTURE Left 03/22/2020   Procedure: OPEN REDUCTION INTERNAL FIXATION (ORIF) Non Union 5th Metatarsal;  Surgeon: Mcarthur Rossetti, MD;  Location: [place];  Service: Orthopedics;  Laterality: Left;    Family History  Problem Relation Age of Onset   Stomach cancer Mother    Cancer Mother    Prostate cancer Father    Heart disease Father    Cancer - Prostate Father    Heart disease Brother    Asthma Daughter    Healthy Daughter    Asthma Daughter     Social History   Occupational History   Not on file  Tobacco Use   Smoking status: Some Days    Packs/day: 0.25    Years: 34.00    Total pack years: 8.50    Types: Cigarettes   Smokeless tobacco: Never  Vaping Use   Vaping Use: Never used  Substance and Sexual Activity   Alcohol use: Yes    Alcohol/week: 0.0 standard drinks of alcohol    Comment: RARE   Drug use: No   Sexual activity: Not on file     Physical Exam: Blood pressure (!) 120/58, pulse (!) 57, temperature 97.7 F (36.5 C), height 5\' 8"  (1.727 m), weight 195 lb (88.5 kg), SpO2 97 %.  Gen:      No acute distress ENT:  no nasal polyps, mucus membranes moist Lungs:    No increased respiratory effort, symmetric chest wall excursion, clear to auscultation bilaterally, no wheezes or crackles CV:         Regular rate and rhythm; no murmurs, rubs, or gallops.  No pedal edema   Data Reviewed: Imaging: I have personally reviewed the CT Chest 05/19/22 shows mild centrilobular emphysema, no suspicious nodules or masses.   PFTs:     Latest Ref Rng & Units 05/26/2022   10:03 AM 04/25/2014    2:13 PM  PFT Results  FVC-Pre L 2.72  P 2.83  P  FVC-Predicted Pre % 60  P 59  P  FVC-Post L 2.85  P 2.65  P  FVC-Predicted Post % 63  P 56  P  Pre FEV1/FVC % % 65  P 76  P  Post FEV1/FCV % % 66  P 79  P  FEV1-Pre L 1.77  P 2.14  P  FEV1-Predicted Pre % 51  P 58  P  FEV1-Post L 1.90  P 2.08  P  DLCO uncorrected ml/min/mmHg 19.35  P   DLCO UNC% % 73  P   DLCO corrected ml/min/mmHg 19.35  P   DLCO COR %Predicted % 73  P   DLVA Predicted % 97  P   TLC L 6.27  P   TLC % Predicted % 95  P   RV %  Predicted % 136  P     P Preliminary result   I have personally reviewed the patient's PFTs and moderately severe airflow limitation.   Labs:  Immunization status:  There is no immunization history on file for this patient.  External Records Personally Reviewed:  Pulmonary  Assessment:  Moderately severe COPD FEV1 55% of predicted Need for lung cancer screening History of tobacco use disorder  Plan/Recommendations: Stop the stiolto since no benefit. We will switch you to symbicort inhaler 2 puffs in the morning, 2 puffs at night, gargle after use.  I have refilled albuterol inhaler to take as needed for shortness of breath, wheezing.  CT scan looks ok - next due for a scan in October 2024.  Call us sooner if any issues or concerns with your breathing.    Return to Care: Return in about 4 months (around 09/26/2022).   Lenice Llamas, MD Pulmonary and [place]

## 2022-07-10 ENCOUNTER — Encounter: Payer: Self-pay | Admitting: Podiatry

## 2022-07-10 ENCOUNTER — Ambulatory Visit: Payer: 59 | Admitting: Podiatry

## 2022-07-10 DIAGNOSIS — I739 Peripheral vascular disease, unspecified: Secondary | ICD-10-CM | POA: Diagnosis not present

## 2022-07-10 DIAGNOSIS — B351 Tinea unguium: Secondary | ICD-10-CM | POA: Diagnosis not present

## 2022-07-10 DIAGNOSIS — E1042 Type 1 diabetes mellitus with diabetic polyneuropathy: Secondary | ICD-10-CM | POA: Diagnosis not present

## 2022-07-10 DIAGNOSIS — M79676 Pain in unspecified toe(s): Secondary | ICD-10-CM | POA: Diagnosis not present

## 2022-07-10 DIAGNOSIS — L603 Nail dystrophy: Secondary | ICD-10-CM

## 2022-07-13 NOTE — Progress Notes (Signed)
He presents today chief complaint of painful elongated toenails.  Objective: Pulses remain palpable bilateral.  No open lesions or wounds.  Toenails are long thick yellow dystrophic clinically mycotic.  Assessment: Pain in limb secondary to onychomycosis.  Plan: Took samples of the toenails today of the left foot.  Debrided nails 1 through 5 bilaterally otherwise follow-up with him in 1 month for findings.

## 2022-08-24 ENCOUNTER — Other Ambulatory Visit: Payer: Self-pay | Admitting: Cardiology

## 2022-08-28 ENCOUNTER — Ambulatory Visit: Payer: 59

## 2022-08-28 ENCOUNTER — Encounter: Payer: Self-pay | Admitting: Cardiology

## 2022-08-28 ENCOUNTER — Ambulatory Visit: Payer: 59 | Admitting: Cardiology

## 2022-08-28 VITALS — BP 133/59 | HR 67 | Ht 68.0 in | Wt 200.4 lb

## 2022-08-28 DIAGNOSIS — I10 Essential (primary) hypertension: Secondary | ICD-10-CM

## 2022-08-28 DIAGNOSIS — I6523 Occlusion and stenosis of bilateral carotid arteries: Secondary | ICD-10-CM

## 2022-08-28 DIAGNOSIS — E7800 Pure hypercholesterolemia, unspecified: Secondary | ICD-10-CM

## 2022-08-28 DIAGNOSIS — I25118 Atherosclerotic heart disease of native coronary artery with other forms of angina pectoris: Secondary | ICD-10-CM

## 2022-08-28 DIAGNOSIS — F17200 Nicotine dependence, unspecified, uncomplicated: Secondary | ICD-10-CM

## 2022-08-28 NOTE — Progress Notes (Signed)
Primary Physician:  Garlan Fillers, MD  Patient ID: [name], [name]    DOB: 22-Jun-1966, 57 y.o.   MRN: [id number]  Subjective:   Chief Complaint  Patient presents with   Coronary artery disease involving native coronary artery of    HPI: [name]  is a 57 y.o. [name]  with history of Type I Diabetes, rheumatoid arthritis, hyperlipidemia, and CAD S/P coronary artery bypass grafting on 04/27/2014, moderate COPD secondary to smoking, h/o GI bleed 03/2021, at which time aspirin was held.  Patient presents today for 6 month follow-up.  He had 1 episode of angina 2 days ago for which he took nitroglycerin with immediate relief.  He has not had any recurrence.  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  Denies symptoms of claudication.  He continues smoking cigarettes approximately 1 pack/day.  He does want to stop smoking until he retires and he plans to retire in about 8 to 9 months.  He  is working on cutting back. He is not currently exercising and does report that diet could be better.   Past Medical History:  Diagnosis Date   Anginal pain (HCC)    Anxiety    Arthritis    RA IN HANDS   Asthma    CAD (coronary artery disease) 04/27/2014   Chronic renal disease, stage 3, moderately decreased glomerular filtration rate between 30-59 mL/min/1.73 square meter (HCC) 05/03/2014   COPD (chronic obstructive pulmonary disease) (HCC)    Coronary artery disease    Diabetes (HCC) 05/03/2014   Diabetes mellitus without complication (HCC)    type 1   Hyperlipidemia    Left shoulder pain    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/03/2014   Shortness of breath    Sleep apnea    mild OSA, no CPAP   Unstable angina pectoris (HCC) 04/25/2014    Past Surgical History:  Procedure Laterality Date   CARDIAC CATHETERIZATION  04/25/2014   BY DR Jacinto Halim   CARDIAC CATHETERIZATION N/A 10/30/2015   Procedure: Left Heart Cath and Cors/Grafts Angiography;  Surgeon:  Yates Decamp, MD;  Location: [facility] INVASIVE [facility];  Service: Cardiovascular;  Laterality: N/A;   CARPAL TUNNEL RELEASE     CATARACT EXTRACTION, BILATERAL     CORONARY ARTERY BYPASS GRAFT N/A 04/27/2014   Procedure: CORONARY ARTERY BYPASS GRAFTING on pump using left internal mammary artery to LAD coronary artery, right great saphenous vein graft to diagonal coronary artery with sequential to OM1 and circumflex coronary arteries. Right greater saphenous vein graft to posterior descending coronary artery. ;  Surgeon: Delight Ovens, MD;  Location: [facility];  Service: Open Heart Surgery;  Laterality: N   elbow drained Left 05/09/2019   ENDOVEIN HARVEST OF GREATER SAPHENOUS VEIN Right 04/27/2014   Procedure: ENDOVEIN HARVEST OF GREATER SAPHENOUS VEIN;  Surgeon: Delight Ovens, MD;  Location: [facility];  Service: Open Heart Surgery;  Laterality: Right;   EXCISION ORAL TUMOR N/A 03/01/2018   Procedure: EXCISION ORAL TUMOR;  Surgeon: Christia Reading, MD;  Location: [facility];  Service: ENT;  Laterality: N/A;   EYE SURGERY     LASER   EYE SURGERY Bilateral    astigmatism correction    HARDWARE REMOVAL Left 10/03/2020   Procedure: LEFT FOOT REMOVAL HARDWARE, PLACE VANC POWDER;  Surgeon: Nadara Mustard, MD;  Location: [facility];  Service: Orthopedics;  Laterality: Left;   INTRAOPERATIVE TRANSESOPHAGEAL ECHOCARDIOGRAM N/A 04/27/2014  Procedure: INTRAOPERATIVE TRANSESOPHAGEAL ECHOCARDIOGRAM;  Surgeon: Grace Isaac, MD;  Location: [place];  Service: Open Heart Surgery;  Laterality: N/A;   LEFT HEART CATHETERIZATION WITH CORONARY ANGIOGRAM N/A 04/25/2014   Procedure: LEFT HEART CATHETERIZATION WITH CORONARY ANGIOGRAM;  Surgeon: Laverda Page, MD;  Location: [facility] CATH LAB;  Service: Cardiovascular;  Laterality: N/A;   MOUTH SURGERY  11/15/2017   tongue surgery    ORIF TOE FRACTURE Left 03/22/2020   Procedure: OPEN REDUCTION INTERNAL FIXATION (ORIF) Non Union 5th Metatarsal;  Surgeon: Mcarthur Rossetti, MD;  Location: [place];  Service: Orthopedics;  Laterality: Left;   Family History  Problem Relation Age of Onset   Stomach cancer Mother    Cancer Mother    Prostate cancer Father    Heart disease Father    Cancer - Prostate Father    Heart disease Brother    Asthma Daughter    Healthy Daughter    Asthma Daughter     Social History   Tobacco Use   Smoking status: Some Days    Packs/day: 0.25    Years: 34.00    Total pack years: 8.50    Types: Cigarettes   Smokeless tobacco: Never  Substance Use Topics   Alcohol use: Yes    Alcohol/week: 0.0 standard drinks of alcohol    Comment: RARE    Marital status: Divorced   Review of Systems  Cardiovascular:  Positive for chest pain and dyspnea on exertion. Negative for leg swelling.   Objective:  Blood pressure (!) 133/59, pulse 67, height 5\' 8"  (1.727 m), weight 200 lb 6.4 oz (90.9 kg), SpO2 96 %. Body mass index is 30.47 kg/m.      08/28/2022    9:51 AM 05/26/2022    1:52 PM 05/26/2022   11:27 AM  Vitals with BMI  Height 5\' 8"  5\' 8"  5\' 8"   Weight 200 lbs 6 oz 195 lbs 195 lbs  BMI 30.48 81.19 14.78  Systolic 295 621 308  Diastolic 59 58 58  Pulse 67 57 57     Physical Exam Vitals reviewed.  Constitutional:      Appearance: He is well-developed.  Neck:     Vascular: Carotid bruit (bilateral) present. No JVD.  Cardiovascular:     Rate and Rhythm: Normal rate and regular rhythm.     Pulses:          Popliteal pulses are 2+ on the right side and 2+ on the left side.       Dorsalis pedis pulses are 1+ on the right side and 0 on the left side.       Posterior tibial pulses are 0 on the right side and 0 on the left side.     Heart sounds: Normal heart sounds, S1 normal and S2 normal. No murmur heard.    No gallop.  Pulmonary:     Effort: Pulmonary effort is normal. No accessory muscle usage.     Breath sounds: Normal breath sounds.  Abdominal:     General: Bowel sounds are normal.      Palpations: Abdomen is soft.  Musculoskeletal:     Right lower leg: No edema.     Left lower leg: No edema.    Laboratory examination:   Lab Results  Component Value Date   NA 135 10/03/2020   K 4.4 10/03/2020   CO2 23 10/03/2020   GLUCOSE 169 (H) 10/03/2020   BUN 30 (H) 10/03/2020   CREATININE 1.52 (  H) 10/03/2020   CALCIUM 8.6 (L) 10/03/2020   GFRNONAA 53 (L) 10/03/2020       Latest Ref Rng & Units 10/03/2020    6:45 AM 03/19/2020    2:57 PM 12/19/2019    9:16 AM  CMP  Glucose 70 - 99 mg/dL 169  228  89   BUN 6 - 20 mg/dL 30  10  14    Creatinine 0.61 - 1.24 mg/dL 1.52  1.38  1.54   Sodium 135 - 145 mmol/L 135  139  138   Potassium 3.5 - 5.1 mmol/L 4.4  4.9  4.6   Chloride 98 - 111 mmol/L 104  105  105   CO2 22 - 32 mmol/L 23  26  25    Calcium 8.9 - 10.3 mg/dL 8.6  8.9  9.0   Total Protein 6.1 - 8.1 g/dL 6.1 - 8.1 g/dL   6.5    6.5   Total Bilirubin 0.2 - 1.2 mg/dL   0.3   AST 10 - 35 U/L   37   ALT 9 - 46 U/L   40       Latest Ref Rng & Units 04/01/2021    3:37 PM 10/03/2020    6:45 AM 12/19/2019    9:16 AM  CBC  WBC 4.0 - 10.5 K/uL  12.9  10.5   Hemoglobin 13.0 - 17.7 g/dL 11.3  11.7  13.1   Hematocrit 37.5 - 51.0 % 33.9  36.6  39.4   Platelets 150 - 400 K/uL  275  251    Lipid Panel     Component Value Date/Time   CHOL 81 (L) 12/09/2021 1629   TRIG 77 12/09/2021 1629   HDL 28 (L) 12/09/2021 1629   CHOLHDL 1.9 02/10/2019 0907   LDLCALC 37 12/09/2021 1629   Lab Results  Component Value Date   TSH 2.00 06/01/2014    External labs:  04/14/2022: Hemoglobin A1c 6.5% Hemoglobin 12.3, hematocrit 39.0, platelets 264  01/17/2022: Sodium 134, potassium 5.5, glucose 154, BUN 19, creatinine 1.6 Sed Rate 17, CRP <5.0  Allergies   Allergies  Allergen Reactions   Metoprolol Diarrhea   Niacin And Related     Other reaction(s): night sweats   Tramadol     Other reaction(s): SOB     Final Medications at End of Visit     Current Outpatient Medications:     albuterol (VENTOLIN HFA) 108 (90 Base) MCG/ACT inhaler, Inhale 2 puffs into the lungs every 6 (six) hours as needed for wheezing., Disp: 1 each, Rfl: 5   Ascorbic Acid (VITAMIN C) 1000 MG tablet, Take 1,000 mg by mouth daily., Disp: , Rfl:    bisoprolol (ZEBETA) 5 MG tablet, TAKE ONE-HALF TABLET BY  MOUTH DAILY (Patient taking differently: Take 2.5 mg by mouth daily.), Disp: 45 tablet, Rfl: 3   Blood Glucose Monitoring Suppl (ONETOUCH VERIO FLEX SYSTEM) w/Device KIT, USE TO CHECK BLOOD GLUCOSE UP TO 10 TIMES PER DAY, Disp: , Rfl:    budesonide-formoterol (SYMBICORT) 80-4.5 MCG/ACT inhaler, Inhale 2 puffs into the lungs in the morning and at bedtime., Disp: 1 each, Rfl: 5   buPROPion (WELLBUTRIN SR) 150 MG 12 hr tablet, TAKE 1 TABLET BY MOUTH  DAILY, Disp: 90 tablet, Rfl: 3   cetirizine (ZYRTEC) 10 MG tablet, Take 10 mg by mouth daily., Disp: , Rfl:    Cholecalciferol (VITAMIN D3) 125 MCG (5000 UT) TABS, Take 5,000 Units by mouth daily., Disp: , Rfl:    COD LIVER  OIL PO, Take 415 mg by mouth daily., Disp: , Rfl:    Continuous Blood Gluc Sensor (FREESTYLE LIBRE 14 DAY SENSOR) MISC, CHANGE EVERY 14 DAYS TO MONITOR BLOOD GLUCOSE, Disp: , Rfl:    gabapentin (NEURONTIN) 300 MG capsule, Take 1 capsule (300 mg total) by mouth at bedtime., Disp: 30 capsule, Rfl: 2   HUMALOG 100 UNIT/ML injection, Inject 15-20 Units into the skin 3 (three) times daily with meals. Sliding scale  Depending on carb intake, Disp: , Rfl:    HUMIRA PEN 40 MG/0.4ML PNKT, SMARTSIG:40 Milligram(s) SUB-Q Every 2 Weeks, Disp: , Rfl:    Hydrocortisone, Perianal, 1 % CREA, SMARTSIG:Rectally 3 Times Daily PRN, Disp: , Rfl:    insulin glargine (LANTUS) 100 UNIT/ML injection, Inject 14-17 Units into the skin See admin instructions. Inject into the skin 17 units in the morning and 14 unit at bedtime, Disp: , Rfl:    ipratropium (ATROVENT) 0.06 % nasal spray, Place into both nostrils., Disp: , Rfl:    Lidocaine 4 % PTCH, Place 1 patch onto the  skin at bedtime., Disp: , Rfl:    LINZESS 145 MCG CAPS capsule, Take 145 mcg by mouth every morning., Disp: , Rfl:    losartan (COZAAR) 25 MG tablet, Take 25 mg by mouth daily., Disp: , Rfl:    magnesium gluconate (MAGONATE) 500 MG tablet, Take 500 mg by mouth daily., Disp: , Rfl:    nitroGLYCERIN (NITROSTAT) 0.4 MG SL tablet, DISSOLVE 1 TABLET UNDER THE TONGUE EVERY 5 MINUTES AS  NEEDED FOR CHEST PAIN. MAX  OF 3 TABLETS IN 15 MINUTES. CALL 911 IF PAIN PERSISTS. (Patient taking differently: Place 0.4 mg under the tongue every 5 (five) minutes as needed for chest pain.), Disp: 100 tablet, Rfl: 3   oxymetazoline (AFRIN) 0.05 % nasal spray, Place 2 sprays into both nostrils at bedtime., Disp: , Rfl:    ranolazine (RANEXA) 500 MG 12 hr tablet, Take 250 mg by mouth 2 (two) times daily., Disp: , Rfl:    simvastatin (ZOCOR) 20 MG tablet, TAKE 1 TABLET BY MOUTH AT BEDTIME, Disp: 30 tablet, Rfl: 0   tadalafil (CIALIS) 20 MG tablet, Take 20 mg by mouth daily as needed., Disp: , Rfl:    zinc gluconate 50 MG tablet, Take 50 mg by mouth daily., Disp: , Rfl:    Radiology    Chest CT 05/19/2022: IMPRESSION: 1. Lung-RADS 1, negative. Continue annual screening with low-dose chest CT without contrast in 12 months. 2. Left nephrolithiasis. 3. Median sternotomy. 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 4. Aortic atherosclerosis (ICD10-I70.0) and emphysema (ICD10-J43.9).  Cardiac Studies:   CABG 04/27/2014: LIMA to LAD, SVG to D1, SVG to OM1 and distal circumflex, SVG to PDA. Dr. Bea Laura  Coronary angiogram 10/30/2015:  Normal LVEF, 55%. Mid LAD occluded after D1. LIMA to LAD, SVG to D1 patent.  Circumflex occluded in the midsegment after OM1. SVG to OM2 patent, sequential branch to his to circumflex occluded. Distal circumflex filled by Ipsilateral collaterals to the distal circumflex evident.  SVG to RCA widely patent. Native RCA high-grade 80-90% mid stenosis and  distal calcific 80% stenosis. The vein graft retrogradely fills the PL branch.    SVG to RCA patent has not been included in this hand diagram  Lexiscan Tetrofosmin Stress Test  07/20/2019: Nondiagnostic ECG stress. There is a small sized mild reversible mild defect in the lateral region.  Compared to the study done on 10/12/2015, large inferolateral ischemia  not present. Defect is consistent with known occluded distal circumflex coronary artery. Stress LV EF: 62%.  Intermediate risk study.   ABI 10/11/2019: Right: Resting right ankle-brachial index indicates noncompressible right  lower extremity arteries. The right toe-brachial index is abnormal. RT  great toe pressure = 83 mmHg.  Left: Resting left ankle-brachial index is within normal range. The left  toe-brachial index is abnormal. LT Great toe pressure = 70 mmHg.   Echocardiogram 09/25/2020:    Normal LV systolic function with EF 62%. Left ventricle cavity is normal  in size. Normal global wall motion. Calculated EF 62%.  Structurally normal mitral valve. No evidence of mitral stenosis. Mild  (Grade I) mitral regurgitation.  Structurally normal tricuspid valve with trace regurgitation. No evidence  of tricuspid valve stenosis. No evidence of pulmonary hypertension.  No significant change from 06/08/2014.  Carotid artery duplex 11/20/2021: Duplex suggests stenosis in the right internal carotid artery (16-49%). Duplex suggests stenosis in the left internal carotid artery (16-49%). Antegrade right vertebral artery flow.  No significant change since 10/25/2020. Follow up in one year is appropriate if clinically indicated.  EKG   EKG 08/28/2022: Normal sinus rhythm at rate of 65 bpm, LAE, normal axis.  Nonspecific T abnormality in lateral leads. Compared to 02/06/2022, no significant change.  Assessment:     ICD-10-CM   1. Coronary artery disease of native artery of native heart with stable angina pectoris (HCC)  I25.118 EKG  12-Lead    2. Asymptomatic bilateral carotid artery stenosis  I65.23 PCV CAROTID DUPLEX (BILATERAL)    3. Primary hypertension  I10     4. Pure hypercholesterolemia  E78.00     5. Smoking  F17.200      No orders of the defined types were placed in this encounter.  Medications Discontinued During This Encounter  Medication Reason   cephALEXin (KEFLEX) 500 MG capsule Completed Course   Continuous Blood Gluc Sensor (FREESTYLE LIBRE 14 DAY SENSOR) MISC Duplicate    Recommendations:   [name]  is a 57 y.o. with history of Type I Diabetes with stage 3a CKD, rheumatoid arthritis, hyperlipidemia, and CAD S/P coronary artery bypass grafting on 04/27/2014.  Patient with history of GI bleed 03/2021, at which time aspirin was held.  1. Coronary artery disease involving native coronary artery of native heart without angina pectoris Patient has had 1 episode of angina pectoris about 2 days ago for which he took nitroglycerin.  States that since being on Ranexa although at higher dose it had given him constipation, he feels very well with this.  I reviewed the results of the angiogram again with the patient, advised him that the distal circumflex is unprotected as the skip graft from OM1 to OM 3 has occluded and he has got a CTO in the mid segment of the CX.  Medical therapy only for now unless symptoms of angina become intractable.  Smoking cessation again discussed.  2. Asymptomatic bilateral carotid artery stenosis He needs surveillance carotid artery duplex.  Mild disease bilaterally.  3. Primary hypertension Blood pressure is well-controlled.  I did not make any changes to his medications.  4. Pure hypercholesterolemia I reviewed his external labs, lipids under excellent control.  His diabetes is also under excellent control.  5. Smoking His only modifiable risk factor that has not been addressed so far has been smoking.  Again I reviewed with him that he is only 57 years of age with  extreme high risk, connective tissue disease, COPD, CAD  and CABG.  He also has PAD by vascular exam and ABI.  I will see him back in 6 months for follow-up, sooner if needed.    Yates Decamp, MD, [facility] 08/28/2022, 10:47 AM Office: [phone] Fax: [phone] Pager: [phone]

## 2022-09-05 ENCOUNTER — Ambulatory Visit: Payer: 59

## 2022-09-05 DIAGNOSIS — I6523 Occlusion and stenosis of bilateral carotid arteries: Secondary | ICD-10-CM

## 2022-09-08 ENCOUNTER — Other Ambulatory Visit: Payer: Self-pay | Admitting: Internal Medicine

## 2022-09-11 ENCOUNTER — Ambulatory Visit (INDEPENDENT_AMBULATORY_CARE_PROVIDER_SITE_OTHER): Payer: 59

## 2022-09-11 ENCOUNTER — Ambulatory Visit: Payer: 59 | Admitting: Orthopedic Surgery

## 2022-09-11 ENCOUNTER — Encounter: Payer: Self-pay | Admitting: Orthopedic Surgery

## 2022-09-11 DIAGNOSIS — M79672 Pain in left foot: Secondary | ICD-10-CM

## 2022-09-11 DIAGNOSIS — T847XXD Infection and inflammatory reaction due to other internal orthopedic prosthetic devices, implants and grafts, subsequent encounter: Secondary | ICD-10-CM

## 2022-09-11 NOTE — Progress Notes (Signed)
Office Visit Note   Patient: [name]           Date of Birth: 1965-12-01           MRN: [id number] Visit Date: 09/11/2022              Requested by: Donnajean Lopes, MD [address] [place],  [place] [address] PCP: Donnajean Lopes, MD  Chief Complaint  Patient presents with   Left Foot - Follow-up    Left foot ORIF non union 5th MT fx 03/22/20 HSW 10/23/20      HPI: Patient is a 57 year old gentleman who presents status post removal of hardware for infection internal fixation base of the fifth metatarsal nonunion.  Initially underwent surgery on 09 March 2020 with hardware removal in March 2022.  Patient states he has pain over the area of swelling directly over the area of surgery.  He states with prolonged ambulation he has a sharp shooting pain.  Patient states he is able to ambulate at home barefoot.  Patient has had temporary relief with arch supports and supportive sneakers.  Patient has history of diabetes and rheumatoid arthritis.  Assessment & Plan: Visit Diagnoses:  1. Pain in left foot   2. Hardware complicating wound infection, subsequent encounter     Plan: Will request a CT scan of the left foot to further evaluate the union of the fifth metatarsal fracture.  Patient is not symptomatic over the area of the os perinei.  Follow-Up Instructions: Return if symptoms worsen or fail to improve.   Ortho Exam  Patient is alert, oriented, no adenopathy, well-dressed, normal affect, normal respiratory effort. Examination patient has a palpable dorsalis pedis pulse there is no cellulitis there is some swelling directly over the previous fracture site.  This is an area that is tender to palpation.  The cuboid and the area of the os perinei are nontender to palpation.  Imaging: XR Foot Complete Left  Result Date: 09/11/2022 Three-view radiographs of the left foot shows stable healing of the base of the fifth metatarsal.  There is peripheral vascular disease  with calcification of the arteries out to the metatarsals.  Patient has a os perinei.  No lytic bony changes at the base of the fifth metatarsal.  No images are attached to the encounter.  Labs: Lab Results  Component Value Date   HGBA1C 7.4 (H) 04/25/2014   HGBA1C 7.4 (H) 04/25/2014   ESRSEDRATE 9 09/26/2019   ESRSEDRATE 12 09/09/2017   ESRSEDRATE 58 (H) 06/01/2014   REPTSTATUS 10/08/2020 FINAL 10/03/2020   GRAMSTAIN  10/03/2020    FEW WBC PRESENT, PREDOMINANTLY MONONUCLEAR NO ORGANISMS SEEN    CULT  10/03/2020    NO ANAEROBES ISOLATED Performed at [facility], [place] [address], [place], [place] [address]      Lab Results  Component Value Date   ALBUMIN 3.7 03/15/2018   ALBUMIN 3.7 09/09/2017   ALBUMIN 4.2 03/30/2017    Lab Results  Component Value Date   MG 2.3 04/28/2014   MG 2.4 04/28/2014   MG 2.7 (H) 04/27/2014   Lab Results  Component Value Date   VD25OH 57 06/22/2019    No results found for: "PREALBUMIN"    Latest Ref Rng & Units 04/01/2021    3:37 PM 10/03/2020    6:45 AM 12/19/2019    9:16 AM  CBC EXTENDED  WBC 4.0 - 10.5 K/uL  12.9  10.5   RBC 4.22 - 5.81 MIL/uL  3.79  4.27   Hemoglobin 13.0 - 17.7 g/dL 11.3  11.7  13.1   HCT 37.5 - 51.0 % 33.9  36.6  39.4   Platelets 150 - 400 K/uL  275  251   NEUT# 1,500 - 7,800 cells/uL   5,660   Lymph# 850 - 3,900 cells/uL   3,507      There is no height or weight on file to calculate BMI.  Orders:  Orders Placed This Encounter  Procedures   XR Foot Complete Left   No orders of the defined types were placed in this encounter.    Procedures: No procedures performed  Clinical Data: No additional findings.  ROS:  All other systems negative, except as noted in the HPI. Review of Systems  Objective: Vital Signs: There were no vitals taken for this visit.  Specialty Comments:  No specialty comments available.  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 03/22/2020   Osteopenia of multiple sites 02/28/2020   PVD (peripheral vascular disease) ([place]) 10/11/2019   Chronic venous insufficiency 10/05/2018   Diabetic cataract ([place]) 05/21/2018   Controlled type 1 diabetes mellitus with diabetic peripheral angiopathy without gangrene ([place]) 08/14/2017   Dyslipidemia 03/26/2017   High risk medication use 09/23/2016   History of hypertension 09/23/2016   History of chronic kidney disease 09/23/2016   History of coronary artery disease 09/23/2016   Tobacco abuse 09/23/2016   Primary osteoarthritis of both knees 09/23/2016   History of diabetes mellitus 09/23/2016   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 ([place]) 11/20/2015   Chest pain with high risk for cardiac etiology 10/29/2015   Asymptomatic bilateral carotid artery stenosis 06/08/2014   Pleural effusion, bilateral 06/03/2014   Dyspnea 06/01/2014   Diabetes ([place]) 05/03/2014   Rheumatoid arthritis involving multiple joints ([place]) 05/03/2014   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   Chronic renal disease, stage 3, moderately decreased glomerular filtration rate between 30-59 mL/min/1.73 square meter ([place]) 05/03/2014   CAD (coronary artery disease) 04/27/2014   Acne 12/19/2013   On isotretinoin therapy 12/19/2013   Nuclear sclerotic cataract, bilateral 12/19/2011   Vitreous hemorrhage ([place]) 06/16/2011   Past Medical History:  Diagnosis Date   Anginal pain (HCC)    Anxiety    Arthritis    RA IN HANDS   Asthma    CAD (coronary artery disease) 04/27/2014   Chronic renal disease, stage 3, moderately decreased glomerular filtration rate between 30-59 mL/min/1.73 square meter ([place]) 05/03/2014   COPD (chronic obstructive pulmonary disease) ([place])    Coronary artery disease    Diabetes  ([place]) 05/03/2014   Diabetes mellitus without complication (HCC)    type 1   Hyperlipidemia    Left shoulder pain    Rheumatoid arthritis involving multiple joints ([place]) 05/03/2014   Shortness of breath    Sleep apnea    mild OSA, no CPAP   Unstable angina pectoris ([place]) 04/25/2014    Family History  Problem Relation Age of Onset   Stomach cancer Mother    Cancer Mother    Prostate cancer Father    Heart disease Father    Cancer - Prostate Father    Heart disease Brother    Asthma Daughter    Healthy Daughter    Asthma Daughter  Past Surgical History:  Procedure Laterality Date   CARDIAC CATHETERIZATION  04/25/2014   BY DR Einar Gip   CARDIAC CATHETERIZATION N/A 10/30/2015   Procedure: Left Heart Cath and Cors/Grafts Angiography;  Surgeon: Adrian Prows, MD;  Location: [facility];  Service: Cardiovascular;  Laterality: N/A;   CARPAL TUNNEL RELEASE     CATARACT EXTRACTION, BILATERAL     CORONARY ARTERY BYPASS GRAFT N/A 04/27/2014   Procedure: CORONARY ARTERY BYPASS GRAFTING on pump using left internal mammary artery to LAD coronary artery, right great saphenous vein graft to diagonal coronary artery with sequential to OM1 and circumflex coronary arteries. Right greater saphenous vein graft to posterior descending coronary artery. ;  Surgeon: Grace Isaac, MD;  Location: [facility];  Service: Open Heart Surgery;  Laterality: N   elbow drained Left 05/09/2019   ENDOVEIN HARVEST OF GREATER SAPHENOUS VEIN Right 04/27/2014   Procedure: ENDOVEIN HARVEST OF GREATER SAPHENOUS VEIN;  Surgeon: Grace Isaac, MD;  Location: [facility];  Service: Open Heart Surgery;  Laterality: Right;   EXCISION ORAL TUMOR N/A 03/01/2018   Procedure: EXCISION ORAL TUMOR;  Surgeon: Melida Quitter, MD;  Location: [facility];  Service: ENT;  Laterality: N/A;   EYE SURGERY     LASER   EYE SURGERY Bilateral    astigmatism correction    HARDWARE REMOVAL Left 10/03/2020   Procedure: LEFT FOOT REMOVAL  HARDWARE, PLACE VANC POWDER;  Surgeon: Newt Minion, MD;  Location: [facility];  Service: Orthopedics;  Laterality: Left;   INTRAOPERATIVE TRANSESOPHAGEAL ECHOCARDIOGRAM N/A 04/27/2014   Procedure: INTRAOPERATIVE TRANSESOPHAGEAL ECHOCARDIOGRAM;  Surgeon: Grace Isaac, MD;  Location: [facility];  Service: Open Heart Surgery;  Laterality: N/A;   LEFT HEART CATHETERIZATION WITH CORONARY ANGIOGRAM N/A 04/25/2014   Procedure: LEFT HEART CATHETERIZATION WITH CORONARY ANGIOGRAM;  Surgeon: Laverda Page, MD;  Location: [facility] LAB;  Service: Cardiovascular;  Laterality: N/A;   MOUTH SURGERY  11/15/2017   tongue surgery    ORIF TOE FRACTURE Left 03/22/2020   Procedure: OPEN REDUCTION INTERNAL FIXATION (ORIF) Non Union 5th Metatarsal;  Surgeon: Mcarthur Rossetti, MD;  Location: [facility];  Service: Orthopedics;  Laterality: Left;   Social History   Occupational History   Not on file  Tobacco Use   Smoking status: Some Days    Packs/day: 0.25    Years: 34.00    Total pack years: 8.50    Types: Cigarettes   Smokeless tobacco: Never  Vaping Use   Vaping Use: Never used  Substance and Sexual Activity   Alcohol use: Yes    Alcohol/week: 0.0 standard drinks of alcohol    Comment: RARE   Drug use: No   Sexual activity: Not on file

## 2022-09-28 ENCOUNTER — Other Ambulatory Visit: Payer: Self-pay | Admitting: Cardiology

## 2022-10-03 ENCOUNTER — Ambulatory Visit
Admission: RE | Admit: 2022-10-03 | Discharge: 2022-10-03 | Disposition: A | Discharge status: Home / Self Care | Payer: 59 | Source: Ambulatory Visit | Attending: Orthopedic Surgery | Admitting: Orthopedic Surgery

## 2022-10-03 DIAGNOSIS — T847XXD Infection and inflammatory reaction due to other internal orthopedic prosthetic devices, implants and grafts, subsequent encounter: Secondary | ICD-10-CM

## 2022-10-03 DIAGNOSIS — M79672 Pain in left foot: Secondary | ICD-10-CM

## 2022-10-05 ENCOUNTER — Other Ambulatory Visit: Payer: Self-pay | Admitting: Cardiology

## 2022-10-06 ENCOUNTER — Ambulatory Visit: Payer: 59 | Admitting: Orthopedic Surgery

## 2022-10-06 DIAGNOSIS — S99192G Other physeal fracture of left metatarsal, subsequent encounter for fracture with delayed healing: Secondary | ICD-10-CM | POA: Diagnosis not present

## 2022-10-07 ENCOUNTER — Encounter: Payer: Self-pay | Admitting: Orthopedic Surgery

## 2022-10-07 NOTE — Progress Notes (Signed)
Office Visit Note   Patient: [name]           Date of Birth: [birth year]           MRN: [id number] Visit Date: 10/06/2022              Requested by: Donnajean Lopes, MD [address] [place],  [place] [address] PCP: Donnajean Lopes, MD  Chief Complaint  Patient presents with   Left Foot - Follow-up      HPI: Patient is a 57 year old gentleman who is status post revision internal fixation and removal of hardware for a nonunion base of the fifth metatarsal left foot.  Patient recently has been symptomatic a CT scan was obtained.  Patient is currently wearing a compression sock and a elastic ankle support and he states he is essentially pain-free at this time.  Patient has recently started Pletal by Dr. Sharlett Iles to help improve his microcirculation.  Assessment & Plan: Visit Diagnoses:  1. Closed fracture of base of fifth metatarsal bone of left foot at metaphyseal-diaphyseal junction with delayed healing, subsequent encounter     Plan: Patient would like to continue with conservative treatment with his elastic support and compression socks.  He will continue with the Pletal.  Follow-Up Instructions: Return if symptoms worsen or fail to improve.   Ortho Exam  Patient is alert, oriented, no adenopathy, well-dressed, normal affect, normal respiratory effort. Examination there is no redness or cellulitis patient is asymptomatic walking at this time.  Review of the CT scan shows a fibrous union base of the fifth metatarsal approximately 75% with 25% cortical healing.  Imaging: No results found. No images are attached to the encounter.  Labs: Lab Results  Component Value Date   HGBA1C 7.4 (H) 04/25/2014   HGBA1C 7.4 (H) 04/25/2014   ESRSEDRATE 9 09/26/2019   ESRSEDRATE 12 09/09/2017   ESRSEDRATE 58 (H) 06/01/2014   REPTSTATUS 10/08/2020 FINAL 10/03/2020   GRAMSTAIN  10/03/2020    FEW WBC PRESENT, PREDOMINANTLY MONONUCLEAR NO ORGANISMS SEEN    CULT   10/03/2020    NO ANAEROBES ISOLATED Performed at [facility], [place] [address], [place], [place] [address]      Lab Results  Component Value Date   ALBUMIN 3.7 03/15/2018   ALBUMIN 3.7 09/09/2017   ALBUMIN 4.2 03/30/2017    Lab Results  Component Value Date   MG 2.3 04/28/2014   MG 2.4 04/28/2014   MG 2.7 (H) 04/27/2014   Lab Results  Component Value Date   VD25OH 57 06/22/2019    No results found for: "PREALBUMIN"    Latest Ref Rng & Units 04/01/2021    3:37 PM 10/03/2020    6:45 AM 12/19/2019    9:16 AM  CBC EXTENDED  WBC 4.0 - 10.5 K/uL  12.9  10.5   RBC 4.22 - 5.81 MIL/uL  3.79  4.27   Hemoglobin 13.0 - 17.7 g/dL 11.3  11.7  13.1   HCT 37.5 - 51.0 % 33.9  36.6  39.4   Platelets 150 - 400 K/uL  275  251   NEUT# 1,500 - 7,800 cells/uL   5,660   Lymph# 850 - 3,900 cells/uL   3,507      There is no height or weight on file to calculate BMI.  Orders:  No orders of the defined types were placed in this encounter.  No orders of the defined types were placed in this encounter.    Procedures: No  procedures performed  Clinical Data: No additional findings.  ROS:  All other systems negative, except as noted in the HPI. Review of Systems  Objective: Vital Signs: There were no vitals taken for this visit.  Specialty Comments:  No specialty comments available.  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 03/22/2020   Osteopenia of multiple sites 02/28/2020   PVD (peripheral vascular disease) ([place]) 10/11/2019   Chronic venous insufficiency 10/05/2018   Diabetic cataract ([place]) 05/21/2018   Controlled type 1 diabetes mellitus with diabetic peripheral angiopathy without gangrene ([place]) 08/14/2017   Dyslipidemia 03/26/2017   High risk medication use 09/23/2016   History of hypertension  09/23/2016   History of chronic kidney disease 09/23/2016   History of coronary artery disease 09/23/2016   Tobacco abuse 09/23/2016   Primary osteoarthritis of both knees 09/23/2016   History of diabetes mellitus 09/23/2016   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 ([place]) 11/20/2015   Chest pain with high risk for cardiac etiology 10/29/2015   Asymptomatic bilateral carotid artery stenosis 06/08/2014   Pleural effusion, bilateral 06/03/2014   Dyspnea 06/01/2014   Diabetes ([place]) 05/03/2014   Rheumatoid arthritis involving multiple joints ([place]) 05/03/2014   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   Chronic renal disease, stage 3, moderately decreased glomerular filtration rate between 30-59 mL/min/1.73 square meter ([place]) 05/03/2014   CAD (coronary artery disease) 04/27/2014   Acne 12/19/2013   On isotretinoin therapy 12/19/2013   Nuclear sclerotic cataract, bilateral 12/19/2011   Vitreous hemorrhage ([place]) 06/16/2011   Past Medical History:  Diagnosis Date   Anginal pain (HCC)    Anxiety    Arthritis    RA IN HANDS   Asthma    CAD (coronary artery disease) 04/27/2014   Chronic renal disease, stage 3, moderately decreased glomerular filtration rate between 30-59 mL/min/1.73 square meter ([place]) 05/03/2014   COPD (chronic obstructive pulmonary disease) (HCC)    Coronary artery disease    Diabetes ([place]) 05/03/2014   Diabetes mellitus without complication (HCC)    type 1   Hyperlipidemia    Left shoulder pain    Rheumatoid arthritis involving multiple joints ([name]) 05/03/2014   Shortness of breath    Sleep apnea    mild OSA, no CPAP   Unstable angina pectoris ([place]) 04/25/2014    Family History  Problem Relation Age of Onset   Stomach cancer Mother    Cancer Mother    Prostate cancer Father    Heart disease Father    Cancer - Prostate Father    Heart disease Brother    Asthma Daughter    Healthy  Daughter    Asthma Daughter     Past Surgical History:  Procedure Laterality Date   CARDIAC CATHETERIZATION  04/25/2014   BY DR Vaughn N/A 10/30/2015   Procedure: Left Heart Cath and Cors/Grafts Angiography;  Surgeon: Adrian Prows, MD;  Location: [facility];  Service: Cardiovascular;  Laterality: N/A;   CARPAL TUNNEL RELEASE     CATARACT EXTRACTION, BILATERAL     CORONARY ARTERY BYPASS GRAFT N/A 04/27/2014   Procedure: CORONARY ARTERY BYPASS GRAFTING on pump using left internal mammary artery to LAD coronary artery, right great saphenous vein graft to diagonal coronary artery with sequential to OM1 and  circumflex coronary arteries. Right greater saphenous vein graft to posterior descending coronary artery. ;  Surgeon: Grace Isaac, MD;  Location: [facility];  Service: Open Heart Surgery;  Laterality: N   elbow drained Left 05/09/2019   ENDOVEIN HARVEST OF GREATER SAPHENOUS VEIN Right 04/27/2014   Procedure: ENDOVEIN HARVEST OF GREATER SAPHENOUS VEIN;  Surgeon: Grace Isaac, MD;  Location: [facility];  Service: Open Heart Surgery;  Laterality: Right;   EXCISION ORAL TUMOR N/A 03/01/2018   Procedure: EXCISION ORAL TUMOR;  Surgeon: Melida Quitter, MD;  Location: [facility];  Service: ENT;  Laterality: N/A;   EYE SURGERY     LASER   EYE SURGERY Bilateral    astigmatism correction    HARDWARE REMOVAL Left 10/03/2020   Procedure: LEFT FOOT REMOVAL HARDWARE, PLACE VANC POWDER;  Surgeon: Newt Minion, MD;  Location: [facility];  Service: Orthopedics;  Laterality: Left;   INTRAOPERATIVE TRANSESOPHAGEAL ECHOCARDIOGRAM N/A 04/27/2014   Procedure: INTRAOPERATIVE TRANSESOPHAGEAL ECHOCARDIOGRAM;  Surgeon: Grace Isaac, MD;  Location: [facility];  Service: Open Heart Surgery;  Laterality: N/A;   LEFT HEART CATHETERIZATION WITH CORONARY ANGIOGRAM N/A 04/25/2014   Procedure: LEFT HEART CATHETERIZATION WITH CORONARY ANGIOGRAM;  Surgeon: Laverda Page, MD;  Location: [facility]  CATH LAB;  Service: Cardiovascular;  Laterality: N/A;   MOUTH SURGERY  11/15/2017   tongue surgery    ORIF TOE FRACTURE Left 03/22/2020   Procedure: OPEN REDUCTION INTERNAL FIXATION (ORIF) Non Union 5th Metatarsal;  Surgeon: Mcarthur Rossetti, MD;  Location: [facility];  Service: Orthopedics;  Laterality: Left;   Social History   Occupational History   Not on file  Tobacco Use   Smoking status: Some Days    Packs/day: 0.25    Years: 34.00    Total pack years: 8.50    Types: Cigarettes   Smokeless tobacco: Never  Vaping Use   Vaping Use: Never used  Substance and Sexual Activity   Alcohol use: Yes    Alcohol/week: 0.0 standard drinks of alcohol    Comment: RARE   Drug use: No   Sexual activity: Not on file

## 2022-10-13 ENCOUNTER — Ambulatory Visit: Payer: 59 | Admitting: Podiatry

## 2022-10-16 ENCOUNTER — Other Ambulatory Visit: Payer: Self-pay | Admitting: Internal Medicine

## 2022-10-16 ENCOUNTER — Encounter: Payer: Self-pay | Admitting: Podiatry

## 2022-10-16 ENCOUNTER — Ambulatory Visit: Payer: 59 | Admitting: Podiatry

## 2022-10-16 DIAGNOSIS — B351 Tinea unguium: Secondary | ICD-10-CM | POA: Diagnosis not present

## 2022-10-16 DIAGNOSIS — M79676 Pain in unspecified toe(s): Secondary | ICD-10-CM | POA: Diagnosis not present

## 2022-10-16 DIAGNOSIS — E1042 Type 1 diabetes mellitus with diabetic polyneuropathy: Secondary | ICD-10-CM

## 2022-10-16 NOTE — Progress Notes (Signed)
He presents today chief complaint of painful elongated toenails.  He also wants me to take a look at his left fifth metatarsal because of the pain associated with that has recently had a CT scan by Dr. Sharol Given.  Also we are going to look at the pathology report regarding his toenails.  Objective: Vitals are stable alert oriented x 3 pulses are minimally palpable bilateral toenails are long thick yellow dystrophic with mycotic pathology report does demonstrate initially on Histopath slide no sign of fungus.  The PCR does demonstrate a very mild concentration of saprophytic fungus.  CT scan of the fifth metatarsal area does demonstrate nonunion of the fifth metatarsal base with a small area that appears to be united sore on the dorsal medial aspect of the fifth met.  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  Plan: Discussed etiology pathology and surgical therapies at this point we recommended nail debridement which we did today.  Also recommended that he not take any medication for the saprophytic fungus I did discuss laser therapy with him and he will do that he would like to hold off on that at this time.  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

## 2022-11-23 ENCOUNTER — Other Ambulatory Visit: Payer: Self-pay | Admitting: Internal Medicine

## 2022-12-04 ENCOUNTER — Other Ambulatory Visit: Payer: Self-pay | Admitting: Cardiology

## 2022-12-11 ENCOUNTER — Encounter: Payer: Self-pay | Admitting: Orthopedic Surgery

## 2022-12-11 ENCOUNTER — Ambulatory Visit: Payer: 59 | Admitting: Orthopedic Surgery

## 2022-12-11 DIAGNOSIS — L97421 Non-pressure chronic ulcer of left heel and midfoot limited to breakdown of skin: Secondary | ICD-10-CM | POA: Diagnosis not present

## 2022-12-11 MED ORDER — PENTOXIFYLLINE ER 400 MG PO TBCR: 400.0000 mg | EXTENDED_RELEASE_TABLET | Freq: Three times a day (TID) | ORAL | 3 refills | Status: DC

## 2022-12-11 MED ORDER — NITROGLYCERIN 0.2 MG/HR TD PT24
0.2000 mg | MEDICATED_PATCH | Freq: Every day | TRANSDERMAL | 12 refills | Status: DC
Start: 2022-12-11 — End: 2023-02-26

## 2022-12-11 NOTE — Progress Notes (Signed)
Office Visit Note   Patient: [name]           Date of Birth: Jul 02, 1966           MRN: [id number] Visit Date: 12/11/2022              Requested by: Garlan Fillers, MD [address] [place],  [place] [address] PCP: Garlan Fillers, MD  Chief Complaint  Patient presents with   Left Foot - Follow-up      HPI: Patient is a 57 year old gentleman who is seen for initial evaluation for a acute ulcer posterior aspect left Achilles.  Patient has started doxycycline and Bactroban ointment dressing changes.  Patient is in knee-high socks and Hoka sneakers.  He has been wearing a elastic ASO.  Patient states that he noticed the ulcer Sunday morning.  Assessment & Plan: Visit Diagnoses:  1. Heel ulcer, left, limited to breakdown of skin [facility])     Plan: Patient will continue with his antibiotic coverage a prescription was provided for  Trental and nitroglycerin patch to promote microcirculation.  Patient will hold on his DM AR.  Follow-Up Instructions: Return in about 1 week (around 12/18/2022).   Ortho Exam  Patient is alert, oriented, no adenopathy, well-dressed, normal affect, normal respiratory effort. Examination patient has a strong palpable dorsalis pedis and posterior tibial pulse.  The lateral foot incision is well-healed there is no redness or cellulitis.  Patient has a black necrotic ulcer over the Achilles which is 1 cm in diameter.  There are no palpable defects of the Achilles.  Imaging: No results found. No images are attached to the encounter.  Labs: Lab Results  Component Value Date   HGBA1C 7.4 (H) 04/25/2014   HGBA1C 7.4 (H) 04/25/2014   ESRSEDRATE 9 09/26/2019   ESRSEDRATE 12 09/09/2017   ESRSEDRATE 58 (H) 06/01/2014   REPTSTATUS 10/08/2020 FINAL 10/03/2020   GRAMSTAIN  10/03/2020    FEW WBC PRESENT, PREDOMINANTLY MONONUCLEAR NO ORGANISMS SEEN    CULT  10/03/2020    NO ANAEROBES ISOLATED Performed at [facility] Lab, [address] [place], [place] [address]      Lab Results  Component Value Date   ALBUMIN 3.7 03/15/2018   ALBUMIN 3.7 09/09/2017   ALBUMIN 4.2 03/30/2017    Lab Results  Component Value Date   MG 2.3 04/28/2014   MG 2.4 04/28/2014   MG 2.7 (H) 04/27/2014   Lab Results  Component Value Date   VD25OH 57 06/22/2019    No results found for: "PREALBUMIN"    Latest Ref Rng & Units 04/01/2021    3:37 PM 10/03/2020    6:45 AM 12/19/2019    9:16 AM  CBC EXTENDED  WBC 4.0 - 10.5 K/uL  12.9  10.5   RBC 4.22 - 5.81 MIL/uL  3.79  4.27   Hemoglobin 13.0 - 17.7 g/dL 40.3  47.4  25.9   HCT 37.5 - 51.0 % 33.9  36.6  39.4   Platelets 150 - 400 K/uL  275  251   NEUT# 1,500 - 7,800 cells/uL   5,660   Lymph# 850 - 3,900 cells/uL   3,507      There is no height or weight on file to calculate BMI.  Orders:  No orders of the defined types were placed in this encounter.  Meds ordered this encounter  Medications   nitroGLYCERIN (NITRODUR - DOSED IN MG/24 HR) 0.2 mg/hr patch    Sig: Place 1  patch (0.2 mg total) onto the skin daily.    Dispense:  30 patch    Refill:  12   pentoxifylline (TRENTAL) 400 MG CR tablet    Sig: Take 1 tablet (400 mg total) by mouth 3 (three) times daily with meals.    Dispense:  90 tablet    Refill:  3     Procedures: No procedures performed  Clinical Data: No additional findings.  ROS:  All other systems negative, except as noted in the HPI. Review of Systems  Objective: Vital Signs: There were no vitals taken for this visit.  Specialty Comments:  No specialty comments available.  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 03/22/2020   Osteopenia of multiple sites 02/28/2020   PVD (peripheral vascular disease) (HCC) 10/11/2019   Chronic venous insufficiency 10/05/2018   Diabetic cataract  (HCC) 05/21/2018   Controlled type 1 diabetes mellitus with diabetic peripheral angiopathy without gangrene (HCC) 08/14/2017   Dyslipidemia 03/26/2017   High risk medication use 09/23/2016   History of hypertension 09/23/2016   History of chronic kidney disease 09/23/2016   History of coronary artery disease 09/23/2016   Tobacco abuse 09/23/2016   Primary osteoarthritis of both knees 09/23/2016   History of diabetes mellitus 09/23/2016   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 (HCC) 11/20/2015   Chest pain with high risk for cardiac etiology 10/29/2015   Asymptomatic bilateral carotid artery stenosis 06/08/2014   Pleural effusion, bilateral 06/03/2014   Dyspnea 06/01/2014   Diabetes (HCC) 05/03/2014   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/03/2014   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   Chronic renal disease, stage 3, moderately decreased glomerular filtration rate between 30-59 mL/min/1.73 square meter (HCC) 05/03/2014   CAD (coronary artery disease) 04/27/2014   Acne 12/19/2013   On isotretinoin therapy 12/19/2013   Nuclear sclerotic cataract, bilateral 12/19/2011   Vitreous hemorrhage (HCC) 06/16/2011   Past Medical History:  Diagnosis Date   Anginal pain (HCC)    Anxiety    Arthritis    RA IN HANDS   Asthma    CAD (coronary artery disease) 04/27/2014   Chronic renal disease, stage 3, moderately decreased glomerular filtration rate between 30-59 mL/min/1.73 square meter (HCC) 05/03/2014   COPD (chronic obstructive pulmonary disease) (HCC)    Coronary artery disease    Diabetes (HCC) 05/03/2014   Diabetes mellitus without complication (HCC)    type 1   Hyperlipidemia    Left shoulder pain    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/03/2014   Shortness of breath    Sleep apnea    mild OSA, no CPAP   Unstable angina pectoris (HCC) 04/25/2014    Family  History  Problem Relation Age of Onset   Stomach cancer Mother    Cancer Mother    Prostate cancer Father    Heart disease Father    Cancer - Prostate Father    Heart disease Brother    Asthma Daughter    Healthy Daughter    Asthma Daughter     Past Surgical History:  Procedure Laterality Date   CARDIAC CATHETERIZATION  04/25/2014   BY DR Jacinto Halim   CARDIAC CATHETERIZATION N/A 10/30/2015   Procedure: Left Heart Cath and Cors/Grafts Angiography;  Surgeon: Yates Decamp, MD;  Location: [facility] INVASIVE CV LAB;  Service: Cardiovascular;  Laterality: N/A;   CARPAL TUNNEL RELEASE     CATARACT EXTRACTION, BILATERAL     CORONARY ARTERY BYPASS GRAFT N/A 04/27/2014   Procedure: CORONARY ARTERY BYPASS GRAFTING on pump using left internal mammary artery to LAD coronary artery, right great saphenous vein graft to diagonal coronary artery with sequential to OM1 and circumflex coronary arteries. Right greater saphenous vein graft to posterior descending coronary artery. ;  Surgeon: Delight Ovens, MD;  Location: [facility] OR;  Service: Open Heart Surgery;  Laterality: N   elbow drained Left 05/09/2019   ENDOVEIN HARVEST OF GREATER SAPHENOUS VEIN Right 04/27/2014   Procedure: ENDOVEIN HARVEST OF GREATER SAPHENOUS VEIN;  Surgeon: Delight Ovens, MD;  Location: [facility] OR;  Service: Open Heart Surgery;  Laterality: Right;   EXCISION ORAL TUMOR N/A 03/01/2018   Procedure: EXCISION ORAL TUMOR;  Surgeon: Christia Reading, MD;  Location: [facility];  Service: ENT;  Laterality: N/A;   EYE SURGERY     LASER   EYE SURGERY Bilateral    astigmatism correction    HARDWARE REMOVAL Left 10/03/2020   Procedure: LEFT FOOT REMOVAL HARDWARE, PLACE VANC POWDER;  Surgeon: Nadara Mustard, MD;  Location: [facility] OR;  Service: Orthopedics;  Laterality: Left;   INTRAOPERATIVE TRANSESOPHAGEAL ECHOCARDIOGRAM N/A 04/27/2014   Procedure: INTRAOPERATIVE TRANSESOPHAGEAL ECHOCARDIOGRAM;  Surgeon: Delight Ovens, MD;  Location: [facility] OR;  Service:  Open Heart Surgery;  Laterality: N/A;   LEFT HEART CATHETERIZATION WITH CORONARY ANGIOGRAM N/A 04/25/2014   Procedure: LEFT HEART CATHETERIZATION WITH CORONARY ANGIOGRAM;  Surgeon: Pamella Pert, MD;  Location: [facility] CATH LAB;  Service: Cardiovascular;  Laterality: N/A;   MOUTH SURGERY  11/15/2017   tongue surgery    ORIF TOE FRACTURE Left 03/22/2020   Procedure: OPEN REDUCTION INTERNAL FIXATION (ORIF) Non Union 5th Metatarsal;  Surgeon: Kathryne Hitch, MD;  Location: [facility];  Service: Orthopedics;  Laterality: Left;   Social History   Occupational History   Not on file  Tobacco Use   Smoking status: Some Days    Packs/day: 0.25    Years: 34.00    Additional pack years: 0.00    Total pack years: 8.50    Types: Cigarettes   Smokeless tobacco: Never  Vaping Use   Vaping Use: Never used  Substance and Sexual Activity   Alcohol use: Yes    Alcohol/week: 0.0 standard drinks of alcohol    Comment: RARE   Drug use: No   Sexual activity: Not on file

## 2022-12-18 ENCOUNTER — Ambulatory Visit: Payer: 59 | Admitting: Orthopedic Surgery

## 2022-12-18 DIAGNOSIS — L97421 Non-pressure chronic ulcer of left heel and midfoot limited to breakdown of skin: Secondary | ICD-10-CM

## 2022-12-18 MED ORDER — SULFAMETHOXAZOLE-TRIMETHOPRIM 800-160 MG PO TABS: 1.0000 | ORAL_TABLET | Freq: Two times a day (BID) | ORAL | 0 refills | Status: DC

## 2022-12-18 NOTE — Progress Notes (Signed)
Office Visit Note   Patient: [name]           Date of Birth: 01/03/1966           MRN: [id number] Visit Date: 12/18/2022              Requested by: Garlan Fillers, MD [address] [place],  [place] [address] PCP: Garlan Fillers, MD  Chief Complaint  Patient presents with   Left Foot - Follow-up      HPI: Patient is a 57 year old gentleman who is seen for an acute left Achilles ulcer follow-up.  Patient has been on doxycycline and Bactroban.  He started the nitroglycerin patch yesterday.  Currently wearing knee-high compression socks and Hoka sneakers.  Doxycycline started with Dr. Jarold Motto.  Assessment & Plan: Visit Diagnoses: No diagnosis found.  Plan: Patient is given a prescription for Septra DS he will continue nitroglycerin patch.  Follow-Up Instructions: No follow-ups on file.   Ortho Exam  Patient is alert, oriented, no adenopathy, well-dressed, normal affect, normal respiratory effort. Examination patient has a good dorsalis pedis pulse.  He has an area of cellulitis that is 2.5 cm in diameter over the Achilles.  There is no tenderness to palpation over the Achilles no nodules or defects no insufficiency of the Achilles.  The black eschar is 1 cm diameter.  Imaging: No results found. No images are attached to the encounter.  Labs: Lab Results  Component Value Date   HGBA1C 7.4 (H) 04/25/2014   HGBA1C 7.4 (H) 04/25/2014   ESRSEDRATE 9 09/26/2019   ESRSEDRATE 12 09/09/2017   ESRSEDRATE 58 (H) 06/01/2014   REPTSTATUS 10/08/2020 FINAL 10/03/2020   GRAMSTAIN  10/03/2020    FEW WBC PRESENT, PREDOMINANTLY MONONUCLEAR NO ORGANISMS SEEN    CULT  10/03/2020    NO ANAEROBES ISOLATED Performed at [facility], [address], [place], [place] [address]      Lab Results  Component Value Date   ALBUMIN 3.7 03/15/2018   ALBUMIN 3.7 09/09/2017   ALBUMIN 4.2 03/30/2017    Lab Results  Component Value Date   MG 2.3 04/28/2014   MG  2.4 04/28/2014   MG 2.7 (H) 04/27/2014   Lab Results  Component Value Date   VD25OH 57 06/22/2019    No results found for: "PREALBUMIN"    Latest Ref Rng & Units 04/01/2021    3:37 PM 10/03/2020    6:45 AM 12/19/2019    9:16 AM  CBC EXTENDED  WBC 4.0 - 10.5 K/uL  12.9  10.5   RBC 4.22 - 5.81 MIL/uL  3.79  4.27   Hemoglobin 13.0 - 17.7 g/dL 82.9  56.2  13.0   HCT 37.5 - 51.0 % 33.9  36.6  39.4   Platelets 150 - 400 K/uL  275  251   NEUT# 1,500 - 7,800 cells/uL   5,660   Lymph# 850 - 3,900 cells/uL   3,507      There is no height or weight on file to calculate BMI.  Orders:  No orders of the defined types were placed in this encounter.  Meds ordered this encounter  Medications   sulfamethoxazole-trimethoprim (BACTRIM DS) 800-160 MG tablet    Sig: Take 1 tablet by mouth 2 (two) times daily.    Dispense:  20 tablet    Refill:  0     Procedures: No procedures performed  Clinical Data: No additional findings.  ROS:  All other systems negative, except as  noted in the HPI. Review of Systems  Objective: Vital Signs: There were no vitals taken for this visit.  Specialty Comments:  No specialty comments available.  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 03/22/2020   Osteopenia of multiple sites 02/28/2020   PVD (peripheral vascular disease) (HCC) 10/11/2019   Chronic venous insufficiency 10/05/2018   Diabetic cataract (HCC) 05/21/2018   Controlled type 1 diabetes mellitus with diabetic peripheral angiopathy without gangrene (HCC) 08/14/2017   Dyslipidemia 03/26/2017   High risk medication use 09/23/2016   History of hypertension 09/23/2016   History of chronic kidney disease 09/23/2016   History of coronary artery disease 09/23/2016   Tobacco abuse 09/23/2016   Primary osteoarthritis of both  knees 09/23/2016   History of diabetes mellitus 09/23/2016   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 (HCC) 11/20/2015   Chest pain with high risk for cardiac etiology 10/29/2015   Asymptomatic bilateral carotid artery stenosis 06/08/2014   Pleural effusion, bilateral 06/03/2014   Dyspnea 06/01/2014   Diabetes (HCC) 05/03/2014   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/03/2014   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   Chronic renal disease, stage 3, moderately decreased glomerular filtration rate between 30-59 mL/min/1.73 square meter (HCC) 05/03/2014   CAD (coronary artery disease) 04/27/2014   Acne 12/19/2013   On isotretinoin therapy 12/19/2013   Nuclear sclerotic cataract, bilateral 12/19/2011   Vitreous hemorrhage (HCC) 06/16/2011   Past Medical History:  Diagnosis Date   Anginal pain (HCC)    Anxiety    Arthritis    RA IN HANDS   Asthma    CAD (coronary artery disease) 04/27/2014   Chronic renal disease, stage 3, moderately decreased glomerular filtration rate between 30-59 mL/min/1.73 square meter (HCC) 05/03/2014   COPD (chronic obstructive pulmonary disease) (HCC)    Coronary artery disease    Diabetes (HCC) 05/03/2014   Diabetes mellitus without complication (HCC)    type 1   Hyperlipidemia    Left shoulder pain    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/03/2014   Shortness of breath    Sleep apnea    mild OSA, no CPAP   Unstable angina pectoris (HCC) 04/25/2014    Family History  Problem Relation Age of Onset   Stomach cancer Mother    Cancer Mother    Prostate cancer Father    Heart disease Father    Cancer - Prostate Father    Heart disease Brother    Asthma Daughter    Healthy Daughter    Asthma Daughter     Past Surgical History:  Procedure Laterality Date   CARDIAC CATHETERIZATION  04/25/2014   BY DR Jacinto Halim   CARDIAC CATHETERIZATION N/A  10/30/2015   Procedure: Left Heart Cath and Cors/Grafts Angiography;  Surgeon: Yates Decamp, MD;  [facility] INVASIVE CV LAB;  Service: Cardiovascular;  Laterality: N/A;   CARPAL TUNNEL RELEASE     CATARACT EXTRACTION, BILATERAL     CORONARY ARTERY BYPASS GRAFT N/A 04/27/2014   Procedure: CORONARY ARTERY BYPASS GRAFTING on pump using left internal mammary artery to LAD coronary artery, right great saphenous vein graft to diagonal coronary artery with sequential to OM1 and circumflex coronary arteries. Right greater saphenous vein graft to posterior descending coronary artery. ;  Surgeon: Ramon Dredge  Bari Edward, MD;  Location: [facility] OR;  Service: Open Heart Surgery;  Laterality: N   elbow drained Left 05/09/2019   ENDOVEIN HARVEST OF GREATER SAPHENOUS VEIN Right 04/27/2014   Procedure: ENDOVEIN HARVEST OF GREATER SAPHENOUS VEIN;  Surgeon: Delight Ovens, MD;  Location: [facility] OR;  Service: Open Heart Surgery;  Laterality: Right;   EXCISION ORAL TUMOR N/A 03/01/2018   Procedure: EXCISION ORAL TUMOR;  Surgeon: Christia Reading, MD;  Location: [facility];  Service: ENT;  Laterality: N/A;   EYE SURGERY     LASER   EYE SURGERY Bilateral    astigmatism correction    HARDWARE REMOVAL Left 10/03/2020   Procedure: LEFT FOOT REMOVAL HARDWARE, PLACE VANC POWDER;  Surgeon: Nadara Mustard, MD;  Location: [facility] OR;  Service: Orthopedics;  Laterality: Left;   INTRAOPERATIVE TRANSESOPHAGEAL ECHOCARDIOGRAM N/A 04/27/2014   Procedure: INTRAOPERATIVE TRANSESOPHAGEAL ECHOCARDIOGRAM;  Surgeon: Delight Ovens, MD;  Location: [facility] OR;  Service: Open Heart Surgery;  Laterality: N/A;   LEFT HEART CATHETERIZATION WITH CORONARY ANGIOGRAM N/A 04/25/2014   Procedure: LEFT HEART CATHETERIZATION WITH CORONARY ANGIOGRAM;  Surgeon: Pamella Pert, MD;  Location: [facility] CATH LAB;  Service: Cardiovascular;  Laterality: N/A;   MOUTH SURGERY  11/15/2017   tongue surgery    ORIF TOE FRACTURE Left 03/22/2020   Procedure: OPEN REDUCTION  INTERNAL FIXATION (ORIF) Non Union 5th Metatarsal;  Surgeon: Kathryne Hitch, MD;  Location: [facility];  Service: Orthopedics;  Laterality: Left;   Social History   Occupational History   Not on file  Tobacco Use   Smoking status: Some Days    Packs/day: 0.25    Years: 34.00    Additional pack years: 0.00    Total pack years: 8.50    Types: Cigarettes   Smokeless tobacco: Never  Vaping Use   Vaping Use: Never used  Substance and Sexual Activity   Alcohol use: Yes    Alcohol/week: 0.0 standard drinks of alcohol    Comment: RARE   Drug use: No   Sexual activity: Not on file

## 2022-12-19 ENCOUNTER — Other Ambulatory Visit: Payer: Self-pay | Admitting: Internal Medicine

## 2022-12-19 DIAGNOSIS — I739 Peripheral vascular disease, unspecified: Secondary | ICD-10-CM

## 2022-12-22 ENCOUNTER — Ambulatory Visit
Admission: RE | Admit: 2022-12-22 | Discharge: 2022-12-22 | Disposition: A | Discharge status: Home / Self Care | Payer: 59 | Source: Ambulatory Visit | Attending: Internal Medicine | Admitting: Internal Medicine

## 2022-12-22 ENCOUNTER — Other Ambulatory Visit: Payer: Self-pay | Admitting: Interventional Radiology

## 2022-12-22 DIAGNOSIS — I739 Peripheral vascular disease, unspecified: Secondary | ICD-10-CM

## 2022-12-22 HISTORY — PX: IR RADIOLOGIST EVAL & MGMT: IMG5224

## 2022-12-22 NOTE — Consult Note (Signed)
Chief Complaint: Left foot wound  Referring Physician(s): Paterson,Daniel G  History of Present Illness: [name] is a 57 y.o. male presenting today as scheduled consultation to [facility] clinic, kindly referred by Dr. Eloise Harman, for evaluation of left foot wound and possible revascularization.   Mr [name] is here today by himself for the interview.   He tells me that about 2 weeks ago or so he looked down at his foot while bathing and discovered a wound on the posterior left ankle, overlying the achilles.  He does not remember any trauma.  He has since had appointment with Dr. Eloise Harman and also with orthopedic surgery.   He has pending appointment this week with [name].    I cannot elicit any history of claudication.  I cannot elicit any history of resting/night pain. He denies any previous such wound of the left or right foot.   He has multiple CV risk factors, including: DM 1 (from childhood), smoking (had quit, and is smoking again), CAD, HLD, CKD, HTN.   He has history of CABG, Dr. Tyrone Sage 04/27/2014 Cardiac cath: Dr. Jacinto Halim 10/30/2015   He is taking zocor He is not taking antiplatelet.  (He was on aspirin, but had a GI bleed on a trip to [facility] few years ago, and has since been off) He has not been successful quitting tobacco, having started after divorce.   He denies any fevers/rigors/chills.   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   His last non-invasive exam was performed 10/05/2018.  Right ABI: [place] TBI: 0.68 Left ABI: [place] TBI: 0.67  Past Medical History:  Diagnosis Date   Anginal pain (HCC)    Anxiety    Arthritis    RA IN HANDS   Asthma    CAD (coronary artery disease) 04/27/2014   Chronic renal disease, stage 3, moderately decreased glomerular filtration rate between 30-59 mL/min/1.73 square meter (HCC) 05/03/2014   COPD (chronic obstructive pulmonary  disease) (HCC)    Coronary artery disease    Diabetes (HCC) 05/03/2014   Diabetes mellitus without complication (HCC)    type 1   Hyperlipidemia    Left shoulder pain    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/03/2014   Shortness of breath    Sleep apnea    mild OSA, no CPAP   Unstable angina pectoris (HCC) 04/25/2014    Past Surgical History:  Procedure Laterality Date   CARDIAC CATHETERIZATION  04/25/2014   BY DR Jacinto Halim   CARDIAC CATHETERIZATION N/A 10/30/2015   Procedure: Left Heart Cath and Cors/Grafts Angiography;  Surgeon: Yates Decamp, MD;  Location: [facility] INVASIVE CV LAB;  Service: Cardiovascular;  Laterality: N/A;   CARPAL TUNNEL RELEASE     CATARACT EXTRACTION, BILATERAL     CORONARY ARTERY BYPASS GRAFT N/A 04/27/2014   Procedure: CORONARY ARTERY BYPASS GRAFTING on pump using left internal mammary artery to LAD coronary artery, right great saphenous vein graft to diagonal coronary artery with sequential to OM1 and circumflex coronary arteries. Right greater saphenous vein graft to posterior descending coronary artery. ;  Surgeon: Delight Ovens, MD;  Location: [facility];  Service: Open Heart Surgery;  Laterality: N   elbow drained Left 05/09/2019   ENDOVEIN HARVEST OF GREATER SAPHENOUS VEIN Right 04/27/2014   Procedure: ENDOVEIN HARVEST OF GREATER SAPHENOUS VEIN;  Surgeon: Delight Ovens, MD;  Location: [facility] OR;  Service: Open Heart Surgery;  Laterality: Right;  EXCISION ORAL TUMOR N/A 03/01/2018   Procedure: EXCISION ORAL TUMOR;  Surgeon: Christia Reading, MD;  Location: [facility];  Service: ENT;  Laterality: N/A;   EYE SURGERY     LASER   EYE SURGERY Bilateral    astigmatism correction    HARDWARE REMOVAL Left 10/03/2020   Procedure: LEFT FOOT REMOVAL HARDWARE, PLACE VANC POWDER;  Surgeon: Nadara Mustard, MD;  Location: [facility] OR;  Service: Orthopedics;  Laterality: Left;   INTRAOPERATIVE TRANSESOPHAGEAL ECHOCARDIOGRAM N/A 04/27/2014   Procedure: INTRAOPERATIVE  TRANSESOPHAGEAL ECHOCARDIOGRAM;  Surgeon: Delight Ovens, MD;  Location: [facility] OR;  Service: Open Heart Surgery;  Laterality: N/A;   LEFT HEART CATHETERIZATION WITH CORONARY ANGIOGRAM N/A 04/25/2014   Procedure: LEFT HEART CATHETERIZATION WITH CORONARY ANGIOGRAM;  Surgeon: Pamella Pert, MD;  Location: [facility] CATH LAB;  Service: Cardiovascular;  Laterality: N/A;   MOUTH SURGERY  11/15/2017   tongue surgery    ORIF TOE FRACTURE Left 03/22/2020   Procedure: OPEN REDUCTION INTERNAL FIXATION (ORIF) Non Union 5th Metatarsal;  Surgeon: Kathryne Hitch, MD;  Location: [facility];  Service: Orthopedics;  Laterality: Left;    Allergies: Metoprolol, Niacin and related, and Tramadol  Medications: Prior to Admission medications   Medication Sig Start Date End Date Taking? Authorizing Provider  albuterol (VENTOLIN HFA) 108 (90 Base) MCG/ACT inhaler INHALE 2 PUFFS BY MOUTH EVERY 6 HOURS AS NEEDED FOR WHEEZING 11/24/22   Charlott Holler, MD  Ascorbic Acid (VITAMIN C) 1000 MG tablet Take 1,000 mg by mouth daily.    [provider]  bisoprolol (ZEBETA) 5 MG tablet TAKE ONE-HALF TABLET BY MOUTH  DAILY 12/04/22   Yates Decamp, MD  Blood Glucose Monitoring Suppl (ONETOUCH VERIO FLEX SYSTEM) w/Device KIT USE TO CHECK BLOOD GLUCOSE UP TO 10 TIMES PER DAY 02/01/20   [provider]  budesonide-formoterol (SYMBICORT) 80-4.5 MCG/ACT inhaler Inhale 2 puffs into the lungs in the morning and at bedtime. 05/26/22   Charlott Holler, MD  [facility]) 150 MG 12 hr tablet TAKE 1 TABLET BY MOUTH DAILY 09/29/22   Yates Decamp, MD  cetirizine (ZYRTEC) 10 MG tablet TAKE 1 TABLET BY MOUTH AT BEDTIME 10/23/22   Charlott Holler, MD  Cholecalciferol (VITAMIN D3) 125 MCG (5000 UT) TABS Take 5,000 Units by mouth daily.    [provider]  COD LIVER OIL PO Take 415 mg by mouth daily.    [provider]  Continuous Blood Gluc Sensor (FREESTYLE LIBRE 14 DAY SENSOR) MISC CHANGE  EVERY 14 DAYS TO MONITOR BLOOD GLUCOSE 09/08/19   [provider]  gabapentin (NEURONTIN) 300 MG capsule Take 1 capsule (300 mg total) by mouth at bedtime. 02/07/20   Pollyann Savoy, MD  HUMALOG 100 UNIT/ML injection Inject 15-20 Units into the skin 3 (three) times daily with meals. Sliding scale  Depending on carb intake 01/25/20   [provider]  HUMIRA PEN 40 MG/0.4ML PNKT SMARTSIG:40 Milligram(s) SUB-Q Every 2 Weeks 07/06/22   [provider]  Hydrocortisone, Perianal, 1 % CREA SMARTSIG:Rectally 3 Times Daily PRN 09/13/21   [provider]  insulin glargine (LANTUS) 100 UNIT/ML injection Inject 14-17 Units into the skin See admin instructions. Inject into the skin 17 units in the morning and 14 unit at bedtime 01/03/19   [provider]  ipratropium (ATROVENT) 0.06 % nasal spray Place into both nostrils. 09/10/21   [provider]  Lidocaine 4 % PTCH Place 1 patch onto the skin at  bedtime.    [provider]  LINZESS 145 MCG CAPS capsule Take 145 mcg by mouth every morning. 08/06/21   [provider]  losartan (COZAAR) 25 MG tablet Take 25 mg by mouth daily. 09/16/21   [provider]  magnesium gluconate (MAGONATE) 500 MG tablet Take 500 mg by mouth daily.    [provider]  nitroGLYCERIN (NITRODUR - DOSED IN MG/24 HR) 0.2 mg/hr patch Place 1 patch (0.2 mg total) onto the skin daily. 12/11/22   Nadara Mustard, MD  nitroGLYCERIN (NITROSTAT) 0.4 MG SL tablet DISSOLVE 1 TABLET UNDER THE TONGUE EVERY 5 MINUTES AS  NEEDED FOR CHEST PAIN. MAX  OF 3 TABLETS IN 15 MINUTES. CALL 911 IF PAIN PERSISTS. Patient taking differently: Place 0.4 mg under the tongue every 5 (five) minutes as needed for chest pain. 01/17/20   Yates Decamp, MD  oxymetazoline (AFRIN) 0.05 % nasal spray Place 2 sprays into both nostrils at bedtime.    [provider]  pentoxifylline (TRENTAL) 400 MG CR tablet Take 1 tablet (400 mg total) by mouth 3  (three) times daily with meals. 12/11/22   Nadara Mustard, MD  ranolazine (RANEXA) 500 MG 12 hr tablet Take 250 mg by mouth 2 (two) times daily.    [provider]  simvastatin (ZOCOR) 20 MG tablet TAKE 1 TABLET BY MOUTH AT BEDTIME 10/06/22   Yates Decamp, MD  sulfamethoxazole-trimethoprim (BACTRIM DS) 800-160 MG tablet Take 1 tablet by mouth 2 (two) times daily. 12/18/22   Nadara Mustard, MD  tadalafil (CIALIS) 20 MG tablet Take 20 mg by mouth daily as needed. 06/29/22   [provider]  zinc gluconate 50 MG tablet Take 50 mg by mouth daily.    [provider]     Family History  Problem Relation Age of Onset   Stomach cancer Mother    Cancer Mother    Prostate cancer Father    Heart disease Father    Cancer - Prostate Father    Heart disease Brother    Asthma Daughter    Healthy Daughter    Asthma Daughter     Social History   Socioeconomic History   Marital status: Divorced    Spouse name: Not on file   Number of children: 3   Years of education: Not on file   Highest education level: Not on file  Occupational History   Not on file  Tobacco Use   Smoking status: Some Days    Packs/day: 0.25    Years: 34.00    Additional pack years: 0.00    Total pack years: 8.50    Types: Cigarettes   Smokeless tobacco: Never  Vaping Use   Vaping Use: Never used  Substance and Sexual Activity   Alcohol use: Yes    Alcohol/week: 0.0 standard drinks of alcohol    Comment: RARE   Drug use: No   Sexual activity: Not on file  Other Topics Concern   Not on file  Social History Narrative   Not on file   Social Determinants of Health   Financial Resource Strain: Not on file  Food Insecurity: Not on file  Transportation Needs: Not on file  Physical Activity: Not on file  Stress: Not on file  Social Connections: Not on file       Review of Systems: A 12 point ROS discussed and pertinent positives are indicated in the HPI above.  All other systems are  negative.  Review of  Systems  Vital Signs: BP (!) 133/57 (BP Location: Left Arm, Patient Position: Sitting, Cuff Size: Normal)   Pulse 72   Temp 98.1 F (36.7 C) (Oral)   Wt 90.7 kg   SpO2 95% Comment: room air  BMI 30.41 kg/m     Physical Exam General: 57 yo male appearing stated age.  Well-developed, well-nourished.  No distress. HEENT: Atraumatic, normocephalic.  Conjugate gaze, extra-ocular motor intact. No scleral icterus or scleral injection. No lesions on external ears, nose, lips, or gums.  Oral mucosa moist, pink.  Neck: Symmetric with no goiter enlargement.  Chest/Lungs:  Symmetric chest with inspiration/expiration.  No labored breathing.  Clear to auscultation with no wheezes, rhonchi, or rales.  Heart:  RRR, with no third heart sounds appreciated. No JVD appreciated.  Abdomen:  Soft, NT/ND, with + bowel sounds.   Genito-urinary: Deferred Neurologic: Alert & Oriented to person, place, and time.   Normal affect and insight.  Appropriate questions.  Moving all 4 extremities with gross sensory intact.  Pulse Exam:  No bruit appreciated.   Doppler positive right AT & PT, monophasic.  Doppler positive left AT & PT, monophasic Extremities: Quarter size wound posterior left ankle superior to the calcaneus. Eschar centrally without drainage.  Thin margin of blanching erythema.  Non tender calf.         Imaging: No results found.  Labs:  CBC: No results for input(s): "WBC", "HGB", "HCT", "PLT" in the last 8760 hours.  COAGS: No results for input(s): "INR", "APTT" in the last 8760 hours.  BMP: No results for input(s): "NA", "K", "CL", "CO2", "GLUCOSE", "BUN", "CALCIUM", "CREATININE", "GFRNONAA", "GFRAA" in the last 8760 hours.  Invalid input(s): "CMP"  LIVER FUNCTION TESTS: No results for input(s): "BILITOT", "AST", "ALT", "ALKPHOS", "PROT", "ALBUMIN" in the last 8760 hours.  TUMOR MARKERS: No results for input(s): "AFPTM", "CEA", "CA199", "CHROMGRNA" in the last  8760 hours.  Assessment and Plan:  Assessment:  Taquan Stavropoulos is a 57yo gentleman presenting with left posterior foot diabetic foot wound.   Last non-invasive was ~4 years ago, with non-compressible ABI.    His WIfI risk calculated (with surrogate ABI given non compressible) is moderate risk, with high benefit to revascularization.   He has multiple CV risk factors, including both DM 1 and smoking, and is thus high risk.  His pulse exam at the right and left DP/PT is monophasic doppler signal, indicating proximal disease.   I had a discussion with him regarding anatomy, pathology/pathophysiology, natural history, and prognosis of PAD/CLI.  Informed consent regarding treatment strategies was performed which would includes 2 general categories of treatment: 1) medical management, and 2) surgical or endovascular strategy.   Risk/benefit discussion for both.  The indications for treatment supported by updated guidelines1, 2 were discussed.  Regarding the first tenet of medical management, maximal medical therapy for reduction of risk factors is indicated as recommended by updated AHA guidelines1.  This generally includes anti-platelet medication, tight blood glucose control to a HbA1c < 7, tight blood pressure control, maximum-dose HMG-CoA reductase inhibitor, and smoking cessation.  Smoking cessation was addressed. We also discussed anti-platelet.   Regarding the employment of endovascular options or surgical bypass, we discussed this for addressing quality of life issues or in the setting of CLI.  Regarding endo therapy, specific risks discussed include: bleeding, infection, contrast reaction, renal injury/nephropathy, arterial injury/dissection, need for additional procedure/surgery, worsening symptoms/tissue including limb loss, cardiopulmonary collapse, death.    Given the presence of diabetes, healthy foot care is  indicated, in accord with multi-disciplinary, Class 1 recommendations.1,3  Current recommendations advocate routine foot inspection, good nail care, avoiding barefoot walking, properly fitted footwear, seeking care with problems, and offloading when indicated. He currently has podiatric care.     Annual flu vaccination is also recommended, with Class 1 recommendation1.   After the above, he understands that our recommendation is to improve blood flow with angiogram possible intervention, but I think reasonable to wait until he has initiated wound care this week and has a complete plan.  He is not at imminent limb-loss risk/amputation risk.  He is also clearly pensive/conservative about surgery.    Plan: - We will order updated ABI/segmental exam. Last was 4 years ago.   - He is going to follow through with wound care appointment (this week) and establish complete care plan.  He is currently not at risk for imminent tissue loss. - He is going to consider our recommendation for angiogram and possible intervention.  If he would like to proceed soon, he will call our office.  - We will set him up for a 3 month follow up appointment with Dr. Loreta Ave to check on wound healing progress. We are happy to see him back any earlier if need be or if would like to discuss treatment.  -Recommend maximal medical therapy for cardiovascular risk reduction, including anti-platelet therapy. -Recommend initiating smoking cessation measures   ___________________________________________________________________   1Monte Fantasia MD, et al. 2016 AHA/ACC Guideline on the Management of Patients With Lower Extremity Peripheral Artery Disease: Executive Summary: A Report of the American College of Cardiology/American Heart Association Task Force on Clinical Practice Guidelines. J Am Coll Cardiol. 2017 Mar 21;69(11):1465-1508. doi: 10.1016/j.jacc.2016.11.008.   2 - Norgren L, et al. TASC II Working Group. Inter-society consensus for the management of peripheral arterial disease. Int Nunzio Cobbs. 2007  Jun;26(2):81-157. Review. PubMed PMID: 40981191  3 - Hingorani A, et al. The management of diabetic foot: A clinical practice guideline by the Society for Vascular Surgery in collaboration with the American Podiatric Medical Association and the Society  for Vascular Medicine. J Vasc Surg. 2016 Feb;63(2 Suppl):3S-21S. doi: 10.1016/j.jvs.2015.10.003. PubMed PMID: 47829562.  4 - Luther Hearing, Saab FA, Elyse Jarvis, Danae Orleans, Deeann Cree, Driver VR, Engelhard, Lookstein R, van den Tilman Neat, Jaff MR, Reinaldo Raddle, Henao S, AlMahameed A, Katzen B. Digital Subtraction Angiography Prior to an Amputation for Critical Limb Ischemia (CLI): An Expert Recommendation Statement From the CLI Global Society to Optimize Limb Salvage. J Endovasc Ther. 2020 Aug;27(4):540-546. doi: 10.1177/1526602820928590. Epub 2020 May 29. PMID: 13086578.    Electronically Signed: Gilmer Mor 12/22/2022, 1:56 PM   I spent a total of  60 Minutes   in face to face in clinical consultation, greater than 50% of which was counseling/coordinating care for left foot diabetic foot wound, possible angiogram, possible intervention.

## 2022-12-25 ENCOUNTER — Ambulatory Visit: Payer: 59 | Admitting: Orthopedic Surgery

## 2022-12-25 ENCOUNTER — Encounter (HOSPITAL_BASED_OUTPATIENT_CLINIC_OR_DEPARTMENT_OTHER): Payer: 59 | Attending: General Surgery | Admitting: Internal Medicine

## 2022-12-25 DIAGNOSIS — S91302A Unspecified open wound, left foot, initial encounter: Secondary | ICD-10-CM | POA: Insufficient documentation

## 2022-12-25 DIAGNOSIS — E10621 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: Secondary | ICD-10-CM | POA: Diagnosis present

## 2022-12-25 DIAGNOSIS — X58XXXA Exposure to other specified factors, initial encounter: Secondary | ICD-10-CM | POA: Insufficient documentation

## 2022-12-25 DIAGNOSIS — J449 Chronic obstructive pulmonary disease, unspecified: Secondary | ICD-10-CM | POA: Insufficient documentation

## 2022-12-25 DIAGNOSIS — L97522 Non-pressure chronic ulcer of other part of left foot with fat layer exposed: Secondary | ICD-10-CM | POA: Diagnosis not present

## 2022-12-25 DIAGNOSIS — L97528 Non-pressure chronic ulcer of other part of left foot with other specified severity: Secondary | ICD-10-CM

## 2022-12-25 DIAGNOSIS — M069 Rheumatoid arthritis, unspecified: Secondary | ICD-10-CM | POA: Diagnosis not present

## 2022-12-26 ENCOUNTER — Encounter: Payer: Self-pay | Admitting: Orthopedic Surgery

## 2022-12-26 NOTE — Progress Notes (Signed)
[name], [name] ([id number]) [phone].pdf Page 1 of 4 Visit Report for 12/25/2022 Abuse Risk Screen Details Patient Name: Date of Service: [name], [name] 12/25/2022 8:00 A M Medical Record Number: [id number] Patient Account Number: [id number] Date of Birth/Sex: Treating RN: [birth year] (57 y.o. [name] Primary Care Julia Kulzer: [name] Other Clinician: Referring Bonner Larue: Treating Tasman Zapata/Extender: Donnetta Hail in Treatment: 0 Abuse Risk Screen Items Answer ABUSE RISK SCREEN: Has anyone close to you tried to hurt or harm you recentlyo No Do you feel uncomfortable with anyone in your familyo No Has anyone forced you do things that you didnt want to doo No Electronic Signature(s) Signed: 12/25/2022 5:26:09 PM By: Redmond Pulling RN, BSN Entered By: Redmond Pulling on 12/25/2022 08:17:33 -------------------------------------------------------------------------------- Activities of Daily Living Details Patient Name: Date of Service: [name], [name] 12/25/2022 8:00 A M Medical Record Number: [id number] Patient Account Number: [id number] Date of Birth/Sex: Treating RN: 12/21/1965 (57 y.o. [name] Primary Care Darienne Belleau: [name] Other Clinician: Referring Marcell Chavarin: Treating Terry Bolotin/Extender: Donnetta Hail in Treatment: 0 Activities of Daily Living Items Answer Activities of Daily Living (Please select one for each item) Drive Automobile Completely Able T Medications ake Completely Able Use T elephone Completely Able Care for Appearance Completely Able Use T oilet Completely Able Bath / Shower Completely Able Dress Self Completely Able Feed Self Completely Able Walk Completely Able Get In / Out Bed Completely Able Housework Completely Able Prepare Meals Completely Able Handle Money Completely Able Shop for Self Completely Able Electronic  Signature(s) Signed: 12/25/2022 5:26:09 PM By: Redmond Pulling RN, BSN Entered By: Redmond Pulling on 12/25/2022 08:17:59 -------------------------------------------------------------------------------- Education Screening Details Patient Name: Date of Service: [name], [name]. 12/25/2022 8:00 A M Medical Record Number: [id number] Patient Account Number: [id number] Date of Birth/Sex: Treating RN: 11-11-1965 (57 y.o. [name] Primary Care Patricia Fargo: [name] Other Clinician: Referring Santana Edell: Treating Houa Ackert/Extender: Donnetta Hail in Treatment: 0 [name] ([id number]) [id number] Page 2 of 4 Primary Learner Assessed: Patient Learning Preferences/Education Level/Primary Language Learning Preference: Explanation, Demonstration, Printed Material Highest Education Level: College or Above Preferred Language: Economist Language Barrier: No Translator Needed: No Memory Deficit: No Emotional Barrier: No Cultural/Religious Beliefs Affecting Medical Care: No Physical Barrier Impaired Vision: No Impaired Hearing: No Decreased Hand dexterity: No Knowledge/Comprehension Knowledge Level: High Comprehension Level: High Ability to understand written instructions: High Ability to understand verbal instructions: High Motivation Anxiety Level: Calm Cooperation: Cooperative Education Importance: Acknowledges Need Interest in Health Problems: Asks Questions Perception: Coherent Willingness to Engage in Self-Management High Activities: Readiness to Engage in Self-Management High Activities: Electronic Signature(s) Signed: 12/25/2022 5:26:09 PM By: Redmond Pulling RN, BSN Entered By: Redmond Pulling on 12/25/2022 08:18:32 -------------------------------------------------------------------------------- Fall Risk Assessment Details Patient Name: Date of Service: [name] [name]. 12/25/2022  8:00 A M Medical Record Number: [id number] Patient Account Number: [id number] Date of Birth/Sex: Treating RN: October 28, 1965 (57 y.o. [name] Primary Care Yordy Matton: [name] Other Clinician: Referring Deren Degrazia: Treating Lilian Fuhs/Extender: Donnetta Hail in Treatment: 0 Fall Risk Assessment Items Have you had 2 or more falls in the last 12 monthso 0 No Have you had any fall that resulted in injury in the last 12 monthso 0 No FALLS RISK SCREEN History of falling - immediate or within 3 months 0 No Secondary diagnosis (Do you have 2 or more medical diagnoseso) 0 No Ambulatory aid None/bed rest/wheelchair/nurse 0 Yes Crutches/cane/walker 0  No Furniture 0 No Intravenous therapy Access/Saline/Heparin Lock 0 No Gait/Transferring Normal/ bed rest/ wheelchair 0 Yes Weak (short steps with or without shuffle, stooped but able to lift head while walking, may seek 0 No support from furniture) Impaired (short steps with shuffle, may have difficulty arising from chair, head down, impaired 0 No balance) Mental Status Oriented to own ability 0 Yes Overestimates or forgets limitations 0 No Risk Level: Low Risk Score: 0 [name], [name] ([id number]) [id number] Page 3 of 4 Electronic Signature(s) -------------------------------------------------------------------------------- Foot Assessment Details Patient Name: Date of Service: [name], [name] 12/25/2022 8:00 A M Medical Record Number: [id number] Patient Account Number: [id number] Date of Birth/Sex: Treating RN: 1965/10/10 (57 y.o. [name] Primary Care Mazzie Brodrick: [name] Other Clinician: Referring Haniah Penny: Treating Dilan Fullenwider/Extender: Donnetta Hail in Treatment: 0 Foot Assessment Items Site Locations + = Sensation present, - = Sensation absent, C = Callus, U = Ulcer R = Redness, W = Warmth, M = Maceration, PU =  Pre-ulcerative lesion F = Fissure, S = Swelling, D = Dryness Assessment Right: Left: Other Deformity: No No Prior Foot Ulcer: No No Prior Amputation: No No Charcot Joint: No No Ambulatory Status: Ambulatory Without Help Gait: Steady Electronic Signature(s) Signed: 12/25/2022 5:26:09 PM By: Redmond Pulling RN, BSN Entered By: Redmond Pulling on 12/25/2022 08:23:00 -------------------------------------------------------------------------------- Nutrition Risk Screening Details Patient Name: Date of Service: [name], [name]. 12/25/2022 8:00 A M Medical Record Number: [id number] Patient Account Number: [id number] Date of Birth/Sex: Treating RN: 28-Apr-1966 (57 y.o. [name] Primary Care Niomie Englert: [name] Other Clinician: Referring Heath Tesler: Treating Vianne Grieshop/Extender: Donnetta Hail in Treatment: 0 Height (in): 68 Weight (lbs): 200 Body Mass Index (BMI): 30.4 [name], [name] ([id number]) [id number] Page 4 of 4 Nutrition Risk Screening Items Score Screening NUTRITION RISK SCREEN: I have an illness or condition that made me change the kind and/or amount of food I eat 0 No I eat fewer than two meals per day 0 No I eat few fruits and vegetables, or milk products 0 No I have three or more drinks of beer, liquor or wine almost every day 0 No I have tooth or mouth problems that make it hard for me to eat 0 No I don't always have enough money to buy the food I need 0 No I eat alone most of the time 0 No I take three or more different prescribed or over-the-counter drugs a day 1 Yes Without wanting to, I have lost or gained 10 pounds in the last six months 0 No I am not always physically able to shop, cook and/or feed myself 0 No Nutrition Protocols Good Risk Protocol Moderate Risk Protocol High Risk Proctocol Risk Level: Good Risk Score: 1 Electronic Signature(s) Signed: 12/25/2022 5:26:09 PM By: Redmond Pulling RN, BSN Entered By: Redmond Pulling on 12/25/2022 08:19:48

## 2022-12-30 ENCOUNTER — Telehealth: Payer: Self-pay | Admitting: Orthopedic Surgery

## 2022-12-30 ENCOUNTER — Other Ambulatory Visit: Payer: Self-pay | Admitting: Orthopedic Surgery

## 2022-12-30 MED ORDER — SULFAMETHOXAZOLE-TRIMETHOPRIM 800-160 MG PO TABS: 1.0000 | ORAL_TABLET | Freq: Two times a day (BID) | ORAL | 0 refills | Status: DC

## 2022-12-30 NOTE — Telephone Encounter (Signed)
I called pt and advised abx at the pharm for pick up and made an appt for 01/06/2023 for follow up

## 2022-12-30 NOTE — Telephone Encounter (Signed)
Pt treated in office for achilles ulcer. Cx appt last week to be treated at the wound center and does not have follow up here in the office. Asking for a refill of Bactrim DS please advise.

## 2022-12-30 NOTE — Telephone Encounter (Signed)
Patient called. Would like a refill on sulfamethoxazole-trimethoprim (BACTRIM DS) 800-160 MG tablet  his cb# [phone]

## 2022-12-31 ENCOUNTER — Other Ambulatory Visit (HOSPITAL_COMMUNITY): Payer: Self-pay | Admitting: Internal Medicine

## 2022-12-31 ENCOUNTER — Ambulatory Visit (HOSPITAL_COMMUNITY)
Admission: RE | Admit: 2022-12-31 | Discharge: 2022-12-31 | Disposition: A | Discharge status: Home / Self Care | Payer: 59 | Source: Ambulatory Visit | Attending: Vascular Surgery | Admitting: Vascular Surgery

## 2022-12-31 DIAGNOSIS — I70223 Atherosclerosis of native arteries of extremities with rest pain, bilateral legs: Secondary | ICD-10-CM

## 2022-12-31 LAB — VAS US ABI WITH/WO TBI: Right ABI: 1.16

## 2023-01-01 ENCOUNTER — Ambulatory Visit (HOSPITAL_BASED_OUTPATIENT_CLINIC_OR_DEPARTMENT_OTHER): Payer: 59 | Admitting: General Surgery

## 2023-01-01 ENCOUNTER — Encounter (HOSPITAL_BASED_OUTPATIENT_CLINIC_OR_DEPARTMENT_OTHER): Payer: 59 | Admitting: Internal Medicine

## 2023-01-01 DIAGNOSIS — S91302A Unspecified open wound, left foot, initial encounter: Secondary | ICD-10-CM

## 2023-01-01 DIAGNOSIS — E10621 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: Secondary | ICD-10-CM

## 2023-01-01 DIAGNOSIS — J449 Chronic obstructive pulmonary disease, unspecified: Secondary | ICD-10-CM

## 2023-01-01 DIAGNOSIS — L97528 Non-pressure chronic ulcer of other part of left foot with other specified severity: Secondary | ICD-10-CM

## 2023-01-06 ENCOUNTER — Ambulatory Visit: Payer: 59 | Admitting: Orthopedic Surgery

## 2023-01-06 DIAGNOSIS — L97421 Non-pressure chronic ulcer of left heel and midfoot limited to breakdown of skin: Secondary | ICD-10-CM | POA: Diagnosis not present

## 2023-01-12 ENCOUNTER — Other Ambulatory Visit: Payer: Self-pay | Admitting: Orthopedic Surgery

## 2023-01-12 ENCOUNTER — Encounter (HOSPITAL_BASED_OUTPATIENT_CLINIC_OR_DEPARTMENT_OTHER): Payer: 59 | Attending: Internal Medicine | Admitting: Internal Medicine

## 2023-01-12 DIAGNOSIS — M8580 Other specified disorders of bone density and structure, unspecified site: Secondary | ICD-10-CM | POA: Insufficient documentation

## 2023-01-12 DIAGNOSIS — L97528 Non-pressure chronic ulcer of other part of left foot with other specified severity: Secondary | ICD-10-CM | POA: Diagnosis not present

## 2023-01-12 DIAGNOSIS — S91302A Unspecified open wound, left foot, initial encounter: Secondary | ICD-10-CM | POA: Diagnosis not present

## 2023-01-12 DIAGNOSIS — J449 Chronic obstructive pulmonary disease, unspecified: Secondary | ICD-10-CM | POA: Diagnosis not present

## 2023-01-12 DIAGNOSIS — M069 Rheumatoid arthritis, unspecified: Secondary | ICD-10-CM | POA: Diagnosis not present

## 2023-01-12 DIAGNOSIS — I2510 Atherosclerotic heart disease of native coronary artery without angina pectoris: Secondary | ICD-10-CM | POA: Insufficient documentation

## 2023-01-12 DIAGNOSIS — I129 Hypertensive chronic kidney disease with stage 1 through stage 4 chronic kidney disease, or unspecified chronic kidney disease: Secondary | ICD-10-CM | POA: Insufficient documentation

## 2023-01-12 DIAGNOSIS — Z794 Long term (current) use of insulin: Secondary | ICD-10-CM | POA: Insufficient documentation

## 2023-01-12 DIAGNOSIS — E10621 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: Secondary | ICD-10-CM | POA: Insufficient documentation

## 2023-01-12 DIAGNOSIS — N183 Chronic kidney disease, stage 3 unspecified: Secondary | ICD-10-CM | POA: Insufficient documentation

## 2023-01-12 DIAGNOSIS — E1022 Type 1 diabetes mellitus with diabetic chronic kidney disease: Secondary | ICD-10-CM | POA: Insufficient documentation

## 2023-01-13 ENCOUNTER — Encounter: Payer: Self-pay | Admitting: Vascular Surgery

## 2023-01-13 ENCOUNTER — Other Ambulatory Visit: Payer: Self-pay

## 2023-01-13 ENCOUNTER — Ambulatory Visit: Payer: 59 | Admitting: Orthopedic Surgery

## 2023-01-13 ENCOUNTER — Ambulatory Visit: Payer: 59 | Admitting: Vascular Surgery

## 2023-01-13 VITALS — BP 129/68 | HR 75 | Temp 97.9°F | Resp 16 | Ht 68.0 in | Wt 198.0 lb

## 2023-01-13 DIAGNOSIS — S81802A Unspecified open wound, left lower leg, initial encounter: Secondary | ICD-10-CM

## 2023-01-13 DIAGNOSIS — I739 Peripheral vascular disease, unspecified: Secondary | ICD-10-CM

## 2023-01-13 NOTE — Progress Notes (Signed)
Patient name: [name] MRN: [id number] DOB: 05-12-1966 Sex: male  REASON FOR CONSULT: Treatment of arterial blood flow and ischemic ulcer  HPI: [name] is a 57 y.o. male, with hx CAD, CKD, COPD, DM, HLD, tobacco abuse that presents for evaluation of left achilles wound.  He states this wound started about early May.  This has been nonhealing.  He denies any associated trauma but did have a new brace he was wearing.  Has been going to the wound clinic.  He did have ABIs on 12/31/2022 that were 1.16 on the right triphasic and noncompressible on the left triphasic.  He did have a marginal toe pressure of 58 on the left.  No previous vascular interventions.  Still smoking.  States he is getting retired in about 7 months and works for the [employer].  Past Medical History:  Diagnosis Date   Anginal pain (HCC)    Anxiety    Arthritis    RA IN HANDS   Asthma    CAD (coronary artery disease) 04/27/2014   Chronic renal disease, stage 3, moderately decreased glomerular filtration rate between 30-59 mL/min/1.73 square meter (HCC) 05/03/2014   COPD (chronic obstructive pulmonary disease) (HCC)    Coronary artery disease    Diabetes (HCC) 05/03/2014   Diabetes mellitus without complication (HCC)    type 1   Hyperlipidemia    Left shoulder pain    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/03/2014   Shortness of breath    Sleep apnea    mild OSA, no CPAP   Unstable angina pectoris (HCC) 04/25/2014    Past Surgical History:  Procedure Laterality Date   CARDIAC CATHETERIZATION  04/25/2014   BY DR Jacinto Halim   CARDIAC CATHETERIZATION N/A 10/30/2015   Procedure: Left Heart Cath and Cors/Grafts Angiography;  Surgeon: Yates Decamp, MD;  Location: [facility] INVASIVE CV LAB;  Service: Cardiovascular;  Laterality: N/A;   CARPAL TUNNEL RELEASE     CATARACT EXTRACTION, BILATERAL     CORONARY ARTERY BYPASS GRAFT N/A 04/27/2014   Procedure: CORONARY ARTERY BYPASS GRAFTING  on pump using left internal mammary artery to LAD coronary artery, right great saphenous vein graft to diagonal coronary artery with sequential to OM1 and circumflex coronary arteries. Right greater saphenous vein graft to posterior descending coronary artery. ;  Surgeon: Delight Ovens, MD;  Location: [facility] OR;  Service: Open Heart Surgery;  Laterality: N   elbow drained Left 05/09/2019   ENDOVEIN HARVEST OF GREATER SAPHENOUS VEIN Right 04/27/2014   Procedure: ENDOVEIN HARVEST OF GREATER SAPHENOUS VEIN;  Surgeon: Delight Ovens, MD;  Location: [facility] OR;  Service: Open Heart Surgery;  Laterality: Right;   EXCISION ORAL TUMOR N/A 03/01/2018   Procedure: EXCISION ORAL TUMOR;  Surgeon: Christia Reading, MD;  Location: [facility];  Service: ENT;  Laterality: N/A;   EYE SURGERY     LASER   EYE SURGERY Bilateral    astigmatism correction    HARDWARE REMOVAL Left 10/03/2020   Procedure: LEFT FOOT REMOVAL HARDWARE, PLACE VANC POWDER;  Surgeon: Nadara Mustard, MD;  Location: [facility] OR;  Service: Orthopedics;  Laterality: Left;   INTRAOPERATIVE TRANSESOPHAGEAL ECHOCARDIOGRAM N/A 04/27/2014   Procedure: INTRAOPERATIVE TRANSESOPHAGEAL ECHOCARDIOGRAM;  Surgeon: Delight Ovens, MD;  Location: [facility] OR;  Service: Open Heart Surgery;  Laterality: N/A;   IR RADIOLOGIST EVAL & MGMT  12/22/2022   LEFT HEART CATHETERIZATION WITH CORONARY ANGIOGRAM N/A 04/25/2014   Procedure: LEFT HEART CATHETERIZATION  WITH CORONARY ANGIOGRAM;  Surgeon: Pamella Pert, MD;  Location: [facility] CATH LAB;  Service: Cardiovascular;  Laterality: N/A;   MOUTH SURGERY  11/15/2017   tongue surgery    ORIF TOE FRACTURE Left 03/22/2020   Procedure: OPEN REDUCTION INTERNAL FIXATION (ORIF) Non Union 5th Metatarsal;  Surgeon: Kathryne Hitch, MD;  Location: [facility];  Service: Orthopedics;  Laterality: Left;    Family History  Problem Relation Age of Onset   Stomach cancer Mother    Cancer Mother    Prostate cancer  Father    Heart disease Father    Cancer - Prostate Father    Heart disease Brother    Asthma Daughter    Healthy Daughter    Asthma Daughter     SOCIAL HISTORY: Social History   Socioeconomic History   Marital status: Divorced    Spouse name: Not on file   Number of children: 3   Years of education: Not on file   Highest education level: Not on file  Occupational History   Not on file  Tobacco Use   Smoking status: Some Days    Packs/day: 0.25    Years: 34.00    Additional pack years: 0.00    Total pack years: 8.50    Types: Cigarettes   Smokeless tobacco: Never  Vaping Use   Vaping Use: Never used  Substance and Sexual Activity   Alcohol use: Yes    Alcohol/week: 0.0 standard drinks of alcohol    Comment: RARE   Drug use: No   Sexual activity: Not on file  Other Topics Concern   Not on file  Social History Narrative   Not on file   Social Determinants of Health   Financial Resource Strain: Not on file  Food Insecurity: Not on file  Transportation Needs: Not on file  Physical Activity: Not on file  Stress: Not on file  Social Connections: Not on file  Intimate Partner Violence: Not on file    Allergies  Allergen Reactions   Metoprolol Diarrhea   Niacin And Related     Other reaction(s): night sweats   Tramadol     Other reaction(s): SOB    Current Outpatient Medications  Medication Sig Dispense Refill   albuterol (VENTOLIN HFA) 108 (90 Base) MCG/ACT inhaler INHALE 2 PUFFS BY MOUTH EVERY 6 HOURS AS NEEDED FOR WHEEZING 9 g 0   Ascorbic Acid (VITAMIN C) 1000 MG tablet Take 1,000 mg by mouth daily.     bisoprolol (ZEBETA) 5 MG tablet TAKE ONE-HALF TABLET BY MOUTH  DAILY 45 tablet 3   Blood Glucose Monitoring Suppl (ONETOUCH VERIO FLEX SYSTEM) w/Device KIT USE TO CHECK BLOOD GLUCOSE UP TO 10 TIMES PER DAY     budesonide-formoterol (SYMBICORT) 80-4.5 MCG/ACT inhaler Inhale 2 puffs into the lungs in the morning and at bedtime. 1 each 5   buPROPion  (WELLBUTRIN SR) 150 MG 12 hr tablet TAKE 1 TABLET BY MOUTH DAILY 90 tablet 3   cetirizine (ZYRTEC) 10 MG tablet TAKE 1 TABLET BY MOUTH AT BEDTIME 30 tablet 2   Cholecalciferol (VITAMIN D3) 125 MCG (5000 UT) TABS Take 5,000 Units by mouth daily.     COD LIVER OIL PO Take 415 mg by mouth daily.     Continuous Blood Gluc Sensor (FREESTYLE LIBRE 14 DAY SENSOR) MISC CHANGE EVERY 14 DAYS TO MONITOR BLOOD GLUCOSE     gabapentin (NEURONTIN) 300 MG capsule Take 1 capsule (300 mg total) by mouth at bedtime.  30 capsule 2   HUMALOG 100 UNIT/ML injection Inject 15-20 Units into the skin 3 (three) times daily with meals. Sliding scale  Depending on carb intake     HUMIRA PEN 40 MG/0.4ML PNKT SMARTSIG:40 Milligram(s) SUB-Q Every 2 Weeks     Hydrocortisone, Perianal, 1 % CREA SMARTSIG:Rectally 3 Times Daily PRN     insulin glargine (LANTUS) 100 UNIT/ML injection Inject 14-17 Units into the skin See admin instructions. Inject into the skin 17 units in the morning and 14 unit at bedtime     ipratropium (ATROVENT) 0.06 % nasal spray Place into both nostrils.     Lidocaine 4 % PTCH Place 1 patch onto the skin at bedtime.     LINZESS 145 MCG CAPS capsule Take 145 mcg by mouth every morning.     losartan (COZAAR) 25 MG tablet Take 25 mg by mouth daily.     magnesium gluconate (MAGONATE) 500 MG tablet Take 500 mg by mouth daily.     nitroGLYCERIN (NITRODUR - DOSED IN MG/24 HR) 0.2 mg/hr patch Place 1 patch (0.2 mg total) onto the skin daily. 30 patch 12   nitroGLYCERIN (NITROSTAT) 0.4 MG SL tablet DISSOLVE 1 TABLET UNDER THE TONGUE EVERY 5 MINUTES AS  NEEDED FOR CHEST PAIN. MAX  OF 3 TABLETS IN 15 MINUTES. CALL 911 IF PAIN PERSISTS. (Patient taking differently: Place 0.4 mg under the tongue every 5 (five) minutes as needed for chest pain.) 100 tablet 3   oxymetazoline (AFRIN) 0.05 % nasal spray Place 2 sprays into both nostrils at bedtime.     pentoxifylline (TRENTAL) 400 MG CR tablet Take 1 tablet (400 mg total) by  mouth 3 (three) times daily with meals. 90 tablet 3   ranolazine (RANEXA) 500 MG 12 hr tablet Take 250 mg by mouth 2 (two) times daily.     simvastatin (ZOCOR) 20 MG tablet TAKE 1 TABLET BY MOUTH AT BEDTIME 90 tablet 1   sulfamethoxazole-trimethoprim (BACTRIM DS) 800-160 MG tablet Take 1 tablet by mouth 2 (two) times daily. 20 tablet 0   tadalafil (CIALIS) 20 MG tablet Take 20 mg by mouth daily as needed.     zinc gluconate 50 MG tablet Take 50 mg by mouth daily.     No current facility-administered medications for this visit.    REVIEW OF SYSTEMS:  [X]  denotes positive finding, [ ]  denotes negative finding Cardiac  Comments:  Chest pain or chest pressure:    Shortness of breath upon exertion:    Short of breath when lying flat:    Irregular heart rhythm:        Vascular    Pain in calf, thigh, or hip brought on by ambulation:    Pain in feet at night that wakes you up from your sleep:     Blood clot in your veins:    Leg swelling:         Pulmonary    Oxygen at home:    Productive cough:     Wheezing:         Neurologic    Sudden weakness in arms or legs:     Sudden numbness in arms or legs:     Sudden onset of difficulty speaking or slurred speech:    Temporary loss of vision in one eye:     Problems with dizziness:         Gastrointestinal    Blood in stool:     Vomited blood:  Genitourinary    Burning when urinating:     Blood in urine:        Psychiatric    Major depression:         Hematologic    Bleeding problems:    Problems with blood clotting too easily:        Skin    Rashes or ulcers:        Constitutional    Fever or chills:      PHYSICAL EXAM: Vitals:   01/13/23 1512  BP: 129/68  Pulse: 75  Resp: 16  Temp: 97.9 F (36.6 C)  TempSrc: Temporal  SpO2: 92%  Weight: 198 lb (89.8 kg)  Height: 5\' 8"  (1.727 m)    GENERAL: The patient is a well-nourished male, in no acute distress. The vital signs are documented above. CARDIAC:  There is a regular rate and rhythm.  VASCULAR:  Palpable femoral pulses bilaterally No palpable pedal pulses Left achilles wound pictured  PULMONARY: No respiratory distress ABDOMEN: Soft and non-tender. MUSCULOSKELETAL: There are no major deformities or cyanosis. NEUROLOGIC: No focal weakness or paresthesias are detected. PSYCHIATRIC: The patient has a normal affect.    DATA:   ABIs on 12/31/2022 that were 1.16 on the right triphasic and noncompressible on the left triphasic.  He did have a marginal toe pressure of 58 on the left.  Assessment/Plan:  57 y.o. male, with hx CAD, CKD, COPD, DM, HLD, tobacco abuse that presents for evaluation of left achilles wound.  States this wound started about early May.  I cannot palpate any pedal pulses.  He has a marginal toe pressure in the 50s with non-compressible ABI's.  He has multiple risk factors including diabetes and tobacco abuse.  I have subsequently recommended lower extremity arteriogram with a focus on the left leg with possible intervention.  I suspect this will likely be tibial disease.  I will schedule for Thursday in the Cath Lab.  Risk benefits discussed  including risk of transfemoral access with bleeding and vessel injury.   Cephus Shelling, MD [facility] Office: [phone]

## 2023-01-15 ENCOUNTER — Other Ambulatory Visit: Payer: Self-pay

## 2023-01-15 ENCOUNTER — Encounter (HOSPITAL_COMMUNITY)
Admission: RE | Disposition: A | Discharge status: Home / Self Care | Payer: Self-pay | Source: Home / Self Care | Attending: Vascular Surgery

## 2023-01-15 ENCOUNTER — Ambulatory Visit (HOSPITAL_COMMUNITY)
Admission: RE | Admit: 2023-01-15 | Discharge: 2023-01-15 | Disposition: A | Discharge status: Home / Self Care | Payer: 59 | Attending: Vascular Surgery | Admitting: Vascular Surgery

## 2023-01-15 ENCOUNTER — Ambulatory Visit (HOSPITAL_BASED_OUTPATIENT_CLINIC_OR_DEPARTMENT_OTHER): Payer: 59 | Admitting: Internal Medicine

## 2023-01-15 DIAGNOSIS — N183 Chronic kidney disease, stage 3 unspecified: Secondary | ICD-10-CM | POA: Insufficient documentation

## 2023-01-15 DIAGNOSIS — I70244 Atherosclerosis of native arteries of left leg with ulceration of heel and midfoot: Secondary | ICD-10-CM | POA: Diagnosis not present

## 2023-01-15 DIAGNOSIS — E1022 Type 1 diabetes mellitus with diabetic chronic kidney disease: Secondary | ICD-10-CM | POA: Insufficient documentation

## 2023-01-15 DIAGNOSIS — F17210 Nicotine dependence, cigarettes, uncomplicated: Secondary | ICD-10-CM | POA: Insufficient documentation

## 2023-01-15 DIAGNOSIS — L97429 Non-pressure chronic ulcer of left heel and midfoot with unspecified severity: Secondary | ICD-10-CM | POA: Diagnosis not present

## 2023-01-15 DIAGNOSIS — E10621 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: Secondary | ICD-10-CM | POA: Diagnosis present

## 2023-01-15 DIAGNOSIS — I2510 Atherosclerotic heart disease of native coronary artery without angina pectoris: Secondary | ICD-10-CM | POA: Insufficient documentation

## 2023-01-15 DIAGNOSIS — Z794 Long term (current) use of insulin: Secondary | ICD-10-CM | POA: Diagnosis not present

## 2023-01-15 DIAGNOSIS — E785 Hyperlipidemia, unspecified: Secondary | ICD-10-CM | POA: Diagnosis not present

## 2023-01-15 DIAGNOSIS — S81802A Unspecified open wound, left lower leg, initial encounter: Secondary | ICD-10-CM

## 2023-01-15 DIAGNOSIS — J449 Chronic obstructive pulmonary disease, unspecified: Secondary | ICD-10-CM | POA: Diagnosis not present

## 2023-01-15 HISTORY — PX: ABDOMINAL AORTOGRAM W/LOWER EXTREMITY: CATH118223

## 2023-01-15 LAB — POCT I-STAT, CHEM 8
BUN: 26 mg/dL — ABNORMAL HIGH (ref 6–20)
Calcium, Ion: 1.16 mmol/L (ref 1.15–1.40)
Chloride: 104 mmol/L (ref 98–111)
Creatinine, Ser: 1.4 mg/dL — ABNORMAL HIGH (ref 0.61–1.24)
Glucose, Bld: 129 mg/dL — ABNORMAL HIGH (ref 70–99)
HCT: 38 % — ABNORMAL LOW (ref 39.0–52.0)
Hemoglobin: 12.9 g/dL — ABNORMAL LOW (ref 13.0–17.0)
Potassium: 4.4 mmol/L (ref 3.5–5.1)
Sodium: 139 mmol/L (ref 135–145)
TCO2: 26 mmol/L (ref 22–32)

## 2023-01-15 LAB — GLUCOSE, CAPILLARY: Glucose-Capillary: 98 mg/dL (ref 70–99)

## 2023-01-15 SURGERY — ABDOMINAL AORTOGRAM W/LOWER EXTREMITY
Anesthesia: LOCAL

## 2023-01-15 MED ORDER — SODIUM CHLORIDE 0.9 % IV SOLN: INTRAVENOUS | Status: DC

## 2023-01-15 MED ORDER — MIDAZOLAM HCL 2 MG/2ML IJ SOLN
INTRAMUSCULAR | Status: AC
  Filled 2023-01-15: qty 2

## 2023-01-15 MED ORDER — ONDANSETRON HCL 4 MG/2ML IJ SOLN: 4.0000 mg | Freq: Four times a day (QID) | INTRAMUSCULAR | Status: DC | PRN

## 2023-01-15 MED ORDER — SODIUM CHLORIDE 0.9% FLUSH: 3.0000 mL | INTRAVENOUS | Status: DC | PRN

## 2023-01-15 MED ORDER — SODIUM CHLORIDE 0.9 % IV SOLN: 250.0000 mL | INTRAVENOUS | Status: DC | PRN

## 2023-01-15 MED ORDER — SODIUM CHLORIDE 0.9% FLUSH: 3.0000 mL | Freq: Two times a day (BID) | INTRAVENOUS | Status: DC

## 2023-01-15 MED ORDER — HEPARIN (PORCINE) IN NACL 1000-0.9 UT/500ML-% IV SOLN
INTRAVENOUS | Status: DC | PRN
  Administered 2023-01-15 (×2): 500 mL

## 2023-01-15 MED ORDER — FENTANYL CITRATE (PF) 100 MCG/2ML IJ SOLN
INTRAMUSCULAR | Status: DC | PRN
  Administered 2023-01-15 (×2): 25 ug via INTRAVENOUS

## 2023-01-15 MED ORDER — ACETAMINOPHEN 325 MG PO TABS: 650.0000 mg | ORAL_TABLET | ORAL | Status: DC | PRN

## 2023-01-15 MED ORDER — ASPIRIN 81 MG PO TBEC: 81.0000 mg | DELAYED_RELEASE_TABLET | Freq: Every day | ORAL | 2 refills | Status: AC

## 2023-01-15 MED ORDER — IODIXANOL 320 MG/ML IV SOLN
INTRAVENOUS | Status: DC | PRN
  Administered 2023-01-15: 70 mL

## 2023-01-15 MED ORDER — LIDOCAINE HCL (PF) 1 % IJ SOLN
INTRAMUSCULAR | Status: AC
  Filled 2023-01-15: qty 30

## 2023-01-15 MED ORDER — LIDOCAINE HCL (PF) 1 % IJ SOLN
INTRAMUSCULAR | Status: DC | PRN
  Administered 2023-01-15: 15 mL

## 2023-01-15 MED ORDER — FENTANYL CITRATE (PF) 100 MCG/2ML IJ SOLN
INTRAMUSCULAR | Status: AC
  Filled 2023-01-15: qty 2

## 2023-01-15 MED ORDER — HYDRALAZINE HCL 20 MG/ML IJ SOLN: 5.0000 mg | INTRAMUSCULAR | Status: DC | PRN

## 2023-01-15 MED ORDER — ASPIRIN 81 MG PO TBEC: 81.0000 mg | DELAYED_RELEASE_TABLET | Freq: Every day | ORAL | Status: DC

## 2023-01-15 MED ORDER — MIDAZOLAM HCL 2 MG/2ML IJ SOLN
INTRAMUSCULAR | Status: DC | PRN
  Administered 2023-01-15 (×2): 1 mg via INTRAVENOUS

## 2023-01-15 SURGICAL SUPPLY — 13 items
CATH OMNI FLUSH 5F 65CM (CATHETERS) IMPLANT
DEVICE CLOSURE MYNXGRIP 5F (Vascular Products) IMPLANT
GLIDEWIRE ADV .035X260CM (WIRE) IMPLANT
KIT MICROPUNCTURE NIT STIFF (SHEATH) IMPLANT
KIT PV (KITS) ×1 IMPLANT
SHEATH PINNACLE 5F 10CM (SHEATH) IMPLANT
SHEATH PROBE COVER 6X72 (BAG) IMPLANT
STOPCOCK MORSE 400PSI 3WAY (MISCELLANEOUS) IMPLANT
SYR MEDRAD MARK 7 150ML (SYRINGE) ×1 IMPLANT
TRANSDUCER W/STOPCOCK (MISCELLANEOUS) ×1 IMPLANT
TRAY PV CATH (CUSTOM PROCEDURE TRAY) ×1 IMPLANT
TUBING CIL FLEX 10 FLL-RA (TUBING) IMPLANT
WIRE BENTSON .035X145CM (WIRE) IMPLANT

## 2023-01-15 NOTE — H&P (Signed)
History and Physical Interval Note:  01/15/2023 2:11 PM  [name]  has presented today for surgery, with the diagnosis of left achilles wound.  The various methods of treatment have been discussed with the patient and family. After consideration of risks, benefits and other options for treatment, the patient has consented to  Procedure(s): ABDOMINAL AORTOGRAM W/LOWER EXTREMITY (N/A) as a surgical intervention.  The patient's history has been reviewed, patient examined, no change in status, stable for surgery.  I have reviewed the patient's chart and labs.  Questions were answered to the patient's satisfaction.     [name]     Patient name: [name]         MRN: [id number]        DOB: 11/11/1965          Sex: male   REASON FOR CONSULT: Treatment of arterial blood flow and ischemic ulcer   HPI: [name] is a 57 y.o. male, with hx CAD, CKD, COPD, DM, HLD, tobacco abuse that presents for evaluation of left achilles wound.  He states this wound started about early May.  This has been nonhealing.  He denies any associated trauma but did have a new brace he was wearing.  Has been going to the wound clinic.  He did have ABIs on 12/31/2022 that were 1.16 on the right triphasic and noncompressible on the left triphasic.  He did have a marginal toe pressure of 58 on the left.  No previous vascular interventions.  Still smoking.  States he is getting retired in about 7 months and works for the [employer].       Past Medical History:  Diagnosis Date   Anginal pain (HCC)     Anxiety     Arthritis      RA IN HANDS   Asthma     CAD (coronary artery disease) 04/27/2014   Chronic renal disease, stage 3, moderately decreased glomerular filtration rate between 30-59 mL/min/1.73 square meter (HCC) 05/03/2014   COPD (chronic obstructive pulmonary disease) (HCC)     Coronary artery disease     Diabetes (HCC) 05/03/2014   Diabetes mellitus without  complication (HCC)      type 1   Hyperlipidemia     Left shoulder pain     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/03/2014   Shortness of breath     Sleep apnea      mild OSA, no CPAP   Unstable angina pectoris (HCC) 04/25/2014           Past Surgical History:  Procedure Laterality Date   CARDIAC CATHETERIZATION   04/25/2014    BY DR Jacinto Halim   CARDIAC CATHETERIZATION N/A 10/30/2015    Procedure: Left Heart Cath and Cors/Grafts Angiography;  Surgeon: Yates Decamp, MD;  Location: [facility] INVASIVE CV LAB;  Service: Cardiovascular;  Laterality: N/A;   CARPAL TUNNEL RELEASE       CATARACT EXTRACTION, BILATERAL       CORONARY ARTERY BYPASS GRAFT N/A 04/27/2014    Procedure: CORONARY ARTERY BYPASS GRAFTING on pump using left internal mammary artery to LAD coronary artery, right great saphenous vein graft to diagonal coronary artery with sequential to OM1 and circumflex coronary arteries. Right greater saphenous vein graft to posterior descending coronary artery. ;  Surgeon: Delight Ovens, MD;  Location: [facility] OR;  Service: Open Heart Surgery;  Laterality: N   elbow drained Left 05/09/2019   ENDOVEIN HARVEST OF GREATER SAPHENOUS  VEIN Right 04/27/2014    Procedure: ENDOVEIN HARVEST OF GREATER SAPHENOUS VEIN;  Surgeon: Delight Ovens, MD;  Location: [facility] OR;  Service: Open Heart Surgery;  Laterality: Right;   EXCISION ORAL TUMOR N/A 03/01/2018    Procedure: EXCISION ORAL TUMOR;  Surgeon: Christia Reading, MD;  Location: [facility];  Service: ENT;  Laterality: N/A;   EYE SURGERY        LASER   EYE SURGERY Bilateral      astigmatism correction    HARDWARE REMOVAL Left 10/03/2020    Procedure: LEFT FOOT REMOVAL HARDWARE, PLACE VANC POWDER;  Surgeon: Nadara Mustard, MD;  Location: [facility] OR;  Service: Orthopedics;  Laterality: Left;   INTRAOPERATIVE TRANSESOPHAGEAL ECHOCARDIOGRAM N/A 04/27/2014    Procedure: INTRAOPERATIVE TRANSESOPHAGEAL ECHOCARDIOGRAM;  Surgeon: Delight Ovens, MD;   Location: [facility] OR;  Service: Open Heart Surgery;  Laterality: N/A;   IR RADIOLOGIST EVAL & MGMT   12/22/2022   LEFT HEART CATHETERIZATION WITH CORONARY ANGIOGRAM N/A 04/25/2014    Procedure: LEFT HEART CATHETERIZATION WITH CORONARY ANGIOGRAM;  Surgeon: Pamella Pert, MD;  Location: [facility] CATH LAB;  Service: Cardiovascular;  Laterality: N/A;   MOUTH SURGERY   11/15/2017    tongue surgery    ORIF TOE FRACTURE Left 03/22/2020    Procedure: OPEN REDUCTION INTERNAL FIXATION (ORIF) Non Union 5th Metatarsal;  Surgeon: Kathryne Hitch, MD;  Location: [facility];  Service: Orthopedics;  Laterality: Left;           Family History  Problem Relation Age of Onset   Stomach cancer Mother     Cancer Mother     Prostate cancer Father     Heart disease Father     Cancer - Prostate Father     Heart disease Brother     Asthma Daughter     Healthy Daughter     Asthma Daughter        SOCIAL HISTORY: Social History         Socioeconomic History   Marital status: Divorced      Spouse name: Not on file   Number of children: 3   Years of education: Not on file   Highest education level: Not on file  Occupational History   Not on file  Tobacco Use   Smoking status: Some Days      Packs/day: 0.25      Years: 34.00      Additional pack years: 0.00      Total pack years: 8.50      Types: Cigarettes   Smokeless tobacco: Never  Vaping Use   Vaping Use: Never used  Substance and Sexual Activity   Alcohol use: Yes      Alcohol/week: 0.0 standard drinks of alcohol      Comment: RARE   Drug use: No   Sexual activity: Not on file  Other Topics Concern   Not on file  Social History Narrative   Not on file    Social Determinants of Health    Financial Resource Strain: Not on file  Food Insecurity: Not on file  Transportation Needs: Not on file  Physical Activity: Not on file  Stress: Not on file  Social Connections: Not on file  Intimate Partner Violence: Not on file            Allergies  Allergen Reactions   Metoprolol Diarrhea   Niacin And Related        Other reaction(s): night sweats  Tramadol        Other reaction(s): SOB            Current Outpatient Medications  Medication Sig Dispense Refill   albuterol (VENTOLIN HFA) 108 (90 Base) MCG/ACT inhaler INHALE 2 PUFFS BY MOUTH EVERY 6 HOURS AS NEEDED FOR WHEEZING 9 g 0   Ascorbic Acid (VITAMIN C) 1000 MG tablet Take 1,000 mg by mouth daily.       bisoprolol (ZEBETA) 5 MG tablet TAKE ONE-HALF TABLET BY MOUTH  DAILY 45 tablet 3   Blood Glucose Monitoring Suppl (ONETOUCH VERIO FLEX SYSTEM) w/Device KIT USE TO CHECK BLOOD GLUCOSE UP TO 10 TIMES PER DAY       budesonide-formoterol (SYMBICORT) 80-4.5 MCG/ACT inhaler Inhale 2 puffs into the lungs in the morning and at bedtime. 1 each 5   buPROPion (WELLBUTRIN SR) 150 MG 12 hr tablet TAKE 1 TABLET BY MOUTH DAILY 90 tablet 3   cetirizine (ZYRTEC) 10 MG tablet TAKE 1 TABLET BY MOUTH AT BEDTIME 30 tablet 2   Cholecalciferol (VITAMIN D3) 125 MCG (5000 UT) TABS Take 5,000 Units by mouth daily.       COD LIVER OIL PO Take 415 mg by mouth daily.       Continuous Blood Gluc Sensor (FREESTYLE LIBRE 14 DAY SENSOR) MISC CHANGE EVERY 14 DAYS TO MONITOR BLOOD GLUCOSE       gabapentin (NEURONTIN) 300 MG capsule Take 1 capsule (300 mg total) by mouth at bedtime. 30 capsule 2   HUMALOG 100 UNIT/ML injection Inject 15-20 Units into the skin 3 (three) times daily with meals. Sliding scale  Depending on carb intake       HUMIRA PEN 40 MG/0.4ML PNKT SMARTSIG:40 Milligram(s) SUB-Q Every 2 Weeks       Hydrocortisone, Perianal, 1 % CREA SMARTSIG:Rectally 3 Times Daily PRN       insulin glargine (LANTUS) 100 UNIT/ML injection Inject 14-17 Units into the skin See admin instructions. Inject into the skin 17 units in the morning and 14 unit at bedtime       ipratropium (ATROVENT) 0.06 % nasal spray Place into both nostrils.       Lidocaine 4 % PTCH Place 1 patch onto the skin at  bedtime.       LINZESS 145 MCG CAPS capsule Take 145 mcg by mouth every morning.       losartan (COZAAR) 25 MG tablet Take 25 mg by mouth daily.       magnesium gluconate (MAGONATE) 500 MG tablet Take 500 mg by mouth daily.       nitroGLYCERIN (NITRODUR - DOSED IN MG/24 HR) 0.2 mg/hr patch Place 1 patch (0.2 mg total) onto the skin daily. 30 patch 12   nitroGLYCERIN (NITROSTAT) 0.4 MG SL tablet DISSOLVE 1 TABLET UNDER THE TONGUE EVERY 5 MINUTES AS  NEEDED FOR CHEST PAIN. MAX  OF 3 TABLETS IN 15 MINUTES. CALL 911 IF PAIN PERSISTS. (Patient taking differently: Place 0.4 mg under the tongue every 5 (five) minutes as needed for chest pain.) 100 tablet 3   oxymetazoline (AFRIN) 0.05 % nasal spray Place 2 sprays into both nostrils at bedtime.       pentoxifylline (TRENTAL) 400 MG CR tablet Take 1 tablet (400 mg total) by mouth 3 (three) times daily with meals. 90 tablet 3   ranolazine (RANEXA) 500 MG 12 hr tablet Take 250 mg by mouth 2 (two) times daily.       simvastatin (ZOCOR) 20 MG tablet TAKE 1 TABLET  BY MOUTH AT BEDTIME 90 tablet 1   sulfamethoxazole-trimethoprim (BACTRIM DS) 800-160 MG tablet Take 1 tablet by mouth 2 (two) times daily. 20 tablet 0   tadalafil (CIALIS) 20 MG tablet Take 20 mg by mouth daily as needed.       zinc gluconate 50 MG tablet Take 50 mg by mouth daily.        No current facility-administered medications for this visit.      REVIEW OF SYSTEMS:  [X]  denotes positive finding, [ ]  denotes negative finding Cardiac   Comments:  Chest pain or chest pressure:      Shortness of breath upon exertion:      Short of breath when lying flat:      Irregular heart rhythm:             Vascular      Pain in calf, thigh, or hip brought on by ambulation:      Pain in feet at night that wakes you up from your sleep:       Blood clot in your veins:      Leg swelling:              Pulmonary      Oxygen at home:      Productive cough:       Wheezing:              Neurologic       Sudden weakness in arms or legs:       Sudden numbness in arms or legs:       Sudden onset of difficulty speaking or slurred speech:      Temporary loss of vision in one eye:       Problems with dizziness:              Gastrointestinal      Blood in stool:       Vomited blood:              Genitourinary      Burning when urinating:       Blood in urine:             Psychiatric      Major depression:              Hematologic      Bleeding problems:      Problems with blood clotting too easily:             Skin      Rashes or ulcers:             Constitutional      Fever or chills:          PHYSICAL EXAM:    Vitals:    01/13/23 1512  BP: 129/68  Pulse: 75  Resp: 16  Temp: 97.9 F (36.6 C)  TempSrc: Temporal  SpO2: 92%  Weight: 198 lb (89.8 kg)  Height: 5\' 8"  (1.727 m)      GENERAL: The patient is a well-nourished male, in no acute distress. The vital signs are documented above. CARDIAC: There is a regular rate and rhythm.  VASCULAR:  Palpable femoral pulses bilaterally No palpable pedal pulses Left achilles wound pictured  PULMONARY: No respiratory distress ABDOMEN: Soft and non-tender. MUSCULOSKELETAL: There are no major deformities or cyanosis. NEUROLOGIC: No focal weakness or paresthesias are detected. PSYCHIATRIC: The patient has a normal affect.      DATA:    ABIs on 12/31/2022 that  were 1.16 on the right triphasic and noncompressible on the left triphasic.  He did have a marginal toe pressure of 58 on the left.   Assessment/Plan:   57 y.o. male, with hx CAD, CKD, COPD, DM, HLD, tobacco abuse that presents for evaluation of left achilles wound.  States this wound started about early May.  I cannot palpate any pedal pulses.  He has a marginal toe pressure in the 50s with non-compressible ABI's.  He has multiple risk factors including diabetes and tobacco abuse.  I have subsequently recommended lower extremity arteriogram with a focus on the left leg  with possible intervention.  I suspect this will likely be tibial disease.  I will schedule for Thursday in the Cath Lab.  Risk benefits discussed  including risk of transfemoral access with bleeding and vessel injury.     [name] Shelling, MD [facility] Office: [phone]

## 2023-01-15 NOTE — Op Note (Signed)
    Patient name: [name] MRN: [id number] DOB: Dec 13, 1965 Sex: male  01/15/2023 Pre-operative Diagnosis: Left achilles wound Post-operative diagnosis:  Same Surgeon:  Cephus Shelling, MD Procedure Performed: 1.  Ultrasound-guided access right common femoral artery 2.  Aortogram with catheter selection of aorta 3.  Left lower extremity arteriogram with catheter selection of left common femoral artery 4.  Mynx closure of the right common femoral artery 5.  21 minutes of monitored moderate conscious sedation time  Contrast: 70 mL  Indications: Patient is a 57 year old male with multiple risk factors that developed a left achilles wound.  He presents today for angiogram with a focus on the left leg and possible intervention after risks benefits discussed.  Findings:   Aortogram showed patent renal arteries bilaterally.  The infrarenal aorta was patent.  Both iliac arteries were patent without flow-limiting stenosis.  He does have a steep aortic bifurcation.  Left lower extremity arteriogram showed widely patent common femoral and profunda.  There is some mild disease in the proximal SFA as well as the above-knee popliteal artery but not flow-limiting.  His SFA is otherwise patent.  Patent above and below-knee popliteal artery.  He has two-vessel runoff in the anterior tibial and peroneal artery.  The posterior tibial is occluded throughout its course and does not reconstitute.  He fills a plantar arch with very brisk flow into the foot.  There is some disease in the distal anterior tibial that did not appear flow-limiting.  No intervention necessary.   Procedure:  The patient was identified in the holding area and taken to room 8.  The patient was then placed supine on the table and prepped and draped in the usual sterile fashion.  A time out was called.  The patient received Versed and fentanyl for conscious moderate sedation.  I was present for all of moderate sedation.  Vital signs  were monitored including heart rate, respiratory rate, oxygenation and blood pressure.  Ultrasound was used to evaluate the right common femoral artery.  It was patent .  A digital ultrasound image was acquired.  A micropuncture needle was used to access the right common femoral artery under ultrasound guidance.  An 018 wire was advanced without resistance and a micropuncture sheath was placed.  The 018 wire was removed and a benson wire was placed.  The micropuncture sheath was exchanged for a 5 french sheath.  An omniflush catheter was advanced over the wire to the level of L-1.  An abdominal angiogram was obtained.  Next, using the omniflush catheter and a glidewire advantage, the aortic bifurcation was crossed and the catheter was placed into theleft external iliac artery and left runoff was obtained.  Pertinent findings noted above.  No intervention was performed given inline flow without any focal flow-limiting high-grade stenosis.  He has two-vessel tibial runoff.  Wires and catheters were removed.  A mynx closure supported in the right groin.  Plan: No intervention performed.  He has inline flow in the left lower extremity with two-vessel runoff in the anterior tibial and peroneal.  Optimized for wound healing.  Should be more than adequate inflow.  Needs optimal medical therapy and smoking cessation.     Cephus Shelling, MD [facility] Office: [phone]

## 2023-01-16 ENCOUNTER — Encounter (HOSPITAL_COMMUNITY): Payer: Self-pay | Admitting: Vascular Surgery

## 2023-01-16 NOTE — Progress Notes (Signed)
Office Visit Note   Patient: [name]           Date of Birth: 13-Apr-1966           MRN: [id number] Visit Date: 01/06/2023              Requested by: Garlan Fillers, MD [address] [place],  [place] [address] PCP: Garlan Fillers, MD  Chief Complaint  Patient presents with   Left Achilles Tendon - Follow-up      HPI: Patient is a 57 year old gentleman who has developed a ulcer over the Achilles he is currently on Bactrim DS.  He stopped his nitroglycerin patch stating this caused the skin to break out.  Patient states he was evaluated at the wound center and was advised to use Vaseline dressing.  Patient has had a vascular evaluation with Dr. Chestine Spore.  Patient states he requested hyperbaric treatment but he states this was denied.  He is currently on [place].  Assessment & Plan: Visit Diagnoses:  1. Heel ulcer, left, limited to breakdown of skin (HCC)     Plan: Change dressing in 3 days.  Discussed the importance of smoking cessation.  Patient states he has decreased his smoking.  Donated Kerecis tissue graft was applied.  Follow-Up Instructions: Return in about 1 week (around 01/13/2023).   Ortho Exam  Patient is alert, oriented, no adenopathy, well-dressed, normal affect, normal respiratory effort. Examination there is fibrinous tissue over the Achilles ulcer.  After informed consent a 10 blade knife was used to debride the skin and soft tissue.  This left a wound that was 9 mm in diameter.  Donated Kerecis tissue graft was applied.  Imaging: No results found. No images are attached to the encounter.  Labs: Lab Results  Component Value Date   HGBA1C 7.4 (H) 04/25/2014   HGBA1C 7.4 (H) 04/25/2014   ESRSEDRATE 9 09/26/2019   ESRSEDRATE 12 09/09/2017   ESRSEDRATE 58 (H) 06/01/2014   REPTSTATUS 10/08/2020 FINAL 10/03/2020   GRAMSTAIN  10/03/2020    FEW WBC PRESENT, PREDOMINANTLY MONONUCLEAR NO ORGANISMS SEEN    CULT  10/03/2020    NO ANAEROBES  ISOLATED Performed at [facility], [address] [place], [place] [address]      Lab Results  Component Value Date   ALBUMIN 3.7 03/15/2018   ALBUMIN 3.7 09/09/2017   ALBUMIN 4.2 03/30/2017    Lab Results  Component Value Date   MG 2.3 04/28/2014   MG 2.4 04/28/2014   MG 2.7 (H) 04/27/2014   Lab Results  Component Value Date   VD25OH 57 06/22/2019    No results found for: "PREALBUMIN"    Latest Ref Rng & Units 01/15/2023    1:35 PM 04/01/2021    3:37 PM 10/03/2020    6:45 AM  CBC EXTENDED  WBC 4.0 - 10.5 K/uL   12.9   RBC 4.22 - 5.81 MIL/uL   3.79   Hemoglobin 13.0 - 17.0 g/dL 16.6  06.3  01.6   HCT 39.0 - 52.0 % 38.0  33.9  36.6   Platelets 150 - 400 K/uL   275      There is no height or weight on file to calculate BMI.  Orders:  No orders of the defined types were placed in this encounter.  No orders of the defined types were placed in this encounter.    Procedures: No procedures performed  Clinical Data: No additional findings.  ROS:  All other systems  negative, except as noted in the HPI. Review of Systems  Objective: Vital Signs: There were no vitals taken for this visit.  Specialty Comments:  No specialty comments available.  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 03/22/2020   Osteopenia of multiple sites 02/28/2020   PVD (peripheral vascular disease) (HCC) 10/11/2019   Chronic venous insufficiency 10/05/2018   Diabetic cataract (HCC) 05/21/2018   Controlled type 1 diabetes mellitus with diabetic peripheral angiopathy without gangrene (HCC) 08/14/2017   Dyslipidemia 03/26/2017   High risk medication use 09/23/2016   History of hypertension 09/23/2016   History of chronic kidney disease 09/23/2016   History of coronary artery disease 09/23/2016   Tobacco abuse  09/23/2016   Primary osteoarthritis of both knees 09/23/2016   History of diabetes mellitus 09/23/2016   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 (HCC) 11/20/2015   Chest pain with high risk for cardiac etiology 10/29/2015   Asymptomatic bilateral carotid artery stenosis 06/08/2014   Pleural effusion, bilateral 06/03/2014   Dyspnea 06/01/2014   Diabetes (HCC) 05/03/2014   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/03/2014   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   Chronic renal disease, stage 3, moderately decreased glomerular filtration rate between 30-59 mL/min/1.73 square meter (HCC) 05/03/2014   CAD (coronary artery disease) 04/27/2014   Acne 12/19/2013   On isotretinoin therapy 12/19/2013   Nuclear sclerotic cataract, bilateral 12/19/2011   Vitreous hemorrhage (HCC) 06/16/2011   Past Medical History:  Diagnosis Date   Anginal pain (HCC)    Anxiety    Arthritis    RA IN HANDS   Asthma    CAD (coronary artery disease) 04/27/2014   Chronic renal disease, stage 3, moderately decreased glomerular filtration rate between 30-59 mL/min/1.73 square meter (HCC) 05/03/2014   COPD (chronic obstructive pulmonary disease) (HCC)    Coronary artery disease    Diabetes (HCC) 05/03/2014   Diabetes mellitus without complication (HCC)    type 1   Hyperlipidemia    Left shoulder pain    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/03/2014   Shortness of breath    Sleep apnea    mild OSA, no CPAP   Unstable angina pectoris (HCC) 04/25/2014    Family History  Problem Relation Age of Onset   Stomach cancer Mother    Cancer Mother    Prostate cancer Father    Heart disease Father    Cancer - Prostate Father    Heart disease Brother    Asthma Daughter    Healthy Daughter    Asthma Daughter     Past Surgical History:  Procedure Laterality Date   ABDOMINAL AORTOGRAM W/LOWER EXTREMITY  N/A 01/15/2023   Procedure: ABDOMINAL AORTOGRAM W/LOWER EXTREMITY;  Surgeon: Cephus Shelling, MD;  Location: [facility] INVASIVE CV LAB;  Service: Cardiovascular;  Laterality: N/A;   CARDIAC CATHETERIZATION  04/25/2014   BY DR Jacinto Halim   CARDIAC CATHETERIZATION N/A 10/30/2015   Procedure: Left Heart Cath and Cors/Grafts Angiography;  Surgeon: Yates Decamp, MD;  Location: [facility] INVASIVE CV LAB;  Service: Cardiovascular;  Laterality: N/A;   CARPAL TUNNEL RELEASE     CATARACT EXTRACTION, BILATERAL     CORONARY ARTERY BYPASS GRAFT N/A 04/27/2014   Procedure: CORONARY ARTERY BYPASS GRAFTING on pump using left internal mammary  artery to LAD coronary artery, right great saphenous vein graft to diagonal coronary artery with sequential to OM1 and circumflex coronary arteries. Right greater saphenous vein graft to posterior descending coronary artery. ;  Surgeon: Delight Ovens, MD;  Location: [facility] OR;  Service: Open Heart Surgery;  Laterality: N   elbow drained Left 05/09/2019   ENDOVEIN HARVEST OF GREATER SAPHENOUS VEIN Right 04/27/2014   Procedure: ENDOVEIN HARVEST OF GREATER SAPHENOUS VEIN;  Surgeon: Delight Ovens, MD;  Location: [facility] OR;  Service: Open Heart Surgery;  Laterality: Right;   EXCISION ORAL TUMOR N/A 03/01/2018   Procedure: EXCISION ORAL TUMOR;  Surgeon: Christia Reading, MD;  Location: [facility];  Service: ENT;  Laterality: N/A;   EYE SURGERY     LASER   EYE SURGERY Bilateral    astigmatism correction    HARDWARE REMOVAL Left 10/03/2020   Procedure: LEFT FOOT REMOVAL HARDWARE, PLACE VANC POWDER;  Surgeon: Nadara Mustard, MD;  Location: [facility] OR;  Service: Orthopedics;  Laterality: Left;   INTRAOPERATIVE TRANSESOPHAGEAL ECHOCARDIOGRAM N/A 04/27/2014   Procedure: INTRAOPERATIVE TRANSESOPHAGEAL ECHOCARDIOGRAM;  Surgeon: Delight Ovens, MD;  Location: [facility] OR;  Service: Open Heart Surgery;  Laterality: N/A;   IR RADIOLOGIST EVAL & MGMT  12/22/2022   LEFT HEART CATHETERIZATION WITH CORONARY  ANGIOGRAM N/A 04/25/2014   Procedure: LEFT HEART CATHETERIZATION WITH CORONARY ANGIOGRAM;  Surgeon: Pamella Pert, MD;  Location: [facility] CATH LAB;  Service: Cardiovascular;  Laterality: N/A;   MOUTH SURGERY  11/15/2017   tongue surgery    ORIF TOE FRACTURE Left 03/22/2020   Procedure: OPEN REDUCTION INTERNAL FIXATION (ORIF) Non Union 5th Metatarsal;  Surgeon: Kathryne Hitch, MD;  Location: [facility];  Service: Orthopedics;  Laterality: Left;   Social History   Occupational History   Not on file  Tobacco Use   Smoking status: Some Days    Packs/day: 0.25    Years: 34.00    Additional pack years: 0.00    Total pack years: 8.50    Types: Cigarettes   Smokeless tobacco: Never  Vaping Use   Vaping Use: Never used  Substance and Sexual Activity   Alcohol use: Yes    Alcohol/week: 0.0 standard drinks of alcohol    Comment: RARE   Drug use: No   Sexual activity: Not on file

## 2023-01-19 ENCOUNTER — Telehealth: Payer: Self-pay | Admitting: Orthopedic Surgery

## 2023-01-19 NOTE — Telephone Encounter (Signed)
[name] called asked if Dr. [name] will look in his chart at the notes from Dr. Chestine Spore. [name] said he had vein surgery 01/15/2023 by Dr. Chestine Spore. [name] asked if he can refill his antibiotics for him?   The number to contact [name] is [phone]

## 2023-01-19 NOTE — Telephone Encounter (Signed)
Pt informed of below per Dr. [name]. He will be seen in our office tomorrow afternoon.

## 2023-01-20 ENCOUNTER — Ambulatory Visit: Payer: 59 | Admitting: Orthopedic Surgery

## 2023-01-20 DIAGNOSIS — L97421 Non-pressure chronic ulcer of left heel and midfoot limited to breakdown of skin: Secondary | ICD-10-CM

## 2023-01-20 MED ORDER — DOXYCYCLINE HYCLATE 100 MG PO TABS: 100.0000 mg | ORAL_TABLET | Freq: Two times a day (BID) | ORAL | 0 refills | Status: DC

## 2023-01-21 ENCOUNTER — Encounter: Payer: Self-pay | Admitting: Orthopedic Surgery

## 2023-01-21 NOTE — Progress Notes (Signed)
Office Visit Note   Patient: [name]           Date of Birth: [birth year]           MRN: [id number] Visit Date: 01/20/2023              Requested by: Garlan Fillers, MD [address] [place],  [place] [address] PCP: Garlan Fillers, MD  Chief Complaint  Patient presents with   Left Foot - Follow-up      HPI: Patient presents in follow-up for left Achilles heel ulcer.  Patient is status post endovascular study with Dr. Chestine Spore.  Patient has two-vessel inline flow to the ankle.  Patient has no revascularization options is felt that he should have sufficient circulation to heal the ulcer.   Assessment & Plan: Visit Diagnoses:  1. Heel ulcer, left, limited to breakdown of skin (HCC)     Plan: Continue with the dressing changes.   Patient is provided a prescription for doxycycline that he may start if he develops an infection while he is out of town or over the weekend.  Follow-Up Instructions: Return in about 4 weeks (around 02/17/2023).   Ortho Exam  Patient is alert, oriented, no adenopathy, well-dressed, normal affect, normal respiratory effort. Examination patient has a ulcer over the Achilles that is 3 mm in diameter there is no surrounding cellulitis no drainage no depth to the wound.  Imaging: No results found. No images are attached to the encounter.  Labs: Lab Results  Component Value Date   HGBA1C 7.4 (H) 04/25/2014   HGBA1C 7.4 (H) 04/25/2014   ESRSEDRATE 9 09/26/2019   ESRSEDRATE 12 09/09/2017   ESRSEDRATE 58 (H) 06/01/2014   REPTSTATUS 10/08/2020 FINAL 10/03/2020   GRAMSTAIN  10/03/2020    FEW WBC PRESENT, PREDOMINANTLY MONONUCLEAR NO ORGANISMS SEEN    CULT  10/03/2020    NO ANAEROBES ISOLATED Performed at [facility], [address] [place], [place] [address]      Lab Results  Component Value Date   ALBUMIN 3.7 03/15/2018   ALBUMIN 3.7 09/09/2017   ALBUMIN 4.2 03/30/2017    Lab Results  Component Value Date   MG 2.3  04/28/2014   MG 2.4 04/28/2014   MG 2.7 (H) 04/27/2014   Lab Results  Component Value Date   VD25OH 57 06/22/2019    No results found for: "PREALBUMIN"    Latest Ref Rng & Units 01/15/2023    1:35 PM 04/01/2021    3:37 PM 10/03/2020    6:45 AM  CBC EXTENDED  WBC 4.0 - 10.5 K/uL   12.9   RBC 4.22 - 5.81 MIL/uL   3.79   Hemoglobin 13.0 - 17.0 g/dL 82.9  56.2  13.0   HCT 39.0 - 52.0 % 38.0  33.9  36.6   Platelets 150 - 400 K/uL   275      There is no height or weight on file to calculate BMI.  Orders:  No orders of the defined types were placed in this encounter.  Meds ordered this encounter  Medications   doxycycline (VIBRA-TABS) 100 MG tablet    Sig: Take 1 tablet (100 mg total) by mouth 2 (two) times daily.    Dispense:  40 tablet    Refill:  0     Procedures: No procedures performed  Clinical Data: No additional findings.  ROS:  All other systems negative, except as noted in the HPI. Review of Systems  Objective: Vital  Signs: There were no vitals taken for this visit.  Specialty Comments:  No specialty comments available.  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 03/22/2020   Osteopenia of multiple sites 02/28/2020   PVD (peripheral vascular disease) (HCC) 10/11/2019   Chronic venous insufficiency 10/05/2018   Diabetic cataract (HCC) 05/21/2018   Controlled type 1 diabetes mellitus with diabetic peripheral angiopathy without gangrene (HCC) 08/14/2017   Dyslipidemia 03/26/2017   High risk medication use 09/23/2016   History of hypertension 09/23/2016   History of chronic kidney disease 09/23/2016   History of coronary artery disease 09/23/2016   Tobacco abuse 09/23/2016   Primary osteoarthritis of both knees 09/23/2016   History of diabetes mellitus 09/23/2016   Rheumatoid nodulosis  (HCC) 09/23/2016   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 (HCC) 11/20/2015   Chest pain with high risk for cardiac etiology 10/29/2015   Asymptomatic bilateral carotid artery stenosis 06/08/2014   Pleural effusion, bilateral 06/03/2014   Dyspnea 06/01/2014   Diabetes (HCC) 05/03/2014   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/03/2014   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   Chronic renal disease, stage 3, moderately decreased glomerular filtration rate between 30-59 mL/min/1.73 square meter (HCC) 05/03/2014   CAD (coronary artery disease) 04/27/2014   Acne 12/19/2013   On isotretinoin therapy 12/19/2013   Nuclear sclerotic cataract, bilateral 12/19/2011   Vitreous hemorrhage (HCC) 06/16/2011   Past Medical History:  Diagnosis Date   Anginal pain (HCC)    Anxiety    Arthritis    RA IN HANDS   Asthma    CAD (coronary artery disease) 04/27/2014   Chronic renal disease, stage 3, moderately decreased glomerular filtration rate between 30-59 mL/min/1.73 square meter (HCC) 05/03/2014   COPD (chronic obstructive pulmonary disease) (HCC)    Coronary artery disease    Diabetes (HCC) 05/03/2014   Diabetes mellitus without complication (HCC)    type 1   Hyperlipidemia    Left shoulder pain    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/03/2014   Shortness of breath    Sleep apnea    mild OSA, no CPAP   Unstable angina pectoris (HCC) 04/25/2014    Family History  Problem Relation Age of Onset   Stomach cancer Mother    Cancer Mother    Prostate cancer Father    Heart disease Father    Cancer - Prostate Father    Heart disease Brother    Asthma Daughter    Healthy Daughter    Asthma Daughter     Past Surgical History:  Procedure Laterality Date   ABDOMINAL AORTOGRAM W/LOWER EXTREMITY N/A 01/15/2023   Procedure: ABDOMINAL AORTOGRAM W/LOWER EXTREMITY;  Surgeon: Cephus Shelling, MD;  Location: [facility] INVASIVE CV  LAB;  Service: Cardiovascular;  Laterality: N/A;   CARDIAC CATHETERIZATION  04/25/2014   BY DR Jacinto Halim   CARDIAC CATHETERIZATION N/A 10/30/2015   Procedure: Left Heart Cath and Cors/Grafts Angiography;  Surgeon: Yates Decamp, MD;  Location: [facility] INVASIVE CV LAB;  Service: Cardiovascular;  Laterality: N/A;   CARPAL TUNNEL RELEASE     CATARACT EXTRACTION, BILATERAL     CORONARY ARTERY BYPASS GRAFT N/A 04/27/2014   Procedure: CORONARY ARTERY BYPASS GRAFTING on pump using left internal mammary artery to LAD coronary artery, right great saphenous vein graft to diagonal coronary  artery with sequential to OM1 and circumflex coronary arteries. Right greater saphenous vein graft to posterior descending coronary artery. ;  Surgeon: Delight Ovens, MD;  Location: [facility] OR;  Service: Open Heart Surgery;  Laterality: N   elbow drained Left 05/09/2019   ENDOVEIN HARVEST OF GREATER SAPHENOUS VEIN Right 04/27/2014   Procedure: ENDOVEIN HARVEST OF GREATER SAPHENOUS VEIN;  Surgeon: Delight Ovens, MD;  Location: [facility] OR;  Service: Open Heart Surgery;  Laterality: Right;   EXCISION ORAL TUMOR N/A 03/01/2018   Procedure: EXCISION ORAL TUMOR;  Surgeon: Christia Reading, MD;  Location: [facility];  Service: ENT;  Laterality: N/A;   EYE SURGERY     LASER   EYE SURGERY Bilateral    astigmatism correction    HARDWARE REMOVAL Left 10/03/2020   Procedure: LEFT FOOT REMOVAL HARDWARE, PLACE VANC POWDER;  Surgeon: Nadara Mustard, MD;  Location: [facility] OR;  Service: Orthopedics;  Laterality: Left;   INTRAOPERATIVE TRANSESOPHAGEAL ECHOCARDIOGRAM N/A 04/27/2014   Procedure: INTRAOPERATIVE TRANSESOPHAGEAL ECHOCARDIOGRAM;  Surgeon: Delight Ovens, MD;  Location: [facility] OR;  Service: Open Heart Surgery;  Laterality: N/A;   IR RADIOLOGIST EVAL & MGMT  12/22/2022   LEFT HEART CATHETERIZATION WITH CORONARY ANGIOGRAM N/A 04/25/2014   Procedure: LEFT HEART CATHETERIZATION WITH CORONARY ANGIOGRAM;  Surgeon: Pamella Pert, MD;  Location:  [facility] CATH LAB;  Service: Cardiovascular;  Laterality: N/A;   MOUTH SURGERY  11/15/2017   tongue surgery    ORIF TOE FRACTURE Left 03/22/2020   Procedure: OPEN REDUCTION INTERNAL FIXATION (ORIF) Non Union 5th Metatarsal;  Surgeon: Kathryne Hitch, MD;  Location: [facility];  Service: Orthopedics;  Laterality: Left;   Social History   Occupational History   Not on file  Tobacco Use   Smoking status: Some Days    Packs/day: 0.25    Years: 34.00    Additional pack years: 0.00    Total pack years: 8.50    Types: Cigarettes   Smokeless tobacco: Never  Vaping Use   Vaping Use: Never used  Substance and Sexual Activity   Alcohol use: Yes    Alcohol/week: 0.0 standard drinks of alcohol    Comment: RARE   Drug use: No   Sexual activity: Not on file

## 2023-01-22 ENCOUNTER — Ambulatory Visit: Payer: 59 | Admitting: Podiatry

## 2023-01-22 ENCOUNTER — Encounter: Payer: Self-pay | Admitting: Podiatry

## 2023-01-22 DIAGNOSIS — I739 Peripheral vascular disease, unspecified: Secondary | ICD-10-CM

## 2023-01-22 DIAGNOSIS — E1042 Type 1 diabetes mellitus with diabetic polyneuropathy: Secondary | ICD-10-CM | POA: Diagnosis not present

## 2023-01-22 DIAGNOSIS — B351 Tinea unguium: Secondary | ICD-10-CM

## 2023-01-22 DIAGNOSIS — M79676 Pain in unspecified toe(s): Secondary | ICD-10-CM | POA: Diagnosis not present

## 2023-01-22 NOTE — Progress Notes (Signed)
He presents today for follow-up of his painful elongated toenails and seen Dr. Due to currently for a wound that developed May 5.  He has seen Dr. Due to Dr. Jarold Motto and Dr. Chestine Spore and the wound care center is currently treating the wound posterior aspect of the left leg he is currently decreased from 1-1/2 packs/day to 4 cigarettes a day.  He saw vascular had attempted angioplasty with stenting however he had 100% terminus was unable to stent.  Objective: Pulses are palpable dorsal aspect of the left foot.  There is some digital hair growth present the foot is warm to the touch similar to the right toenails are long thick yellow dystrophic-like mycotic he has a wound dressed on the posterior aspect of the ankle.  Assessment: Pain in limb secondary to vascular disease onychomycosis ulcer left heel.  Plan: Discussed etiology pathology conservative therapies continue wound care and debrided nails for him today.  Encouraged him to continue to decrease his nicotine habit.

## 2023-01-23 ENCOUNTER — Encounter (HOSPITAL_BASED_OUTPATIENT_CLINIC_OR_DEPARTMENT_OTHER): Payer: 59 | Admitting: Internal Medicine

## 2023-01-23 DIAGNOSIS — J449 Chronic obstructive pulmonary disease, unspecified: Secondary | ICD-10-CM

## 2023-01-23 DIAGNOSIS — S91302A Unspecified open wound, left foot, initial encounter: Secondary | ICD-10-CM

## 2023-01-23 DIAGNOSIS — E10621 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: Secondary | ICD-10-CM

## 2023-01-23 DIAGNOSIS — L97528 Non-pressure chronic ulcer of other part of left foot with other specified severity: Secondary | ICD-10-CM | POA: Diagnosis not present

## 2023-01-23 LAB — GLUCOSE, CAPILLARY
Glucose-Capillary: 133 mg/dL — ABNORMAL HIGH (ref 70–99)
Glucose-Capillary: 64 mg/dL — ABNORMAL LOW (ref 70–99)

## 2023-01-23 NOTE — Progress Notes (Addendum)
[name], [name] ([id number]) [id number] Page 1 of 10 Visit Report for 01/23/2023 Chief Complaint Document Details Patient Name: Date of Service: [name], [name] 01/23/2023 8:00 A M Medical Record Number: [id number] Patient Account Number: [id number] Date of Birth/Sex: Treating RN: Dec 12, 1965 (57 y.o. M) Primary Care Provider: Jarome [name] Other Clinician: Referring Provider: Treating Provider/Extender: Donnetta Hail in Treatment: 4 Information Obtained from: Patient Chief Complaint 12/25/2022; wound to the left Achilles region Electronic Signature(s) Signed: 01/23/2023 1:00:24 PM By: Geralyn Corwin DO Entered By: Geralyn Corwin on 01/23/2023 09:41:39 -------------------------------------------------------------------------------- Debridement Details Patient Name: Date of Service: [name], [name] [name] 01/23/2023 8:00 A M Medical Record Number: [id number] Patient Account Number: [id number] Date of Birth/Sex: Treating RN: Feb 08, 1966 (57 y.o. [name] Primary Care Provider: Jarome [name] Other Clinician: Referring Provider: Treating Provider/Extender: Donnetta Hail in Treatment: 4 Debridement Performed for Assessment: Wound #1 Left Achilles Performed By: Physician Geralyn Corwin, DO Debridement Type: Debridement Severity of Tissue Pre Debridement: Fat layer exposed Level of Consciousness (Pre-procedure): Awake and Alert Pre-procedure Verification/Time Out Yes - 08:55 Taken: Start Time: 09:00 Pain Control: Lidocaine 4% T opical Solution Percent of Wound Bed Debrided: 100% T Area Debrided (cm): otal 0.63 Tissue and other material debrided: Non-Viable, Slough, Slough Level: Non-Viable Tissue Debridement Description: Selective/Open Wound Instrument: Blade, Forceps Bleeding: Minimum Hemostasis Achieved: Pressure Procedural Pain: 0 Post Procedural Pain: 0 Response to Treatment:  Procedure was tolerated well Level of Consciousness (Post- Awake and Alert procedure): Post Debridement Measurements of Total Wound Length: (cm) 1 Width: (cm) 0.8 Depth: (cm) 0.1 Volume: (cm) 0.063 Character of Wound/Ulcer Post Debridement: Improved Severity of Tissue Post Debridement: Fat layer exposed [name] ([id number]) [id number] Page 2 of 10 Post Procedure Diagnosis Same as Pre-procedure Notes Scribed for Dr Mikey Bussing by Redmond Pulling, RN Electronic Signature(s) Signed: 01/23/2023 12:44:31 PM By: Redmond Pulling RN, BSN Signed: 01/23/2023 1:00:24 PM By: Geralyn Corwin DO Entered By: Redmond Pulling on 01/23/2023 09:02:52 -------------------------------------------------------------------------------- HPI Details Patient Name: Date of Service: [name], [name] [name] 01/23/2023 8:00 A M Medical Record Number: [id number] Patient Account Number: [id number] Date of Birth/Sex: Treating RN: 27-Aug-1965 (57 y.o. M) Primary Care Provider: Jarome [name] Other Clinician: Referring Provider: Treating Provider/Extender: Donnetta Hail in Treatment: 4 History of Present Illness HPI Description: 12/25/2022 [name] is a 57 year old male with a past medical history of controlled insulin-dependent type 1 diabetes, COPD and rheumatoid arthritis that presents the clinic for a 1 month history of nonhealing ulcer to the left foot. On 03/2020 patient had surgery for left fifth metatarsal fracture and subsequently had hardware removal in March 2022 due to infection. Over the past year he has experienced pain to the lateral left foot however the incision site and wound has healed. 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 He follows with Dr. Lajoyce [name] for this issue. Due to pain to the lateral left foot he uses Hoka sneakers however this rubbed at the back of his  heel creating a wound to the Achilles region. He has since changed his shoe wear to boots that come up above this area or crocs. He has been able to offload this area over the past couple weeks. He is currently on doxycycline, pentoxifylline, nitroglycerin patch and cilostazol by Dr. Lajoyce [name] for his wound care. Currently patient denies signs of infection. ABI in office was 1.3 and he is scheduled for arterial  duplex studies on 01/28/23. 5/30; patient presents for follow-up. He has been using Santyl to the wound bed. He had his ABIs completed that showed noncompressible on the left with TBI of 0.35. Currently denies signs of infection. 6/10; patient presents for follow-up. He has been using Santyl to the wound bed. He had been referred to vein and vascular at our last clinic visit however he cannot be seen until 6/25. He is concerned about the wound. We will call today to see if we can facilitate an earlier appointment. He denies signs of infection. He has a 40 pack smoking history and currently smokes about half a pack a day. 6/21; patient presents for follow-up. 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 He feels a plantar arch with very brisk flow into the foot. There is some disease in the distal anterior tibial that did not appear flow- limiting. No intervention necessary. It was thought that he had enough blood flow for healing. He continues to use Santyl. Electronic Signature(s) Signed: 01/23/2023 1:00:24 PM By: Geralyn Corwin DO Entered By: Geralyn Corwin on 01/23/2023 09:42:39 -------------------------------------------------------------------------------- Physical Exam Details Patient Name: Date of Service: [place], [name] [name] 01/23/2023 8:00 A M Medical Record Number: [id number] Patient Account Number: [id number] Date of Birth/Sex: Treating RN: 05/11/1966 (57 y.o. M) Primary Care  Provider: Jarome [name] Other Clinician: Referring Provider: Treating Provider/Extender: Donnetta Hail in Treatment: 4 Constitutional respirations regular, non-labored and within target range for patient.[name], [name] ([id number]) [id number] Page 3 of 10 Cardiovascular 2+ dorsalis pedis/posterior tibialis pulses. Psychiatric pleasant and cooperative. Notes T the Achilles region there is an open wound with nonviable tissue throughout with a rim of granulation tissue. no signs of infection including increased warmth, o Erythema, or purulent drainage. Minimal pain on palpation. No increased swelling. Electronic Signature(s) Signed: 01/23/2023 1:00:24 PM By: Geralyn Corwin DO Entered By: Geralyn Corwin on 01/23/2023 09:59:22 -------------------------------------------------------------------------------- Physician Orders Details Patient Name: Date of Service: [place], [name] [name] 01/23/2023 8:00 A M Medical Record Number: [id number] Patient Account Number: [id number] Date of Birth/Sex: Treating RN: April 12, 1966 (57 y.o. [name] Primary Care Provider: Jarome [name] Other Clinician: Referring Provider: Treating Provider/Extender: Donnetta Hail in Treatment: 4 Verbal / Phone Orders: No Diagnosis Coding Follow-up Appointments ppointment in 2 weeks. - Dr. Mikey Bussing Thursday 01/27/2023 @ 0800 Return A Other: - T- COM to be performed by Hyperbaric tech Anesthetic Wound #1 Left Achilles (In clinic) Topical Lidocaine 4% applied to wound bed Bathing/ Shower/ Hygiene May shower and wash wound with soap and water. - clean wound with Dial antibacterial soap, then apply new dressing Wound Treatment Wound #1 - Achilles Wound Laterality: Left Cleanser: Soap and Water 1 x Per Day/15 Days Discharge Instructions: May shower and wash wound with dial antibacterial soap and water prior to dressing  change. Cleanser: Vashe 5.8 (oz) 1 x Per Day/15 Days Discharge Instructions: Cleanse the wound with Vashe prior to applying a clean dressing using gauze sponges, not tissue or cotton balls. Peri-Wound Care: Skin Prep (Generic) 1 x Per Day/15 Days Discharge Instructions: Use skin prep as directed Prim Dressing: Santyl Ointment 1 x Per Day/15 Days ary Discharge Instructions: Apply nickel thick amount to wound bed as instructed Secondary Dressing: Zetuvit Plus Silicone Border Dressing 4x4 (in/in) (Generic) 1 x Per Day/15 Days Discharge Instructions: Apply silicone border over primary dressing as directed. Patient Medications llergies: metoprolol, niacin,  tramadol A Notifications Medication Indication Start End 01/23/2023 lidocaine DOSE topical 4 % cream - cream topical once daily Electronic Signature(s) Signed: 01/23/2023 1:00:24 PM By: Stanton Kidney, Signed: 01/23/2023 1:00:24 PM By: Dickie La ([id number]) [id number] Page 4 of 10 Entered By: Geralyn Corwin on 01/23/2023 09:59:30 -------------------------------------------------------------------------------- Problem List Details Patient Name: Date of Service: [name], [name] 01/23/2023 8:00 A M Medical Record Number: [id number] Patient Account Number: [id number] Date of Birth/Sex: Treating RN: 05-Sep-1965 (57 y.o. M) Primary Care Provider: Jarome [name] Other Clinician: Referring Provider: Treating Provider/Extender: Donnetta Hail in Treatment: 4 Active Problems ICD-10 Encounter Code Description Active Date MDM Diagnosis S91.302A Unspecified open wound, left foot, initial encounter 12/25/2022 No Yes L97.528 Non-pressure chronic ulcer of other part of left foot with other specified 12/25/2022 No Yes severity 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 12/25/2022 No Yes J44.9 Chronic obstructive pulmonary disease, unspecified 12/25/2022 No  Yes M06.9 Rheumatoid arthritis, unspecified 12/25/2022 No Yes Inactive Problems Resolved Problems Electronic Signature(s) Signed: 01/23/2023 1:00:24 PM By: Geralyn Corwin DO Entered By: Geralyn Corwin on 01/23/2023 09:41:24 -------------------------------------------------------------------------------- Progress Note Details Patient Name: Date of Service: [place], [name] [name] 01/23/2023 8:00 A M Medical Record Number: [id number] Patient Account Number: [id number] Date of Birth/Sex: Treating RN: [birth year] (57 y.o. M) Primary Care Provider: Jarome [name] Other Clinician: Referring Provider: Treating Provider/Extender: Donnetta Hail in Treatment: 4 Subjective Chief Complaint [name], [name] ([id number]) [id number] Page 5 of 10 Information obtained from Patient 12/25/2022; wound to the left Achilles region History of Present Illness (HPI) 12/25/2022 Mr. Kendric Sindelar is a 57 year old male with a past medical history of controlled insulin-dependent type 1 diabetes, COPD and rheumatoid arthritis that presents the clinic for a 1 month history of nonhealing ulcer to the left foot. On 03/2020 patient had surgery for left fifth metatarsal fracture and subsequently had hardware removal in March 2022 due to infection. Over the past year he has experienced pain to the lateral left foot however the incision site and wound has healed. 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 He follows with Dr. Lajoyce [name] for this issue. Due to pain to the lateral left foot he uses Hoka sneakers however this rubbed at the back of his heel creating a wound to the Achilles region. He has since changed his shoe wear to boots that come up above this area or crocs. He has been able to offload this area over the past couple weeks. He is currently on doxycycline, pentoxifylline, nitroglycerin  patch and cilostazol by Dr. Lajoyce [name] for his wound care. Currently patient denies signs of infection. ABI in office was 1.3 and he is scheduled for arterial duplex studies on 01/28/23. 5/30; patient presents for follow-up. He has been using Santyl to the wound bed. He had his ABIs completed that showed noncompressible on the left with TBI of 0.35. Currently denies signs of infection. 6/10; patient presents for follow-up. He has been using Santyl to the wound bed. He had been referred to vein and vascular at our last clinic visit however he cannot be seen until 6/25. He is concerned about the wound. We will call today to see if we can facilitate an earlier appointment. He denies signs of infection. He has a 40 pack smoking history and currently smokes about half a pack a day. 6/21; patient presents for follow-up. 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 He feels a plantar arch with very brisk flow into the foot. There is some disease in the distal anterior tibial that did not appear flow- limiting. No intervention necessary. It was thought that he had enough blood flow for healing. He continues to use Santyl. Patient History Information obtained from Patient. Family History Cancer - Father,Mother, Heart Disease - Father, Hypertension - Father, Seizures - Mother, No family history of Diabetes, Kidney Disease, Lung Disease, Stroke, Thyroid Problems, Tuberculosis. Social History Current every day smoker - 40+years, Marital Status - Divorced, Alcohol Use - Never, Drug Use - No History, Caffeine Use - Daily. Medical History Eyes Patient has history of Cataracts Cardiovascular Patient has history of Coronary Artery Disease, Hypertension, Peripheral Venous Disease Endocrine Patient has history of Type I Diabetes Musculoskeletal Patient has history of Rheumatoid Arthritis,  Osteoarthritis Hospitalization/Surgery History - internal fixation of left 5th toe fracture. - hardware removal 2022 left foot. - 2020 elbow drained. - 2019 excision oral tumor. - 2019 mouth surgery. - 2017 cardiac cath. 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 - carpal tunnel release. - cataract extraction, bilateral. Medical A Surgical History Notes nd Eyes diabetic retinopathy Respiratory pleural effusion, bilateral Genitourinary CKDIII Musculoskeletal Rheumatoid nodulosis Osteopenia Objective Constitutional respirations regular, non-labored and within target range for patient.. Vitals Time Taken: 8:06 AM, Height: 68 in, Weight: 200 lbs, BMI: 30.4, Temperature: 97.9 F, Pulse: 69 bpm, Respiratory Rate: 18 breaths/min, Blood Pressure: 131/75 mmHg, Capillary Blood Glucose: 248 mg/dl. Cardiovascular 2+ dorsalis pedis/posterior tibialis pulses. Psychiatric pleasant and cooperative. General Notes: T the Achilles region there is an open wound with nonviable tissue throughout with a rim of granulation tissue. no signs of infection including o increased warmth, Erythema, or purulent drainage. Minimal pain on palpation. No increased swelling. Integumentary (Hair, Skin) Wound #1 status is Open. Original cause of wound was Gradually Appeared. The date acquired was: 12/07/2022. The wound has been in treatment 4 weeks. The wound is located on the Left Achilles. The wound measures 1cm length x 0.8cm width x 0.1cm depth; 0.628cm^2 area and 0.063cm^3 volume. There is Fat [name], [name] ([id number]) [id number] Page 6 of 10 Layer (Subcutaneous Tissue) exposed. There is no tunneling or undermining noted. There is a medium amount of serosanguineous drainage noted. The wound margin is distinct with the outline attached to the wound base. There is no granulation within the wound bed. There is a large (67-100%) amount of necrotic tissue within the wound bed including Adherent Slough. The periwound skin  appearance did not exhibit: Callus, Crepitus, Excoriation, Induration, Rash, Scarring, Dry/Scaly, Maceration, Atrophie Blanche, Cyanosis, Ecchymosis, Hemosiderin Staining, Mottled, Pallor, Rubor, Erythema. Assessment Active Problems ICD-10 Unspecified open wound, left foot, initial encounter Non-pressure chronic ulcer of other part of left foot with other specified severity 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 Chronic obstructive pulmonary disease, unspecified Rheumatoid arthritis, unspecified Patient had a left arteriogram with no intervention. It was thought he has enough blood flow for wound healing. I was able to debride loose nonviable tissue. He is starting to have a rim of granulation tissue circumferentially. I recommended continuing Santyl. It does report that he has an occluded posterior tibial artery throughout. Patient states that Dr. Lajoyce [name] his orthopedic surgeon and his PCP Dr. Jarold Motto inquired hyperbaric oxygen therapy to help address arterial disease and further aid in healing. He would benefit from TCOM to measure oxygenation to the tissue surrounding the wound to more accurately determine  if he has blood flow for healing. He was agreeable to this. Continue aggressive offloading. Follow-up in 1 week. Procedures Wound #1 Pre-procedure diagnosis of Wound #1 is a Diabetic Wound/Ulcer of the Lower Extremity located on the Left Achilles .Severity of Tissue Pre Debridement is: Fat layer exposed. There was a Selective/Open Wound Non-Viable Tissue Debridement with a total area of 0.63 sq cm performed by Geralyn Corwin, DO. With the following instrument(s): Blade, and Forceps to remove Non-Viable tissue/material. Material removed includes Slough after achieving pain control using Lidocaine 4% T opical Solution. No specimens were taken. A time out was conducted at 08:55, prior to the start of the procedure. A Minimum amount of bleeding was controlled with Pressure. The procedure was  tolerated well with a pain level of 0 throughout and a pain level of 0 following the procedure. Post Debridement Measurements: 1cm length x 0.8cm width x 0.1cm depth; 0.063cm^3 volume. Character of Wound/Ulcer Post Debridement is improved. Severity of Tissue Post Debridement is: Fat layer exposed. Post procedure Diagnosis Wound #1: Same as Pre-Procedure General Notes: Scribed for Dr Mikey Bussing by Redmond Pulling, RN. Plan Follow-up Appointments: Return Appointment in 2 weeks. - Dr. Mikey Bussing Thursday 01/27/2023 @ 0800 Other: - T- COM to be performed by Hyperbaric tech Anesthetic: Wound #1 Left Achilles: (In clinic) Topical Lidocaine 4% applied to wound bed Bathing/ Shower/ Hygiene: May shower and wash wound with soap and water. - clean wound with Dial antibacterial soap, then apply new dressing The following medication(s) was prescribed: lidocaine topical 4 % cream cream topical once daily was prescribed at facility WOUND #1: - Achilles Wound Laterality: Left Cleanser: Soap and Water 1 x Per Day/15 Days Discharge Instructions: May shower and wash wound with dial antibacterial soap and water prior to dressing change. Cleanser: Vashe 5.8 (oz) 1 x Per Day/15 Days Discharge Instructions: Cleanse the wound with Vashe prior to applying a clean dressing using gauze sponges, not tissue or cotton balls. Peri-Wound Care: Skin Prep (Generic) 1 x Per Day/15 Days Discharge Instructions: Use skin prep as directed Prim Dressing: Santyl Ointment 1 x Per Day/15 Days ary Discharge Instructions: Apply nickel thick amount to wound bed as instructed Secondary Dressing: Zetuvit Plus Silicone Border Dressing 4x4 (in/in) (Generic) 1 x Per Day/15 Days Discharge Instructions: Apply silicone border over primary dressing as directed. 1. In office sharp debridement 2. Santyl 3. TCOM 4. Follow-up in 1 week 5. Aggressive offloading Electronic Signature(s) Signed: 03/03/2023 12:34:47 PM By: Geralyn Corwin DO Previous  Signature: 01/26/2023 5:35:38 PM Version By: Shawn Stall RN, BSN Previous Signature: 01/27/2023 4:25:00 PM Version By: Geralyn Corwin DO Previous Signature: 01/23/2023 1:00:24 PM Version By: Zane Herald ([id number]) [id number] Page 7 of 10 Entered By: Geralyn Corwin on 03/03/2023 12:15:50 -------------------------------------------------------------------------------- HxROS Details Patient Name: Date of Service: [name], [name] 01/23/2023 8:00 A M Medical Record Number: [id number] Patient Account Number: [id number] Date of Birth/Sex: Treating RN: 1965/11/10 (57 y.o. M) Primary Care Provider: Jarome [name] Other Clinician: Referring Provider: Treating Provider/Extender: Donnetta Hail in Treatment: 4 Information Obtained From Patient Eyes Medical History: Positive for: Cataracts Past Medical History Notes: diabetic retinopathy Respiratory Medical History: Past Medical History Notes: pleural effusion, bilateral Cardiovascular Medical History: Positive for: Coronary Artery Disease; Hypertension; Peripheral Venous Disease Endocrine Medical History: Positive for: Type I Diabetes Treated with: Insulin Blood sugar tested every day: Yes Tested : Genitourinary Medical History: Past Medical History Notes: CKDIII Musculoskeletal Medical History: Positive for: Rheumatoid Arthritis; Osteoarthritis Past Medical History  Notes: Rheumatoid nodulosis Osteopenia HBO Extended History Items Eyes: Cataracts Immunizations Pneumococcal Vaccine: Received Pneumococcal Vaccination: No Implantable Devices None Hospitalization / Surgery History Type of Hospitalization/Surgery internal fixation of left 5th toe fracture hardware removal 2022 left foot [name], VALADEZ ([id number]) [phone].pdf Page 8 of 10 2020 elbow drained 2019 excision oral tumor 2019 mouth surgery 2017  cardiac cath 2015 CABG carpal tunnel release cataract extraction, bilateral Family and Social History Cancer: Yes - Father,Mother; Diabetes: No; Heart Disease: Yes - Father; Hypertension: Yes - Father; Kidney Disease: No; Lung Disease: No; Seizures: Yes - Mother; Stroke: No; Thyroid Problems: No; Tuberculosis: No; Current every day smoker - 40+years; Marital Status - Divorced; Alcohol Use: Never; Drug Use: No History; Caffeine Use: Daily; Financial Concerns: No; Food, Clothing or Shelter Needs: No; Support System Lacking: No; Transportation Concerns: No Electronic Signature(s) Signed: 01/23/2023 1:00:24 PM By: Geralyn Corwin DO Entered By: Geralyn Corwin on 01/23/2023 09:42:44 -------------------------------------------------------------------------------- TCOM Details Patient Name: Date of Service: [name], [name] [name] 01/23/2023 8:00 A M Medical Record Number: [id number] Patient Account Number: [id number] Date of Birth/Sex: Treating RN: [birth year] (57 y.o. [name] Primary Care Provider: Jarome [name] Other Clinician: Haywood [name] Referring Provider: Treating Provider/Extender: Donnetta Hail in Treatment: 4 Diagnosis Diagnosis: Ulcer of lower extremity, except decubitus Code: 707.1 Photos TCOM Site Information Site #1 Left Medial Foot Inferior to Wound Achilles Region Location Description: Regional Perfusion Index: Baseline @2ATA : MeasurementFORTUNE, [name] ([id number]) [id number] Page 9 of 10 Extremity Oxygen Oxygen Final 15 min Elevated Challenge Challenge 15 min 30 min 45 min 60 min 75 min 90 min Time: Reading 15 min 5 min 10 min 72 66 173 182 - - - - - - - mmHg: Site #2 Left Lateral Lower Leg Location Description: Regional Perfusion Index: Baseline @2ATA : Measurement: Extremity Oxygen Oxygen Final 15 min Elevated Challenge Challenge 15 min 30 min 45 min 60 min 75 min 90 min Time:  Reading 15 min 5 min 10 min 68 51 159 176 - - - - - - - mmHg: Site #3 Left Medial Foot Superior to Wound Achilles Region Location Description: Regional Perfusion Index: Baseline @2ATA : Measurement: Extremity Oxygen Oxygen Final 15 min Elevated Challenge Challenge 15 min 30 min 45 min 60 min 75 min 90 min Time: Reading 15 min 5 min 10 min 70 67 162 176 - - - - - - - mmHg: Site #4 Left Lateral foot Superior to wound Achilles Regio Location Description: Regional Perfusion Index: Baseline @2ATA : Measurement: Extremity Oxygen Oxygen Final 15 min Elevated Challenge Challenge 15 min 30 min 45 min 60 min 75 min 90 min Time: Reading 15 min 5 min 10 min 60 54 120 129 - - - - - - - mmHg: Air Breaks Given: No Notes Lead 2 was on the bony area of calcaneus and would not hold contact properly with skin. Opted to use lower leg as a reference. Electronic Signature(s) Signed: 01/23/2023 2:02:25 PM By: [name] CHT EMT BS , , Signed: 01/26/2023 4:29:34 PM By: Geralyn Corwin DO Entered By: [name] on 01/23/2023 14:02:25 -------------------------------------------------------------------------------- SuperBill Details Patient Name: Date of Service: [name], [name] 01/23/2023 Medical Record Number: [id number] Patient Account Number: [id number] Date of Birth/Sex: Treating RN: [birth year] (57 y.o. 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 J44.9 Chronic obstructive pulmonary disease, unspecified M06.9 Rheumatoid arthritis, unspecified Facility Procedures : SIDDHANT, HASHEMI CodeRosezena Sensor ([id number]  [id number] I Description: 57) [id number] - DEBRIDE WOUND 1ST 20 SQ CM OR < CD-10 Diagnosis Description S91.302A Unspecified open wound, left foot, initial encounter L97.528 Non-pressure chronic ulcer of other part of left foot with other specified  severity 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 J44.9 Chronic obstructive pulmonary disease, unspecified Modifier: [id number] Page 1 Quantity: 10 of 10 : CPT4 Code: [id number] I Description: 922 - TCP02/ABI-SINGLE LEVEL CD-10 Diagnosis Description S91.302A Unspecified open wound, left foot, initial encounter L97.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 Type 1 diabetes  mellitus with foot ulcer Modifier: 1 Quantity: Physician Procedures : CPT4 Code Description Modifier [id number] - WC PHYS DEBR WO ANESTH 20 SQ CM ICD-10 Diagnosis Description S91.302A Unspecified open wound, left foot, initial encounter L97.528 Non-pressure chronic ulcer of other part of left foot with other specified  severity 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

## 2023-01-23 NOTE — Progress Notes (Signed)
[name] ([id number]) [id number] Page 1 of 7 Visit Report for 01/23/2023 Arrival Information Details Patient Name: Date of Service: [name], [name] 01/23/2023 8:00 A M Medical Record Number: [id number] Patient Account Number: [id number] Date of Birth/Sex: Treating RN: 20-Feb-1966 (57 y.o. [name] Primary Care [name]: [name] Other Clinician: Referring [name]: [name] [name]/Extender: [name] in Treatment: 4 Visit Information History Since Last Visit Added or deleted any medications: No Patient Arrived: Ambulatory Any new allergies or adverse reactions: No Arrival Time: 08:01 Had a fall or experienced change in No Accompanied By: self activities of daily living that may affect Transfer Assistance: None risk of falls: Patient Identification Verified: Yes Signs or symptoms of abuse/neglect since last visito No Secondary Verification Process Completed: Yes Hospitalized since last visit: No Patient Requires Transmission-Based Precautions: No Implantable device outside of the clinic excluding No Patient Has Alerts: No cellular tissue based products placed in the center since last visit: Has Dressing in Place as Prescribed: Yes Pain Present Now: No Electronic Signature(s) Signed: 01/23/2023 12:44:31 PM By: Redmond Pulling RN, BSN Entered By: Redmond Pulling on 01/23/2023 08:05:54 -------------------------------------------------------------------------------- Encounter Discharge Information Details Patient Name: Date of Service: [name], [name] [name] 01/23/2023 8:00 A M Medical Record Number: [id number] Patient Account Number: [id number] Date of Birth/Sex: Treating RN: [birth year] (57 y.o. [name] Primary Care [name]: [name] Other Clinician: Referring [name] [name]/Extender: [name] in Treatment: 4 Encounter Discharge  Information Items Post Procedure Vitals Discharge Condition: Stable Temperature (F): 97.9 Ambulatory Status: Ambulatory Pulse (bpm): 69 Discharge Destination: Home Respiratory Rate (breaths/min): 18 Transportation: Private Auto Blood Pressure (mmHg): 131/75 Accompanied By: self Schedule Follow-up Appointment: Yes Clinical Summary of Care: Patient Declined Electronic Signature(s) Signed: 01/23/2023 12:44:31 PM By: Redmond Pulling RN, BSN Entered By: Redmond Pulling on 01/23/2023 11:28:41 [name] ([id number]) [id number] Page 2 of 7 -------------------------------------------------------------------------------- Lower Extremity Assessment Details Patient Name: Date of Service: [name], [name] 01/23/2023 8:00 A M Medical Record Number: [id number] Patient Account Number: [id number] Date of Birth/Sex: Treating RN: Nov 24, 1965 (57 y.o. [name] Primary Care [name]: [name] Other Clinician: Referring [name]: [name] [name]/Extender: [name] in Treatment: 4 Edema Assessment Assessed: [name]: No] [Right: No] [Left: Edema] [Right: :] Calf Left: Right: Point of Measurement: 32 cm From Medial Instep 36 cm Ankle Left: Right: Point of Measurement: 13 cm From Medial Instep 20.5 cm Vascular Assessment Pulses: Dorsalis Pedis Palpable: [Left:Yes] Electronic Signature(s) Signed: 01/23/2023 12:44:31 PM By: Redmond Pulling RN, BSN Entered By: Redmond Pulling on 01/23/2023 08:11:41 -------------------------------------------------------------------------------- Multi Wound Chart Details Patient Name: Date of Service: [name], [name] [name] 01/23/2023 8:00 A M Medical Record Number: [id number] Patient Account Number: [id number] Date of Birth/Sex: Treating RN: [birth year] (57 y.o. M) Primary Care Din Bookwalter: [name] Other Clinician: Referring [name]: [name] [name]: [name] in Treatment: 4 Vital Signs Height(in): 68 Capillary Blood Glucose(mg/dl): 932 Weight(lbs): 355 Pulse(bpm): 69 Body Mass Index(BMI): 30.4 Blood Pressure(mmHg): 131/75 Temperature(F): 97.9 Respiratory Rate(breaths/min): 18 [1:Photos:] [N/A:N/A] Left Achilles N/A N/A Wound Location: Gradually Appeared N/A N/A Wounding Event: Diabetic Wound/Ulcer of the Lower N/A N/A Primary Etiology: Extremity Cataracts, Coronary Artery Disease, N/A N/A Comorbid History: Hypertension, Peripheral Venous Disease, Type I Diabetes, Rheumatoid Arthritis, Osteoarthritis 12/07/2022 N/A N/A Date Acquired: 4 N/A N/A Weeks of Treatment: Open N/A N/A Wound Status: No N/A N/A Wound Recurrence: 1x0.8x0.1 N/A N/A Measurements L x W x D (cm) 0.628 N/A N/A A (  cm) : rea 0.063 N/A N/A Volume (cm) : -24.90% N/A N/A % Reduction in A rea: -26.00% N/A N/A % Reduction in Volume: Unable to visualize wound bed N/A N/A Classification: Medium N/A N/A Exudate A mount: Serosanguineous N/A N/A Exudate Type: red, brown N/A N/A Exudate Color: Distinct, outline attached N/A N/A Wound Margin: None Present (0%) N/A N/A Granulation A mount: Large (67-100%) N/A N/A Necrotic A mount: Fat Layer (Subcutaneous Tissue): Yes N/A N/A Exposed Structures: Fascia: No Tendon: No Muscle: No Joint: No Bone: No None N/A N/A Epithelialization: Debridement - Selective/Open Wound N/A N/A Debridement: Pre-procedure Verification/Time Out 08:55 N/A N/A Taken: Lidocaine 4% Topical Solution N/A N/A Pain Control: Slough N/A N/A Tissue Debrided: Non-Viable Tissue N/A N/A Level: 0.63 N/A N/A Debridement A (sq cm): rea Blade, Forceps N/A N/A Instrument: Minimum N/A N/A Bleeding: Pressure N/A N/A Hemostasis A chieved: 0 N/A N/A Procedural Pain: 0 N/A N/A Post Procedural Pain: Procedure was tolerated well N/A N/A Debridement Treatment Response: 1x0.8x0.1 N/A N/A Post Debridement  Measurements L x W x D (cm) 0.063 N/A N/A Post Debridement Volume: (cm) Excoriation: No N/A N/A Periwound Skin Texture: Induration: No Callus: No Crepitus: No Rash: No Scarring: No Maceration: No N/A N/A Periwound Skin Moisture: Dry/Scaly: No Atrophie Blanche: No N/A N/A Periwound Skin Color: Cyanosis: No Ecchymosis: No Erythema: No Hemosiderin Staining: No Mottled: No Pallor: No Rubor: No Debridement N/A N/A Procedures Performed: Treatment Notes Electronic Signature(s) Signed: 01/23/2023 1:00:24 PM By: Geralyn Corwin DO Entered By: Geralyn Corwin on 01/23/2023 09:41:29 -------------------------------------------------------------------------------- Multi-Disciplinary Care Plan Details Patient Name: Date of Service: [name], [name] [name] 01/23/2023 8:00 A Wynn Banker, [name] ([id number]) [id number] Page 4 of 7 Medical Record Number: [id number] Patient Account Number: [id number] Date of Birth/Sex: Treating RN: 07-Jun-1966 (57 y.o. [name] Primary Care Alfonsa Vaile: [name] Other Clinician: Referring Santanna Whitford: [name] Daylan Boggess/Extender: [name] in Treatment: 4 Active Inactive Wound/Skin Impairment Nursing Diagnoses: Impaired tissue integrity Knowledge deficit related to smoking impact on wound healing Knowledge deficit related to ulceration/compromised skin integrity Goals: Patient will have a decrease in wound volume by X% from date: (specify in notes) Date Initiated: 12/25/2022 Target Resolution Date: 04/02/2023 Goal Status: Active Patient/caregiver will verbalize understanding of skin care regimen Date Initiated: 12/25/2022 Target Resolution Date: 02/05/2023 Goal Status: Active Ulcer/skin breakdown will have a volume reduction of 30% by week 4 Date Initiated: 12/25/2022 Target Resolution Date: 01/22/2023 Goal Status: Active Interventions: Assess patient/caregiver ability to obtain necessary  supplies Assess patient/caregiver ability to perform ulcer/skin care regimen upon admission and as needed Assess ulceration(s) every visit Provide education on smoking Provide education on ulcer and skin care Notes: Electronic Signature(s) Signed: 01/23/2023 12:44:31 PM By: Redmond Pulling RN, BSN Entered By: Redmond Pulling on 01/23/2023 08:35:23 -------------------------------------------------------------------------------- Pain Assessment Details Patient Name: Date of Service: [name], [name] [name] 01/23/2023 8:00 A M Medical Record Number: [id number] Patient Account Number: [id number] Date of Birth/Sex: Treating RN: 02-Sep-1965 (57 y.o. [name] Primary Care Garrick Midgley: [name] Other Clinician: Referring Amyla Heffner: [name] Donnavin Vandenbrink/Extender: [name] in Treatment: 4 Active Problems Location of Pain Severity and Description of Pain Patient Has Paino No Site Locations [name], [name] ([id number]) [id number] Page 5 of 7 Pain Management and Medication Current Pain Management: Electronic Signature(s) Signed: 01/23/2023 12:44:31 PM By: Redmond Pulling RN, BSN Entered By: Redmond Pulling on 01/23/2023 08:10:44 -------------------------------------------------------------------------------- Patient/Caregiver Education Details Patient Name: Date of Service: [place], [name] [name] 6/21/2024andnbsp8:00 A M Medical Record Number: [id number] Patient  Account Number: [id number] Date of Birth/Gender: Treating RN: 05-Apr-1966 (57 y.o. [name] Primary Care Physician: [name] Other Clinician: Referring Physician: Treating Physician/Extender: [name] in Treatment: 4 Education Assessment Education Provided To: Patient Education Topics Provided Wound/Skin Impairment: Methods: Explain/Verbal Responses: State content correctly Electronic Signature(s) Signed: 01/23/2023 12:44:31  PM By: Redmond Pulling RN, BSN Entered By: Redmond Pulling on 01/23/2023 08:35:41 -------------------------------------------------------------------------------- Wound Assessment Details Patient Name: Date of Service: [name], [name] [name] 01/23/2023 8:00 A M Medical Record Number: [id number] Patient Account Number: [id number] Date of Birth/Sex: Treating RN: 07-18-1966 (57 y.o. [name] Primary Care Bruce Churilla: [name] Other Clinician: Referring Shealee Yordy: [name] Janett Kamath/Extender: Laken, Lobato ([id number]) [id number] Page 6 of 7 Weeks in Treatment: 4 Wound Status Wound Number: 1 Primary Diabetic Wound/Ulcer of the Lower Extremity Etiology: Wound Location: Left Achilles Wound Open Wounding Event: Gradually Appeared Status: Date Acquired: 12/07/2022 Comorbid Cataracts, Coronary Artery Disease, Hypertension, Peripheral Weeks Of Treatment: 4 History: Venous Disease, Type I Diabetes, Rheumatoid Arthritis, Clustered Wound: No Osteoarthritis Photos Wound Measurements Length: (cm) 1 Width: (cm) 0.8 Depth: (cm) 0.1 Area: (cm) 0.628 Volume: (cm) 0.063 % Reduction in Area: -24.9% % Reduction in Volume: -26% Epithelialization: None Tunneling: No Undermining: No Wound Description Classification: Unable to visualize wound bed Wound Margin: Distinct, outline attached Exudate Amount: Medium Exudate Type: Serosanguineous Exudate Color: red, brown Foul Odor After Cleansing: No Slough/Fibrino Yes Wound Bed Granulation Amount: None Present (0%) Exposed Structure Necrotic Amount: Large (67-100%) Fascia Exposed: No Necrotic Quality: Adherent Slough Fat Layer (Subcutaneous Tissue) Exposed: Yes Tendon Exposed: No Muscle Exposed: No Joint Exposed: No Bone Exposed: No Periwound Skin Texture Texture Color No Abnormalities Noted: No No Abnormalities Noted: No Callus: No Atrophie Blanche: No Crepitus:  No Cyanosis: No Excoriation: No Ecchymosis: No Induration: No Erythema: No Rash: No Hemosiderin Staining: No Scarring: No Mottled: No Pallor: No Moisture Rubor: No No Abnormalities Noted: No Dry / Scaly: No Maceration: No Treatment Notes Wound #1 (Achilles) Wound Laterality: Left Cleanser Soap and Water Discharge Instruction: May shower and wash wound with dial antibacterial soap and water prior to dressing change. Vashe 5.8 (oz) Discharge Instruction: Cleanse the wound with Vashe prior to applying a clean dressing using gauze sponges, not tissue or cotton balls. Peri-Wound Care [name], [name] ([id number]) [id number] Page 7 of 7 Skin Prep Discharge Instruction: Use skin prep as directed Topical Primary Dressing Santyl Ointment Discharge Instruction: Apply nickel thick amount to wound bed as instructed Secondary Dressing Zetuvit Plus Silicone Border Dressing 4x4 (in/in) Discharge Instruction: Apply silicone border over primary dressing as directed. Secured With Compression Wrap Compression Stockings Facilities manager) Signed: 01/23/2023 12:44:31 PM By: Redmond Pulling RN, BSN Entered By: Redmond Pulling on 01/23/2023 08:15:13 -------------------------------------------------------------------------------- Vitals Details Patient Name: Date of Service: [name], [name] [name] 01/23/2023 8:00 A M Medical Record Number: [id number] Patient Account Number: [id number] Date of Birth/Sex: Treating RN: 1965-12-28 (57 y.o. [name] Primary Care Cyera Balboni: [name] Other Clinician: Referring Gianna Calef: [name] Danna Sewell/Extender: [name] in Treatment: 4 Vital Signs Time Taken: 08:06 Temperature (F): 97.9 Height (in): 68 Pulse (bpm): 69 Weight (lbs): 200 Respiratory Rate (breaths/min): 18 Body Mass Index (BMI): 30.4 Blood Pressure (mmHg): 131/75 Capillary Blood Glucose (mg/dl):  629 Reference Range: 80 - 120 mg / dl Electronic Signature(s) Signed: 01/23/2023 12:44:31 PM By: Redmond Pulling RN, BSN Entered By: Redmond Pulling on 01/23/2023 08:10:36

## 2023-01-26 LAB — GLUCOSE, CAPILLARY: Glucose-Capillary: 31 mg/dL — CL (ref 70–99)

## 2023-01-27 ENCOUNTER — Encounter (HOSPITAL_BASED_OUTPATIENT_CLINIC_OR_DEPARTMENT_OTHER): Payer: 59 | Admitting: Internal Medicine

## 2023-01-27 ENCOUNTER — Encounter: Payer: 59 | Admitting: Vascular Surgery

## 2023-01-27 DIAGNOSIS — Z87891 Personal history of nicotine dependence: Secondary | ICD-10-CM | POA: Diagnosis not present

## 2023-01-27 DIAGNOSIS — S91302A Unspecified open wound, left foot, initial encounter: Secondary | ICD-10-CM | POA: Diagnosis not present

## 2023-01-27 DIAGNOSIS — J449 Chronic obstructive pulmonary disease, unspecified: Secondary | ICD-10-CM | POA: Diagnosis not present

## 2023-01-27 DIAGNOSIS — E10621 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: Secondary | ICD-10-CM | POA: Diagnosis not present

## 2023-01-27 DIAGNOSIS — L97528 Non-pressure chronic ulcer of other part of left foot with other specified severity: Secondary | ICD-10-CM | POA: Diagnosis not present

## 2023-01-27 NOTE — Progress Notes (Signed)
MAKENZIE, VITTORIO ([id number]) [id number] Page 1 of 8 Visit Report for 01/27/2023 Arrival Information Details Patient Name: Date of Service: [place], [name] 01/27/2023 8:00 A M Medical Record Number: [id number] Patient Account Number: [id number] Date of Birth/Sex: Treating RN: 1966/01/10 (57 y.o. [name], [name] Primary Care Rayjon Wery: [name] Other Clinician: Referring Keiri Solano: Treating Zaryan Yakubov/Extender: Donnetta Hail in Treatment: 4 Visit Information History Since Last Visit Added or deleted any medications: No Patient Arrived: Ambulatory Any new allergies or adverse reactions: No Arrival Time: 07:56 Had a fall or experienced change in No Accompanied By: self activities of daily living that may affect Transfer Assistance: None risk of falls: Patient Identification Verified: Yes Signs or symptoms of abuse/neglect since last visito No Secondary Verification Process Completed: Yes Hospitalized since last visit: No Patient Requires Transmission-Based Precautions: No Implantable device outside of the clinic excluding No Patient Has Alerts: No cellular tissue based products placed in the center since last visit: Has Dressing in Place as Prescribed: Yes Pain Present Now: No Electronic Signature(s) Signed: 01/27/2023 4:53:01 PM By: Shawn Stall RN, BSN Entered By: Shawn Stall on 01/27/2023 07:56:20 -------------------------------------------------------------------------------- Encounter Discharge Information Details Patient Name: Date of Service: [name], [name] 01/27/2023 8:00 A M Medical Record Number: [id number] Patient Account Number: [id number] Date of Birth/Sex: Treating RN: September 08, 1965 (57 y.o. [name] Primary Care Micai Apolinar: [name] Other Clinician: Referring Krithik Mapel: Treating Meri Pelot/Extender: Donnetta Hail in Treatment: 4 Encounter Discharge Information  Items Post Procedure Vitals Discharge Condition: Stable Temperature (F): 97.8 Ambulatory Status: Ambulatory Pulse (bpm): 64 Discharge Destination: Home Respiratory Rate (breaths/min): 20 Transportation: Private Auto Blood Pressure (mmHg): 112/71 Accompanied By: self Schedule Follow-up Appointment: Yes Clinical Summary of Care: Electronic Signature(s) Signed: 01/27/2023 4:53:01 PM By: Shawn Stall RN, BSN Entered By: Shawn Stall on 01/27/2023 08:57:29 Deem, [name] ([id number]) [id number] Page 2 of 8 -------------------------------------------------------------------------------- Lower Extremity Assessment Details Patient Name: Date of Service: [place], [name] 01/27/2023 8:00 A M Medical Record Number: [id number] Patient Account Number: [id number] Date of Birth/Sex: Treating RN: 27-Sep-1965 (57 y.o. [name] Primary Care Briseida Gittings: [name] Other Clinician: Referring Teigan Manner: Treating Roni Friberg/Extender: Donnetta Hail in Treatment: 4 Edema Assessment Assessed: [name]: Yes] Franne Forts: No] Edema: [Left: N] [Right: o] Calf Left: Right: Point of Measurement: 32 cm From Medial Instep 36 cm Ankle Left: Right: Point of Measurement: 13 cm From Medial Instep 20 cm Vascular Assessment Pulses: Dorsalis Pedis Palpable: [Left:Yes] Posterior Tibial Palpable: [Left:No] Electronic Signature(s) Signed: 01/27/2023 4:53:01 PM By: Shawn Stall RN, BSN Entered By: Shawn Stall on 01/27/2023 07:59:59 -------------------------------------------------------------------------------- Multi Wound Chart Details Patient Name: Date of Service: [name], [name] 01/27/2023 8:00 A M Medical Record Number: [id number] Patient Account Number: [id number] Date of Birth/Sex: Treating RN: Aug 22, 1965 (57 y.o. 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: Extremity Cataracts, Coronary Artery Disease, N/A N/A Comorbid History: Hypertension, Peripheral Venous Disease, Type I Diabetes, Rheumatoid Arthritis, Osteoarthritis 12/07/2022 N/A N/A Date Acquired: 4 N/A N/A Weeks of Treatment: Open N/A N/A Wound Status: No N/A N/A Wound Recurrence: 0.6x0.6x0.3 N/A N/A Measurements L x W x D (cm) 0.283  N/A N/A A (cm) : rea 0.085 N/A N/A Volume (cm) : 43.70% N/A N/A % Reduction in A rea: -70.00% N/A N/A % Reduction in Volume: Grade 2 N/A N/A Classification: Medium N/A N/A Exudate A mount: Serosanguineous N/A N/A Exudate Type: red, brown N/A N/A Exudate Color: Epibole N/A N/A Wound Margin: None Present (0%) N/A N/A Granulation A mount: Large (67-100%) N/A N/A Necrotic A mount: Fat Layer (Subcutaneous Tissue): Yes N/A N/A Exposed Structures: Fascia: No Tendon: No Muscle: No Joint: No Bone: No None N/A N/A Epithelialization: Debridement - Selective/Open Wound N/A N/A Debridement: Pre-procedure Verification/Time Out 08:53 N/A N/A Taken: Lidocaine 4% Topical Solution N/A N/A Pain Control: Slough N/A N/A Tissue Debrided: Non-Viable Tissue N/A N/A Level: 0.28 N/A N/A Debridement A (sq cm): rea Curette N/A N/A Instrument: Minimum N/A N/A Bleeding: Pressure N/A N/A Hemostasis A chieved: 0 N/A N/A Procedural Pain: 0 N/A N/A Post Procedural Pain: Procedure was tolerated well N/A N/A Debridement Treatment Response: 0.6x0.6x0.3 N/A N/A Post Debridement Measurements L x W x D (cm) 0.085 N/A  N/A Post Debridement Volume: (cm) Excoriation: No N/A N/A Periwound Skin Texture: Induration: No Callus: No Crepitus: No Rash: No Scarring: No Maceration: No N/A N/A Periwound Skin Moisture: Dry/Scaly: No Atrophie Blanche: No N/A N/A Periwound Skin Color: Cyanosis: No Ecchymosis: No Erythema: No Hemosiderin Staining: No Mottled: No Pallor: No Rubor: No Debridement N/A N/A Procedures Performed: Treatment Notes Wound #1 (Achilles) Wound Laterality: Left Cleanser Soap and Water Discharge Instruction: May shower and wash wound with dial antibacterial soap and water prior to dressing change. Vashe 5.8 (oz) Discharge Instruction: Cleanse the wound with Vashe prior to applying a clean dressing using gauze sponges, not tissue or cotton balls. Peri-Wound Care Skin Prep Discharge Instruction: Use skin prep as directed Topical KEETON, KASSEBAUM ([id number]) [phone].pdf Page 4 of 8 Primary Dressing Santyl Ointment Discharge Instruction: Apply nickel thick amount to wound bed as instructed Secondary Dressing Zetuvit Plus Silicone Border Dressing 4x4 (in/in) Discharge Instruction: Apply silicone border over primary dressing as directed. Secured With Compression Wrap Compression Stockings Facilities manager) Signed: 01/27/2023 4:25:00 PM By: Geralyn Corwin DO Entered By: Geralyn Corwin on 01/27/2023 09:21:12 -------------------------------------------------------------------------------- Multi-Disciplinary Care Plan Details Patient Name: Date of Service: [name], [name] 01/27/2023 8:00 A M Medical Record Number: [id number] Patient Account Number: [id number] Date of Birth/Sex: Treating RN: 08/15/1965 (57 y.o. [name] Primary Care Lamontae Ricardo: [name] Other Clinician: Referring Teddie Curd: Treating Tanara Turvey/Extender: Donnetta Hail in Treatment: 4 Active Inactive Wound/Skin Impairment Nursing  Diagnoses: Impaired tissue integrity Knowledge deficit related to smoking impact on wound healing Knowledge deficit related to ulceration/compromised skin integrity Goals: Patient will have a decrease in wound volume by X% from date: (specify in notes) Date Initiated: 12/25/2022 Target Resolution Date: 04/02/2023 Goal Status: Active Patient/caregiver will verbalize understanding of skin care regimen Date Initiated: 12/25/2022 Target Resolution Date: 02/05/2023 Goal Status: Active Ulcer/skin breakdown will have a volume reduction of 30% by week 4 Date Initiated: 12/25/2022 Date Inactivated: 01/27/2023 Target Resolution Date: 01/22/2023 Unmet Reason: no major changes; Goal Status: Unmet continue current treatment. Interventions: Assess patient/caregiver ability to obtain necessary supplies Assess patient/caregiver ability to perform ulcer/skin care regimen upon admission and as needed Assess ulceration(s) every visit Provide education on smoking Provide education on ulcer and skin care Notes: Electronic Signature(s) Signed: 01/27/2023 4:53:01 PM By: Shawn Stall RN, BSN Entered By: Shawn Stall on 01/27/2023 08:08:51 [name] ([id number]) [id number] Page 5 of 8 -------------------------------------------------------------------------------- Pain Assessment Details Patient  Name: Date of Service: [place], [name] 01/27/2023 8:00 A M Medical Record Number: [id number] Patient Account Number: [id number] Date of Birth/Sex: Treating RN: 1966-03-15 (57 y.o. [name] Primary Care Nicolas Sisler: [name] Other Clinician: Referring Benedetta Sundstrom: Treating Elizabet Schweppe/Extender: Donnetta Hail in Treatment: 4 Active Problems Location of Pain Severity and Description of Pain Patient Has Paino No Site Locations Pain Management and Medication Current Pain Management: Electronic Signature(s) Signed: 01/27/2023 4:53:01 PM By:  Shawn Stall RN, BSN Entered By: Shawn Stall on 01/27/2023 07:56:31 -------------------------------------------------------------------------------- Patient/Caregiver Education Details Patient Name: Date of Service: [place], [name] 6/25/2024andnbsp8:00 A M Medical Record Number: [id number] Patient Account Number: [id number] Date of Birth/Gender: Treating RN: Jun 16, 1966 (57 y.o. [name] Primary Care Physician: [name] Other Clinician: Referring Physician: Treating Physician/Extender: Donnetta Hail in Treatment: 4 Education Assessment Education Provided To: Patient Education Topics Provided Wound/Skin Impairment: Handouts: Caring for Your Ulcer Methods: Explain/Verbal WHITTEN, ANDREONI ([id number]) [phone].pdf Page 6 of 8 Responses: Reinforcements needed Electronic Signature(s) Signed: 01/27/2023 4:53:01 PM By: Shawn Stall RN, BSN Entered By: Shawn Stall on 01/27/2023 08:09:01 -------------------------------------------------------------------------------- Wound Assessment Details Patient Name: Date of Service: [place], [name] 01/27/2023 8:00 A M Medical Record Number: [id number] Patient Account Number: [id number] Date of Birth/Sex: Treating RN: Aug 20, 1965 (57 y.o. [name], [name] Primary Care Jimmye Wisnieski: [name] Other Clinician: Referring Machi Whittaker: Treating Jatavion Peaster/Extender: Donnetta Hail in Treatment: 4 Wound Status Wound Number: 1 Primary Diabetic Wound/Ulcer of the Lower Extremity Etiology: Wound Location: Left Achilles Secondary Arterial Insufficiency Ulcer Wounding Event: Gradually Appeared Etiology: Date Acquired: 12/07/2022 Wound Open Weeks Of Treatment: 4 Status: Clustered Wound: No Comorbid Cataracts, Coronary Artery Disease, Hypertension, Peripheral History: Venous Disease, Type I Diabetes, Rheumatoid Arthritis, Osteoarthritis Photos Wound  Measurements Length: (cm) 0.6 Width: (cm) 0.6 Depth: (cm) 0.3 Area: (cm) 0.283 Volume: (cm) 0.085 % Reduction in Area: 43.7% % Reduction in Volume: -70% Epithelialization: None Tunneling: No Undermining: No Wound Description Classification: Grade 2 Wound Margin: Epibole Exudate Amount: Medium Exudate Type: Serosanguineous Exudate Color: red, brown Foul Odor After Cleansing: No Slough/Fibrino Yes Wound Bed Granulation Amount: None Present (0%) Exposed Structure Necrotic Amount: Large (67-100%) Fascia Exposed: No Necrotic Quality: Adherent Slough Fat Layer (Subcutaneous Tissue) Exposed: Yes Tendon Exposed: No Muscle Exposed: No Joint Exposed: No Bone Exposed: No [address] TREVELL, PARISEAU ([id number]) [id number] Page 7 of 8 No Abnormalities Noted: No No Abnormalities Noted: No Callus: No Atrophie Blanche: No Crepitus: No Cyanosis: No Excoriation: No Ecchymosis: No Induration: No Erythema: No Rash: No Hemosiderin Staining: No Scarring: No Mottled: No Pallor: No Moisture Rubor: No No Abnormalities Noted: No Dry / Scaly: No Maceration: No Treatment Notes Wound #1 (Achilles) Wound Laterality: Left Cleanser Soap and Water Discharge Instruction: May shower and wash wound with dial antibacterial soap and water prior to dressing change. Vashe 5.8 (oz) Discharge Instruction: Cleanse the wound with Vashe prior to applying a clean dressing using gauze sponges, not tissue or cotton balls. Peri-Wound Care Skin Prep Discharge Instruction: Use skin prep as directed Topical Primary Dressing Santyl Ointment Discharge Instruction: Apply nickel thick amount to wound bed as instructed Secondary Dressing Zetuvit Plus Silicone Border Dressing 4x4 (in/in) Discharge Instruction: Apply silicone border over primary dressing as directed. Secured With Compression Wrap Compression Stockings Geologist, engineering) Signed: 01/27/2023 4:25:00 PM By: Geralyn Corwin DO Signed: 01/27/2023 4:53:01 PM By: Shawn Stall RN, BSN Entered By: Geralyn Corwin on 01/27/2023 09:42:30 -------------------------------------------------------------------------------- Vitals  Details Patient Name: Date of Service: [place], [name] 01/27/2023 8:00 A M Medical Record Number: [id number] Patient Account Number: [id number] Date of Birth/Sex: Treating RN: 24-Aug-1965 (57 y.o. [name] Primary Care Brailyn Delman: [name] Other Clinician: Referring Rainn Bullinger: Treating Voris Tigert/Extender: Donnetta Hail in Treatment: 4 Vital Signs Time Taken: 08:00 Temperature (F): 97.8 Height (in): 68 Pulse (bpm): 64 Weight (lbs): 200 Respiratory Rate (breaths/min): 16 Body Mass Index (BMI): 30.4 Blood Pressure (mmHg): 112/71 Capillary Blood Glucose (mg/dl): 469 Reference Range: 80 - 120 mg / dl ABDOU, STOCKS ([id number]) [id number] Page 8 of 8 Notes HgbA1c 6.7 Electronic Signature(s) Signed: 01/27/2023 4:53:01 PM By: Shawn Stall RN, BSN Entered By: Shawn Stall on 01/27/2023 08:01:42

## 2023-01-28 ENCOUNTER — Ambulatory Visit
Admission: RE | Admit: 2023-01-28 | Discharge: 2023-01-28 | Disposition: A | Discharge status: Home / Self Care | Payer: 59 | Source: Ambulatory Visit | Attending: Interventional Radiology | Admitting: Interventional Radiology

## 2023-01-28 DIAGNOSIS — I739 Peripheral vascular disease, unspecified: Secondary | ICD-10-CM

## 2023-02-02 ENCOUNTER — Ambulatory Visit (HOSPITAL_COMMUNITY)
Admission: RE | Admit: 2023-02-02 | Discharge: 2023-02-02 | Disposition: A | Discharge status: Home / Self Care | Payer: 59 | Source: Ambulatory Visit | Attending: Internal Medicine | Admitting: Internal Medicine

## 2023-02-02 ENCOUNTER — Encounter (HOSPITAL_BASED_OUTPATIENT_CLINIC_OR_DEPARTMENT_OTHER): Payer: 59 | Attending: Internal Medicine | Admitting: Internal Medicine

## 2023-02-02 ENCOUNTER — Other Ambulatory Visit (HOSPITAL_COMMUNITY): Payer: Self-pay | Admitting: Internal Medicine

## 2023-02-02 DIAGNOSIS — Z87891 Personal history of nicotine dependence: Secondary | ICD-10-CM | POA: Insufficient documentation

## 2023-02-02 DIAGNOSIS — E10621 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: Secondary | ICD-10-CM | POA: Diagnosis not present

## 2023-02-02 DIAGNOSIS — J449 Chronic obstructive pulmonary disease, unspecified: Secondary | ICD-10-CM | POA: Insufficient documentation

## 2023-02-02 DIAGNOSIS — S91302A Unspecified open wound, left foot, initial encounter: Secondary | ICD-10-CM | POA: Diagnosis not present

## 2023-02-02 DIAGNOSIS — Z9289 Personal history of other medical treatment: Secondary | ICD-10-CM | POA: Insufficient documentation

## 2023-02-02 DIAGNOSIS — M069 Rheumatoid arthritis, unspecified: Secondary | ICD-10-CM | POA: Insufficient documentation

## 2023-02-02 DIAGNOSIS — L97528 Non-pressure chronic ulcer of other part of left foot with other specified severity: Secondary | ICD-10-CM | POA: Diagnosis not present

## 2023-02-02 DIAGNOSIS — E1051 Type 1 diabetes mellitus with diabetic peripheral angiopathy without gangrene: Secondary | ICD-10-CM | POA: Insufficient documentation

## 2023-02-02 DIAGNOSIS — X58XXXA Exposure to other specified factors, initial encounter: Secondary | ICD-10-CM | POA: Diagnosis not present

## 2023-02-02 DIAGNOSIS — I70245 Atherosclerosis of native arteries of left leg with ulceration of other part of foot: Secondary | ICD-10-CM | POA: Insufficient documentation

## 2023-02-02 DIAGNOSIS — Z794 Long term (current) use of insulin: Secondary | ICD-10-CM | POA: Diagnosis not present

## 2023-02-02 NOTE — Progress Notes (Signed)
[name], [name] ([id number]) [id number] Page 1 of 7 Visit Report for 02/02/2023 Arrival Information Details Patient Name: Date of Service: [name], [name] 02/02/2023 12:30 PM Medical Record Number: [id number] Patient Account Number: [id number] Date of Birth/Sex: Treating RN: August 01, 1966 (57 y.o. [name], [name].Loa Primary Care Effa Yarrow: [name] Other Clinician: Referring Anistyn Graddy: Treating Bodie Abernethy/Extender: Donnetta Hail in Treatment: 5 Visit Information History Since Last Visit Added or deleted any medications: No Patient Arrived: Ambulatory Any new allergies or adverse reactions: No Arrival Time: 12:36 Had a fall or experienced change in No Accompanied By: self activities of daily living that may affect Transfer Assistance: None risk of falls: Patient Identification Verified: Yes Signs or symptoms of abuse/neglect since last visito No Secondary Verification Process Completed: Yes Hospitalized since last visit: No Patient Requires Transmission-Based Precautions: No Implantable device outside of the clinic excluding No Patient Has Alerts: No cellular tissue based products placed in the center since last visit: Has Dressing in Place as Prescribed: Yes Pain Present Now: No Electronic Signature(s) Signed: 02/02/2023 4:45:04 PM By: Shawn Stall RN, BSN Entered By: Shawn Stall on 02/02/2023 12:36:52 -------------------------------------------------------------------------------- Encounter Discharge Information Details Patient Name: Date of Service: [name] Pont, [name] 02/02/2023 12:30 PM Medical Record Number: [id number] Patient Account Number: [id number] Date of Birth/Sex: Treating RN: [birth year] (57 y.o. [name] Primary Care Tra Wilemon: [name] Other Clinician: Referring [name]: Treating [name]/Extender: Donnetta Hail in Treatment: 5 Encounter Discharge Information Items  Post Procedure Vitals Discharge Condition: Stable Temperature (F): 98.2 Ambulatory Status: Ambulatory Pulse (bpm): 69 Discharge Destination: Home Respiratory Rate (breaths/min): 20 Transportation: Private Auto Blood Pressure (mmHg): 132/75 Accompanied By: self Schedule Follow-up Appointment: Yes Clinical Summary of Care: Electronic Signature(s) Signed: 02/02/2023 4:45:04 PM By: Shawn Stall RN, BSN Entered By: Shawn Stall on 02/02/2023 13:02:13 [name] ([id number]) [id number] Page 2 of 7 -------------------------------------------------------------------------------- Lower Extremity Assessment Details Patient Name: Date of Service: CRIGER, [name] 02/02/2023 12:30 PM Medical Record Number: [id number] Patient Account Number: [id number] Date of Birth/Sex: Treating RN: 05/30/1966 (57 y.o. [name] Primary Care [name]: [name] Other Clinician: Referring Dashiel Bergquist: Treating Lang Zingg/Extender: Donnetta Hail in Treatment: 5 Edema Assessment Assessed: [name]: No] [Right: No] Edema: [Left: N] [Right: o] Calf Left: Right: Point of Measurement: 32 cm From Medial Instep 36 cm Ankle Left: Right: Point of Measurement: 13 cm From Medial Instep 20 cm Vascular Assessment Pulses: Dorsalis Pedis Palpable: [Left:Yes] Posterior Tibial Palpable: [Left:Yes] Electronic Signature(s) Signed: 02/02/2023 4:45:04 PM By: Shawn Stall RN, BSN Entered By: Shawn Stall on 02/02/2023 12:39:41 -------------------------------------------------------------------------------- Multi Wound Chart Details Patient Name: Date of Service: [name] Pont, [name] 02/02/2023 12:30 PM Medical Record Number: [id number] Patient Account Number: [id number] Date of Birth/Sex: Treating RN: 1966-03-26 (57 y.o. 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 N/A N/A Secondary Etiology: Cataracts, Coronary Artery Disease, N/A N/A Comorbid History: Hypertension, Peripheral Venous Disease, Type I Diabetes, Rheumatoid Arthritis, Osteoarthritis 12/07/2022 N/A N/A Date Acquired: 5 N/A N/A Weeks of Treatment: Open N/A N/A Wound Status: No N/A N/A Wound Recurrence: 0.6x0.6x0.3 N/A N/A Measurements L x W x  D (cm) 0.283 N/A N/A A (cm) : rea 0.085 N/A N/A Volume (cm) : 43.70% N/A N/A % Reduction in A rea: -70.00% N/A N/A % Reduction in Volume: Grade 2 N/A N/A Classification: Medium N/A N/A Exudate A mount: Serosanguineous N/A N/A Exudate Type: red, brown N/A N/A Exudate Color: Epibole N/A N/A Wound Margin: None Present (0%) N/A N/A Granulation A mount: Large (67-100%) N/A N/A Necrotic A mount: Fat Layer (Subcutaneous Tissue): Yes N/A N/A Exposed Structures: Fascia: No Tendon: No Muscle: No Joint: No Bone: No None N/A N/A Epithelialization: Chemical/Enzymatic/Mechanical N/A N/A Debridement: N/A N/A N/A Instrument: None N/A N/A Bleeding: Debridement Treatment Response: Procedure was tolerated well N/A N/A Post Debridement Measurements L x 0.6x0.6x0.3 N/A N/A W x D (cm) 0.085 N/A N/A Post Debridement Volume: (cm) Excoriation: No N/A N/A Periwound Skin Texture: Induration: No Callus: No Crepitus: No Rash: No Scarring: No Maceration: No N/A N/A Periwound Skin Moisture: Dry/Scaly: No Atrophie Blanche: No N/A N/A Periwound Skin Color: Cyanosis: No Ecchymosis:  No Erythema: No Hemosiderin Staining: No Mottled: No Pallor: No Rubor: No Debridement N/A N/A Procedures Performed: Treatment Notes Wound #1 (Achilles) Wound Laterality: Left Cleanser Soap and Water Discharge Instruction: May shower and wash wound with dial antibacterial soap and water prior to dressing change. Vashe 5.8 (oz) Discharge Instruction: Cleanse the wound with Vashe prior to applying a clean dressing using gauze sponges, not tissue or cotton balls. Peri-Wound Care Skin Prep Discharge Instruction: Use skin prep as directed Topical Primary Dressing Santyl Ointment Discharge Instruction: Apply nickel thick amount to wound bed as instructed Secondary Dressing Zetuvit Plus Silicone Border Dressing 4x4 (in/in) Discharge Instruction: Apply silicone border over primary dressing as directed. AHNAF, GORMAN ([id number]) [id number] Page 4 of 7 Secured With Compression Wrap Compression Stockings Add-Ons Electronic Signature(s) Signed: 02/02/2023 2:53:49 PM By: Geralyn Corwin DO Entered By: Geralyn Corwin on 02/02/2023 13:10:22 -------------------------------------------------------------------------------- Multi-Disciplinary Care Plan Details Patient Name: Date of Service: [name], [name] [name] 02/02/2023 12:30 PM Medical Record Number: [id number] Patient Account Number: [id number] Date of Birth/Sex: Treating RN: 1966/07/09 (57 y.o. [name] Primary Care Fadia Marlar: [name] Other Clinician: Referring Hill Mackie: Treating Vernita Tague/Extender: Donnetta Hail in Treatment: 5 Active Inactive Wound/Skin Impairment Nursing Diagnoses: Impaired tissue integrity Knowledge deficit related to smoking impact on wound healing Knowledge deficit related to ulceration/compromised skin integrity Goals: Patient will have a decrease in wound volume by X% from date: (specify in notes) Date Initiated: 12/25/2022 Target  Resolution Date: 04/02/2023 Goal Status: Active Patient/caregiver will verbalize understanding of skin care regimen Date Initiated: 12/25/2022 Target Resolution Date: 04/03/2023 Goal Status: Active Ulcer/skin breakdown will have a volume reduction of 30% by week 4 Date Initiated: 12/25/2022 Date Inactivated: 01/27/2023 Target Resolution Date: 01/22/2023 Unmet Reason: no major changes; Goal Status: Unmet continue current treatment. Interventions: Assess patient/caregiver ability to obtain necessary supplies Assess patient/caregiver ability to perform ulcer/skin care regimen upon admission and as needed Assess ulceration(s) every visit Provide education on smoking Provide education on ulcer and skin care Notes: Electronic Signature(s) Signed: 02/02/2023 4:45:04 PM By: Shawn Stall RN, BSN Entered By: Shawn Stall on 02/02/2023 12:43:51 [name] ([id number]) [id number] Page 5 of 7 -------------------------------------------------------------------------------- Pain Assessment Details Patient Name: Date of Service: LEMP[place], [name] 02/02/2023 12:30 PM Medical Record Number: [id number] Patient Account Number: [id number] Date of Birth/Sex: Treating RN: 1965-10-22 (57 y.o. [name] Primary Care Brandilyn Nanninga: [name] Other Clinician: Referring Dominyck Reser: Treating Dai Apel/Extender: Donnetta Hail in Treatment: 5 Active Problems  Location of Pain Severity and Description of Pain Patient Has Paino No Site Locations Pain Management and Medication Current Pain Management: Electronic Signature(s) Signed: 02/02/2023 4:45:04 PM By: Shawn Stall RN, BSN Entered By: Shawn Stall on 02/02/2023 12:37:09 -------------------------------------------------------------------------------- Patient/Caregiver Education Details Patient Name: Date of Service: Jerelyn Scott 7/1/2024andnbsp12:30 PM Medical Record Number:  [id number] Patient Account Number: [id number] Date of Birth/Gender: Treating RN: 01-26-1966 (57 y.o. [name] Primary Care Physician: [name] Other Clinician: Referring Physician: Treating Physician/Extender: Donnetta Hail in Treatment: 5 Education Assessment Education Provided To: Patient Education Topics Provided Wound/Skin Impairment: Handouts: Caring for Your Ulcer Methods: Explain/Verbal Responses: Reinforcements needed Electronic Signature(s) Signed: 02/02/2023 4:45:04 PM By: Shawn Stall RN, BSN [name] ([id number]) [id number] Page 6 of 7 Entered By: Shawn Stall on 02/02/2023 12:44:08 -------------------------------------------------------------------------------- Wound Assessment Details Patient Name: Date of Service: AHMED, [name] 02/02/2023 12:30 PM Medical Record Number: [id number] Patient Account Number: [id number] Date of Birth/Sex: Treating RN: 04-Mar-1966 (57 y.o. [name], [name].Loa Primary Care Jazara Swiney: [name] Other Clinician: Referring Rosina Cressler: Treating Marilu Rylander/Extender: Donnetta Hail in Treatment: 5 Wound Status Wound Number: 1 Primary Diabetic Wound/Ulcer of the Lower Extremity Etiology: Wound Location: Left Achilles Secondary Arterial Insufficiency Ulcer Wounding Event: Gradually Appeared Etiology: Date Acquired: 12/07/2022 Wound Open Weeks Of Treatment: 5 Status: Clustered Wound: No Comorbid Cataracts, Coronary Artery Disease, Hypertension, Peripheral History: Venous Disease, Type I Diabetes, Rheumatoid Arthritis, Osteoarthritis Photos Wound Measurements Length: (cm) 0.6 Width: (cm) 0.6 Depth: (cm) 0.3 Area: (cm) 0.283 Volume: (cm) 0.085 % Reduction in Area: 43.7% % Reduction in Volume: -70% Epithelialization: None Tunneling: No Undermining: No Wound Description Classification: Grade 2 Wound Margin: Epibole Exudate Amount:  Medium Exudate Type: Serosanguineous Exudate Color: red, brown Foul Odor After Cleansing: No Slough/Fibrino Yes Wound Bed Granulation Amount: None Present (0%) Exposed Structure Necrotic Amount: Large (67-100%) Fascia Exposed: No Necrotic Quality: Adherent Slough Fat Layer (Subcutaneous Tissue) Exposed: Yes Tendon Exposed: No Muscle Exposed: No Joint Exposed: No Bone Exposed: No Periwound Skin Texture Texture Color No Abnormalities Noted: No No Abnormalities Noted: No Callus: No Atrophie Blanche: No Crepitus: No Cyanosis: No Excoriation: No Ecchymosis: No Induration: No Erythema: No Rash: No Hemosiderin Staining: No LINZY, LADEHOFF ([id number]) [id number] Page 7 of 7 Scarring: No Mottled: No Pallor: No Moisture Rubor: No No Abnormalities Noted: No Dry / Scaly: No Maceration: No Treatment Notes Wound #1 (Achilles) Wound Laterality: Left Cleanser Soap and Water Discharge Instruction: May shower and wash wound with dial antibacterial soap and water prior to dressing change. Vashe 5.8 (oz) Discharge Instruction: Cleanse the wound with Vashe prior to applying a clean dressing using gauze sponges, not tissue or cotton balls. Peri-Wound Care Skin Prep Discharge Instruction: Use skin prep as directed Topical Primary Dressing Santyl Ointment Discharge Instruction: Apply nickel thick amount to wound bed as instructed Secondary Dressing Zetuvit Plus Silicone Border Dressing 4x4 (in/in) Discharge Instruction: Apply silicone border over primary dressing as directed. Secured With Compression Wrap Compression Stockings Facilities manager) Signed: 02/02/2023 4:45:04 PM By: Shawn Stall RN, BSN Entered By: Shawn Stall on 02/02/2023 12:42:37 -------------------------------------------------------------------------------- Vitals Details Patient Name: Date of Service: [name] Pont, [name] 02/02/2023 12:30 PM Medical Record Number:  [id number] Patient Account Number: [id number] Date of Birth/Sex: Treating RN: 1966/01/20 (57 y.o. [name] Primary Care Vincie Linn: [name] Other Clinician: Referring Welles Walthall: Treating Ahren Pettinger/Extender: Donnetta Hail in Treatment: 5 Vital Signs Time Taken: 12:35 Temperature (F): 98.2 Height (in): 68  Pulse (bpm): 69 Weight (lbs): 200 Respiratory Rate (breaths/min): 20 Body Mass Index (BMI): 30.4 Blood Pressure (mmHg): 132/75 Capillary Blood Glucose (mg/dl): 096 Reference Range: 80 - 120 mg / dl Electronic Signature(s) Signed: 02/02/2023 4:45:04 PM By: Shawn Stall RN, BSN Entered By: Shawn Stall on 02/02/2023 12:37:05

## 2023-02-02 NOTE — Progress Notes (Addendum)
NIRAJ, KUDRNA ([id number]) [id number] Page 1 of 9 Visit Report for 12/25/2022 Chief Complaint Document Details Patient Name: Date of Service: [place], [name] 12/25/2022 8:00 A M Medical Record Number: [id number] Patient Account Number: [id number] Date of Birth/Sex: Treating RN: 24-Oct-1965 (57 y.o. M) Primary Care Provider: [name] Other Clinician: Referring Provider: Treating Provider/Extender: Donnetta Hail in Treatment: 0 Information Obtained from: Patient Chief Complaint 12/25/2022; wound to the left Achilles region Electronic Signature(s) Signed: 12/25/2022 12:56:08 PM By: Geralyn Corwin DO Entered By: Geralyn Corwin on 12/25/2022 09:45:09 -------------------------------------------------------------------------------- Debridement Details Patient Name: Date of Service: [name], [name] 12/25/2022 8:00 A M Medical Record Number: [id number] Patient Account Number: [id number] Date of Birth/Sex: Treating RN: [birth year] (57 y.o. [name] Primary Care Provider: [name] Other Clinician: Referring Provider: Treating Provider/Extender: Donnetta Hail in Treatment: 0 Debridement Performed for Assessment: Wound #1 Left Achilles Performed By: Clinician Redmond Pulling, RN Debridement Type: Chemical/Enzymatic/Mechanical Agent Used: wound cleanser and gauze Severity of Tissue Pre Debridement: Fat layer exposed Level of Consciousness (Pre-procedure): Awake and Alert Pre-procedure Verification/Time Out No Taken: Pain Control: Lidocaine 4% Topical Solution Percent of Wound Bed Debrided: Instrument: Other : gauze and wound cleanser Bleeding: None Response to Treatment: Procedure was tolerated well Level of Consciousness (Post- Awake and Alert procedure): Post Debridement Measurements of Total Wound Length: (cm) 0.8 Width: (cm) 0.8 Depth: (cm) 0.1 Volume: (cm)  0.05 Character of Wound/Ulcer Post Debridement: Improved Severity of Tissue Post Debridement: Fat layer exposed Post Procedure Diagnosis Same as Pre-procedure Electronic Signature(s) Signed: 12/25/2022 5:26:09 PM By: Redmond Pulling RN, BSN Woolridge, [name] ([id number]) [id number] Page 2 of 9 Signed: 12/26/2022 11:26:15 AM By: Geralyn Corwin DO Entered By: Redmond Pulling on 12/25/2022 16:53:03 -------------------------------------------------------------------------------- HPI Details Patient Name: Date of Service: [name], [name] 12/25/2022 8:00 A M Medical Record Number: [id number] Patient Account Number: [id number] Date of Birth/Sex: Treating RN: [birth year] (57 y.o. M) Primary Care Provider: [name] Other Clinician: Referring Provider: Treating Provider/Extender: Donnetta Hail in Treatment: 0 History of Present Illness HPI Description: 12/25/2022 Mr. Breyon Sigg is a 57 year old male with a past medical history of controlled insulin-dependent type 1 diabetes, COPD and rheumatoid arthritis that presents the clinic for a 1 month history of nonhealing ulcer to the left foot. On 03/2020 patient had surgery for left fifth metatarsal fracture and subsequently had hardware removal in March 2022 due to infection. Over the past year he has experienced pain to the lateral left foot however the incision site and wound has healed. 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 He follows with Dr. [name] for this issue. Due to pain to the lateral left foot he uses Hoka sneakers however this rubbed at the back of his heel creating a wound to the Achilles region. He has since changed his shoe wear to boots that come up above this area or crocs. He has been able to offload this area over the past couple weeks. He is currently on doxycycline, pentoxifylline,  nitroglycerin patch and cilostazol by Dr. [name] for his wound care. Currently patient denies signs of infection. ABI in office was 1.3 and he is scheduled for arterial duplex studies on 01/28/23. Electronic Signature(s) Signed: 12/25/2022 12:56:08 PM By: Geralyn Corwin DO Entered By: Geralyn Corwin on 12/25/2022 09:55:51 -------------------------------------------------------------------------------- Physical Exam Details Patient Name: Date of Service: [place], [name] 12/25/2022 8:00 A  M Medical Record Number: [id number] Patient Account Number: [id number] Date of Birth/Sex: Treating RN: 1966/07/31 (57 y.o. M) Primary Care Provider: [name] Other Clinician: Referring Provider: Treating Provider/Extender: Donnetta Hail in Treatment: 0 Constitutional respirations regular, non-labored and within target range for patient.. Cardiovascular 2+ dorsalis pedis/difficult to palpate posterior tibialis pulses. Psychiatric pleasant and cooperative. Notes T the Achilles region there is an open wound with nonviable tissue throughout no signs of infection including increased warmth, purulent drainage. o Electronic Signature(s) Signed: 03/03/2023 12:34:47 PM By: Geralyn Corwin DO Previous Signature: 01/01/2023 11:25:45 AM Version By: Geralyn Corwin DO Previous Signature: 12/25/2022 12:56:08 PM Version By: Geralyn Corwin DO Entered By: Geralyn Corwin on 03/03/2023 12:09:57 Tupy, [name] ([id number]) [id number] Page 3 of 9 -------------------------------------------------------------------------------- Physician Orders Details Patient Name: Date of Service: [place], [name] 12/25/2022 8:00 A M Medical Record Number: [id number] Patient Account Number: [id number] Date of Birth/Sex: Treating RN: Jan 16, 1966 (57 y.o. [name] Primary Care Provider: [name] Other Clinician: Referring Provider: Treating  Provider/Extender: Donnetta Hail in Treatment: 0 Verbal / Phone Orders: No Diagnosis Coding ICD-10 Coding Code Description [phone] Unspecified open wound, left foot, initial encounter L97.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 J44.9 Chronic obstructive pulmonary disease, unspecified M06.9 Rheumatoid arthritis, unspecified Follow-up Appointments ppointment in 1 week. - Dr. Mikey Bussing Return A Other: - Santyl being ordered, use medihoney for dressing changes until Santyl is received Anesthetic Wound #1 Left Achilles (In clinic) Topical Lidocaine 4% applied to wound bed Bathing/ Shower/ Hygiene May shower and wash wound with soap and water. - clean wound with Dial antibacterial soap, then apply new dressing Wound Treatment Wound #1 - Achilles Wound Laterality: Left Cleanser: Soap and Water 1 x Per Day/15 Days Discharge Instructions: May shower and wash wound with dial antibacterial soap and water prior to dressing change. Cleanser: Vashe 5.8 (oz) 1 x Per Day/15 Days Discharge Instructions: Cleanse the wound with Vashe prior to applying a clean dressing using gauze sponges, not tissue or cotton balls. Peri-Wound Care: Skin Prep (DME) (Generic) 1 x Per Day/15 Days Discharge Instructions: Use skin prep as directed Prim Dressing: Santyl Ointment 1 x Per Day/15 Days ary Discharge Instructions: Apply nickel thick amount to wound bed as instructed Secondary Dressing: Zetuvit Plus Silicone Border Dressing 4x4 (in/in) (DME) (Generic) 1 x Per Day/15 Days Discharge Instructions: Apply silicone border over primary dressing as directed. Consults Vascular - Refer to [facility] for ABI and arterial duplex due to risk for critical limb ischemia in a patient who has non healing wound to the left leg Patient Medications llergies: metoprolol, niacin, tramadol A Notifications Medication  Indication Start End 12/25/2022 lidocaine DOSE topical 4 % cream - cream topical once daily 12/25/2022 Santyl DOSE 1 - topical 250 unit/gram ointment - Apply once daily to the wound bed Electronic Signature(s) Signed: 12/25/2022 5:26:09 PM By: Redmond Pulling RN, BSN Signed: 12/26/2022 11:26:15 AM By: Geralyn Corwin DO Previous Signature: 12/25/2022 12:56:08 PM Version By: Zane Herald ([id number]) [id number] Page 4 of 9 Previous Signature: 12/25/2022 12:56:08 PM Version By: Geralyn Corwin DO Previous Signature: 12/25/2022 9:16:02 AM Version By: Geralyn Corwin DO Entered By: Redmond Pulling on 12/25/2022 16:54:07 Prescription 12/25/2022 -------------------------------------------------------------------------------- [name]. Geralyn Corwin DO Patient Name: Provider: 1965/10/28 [id number] Date of Birth: NPI#: Judie Petit [id number] Sex: DEA #: [phone] Phone #: License #: UPN: Patient Address: [address] [facility] [address] [place], [place] [address] [address] [place], [place] [address] [phone] Allergies metoprolol; niacin; tramadol Provider's Orders Vascular - Refer to [facility] for ABI and arterial duplex due to risk for critical limb ischemia in a patient who has non healing wound to the left leg Hand Signature: Date(s): Electronic Signature(s) Signed: 12/25/2022 5:26:09 PM By: Redmond Pulling RN, BSN Signed: 12/26/2022 11:26:15 AM By: Geralyn Corwin DO Previous Signature: 12/25/2022 12:56:08 PM Version By: Geralyn Corwin DO Entered By: Redmond Pulling on 12/25/2022 16:54:08 -------------------------------------------------------------------------------- Problem List Details Patient Name: Date of Service: [name], [name] 12/25/2022 8:00 A M Medical Record Number: [id number] Patient Account Number: [id number] Date of Birth/Sex: Treating RN: 1965-11-28 (57  y.o. M) Primary Care Provider: [name] Other Clinician: Referring Provider: Treating Provider/Extender: Donnetta Hail in Treatment: 0 Active Problems ICD-10 Encounter Code Description Active Date MDM Diagnosis S91.302A Unspecified open wound, left foot, initial encounter 12/25/2022 No Yes L97.528 Non-pressure chronic ulcer of other part of left foot with other specified 12/25/2022 No Yes severity 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 12/25/2022 No Yes BRAXLEY, BALANDRAN ([id number]) [id number] Page 5 of 9 J44.9 Chronic obstructive pulmonary disease, unspecified 12/25/2022 No Yes M06.9 Rheumatoid arthritis, unspecified 12/25/2022 No Yes Inactive Problems Resolved Problems Electronic Signature(s) Signed: 12/25/2022 12:56:08 PM By: Geralyn Corwin DO Entered By: Geralyn Corwin on 12/25/2022 09:44:31 -------------------------------------------------------------------------------- Progress Note Details Patient Name: Date of Service: [name], [name] 12/25/2022 8:00 A M Medical Record Number: [id number] Patient Account Number: [id number] Date of Birth/Sex: Treating RN: July 01, 1966 (57 y.o. M) Primary Care Provider: [name] Other Clinician: Referring Provider: Treating Provider/Extender: Donnetta Hail in Treatment: 0 Subjective Chief Complaint Information obtained from Patient 12/25/2022; wound to the left Achilles region History of Present Illness (HPI) 12/25/2022 Mr. Kenley Troop is a 57 year old male with a past medical history of controlled insulin-dependent type 1 diabetes, COPD and rheumatoid arthritis that presents the clinic for a 1 month history of nonhealing ulcer to the left foot. On 03/2020 patient had surgery for left fifth metatarsal fracture and subsequently had hardware removal in March 2022 due to infection. Over the past year he has experienced pain to the  lateral left foot however the incision site and wound has healed. 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 He follows with Dr. [name] for this issue. Due to pain to the lateral left foot he uses Hoka sneakers however this rubbed at the back of his heel creating a wound to the Achilles region. He has since changed his shoe wear to boots that come up above this area or crocs. He has been able to offload this area over the past couple weeks. He is currently on doxycycline, pentoxifylline, nitroglycerin patch and cilostazol by Dr. [name] for his wound care. Currently patient denies signs of infection. ABI in office was 1.3 and he is scheduled for arterial duplex studies on 01/28/23. Patient History Information obtained from Patient. Allergies metoprolol (Reaction: diarrhea), niacin, tramadol Family History Cancer - Father,Mother, Heart Disease - Father, Hypertension - Father, Seizures - Mother, No family history of Diabetes, Kidney Disease, Lung Disease, Stroke, Thyroid Problems, Tuberculosis. Social History Current every day smoker - 40+years, Marital Status - Divorced, Alcohol Use - Never, Drug Use - No History, Caffeine Use - Daily. Medical History Eyes Patient has history of Cataracts Cardiovascular Patient  has history of Coronary Artery Disease, Hypertension, Peripheral Venous Disease Endocrine Patient has history of Type I Diabetes Musculoskeletal Patient has history of Rheumatoid Arthritis, Osteoarthritis Patient is treated with Insulin. Blood sugar is tested. Hospitalization/Surgery History - internal fixation of left 5th toe fracture. - hardware removal 2022 left foot. - 2020 elbow drained. - 2019 excision oral tumor. LUKEN, SHADOWENS ([id number]) [id number] Page 6 of 9 2019 mouth surgery. - 2017 cardiac cath. 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 - carpal tunnel release. - cataract extraction,  bilateral. Medical A Surgical History Notes nd Eyes diabetic retinopathy Respiratory pleural effusion, bilateral Genitourinary CKDIII Musculoskeletal Rheumatoid nodulosis Osteopenia Review of Systems (ROS) Ear/Nose/Mouth/Throat Denies complaints or symptoms of Chronic sinus problems or rhinitis. Gastrointestinal Denies complaints or symptoms of Frequent diarrhea, Nausea, Vomiting. Integumentary (Skin) Complains or has symptoms of Wounds - left heel. Neurologic Denies complaints or symptoms of Numbness/parasthesias. Psychiatric Denies complaints or symptoms of Claustrophobia. Objective Constitutional respirations regular, non-labored and within target range for patient.. Vitals Time Taken: 8:10 AM, Height: 68 in, Source: Stated, Weight: 200 lbs, Source: Stated, BMI: 30.4, Temperature: 97.9 F, Pulse: 85 bpm, Respiratory Rate: 18 breaths/min, Blood Pressure: 134/74 mmHg, Capillary Blood Glucose: 93 mg/dl. Cardiovascular 2+ dorsalis pedis/difficult to palpate posterior tibialis pulses. Psychiatric pleasant and cooperative. General Notes: T the Achilles region there is an open wound with nonviable tissue throughout no signs of infection including increased warmth, purulent o drainage. Integumentary (Hair, Skin) Wound #1 status is Open. Original cause of wound was Gradually Appeared. The date acquired was: 12/07/2022. The wound is located on the Left Achilles. The wound measures 0.8cm length x 0.8cm width x 0.1cm depth; 0.503cm^2 area and 0.05cm^3 volume. There is Fat Layer (Subcutaneous Tissue) exposed. There is no tunneling or undermining noted. There is a medium amount of serosanguineous drainage noted. The wound margin is distinct with the outline attached to the wound base. There is no granulation within the wound bed. There is a large (67-100%) amount of necrotic tissue within the wound bed including Eschar. Assessment Active Problems ICD-10 Unspecified open wound, left foot,  initial encounter Non-pressure chronic ulcer of other part of left foot with other specified severity 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 Chronic obstructive pulmonary disease, unspecified Rheumatoid arthritis, unspecified Patient presents with a 1 month history of nonhealing ulcer to the left Achilles region caused by friction and nonhealing due to diabetes. He has good glycemic control. He is currently taking doxycycline and he can continue this. He has been using mupirocin ointment to the wound bed. I recommended at this time stopping the antibiotic ointment and starting Santyl to help with debridement. If he is unable to obtain this he can use Medihoney. It was explained that since he has nonviable tissue throughout the wound bed we do not know how deep this wound goes. ABIs in office were noncompressible however he does have strong pedal pulses. He is scheduled for arterial studies at the end of June and we will call to see if this can be moved up as he does have a wound that is nonhealing. I recommended continuing to aggressively offload the area. It seems like he has changed his footwear to help with this. Follow-up in 1 week. Procedures Wound #1 Pre-procedure diagnosis of Wound #1 is a Diabetic Wound/Ulcer of the Lower Extremity located on the Left Achilles .Severity of Tissue Pre Debridement is: Fat layer exposed. There was a Chemical/Enzymatic/Mechanical debridement performed by Redmond Pulling, RN. With the following instrument(s): gauze and Lieber, Kearney J (  [id number]) [id number] Page 7 of 9 wound cleanser after achieving pain control using Lidocaine 4% Topical Solution. Other agent used was wound cleanser and gauze. There was no bleeding. The procedure was tolerated well. Post Debridement Measurements: 0.8cm length x 0.8cm width x 0.1cm depth; 0.05cm^3 volume. Character of Wound/Ulcer Post Debridement is improved. Severity of Tissue Post Debridement  is: Fat layer exposed. Post procedure Diagnosis Wound #1: Same as Pre-Procedure Plan Follow-up Appointments: Return Appointment in 1 week. - Dr. Mikey Bussing Other: - Santyl being ordered, use medihoney for dressing changes until Santyl is received Anesthetic: Wound #1 Left Achilles: (In clinic) Topical Lidocaine 4% applied to wound bed Bathing/ Shower/ Hygiene: May shower and wash wound with soap and water. - clean wound with Dial antibacterial soap, then apply new dressing Consults ordered were: Vascular - Refer to [facility] for ABI and arterial duplex due to risk for critical limb ischemia in a patient who has non healing wound to the left leg The following medication(s) was prescribed: lidocaine topical 4 % cream cream topical once daily was prescribed at facility Santyl topical 250 unit/gram ointment 1 Apply once daily to the wound bed starting 12/25/2022 WOUND #1: - Achilles Wound Laterality: Left Cleanser: Soap and Water 1 x Per Day/15 Days Discharge Instructions: May shower and wash wound with dial antibacterial soap and water prior to dressing change. Cleanser: Vashe 5.8 (oz) 1 x Per Day/15 Days Discharge Instructions: Cleanse the wound with Vashe prior to applying a clean dressing using gauze sponges, not tissue or cotton balls. Peri-Wound Care: Skin Prep (DME) (Generic) 1 x Per Day/15 Days Discharge Instructions: Use skin prep as directed Prim Dressing: Santyl Ointment 1 x Per Day/15 Days ary Discharge Instructions: Apply nickel thick amount to wound bed as instructed Secondary Dressing: Zetuvit Plus Silicone Border Dressing 4x4 (in/in) (DME) (Generic) 1 x Per Day/15 Days Discharge Instructions: Apply silicone border over primary dressing as directed. 1. Santyl 2. Aggressive offloading 3. Follow-up arterial studies 4. Follow-up in 1 week Electronic Signature(s) Signed: 03/03/2023 12:34:47 PM By: Geralyn Corwin DO Previous Signature: 02/02/2023 2:43:36 PM Version By: Pearletha Alfred Previous Signature: 02/02/2023 2:57:08 PM Version By: Geralyn Corwin DO Previous Signature: 01/01/2023 9:31:56 AM Version By: Shawn Stall RN, BSN Previous Signature: 01/01/2023 11:25:45 AM Version By: Geralyn Corwin DO Previous Signature: 12/25/2022 12:56:08 PM Version By: Geralyn Corwin DO Entered By: Geralyn Corwin on 03/03/2023 12:11:03 -------------------------------------------------------------------------------- HxROS Details Patient Name: Date of Service: Ponti, [name] 12/25/2022 8:00 A M Medical Record Number: [id number] Patient Account Number: [id number] Date of Birth/Sex: Treating RN: 05/01/1966 (57 y.o. [name] Primary Care Provider: [name] Other Clinician: Referring Provider: Treating Provider/Extender: Donnetta Hail in Treatment: 0 Information Obtained From Patient Ear/Nose/Mouth/Throat Complaints and Symptoms: Negative for: Chronic sinus problems or rhinitis Gastrointestinal Complaints and Symptoms: DAMONDRE, PFEIFLE ([id number]) [id number] Page 8 of 9 Negative for: Frequent diarrhea; Nausea; Vomiting Integumentary (Skin) Complaints and Symptoms: Positive for: Wounds - left heel Neurologic Complaints and Symptoms: Negative for: Numbness/parasthesias Psychiatric Complaints and Symptoms: Negative for: Claustrophobia Eyes Medical History: Positive for: Cataracts Past Medical History Notes: diabetic retinopathy Hematologic/Lymphatic Respiratory Medical History: Past Medical History Notes: pleural effusion, bilateral Cardiovascular Medical History: Positive for: Coronary Artery Disease; Hypertension; Peripheral Venous Disease Endocrine Medical History: Positive for: Type I Diabetes Treated with: Insulin Blood sugar tested every day: Yes Tested : Genitourinary Medical History: Past Medical History Notes: CKDIII Immunological Musculoskeletal Medical  History: Positive for: Rheumatoid Arthritis; Osteoarthritis Past Medical History Notes: Rheumatoid nodulosis  Osteopenia Oncologic HBO Extended History Items Eyes: Cataracts Immunizations Pneumococcal Vaccine: Received Pneumococcal Vaccination: No Implantable Devices None Hospitalization / Surgery History Type of Hospitalization/Surgery SAURAV, CRUMBLE ([id number]) [id number] Page 9 of 9 internal fixation of left 5th toe fracture hardware removal 2022 left foot 2020 elbow drained 2019 excision oral tumor 2019 mouth surgery 2017 cardiac cath 2015 CABG carpal tunnel release cataract extraction, bilateral Family and Social History Cancer: Yes - Father,Mother; Diabetes: No; Heart Disease: Yes - Father; Hypertension: Yes - Father; Kidney Disease: No; Lung Disease: No; Seizures: Yes - Mother; Stroke: No; Thyroid Problems: No; Tuberculosis: No; Current every day smoker - 40+years; Marital Status - Divorced; Alcohol Use: Never; Drug Use: No History; Caffeine Use: Daily; Financial Concerns: No; Food, Clothing or Shelter Needs: No; Support System Lacking: No; Transportation Concerns: No Electronic Signature(s) Signed: 12/25/2022 12:56:08 PM By: Geralyn Corwin DO Signed: 12/25/2022 5:26:09 PM By: Redmond Pulling RN, BSN Entered By: Redmond Pulling on 12/25/2022 08:17:25 -------------------------------------------------------------------------------- SuperBill Details Patient Name: Date of Service: [name], [name] 12/25/2022 Medical Record Number: [id number] Patient Account Number: [id number] Date of Birth/Sex: Treating RN: November 20, 1965 (57 y.o. M) Primary Care Provider: [name] Other Clinician: Referring Provider: Treating Provider/Extender: Donnetta Hail in Treatment: 0 Diagnosis Coding ICD-10 Codes Code Description [phone] Unspecified open wound, left foot, initial encounter L97.528 Non-pressure chronic ulcer of  other part of left foot with other specified severity 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 J44.9 Chronic obstructive pulmonary disease, unspecified M06.9 Rheumatoid arthritis, unspecified Facility Procedures : CPT4 Code: [id number] Description: 9204 - WOUND CARE VISIT-LEV 4 NEW PT Modifier: Quantity: 1 Physician Procedures : CPT4 Code Description Modifier [id number] - WC PHYS LEVEL 4 - NEW PT ICD-10 Diagnosis Description S91.302A Unspecified open wound, left foot, initial encounter L97.528 Non-pressure chronic ulcer of other part of left foot with other specified  severity 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 J44.9 Chronic obstructive pulmonary disease, unspecified Quantity: 1 Electronic Signature(s) Signed: 12/25/2022 12:56:08 PM By: Geralyn Corwin DO Signed: 12/25/2022 5:26:09 PM By: Redmond Pulling RN, BSN Entered By: Redmond Pulling on 12/25/2022 10:17:02

## 2023-02-02 NOTE — Progress Notes (Signed)
ARVIE, ALSEPT ([id number]) [id number] Page 1 of 8 Visit Report for 01/01/2023 Chief Complaint Document Details Patient Name: Date of Service: [name], [name] 01/01/2023 7:45 A M Medical Record Number: [id number] Patient Account Number: [id number] Date of Birth/Sex: Treating RN: 06/03/1966 (57 y.o. M) Primary Care Provider: [name] Other Clinician: Referring Provider: Treating Provider/Extender: Donnetta Hail in Treatment: 1 Information Obtained from: Patient Chief Complaint 12/25/2022; wound to the left Achilles region Electronic Signature(s) Signed: 01/01/2023 11:25:14 AM By: Geralyn Corwin DO Entered By: Geralyn Corwin on 01/01/2023 09:46:03 -------------------------------------------------------------------------------- HPI Details Patient Name: Date of Service: [name], [name] [name] 01/01/2023 7:45 A M Medical Record Number: [id number] Patient Account Number: [id number] Date of Birth/Sex: Treating RN: [birth year] (57 y.o. M) Primary Care Provider: [name] Other Clinician: Referring Provider: Treating Provider/Extender: Donnetta Hail in Treatment: 1 History of Present Illness HPI Description: 12/25/2022 [name] is a 57 year old male with a past medical history of controlled insulin-dependent type 1 diabetes, COPD and rheumatoid arthritis that presents the clinic for a 1 month history of nonhealing ulcer to the left foot. On 03/2020 patient had surgery for left fifth metatarsal fracture and subsequently had hardware removal in March 2022 due to infection. Over the past year he has experienced pain to the lateral left foot however the incision site and wound has healed. 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 He follows with Dr. [name] for this issue. Due to pain to the lateral left foot he uses  Hoka sneakers however this rubbed at the back of his heel creating a wound to the Achilles region. He has since changed his shoe wear to boots that come up above this area or crocs. He has been able to offload this area over the past couple weeks. He is currently on doxycycline, pentoxifylline, nitroglycerin patch and cilostazol by Dr. [name] for his wound care. Currently patient denies signs of infection. ABI in office was 1.3 and he is scheduled for arterial duplex studies on 01/28/23. 5/30; patient presents for follow-up. He has been using Santyl to the wound bed. He had his ABIs completed that showed noncompressible on the left with TBI of 0.35. Currently denies signs of infection. Electronic Signature(s) Signed: 01/01/2023 11:25:14 AM By: Geralyn Corwin DO Entered By: Geralyn Corwin on 01/01/2023 09:46:34 Physical Exam Details -------------------------------------------------------------------------------- [name] ([id number]) [id number] Page 2 of 8 Patient Name: Date of Service: [name] 01/01/2023 7:45 A M Medical Record Number: [id number] Patient Account Number: [id number] Date of Birth/Sex: Treating RN: 1965-08-10 (57 y.o. M) Primary Care Provider: [name] Other Clinician: Referring Provider: Treating Provider/Extender: Donnetta Hail in Treatment: 1 Constitutional respirations regular, non-labored and within target range for patient.. Cardiovascular 2+ dorsalis pedis/posterior tibialis pulses. Psychiatric pleasant and cooperative. Notes T the Achilles region there is an open wound with nonviable tissue throughout no signs of infection including increased warmth, purulent drainage. o Electronic Signature(s) Signed: 01/01/2023 11:25:14 AM By: Geralyn Corwin DO Entered By: Geralyn Corwin on 01/01/2023  09:48:09 -------------------------------------------------------------------------------- Physician Orders Details Patient Name: Date of Service: [name], [name] [name] 01/01/2023 7:45 A M Medical Record Number: [id number] Patient Account Number: [id number] Date of Birth/Sex: Treating RN: April 11, 1966 (57 y.o. [name] Primary Care Provider: [name] Other Clinician: Referring Provider: Treating Provider/Extender: Donnetta Hail in Treatment: 1 Verbal / Phone Orders: No Diagnosis Coding Follow-up Appointments  ppointment in 2 weeks. - Dr. Mikey Bussing Thursday 01/15/2023 @ 0845 Return A Anesthetic Wound #1 Left Achilles (In clinic) Topical Lidocaine 4% applied to wound bed Bathing/ Shower/ Hygiene May shower and wash wound with soap and water. - clean wound with Dial antibacterial soap, then apply new dressing Wound Treatment Wound #1 - Achilles Wound Laterality: Left Cleanser: Soap and Water 1 x Per Day/15 Days Discharge Instructions: May shower and wash wound with dial antibacterial soap and water prior to dressing change. Cleanser: Vashe 5.8 (oz) 1 x Per Day/15 Days Discharge Instructions: Cleanse the wound with Vashe prior to applying a clean dressing using gauze sponges, not tissue or cotton balls. Peri-Wound Care: Skin Prep (Generic) 1 x Per Day/15 Days Discharge Instructions: Use skin prep as directed Prim Dressing: Santyl Ointment 1 x Per Day/15 Days ary Discharge Instructions: Apply nickel thick amount to wound bed as instructed Secondary Dressing: Zetuvit Plus Silicone Border Dressing 4x4 (in/in) (Generic) 1 x Per Day/15 Days Discharge Instructions: Apply silicone border over primary dressing as directed. Consults Vascular - Referral to [facility] to review and treat poor arterial blood flow with ischemic wound [name] ([id number]) [id number] Page 3 of 8 Electronic Signature(s) Signed: 01/01/2023 12:18:27 PM  By: Geralyn Corwin DO Signed: 01/01/2023 4:02:41 PM By: Redmond Pulling RN, BSN Previous Signature: 01/01/2023 11:25:14 AM Version By: Geralyn Corwin DO Entered By: Redmond Pulling on 01/01/2023 12:09:50 Prescription 01/01/2023 -------------------------------------------------------------------------------- [name] Geralyn Corwin DO Patient Name: Provider: 07/21/1966 [id number] Date of Birth: NPI#: Judie Petit [id number] Sex: DEA #: [phone] Phone #: License #: [employer] Signs: Patient Address: [address] [facility] [place], [place] [address] [address] [place], [place] [address] [phone] Allergies metoprolol; niacin; tramadol Provider's Orders Vascular - Referral to [facility] to review and treat poor arterial blood flow with ischemic wound Hand Signature: Date(s): Electronic Signature(s) Signed: 01/01/2023 12:18:27 PM By: Geralyn Corwin DO Signed: 01/01/2023 4:02:41 PM By: Redmond Pulling RN, BSN Previous Signature: 01/01/2023 11:25:14 AM Version By: Geralyn Corwin DO Entered By: Redmond Pulling on 01/01/2023 12:09:50 -------------------------------------------------------------------------------- Problem List Details Patient Name: Date of Service: [name], [name] [name] 01/01/2023 7:45 A M Medical Record Number: [id number] Patient Account Number: [id number] Date of Birth/Sex: Treating RN: Aug 20, 1965 (57 y.o. M) Primary Care Provider: [name] Other Clinician: Referring Provider: Treating Provider/Extender: Donnetta Hail in Treatment: 1 Active Problems ICD-10 Encounter Code Description Active Date MDM Diagnosis S91.302A Unspecified open wound, left foot, initial encounter 12/25/2022 No Yes L97.528 Non-pressure chronic ulcer of other part of left foot with other specified 12/25/2022 No Yes severity JAHAUN, SULLO ([id number]) [id number] Page 4 of  8 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 12/25/2022 No Yes J44.9 Chronic obstructive pulmonary disease, unspecified 12/25/2022 No Yes M06.9 Rheumatoid arthritis, unspecified 12/25/2022 No Yes Inactive Problems Resolved Problems Electronic Signature(s) Signed: 01/01/2023 11:25:14 AM By: Geralyn Corwin DO Entered By: Geralyn Corwin on 01/01/2023 09:45:46 -------------------------------------------------------------------------------- Progress Note Details Patient Name: Date of Service: [name], [name] [name] 01/01/2023 7:45 A M Medical Record Number: [id number] Patient Account Number: [id number] Date of Birth/Sex: Treating RN: 1966/04/25 (57 y.o. M) Primary Care Provider: [name] Other Clinician: Referring Provider: Treating Provider/Extender: Donnetta Hail in Treatment: 1 Subjective Chief Complaint Information obtained from Patient 12/25/2022; wound to the left Achilles region History of Present Illness (HPI) 12/25/2022 Mr. Ilija Chezem is a 57 year old male with a past medical history of controlled insulin-dependent type 1 diabetes, COPD  and rheumatoid arthritis that presents the clinic for a 1 month history of nonhealing ulcer to the left foot. On 03/2020 patient had surgery for left fifth metatarsal fracture and subsequently had hardware removal in March 2022 due to infection. Over the past year he has experienced pain to the lateral left foot however the incision site and wound has healed. 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 He follows with Dr. [name] for this issue. Due to pain to the lateral left foot he uses Hoka sneakers however this rubbed at the back of his heel creating a wound to the Achilles region. He has since changed his shoe wear to boots that come up above this area or crocs. He has been able to offload this area over the past couple weeks. He  is currently on doxycycline, pentoxifylline, nitroglycerin patch and cilostazol by Dr. [name] for his wound care. Currently patient denies signs of infection. ABI in office was 1.3 and he is scheduled for arterial duplex studies on 01/28/23. 5/30; patient presents for follow-up. He has been using Santyl to the wound bed. He had his ABIs completed that showed noncompressible on the left with TBI of 0.35. Currently denies signs of infection. Patient History Information obtained from Patient. Family History Cancer - Father,Mother, Heart Disease - Father, Hypertension - Father, Seizures - Mother, No family history of Diabetes, Kidney Disease, Lung Disease, Stroke, Thyroid Problems, Tuberculosis. Social History Current every day smoker - 40+years, Marital Status - Divorced, Alcohol Use - Never, Drug Use - No History, Caffeine Use - Daily. Medical History Eyes Patient has history of Cataracts Cardiovascular Patient has history of Coronary Artery Disease, Hypertension, Peripheral Venous Disease Endocrine Patient has history of Type I Diabetes Musculoskeletal VERLIN, MANNINO ([id number]) [id number] Page 5 of 8 Patient has history of Rheumatoid Arthritis, Osteoarthritis Hospitalization/Surgery History - internal fixation of left 5th toe fracture. - hardware removal 2022 left foot. - 2020 elbow drained. - 2019 excision oral tumor. - 2019 mouth surgery. - 2017 cardiac cath. 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 - carpal tunnel release. - cataract extraction, bilateral. Medical A Surgical History Notes nd Eyes diabetic retinopathy Respiratory pleural effusion, bilateral Genitourinary CKDIII Musculoskeletal Rheumatoid nodulosis Osteopenia Objective Constitutional respirations regular, non-labored and within target range for patient.. Vitals Time Taken: 8:05 AM, Height: 68 in, Weight: 200 lbs, BMI: 30.4, Temperature: 97.6 F, Pulse: 77 bpm, Respiratory Rate: 18 breaths/min, Blood  Pressure: 136/74 mmHg, Capillary Blood Glucose: 96 mg/dl. Cardiovascular 2+ dorsalis pedis/posterior tibialis pulses. Psychiatric pleasant and cooperative. General Notes: T the Achilles region there is an open wound with nonviable tissue throughout no signs of infection including increased warmth, purulent o drainage. Integumentary (Hair, Skin) Wound #1 status is Open. Original cause of wound was Gradually Appeared. The date acquired was: 12/07/2022. The wound has been in treatment 1 weeks. The wound is located on the Left Achilles. The wound measures 0.8cm length x 0.8cm width x 0.1cm depth; 0.503cm^2 area and 0.05cm^3 volume. There is Fat Layer (Subcutaneous Tissue) exposed. There is no tunneling or undermining noted. There is a medium amount of serosanguineous drainage noted. The wound margin is distinct with the outline attached to the wound base. There is no granulation within the wound bed. There is a large (67-100%) amount of necrotic tissue within the wound bed including Eschar. Assessment Active Problems ICD-10 Unspecified open wound, left foot, initial encounter Non-pressure chronic ulcer of other part of left foot with other  specified severity 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 Chronic obstructive pulmonary disease, unspecified Rheumatoid arthritis, unspecified Patient's wound is stable although it does not have the black eschar anymore. There is still nonviable tissue throughout. No signs of infection. I recommended continuing Santyl. Based on ABI results I recommended following back up with vein and vascular to assure that he has adequate blood flow for healing. We will send referral. Plan Follow-up Appointments: Return Appointment in 2 weeks. - Dr. Mikey Bussing Thursday 01/15/2023 @ 0845 Anesthetic: Wound #1 Left Achilles: (In clinic) Topical Lidocaine 4% applied to wound bed Bathing/ Shower/ Hygiene: May shower and wash wound with soap and water. - clean wound with Dial  antibacterial soap, then apply new dressing Consults ordered were: Vascular - Referral to [facility] to review and treat poor arterial blood flow with ischemic wound WOUND #1: - Achilles Wound Laterality: Left Cleanser: Soap and Water 1 x Per Day/15 Days Discharge Instructions: May shower and wash wound with dial antibacterial soap and water prior to dressing change. DEMETRICE, LEGLEITER ([id number]) [id number] Page 6 of 8 Cleanser: Vashe 5.8 (oz) 1 x Per Day/15 Days Discharge Instructions: Cleanse the wound with Vashe prior to applying a clean dressing using gauze sponges, not tissue or cotton balls. Peri-Wound Care: Skin Prep (Generic) 1 x Per Day/15 Days Discharge Instructions: Use skin prep as directed Prim Dressing: Santyl Ointment 1 x Per Day/15 Days ary Discharge Instructions: Apply nickel thick amount to wound bed as instructed Secondary Dressing: Zetuvit Plus Silicone Border Dressing 4x4 (in/in) (Generic) 1 x Per Day/15 Days Discharge Instructions: Apply silicone border over primary dressing as directed. 1. Continue Santyl 2. Aggressive offloadingopen back shoes 3. Follow-up in 2 weeks 4. Send referral to vein and vascular Electronic Signature(s) Signed: 02/02/2023 2:44:03 PM By: Pearletha Alfred Signed: 02/02/2023 2:58:02 PM By: Geralyn Corwin DO Previous Signature: 01/02/2023 12:29:41 PM Version By: Shawn Stall RN, BSN Previous Signature: 01/02/2023 12:30:29 PM Version By: Geralyn Corwin DO Previous Signature: 01/01/2023 11:25:14 AM Version By: Geralyn Corwin DO Entered By: Pearletha Alfred on 02/02/2023 14:44:03 -------------------------------------------------------------------------------- HxROS Details Patient Name: Date of Service: Stewart, [name] [name] 01/01/2023 7:45 A M Medical Record Number: [id number] Patient Account Number: [id number] Date of Birth/Sex: Treating RN: 1966/02/06 (57 y.o. M) Primary Care Provider: [name] Other Clinician: Referring  Provider: Treating Provider/Extender: Donnetta Hail in Treatment: 1 Information Obtained From Patient Eyes Medical History: Positive for: Cataracts Past Medical History Notes: diabetic retinopathy Respiratory Medical History: Past Medical History Notes: pleural effusion, bilateral Cardiovascular Medical History: Positive for: Coronary Artery Disease; Hypertension; Peripheral Venous Disease Endocrine Medical History: Positive for: Type I Diabetes Treated with: Insulin Blood sugar tested every day: Yes Tested : Genitourinary Medical History: Past Medical History Notes: CKDIII Musculoskeletal Medical History: Positive for: Rheumatoid Arthritis; Osteoarthritis SHYKEEM, MALNAR ([id number]) [id number] Page 7 of 8 Past Medical History Notes: Rheumatoid nodulosis Osteopenia HBO Extended History Items Eyes: Cataracts Immunizations Pneumococcal Vaccine: Received Pneumococcal Vaccination: No Implantable Devices None Hospitalization / Surgery History Type of Hospitalization/Surgery internal fixation of left 5th toe fracture hardware removal 2022 left foot 2020 elbow drained 2019 excision oral tumor 2019 mouth surgery 2017 cardiac cath 2015 CABG carpal tunnel release cataract extraction, bilateral Family and Social History Cancer: Yes - Father,Mother; Diabetes: No; Heart Disease: Yes - Father; Hypertension: Yes - Father; Kidney Disease: No; Lung Disease: No; Seizures: Yes - Mother; Stroke: No; Thyroid Problems: No; Tuberculosis: No; Current every day smoker - 40+years; Marital Status - Divorced; Alcohol Use: Never;  Drug Use: No History; Caffeine Use: Daily; Financial Concerns: No; Food, Clothing or Shelter Needs: No; Support System Lacking: No; Transportation Concerns: No Electronic Signature(s) Signed: 01/01/2023 11:25:14 AM By: Geralyn Corwin DO Entered By: Geralyn Corwin on 01/01/2023  09:46:39 -------------------------------------------------------------------------------- SuperBill Details Patient Name: Date of Service: [name], [name] [name] 01/01/2023 Medical Record Number: [id number] Patient Account Number: [id number] Date of Birth/Sex: Treating RN: 10-Oct-1965 (57 y.o. [name] Primary Care Provider: [name] Other Clinician: Referring Provider: Treating Provider/Extender: Donnetta Hail in Treatment: 1 Diagnosis Coding ICD-10 Codes Code Description [phone] Unspecified open wound, left foot, initial encounter L97.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 J44.9 Chronic obstructive pulmonary disease, unspecified M06.9 Rheumatoid arthritis, unspecified Facility Procedures : CPT4 Code: [id number] Description: 99213 - WOUND CARE VISIT-LEV 3 EST PT Modifier: Quantity: 1 Physician Procedures : CPT4 Code Description Modifier LEONIDES, SWILLEY ([id number]) [id number] 99213 - WC PHYS LEVEL 3 - EST PT 1 ICD-10 Diagnosis Description S91.302A Unspecified open wound, left foot, initial encounter L97.528 Non-pressure  chronic ulcer of other part of left foot with other specified severity 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 J44.9 Chronic obstructive pulmonary disease, unspecified Quantity: 227.pdf Page 8 of 8 Electronic Signature(s) Signed: 01/01/2023 11:25:14 AM By: Geralyn Corwin DO Entered By: Geralyn Corwin on 01/01/2023 09:52:22

## 2023-02-03 ENCOUNTER — Telehealth: Payer: Self-pay

## 2023-02-03 NOTE — Telephone Encounter (Signed)
Wants clearance sent to Dr. Bary Richard Hoffman,DO to allow the patient to be in a hyperbaric chamber to help heal a wound. Can his heart take the pressure?

## 2023-02-03 NOTE — Telephone Encounter (Signed)
Yes it is not going to be a problem

## 2023-02-04 NOTE — Telephone Encounter (Signed)
Would you please send a short note to Dr. Mikey Bussing.She just needs this for documentation purposes.

## 2023-02-10 ENCOUNTER — Encounter: Payer: Self-pay | Admitting: Cardiology

## 2023-02-10 ENCOUNTER — Encounter (HOSPITAL_BASED_OUTPATIENT_CLINIC_OR_DEPARTMENT_OTHER): Payer: 59 | Admitting: Internal Medicine

## 2023-02-10 DIAGNOSIS — E10621 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: Secondary | ICD-10-CM

## 2023-02-10 DIAGNOSIS — I70245 Atherosclerosis of native arteries of left leg with ulceration of other part of foot: Secondary | ICD-10-CM | POA: Diagnosis not present

## 2023-02-10 DIAGNOSIS — S91302A Unspecified open wound, left foot, initial encounter: Secondary | ICD-10-CM | POA: Diagnosis not present

## 2023-02-10 DIAGNOSIS — L97528 Non-pressure chronic ulcer of other part of left foot with other specified severity: Secondary | ICD-10-CM | POA: Diagnosis not present

## 2023-02-10 DIAGNOSIS — E1051 Type 1 diabetes mellitus with diabetic peripheral angiopathy without gangrene: Secondary | ICD-10-CM | POA: Diagnosis not present

## 2023-02-11 NOTE — Progress Notes (Signed)
[name], [name] ([id number]) [id number] Page 1 of 7 Visit Report for 02/10/2023 Arrival Information Details Patient Name: Date of Service: [place], [name] 02/10/2023 10:30 A M Medical Record Number: [id number] Patient Account Number: [id number] Date of Birth/Sex: Treating RN: Dec 28, 1965 (57 y.o. M) Primary Care [name]: [name] Other Clinician: Referring [name]: Treating [name]/Extender: [name] in Treatment: 6 Visit Information History Since Last Visit Added or deleted any medications: Yes Patient Arrived: Ambulatory Any new allergies or adverse reactions: No Arrival Time: 10:53 Had a fall or experienced change in No Accompanied By: self activities of daily living that may affect Transfer Assistance: None risk of falls: Patient Identification Verified: Yes Signs or symptoms of abuse/neglect since last visito No Secondary Verification Process Completed: Yes Hospitalized since last visit: No Patient Requires Transmission-Based Precautions: No Implantable device outside of the clinic excluding No Patient Has Alerts: No cellular tissue based products placed in the center since last visit: Has Dressing in Place as Prescribed: Yes Pain Present Now: No Electronic Signature(s) Signed: 02/10/2023 4:04:59 PM By: Thayer Dallas Entered By: Thayer Dallas on 02/10/2023 10:57:38 -------------------------------------------------------------------------------- Encounter Discharge Information Details Patient Name: Date of Service: [name] Pont, [name] 02/10/2023 10:30 A M Medical Record Number: [id number] Patient Account Number: [id number] Date of Birth/Sex: Treating RN: 30-Mar-1966 (57 y.o. [name] Primary Care Aijalon Demuro: [name] Other Clinician: Referring Kyshawn Teal: Treating Oluwafemi Villella/Extender: [name] in Treatment: 6 Encounter Discharge Information Items Post Procedure  Vitals Discharge Condition: Stable Temperature (F): 98.4 Ambulatory Status: Ambulatory Pulse (bpm): 72 Discharge Destination: Home Respiratory Rate (breaths/min): 20 Transportation: Private Auto Blood Pressure (mmHg): 149/65 Accompanied By: self Schedule Follow-up Appointment: Yes Clinical Summary of Care: Electronic Signature(s) Signed: 02/10/2023 5:26:31 PM By: Shawn Stall RN, BSN Entered By: Shawn Stall on 02/10/2023 11:36:46 [name], [name] Garter ([id number]) [id number] Page 2 of 7 -------------------------------------------------------------------------------- Lower Extremity Assessment Details Patient Name: Date of Service: [name], [name] 02/10/2023 10:30 A M Medical Record Number: [id number] Patient Account Number: [id number] Date of Birth/Sex: Treating RN: [birth year] (57 y.o. M) Primary Care Eris Breck: [name] Other Clinician: Referring Aran Menning: Treating Teliyah Royal/Extender: [name] in Treatment: 6 Edema Assessment Assessed: [Left: No] [Right: No] Edema: [Left: N] [Right: o] Calf Left: Right: Point of Measurement: 32 cm From Medial Instep 36 cm Ankle Left: Right: Point of Measurement: 13 cm From Medial Instep 21 cm Electronic Signature(s) Signed: 02/10/2023 4:04:59 PM By: Thayer Dallas Entered By: Thayer Dallas on 02/10/2023 10:59:52 -------------------------------------------------------------------------------- Multi Wound Chart Details Patient Name: Date of Service: [name] Pont, [name] 02/10/2023 10:30 A M Medical Record Number: [id number] Patient Account Number: [id number] Date of Birth/Sex: Treating RN: 1966/01/08 (57 y.o. 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 N/A N/A Secondary Etiology: Cataracts, Coronary Artery Disease, N/A N/A Comorbid HistoryJOVAHN, [name] ([id number]) [id number] Page 3 of 7 Hypertension, Peripheral Venous Disease, Type I Diabetes, Rheumatoid Arthritis, Osteoarthritis 12/07/2022 N/A N/A Date Acquired: 6 N/A N/A Weeks of Treatment: Open N/A N/A Wound Status: No N/A N/A Wound Recurrence: 0.4x0.8x0.2 N/A N/A Measurements L x W x D (cm) 0.251 N/A N/A A (cm) : rea 0.05  N/A N/A Volume (cm) : 50.10% N/A N/A % Reduction in A rea: 0.00% N/A N/A % Reduction in Volume: Grade 2 N/A N/A Classification: Medium N/A N/A Exudate A mount: Serosanguineous N/A N/A Exudate Type: red, brown N/A N/A Exudate Color: Epibole N/A N/A Wound Margin: None Present (0%) N/A N/A Granulation A mount: Large (67-100%) N/A N/A Necrotic A mount: Fat Layer (Subcutaneous Tissue): Yes N/A N/A Exposed Structures: Fascia: No Tendon: No Muscle: No Joint: No Bone: No None N/A N/A Epithelialization: Chemical/Enzymatic/Mechanical N/A N/A Debridement: N/A N/A N/A Instrument: None N/A N/A Bleeding: Debridement Treatment Response: Procedure was tolerated well N/A N/A Post Debridement Measurements L x 0.4x0.8x0.2 N/A N/A W x D (cm) 0.05 N/A N/A Post Debridement Volume: (cm) Excoriation: No N/A N/A Periwound Skin Texture: Induration: No Callus: No Crepitus: No Rash: No Scarring: No Maceration: No N/A N/A Periwound Skin Moisture: Dry/Scaly: No Atrophie Blanche: No N/A N/A Periwound Skin Color: Cyanosis: No Ecchymosis: No Erythema: No Hemosiderin Staining: No Mottled: No Pallor: No Rubor: No Debridement N/A  N/A Procedures Performed: Treatment Notes Wound #1 (Achilles) Wound Laterality: Left Cleanser Soap and Water Discharge Instruction: May shower and wash wound with dial antibacterial soap and water prior to dressing change. Vashe 5.8 (oz) Discharge Instruction: Cleanse the wound with Vashe prior to applying a clean dressing using gauze sponges, not tissue or cotton balls. Peri-Wound Care Skin Prep Discharge Instruction: Use skin prep as directed Topical Primary Dressing Santyl Ointment Discharge Instruction: Apply nickel thick amount to wound bed as instructed Secondary Dressing Zetuvit Plus Silicone Border Dressing 4x4 (in/in) Discharge Instruction: Apply silicone border over primary dressing as directed. Secured With Compression Wrap Compression Stockings Add-Ons [name], [name] ([id number]) [id number] Page 4 of 7 Electronic Signature(s) Signed: 02/10/2023 2:56:31 PM By: Geralyn Corwin DO Entered By: Geralyn Corwin on 02/10/2023 13:20:58 -------------------------------------------------------------------------------- Multi-Disciplinary Care Plan Details Patient Name: Date of Service: [name], [name] 02/10/2023 10:30 A M Medical Record Number: [id number] Patient Account Number: [id number] Date of Birth/Sex: Treating RN: 1966/07/08 (57 y.o. [name] Primary Care Liban Guedes: [name] Other Clinician: Referring Jenson Beedle: Treating Omer Monter/Extender: [name] in Treatment: 6 Active Inactive Wound/Skin Impairment Nursing Diagnoses: Impaired tissue integrity Knowledge deficit related to smoking impact on wound healing Knowledge deficit related to ulceration/compromised skin integrity Goals: Patient will have a decrease in wound volume by X% from date: (specify in notes) Date Initiated: 12/25/2022 Target Resolution Date: 04/02/2023 Goal Status: Active Patient/caregiver will verbalize understanding of  skin care regimen Date Initiated: 12/25/2022 Target Resolution Date: 04/03/2023 Goal Status: Active Ulcer/skin breakdown will have a volume reduction of 30% by week 4 Date Initiated: 12/25/2022 Date Inactivated: 01/27/2023 Target Resolution Date: 01/22/2023 Unmet Reason: no major changes; Goal Status: Unmet continue current treatment. Interventions: Assess patient/caregiver ability to obtain necessary supplies Assess patient/caregiver ability to perform ulcer/skin care regimen upon admission and as needed Assess ulceration(s) every visit Provide education on smoking Provide education on ulcer and skin care Notes: Electronic Signature(s) Signed: 02/10/2023 5:26:31 PM By: Shawn Stall RN, BSN Entered By: Shawn Stall on 02/10/2023 11:15:46 -------------------------------------------------------------------------------- Pain Assessment Details Patient Name: Date of Service: [name] Pont, [name] 02/10/2023 10:30 A M Medical Record Number: [id number] Patient Account Number: [id number] Date of Birth/Sex: Treating RN: 12/15/1965 (57 y.o. M) Primary Care Kamber Vignola: [name] Other Clinician: Referring Trystin Terhune: Treating Danique Hartsough/Extender: [name] in Treatment: 6 [name], [name] ([id number]) [id number] Page 5 of 7 Active Problems Location of Pain Severity and Description of Pain Patient Has  Paino No Site Locations Pain Management and Medication Current Pain Management: Electronic Signature(s) Signed: 02/10/2023 4:04:59 PM By: Thayer Dallas Entered By: Thayer Dallas on 02/10/2023 11:00:29 -------------------------------------------------------------------------------- Patient/Caregiver Education Details Patient Name: Date of Service: [name], [name] 7/9/2024andnbsp10:30 A M Medical Record Number: [id number] Patient Account Number: [id number] Date of Birth/Gender: Treating RN: 19-Jul-1966 (57 y.o. [name] Primary  Care Physician: [name] Other Clinician: Referring Physician: Treating Physician/Extender: [name] in Treatment: 6 Education Assessment Education Provided To: Patient Education Topics Provided Wound/Skin Impairment: Handouts: Caring for Your Ulcer Methods: Explain/Verbal Responses: Reinforcements needed Electronic Signature(s) Signed: 02/10/2023 5:26:31 PM By: Shawn Stall RN, BSN Entered By: Shawn Stall on 02/10/2023 11:19:24 [name] ([id number]) [id number] Page 6 of 7 -------------------------------------------------------------------------------- Wound Assessment Details Patient Name: Date of Service: [name], [name] 02/10/2023 10:30 A M Medical Record Number: [id number] Patient Account Number: [id number] Date of Birth/Sex: Treating RN: January 21, 1966 (57 y.o. M) Primary Care Birdie Beveridge: [name] Other Clinician: Referring Korrie Hofbauer: Treating Faizon Capozzi/Extender: [name] in Treatment: 6 Wound Status Wound Number: 1 Primary Diabetic Wound/Ulcer of the Lower Extremity Etiology: Wound Location: Left Achilles Secondary Arterial Insufficiency Ulcer Wounding Event: Gradually Appeared Etiology: Date Acquired: 12/07/2022 Wound Open Weeks Of Treatment: 6 Status: Clustered Wound: No Comorbid Cataracts, Coronary Artery Disease, Hypertension, Peripheral History: Venous Disease, Type I Diabetes, Rheumatoid Arthritis, Osteoarthritis Photos Wound Measurements Length: (cm) 0.4 Width: (cm) 0.8 Depth: (cm) 0.2 Area: (cm) 0.251 Volume: (cm) 0.05 % Reduction in Area: 50.1% % Reduction in Volume: 0% Epithelialization: None Wound Description Classification: Grade 2 Wound Margin: Epibole Exudate Amount: Medium Exudate Type: Serosanguineous Exudate Color: red, brown Foul Odor After Cleansing: No Slough/Fibrino Yes Wound Bed Granulation Amount: None Present (0%) Exposed  Structure Necrotic Amount: Large (67-100%) Fascia Exposed: No Necrotic Quality: Adherent Slough Fat Layer (Subcutaneous Tissue) Exposed: Yes Tendon Exposed: No Muscle Exposed: No Joint Exposed: No Bone Exposed: No Periwound Skin Texture Texture Color No Abnormalities Noted: No No Abnormalities Noted: No Callus: No Atrophie Blanche: No Crepitus: No Cyanosis: No Excoriation: No Ecchymosis: No Induration: No Erythema: No Rash: No Hemosiderin Staining: No Scarring: No Mottled: No Pallor: No Moisture Rubor: No No Abnormalities Noted: No Dry / Scaly: No Maceration: No Treatment Notes Wound #1 (Achilles) Wound Laterality: Left [name], [name] ([id number]) [id number] Page 7 of 7 Cleanser Soap and Water Discharge Instruction: May shower and wash wound with dial antibacterial soap and water prior to dressing change. Vashe 5.8 (oz) Discharge Instruction: Cleanse the wound with Vashe prior to applying a clean dressing using gauze sponges, not tissue or cotton balls. Peri-Wound Care Skin Prep Discharge Instruction: Use skin prep as directed Topical Primary Dressing Santyl Ointment Discharge Instruction: Apply nickel thick amount to wound bed as instructed Secondary Dressing Zetuvit Plus Silicone Border Dressing 4x4 (in/in) Discharge Instruction: Apply silicone border over primary dressing as directed. Secured With Compression Wrap Compression Stockings Facilities manager) Signed: 02/10/2023 4:04:59 PM By: Thayer Dallas Entered By: Thayer Dallas on 02/10/2023 11:03:12 -------------------------------------------------------------------------------- Vitals Details Patient Name: Date of Service: [name] Pont, [name] 02/10/2023 10:30 A M Medical Record Number: [id number] Patient Account Number: [id number] Date of Birth/Sex: Treating RN: 10-19-1965 (57 y.o. M) Primary Care Myan Locatelli: [name] Other Clinician: Referring  Chizuko Trine: Treating Imagine Nest/Extender: [name] in Treatment: 6 Vital Signs Time Taken: 10:57 Temperature (F): 98.4 Height (in): 68 Pulse (bpm): 72 Weight (lbs): 200 Respiratory Rate (breaths/min): 20 Body Mass Index (BMI): 30.4 Blood Pressure (mmHg): 149/65  Reference Range: 80 - 120 mg / dl Electronic Signature(s) Signed: 02/10/2023 4:04:59 PM By: Thayer Dallas Entered By: Thayer Dallas on 02/10/2023 10:58:25

## 2023-02-14 ENCOUNTER — Encounter (HOSPITAL_BASED_OUTPATIENT_CLINIC_OR_DEPARTMENT_OTHER): Payer: Self-pay

## 2023-02-16 ENCOUNTER — Encounter (HOSPITAL_BASED_OUTPATIENT_CLINIC_OR_DEPARTMENT_OTHER): Payer: 59 | Admitting: Internal Medicine

## 2023-02-16 DIAGNOSIS — E10621 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: Secondary | ICD-10-CM

## 2023-02-16 DIAGNOSIS — I70245 Atherosclerosis of native arteries of left leg with ulceration of other part of foot: Secondary | ICD-10-CM

## 2023-02-16 DIAGNOSIS — E1051 Type 1 diabetes mellitus with diabetic peripheral angiopathy without gangrene: Secondary | ICD-10-CM | POA: Diagnosis not present

## 2023-02-16 DIAGNOSIS — J449 Chronic obstructive pulmonary disease, unspecified: Secondary | ICD-10-CM

## 2023-02-16 DIAGNOSIS — S91302A Unspecified open wound, left foot, initial encounter: Secondary | ICD-10-CM | POA: Diagnosis not present

## 2023-02-16 DIAGNOSIS — L97528 Non-pressure chronic ulcer of other part of left foot with other specified severity: Secondary | ICD-10-CM

## 2023-02-16 LAB — GLUCOSE, CAPILLARY
Glucose-Capillary: 130 mg/dL — ABNORMAL HIGH (ref 70–99)
Glucose-Capillary: 188 mg/dL — ABNORMAL HIGH (ref 70–99)
Glucose-Capillary: 264 mg/dL — ABNORMAL HIGH (ref 70–99)

## 2023-02-16 NOTE — Progress Notes (Addendum)
JAICE, DIGIOIA ([id number]) [id number] Page 1 of 2 Visit Report for 02/16/2023 Arrival Information Details Patient Name: Date of Service: [place], [name] 02/16/2023 7:30 A M Medical Record Number: [id number] Patient Account Number: [id number] Date of Birth/Sex: Treating RN: 1966-01-08 (57 y.o. [name] Primary Care Zaul Hubers: [name] Other Clinician: [name] Referring Shandie Bertz: Treating Smt Lokey/Extender: Donnetta Hail in Treatment: 7 Visit Information History Since Last Visit All ordered tests and consults were completed: Yes Patient Arrived: Ambulatory Added or deleted any medications: No Arrival Time: 07:30 Any new allergies or adverse reactions: No Accompanied By: self Had a fall or experienced change in No Transfer Assistance: None activities of daily living that may affect Patient Identification Verified: Yes risk of falls: Secondary Verification Process Completed: Yes Signs or symptoms of abuse/neglect since last visito No Patient Requires Transmission-Based Precautions: No Hospitalized since last visit: No Patient Has Alerts: No Implantable device outside of the clinic excluding No cellular tissue based products placed in the center since last visit: Pain Present Now: No Electronic Signature(s) Signed: 02/16/2023 10:26:25 AM By: [name] CHT EMT BS , , Entered By: [name] on 02/16/2023 10:26:24 -------------------------------------------------------------------------------- Encounter Discharge Information Details Patient Name: Date of Service: [name] Pont, [name] 02/16/2023 7:30 A M Medical Record Number: [id number] Patient Account Number: [id number] Date of Birth/Sex: Treating RN: [birth year] (57 y.o. [name] Primary Care Jenean Escandon: [name] Other Clinician: [name] Referring Quinisha Mould: Treating Janely Gullickson/Extender: Donnetta Hail in Treatment: 7 Encounter Discharge Information Items Discharge Condition: Stable Ambulatory Status: Ambulatory Discharge Destination: Other (Note Required) Orders Sent: Yes Accompanied By: staff Schedule Follow-up Appointment: No Clinical Summary of Care: Notes Patient has wound care encounter after treatment today. Electronic Signature(s) Signed: 02/16/2023 11:54:18 AM By: [name] CHT EMT BS , , Entered By: [name] on 02/16/2023 11:54:18 Kluth, [name] ([id number]) [id number] Page 2 of 2 -------------------------------------------------------------------------------- Vitals Details Patient Name: Date of Service: [name], [name] 02/16/2023 7:30 A M Medical Record Number: [id number] Patient Account Number: [id number] Date of Birth/Sex: Treating RN: 1966-01-21 (57 y.o. [name] Primary Care Tyshae Stair: [name] Other Clinician: [name] Referring Shauntavia Brackin: Treating Momoko Slezak/Extender: Donnetta Hail in Treatment: 7 Vital Signs Time Taken: 07:46 Temperature (F): 97.5 Height (in): 68 Pulse (bpm): 70 Weight (lbs): 200 Respiratory Rate (breaths/min): 18 Body Mass Index (BMI): 30.4 Blood Pressure (mmHg): 119/59 Capillary Blood Glucose (mg/dl): 536 Reference Range: 80 - 120 mg / dl Electronic Signature(s) Signed: 02/16/2023 10:42:17 AM By: [name] CHT EMT BS , , Entered By: [name] on 02/16/2023 10:42:17

## 2023-02-16 NOTE — Progress Notes (Addendum)
TERREZ, ROSALES ([id number]) [id number] Page 1 of 2 Visit Report for 02/16/2023 HBO Details Patient Name: Date of Service: [name], [name] 02/16/2023 7:30 A M Medical Record Number: [id number] Patient Account Number: [id number] Date of Birth/Sex: Treating RN: 1965-12-05 (57 y.o. [name] Primary Care Deontrey Massi: [name] Other Clinician: [name] Referring Ryder Chesmore: Treating Ronin Rehfeldt/Extender: Donnetta Hail in Treatment: 7 HBO Treatment Course Details Treatment Course Number: 1 Ordering Carrol Bondar: Geralyn Corwin T Treatments Ordered: otal 40 HBO Treatment Start Date: 02/16/2023 HBO Indication: Other (specify in Notes) Notes: Acute peripheral arterial insufficiency HBO Treatment Details Treatment Number: 1 Patient Type: Outpatient Chamber Type: Monoplace Chamber Serial #: [id number] Treatment Protocol: 2.0 ATA with 90 minutes oxygen, with two 5 minute air breaks Treatment Details Compression Rate Down: 1.0 psi / minute De-Compression Rate Up: 1.0 psi / minute A breaks and breathing ir Compress Tx Pressure periods Decompress Decompress Begins Reached (leave unused spaces Begins Ends blank) Chamber Pressure (ATA 1 2 2 2 2 2  --2 1 ) Clock Time (24 hr) 08:51 09:10 09:40 09:45 10:15 10:20 - - 10:50 11:05 Treatment Length: 134 (minutes) Treatment Segments: 4 Vital Signs Capillary Blood Glucose Reference Range: 80 - 120 mg / dl HBO Diabetic Blood Glucose Intervention Range: <131 mg/dl or >536 mg/dl Type: Time Vitals Blood Pulse: Respiratory Temperature: Capillary Blood Glucose Pulse Action Taken: Pressure: Rate: Glucose (mg/dl): Meter #: Oximetry (%) Taken: Pre 07:46 119/59 70 18 97.5 130 2 Given 8 oz Glucerna and 2 x 4 oz Orange Juice Pre 08:18 128/68 64 18 97 264 2 Cleared for HBOT per protocol Treatment Response Treatment Toleration: Well Treatment Completion Status: Treatment Completed without  Adverse Event Treatment Notes [name] arrived for treatment with blood glucose level of 130 mg/dL stating that he had consumed yogurt, a meal replacement shake, and orange juice around 0700 and lantis at approximately 0600 this morning. He was given an 8 oz Glucerna and 2 orange juice (4 oz). He prepared for treatment per orders. His blood glucose was measured again at 0818 with a result of 264 mg/dL. After performing a safety check and being examined by Dr. Mikey Bussing for first treatment, he was placed in the chamber which was compressed with 100% oxygen at a rate of 1 psi/min with ventilation on full, effectively causing travel rate to be approximately 0.75 psi/min. He denied any issues with ear equalization and/or pain associated with barotrauma. [name] tolerated the treatment and the subsequent decompression of the chamber at a rate set of 1 psi/min. His post-treatment vital signs were within normal range per protocol except for blood glucose level of 264 mg/dL. He states he is going to self administer insulin as appropriate after treatment. He was stable upon discharge to wound clinic visit. Physician HBO Attestation: I certify that I supervised this HBO treatment in accordance with Medicare guidelines. A trained emergency response team is readily available per Yes hospital policies and procedures. Continue HBOT as ordered. Yes Electronic Signature(s) Signed: 02/17/2023 12:59:52 PM By: [name] CHT EMT BS , , Signed: 02/17/2023 4:51:26 PM By: Geralyn Corwin DO Previous Signature: 02/16/2023 11:45:39 AM Version By: [name] CHT EMT BS , , Entered By: [name] on 02/17/2023 12:59:52 [name] ([id number]) [id number] Page 2 of 2 -------------------------------------------------------------------------------- HBO Safety Checklist Details Patient Name: Date of Service: [name], [name] 02/16/2023 7:30 A M Medical Record Number:  [id number] Patient Account Number: [id number] Date of Birth/Sex: Treating RN: 26-Jul-1966 (57 y.o. [name],  Linda Primary Care Jalayia Bagheri: [name] Other Clinician: [name] Referring Sophia Sperry: Treating Ferdinand Revoir/Extender: Donnetta Hail in Treatment: 7 HBO Safety Checklist Items Safety Checklist Consent Form Signed Patient voided / foley secured and emptied When did you last eato 0700 Last dose of injectable or oral agent 0600 Ostomy pouch emptied and vented if applicable NA All implantable devices assessed, documented and approved NA Libre 2 on Approved Items List Intravenous access site secured and place NA Valuables secured Linens and cotton and cotton/polyester blend (less than 51% polyester) Personal oil-based products / skin lotions / body lotions removed Wigs or hairpieces removed NA Smoking or tobacco materials removed NA Books / newspapers / magazines / loose paper removed Cologne, aftershave, perfume and deodorant removed Jewelry removed (may wrap wedding band) Make-up removed NA Hair care products removed NA Libre 2 on Approved Items List, otherwise all Battery operated devices (external) removed NA electronics removed. Heating patches and chemical warmers removed Titanium eyewear removed Nail polish cured greater than 10 hours NA Casting material cured greater than 10 hours NA Hearing aids removed NA Loose dentures or partials removed NA Prosthetics have been removed NA Patient demonstrates correct use of air break device (if applicable) Patient concerns have been addressed Patient grounding bracelet on and cord attached to chamber Specifics for Inpatients (complete in addition to above) Medication sheet sent with patient NA Intravenous medications needed or due during therapy sent with patient NA Drainage tubes (e.g. nasogastric tube or chest tube secured and vented) NA Endotracheal or Tracheotomy tube  secured NA Cuff deflated of air and inflated with saline NA Airway suctioned NA Notes Paper version used prior to treatment start. Electronic Signature(s) Signed: 02/18/2023 6:02:46 PM By: [name] CHT EMT BS , , Previous Signature: 02/16/2023 10:45:45 AM Version By: [name] CHT EMT BS , , Entered By: [name] on 02/18/2023 18:02:45

## 2023-02-17 ENCOUNTER — Ambulatory Visit: Payer: 59 | Admitting: Vascular Surgery

## 2023-02-17 ENCOUNTER — Encounter (HOSPITAL_BASED_OUTPATIENT_CLINIC_OR_DEPARTMENT_OTHER): Payer: 59 | Admitting: Internal Medicine

## 2023-02-17 DIAGNOSIS — E10621 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: Secondary | ICD-10-CM | POA: Diagnosis not present

## 2023-02-17 DIAGNOSIS — I70245 Atherosclerosis of native arteries of left leg with ulceration of other part of foot: Secondary | ICD-10-CM

## 2023-02-17 DIAGNOSIS — L97528 Non-pressure chronic ulcer of other part of left foot with other specified severity: Secondary | ICD-10-CM

## 2023-02-17 DIAGNOSIS — E1051 Type 1 diabetes mellitus with diabetic peripheral angiopathy without gangrene: Secondary | ICD-10-CM | POA: Diagnosis not present

## 2023-02-17 DIAGNOSIS — S91302A Unspecified open wound, left foot, initial encounter: Secondary | ICD-10-CM | POA: Diagnosis not present

## 2023-02-17 LAB — GLUCOSE, CAPILLARY
Glucose-Capillary: 143 mg/dL — ABNORMAL HIGH (ref 70–99)
Glucose-Capillary: 125 mg/dL — ABNORMAL HIGH (ref 70–99)

## 2023-02-17 NOTE — Progress Notes (Signed)
[name], [name] ([id number]) [id number] Page 1 of 1 Visit Report for 02/17/2023 SuperBill Details Patient Name: Date of Service: [place], [name] 02/17/2023 Medical Record Number: [id number] Patient Account Number: [id number] Date of Birth/Sex: Treating RN: 09/14/1965 (57 y.o. 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 J44.9 Chronic obstructive pulmonary disease, unspecified M06.9 Rheumatoid arthritis, unspecified Z87.891 Personal history of nicotine dependence Facility Procedures CPT4 Code Description Modifier Quantity [id number] G0277-(Facility Use Only) HBOT full body chamber, , 4 ICD-10 Diagnosis Description 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 S91.302A Unspecified open wound, left foot, initial encounter 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 Physician Procedures Quantity CPT4 Code Description Modifier [id number] [id number] - WC PHYS HYPERBARIC OXYGEN THERAPY 1 ICD-10 Diagnosis Description 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

## 2023-02-17 NOTE — Progress Notes (Signed)
[name], [name] ([id number]) [id number] Page 1 of 2 Visit Report for 02/16/2023 Problem List Details Patient Name: Date of Service: [name], [name] 02/16/2023 7:30 A M Medical Record Number: [id number] Patient Account Number: [id number] Date of Birth/Sex: Treating RN: [birth year] (57 y.o. 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 12/25/2022 No Yes J44.9 Chronic obstructive pulmonary disease, unspecified 12/25/2022 No Yes M06.9 Rheumatoid arthritis, unspecified 12/25/2022 No Yes Z87.891 Personal history of nicotine dependence 01/27/2023 No Yes Inactive Problems Resolved Problems Electronic Signature(s) Signed: 02/16/2023 11:53:05 AM By: [name] CHT EMT BS , , Signed: 02/17/2023 4:51:26 PM By: Geralyn Corwin DO Entered By: [name] on 02/16/2023 11:53:05 -------------------------------------------------------------------------------- SuperBill Details Patient Name: Date of Service: [name] 02/16/2023 [name] ([id number]) [id number] Page 2 of 2 Medical Record Number: [id number] Patient Account Number: [id number] Date of Birth/Sex: Treating RN: [birth year] (57 y.o. 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 J44.9 Chronic obstructive pulmonary disease, unspecified M06.9 Rheumatoid arthritis, unspecified Z87.891 Personal history of nicotine dependence Facility Procedures : CPT4 Code Description: [id number] G0277-(Facility Use Only) HBOT full body chamber, , ICD-10 Diagnosis Description I70.245 Atherosclerosis of native arteries of left leg with ulceration of L97.528 Non-pressure chronic ulcer of other part of left foot  with other S91.302A Unspecified open wound, left foot, initial encounter 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 Modifier: other part of f specified severi Quantity: 4 oot ty Physician Procedures : CPT4 Code Description Modifier [id number] - WC PHYS HYPERBARIC OXYGEN THERAPY ICD-10 Diagnosis Description I70.245 Atherosclerosis of native arteries of left leg with ulceration of other part of fo L97.528 Non-pressure chronic ulcer of other part of  left foot with other specified severit S91.302A Unspecified open wound, left foot, initial encounter 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

## 2023-02-17 NOTE — Progress Notes (Signed)
[name] ([id number]) [id number] Page 1 of 7 Visit Report for 02/16/2023 Arrival Information Details Patient Name: Date of Service: [name], [name] 02/16/2023 10:30 A [name] Medical Record Number: [id number] Patient Account Number: [id number] Date of Birth/Sex: Treating RN: 09-Jan-1966 (57 y.o. Male) [name] Primary Care [name]: [name] Other Clinician: Referring [name]: Treating [name]: [name]: 7 Visit Information History Since Last Visit Added or deleted any medications: No Patient Arrived: Ambulatory Any new allergies or adverse reactions: No Arrival Time: 11:28 Had a fall or experienced change in No Accompanied By: self activities of daily living that may affect Transfer Assistance: None risk of falls: Patient Identification Verified: Yes Signs or symptoms of abuse/neglect since last visito No Secondary Verification Process Completed: Yes Hospitalized since last visit: No Patient Requires Transmission-Based Precautions: No Implantable device outside of the clinic excluding No Patient Has Alerts: No cellular tissue based products placed in the center since last visit: Has Dressing in Place as Prescribed: Yes Pain Present Now: No Electronic Signature(s) Signed: 02/16/2023 4:41:34 PM By: [name] Entered By: [name] on 02/16/2023 11:28:50 -------------------------------------------------------------------------------- Encounter Discharge Information Details Patient Name: Date of Service: [name], [name] [name] 02/16/2023 10:30 A [name] Medical Record Number: [id number] Patient Account Number: [id number] Date of Birth/Sex: Treating RN: 12-12-1965 (57 y.o. Male) [name] Primary Care Liridona Mashaw: [name] Other Clinician: Referring Sherece Gambrill: Treating [name]: [name]: 7 Encounter  Discharge Information Items Post Procedure Vitals Discharge Condition: Stable Temperature (F): 97 Ambulatory Status: Ambulatory Pulse (bpm): 64 Discharge Destination: Home Respiratory Rate (breaths/min): 18 Transportation: Private Auto Blood Pressure (mmHg): 126/68 Accompanied By: self Schedule Follow-up Appointment: Yes Clinical Summary of Care: Patient Declined Electronic Signature(s) Signed: 02/16/2023 4:41:34 PM By: [name] Entered By: [name] on 02/16/2023 11:52:55 [name], [name] ([id number]) [id number] Page 2 of 7 -------------------------------------------------------------------------------- Lower Extremity Assessment Details Patient Name: Date of Service: [name], [name] 02/16/2023 10:30 A [name] Medical Record Number: [id number] Patient Account Number: [id number] Date of Birth/Sex: Treating RN: 1966-06-28 (57 y.o. Male) [name] Primary Care Yessica Putnam: [name] Other Clinician: Referring Yaquelin Langelier: Treating Peta Peachey/Extender: [name]: 7 Edema Assessment Assessed: [Left: No] [Right: No] Edema: [Left: N] [Right: o] Calf Left: Right: Point of Measurement: 32 cm From Medial Instep 36 cm Ankle Left: Right: Point of Measurement: 13 cm From Medial Instep 21 cm Electronic Signature(s) Signed: 02/16/2023 4:41:34 PM By: [name] Entered By: [name] on 02/16/2023 11:29:00 -------------------------------------------------------------------------------- Multi Wound Chart Details Patient Name: Date of Service: [name], [name] [name] 02/16/2023 10:30 A [name] Medical Record Number: [id number] Patient Account Number: [id number] Date of Birth/Sex: Treating RN: Sep 10, 1965 (57 y.o. Male) Primary Care Nicolle Heward: [name] Other Clinician: Referring Burle Kwan: Treating Tyshun Tuckerman/Extender: [name]: 7 Vital  Signs Height(in): 68 Capillary Blood Glucose(mg/dl): 536 Weight(lbs): 644 Pulse(bpm): 64 Body Mass Index(BMI): 30.4 Blood Pressure(mmHg): 128/68 Temperature(F): 97.0 Respiratory Rate(breaths/min): 18 [1:Photos:] [N/A:N/A] Left Achilles N/A N/A Wound Location: Gradually Appeared N/A N/A Wounding Event: Diabetic Wound/Ulcer of the Lower N/A N/A Primary Etiology: Extremity Arterial Insufficiency Ulcer N/A N/A Secondary Etiology: Cataracts, Coronary Artery Disease, N/A N/A Comorbid HistoryKIMBERLY, COYE ([id number]) [id number] Page 3 of 7 Hypertension, Peripheral Venous Disease, Type I Diabetes, Rheumatoid Arthritis, Osteoarthritis 12/07/2022 N/A N/A Date Acquired: 7 N/A N/A Weeks of [name]: Open N/A N/A Wound Status: No N/A N/A Wound Recurrence: 0.6x0.5x0.3 N/A N/A Measurements L x W x D (cm)  0.236 N/A N/A A (cm) : rea 0.071 N/A N/A Volume (cm) : 53.10% N/A N/A % Reduction in A rea: -42.00% N/A N/A % Reduction in Volume: Grade 2 N/A N/A Classification: Medium N/A N/A Exudate A mount: Serous N/A N/A Exudate Type: amber N/A N/A Exudate Color: Epibole N/A N/A Wound Margin: None Present (0%) N/A N/A Granulation A mount: Large (67-100%) N/A N/A Necrotic A mount: Fat Layer (Subcutaneous Tissue): Yes N/A N/A Exposed Structures: Fascia: No Tendon: No Muscle: No Joint: No Bone: No None N/A N/A Epithelialization: Excoriation: No N/A N/A Periwound Skin Texture: Induration: No Callus: No Crepitus: No Rash: No Scarring: No Maceration: No N/A N/A Periwound Skin Moisture: Dry/Scaly: No Atrophie Blanche: No N/A N/A Periwound Skin Color: Cyanosis: No Ecchymosis: No Erythema: No Hemosiderin Staining: No Mottled: No Pallor: No Rubor: No [name] Notes Electronic Signature(s) Signed: 02/17/2023 4:51:26 PM By: [name] Corwin DO Entered By: [name] on 02/16/2023  11:48:00 -------------------------------------------------------------------------------- Multi-Disciplinary Care Plan Details Patient Name: Date of Service: [name], [name] [name] 02/16/2023 10:30 A [name] Medical Record Number: [id number] Patient Account Number: [id number] Date of Birth/Sex: Treating RN: January 29, 1966 (57 y.o. Male) [name] Primary Care Artemis Koller: [name] Other Clinician: Referring Chelcey Caputo: Treating Letia Guidry/Extender: [name]: 7 Active Inactive HBO Nursing Diagnoses: Potential for barotraumas to ears, sinuses, teeth, and lungs or cerebral gas embolism related to changes in atmospheric pressure inside hyperbaric oxygen chamber Potential for oxygen toxicity seizures related to delivery of 100% oxygen at an increased atmospheric pressure Potential for pulmonary oxygen toxicity related to delivery of 100% oxygen at an increased atmospheric pressure Goals: Patient and/or family will be able to state/discuss factors appropriate to the management of their disease process during [name] [name], [name] ([id number]) [id number] Page 4 of 7 Date Initiated: 02/16/2023 Target Resolution Date: 04/10/2023 Goal Status: Active Patient will tolerate the hyperbaric oxygen therapy [name] Date Initiated: 02/16/2023 Target Resolution Date: 04/10/2023 Goal Status: Active Patient/caregiver will verbalize understanding of HBO goals, rationale, procedures and potential hazards Date Initiated: 02/16/2023 Target Resolution Date: 04/10/2023 Goal Status: Active Interventions: Administer the correct therapeutic gas delivery based on the patients needs and limitations, per physician order Assess and provide for patients comfort related to the hyperbaric environment and equalization of middle ear Assess for signs and symptoms related to adverse events, including but not limited to confinement anxiety, pneumothorax,  oxygen toxicity and baurotrauma Assess patient for any history of confinement anxiety Assess patient's knowledge and expectations regarding hyperbaric medicine and provide education related to the hyperbaric environment, goals of [name] and prevention of adverse events Implement protocols to decrease risk of pneumothorax in high risk patients Notes: Wound/Skin Impairment Nursing Diagnoses: Impaired tissue integrity Knowledge deficit related to smoking impact on wound healing Knowledge deficit related to ulceration/compromised skin integrity Goals: Patient will have a decrease in wound volume by X% from date: (specify in notes) Date Initiated: 12/25/2022 Target Resolution Date: 04/02/2023 Goal Status: Active Patient/caregiver will verbalize understanding of skin care regimen Date Initiated: 12/25/2022 Target Resolution Date: 04/03/2023 Goal Status: Active Ulcer/skin breakdown will have a volume reduction of 30% by week 4 Date Initiated: 12/25/2022 Date Inactivated: 01/27/2023 Target Resolution Date: 01/22/2023 Unmet Reason: no major changes; Goal Status: Unmet continue current [name]. Interventions: Assess patient/caregiver ability to obtain necessary supplies Assess patient/caregiver ability to perform ulcer/skin care regimen upon admission and as needed Assess ulceration(s) every visit Provide education on smoking Provide education on ulcer and skin care Notes: Electronic Signature(s) Signed: 02/16/2023 4:41:34 PM By: Ander Slade,  Ladona Ridgel Entered By: [name] on 02/16/2023 11:23:00 -------------------------------------------------------------------------------- Pain Assessment Details Patient Name: Date of Service: [name], [name] 02/16/2023 10:30 A [name] Medical Record Number: [id number] Patient Account Number: [id number] Date of Birth/Sex: Treating RN: June 20, 1966 (57 y.o. Male) [name] Primary Care Braylynn Lewing: [name] Other Clinician: Referring  [name]: Treating [name] Mabe/Extender: [name]: 7 Active Problems Location of Pain Severity and Description of Pain Patient Has Paino No Site Locations Rate the pain. [name], [name] ([id number]) [id number] Page 5 of 7 Rate the pain. Current Pain Level: 0 Pain Management and Medication Current Pain Management: Electronic Signature(s) Signed: 02/16/2023 4:41:34 PM By: [name] Entered By: [name] on 02/16/2023 11:28:58 -------------------------------------------------------------------------------- Patient/Caregiver Education Details Patient Name: Date of Service: [name], [name] 7/15/2024andnbsp10:30 A [name] Medical Record Number: [id number] Patient Account Number: [id number] Date of Birth/Gender: Treating RN: Dec 04, 1965 (57 y.o. Male) [name] Primary Care Physician: [name] Other Clinician: Referring Physician: Treating Physician/Extender: [name]: 7 Education Assessment Education Provided To: Patient Education Topics Provided Hyperbaric Oxygenation: Methods: Explain/Verbal Responses: Reinforcements needed, State content correctly Electronic Signature(s) Signed: 02/16/2023 4:41:34 PM By: Gelene Mink By: [name] on 02/16/2023 11:23:28 -------------------------------------------------------------------------------- Wound Assessment Details Patient Name: Date of Service: [name], [name] [name] 02/16/2023 10:30 A [name] Medical Record Number: [id number] Patient Account Number: [id number] Date of Birth/Sex: Treating RN: 03-03-1966 (57 y.o. Male) [name] Primary Care Inda Mcglothen: [name] Other Clinician: Referring Fredricka Kohrs: Treating Tahni Porchia/Extender: Dana, Dorner ([id number]) [id number] Page 6 of 7 Weeks in [name]:  7 Wound Status Wound Number: 1 Primary Diabetic Wound/Ulcer of the Lower Extremity Etiology: Wound Location: Left Achilles Secondary Arterial Insufficiency Ulcer Wounding Event: Gradually Appeared Etiology: Date Acquired: 12/07/2022 Wound Open Weeks Of [name]: 7 Status: Clustered Wound: No Comorbid Cataracts, Coronary Artery Disease, Hypertension, Peripheral History: Venous Disease, Type I Diabetes, Rheumatoid Arthritis, Osteoarthritis Photos Wound Measurements Length: (cm) 0.6 Width: (cm) 0.5 Depth: (cm) 0.3 Area: (cm) 0.236 Volume: (cm) 0.071 % Reduction in Area: 53.1% % Reduction in Volume: -42% Epithelialization: None Tunneling: No Undermining: No Wound Description Classification: Grade 2 Wound Margin: Epibole Exudate Amount: Medium Exudate Type: Serous Exudate Color: amber Foul Odor After Cleansing: No Slough/Fibrino Yes Wound Bed Granulation Amount: None Present (0%) Exposed Structure Necrotic Amount: Large (67-100%) Fascia Exposed: No Necrotic Quality: Adherent Slough Fat Layer (Subcutaneous Tissue) Exposed: Yes Tendon Exposed: No Muscle Exposed: No Joint Exposed: No Bone Exposed: No Periwound Skin Texture Texture Color No Abnormalities Noted: No No Abnormalities Noted: No Callus: No Atrophie Blanche: No Crepitus: No Cyanosis: No Excoriation: No Ecchymosis: No Induration: No Erythema: No Rash: No Hemosiderin Staining: No Scarring: No Mottled: No Pallor: No Moisture Rubor: No No Abnormalities Noted: No Dry / Scaly: No Maceration: No [name] Notes Wound #1 (Achilles) Wound Laterality: Left Cleanser Soap and Water Discharge Instruction: May shower and wash wound with dial antibacterial soap and water prior to dressing change. Vashe 5.8 (oz) Discharge Instruction: Cleanse the wound with Vashe prior to applying a clean dressing using gauze sponges, not tissue or cotton balls. BEDFORD, WINSOR ([id number])  [id number] Page 7 of 7 Peri-Wound Care Skin Prep Discharge Instruction: Use skin prep as directed Topical Primary Dressing Hydrofera Blue Ready Transfer Foam, 2.5x2.5 (in/in) Discharge Instruction: Apply directly to wound bed as directed Santyl Ointment Discharge Instruction: Apply nickel thick amount to wound bed as instructed Secondary Dressing Zetuvit Plus Silicone Border Dressing 4x4 (in/in) Discharge Instruction: Apply silicone  border over primary dressing as directed. Secured With Compression Wrap Compression Stockings Facilities manager) Signed: 02/16/2023 4:10:29 PM By: Thayer Dallas Signed: 02/16/2023 4:41:34 PM By: Gelene Mink By: Thayer Dallas on 02/16/2023 11:33:24 -------------------------------------------------------------------------------- Vitals Details Patient Name: Date of Service: [name], [name] [name] 02/16/2023 10:30 A [name] Medical Record Number: [id number] Patient Account Number: [id number] Date of Birth/Sex: Treating RN: February 25, 1966 (57 y.o. Male) Primary Care Ryer Asato: [name] Other Clinician: Referring Ave Scharnhorst: Treating Garnett Nunziata/Extender: [name]: 7 Vital Signs Time Taken: 11:08 Temperature (F): 97.0 Height (in): 68 Pulse (bpm): 64 Weight (lbs): 200 Respiratory Rate (breaths/min): 18 Body Mass Index (BMI): 30.4 Blood Pressure (mmHg): 128/68 Capillary Blood Glucose (mg/dl): 914 Reference Range: 80 - 120 mg / dl Electronic Signature(s) Signed: 02/16/2023 11:26:26 AM By: [name] CHT EMT BS , , Entered By: [name] on 02/16/2023 11:26:26

## 2023-02-17 NOTE — Progress Notes (Addendum)
[name], [name] ([id number]) [id number] Page 1 of 2 Visit Report for 02/17/2023 HBO Details Patient Name: Date of Service: [name], [name] 02/17/2023 7:30 A M Medical Record Number: [id number] Patient Account Number: [id number] Date of Birth/Sex: Treating RN: [name] (57 y.o. Male) [name] Primary Care [name]: [name] Other Clinician: Haywood [name] Referring [name]: Treating [name]/Extender: [name] in Treatment: 7 HBO Treatment Course Details Treatment Course Number: 1 Ordering [name]: [name] T Treatments Ordered: otal 40 HBO Treatment Start Date: 02/16/2023 HBO Indication: Other (specify in Notes) Notes: Acute peripheral arterial insufficiency HBO Treatment Details Treatment Number: 2 Patient Type: Outpatient Chamber Type: Monoplace Chamber Serial #: [id number] Treatment Protocol: 2.0 ATA with 90 minutes oxygen, with two 5 minute air breaks Treatment Details Compression Rate Down: 1.0 psi / minute De-Compression Rate Up: 1.0 psi / minute A breaks and breathing ir Compress Tx Pressure periods Decompress Decompress Begins Reached (leave unused spaces Begins Ends blank) Chamber Pressure (ATA 1 2 2 2 2 2  --2 1 ) Clock Time (24 hr) 08:07 08:27 08:57 09:02 09:32 09:37 - - 10:07 10:22 Treatment Length: 135 (minutes) Treatment Segments: 4 Vital Signs Capillary Blood Glucose Reference Range: 80 - 120 mg / dl HBO Diabetic Blood Glucose Intervention Range: <131 mg/dl or >782 mg/dl Type: Time Vitals Blood Respiratory Capillary Blood Glucose Pulse Action Pulse: Temperature: Taken: Pressure: Rate: Glucose (mg/dl): Meter #: Oximetry (%) Taken: Pre 07:55 129/65 67 18 97.2 143 2 none per protocol Post 10:27 133/61 66 18 98.1 125 1 none per protocol Treatment Response Treatment Toleration: Well Treatment Completion Status: Treatment Completed without Adverse Event Treatment Notes [name] arrived for treatment with blood glucose level of 143 mg/dL stating that he had consumed an egg sandwich around 0630 and self administerd-lantis (17 units) at approximately 0630 this morning. He prepared for treatment. After performing a safety check, he was placed in the chamber which was compressed with 100% oxygen at a rate of 1 psi/min with ventilation on full, effectively causing travel rate to be approximately 0.75 psi/min. He denied any issues with ear equalization and/or pain associated with barotrauma. [name] tolerated the treatment and the subsequent decompression of the chamber at a rate set of 1 psi/min. His post-treatment vital signs were within normal range. He was stable upon discharge. Physician HBO Attestation: I certify that I supervised this HBO treatment in accordance with Medicare guidelines. A trained emergency response team is readily available per Yes hospital policies and procedures. Continue HBOT as ordered. Yes Electronic Signature(s) Signed: 02/17/2023 4:54:40 PM By: [name] Corwin DO Previous Signature: 02/17/2023 11:54:48 AM Version By: [name] CHT EMT BS , , Previous Signature: 02/17/2023 4:51:26 PM Version By: [name] Corwin DO Entered By: [name] on 02/17/2023 16:52:58 [name] ([id number]) [id number] Page 2 of 2 -------------------------------------------------------------------------------- HBO Safety Checklist Details Patient Name: Date of Service: [name], [name] 02/17/2023 7:30 A M Medical Record Number: [id number] Patient Account Number: [id number] Date of Birth/Sex: Treating RN: [name] (57 y.o. Male) [name] Primary Care [name]: [name] Other Clinician: Haywood [name] Referring [name]: Treating [name]/Extender: [name] in Treatment: 7 HBO Safety Checklist Items Safety Checklist Consent Form Signed Patient voided / foley  secured and emptied When did you last eato 0630 Last dose of injectable or oral agent 0630 17 Units Ostomy pouch emptied and vented if applicable NA All implantable devices assessed, documented and approved NA Libre 2 on Approved Items List Intravenous access site  secured and place NA Valuables secured Linens and cotton and cotton/polyester blend (less than 51% polyester) Personal oil-based products / skin lotions / body lotions removed Wigs or hairpieces removed NA Smoking or tobacco materials removed NA Books / newspapers / magazines / loose paper removed Cologne, aftershave, perfume and deodorant removed Jewelry removed (may wrap wedding band) Make-up removed Hair care products removed Libre 2 on Approved Items List, otherwise all Battery operated devices (external) removed electronics removed. Heating patches and chemical warmers removed Titanium eyewear removed Nail polish cured greater than 10 hours Casting material cured greater than 10 hours NA Hearing aids removed NA Loose dentures or partials removed NA Prosthetics have been removed NA Patient demonstrates correct use of air break device (if applicable) Patient concerns have been addressed Patient grounding bracelet on and cord attached to chamber Specifics for Inpatients (complete in addition to above) Medication sheet sent with patient NA Intravenous medications needed or due during therapy sent with patient NA Drainage tubes (e.g. nasogastric tube or chest tube secured and vented) NA Endotracheal or Tracheotomy tube secured NA Cuff deflated of air and inflated with saline NA Airway suctioned NA Notes Paper version used prior to treatment start. Electronic Signature(s) Signed: 02/17/2023 1:05:19 PM By: [name] CHT EMT BS , , Previous Signature: 02/17/2023 10:01:02 AM Version By: [name] CHT EMT BS , , Entered By: [name] on 02/17/2023 13:05:19

## 2023-02-17 NOTE — Progress Notes (Addendum)
KENZEL, RUESCH ([id number]) [id number] Page 1 of 2 Visit Report for 02/17/2023 Arrival Information Details Patient Name: Date of Service: [place], [name] 02/17/2023 7:30 A M Medical Record Number: [id number] Patient Account Number: [id number] Date of Birth/Sex: Treating RN: [birth year] (57 y.o. [name], [name] Primary Care Djimon Lundstrom: [name] Other Clinician: [name] Referring Morning Halberg: Treating Shakir Petrosino/Extender: Donnetta Hail in Treatment: 7 Visit Information History Since Last Visit All ordered tests and consults were completed: Yes Patient Arrived: Ambulatory Added or deleted any medications: No Arrival Time: 07:30 Any new allergies or adverse reactions: No Accompanied By: self Had a fall or experienced change in No Transfer Assistance: None activities of daily living that may affect Patient Identification Verified: Yes risk of falls: Secondary Verification Process Completed: Yes Signs or symptoms of abuse/neglect since last visito No Patient Requires Transmission-Based Precautions: No Hospitalized since last visit: No Patient Has Alerts: No Implantable device outside of the clinic excluding No cellular tissue based products placed in the center since last visit: Pain Present Now: No Electronic Signature(s) Signed: 02/17/2023 9:58:11 AM By: [name] CHT EMT BS , , Entered By: [name] on 02/17/2023 09:58:10 -------------------------------------------------------------------------------- Encounter Discharge Information Details Patient Name: Date of Service: [name], [name] 02/17/2023 7:30 A M Medical Record Number: [id number] Patient Account Number: [id number] Date of Birth/Sex: Treating RN: 1966-03-14 (57 y.o. [name] Primary Care Branon Sabine: [name] Other Clinician: [name] Referring Melodye Swor: Treating Megan Hayduk/Extender: Donnetta Hail in Treatment: 7 Encounter Discharge Information Items Discharge Condition: Stable Ambulatory Status: Ambulatory Discharge Destination: Home Transportation: Private Auto Accompanied By: self Schedule Follow-up Appointment: No Clinical Summary of Care: Electronic Signature(s) Signed: 02/17/2023 12:15:31 PM By: [name] CHT EMT BS , , Entered By: [name] on 02/17/2023 12:15:31 [name] ([id number]) [id number] Page 2 of 2 -------------------------------------------------------------------------------- Vitals Details Patient Name: Date of Service: KRY[place], [name] 02/17/2023 7:30 A M Medical Record Number: [id number] Patient Account Number: [id number] Date of Birth/Sex: Treating RN: 12-May-1966 (57 y.o. [name], [name] Primary Care Shepherd Finnan: [name] Other Clinician: [name] Referring Callee Rohrig: Treating Lorie Cleckley/Extender: Donnetta Hail in Treatment: 7 Vital Signs Time Taken: 07:55 Temperature (F): 97.2 Height (in): 68 Pulse (bpm): 67 Weight (lbs): 200 Respiratory Rate (breaths/min): 18 Body Mass Index (BMI): 30.4 Blood Pressure (mmHg): 129/65 Capillary Blood Glucose (mg/dl): 678 Reference Range: 80 - 120 mg / dl Electronic Signature(s) Signed: 02/17/2023 9:58:44 AM By: [name] CHT EMT BS , , Entered By: [name] on 02/17/2023 09:58:44

## 2023-02-18 ENCOUNTER — Encounter (HOSPITAL_BASED_OUTPATIENT_CLINIC_OR_DEPARTMENT_OTHER): Payer: 59 | Admitting: Physician Assistant

## 2023-02-18 DIAGNOSIS — E1051 Type 1 diabetes mellitus with diabetic peripheral angiopathy without gangrene: Secondary | ICD-10-CM | POA: Diagnosis not present

## 2023-02-18 LAB — GLUCOSE, CAPILLARY
Glucose-Capillary: 154 mg/dL — ABNORMAL HIGH (ref 70–99)
Glucose-Capillary: 127 mg/dL — ABNORMAL HIGH (ref 70–99)

## 2023-02-18 NOTE — Progress Notes (Addendum)
KAYD, LAUNER ([id number]) [id number] Page 1 of 2 Visit Report for 02/18/2023 HBO Details Patient Name: Date of Service: [name], [name] 02/18/2023 7:30 A M Medical Record Number: [id number] Patient Account Number: [id number] Date of Birth/Sex: Treating RN: 1966-04-09 (57 y.o. [name] Primary Care Esgar Barnick: [name] Other Clinician: [name] Referring Sindhu Nguyen: Treating Irasema Chalk/Extender: [name] in Treatment: 7 HBO Treatment Course Details Treatment Course Number: 1 Ordering Magnolia Mattila: Geralyn Corwin T Treatments Ordered: otal 40 HBO Treatment Start Date: 02/16/2023 HBO Indication: Other (specify in Notes) Notes: Acute peripheral arterial insufficiency HBO Treatment Details Treatment Number: 3 Patient Type: Outpatient Chamber Type: Monoplace Chamber Serial #: [id number] Treatment Protocol: 2.0 ATA with 90 minutes oxygen, with two 5 minute air breaks Treatment Details Compression Rate Down: 1.0 psi / minute De-Compression Rate Up: 2.0 psi / minute A breaks and breathing ir Compress Tx Pressure periods Decompress Decompress Begins Reached (leave unused spaces Begins Ends blank) Chamber Pressure (ATA 1 2 2 2 2 2  --2 1 ) Clock Time (24 hr) 07:56 08:15 08:45 08:50 09:20 09:25 - - 09:55 10:20 Treatment Length: 144 (minutes) Treatment Segments: 5 Vital Signs Capillary Blood Glucose Reference Range: 80 - 120 mg / dl HBO Diabetic Blood Glucose Intervention Range: <131 mg/dl or >865 mg/dl Type: Time Vitals Blood Respiratory Capillary Blood Glucose Pulse Action Pulse: Temperature: Taken: Pressure: Rate: Glucose (mg/dl): Meter #: Oximetry (%) Taken: Pre 07:30 116/63 66 18 97.5 154 none per protocol Post 10:24 119/62 65 18 97.8 127 none per protocol Treatment Response Treatment Toleration: Fair Treatment Completion Status: Treatment Completed without Adverse Event Treatment Notes Mr.  Russomanno arrived for treatment with vital signs within normal range. He prepared for treatment. Patient declined Afrin. After performing a safety check, he was placed in the chamber which was compressed at a rate of 1 psi/min with ventilation on full (450 Lpm) to slow the travel rate to approximately 0.75 psi/min. Upon reaching depth patient informed this tech that his right ear equalizes but makes a rattling sound. He tolerated treatment well. During the decompression phase, patient still complained of difficulty equalizing right middle ear. He stated that it does equalize however it is slower than his left ear and it makes a squeak/possible slight pain. During decompression, rate set was on 1 psi/min with ventilation at a slower rate to slow travel rate. Patient left before Etherine Mackowiak could examine the patient's ears as he had an appointment elsewhere following his treatment. He was stable upon discharge. Electronic Signature(s) Signed: 02/18/2023 11:43:34 AM By: [name] CHT EMT BS , , Signed: 02/18/2023 4:26:17 PM By: Allen Derry PA-C Previous Signature: 02/18/2023 8:43:18 AM Version By: [name] CHT EMT BS , , Entered By: [name] on 02/18/2023 11:43:34 Hegwood, [name] ([id number]) [id number] Page 2 of 2 -------------------------------------------------------------------------------- HBO Safety Checklist Details Patient Name: Date of Service: HABIB, [name] 02/18/2023 7:30 A M Medical Record Number: [id number] Patient Account Number: [id number] Date of Birth/Sex: Treating RN: [birth year] (57 y.o. [name] Primary Care Cutler Sunday: [name] Other Clinician: [name] Referring Prinston Kynard: Treating Shandrika Ambers/Extender: [name] in Treatment: 7 HBO Safety Checklist Items Safety Checklist Consent Form Signed Patient voided / foley secured and emptied When did you last eato 0630 Last dose  of injectable or oral agent 0630 Ostomy pouch emptied and vented if applicable NA All implantable devices assessed, documented and approved Libre 2 on Approved Items List Intravenous access site secured and place  NA Valuables secured Linens and cotton and cotton/polyester blend (less than 51% polyester) Personal oil-based products / skin lotions / body lotions removed Wigs or hairpieces removed NA Smoking or tobacco materials removed NA Books / newspapers / magazines / loose paper removed Cologne, aftershave, perfume and deodorant removed Jewelry removed (may wrap wedding band) Make-up removed NA Hair care products removed Battery operated devices (external) removed Heating patches and chemical warmers removed Titanium eyewear removed NA Nail polish cured greater than 10 hours NA Casting material cured greater than 10 hours NA Hearing aids removed NA Loose dentures or partials removed NA Prosthetics have been removed NA Patient demonstrates correct use of air break device (if applicable) Patient concerns have been addressed Patient grounding bracelet on and cord attached to chamber Specifics for Inpatients (complete in addition to above) Medication sheet sent with patient NA Intravenous medications needed or due during therapy sent with patient NA Drainage tubes (e.g. nasogastric tube or chest tube secured and vented) NA Endotracheal or Tracheotomy tube secured NA Cuff deflated of air and inflated with saline NA Airway suctioned NA Notes Paper version used prior to treatment. Electronic Signature(s) Signed: 02/18/2023 8:39:33 AM By: [name] CHT EMT BS , , Entered By: [name] on 02/18/2023 08:39:33

## 2023-02-18 NOTE — Progress Notes (Signed)
DIMITRY, HOLSWORTH ([id number]) [id number] Page 1 of 1 Visit Report for 02/18/2023 SuperBill Details Patient Name: Date of Service: [name], [name] 02/18/2023 Medical Record Number: [id number] Patient Account Number: [id number] Date of Birth/Sex: Treating RN: Dec 06, 1965 (57 y.o. 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 J44.9 Chronic obstructive pulmonary disease, unspecified M06.9 Rheumatoid arthritis, unspecified Z87.891 Personal history of nicotine dependence Facility Procedures CPT4 Code Description Modifier Quantity [id number] G0277-(Facility Use Only) HBOT full body chamber, , 5 ICD-10 Diagnosis Description 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 S91.302A Unspecified open wound, left foot, initial encounter 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 Physician Procedures Quantity CPT4 Code Description Modifier [id number] - WC PHYS HYPERBARIC OXYGEN THERAPY 1 ICD-10 Diagnosis Description 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

## 2023-02-18 NOTE — Progress Notes (Addendum)
FAROUK, VIVERO ([id number]) [id number] Page 1 of 2 Visit Report for 02/18/2023 Arrival Information Details Patient Name: Date of Service: [place], [name] 02/18/2023 7:30 A M Medical Record Number: [id number] Patient Account Number: [id number] Date of Birth/Sex: Treating RN: 18-May-1966 (57 y.o. [name] Primary Care Shermaine Brigham: [name] Other Clinician: [name] Referring Myrle Wanek: Treating Davion Flannery/Extender: [name] in Treatment: 7 Visit Information History Since Last Visit All ordered tests and consults were completed: Yes Patient Arrived: Ambulatory Added or deleted any medications: No Arrival Time: 07:30 Any new allergies or adverse reactions: No Accompanied By: significant other Had a fall or experienced change in No Transfer Assistance: None activities of daily living that may affect Patient Identification Verified: Yes risk of falls: Secondary Verification Process Completed: Yes Signs or symptoms of abuse/neglect since last visito No Patient Requires Transmission-Based Precautions: No Hospitalized since last visit: No Patient Has Alerts: No Implantable device outside of the clinic excluding No cellular tissue based products placed in the center since last visit: Pain Present Now: No Electronic Signature(s) Signed: 02/18/2023 8:37:31 AM By: [name] CHT EMT BS , , Entered By: [name] on 02/18/2023 08:37:31 -------------------------------------------------------------------------------- Encounter Discharge Information Details Patient Name: Date of Service: [name], [name] 02/18/2023 7:30 A M Medical Record Number: [id number] Patient Account Number: [id number] Date of Birth/Sex: Treating RN: Dec 28, 1965 (57 y.o. [name] Primary Care Delshawn Stech: [name] Other Clinician: [name] Referring Ling Flesch: Treating Therin Vetsch/Extender: [name] in Treatment: 7 Encounter Discharge Information Items Discharge Condition: Stable Ambulatory Status: Ambulatory Discharge Destination: Home Transportation: Private Auto Accompanied By: significant other Schedule Follow-up Appointment: No Clinical Summary of Care: Electronic Signature(s) Signed: 02/18/2023 1:06:33 PM By: [name] CHT EMT BS , , Entered By: [name] on 02/18/2023 13:06:33 Medel, [name] ([id number]) [id number] Page 2 of 2 -------------------------------------------------------------------------------- Vitals Details Patient Name: Date of Service: [place], [name] 02/18/2023 7:30 A M Medical Record Number: [id number] Patient Account Number: [id number] Date of Birth/Sex: Treating RN: 1965-10-03 (57 y.o. [name] Primary Care Taneya Conkel: [name] Other Clinician: [name] Referring Israel Wunder: Treating Bailee Metter/Extender: [name] in Treatment: 7 Vital Signs Time Taken: 07:30 Temperature (F): 97.5 Height (in): 68 Pulse (bpm): 66 Weight (lbs): 200 Respiratory Rate (breaths/min): 18 Body Mass Index (BMI): 30.4 Blood Pressure (mmHg): 116/63 Capillary Blood Glucose (mg/dl): 176 Reference Range: 80 - 120 mg / dl Electronic Signature(s) Signed: 02/18/2023 8:38:05 AM By: [name] CHT EMT BS , , Entered By: [name] on 02/18/2023 08:38:05

## 2023-02-19 ENCOUNTER — Ambulatory Visit: Payer: 59 | Admitting: Orthopedic Surgery

## 2023-02-19 ENCOUNTER — Encounter (HOSPITAL_BASED_OUTPATIENT_CLINIC_OR_DEPARTMENT_OTHER): Payer: 59 | Admitting: Internal Medicine

## 2023-02-19 DIAGNOSIS — E1051 Type 1 diabetes mellitus with diabetic peripheral angiopathy without gangrene: Secondary | ICD-10-CM | POA: Diagnosis not present

## 2023-02-19 DIAGNOSIS — L97421 Non-pressure chronic ulcer of left heel and midfoot limited to breakdown of skin: Secondary | ICD-10-CM

## 2023-02-19 LAB — GLUCOSE, CAPILLARY
Glucose-Capillary: 189 mg/dL — ABNORMAL HIGH (ref 70–99)
Glucose-Capillary: 189 mg/dL — ABNORMAL HIGH (ref 70–99)

## 2023-02-19 NOTE — Progress Notes (Addendum)
PAYSON, EVRARD ([id number]) [id number] Page 1 of 2 Visit Report for 02/19/2023 Arrival Information Details Patient Name: Date of Service: [place], [name] 02/19/2023 7:30 A M Medical Record Number: [id number] Patient Account Number: [id number] Date of Birth/Sex: Treating RN: [birth year] (57 y.o. [name], [name] Primary Care Doyel Mulkern: [name] Other Clinician: [name] Referring Mihira Tozzi: Treating Kayzlee Wirtanen/Extender: Theodis Sato in Treatment: 8 Visit Information History Since Last Visit All ordered tests and consults were completed: Yes Patient Arrived: Ambulatory Added or deleted any medications: No Arrival Time: 07:44 Any new allergies or adverse reactions: No Accompanied By: self Had a fall or experienced change in No Transfer Assistance: None activities of daily living that may affect Patient Identification Verified: Yes risk of falls: Secondary Verification Process Completed: Yes Signs or symptoms of abuse/neglect since last visito No Patient Requires Transmission-Based Precautions: No Hospitalized since last visit: No Patient Has Alerts: No Implantable device outside of the clinic excluding No cellular tissue based products placed in the center since last visit: Pain Present Now: No Electronic Signature(s) Signed: 02/19/2023 11:51:39 AM By: [name] CHT EMT BS , , Entered By: [name] on 02/19/2023 11:51:39 -------------------------------------------------------------------------------- Encounter Discharge Information Details Patient Name: Date of Service: [name], [name] 02/19/2023 7:30 A M Medical Record Number: [id number] Patient Account Number: [id number] Date of Birth/Sex: Treating RN: 18-Dec-1965 (57 y.o. [name] Primary Care Myonna Chisom: [name] Other Clinician: [name] Referring Andron Marrazzo: Treating Kadajah Kjos/Extender: Theodis Sato in Treatment: 8 Encounter Discharge Information Items Discharge Condition: Stable Ambulatory Status: Ambulatory Discharge Destination: Home Transportation: Private Auto Accompanied By: self Schedule Follow-up Appointment: No Clinical Summary of Care: Electronic Signature(s) Signed: 02/19/2023 1:27:47 PM By: [name] CHT EMT BS , , Entered By: [name] on 02/19/2023 13:27:47 Reither, [name] ([id number]) [id number] Page 2 of 2 -------------------------------------------------------------------------------- Vitals Details Patient Name: Date of Service: [place], [name] 02/19/2023 7:30 A M Medical Record Number: [id number] Patient Account Number: [id number] Date of Birth/Sex: Treating RN: July 26, 1966 (57 y.o. [name], [name] Primary Care Rosalene Wardrop: [name] Other Clinician: [name] Referring Darivs Lunden: Treating Pamlea Finder/Extender: Theodis Sato in Treatment: 8 Vital Signs Time Taken: 08:07 Temperature (F): 97.9 Height (in): 68 Pulse (bpm): 77 Weight (lbs): 200 Respiratory Rate (breaths/min): 18 Body Mass Index (BMI): 30.4 Blood Pressure (mmHg): 137/58 Capillary Blood Glucose (mg/dl): 660 Reference Range: 80 - 120 mg / dl Electronic Signature(s) Signed: 02/19/2023 11:52:29 AM By: [name] CHT EMT BS , , Entered By: [name] on 02/19/2023 11:52:28

## 2023-02-19 NOTE — Progress Notes (Signed)
SALAM, MICUCCI ([id number]) [id number] Page 1 of 2 Visit Report for 02/19/2023 HBO Details Patient Name: Date of Service: [place], [name] 02/19/2023 7:30 A M Medical Record Number: [id number] Patient Account Number: [id number] Date of Birth/Sex: Treating RN: 11/18/1965 (57 y.o. [name], [name] Primary Care Bao Coreas: [name] Other Clinician: [name] Referring Azlan Hanway: Treating Laysa Kimmey/Extender: Theodis Sato in Treatment: 8 HBO Treatment Course Details Treatment Course Number: 1 Ordering Grier Czerwinski: Geralyn Corwin T Treatments Ordered: otal 40 HBO Treatment Start Date: 02/16/2023 HBO Indication: Other (specify in Notes) Notes: Acute peripheral arterial insufficiency HBO Treatment Details Treatment Number: 4 Patient Type: Outpatient Chamber Type: Monoplace Chamber Serial #: [id number] Treatment Protocol: 2.0 ATA with 90 minutes oxygen, with two 5 minute air breaks Treatment Details Compression Rate Down: 1.0 psi / minute De-Compression Rate Up: 1.0 psi / minute A breaks and breathing ir Compress Tx Pressure periods Decompress Decompress Begins Reached (leave unused spaces Begins Ends blank) Chamber Pressure (ATA 1 2 2 2 2 2  --2 1 ) Clock Time (24 hr) 08:21 08:40 09:10 09:15 09:45 09:50 - - 10:20 10:35 Treatment Length: 134 (minutes) Treatment Segments: 4 Vital Signs Capillary Blood Glucose Reference Range: 80 - 120 mg / dl HBO Diabetic Blood Glucose Intervention Range: <131 mg/dl or >086 mg/dl Type: Time Vitals Blood Respiratory Capillary Blood Glucose Pulse Action Pulse: Temperature: Taken: Pressure: Rate: Glucose (mg/dl): Meter #: Oximetry (%) Taken: Pre 08:07 137/58 77 18 97.9 189 none per protocol Post 10:37 130/63 66 18 98.6 189 none per protocol Treatment Response Treatment Toleration: Well Treatment Completion Status: Treatment Completed without Adverse Event Treatment Notes Mr. Audino  arrived for treatment with normal vital signs. He prepared for treatment. Dr. Leanord Hawking examined his ears as patient was explaining difficulty equalizing in the right ear. Patient self-administered Afrin. After performing a safety check, he was placed in the chamber which was compressed at a rate of 1 psi/min on full ventilation to slow the rate to approximately 0.75 psi/min. Continued to educate patient that if he needs to stop travel to accommodate ear equalization. He states that ears are clearing, however, the right ear equalizes slower. He tolerated the treatment and the subsequent decompression of the chamber at the same 1 psi/min to accommodate ear equalization. He denies pain to the ears, stating again that the right ear just equalizes slower and with more effort. Post treatment vital signs were within normal range. He was stable upon discharge. Jahkeem Kurka Notes No concerns with treatment given Physician HBO Attestation: I certify that I supervised this HBO treatment in accordance with Medicare guidelines. A trained emergency response team is readily available per Yes hospital policies and procedures. Continue HBOT as ordered. Yes Electronic Signature(s) Signed: 02/23/2023 10:28:49 AM By: [name] MD Previous Signature: 02/19/2023 1:04:54 PM Version By: [name] CHT EMT BS , , ALP, GOLDWATER ([id number]) [id number] Page 2 of 2 Entered By: [name] on 02/23/2023 10:26:21 -------------------------------------------------------------------------------- HBO Safety Checklist Details Patient Name: Date of Service: [place], [name] 02/19/2023 7:30 A M Medical Record Number: [id number] Patient Account Number: [id number] Date of Birth/Sex: Treating RN: Dec 30, 1965 (57 y.o. [name] Primary Care Lucee Brissett: [name] Other Clinician: [name] Referring Darion Milewski: Treating Lennan Malone/Extender: Theodis Sato in Treatment: 8 HBO Safety Checklist Items Safety Checklist Consent Form Signed Patient voided / foley secured and emptied When did you last eato 0700- SEC biscuit Last dose of injectable or oral agent 0630 17U Long Acting Ostomy pouch  emptied and vented if applicable NA All implantable devices assessed, documented and approved NA Libre 2 and 3 on Approved Items List Intravenous access site secured and place NA Valuables secured Linens and cotton and cotton/polyester blend (less than 51% polyester) Personal oil-based products / skin lotions / body lotions removed Wigs or hairpieces removed NA Smoking or tobacco materials removed NA Books / newspapers / magazines / loose paper removed Cologne, aftershave, perfume and deodorant removed Jewelry removed (may wrap wedding band) Make-up removed Hair care products removed Libre Freestyle 2 and 3 Sensor Approved, other Battery operated devices (external) removed electronics removed. Heating patches and chemical warmers removed Titanium eyewear removed Nail polish cured greater than 10 hours NA Casting material cured greater than 10 hours NA Hearing aids removed NA Loose dentures or partials removed NA Prosthetics have been removed NA Patient demonstrates correct use of air break device (if applicable) Patient concerns have been addressed Patient grounding bracelet on and cord attached to chamber Specifics for Inpatients (complete in addition to above) Medication sheet sent with patient NA Intravenous medications needed or due during therapy sent with patient NA Drainage tubes (e.g. nasogastric tube or chest tube secured and vented) NA Endotracheal or Tracheotomy tube secured NA Cuff deflated of air and inflated with saline NA Airway suctioned NA Notes Paper version used prior to treatment start. Electronic Signature(s) Signed: 02/25/2023 4:13:49 PM By: [name] CHT EMT BS , , Previous  Signature: 02/20/2023 3:37:54 PM Version By: [name] CHT EMT BS , , Previous Signature: 02/19/2023 12:13:16 PM Version By: [name] CHT EMT BS , , Entered By: [name] on 02/25/2023 16:13:49

## 2023-02-20 ENCOUNTER — Encounter: Payer: Self-pay | Admitting: Orthopedic Surgery

## 2023-02-20 ENCOUNTER — Telehealth: Payer: Self-pay | Admitting: Orthopedic Surgery

## 2023-02-20 ENCOUNTER — Encounter (HOSPITAL_BASED_OUTPATIENT_CLINIC_OR_DEPARTMENT_OTHER): Payer: 59 | Admitting: Internal Medicine

## 2023-02-20 DIAGNOSIS — J449 Chronic obstructive pulmonary disease, unspecified: Secondary | ICD-10-CM | POA: Diagnosis not present

## 2023-02-20 DIAGNOSIS — E10621 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: Secondary | ICD-10-CM

## 2023-02-20 DIAGNOSIS — L97528 Non-pressure chronic ulcer of other part of left foot with other specified severity: Secondary | ICD-10-CM

## 2023-02-20 DIAGNOSIS — I70245 Atherosclerosis of native arteries of left leg with ulceration of other part of foot: Secondary | ICD-10-CM | POA: Diagnosis not present

## 2023-02-20 DIAGNOSIS — E1051 Type 1 diabetes mellitus with diabetic peripheral angiopathy without gangrene: Secondary | ICD-10-CM | POA: Diagnosis not present

## 2023-02-20 NOTE — Progress Notes (Signed)
Office Visit Note   Patient: [name]           Date of Birth: [birth year]           MRN: [id number] Visit Date: 02/19/2023              Requested by: Garlan Fillers, MD [address] [place],  [place] [address] PCP: Garlan Fillers, MD  Chief Complaint  Patient presents with   Left Foot - Follow-up      HPI: Progressive ulcer left achilles. Patient has been to the wound center for hyperbaric treatment. He has no revascularization options. Wound has gotten deeper with conservative cars  Assessment & Plan: Visit Diagnoses:  1. Heel ulcer, left, limited to breakdown of skin [facility])     Plan: Plan for surgical excision of the necrotic achilles, tissue graft, placement of wound VAC  Follow-Up Instructions: Return in about 2 weeks (around 03/05/2023).   Ortho Exam  Patient is alert, oriented, no adenopathy, well-dressed, normal affect, normal respiratory effort. Examination the wound now extends down to necrotic achilles, no ascending cellulitis. He has stopped smoking. Ulcer measures 1 cm diameter and 5 mm deep.  Imaging: No results found. No images are attached to the encounter.  Labs: Lab Results  Component Value Date   HGBA1C 7.4 (H) 04/25/2014   HGBA1C 7.4 (H) 04/25/2014   ESRSEDRATE 9 09/26/2019   ESRSEDRATE 12 09/09/2017   ESRSEDRATE 58 (H) 06/01/2014   REPTSTATUS 10/08/2020 FINAL 10/03/2020   GRAMSTAIN  10/03/2020    FEW WBC PRESENT, PREDOMINANTLY MONONUCLEAR NO ORGANISMS SEEN    CULT  10/03/2020    NO ANAEROBES ISOLATED Performed at [facility], [address] [place], [place] [address]      Lab Results  Component Value Date   ALBUMIN 3.7 03/15/2018   ALBUMIN 3.7 09/09/2017   ALBUMIN 4.2 03/30/2017    Lab Results  Component Value Date   MG 2.3 04/28/2014   MG 2.4 04/28/2014   MG 2.7 (H) 04/27/2014   Lab Results  Component Value Date   VD25OH 57 06/22/2019    No results found for: "PREALBUMIN"    Latest Ref Rng &  Units 01/15/2023    1:35 PM 04/01/2021    3:37 PM 10/03/2020    6:45 AM  CBC EXTENDED  WBC 4.0 - 10.5 K/uL   12.9   RBC 4.22 - 5.81 MIL/uL   3.79   Hemoglobin 13.0 - 17.0 g/dL 47.4  25.9  56.3   HCT 39.0 - 52.0 % 38.0  33.9  36.6   Platelets 150 - 400 K/uL   275      There is no height or weight on file to calculate BMI.  Orders:  No orders of the defined types were placed in this encounter.  No orders of the defined types were placed in this encounter.    Procedures: No procedures performed  Clinical Data: No additional findings.  ROS:  All other systems negative, except as noted in the HPI. Review of Systems  Objective: Vital Signs: There were no vitals taken for this visit.  Specialty Comments:  No specialty comments available.  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 03/22/2020   Osteopenia of multiple sites 02/28/2020   PVD (peripheral vascular disease) (HCC) 10/11/2019   Chronic venous  insufficiency 10/05/2018   Diabetic cataract (HCC) 05/21/2018   Controlled type 1 diabetes mellitus with diabetic peripheral angiopathy without gangrene (HCC) 08/14/2017   Dyslipidemia 03/26/2017   High risk medication use 09/23/2016   History of hypertension 09/23/2016   History of chronic kidney disease 09/23/2016   History of coronary artery disease 09/23/2016   Tobacco abuse 09/23/2016   Primary osteoarthritis of both knees 09/23/2016   History of diabetes mellitus 09/23/2016   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 (HCC) 11/20/2015   Chest pain with high risk for cardiac etiology 10/29/2015   Asymptomatic bilateral carotid artery stenosis 06/08/2014   Pleural effusion, bilateral 06/03/2014   Dyspnea  06/01/2014   Diabetes (HCC) 05/03/2014   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/03/2014   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   Chronic renal disease, stage 3, moderately decreased glomerular filtration rate between 30-59 mL/min/1.73 square meter (HCC) 05/03/2014   CAD (coronary artery disease) 04/27/2014   Acne 12/19/2013   On isotretinoin therapy 12/19/2013   Nuclear sclerotic cataract, bilateral 12/19/2011   Vitreous hemorrhage (HCC) 06/16/2011   Past Medical History:  Diagnosis Date   Anginal pain (HCC)    Anxiety    Arthritis    RA IN HANDS   Asthma    CAD (coronary artery disease) 04/27/2014   Chronic renal disease, stage 3, moderately decreased glomerular filtration rate between 30-59 mL/min/1.73 square meter (HCC) 05/03/2014   COPD (chronic obstructive pulmonary disease) (HCC)    Coronary artery disease    Diabetes (HCC) 05/03/2014   Diabetes mellitus without complication (HCC)    type 1   Hyperlipidemia    Left shoulder pain    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/03/2014   Shortness of breath    Sleep apnea    mild OSA, no CPAP   Unstable angina pectoris (HCC) 04/25/2014    Family History  Problem Relation Age of Onset   Stomach cancer Mother    Cancer Mother    Prostate cancer Father    Heart disease Father    Cancer - Prostate Father    Heart disease Brother    Asthma Daughter    Healthy Daughter    Asthma Daughter     Past Surgical History:  Procedure Laterality Date   ABDOMINAL AORTOGRAM W/LOWER EXTREMITY N/A 01/15/2023   Procedure: ABDOMINAL AORTOGRAM W/LOWER EXTREMITY;  Surgeon: Cephus Shelling, MD;  Location: [facility] INVASIVE CV LAB;  Service: Cardiovascular;  Laterality: N/A;   CARDIAC CATHETERIZATION  04/25/2014   BY DR Jacinto Halim   CARDIAC CATHETERIZATION N/A 10/30/2015   Procedure: Left Heart Cath and Cors/Grafts Angiography;  Surgeon: Yates Decamp, MD;  Location: [facility] INVASIVE CV LAB;  Service: Cardiovascular;  Laterality: N/A;   CARPAL  TUNNEL RELEASE     CATARACT EXTRACTION, BILATERAL     CORONARY ARTERY BYPASS GRAFT N/A 04/27/2014   Procedure: CORONARY ARTERY BYPASS GRAFTING on pump using left internal mammary artery to LAD coronary artery, right great saphenous vein graft to diagonal coronary artery with sequential to OM1 and circumflex coronary arteries. Right greater saphenous vein graft to posterior descending coronary artery. ;  Surgeon: Delight Ovens, MD;  Location: [facility] OR;  Service: Open Heart Surgery;  Laterality: N   elbow drained Left 05/09/2019   ENDOVEIN HARVEST OF GREATER SAPHENOUS VEIN Right 04/27/2014   Procedure: ENDOVEIN HARVEST OF GREATER SAPHENOUS VEIN;  Surgeon: Delight Ovens, MD;  Location: [facility] OR;  Service: Open Heart Surgery;  Laterality: Right;   EXCISION ORAL TUMOR N/A 03/01/2018   Procedure: EXCISION ORAL TUMOR;  Surgeon: Christia Reading, MD;  Location: [facility];  Service: ENT;  Laterality: N/A;   EYE SURGERY     LASER   EYE SURGERY Bilateral    astigmatism correction    HARDWARE REMOVAL Left 10/03/2020   Procedure: LEFT FOOT REMOVAL HARDWARE, PLACE VANC POWDER;  Surgeon: Nadara Mustard, MD;  Location: [facility] OR;  Service: Orthopedics;  Laterality: Left;   INTRAOPERATIVE TRANSESOPHAGEAL ECHOCARDIOGRAM N/A 04/27/2014   Procedure: INTRAOPERATIVE TRANSESOPHAGEAL ECHOCARDIOGRAM;  Surgeon: Delight Ovens, MD;  Location: [facility] OR;  Service: Open Heart Surgery;  Laterality: N/A;   IR RADIOLOGIST EVAL & MGMT  12/22/2022   LEFT HEART CATHETERIZATION WITH CORONARY ANGIOGRAM N/A 04/25/2014   Procedure: LEFT HEART CATHETERIZATION WITH CORONARY ANGIOGRAM;  Surgeon: Pamella Pert, MD;  Location: [facility] CATH LAB;  Service: Cardiovascular;  Laterality: N/A;   MOUTH SURGERY  11/15/2017   tongue surgery    ORIF TOE FRACTURE Left 03/22/2020   Procedure: OPEN REDUCTION INTERNAL FIXATION (ORIF) Non Union 5th Metatarsal;  Surgeon: Kathryne Hitch, MD;  Location: [facility];  Service:  Orthopedics;  Laterality: Left;   Social History   Occupational History   Not on file  Tobacco Use   Smoking status: Some Days    Current packs/day: 0.25    Average packs/day: 0.3 packs/day for 34.0 years (8.5 ttl pk-yrs)    Types: Cigarettes   Smokeless tobacco: Never  Vaping Use   Vaping status: Never Used  Substance and Sexual Activity   Alcohol use: Yes    Alcohol/week: 0.0 standard drinks of alcohol    Comment: RARE   Drug use: No   Sexual activity: Not on file

## 2023-02-20 NOTE — Telephone Encounter (Signed)
[name] called in to check on the status of his surgery being scheduled for next week.  I do not have a surgery sheet.  He states that his employer needs to know when he is having surgery and how long he is going to be out.  Call back # is [phone].

## 2023-02-20 NOTE — Telephone Encounter (Signed)
Blue sheet for achilles debridement and dictation

## 2023-02-22 ENCOUNTER — Other Ambulatory Visit: Payer: Self-pay | Admitting: Acute Care

## 2023-02-22 DIAGNOSIS — F17210 Nicotine dependence, cigarettes, uncomplicated: Secondary | ICD-10-CM

## 2023-02-22 DIAGNOSIS — Z122 Encounter for screening for malignant neoplasm of respiratory organs: Secondary | ICD-10-CM

## 2023-02-22 DIAGNOSIS — Z87891 Personal history of nicotine dependence: Secondary | ICD-10-CM

## 2023-02-22 NOTE — Progress Notes (Signed)
[name], [name] ([id number]) [id number] Page 1 of 7 Visit Report for 02/20/2023 Arrival Information Details Patient Name: Date of Service: [place], [name] 02/20/2023 8:45 A M Medical Record Number: [id number] Patient Account Number: [id number] Date of Birth/Sex: Treating RN: Feb 14, 1966 (57 y.o. [name], [name] Primary Care [name]: [name] Other Clinician: Referring [name]: Treating [name]/Extender: [name] in Treatment: 8 Visit Information History Since Last Visit Added or deleted any medications: No Patient Arrived: Ambulatory Any new allergies or adverse reactions: No Arrival Time: 09:29 Had a fall or experienced change in No Accompanied By: self activities of daily living that may affect Transfer Assistance: None risk of falls: Patient Identification Verified: Yes Signs or symptoms of abuse/neglect since last visito No Secondary Verification Process Completed: Yes Hospitalized since last visit: No Patient Requires Transmission-Based Precautions: No Implantable device outside of the clinic excluding No Patient Has Alerts: No cellular tissue based products placed in the center since last visit: Has Dressing in Place as Prescribed: Yes Pain Present Now: No Notes documenting late due to downtime on Friday, unable to upload photos, used downtime forms. Patient here this morning, missed his HBO treatment, wanted to see Dr. Mikey [name] to discuss the conversation patient had Thursday with Dr. [name] about surgery, possibility of loosing his foot if no surgery, HBO does not work, and needs to hear from Dr. Mikey [name]. Electronic Signature(s) Signed: 02/22/2023 1:54:09 PM By: [name] Stall RN, BSN Entered By: [name] on 02/22/2023 11:50:00 -------------------------------------------------------------------------------- Encounter Discharge Information Details Patient Name: Date of Service: [name] Pont, PA [name] J.  02/20/2023 8:45 A M Medical Record Number: [id number] Patient Account Number: [id number] Date of Birth/Sex: Treating RN: 23-Oct-1965 (57 y.o. [name] Primary Care [name]: [name] Other Clinician: Referring Vash Quezada: Treating Effa Yarrow/Extender: [name] in Treatment: 8 Encounter Discharge Information Items Post Procedure Vitals Discharge Condition: Stable Temperature (F): 98.2 Ambulatory Status: Ambulatory Pulse (bpm): 63 Discharge Destination: Home Respiratory Rate (breaths/min): 20 Transportation: Private Auto Blood Pressure (mmHg): 117/66 Accompanied By: self Schedule Follow-up Appointment: Yes Clinical Summary of Care: Electronic Signature(s) Signed: 02/22/2023 1:54:09 PM By: [name] Stall RN, BSN Entered By: [name] on 02/22/2023 11:56:58 Boys, [name] ([id number]) [id number] Page 2 of 7 -------------------------------------------------------------------------------- Lower Extremity Assessment Details Patient Name: Date of Service: [name], [name] 02/20/2023 8:45 A M Medical Record Number: [id number] Patient Account Number: [id number] Date of Birth/Sex: Treating RN: 05/17/1966 (57 y.o. [name] Primary Care Cecil Bixby: [name] Other Clinician: Referring [name]: Treating [name]/Extender: [name] in Treatment: 8 Edema Assessment Assessed: [name]: Yes] [name]: No] Edema: [Left: N] [Right: o] Calf Left: Right: Point of Measurement: 32 cm From Medial Instep 37 cm Ankle Left: Right: Point of Measurement: 13 cm From Medial Instep 21.2 cm Vascular Assessment Pulses: Dorsalis Pedis Palpable: [Left:Yes] Extremity colors, hair growth, and conditions: Extremity Color: [Left:Normal] Hair Growth on Extremity: [Left:Yes] Temperature of Extremity: [Left:Warm] Capillary Refill: [Left:< 3 seconds] Dependent Rubor: [Left:No] Blanched when  Elevated: [Left:No No] Toe Nail Assessment Left: Right: Thick: No Discolored: No Deformed: No Improper Length and Hygiene: No Electronic Signature(s) Signed: 02/22/2023 1:54:09 PM By: [name] Stall RN, BSN Entered By: [name] on 02/22/2023 11:50:57 -------------------------------------------------------------------------------- Multi Wound Chart Details Patient Name: Date of Service: [name] Pont, PA [name] J. 02/20/2023 8:45 A M Medical Record Number: [id number] Patient Account Number: [id number] Date of Birth/Sex: Treating RN: 1966/03/06 (57 y.o. 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 Secondary Etiology: Cataracts, Coronary Artery Disease, N/A Comorbid History:  Hypertension, Peripheral Venous Disease, Type I Diabetes, Rheumatoid Arthritis, Osteoarthritis 12/07/2022 Date Acquired: 8 Weeks of Treatment: Open Wound Status: No Wound Recurrence: 0.5x0.7x0.3 Measurements L x W x D (cm) 0.275 A (cm) : rea 0.082 Volume  (cm) : 45.30% % Reduction in A rea: -64.00% % Reduction in Volume: Grade 2 Classification: Medium Exudate A mount: Serous Exudate Type: amber Exudate Color: Epibole Wound Margin: Large (67-100%) Granulation A mount: Pink, Pale Granulation Quality: Small  (1-33%) Necrotic A mount: Fat Layer (Subcutaneous Tissue): Yes N/A Exposed Structures: Tendon: Yes Fascia: No Muscle: No Joint: No Bone: No None Epithelialization:  Chemical/Enzymatic/Mechanical Debridement: N/A Instrument: None Bleeding: Debridement  Treatment Response: Procedure was tolerated well Post Debridement Measurements L x 0.5x0.7x0.3 W x D (cm) 0.082 Post Debridement Volume: (cm) Excoriation: No Periwound Skin Texture: Induration: No Callus: No Crepitus: No Rash: No Scarring: No Maceration:  Yes Periwound Skin Moisture: Dry/Scaly: No Atrophie Blanche: No Periwound Skin Color: Cyanosis: No Ecchymosis: No Erythema: No Hemosiderin Staining: No Mottled: No Pallor: No Rubor: No Debridement Procedures Performed:] [N/A:N/A N/A N/A N/A N/A N/A N/A  N/A N/A N/A N/A N/A N/A N/A N/A N/A N/A N/A N/A N/A N/A N/A N/A N/A N/A N/A N/A N/A N/A N/A N/A N/A N/A] Treatment Notes Wound #1 (Achilles) Wound Laterality: Left Cleanser Soap and Water Discharge Instruction: May shower and wash wound with dial antibacterial soap and water prior to dressing change. Vashe 5.8 (oz) Discharge Instruction: Cleanse the wound with Vashe prior to applying a clean dressing using gauze sponges, not tissue or cotton balls. Peri-Wound Care Skin Prep Discharge Instruction: Use skin prep as directed Topical Primary Dressing BRAYANT, DORR ([id number]) [id number] Page 4 of 7 Hydrofera Blue Ready Transfer Foam, 2.5x2.5 (in/in) Discharge Instruction: Apply directly to wound bed as directed Santyl Ointment Discharge Instruction: Apply nickel thick amount to wound bed as instructed Secondary Dressing Zetuvit Plus Silicone Border Dressing 4x4 (in/in) Discharge Instruction: Apply silicone border over primary dressing as directed. Secured With Compression Wrap Compression Stockings Facilities manager) Signed: 02/24/2023 4:26:53 PM By: Geralyn Corwin DO Entered By: Geralyn Corwin on 02/24/2023 12:24:34 -------------------------------------------------------------------------------- Multi-Disciplinary Care Plan Details Patient Name: Date of  Service: [name] Pont, [place] [name] J. 02/20/2023 8:45 A M Medical Record Number: [id number] Patient Account Number: [id number] Date of Birth/Sex: Treating RN: [birth year] (57 y.o. [name], [name] Primary Care Marlette Curvin: [name] Other Clinician: Referring Courtnay Petrilla: Treating Froylan Hobby/Extender: [name] in Treatment: 8 Active Inactive HBO Nursing Diagnoses: Potential for barotraumas to ears, sinuses, teeth, and lungs or cerebral gas embolism related to changes in atmospheric pressure inside hyperbaric oxygen chamber Potential for oxygen toxicity seizures related to delivery of 100% oxygen at an increased atmospheric pressure Potential for pulmonary oxygen toxicity related to delivery of 100% oxygen at an increased atmospheric pressure Goals: Patient and/or family will be able to state/discuss factors appropriate to the management of their disease process during treatment Date Initiated: 02/16/2023 Target Resolution Date: 04/10/2023 Goal Status: Active Patient will tolerate the hyperbaric oxygen therapy treatment Date Initiated: 02/16/2023 Target Resolution Date: 04/10/2023 Goal Status: Active Patient/caregiver will verbalize understanding of HBO goals, rationale,  procedures and potential hazards Date Initiated: 02/16/2023 Target Resolution Date: 04/10/2023 Goal Status: Active Interventions: Administer the correct therapeutic gas delivery based on the patients needs and limitations, per physician order Assess and provide for patients comfort related to the hyperbaric environment and equalization of middle ear Assess for signs and symptoms related to adverse events, including but not limited to confinement anxiety, pneumothorax, oxygen toxicity and baurotrauma Assess patient for any history of confinement anxiety Assess patient's knowledge and expectations regarding hyperbaric medicine and provide education related to the hyperbaric environment, goals of treatment  and prevention of adverse events Implement protocols to decrease risk of pneumothorax in high risk patients Notes: Wound/Skin JOSSIAH, SMOAK ([id number]) [id number] Page 5 of 7 Nursing Diagnoses: Impaired tissue integrity Knowledge deficit related to smoking impact on wound healing Knowledge deficit related to ulceration/compromised skin integrity Goals: Patient will have a decrease in wound volume by X% from date: (specify in notes) Date Initiated: 12/25/2022 Target Resolution Date: 04/02/2023 Goal Status: Active Patient/caregiver will verbalize understanding of skin care regimen Date Initiated: 12/25/2022 Target Resolution Date: 04/03/2023 Goal Status: Active Ulcer/skin breakdown will have a volume reduction of 30% by week 4 Date Initiated: 12/25/2022 Date Inactivated: 01/27/2023 Target Resolution Date: 01/22/2023 Unmet Reason: no major changes; Goal Status: Unmet continue current treatment. Interventions: Assess patient/caregiver ability to obtain necessary supplies Assess patient/caregiver ability to perform ulcer/skin care regimen upon admission and as needed Assess ulceration(s) every visit Provide education on smoking Provide education on ulcer and skin care Notes: Electronic Signature(s) Signed: 02/22/2023 1:54:09 PM By: [name] Stall RN, BSN Entered By: [name] on 02/22/2023 11:55:43 -------------------------------------------------------------------------------- Pain Assessment Details Patient Name: Date of Service: [name] Pont, PA [name] J. 02/20/2023 8:45 A M Medical Record Number: [id number] Patient Account Number: [id number] Date of Birth/Sex: Treating RN: [birth year] (57 y.o. [name] Primary Care Elijah Phommachanh: [name] Other Clinician: Referring Arwilda Georgia: Treating Dayja Loveridge/Extender: [name] in Treatment: 8 Active Problems Location of Pain Severity and Description of Pain Patient  Has Paino No Site Locations Pain Management and Medication Current Pain Management: ORESTES, GEIMAN ([id number]) [id number] Page 6 of 7 Electronic Signature(s) Signed: 02/22/2023 1:54:09 PM By: [name] Stall RN, BSN Entered By: [name] on 02/22/2023 11:50:19 -------------------------------------------------------------------------------- Patient/Caregiver Education Details Patient Name: Date of Service: [name] Pont, PA [name] J. 7/19/2024andnbsp8:45 A M Medical Record Number: [id number] Patient Account Number: [id number] Date of Birth/Gender: Treating RN: [birth year] (57 y.o. [name] Primary Care Physician: [name] Other Clinician: Referring Physician: Treating Physician/Extender: [name] in Treatment: 8 Education Assessment Education Provided To: Patient Education Topics Provided Wound/Skin Impairment: Handouts: Caring for Your Ulcer Methods: Explain/Verbal Responses: Reinforcements needed Electronic Signature(s) Signed: 02/22/2023 1:54:09 PM By: [name] Stall RN, BSN Entered By: [name] on 02/22/2023 11:55:58 -------------------------------------------------------------------------------- Wound Assessment Details Patient Name: Date of Service: [name] Pont, PA [name] J. 02/20/2023 8:45 A M Medical Record Number: [id number] Patient Account Number: [id number] Date of Birth/Sex: Treating RN: 04-23-1966 (57 y.o. [name], [name] Primary Care Shayne Deerman: [name] Other Clinician: Referring Larysa Pall: Treating Kattie Santoyo/Extender: [name] in Treatment: 8 Wound Status Wound Number: 1 Primary Diabetic Wound/Ulcer of the Lower Extremity Etiology: Wound Location: Left Achilles Secondary Arterial Insufficiency Ulcer Wounding Event: Gradually Appeared Etiology: Date Acquired: 12/07/2022 Wound Open Weeks Of Treatment: 8 Status: Clustered Wound: No Comorbid Cataracts,  Coronary Artery Disease, Hypertension, Peripheral History: Venous Disease, Type I Diabetes, Rheumatoid Arthritis, Osteoarthritis Wound Measurements Length: (cm) 0.5 Width: (cm) 0.7 Depth: (cm) 0.3 Area: (cm)  0.275 Volume: (cm) 0.082 % Reduction in Area: 45.3% % Reduction in Volume: -64% Epithelialization: None Tunneling: No Undermining: No Wound Description [name], [name] ([id number]) Classification: Grade Wound Margin: Epibo Exudate Amount: Mediu Exudate Type: Serou Exudate Color: amber [id number] Page 7 of 7 2 Foul Odor After Cleansing: No le Slough/Fibrino Yes m s Wound Bed Granulation Amount: Large (67-100%) Exposed Structure Granulation Quality: Pink, Pale Fascia Exposed: No Necrotic Amount: Small (1-33%) Fat Layer (Subcutaneous Tissue) Exposed: Yes Necrotic Quality: Adherent Slough Tendon Exposed: Yes Muscle Exposed: No Joint Exposed: No Bone Exposed: No Periwound Skin Texture Texture Color No Abnormalities Noted: No No Abnormalities Noted: No Callus: No Atrophie Blanche: No Crepitus: No Cyanosis: No Excoriation: No Ecchymosis: No Induration: No Erythema: No Rash: No Hemosiderin Staining: No Scarring: No Mottled: No Pallor: No Moisture Rubor: No No Abnormalities Noted: No Dry / Scaly: No Maceration: Yes Electronic Signature(s) Signed: 02/22/2023 1:54:09 PM By: [name] Stall RN, BSN Entered By: [name] on 02/22/2023 11:51:23 -------------------------------------------------------------------------------- Vitals Details Patient Name: Date of Service: [name] Pont, PA [name] J. 02/20/2023 8:45 A M Medical Record Number: [id number] Patient Account Number: [id number] Date of Birth/Sex: Treating RN: May 09, 1966 (57 y.o. [name] Primary Care Nikelle Malatesta: [name] Other Clinician: Referring Brayleigh Rybacki: Treating Britlyn Martine/Extender: [name] in Treatment: 8 Vital Signs Time Taken:  09:29 Temperature (F): 98.2 Height (in): 68 Pulse (bpm): 63 Weight (lbs): 200 Respiratory Rate (breaths/min): 20 Body Mass Index (BMI): 30.4 Blood Pressure (mmHg): 117/66 Reference Range: 80 - 120 mg / dl Electronic Signature(s) Signed: 02/22/2023 1:54:09 PM By: [name] Stall RN, BSN Entered By: [name] on 02/22/2023 11:50:14

## 2023-02-23 ENCOUNTER — Encounter (HOSPITAL_BASED_OUTPATIENT_CLINIC_OR_DEPARTMENT_OTHER): Payer: 59 | Admitting: Internal Medicine

## 2023-02-23 DIAGNOSIS — S91302A Unspecified open wound, left foot, initial encounter: Secondary | ICD-10-CM

## 2023-02-23 DIAGNOSIS — E1051 Type 1 diabetes mellitus with diabetic peripheral angiopathy without gangrene: Secondary | ICD-10-CM | POA: Diagnosis not present

## 2023-02-23 DIAGNOSIS — E10621 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: Secondary | ICD-10-CM

## 2023-02-23 DIAGNOSIS — L97528 Non-pressure chronic ulcer of other part of left foot with other specified severity: Secondary | ICD-10-CM

## 2023-02-23 DIAGNOSIS — I70245 Atherosclerosis of native arteries of left leg with ulceration of other part of foot: Secondary | ICD-10-CM | POA: Diagnosis not present

## 2023-02-23 LAB — GLUCOSE, CAPILLARY
Glucose-Capillary: 150 mg/dL — ABNORMAL HIGH (ref 70–99)
Glucose-Capillary: 142 mg/dL — ABNORMAL HIGH (ref 70–99)

## 2023-02-23 NOTE — Progress Notes (Signed)
[name] ([id number]) [id number] Page 1 of 1 Visit Report for 02/23/2023 Arrival Information Details Patient Name: Date of Service: [name], [name] 02/23/2023 7:30 A M Medical Record Number: [id number] Patient Account Number: [id number] Date of Birth/Sex: Treating RN: 04-Aug-1966 (57 y.o. Bayard Hugger, Bonita Quin Primary Care Vivika Poythress: [name] Other Clinician: [name] Referring Sherry Blackard: Treating Neeya Prigmore/Extender: Donnetta Hail in Treatment: 8 Visit Information History Since Last Visit All ordered tests and consults were completed: Yes Patient Arrived: Ambulatory Added or deleted any medications: No Arrival Time: 07:30 Any new allergies or adverse reactions: No Accompanied By: self Had a fall or experienced change in No Transfer Assistance: None activities of daily living that may affect Patient Identification Verified: Yes risk of falls: Secondary Verification Process Completed: Yes Signs or symptoms of abuse/neglect since last visito No Patient Requires Transmission-Based Precautions: No Hospitalized since last visit: No Patient Has Alerts: No Implantable device outside of the clinic excluding No cellular tissue based products placed in the center since last visit: Pain Present Now: No Electronic Signature(s) Signed: 02/23/2023 3:56:56 PM By: [name] CHT EMT BS , , Entered By: [name] on 02/23/2023 15:56:56 -------------------------------------------------------------------------------- Vitals Details Patient Name: Date of Service: [name] Pont, [name] 02/23/2023 7:30 A M Medical Record Number: [id number] Patient Account Number: [id number] Date of Birth/Sex: Treating RN: 12/08/1965 (57 y.o. [name] Primary Care Eustolia Drennen: [name] Other Clinician: [name] Referring Khylah Kendra: Treating Bianca Vester/Extender: Donnetta Hail in  Treatment: 8 Vital Signs Time Taken: 07:52 Temperature (F): 97.3 Height (in): 68 Pulse (bpm): 64 Weight (lbs): 200 Respiratory Rate (breaths/min): 18 Body Mass Index (BMI): 30.4 Blood Pressure (mmHg): 109/58 Capillary Blood Glucose (mg/dl): 086 Reference Range: 80 - 120 mg / dl Electronic Signature(s) Signed: 02/23/2023 3:57:24 PM By: [name] CHT EMT BS , , Entered By: [name] on 02/23/2023 15:57:23

## 2023-02-23 NOTE — Progress Notes (Signed)
[name], [name] ([id number]) [id number] Page 1 of 1 Visit Report for 02/23/2023 SuperBill Details Patient Name: Date of Service: [place], [name] 02/23/2023 Medical Record Number: [id number] Patient Account Number: [id number] Date of Birth/Sex: Treating RN: 1966-03-01 (57 y.o. 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 J44.9 Chronic obstructive pulmonary disease, unspecified M06.9 Rheumatoid arthritis, unspecified Z87.891 Personal history of nicotine dependence Facility Procedures CPT4 Code Description Modifier Quantity [id number] G0277-(Facility Use Only) HBOT full body chamber, , 5 ICD-10 Diagnosis Description 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 S91.302A Unspecified open wound, left foot, initial encounter 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 Physician Procedures Quantity CPT4 Code Description Modifier [id number] - WC PHYS HYPERBARIC OXYGEN THERAPY 1 ICD-10 Diagnosis Description 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

## 2023-02-23 NOTE — Progress Notes (Addendum)
[name], [name] ([id number]) [id number] Page 1 of 2 Visit Report for 02/23/2023 HBO Details Patient Name: Date of Service: [name], [name] 02/23/2023 7:30 A M Medical Record Number: [id number] Patient Account Number: [id number] Date of Birth/Sex: Treating RN: 08/14/1965 (57 y.o. [name] Primary Care Maryem Shuffler: [name] Other Clinician: [name] Referring Tahirih Lair: Treating Keyler Hoge/Extender: Donnetta Hail in Treatment: 8 HBO Treatment Course Details Treatment Course Number: 1 Ordering Quincey Quesinberry: Geralyn Corwin T Treatments Ordered: otal 40 HBO Treatment Start Date: 02/16/2023 HBO Indication: Other (specify in Notes) Notes: Acute peripheral arterial insufficiency HBO Treatment Details Treatment Number: 5 Patient Type: Outpatient Chamber Type: Monoplace Chamber Serial #: [id number] Treatment Protocol: 2.0 ATA with 90 minutes oxygen, with two 5 minute air breaks Treatment Details Compression Rate Down: 1.0 psi / minute De-Compression Rate Up: 1.0 psi / minute A breaks and breathing ir Compress Tx Pressure periods Decompress Decompress Begins Reached (leave unused spaces Begins Ends blank) Chamber Pressure (ATA 1 2 2 2 2 2  --2 1 ) Clock Time (24 hr) 08:13 08:33 09:03 09:08 09:38 09:43 - - 10:13 10:33 Treatment Length: 140 (minutes) Treatment Segments: 5 Vital Signs Capillary Blood Glucose Reference Range: 80 - 120 mg / dl HBO Diabetic Blood Glucose Intervention Range: <131 mg/dl or >308 mg/dl Type: Time Vitals Blood Pulse: Respiratory Temperature: Capillary Blood Glucose Pulse Action Taken: Pressure: Rate: Glucose (mg/dl): Meter #: Oximetry (%) Taken: Pre 07:52 109/58 64 18 97.3 150 asymptomatic for hypotension Post 10:42 126/63 67 18 97.2 142 none per protocol Treatment Response Treatment Toleration: Well Treatment Completion Status: Treatment Completed without Adverse Event Treatment  Notes Mr. Dimmick arrived for treatment with normal vital signs with exception of diastolic BP. He denied any symptoms related to hypotension. He prepared for treatment. After performing a safety check, he was placed in the chamber which was compressed at a rate of 1 psi/min on full ventilation to slow the rate to approximately 0.75 psi/min. He states that ears are clearing, however, the right ear equalizes slower. He tolerated the treatment and the subsequent decompression of the chamber at the same 1 psi/min to accommodate ear equalization. He denies pain to the ears, stating again that the right ear just equalizes slower and with more effort. Post treatment vital signs were within normal range. He was stable upon discharge. Physician HBO Attestation: I certify that I supervised this HBO treatment in accordance with Medicare guidelines. A trained emergency response team is readily available per Yes hospital policies and procedures. Continue HBOT as ordered. Yes Electronic Signature(s) Signed: 02/24/2023 4:26:53 PM By: Geralyn Corwin DO Previous Signature: 02/23/2023 4:02:27 PM Version By: [name] CHT EMT BS , , Previous Signature: 02/23/2023 4:55:26 PM Version By: Geralyn Corwin DO Entered By: Geralyn Corwin on 02/24/2023 12:47:58 Youngberg, [name] ([id number]) [id number] Page 2 of 2 -------------------------------------------------------------------------------- HBO Safety Checklist Details Patient Name: Date of Service: [name], [name] 02/23/2023 7:30 A M Medical Record Number: [id number] Patient Account Number: [id number] Date of Birth/Sex: Treating RN: [birth year] (57 y.o. [name] Primary Care Emira Eubanks: [name] Other Clinician: [name] Referring Danarius Mcconathy: Treating Vee Bahe/Extender: Donnetta Hail in Treatment: 8 HBO Safety Checklist Items Safety Checklist Consent Form Signed Patient  voided / foley secured and emptied When did you last eato 0700 - Egg sandwich Last dose of injectable or oral agent 0630 Long Acting Ostomy pouch emptied and vented if applicable NA All implantable devices assessed, documented and approved NA Intravenous access site secured  and place NA Valuables secured Linens and cotton and cotton/polyester blend (less than 51% polyester) Personal oil-based products / skin lotions / body lotions removed Wigs or hairpieces removed NA Smoking or tobacco materials removed NA Books / newspapers / magazines / loose paper removed Cologne, aftershave, perfume and deodorant removed Jewelry removed (may wrap wedding band) Make-up removed NA Hair care products removed Battery operated devices (external) removed Heating patches and chemical warmers removed Titanium eyewear removed Nail polish cured greater than 10 hours Casting material cured greater than 10 hours NA Hearing aids removed NA Loose dentures or partials removed NA Prosthetics have been removed NA Patient demonstrates correct use of air break device (if applicable) Patient concerns have been addressed Patient grounding bracelet on and cord attached to chamber Specifics for Inpatients (complete in addition to above) Medication sheet sent with patient NA Intravenous medications needed or due during therapy sent with patient NA Drainage tubes (e.g. nasogastric tube or chest tube secured and vented) NA Endotracheal or Tracheotomy tube secured NA Cuff deflated of air and inflated with saline NA Airway suctioned NA Notes Paper version used prior to treatment start. Electronic Signature(s) Signed: 02/23/2023 3:58:37 PM By: [name] CHT EMT BS , , Entered By: [name] on 02/23/2023 15:58:36

## 2023-02-23 NOTE — Progress Notes (Signed)
WESLY, WHISENANT ([id number]) [id number] Page 1 of 1 Visit Report for 02/19/2023 SuperBill Details Patient Name: Date of Service: [place], [name] 02/19/2023 Medical Record Number: [id number] Patient Account Number: [id number] Date of Birth/Sex: Treating RN: 07/26/1966 (57 y.o. 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 J44.9 Chronic obstructive pulmonary disease, unspecified M06.9 Rheumatoid arthritis, unspecified Z87.891 Personal history of nicotine dependence Facility Procedures CPT4 Code Description Modifier Quantity [id number] G0277-(Facility Use Only) HBOT full body chamber, , 4 ICD-10 Diagnosis Description 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 S91.302A Unspecified open wound, left foot, initial encounter 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 Physician Procedures Quantity CPT4 Code Description Modifier [id number] - WC PHYS HYPERBARIC OXYGEN THERAPY 1 ICD-10 Diagnosis Description 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

## 2023-02-24 ENCOUNTER — Encounter (HOSPITAL_BASED_OUTPATIENT_CLINIC_OR_DEPARTMENT_OTHER): Payer: 59 | Admitting: Internal Medicine

## 2023-02-24 ENCOUNTER — Encounter: Payer: Self-pay | Admitting: Vascular Surgery

## 2023-02-24 ENCOUNTER — Ambulatory Visit: Payer: 59 | Admitting: Vascular Surgery

## 2023-02-24 VITALS — BP 123/60 | HR 66 | Temp 96.3°F | Resp 16 | Ht 68.0 in | Wt 200.0 lb

## 2023-02-24 DIAGNOSIS — I739 Peripheral vascular disease, unspecified: Secondary | ICD-10-CM | POA: Diagnosis not present

## 2023-02-24 DIAGNOSIS — L97528 Non-pressure chronic ulcer of other part of left foot with other specified severity: Secondary | ICD-10-CM | POA: Diagnosis not present

## 2023-02-24 DIAGNOSIS — I70245 Atherosclerosis of native arteries of left leg with ulceration of other part of foot: Secondary | ICD-10-CM

## 2023-02-24 DIAGNOSIS — E1051 Type 1 diabetes mellitus with diabetic peripheral angiopathy without gangrene: Secondary | ICD-10-CM | POA: Diagnosis not present

## 2023-02-24 DIAGNOSIS — E10621 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: Secondary | ICD-10-CM

## 2023-02-24 DIAGNOSIS — J449 Chronic obstructive pulmonary disease, unspecified: Secondary | ICD-10-CM

## 2023-02-24 DIAGNOSIS — S91302A Unspecified open wound, left foot, initial encounter: Secondary | ICD-10-CM

## 2023-02-24 LAB — GLUCOSE, CAPILLARY
Glucose-Capillary: 129 mg/dL — ABNORMAL HIGH (ref 70–99)
Glucose-Capillary: 157 mg/dL — ABNORMAL HIGH (ref 70–99)
Glucose-Capillary: 206 mg/dL — ABNORMAL HIGH (ref 70–99)

## 2023-02-24 NOTE — Progress Notes (Signed)
Patient name: [name] MRN: [id number] DOB: 26-Jul-1966 Sex: male  REASON FOR CONSULT: wound check   HPI: [name] is a 57 y.o. male, with hx CAD, CKD, COPD, DM, HLD, tobacco abuse that presents for wound check of left achilles wound.  He states this wound started about early May.  This has been nonhealing.  He did have ABIs on 12/31/2022 that were 1.16 on the right triphasic and noncompressible on the left triphasic.  He did have a marginal toe pressure of 58 on the left.  He was recently performed a left lower extremity angiogram on 01/15/2023 showing two-vessel runoff in the anterior tibial and peroneal.    Today he feels the wound is slowly making progress.  Now doing hyperbaric oxygen.  States he saw Dr. [name] and is going to be scheduled for grafting.  Has since quit smoking.  Past Medical History:  Diagnosis Date   Anginal pain (HCC)    Anxiety    Arthritis    RA IN HANDS   Asthma    CAD (coronary artery disease) 04/27/2014   Chronic renal disease, stage 3, moderately decreased glomerular filtration rate between 30-59 mL/min/1.73 square meter (HCC) 05/03/2014   COPD (chronic obstructive pulmonary disease) (HCC)    Coronary artery disease    Diabetes (HCC) 05/03/2014   Diabetes mellitus without complication (HCC)    type 1   Hyperlipidemia    Left shoulder pain    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/03/2014   Shortness of breath    Sleep apnea    mild OSA, no CPAP   Unstable angina pectoris (HCC) 04/25/2014    Past Surgical History:  Procedure Laterality Date   ABDOMINAL AORTOGRAM W/LOWER EXTREMITY N/A 01/15/2023   Procedure: ABDOMINAL AORTOGRAM W/LOWER EXTREMITY;  Surgeon: Cephus Shelling, MD;  Location: [facility] INVASIVE [facility] LAB;  Service: Cardiovascular;  Laterality: N/A;   CARDIAC CATHETERIZATION  04/25/2014   BY DR Jacinto Halim   CARDIAC CATHETERIZATION N/A 10/30/2015   Procedure: Left Heart Cath and Cors/Grafts Angiography;  Surgeon: Yates Decamp,  MD;  Location: [facility] INVASIVE CV LAB;  Service: Cardiovascular;  Laterality: N/A;   CARPAL TUNNEL RELEASE     CATARACT EXTRACTION, BILATERAL     CORONARY ARTERY BYPASS GRAFT N/A 04/27/2014   Procedure: CORONARY ARTERY BYPASS GRAFTING on pump using left internal mammary artery to LAD coronary artery, right great saphenous vein graft to diagonal coronary artery with sequential to OM1 and circumflex coronary arteries. Right greater saphenous vein graft to posterior descending coronary artery. ;  Surgeon: Delight Ovens, MD;  Location: [facility] OR;  Service: Open Heart Surgery;  Laterality: N   elbow drained Left 05/09/2019   ENDOVEIN HARVEST OF GREATER SAPHENOUS VEIN Right 04/27/2014   Procedure: ENDOVEIN HARVEST OF GREATER SAPHENOUS VEIN;  Surgeon: Delight Ovens, MD;  Location: [facility] OR;  Service: Open Heart Surgery;  Laterality: Right;   EXCISION ORAL TUMOR N/A 03/01/2018   Procedure: EXCISION ORAL TUMOR;  Surgeon: Christia Reading, MD;  Location: [facility];  Service: ENT;  Laterality: N/A;   EYE SURGERY     LASER   EYE SURGERY Bilateral    astigmatism correction    HARDWARE REMOVAL Left 10/03/2020   Procedure: LEFT FOOT REMOVAL HARDWARE, PLACE VANC POWDER;  Surgeon: Nadara Mustard, MD;  Location: [facility] OR;  Service: Orthopedics;  Laterality: Left;   INTRAOPERATIVE TRANSESOPHAGEAL ECHOCARDIOGRAM N/A 04/27/2014   Procedure: INTRAOPERATIVE TRANSESOPHAGEAL ECHOCARDIOGRAM;  Surgeon: Delight Ovens, MD;  Location: [facility] OR;  Service: Open Heart Surgery;  Laterality: N/A;   IR RADIOLOGIST EVAL & MGMT  12/22/2022   LEFT HEART CATHETERIZATION WITH CORONARY ANGIOGRAM N/A 04/25/2014   Procedure: LEFT HEART CATHETERIZATION WITH CORONARY ANGIOGRAM;  Surgeon: Pamella Pert, MD;  Location: [facility] CATH LAB;  Service: Cardiovascular;  Laterality: N/A;   MOUTH SURGERY  11/15/2017   tongue surgery    ORIF TOE FRACTURE Left 03/22/2020   Procedure: OPEN REDUCTION INTERNAL FIXATION (ORIF) Non Union 5th Metatarsal;   Surgeon: Kathryne Hitch, MD;  Location: [facility];  Service: Orthopedics;  Laterality: Left;    Family History  Problem Relation Age of Onset   Stomach cancer Mother    Cancer Mother    Prostate cancer Father    Heart disease Father    Cancer - Prostate Father    Heart disease Brother    Asthma Daughter    Healthy Daughter    Asthma Daughter     SOCIAL HISTORY: Social History   Socioeconomic History   Marital status: Divorced    Spouse name: Not on file   Number of children: 3   Years of education: Not on file   Highest education level: Not on file  Occupational History   Not on file  Tobacco Use   Smoking status: Some Days    Current packs/day: 0.25    Average packs/day: 0.3 packs/day for 34.0 years (8.5 ttl pk-yrs)    Types: Cigarettes   Smokeless tobacco: Never  Vaping Use   Vaping status: Never Used  Substance and Sexual Activity   Alcohol use: Yes    Alcohol/week: 0.0 standard drinks of alcohol    Comment: RARE   Drug use: No   Sexual activity: Not on file  Other Topics Concern   Not on file  Social History Narrative   Not on file   Social Determinants of Health   Financial Resource Strain: Not on file  Food Insecurity: Not on file  Transportation Needs: Not on file  Physical Activity: Not on file  Stress: Not on file  Social Connections: Not on file  Intimate Partner Violence: Not on file    Allergies  Allergen Reactions   Metoprolol Diarrhea   Niacin And Related     Other reaction(s): night sweats   Tramadol     Other reaction(s): SOB    Current Outpatient Medications  Medication Sig Dispense Refill   albuterol (VENTOLIN HFA) 108 (90 Base) MCG/ACT inhaler INHALE 2 PUFFS BY MOUTH EVERY 6 HOURS AS NEEDED FOR WHEEZING 9 g 0   Ascorbic Acid (VITAMIN C) 1000 MG tablet Take 1,000 mg by mouth daily.     aspirin EC 81 MG tablet Take 1 tablet (81 mg total) by mouth daily. Swallow whole. 150 tablet 2   bisoprolol (ZEBETA) 5  MG tablet TAKE ONE-HALF TABLET BY MOUTH  DAILY 45 tablet 3   Blood Glucose Monitoring Suppl (ONETOUCH VERIO FLEX SYSTEM) w/Device KIT USE TO CHECK BLOOD GLUCOSE UP TO 10 TIMES PER DAY     buPROPion (WELLBUTRIN SR) 150 MG 12 hr tablet TAKE 1 TABLET BY MOUTH DAILY 90 tablet 3   Cholecalciferol (VITAMIN D3) 125 MCG (5000 UT) TABS Take 5,000 Units by mouth daily.     COD LIVER OIL PO Take 415 mg by mouth daily.     Continuous Blood Gluc Sensor (FREESTYLE LIBRE 14 DAY SENSOR) MISC CHANGE EVERY 14 DAYS TO MONITOR BLOOD GLUCOSE     doxycycline (VIBRA-TABS)  100 MG tablet Take 1 tablet (100 mg total) by mouth 2 (two) times daily. 40 tablet 0   gabapentin (NEURONTIN) 300 MG capsule Take 1 capsule (300 mg total) by mouth at bedtime. 30 capsule 2   HUMALOG 100 UNIT/ML injection Inject 15-20 Units into the skin 3 (three) times daily with meals. Sliding scale  Depending on carb intake     HUMIRA PEN 40 MG/0.4ML PNKT SMARTSIG:40 Milligram(s) SUB-Q Every 2 Weeks     insulin glargine (LANTUS) 100 UNIT/ML injection Inject 14-17 Units into the skin See admin instructions. Inject into the skin 17 units in the morning and 14 unit at bedtime     ipratropium (ATROVENT) 0.06 % nasal spray Place into both nostrils.     LINZESS 145 MCG CAPS capsule Take 145 mcg by mouth every morning.     losartan (COZAAR) 25 MG tablet Take 25 mg by mouth daily.     magnesium gluconate (MAGONATE) 500 MG tablet Take 500 mg by mouth daily.     nitroGLYCERIN (NITRODUR - DOSED IN MG/24 HR) 0.2 mg/hr patch Place 1 patch (0.2 mg total) onto the skin daily. 30 patch 12   nitroGLYCERIN (NITROSTAT) 0.4 MG SL tablet DISSOLVE 1 TABLET UNDER THE TONGUE EVERY 5 MINUTES AS  NEEDED FOR CHEST PAIN. MAX  OF 3 TABLETS IN 15 MINUTES. CALL 911 IF PAIN PERSISTS. (Patient taking differently: Place 0.4 mg under the tongue every 5 (five) minutes as needed for chest pain.) 100 tablet 3   oxymetazoline (AFRIN) 0.05 % nasal spray Place 2 sprays into both nostrils at  bedtime.     pentoxifylline (TRENTAL) 400 MG CR tablet Take 1 tablet (400 mg total) by mouth 3 (three) times daily with meals. 90 tablet 3   simvastatin (ZOCOR) 20 MG tablet TAKE 1 TABLET BY MOUTH AT BEDTIME 90 tablet 1   sulfamethoxazole-trimethoprim (BACTRIM DS) 800-160 MG tablet Take 1 tablet by mouth 2 (two) times daily. 20 tablet 0   tadalafil (CIALIS) 20 MG tablet Take 20 mg by mouth daily as needed.     zinc gluconate 50 MG tablet Take 50 mg by mouth daily.     budesonide-formoterol (SYMBICORT) 80-4.5 MCG/ACT inhaler Inhale 2 puffs into the lungs in the morning and at bedtime. (Patient not taking: Reported on 02/24/2023) 1 each 5   cetirizine (ZYRTEC) 10 MG tablet TAKE 1 TABLET BY MOUTH AT BEDTIME (Patient not taking: Reported on 02/24/2023) 30 tablet 2   Hydrocortisone, Perianal, 1 % CREA SMARTSIG:Rectally 3 Times Daily PRN (Patient not taking: Reported on 02/24/2023)     Lidocaine 4 % PTCH Place 1 patch onto the skin at bedtime. (Patient not taking: Reported on 02/24/2023)     ranolazine (RANEXA) 500 MG 12 hr tablet Take 250 mg by mouth 2 (two) times daily. (Patient not taking: Reported on 02/24/2023)     No current facility-administered medications for this visit.    REVIEW OF SYSTEMS:  [X]  denotes positive finding, [ ]  denotes negative finding Cardiac  Comments:  Chest pain or chest pressure:    Shortness of breath upon exertion:    Short of breath when lying flat:    Irregular heart rhythm:        Vascular    Pain in calf, thigh, or hip brought on by ambulation:    Pain in feet at night that wakes you up from your sleep:     Blood clot in your veins:    Leg swelling:  Pulmonary    Oxygen at home:    Productive cough:     Wheezing:         Neurologic    Sudden weakness in arms or legs:     Sudden numbness in arms or legs:     Sudden onset of difficulty speaking or slurred speech:    Temporary loss of vision in one eye:     Problems with dizziness:          Gastrointestinal    Blood in stool:     Vomited blood:         Genitourinary    Burning when urinating:     Blood in urine:        Psychiatric    Major depression:         Hematologic    Bleeding problems:    Problems with blood clotting too easily:        Skin    Rashes or ulcers:        Constitutional    Fever or chills:      PHYSICAL EXAM: Vitals:   02/24/23 1233  BP: 123/60  Pulse: 66  Resp: 16  Temp: (!) 96.3 F (35.7 C)  TempSrc: Temporal  SpO2: 94%  Weight: 200 lb (90.7 kg)  Height: 5\' 8"  (1.727 m)    GENERAL: The patient is a well-nourished male, in no acute distress. The vital signs are documented above. CARDIAC: There is a regular rate and rhythm.  VASCULAR:  Palpable femoral pulses bilaterally No palpable pedal pulses, triphasic dorsalis pedis signal in left foot Left achilles wound pictured  PULMONARY: No respiratory distress ABDOMEN: Soft and non-tender. MUSCULOSKELETAL: There are no major deformities or cyanosis. NEUROLOGIC: No focal weakness or paresthesias are detected. PSYCHIATRIC: The patient has a normal affect.    DATA:   ABIs on 12/31/2022 that were 1.16 on the right triphasic and noncompressible on the left triphasic.    Assessment/Plan:  57 y.o. male, with hx CAD, CKD, COPD, DM, HLD, tobacco abuse that presents for follow-up and wound check of left achilles wound.  States this wound started about early May.  An angiogram on 01/15/2023 showing two-vessel runoff in the anterior tibial and peroneal and no intervention was performed.  He has inline flow as previously documented.  I again discussed that I think he has enough inflow to heal his Achilles wound.  I discussed that these can be very difficult to heal given lack of blood supply over the Achilles on the back of the heel.  He still has a brisk triphasic dorsalis pedis signal in the left foot.  I agree with any plan for grafting by Dr. [name].  He is also being followed in the wound  clinic.  I will see him in 4 to 6 weeks for close interval surveillance.  He does have some disease in the distal anterior tibial that I do not feel is flow-limiting but discussed if wound fails to make progress we can always evaluate angioplasty/intervention of the distal AT.     Cephus Shelling, MD [facility] Office: [phone]

## 2023-02-24 NOTE — Progress Notes (Signed)
[name], [name] ([id number]) [id number] Page 1 of 1 Visit Report for 02/24/2023 SuperBill Details Patient Name: Date of Service: [name], [name] 02/24/2023 Medical Record Number: [id number] Patient Account Number: [id number] Date of Birth/Sex: Treating RN: May 08, 1966 (57 y.o. 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 J44.9 Chronic obstructive pulmonary disease, unspecified M06.9 Rheumatoid arthritis, unspecified Z87.891 Personal history of nicotine dependence Facility Procedures CPT4 Code Description Modifier Quantity [id number] G0277-(Facility Use Only) HBOT full body chamber, , 5 ICD-10 Diagnosis Description 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 S91.302A Unspecified open wound, left foot, initial encounter 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 Physician Procedures Quantity CPT4 Code Description Modifier [id number] 99183 - WC PHYS HYPERBARIC OXYGEN THERAPY 1 ICD-10 Diagnosis Description 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

## 2023-02-24 NOTE — Progress Notes (Addendum)
[name], [name] ([id number]) [id number] Page 1 of 2 Visit Report for 02/24/2023 HBO Details Patient Name: Date of Service: [name], [name] 02/24/2023 7:30 A M Medical Record Number: [id number] Patient Account Number: [id number] Date of Birth/Sex: Treating RN: 04-Mar-1966 (57 y.o. [name] Primary Care Nishawn Rotan: [name] Other Clinician: [name] Referring [name]: Treating Angell Pincock/Extender: Donnetta Hail in Treatment: 8 HBO Treatment Course Details Treatment Course Number: 1 Ordering Danaiya Steadman: Geralyn Corwin T Treatments Ordered: otal 40 HBO Treatment Start Date: 02/16/2023 HBO Indication: Other (specify in Notes) Notes: Acute peripheral arterial insufficiency HBO Treatment Details Treatment Number: 6 Patient Type: Outpatient Chamber Type: Monoplace Chamber Serial #: [id number] Treatment Protocol: 2.0 ATA with 90 minutes oxygen, with two 5 minute air breaks Treatment Details Compression Rate Down: 1.0 psi / minute De-Compression Rate Up: 1.0 psi / minute A breaks and breathing ir Compress Tx Pressure periods Decompress Decompress Begins Reached (leave unused spaces Begins Ends blank) Chamber Pressure (ATA 1 2 2 2 2 2  --2 1 ) Clock Time (24 hr) 08:19 08:39 09:09 09:14 09:44 09:49 - - 10:19 10:39 Treatment Length: 140 (minutes) Treatment Segments: 5 Vital Signs Capillary Blood Glucose Reference Range: 80 - 120 mg / dl HBO Diabetic Blood Glucose Intervention Range: <131 mg/dl or >518 mg/dl Type: Time Vitals Blood Pulse: Respiratory Capillary Blood Glucose Pulse Action Temperature: Taken: Pressure: Rate: Glucose (mg/dl): Meter #: Oximetry (%) Taken: Pre 07:48 115/60 68 18 97.5 129 8 oz Glucerna + 8 oz OJ Pre 08:17 157 Proceed with HBO Tx Post 10:44 132/66 66 18 97.1 206 none per protocol. Treatment Response Treatment Toleration: Well Treatment Completion Status: Treatment Completed  without Adverse Event Treatment Notes [name] arrived with vital signs within normal range except for blood glucose level of 129 mg/dL. He was given 8 oz Glucerna and 8 oz orange juice per physician's order. He prepared for treatment. At 0817, blood glucose level was re-measured at 157 mg/dL. Per protocol, patient was cleared for treatment. After performing a safety check, patient was placed in the chamber which was compressed with 100% oxygen at a rate of 1 psi/min with ventilation on full (450 L/min) to reduce actual travel rate to approximately 0.74 psi/min due to his right ear having some difficulty in equalization. He tolerated the treatment and subsequent decompression at a rate of approximately 0.74 psi/min as well. His post-treatment vital signs were within normal range. He was stable upon discharge to his wound care encounter. Physician HBO Attestation: I certify that I supervised this HBO treatment in accordance with Medicare guidelines. A trained emergency response team is readily available per Yes hospital policies and procedures. Continue HBOT as ordered. Yes Electronic Signature(s) Signed: 02/24/2023 4:26:53 PM By: Geralyn Corwin DO Previous Signature: 02/24/2023 12:08:09 PM Version By: [name] CHT EMT BS , , Previous Signature: 02/24/2023 11:55:35 AM Version By: [name] CHT EMT BS , , Previous Signature: 02/24/2023 11:50:29 AM Version By: [name] CHT EMT BS , , Entered By: Geralyn Corwin on 02/24/2023 16:22:54 [name] ([id number]) [id number] Page 2 of 2 -------------------------------------------------------------------------------- HBO Safety Checklist Details Patient Name: Date of Service: [name], [name] 02/24/2023 7:30 A M Medical Record Number: [id number] Patient Account Number: [id number] Date of Birth/Sex: Treating RN: Apr 16, 1966 (57 y.o. [name] Primary Care Lelah [name]: [name] Other Clinician: Karl Bales Referring Alexys Lobello: Treating Addalie Calles/Extender: Donnetta Hail in Treatment: 8 HBO Safety Checklist Items Safety Checklist Consent Form Signed Patient voided /  foley secured and emptied When did you last eato 0630 - Egg sandwich, coffee Last dose of injectable or oral agent 0630 17 Units Ostomy pouch emptied and vented if applicable NA All implantable devices assessed, documented and approved NA Intravenous access site secured and place NA Valuables secured Linens and cotton and cotton/polyester blend (less than 51% polyester) Personal oil-based products / skin lotions / body lotions removed Wigs or hairpieces removed NA Smoking or tobacco materials removed NA Books / newspapers / magazines / loose paper removed Cologne, aftershave, perfume and deodorant removed Jewelry removed (may wrap wedding band) Make-up removed NA Hair care products removed Battery operated devices (external) removed Heating patches and chemical warmers removed Titanium eyewear removed Nail polish cured greater than 10 hours NA Casting material cured greater than 10 hours NA Hearing aids removed NA Loose dentures or partials removed NA Prosthetics have been removed NA Patient demonstrates correct use of air break device (if applicable) Patient concerns have been addressed Patient grounding bracelet on and cord attached to chamber Specifics for Inpatients (complete in addition to above) Medication sheet sent with patient NA Intravenous medications needed or due during therapy sent with patient NA Drainage tubes (e.g. nasogastric tube or chest tube secured and vented) NA Endotracheal or Tracheotomy tube secured NA Cuff deflated of air and inflated with saline NA Airway suctioned NA Notes Paper version used prior to treatment start. Electronic Signature(s) Signed: 02/24/2023 10:08:27 AM By: [name] CHT EMT BS ,  , Entered By: [name] on 02/24/2023 10:08:27

## 2023-02-24 NOTE — Progress Notes (Addendum)
BRANTON, EINSTEIN ([id number]) [id number] Page 1 of 2 Visit Report for 02/24/2023 Arrival Information Details Patient Name: Date of Service: [place], [name] 02/24/2023 7:30 A M Medical Record Number: [id number] Patient Account Number: [id number] Date of Birth/Sex: Treating RN: 07/26/1966 (57 y.o. [name] Primary Care Rueben Kassim: [name] Other Clinician: Karl Bales Referring Janielle Mittelstadt: Treating Leoni Goodness/Extender: Donnetta Hail in Treatment: 8 Visit Information History Since Last Visit All ordered tests and consults were completed: Yes Patient Arrived: Ambulatory Added or deleted any medications: No Arrival Time: 07:43 Any new allergies or adverse reactions: No Accompanied By: self Had a fall or experienced change in No Transfer Assistance: None activities of daily living that may affect Patient Identification Verified: Yes risk of falls: Secondary Verification Process Completed: Yes Signs or symptoms of abuse/neglect since last visito No Patient Requires Transmission-Based Precautions: No Hospitalized since last visit: No Patient Has Alerts: No Implantable device outside of the clinic excluding No cellular tissue based products placed in the center since last visit: Pain Present Now: No Electronic Signature(s) Signed: 02/24/2023 10:06:56 AM By: [name] CHT EMT BS , , Previous Signature: 02/24/2023 10:05:33 AM Version By: [name] CHT EMT BS , , Entered By: [name] on 02/24/2023 10:06:56 -------------------------------------------------------------------------------- Encounter Discharge Information Details Patient Name: Date of Service: [name], [name] 02/24/2023 7:30 A M Medical Record Number: [id number] Patient Account Number: [id number] Date of Birth/Sex: Treating RN: 1965-09-30 (57 y.o. [name] Primary Care Odell Fasching: [name] Other Clinician:  [name] Referring Zawadi Aplin: Treating Chanon Loney/Extender: Donnetta Hail in Treatment: 8 Encounter Discharge Information Items Discharge Condition: Stable Ambulatory Status: Ambulatory Discharge Destination: Other (Note Required) Transportation: Private Auto Accompanied By: staff Schedule Follow-up Appointment: No Clinical Summary of Care: Electronic Signature(s) Signed: 02/24/2023 12:05:42 PM By: [name] CHT EMT BS , , Entered By: [name] on 02/24/2023 12:05:42 [name] ([id number]) [id number] Page 2 of 2 -------------------------------------------------------------------------------- Vitals Details Patient Name: Date of Service: [place], [name] 02/24/2023 7:30 A M Medical Record Number: [id number] Patient Account Number: [id number] Date of Birth/Sex: Treating RN: 10-Jun-1966 (57 y.o. [name] Primary Care Porter Nakama: [name] Other Clinician: Karl Bales Referring Jaysha Lasure: Treating Kenzey Birkland/Extender: Donnetta Hail in Treatment: 8 Vital Signs Time Taken: 07:48 Temperature (F): 97.5 Height (in): 68 Pulse (bpm): 68 Weight (lbs): 200 Respiratory Rate (breaths/min): 18 Body Mass Index (BMI): 30.4 Blood Pressure (mmHg): 115/60 Capillary Blood Glucose (mg/dl): 573 Reference Range: 80 - 120 mg / dl Electronic Signature(s) Signed: 02/24/2023 10:06:45 AM By: [name] CHT EMT BS , , Entered By: [name] on 02/24/2023 10:06:45

## 2023-02-25 ENCOUNTER — Encounter (HOSPITAL_BASED_OUTPATIENT_CLINIC_OR_DEPARTMENT_OTHER): Payer: 59 | Admitting: Internal Medicine

## 2023-02-25 DIAGNOSIS — E1051 Type 1 diabetes mellitus with diabetic peripheral angiopathy without gangrene: Secondary | ICD-10-CM | POA: Diagnosis not present

## 2023-02-25 LAB — GLUCOSE, CAPILLARY
Glucose-Capillary: 94 mg/dL (ref 70–99)
Glucose-Capillary: 128 mg/dL — ABNORMAL HIGH (ref 70–99)
Glucose-Capillary: 245 mg/dL — ABNORMAL HIGH (ref 70–99)

## 2023-02-25 NOTE — Progress Notes (Addendum)
JACORION, KLEM ([id number]) [id number] Page 1 of 2 Visit Report for 02/25/2023 Arrival Information Details Patient Name: Date of Service: [name], [name] 02/25/2023 7:30 A M Medical Record Number: [id number] Patient Account Number: [id number] Date of Birth/Sex: Treating RN: 13-Feb-1966 (57 y.o. [name], [name] Primary Care Maccoy Haubner: [name] Other Clinician: [name] Referring Geddy Boydstun: Treating Chelise Hanger/Extender: Theodis Sato in Treatment: 8 Visit Information History Since Last Visit All ordered tests and consults were completed: Yes Patient Arrived: Ambulatory Added or deleted any medications: No Arrival Time: 07:43 Any new allergies or adverse reactions: No Accompanied By: self Had a fall or experienced change in No Transfer Assistance: None activities of daily living that may affect Patient Identification Verified: Yes risk of falls: Secondary Verification Process Completed: Yes Signs or symptoms of abuse/neglect since last visito No Patient Requires Transmission-Based Precautions: No Hospitalized since last visit: No Patient Has Alerts: No Implantable device outside of the clinic excluding No cellular tissue based products placed in the center since last visit: Pain Present Now: No Electronic Signature(s) Signed: 02/25/2023 11:36:22 AM By: [name] CHT EMT BS , , Entered By: [name] on 02/25/2023 11:36:22 -------------------------------------------------------------------------------- Encounter Discharge Information Details Patient Name: Date of Service: [name] Pont, [name] 02/25/2023 7:30 A M Medical Record Number: [id number] Patient Account Number: [id number] Date of Birth/Sex: Treating RN: 1966-04-29 (57 y.o. [name] Primary Care Trinaty Bundrick: [name] Other Clinician: [name] Referring Jovann Luse: Treating Skiler Tye/Extender: Theodis Sato in Treatment: 8 Encounter Discharge Information Items Discharge Condition: Stable Ambulatory Status: Ambulatory Discharge Destination: Home Transportation: Private Auto Accompanied By: self Schedule Follow-up Appointment: No Clinical Summary of Care: Electronic Signature(s) Signed: 02/25/2023 1:02:59 PM By: [name] CHT EMT BS , , Entered By: [name] on 02/25/2023 13:02:59 [name] ([id number]) [id number] Page 2 of 2 -------------------------------------------------------------------------------- Vitals Details Patient Name: Date of Service: KOLAR, [name] 02/25/2023 7:30 A M Medical Record Number: [id number] Patient Account Number: [id number] Date of Birth/Sex: Treating RN: [birth year] (57 y.o. [name], [name] Primary Care Shernell Saldierna: [name] Other Clinician: [name] Referring Deysha Cartier: Treating Samatha Anspach/Extender: Theodis Sato in Treatment: 8 Vital Signs Time Taken: 08:27 Temperature (F): 97.3 Height (in): 68 Pulse (bpm): 68 Weight (lbs): 200 Respiratory Rate (breaths/min): 18 Body Mass Index (BMI): 30.4 Blood Pressure (mmHg): 129/67 Capillary Blood Glucose (mg/dl): 94 Reference Range: 80 - 120 mg / dl Electronic Signature(s) Signed: 02/25/2023 11:36:52 AM By: [name] CHT EMT BS , , Entered By: [name] on 02/25/2023 11:36:52

## 2023-02-25 NOTE — Progress Notes (Signed)
LORENSO, QUIRINO ([id number]) [id number] Page 1 of 1 Visit Report for 02/25/2023 SuperBill Details Patient Name: Date of Service: [name], [name] 02/25/2023 Medical Record Number: [id number] Patient Account Number: [id number] Date of Birth/Sex: Treating RN: 02-28-1966 (57 y.o. 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 J44.9 Chronic obstructive pulmonary disease, unspecified M06.9 Rheumatoid arthritis, unspecified Z87.891 Personal history of nicotine dependence Facility Procedures CPT4 Code Description Modifier Quantity [id number] G0277-(Facility Use Only) HBOT full body chamber, , 4 ICD-10 Diagnosis Description 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 S91.302A Unspecified open wound, left foot, initial encounter 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 Physician Procedures Quantity CPT4 Code Description Modifier [id number] - WC PHYS HYPERBARIC OXYGEN THERAPY 1 ICD-10 Diagnosis Description 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

## 2023-02-25 NOTE — Progress Notes (Addendum)
[name], [name] ([id number]) [id number] Page 1 of 2 Visit Report for 02/25/2023 HBO Details Patient Name: Date of Service: [place], [name] 02/25/2023 7:30 A M Medical Record Number: [id number] Patient Account Number: [id number] Date of Birth/Sex: Treating RN: 10-24-1965 (57 y.o. [name], [name] Primary Care Graeme Menees: [name] Other Clinician: [name] Referring Maely Clements: Treating Suly Vukelich/Extender: Theodis Sato in Treatment: 8 HBO Treatment Course Details Treatment Course Number: 1 Ordering Valecia Beske: Geralyn Corwin T Treatments Ordered: otal 40 HBO Treatment Start Date: 02/16/2023 HBO Indication: Other (specify in Notes) Notes: Acute peripheral arterial insufficiency HBO Treatment Details Treatment Number: 7 Patient Type: Outpatient Chamber Type: Monoplace Chamber Serial #: [id number] Treatment Protocol: 2.0 ATA with 90 minutes oxygen, with two 5 minute air breaks Treatment Details Compression Rate Down: 1.0 psi / minute De-Compression Rate Up: 1.0 psi / minute A breaks and breathing ir Compress Tx Pressure periods Decompress Decompress Begins Reached (leave unused spaces Begins Ends blank) Chamber Pressure (ATA 1 2 2 2 2 2  --2 1 ) Clock Time (24 hr) 08:37 08:56 09:26 09:31 10:01 10:06 - - 10:36 10:51 Treatment Length: 134 (minutes) Treatment Segments: 4 Vital Signs Capillary Blood Glucose Reference Range: 80 - 120 mg / dl HBO Diabetic Blood Glucose Intervention Range: <131 mg/dl or >865 mg/dl Type: Time Vitals Blood Pulse: Respiratory Temperature: Capillary Blood Glucose Pulse Action Taken: Pressure: Rate: Glucose (mg/dl): Meter #: Oximetry (%) Taken: Pre 07:51 94 8 oz Glucerna + 2 OJ given per orders Pre 08:27 129/67 68 18 97.3 128 Cleared for HBOT per orders and protocol Post 10:55 130/64 68 18 97.8 245 none per protocol. Treatment Response Treatment Toleration: Well Treatment Completion Status:  Treatment Completed without Adverse Event Treatment Notes [name] arrived with normal vital signs except for blood glucose level of 94 mg/dL. Patient stated that he had eaten an Egg Sandwich prior to arriving approximately 0700 and taken Lantus approximately 0630. He was given 8 oz Glucerna and 8 oz Orange Juice per orders. After 30 minutes, blood glucose was re-measured at 128 mg/dL. After performing a safety check, patient was placed in the chamber which was compressed with 100% oxygen at a rate set of 1 psi/min at 450 L/min to reduce actual travel rate to approximately 0.75 psi/min. Educated patient on techniques for equalization to improve equalization experience while traveling. He tolerated the treatment and subsequent decompression at a rate of 1.5 psi/min as patient discovered other technique to equalize ears. He tolerated travel, saying that turning head while yawning helped. Post treatment vital signs were within normal range with his blood glucose level measuring at 245 mg/dL. He was stable upon discharge. Mazie Fencl Notes No concerns with treatment given Physician HBO Attestation: I certify that I supervised this HBO treatment in accordance with Medicare guidelines. A trained emergency response team is readily available per Yes hospital policies and procedures. Continue HBOT as ordered. Yes Electronic Signature(s) DELRAY, REZA ([id number]) [id number] Page 2 of 2 Signed: 02/25/2023 4:12:50 PM By: [name] MD Previous Signature: 02/25/2023 12:59:38 PM Version By: [name] CHT EMT BS , , Previous Signature: 02/25/2023 12:27:22 PM Version By: [name] CHT EMT BS , , Entered By: [name] on 02/25/2023 16:09:04 -------------------------------------------------------------------------------- HBO Safety Checklist Details Patient Name: Date of Service: [name], [name] 02/25/2023 7:30 A M Medical Record Number:  [id number] Patient Account Number: [id number] Date of Birth/Sex: Treating RN: [birth year] (57 y.o. [name] Primary Care Lovely Kerins: [name] Other Clinician: [name] Referring Alva Kuenzel:  Treating Traveon Louro/Extender: Theodis Sato in Treatment: 8 HBO Safety Checklist Items Safety Checklist Consent Form Signed Patient voided / foley secured and emptied When did you last eato 0700- Egg sandwich Last dose of injectable or oral agent 0630 Lantus Ostomy pouch emptied and vented if applicable All implantable devices assessed, documented and approved [employer], 2 and 3 on Approved Items List Intravenous access site secured and place NA Valuables secured NA Linens and cotton and cotton/polyester blend (less than 51% polyester) Personal oil-based products / skin lotions / body lotions removed Wigs or hairpieces removed NA Smoking or tobacco materials removed NA Books / newspapers / magazines / loose paper removed Cologne, aftershave, perfume and deodorant removed Jewelry removed (may wrap wedding band) Make-up removed NA Hair care products removed Battery operated devices (external) removed Heating patches and chemical warmers removed Titanium eyewear removed Nail polish cured greater than 10 hours NA Casting material cured greater than 10 hours NA Hearing aids removed NA Loose dentures or partials removed NA Prosthetics have been removed NA Patient demonstrates correct use of air break device (if applicable) Patient concerns have been addressed Patient grounding bracelet on and cord attached to chamber Specifics for Inpatients (complete in addition to above) Medication sheet sent with patient NA Intravenous medications needed or due during therapy sent with patient NA Drainage tubes (e.g. nasogastric tube or chest tube secured and vented) NA Endotracheal or Tracheotomy tube secured NA Cuff deflated of air and inflated  with saline NA Airway suctioned NA Notes Paper version used prior to treatment start. Electronic Signature(s) Signed: 02/25/2023 12:17:52 PM By: [name] CHT EMT BS , , Entered By: [name] on 02/25/2023 12:17:52

## 2023-02-25 NOTE — Progress Notes (Signed)
[name] ([id number]) [id number] Page 1 of 9 Visit Report for 02/24/2023 Arrival Information Details Patient Name: Date of Service: [name] 02/24/2023 11:00 A M Medical Record Number: [id number] Patient Account Number: [id number] Date of Birth/Sex: Treating RN: 09-Feb-1966 (57 y.o. [name] Primary Care Cage Gupton: [name] Other Clinician: Referring Ida Uppal: Treating Latonia Conrow/Extender: Donnetta Hail in Treatment: 8 Visit Information History Since Last Visit All ordered tests and consults were completed: Yes Patient Arrived: Ambulatory Added or deleted any medications: No Arrival Time: 11:07 Any new allergies or adverse reactions: No Accompanied By: self Had a fall or experienced change in No Transfer Assistance: None activities of daily living that may affect Patient Identification Verified: Yes risk of falls: Secondary Verification Process Completed: Yes Signs or symptoms of abuse/neglect since last visito No Patient Requires Transmission-Based Precautions: No Hospitalized since last visit: No Patient Has Alerts: No Implantable device outside of the clinic excluding No cellular tissue based products placed in the center since last visit: Has Dressing in Place as Prescribed: Yes Pain Present Now: No Electronic Signature(s) Signed: 02/25/2023 11:11:00 AM By: Brenton Grills Entered By: Brenton Grills on 02/24/2023 11:08:08 -------------------------------------------------------------------------------- Clinic Level of Care Assessment Details Patient Name: Date of Service: [name] 02/24/2023 11:00 A M Medical Record Number: [id number] Patient Account Number: [id number] Date of Birth/Sex: Treating RN: 12/18/1965 (57 y.o. [name] Primary Care Dior Dominik: [name] Other Clinician: Referring Cree Kunert: Treating Jhanae Jaskowiak/Extender: Donnetta Hail  in Treatment: 8 Clinic Level of Care Assessment Items TOOL 4 Quantity Score X- 1 0 Use when only an EandM is performed on FOLLOW-UP visit ASSESSMENTS - Nursing Assessment / Reassessment X- 1 10 Reassessment of Co-morbidities (includes updates in patient status) X- 1 5 Reassessment of Adherence to Treatment Plan ASSESSMENTS - Wound and Skin A ssessment / Reassessment X - Simple Wound Assessment / Reassessment - one wound 1 5 []  - 0 Complex Wound Assessment / Reassessment - multiple wounds []  - 0 Dermatologic / Skin Assessment (not related to wound area) ASSESSMENTS - Focused Assessment X- 1 5 Circumferential Edema Measurements - multi extremities []  - 0 Nutritional Assessment / Counseling / Intervention [name], [name] ([id number]) [id number] Page 2 of 9 []  - 0 Lower Extremity Assessment (monofilament, tuning fork, pulses) []  - 0 Peripheral Arterial Disease Assessment (using hand held doppler) ASSESSMENTS - Ostomy and/or Continence Assessment and Care []  - 0 Incontinence Assessment and Management []  - 0 Ostomy Care Assessment and Management (repouching, etc.) PROCESS - Coordination of Care X - Simple Patient / Family Education for ongoing care 1 15 []  - 0 Complex (extensive) Patient / Family Education for ongoing care X- 1 10 Staff obtains Chiropractor, Records, T Results / Process Orders est []  - 0 Staff telephones HHA, Nursing Homes / Clarify orders / etc []  - 0 Routine Transfer to another Facility (non-emergent condition) []  - 0 Routine Hospital Admission (non-emergent condition) []  - 0 New Admissions / Manufacturing engineer / Ordering NPWT Apligraf, etc. , []  - 0 Emergency Hospital Admission (emergent condition) X- 1 10 Simple Discharge Coordination []  - 0 Complex (extensive) Discharge Coordination PROCESS - Special Needs []  - 0 Pediatric / Minor Patient Management []  - 0 Isolation Patient Management []  - 0 Hearing / Language /  Visual special needs []  - 0 Assessment of Community assistance (transportation, D/C planning, etc.) []  - 0 Additional assistance / Altered mentation []  - 0 Support Surface(s) Assessment (bed, cushion, seat, etc.) INTERVENTIONS - Wound  Cleansing / Measurement X - Simple Wound Cleansing - one wound 1 5 []  - 0 Complex Wound Cleansing - multiple wounds X- 1 5 Wound Imaging (photographs - any number of wounds) []  - 0 Wound Tracing (instead of photographs) X- 1 5 Simple Wound Measurement - one wound []  - 0 Complex Wound Measurement - multiple wounds INTERVENTIONS - Wound Dressings []  - 0 Small Wound Dressing one or multiple wounds X- 1 15 Medium Wound Dressing one or multiple wounds []  - 0 Large Wound Dressing one or multiple wounds X- 1 5 Application of Medications - topical []  - 0 Application of Medications - injection INTERVENTIONS - Miscellaneous []  - 0 External ear exam []  - 0 Specimen Collection (cultures, biopsies, blood, body fluids, etc.) []  - 0 Specimen(s) / Culture(s) sent or taken to Lab for analysis []  - 0 Patient Transfer (multiple staff / Nurse, adult / Similar devices) []  - 0 Simple Staple / Suture removal (25 or less) []  - 0 Complex Staple / Suture removal (26 or more) []  - 0 Hypo / Hyperglycemic Management (close monitor of Blood Glucose) [name] ([id number]) [id number] Page 3 of 9 []  - 0 Ankle / Brachial Index (ABI) - do not check if billed separately X- 1 5 Vital Signs Has the patient been seen at the hospital within the last three years: Yes Total Score: 100 Level Of Care: New/Established - Level 3 Electronic Signature(s) Signed: 02/25/2023 11:11:00 AM By: Brenton Grills Entered By: Brenton Grills on 02/24/2023 11:30:07 -------------------------------------------------------------------------------- Encounter Discharge Information Details Patient Name: Date of Service: [name] Pont, PA [name] J. 02/24/2023 11:00 A  M Medical Record Number: [id number] Patient Account Number: [id number] Date of Birth/Sex: Treating RN: Oct 29, 1965 (57 y.o. [name] Primary Care Kashton Mcartor: [name] Other Clinician: Referring Marquel Spoto: Treating Kashauna Celmer/Extender: Donnetta Hail in Treatment: 8 Encounter Discharge Information Items Discharge Condition: Stable Ambulatory Status: Ambulatory Discharge Destination: Home Transportation: Private Auto Accompanied By: self Schedule Follow-up Appointment: Yes Clinical Summary of Care: Patient Declined Electronic Signature(s) Signed: 02/25/2023 11:11:00 AM By: Brenton Grills Entered By: Brenton Grills on 02/24/2023 11:43:24 -------------------------------------------------------------------------------- Lower Extremity Assessment Details Patient Name: Date of Service: [name], [place] [name] J. 02/24/2023 11:00 A M Medical Record Number: [id number] Patient Account Number: [id number] Date of Birth/Sex: Treating RN: [birth year] (57 y.o. [name] Primary Care Jenna Ardoin: [name] Other Clinician: Referring Reynald Woods: Treating Zaviyar Rahal/Extender: Donnetta Hail in Treatment: 8 Edema Assessment Assessed: [Left: No] [Right: No] Edema: [Left: N] [Right: o] Calf Left: Right: Point of Measurement: 32 cm From Medial Instep 37 cm Ankle Left: Right: Point of Measurement: 13 cm From Medial Instep 21.2 cm Vascular Assessment Left: [id number] Page 4 of 9Right:] Pulses: Dorsalis Pedis Palpable: [[id number] Page 4 of 9Yes] Extremity colors, hair growth, and conditions: Extremity Color: [[id number] Page 4 of 9Normal] Hair Growth on Extremity: [phone].pdf Page 4 of 9Yes] Temperature of Extremity: [phone].pdf Page 4 of 9Warm] Capillary Refill: [id number]  Page 4 of 9< 3 seconds] Dependent Rubor: [[id number] Page 4 of 9No No] Toe Nail Assessment Left: Right: Thick: No Discolored: No Deformed: No Improper Length and Hygiene: No Electronic Signature(s) Signed: 02/25/2023 11:11:00 AM By: Brenton Grills Entered By: Brenton Grills on 02/24/2023 11:11:33 -------------------------------------------------------------------------------- Multi Wound Chart Details Patient Name: Date of Service: [name] Pont, PA [name] J. 02/24/2023 11:00 A M Medical Record Number: [id number] Patient Account Number: [id number] Date of Birth/Sex: Treating RN: 1965/11/12 (57 y.o. 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 Secondary Etiology: Cataracts, Coronary Artery Disease, N/A Comorbid History:  Hypertension, Peripheral Venous Disease, Type I Diabetes, Rheumatoid Arthritis, Osteoarthritis 12/07/2022 Date Acquired: 8 Weeks of Treatment: Open Wound Status: No Wound Recurrence: 0.5x0.6x0.2 Measurements L x W x D (cm) 0.236 A (cm) : rea 0.047 Volume  (cm) : 53.10% % Reduction in A rea: 6.00% % Reduction in Volume: Grade 2 Classification: Medium Exudate A mount: Serous Exudate Type: amber Exudate Color: Epibole Wound Margin: Large (67-100%) Granulation A mount: Pink, Pale Granulation Quality: Small  (1-33%) Necrotic A mount: Fat Layer (Subcutaneous Tissue): Yes N/A Exposed Structures:] [N/A:N/A N/A N/A N/A N/A N/A N/A N/A N/A N/A N/A N/A N/A N/A N/A N/A  N/A N/A N/A N/A N/A N/A] DEAGAN, SEVIN ([id number]) [1:Tendon: Yes Fascia: No Muscle: No Joint: No Bone: No None Epithelialization: Excoriation: No Periwound Skin Texture: Induration: No Callus: No Crepitus: No Rash: No Scarring: No Maceration: Yes Periwound Skin Moisture: Dry/Scaly: No Atrophie Blanche:  No Periwound Skin Color: Cyanosis: No Ecchymosis: No Erythema: No Hemosiderin Staining: No Mottled: No Pallor: No Rubor: No] [N/A:N/A N/A N/A N/A] Treatment Notes Wound #1 (Achilles) Wound Laterality: Left Cleanser Soap and Water Discharge Instruction: May shower and wash wound with dial antibacterial soap and water prior to dressing change. Vashe 5.8 (oz) Discharge Instruction: Cleanse the wound with Vashe prior to applying a clean dressing using gauze sponges, not tissue or cotton balls. Peri-Wound Care Skin Prep Discharge Instruction: Use skin prep as directed Topical Primary Dressing Hydrofera Blue Classic Foam, 2x2 in Discharge Instruction: Moisten with saline prior to applying to wound bed Santyl Ointment Discharge Instruction: Apply nickel thick amount to wound bed as instructed Secondary Dressing Zetuvit Plus Silicone Border Dressing 4x4 (in/in) Discharge Instruction: Apply silicone border over primary dressing as directed. Secured With Compression Wrap Compression Stockings Facilities manager) Signed: 02/24/2023 4:26:53 PM By: Geralyn Corwin DO Entered By: Geralyn Corwin on 02/24/2023 12:42:31 -------------------------------------------------------------------------------- Multi-Disciplinary Care Plan Details Patient Name: Date of Service: [name], [place] [name] J. 02/24/2023 11:00 A M Medical Record Number: [id number] Patient Account Number: [id number] Date of Birth/Sex: Treating RN: [birth year] (57 y.o. [name] Primary Care Bertice Risse: [name] Other Clinician: Referring Mataya Kilduff: Treating Dwon Sky/Extender: Donnetta Hail in Treatment: [address] [place] ([id number]) [id number] Page 6 of 9 Active Inactive HBO Nursing Diagnoses: Potential for barotraumas to ears, sinuses, teeth, and lungs or cerebral gas embolism related to changes in atmospheric pressure inside hyperbaric oxygen chamber Potential for oxygen toxicity seizures related to delivery of 100% oxygen at an increased atmospheric pressure Potential for pulmonary oxygen toxicity related to delivery of 100% oxygen at an increased atmospheric pressure Goals: Patient and/or family will be able to state/discuss factors appropriate to the management of their disease process during treatment Date Initiated: 02/16/2023 Target Resolution Date: 04/10/2023 Goal Status: Active Patient will tolerate the hyperbaric oxygen therapy treatment Date Initiated: 02/16/2023 Target Resolution Date: 04/10/2023 Goal Status: Active Patient/caregiver will verbalize understanding of HBO goals, rationale, procedures and potential hazards Date Initiated: 02/16/2023 Target Resolution Date: 04/10/2023 Goal Status: Active Interventions: Administer the correct therapeutic gas delivery based on the patients needs and limitations, per physician order Assess and provide for patients comfort related to  the hyperbaric environment and equalization of middle ear Assess for signs and symptoms related to adverse events, including but not limited to confinement anxiety, pneumothorax, oxygen toxicity and baurotrauma Assess patient for any history of confinement anxiety Assess patient's knowledge and expectations regarding hyperbaric medicine and provide education related to the hyperbaric environment, goals of treatment and prevention of adverse events Implement protocols to decrease risk of pneumothorax in high risk patients Notes: Wound/Skin Impairment Nursing Diagnoses: Impaired tissue integrity Knowledge deficit related to smoking impact on wound  healing Knowledge deficit related to ulceration/compromised skin integrity Goals: Patient will have a decrease in wound volume by X% from date: (specify in notes) Date Initiated: 12/25/2022 Target Resolution Date: 04/02/2023 Goal Status: Active Patient/caregiver will verbalize understanding of skin care regimen Date Initiated: 12/25/2022 Target Resolution Date: 04/03/2023 Goal Status: Active Ulcer/skin breakdown will have a volume reduction of 30% by week 4 Date Initiated: 12/25/2022 Date Inactivated: 01/27/2023 Target Resolution Date: 01/22/2023 Unmet Reason: no major changes; Goal Status: Unmet continue current treatment. Interventions: Assess patient/caregiver ability to obtain necessary supplies Assess patient/caregiver ability to perform ulcer/skin care regimen upon admission and as needed Assess ulceration(s) every visit Provide education on smoking Provide education on ulcer and skin care Notes: Electronic Signature(s) Signed: 02/25/2023 11:11:00 AM By: Brenton Grills Entered By: Brenton Grills on 02/24/2023 11:20:32 [name] ([id number]) [id number] Page 7 of 9 -------------------------------------------------------------------------------- Pain Assessment Details Patient Name: Date of Service: CANELO, [name] 02/24/2023 11:00 A M Medical Record Number: [id number] Patient Account Number: [id number] Date of Birth/Sex: Treating RN: May 13, 1966 (57 y.o. [name] Primary Care Karalyne Nusser: [name] Other Clinician: Referring Caleen Taaffe: Treating Chang Tiggs/Extender: Donnetta Hail in Treatment: 8 Active Problems Location of Pain Severity and Description of Pain Patient Has Paino No Site Locations Pain Management and Medication Current Pain Management: Electronic Signature(s) Signed: 02/25/2023 11:11:00 AM By: Brenton Grills Entered By: Brenton Grills on 02/24/2023  11:10:35 -------------------------------------------------------------------------------- Patient/Caregiver Education Details Patient Name: Date of Service: Koy, PA [name] J. 7/23/2024andnbsp11:00 A M Medical Record Number: [id number] Patient Account Number: [id number] Date of Birth/Gender: Treating RN: Aug 07, 1965 (57 y.o. [name] Primary Care Physician: [name] Other Clinician: Referring Physician: Treating Physician/Extender: Donnetta Hail in Treatment: 8 Education Assessment Education Provided To: Patient Education Topics Provided Wound/Skin Impairment: Methods: Explain/Verbal Responses: State content correctly Electronic Signature(s) Signed: 02/25/2023 11:11:00 AM By: Brenton Grills Entered By: Brenton Grills on 02/24/2023 11:20:51 [name] ([id number]) [id number] Page 8 of 9 -------------------------------------------------------------------------------- Wound Assessment Details Patient Name: Date of Service: TOMEO, [name] 02/24/2023 11:00 A M Medical Record Number: [id number] Patient Account Number: [id number] Date of Birth/Sex: Treating RN: 08/21/1965 (57 y.o. [name] Primary Care Kinsley Holderman: [name] Other Clinician: Referring Chandni Gagan: Treating Tyleek Smick/Extender: Donnetta Hail in Treatment: 8 Wound Status Wound Number: 1 Primary Diabetic Wound/Ulcer of the Lower Extremity Etiology: Wound Location: Left Achilles Secondary Arterial Insufficiency Ulcer Wounding Event: Gradually Appeared Etiology: Date Acquired: 12/07/2022 Wound Open Weeks Of Treatment: 8 Status: Clustered Wound: No Comorbid Cataracts, Coronary Artery Disease, Hypertension, Peripheral History: Venous Disease, Type I Diabetes, Rheumatoid Arthritis, Osteoarthritis Wound Measurements Length: (cm) 0.5 Width: (cm) 0.6 Depth: (cm) 0.2 Area: (cm) 0.236 Volume: (cm) 0.047 %  Reduction in Area: 53.1% % Reduction in Volume: 6% Epithelialization: None Tunneling: No Undermining: No Wound Description Classification: Grade 2 Wound Margin: Epibole Exudate Amount: Medium Exudate Type: Serous Exudate Color: amber Foul Odor After Cleansing: No Slough/Fibrino Yes Wound Bed Granulation Amount: Large (  67-100%) Exposed Structure Granulation Quality: Pink, Pale Fascia Exposed: No Necrotic Amount: Small (1-33%) Fat Layer (Subcutaneous Tissue) Exposed: Yes Necrotic Quality: Adherent Slough Tendon Exposed: Yes Muscle Exposed: No Joint Exposed: No Bone Exposed: No Periwound Skin Texture Texture Color No Abnormalities Noted: No No Abnormalities Noted: No Callus: No Atrophie Blanche: No Crepitus: No Cyanosis: No Excoriation: No Ecchymosis: No Induration: No Erythema: No Rash: No Hemosiderin Staining: No Scarring: No Mottled: No Pallor: No Moisture Rubor: No No Abnormalities Noted: No Dry / Scaly: No Maceration: Yes Treatment Notes Wound #1 (Achilles) Wound Laterality: Left Cleanser Soap and Water Discharge Instruction: May shower and wash wound with dial antibacterial soap and water prior to dressing change. Vashe 5.8 (oz) Discharge Instruction: Cleanse the wound with Vashe prior to applying a clean dressing using gauze sponges, not tissue or cotton balls. MOSTAFA, YUAN ([id number]) [id number] Page 9 of 9 Peri-Wound Care Skin Prep Discharge Instruction: Use skin prep as directed Topical Primary Dressing Hydrofera Blue Classic Foam, 2x2 in Discharge Instruction: Moisten with saline prior to applying to wound bed Santyl Ointment Discharge Instruction: Apply nickel thick amount to wound bed as instructed Secondary Dressing Zetuvit Plus Silicone Border Dressing 4x4 (in/in) Discharge Instruction: Apply silicone border over primary dressing as directed. Secured With Compression Wrap Compression  Stockings Add-Ons Electronic Signature(s) Signed: 02/25/2023 11:11:00 AM By: Brenton Grills Entered By: Brenton Grills on 02/24/2023 11:15:01 -------------------------------------------------------------------------------- Vitals Details Patient Name: Date of Service: [name] Pont, PA [name] J. 02/24/2023 11:00 A M Medical Record Number: [id number] Patient Account Number: [id number] Date of Birth/Sex: Treating RN: 08/25/1965 (57 y.o. [name] Primary Care Edson Deridder: [name] Other Clinician: Referring Takhia Spoon: Treating Muscab Brenneman/Extender: Donnetta Hail in Treatment: 8 Vital Signs Time Taken: 11:00 Temperature (F): 97.9 Height (in): 68 Pulse (bpm): 71 Weight (lbs): 200 Respiratory Rate (breaths/min): 18 Body Mass Index (BMI): 30.4 Blood Pressure (mmHg): 143/75 Capillary Blood Glucose (mg/dl): 782 Reference Range: 80 - 120 mg / dl Electronic Signature(s) Signed: 02/25/2023 11:11:00 AM By: Brenton Grills Entered By: Brenton Grills on 02/24/2023 11:10:08

## 2023-02-26 ENCOUNTER — Encounter (HOSPITAL_BASED_OUTPATIENT_CLINIC_OR_DEPARTMENT_OTHER): Payer: 59 | Admitting: Internal Medicine

## 2023-02-26 DIAGNOSIS — E10621 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: Secondary | ICD-10-CM | POA: Diagnosis not present

## 2023-02-26 DIAGNOSIS — S91302A Unspecified open wound, left foot, initial encounter: Secondary | ICD-10-CM

## 2023-02-26 DIAGNOSIS — I70245 Atherosclerosis of native arteries of left leg with ulceration of other part of foot: Secondary | ICD-10-CM | POA: Diagnosis not present

## 2023-02-26 DIAGNOSIS — L97528 Non-pressure chronic ulcer of other part of left foot with other specified severity: Secondary | ICD-10-CM | POA: Diagnosis not present

## 2023-02-26 DIAGNOSIS — E1051 Type 1 diabetes mellitus with diabetic peripheral angiopathy without gangrene: Secondary | ICD-10-CM | POA: Diagnosis not present

## 2023-02-26 LAB — GLUCOSE, CAPILLARY
Glucose-Capillary: 85 mg/dL (ref 70–99)
Glucose-Capillary: 85 mg/dL (ref 70–99)
Glucose-Capillary: 110 mg/dL — ABNORMAL HIGH (ref 70–99)
Glucose-Capillary: 124 mg/dL — ABNORMAL HIGH (ref 70–99)

## 2023-02-26 NOTE — Progress Notes (Signed)
BRYSYN, BRANDENBERGER ([id number]) [id number] Page 1 of 1 Visit Report for 02/26/2023 SuperBill Details Patient Name: Date of Service: [name], [name] 02/26/2023 Medical Record Number: [id number] Patient Account Number: [id number] Date of Birth/Sex: Treating RN: 1966-04-04 (57 y.o. 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 J44.9 Chronic obstructive pulmonary disease, unspecified M06.9 Rheumatoid arthritis, unspecified Z87.891 Personal history of nicotine dependence Facility Procedures CPT4 Code Description Modifier Quantity [id number] G0277-(Facility Use Only) HBOT full body chamber, , 4 ICD-10 Diagnosis Description 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 S91.302A Unspecified open wound, left foot, initial encounter 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 Physician Procedures Quantity CPT4 Code Description Modifier [id number] - WC PHYS HYPERBARIC OXYGEN THERAPY 1 ICD-10 Diagnosis Description 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

## 2023-02-26 NOTE — Progress Notes (Signed)
[name], [name] ([id number]) [id number] Page 1 of 2 Visit Report for 02/26/2023 Arrival Information Details Patient Name: Date of Service: [name], [name] 02/26/2023 7:30 A M Medical Record Number: [id number] Patient Account Number: [id number] Date of Birth/Sex: Treating RN: 11-03-1965 (57 y.o. [name] Primary Care Reta Norgren: [name] Other Clinician: [name] Referring Selicia Windom: Treating Braylen Staller/Extender: Donnetta Hail in Treatment: 9 Visit Information History Since Last Visit All ordered tests and consults were completed: Yes Patient Arrived: Ambulatory Added or deleted any medications: No Arrival Time: 07:30 Any new allergies or adverse reactions: No Accompanied By: self Had a fall or experienced change in No Transfer Assistance: None activities of daily living that may affect Patient Identification Verified: Yes risk of falls: Secondary Verification Process Completed: Yes Signs or symptoms of abuse/neglect since last visito No Patient Requires Transmission-Based Precautions: No Hospitalized since last visit: No Patient Has Alerts: No Implantable device outside of the clinic excluding No cellular tissue based products placed in the center since last visit: Pain Present Now: No Electronic Signature(s) Signed: 02/26/2023 2:15:07 PM By: [name] CHT EMT BS , , Entered By: [name] on 02/26/2023 14:15:07 -------------------------------------------------------------------------------- Encounter Discharge Information Details Patient Name: Date of Service: [name] Pont, [name] [name] 02/26/2023 7:30 A M Medical Record Number: [id number] Patient Account Number: [id number] Date of Birth/Sex: Treating RN: 12-01-1965 (57 y.o. [name] Primary Care Fahed Morten: [name] Other Clinician: [name] Referring Kasyn Stouffer: Treating Irianna Gilday/Extender: Donnetta Hail in Treatment: 9 Encounter Discharge Information Items Discharge Condition: Stable Ambulatory Status: Ambulatory Discharge Destination: Home Transportation: Private Auto Accompanied By: self Schedule Follow-up Appointment: No Clinical Summary of Care: Electronic Signature(s) Signed: 02/26/2023 2:30:15 PM By: [name] CHT EMT BS , , Entered By: [name] on 02/26/2023 14:30:15 [name] ([id number]) [id number] Page 2 of 2 -------------------------------------------------------------------------------- Vitals Details Patient Name: Date of Service: [name], [name] 02/26/2023 7:30 A M Medical Record Number: [id number] Patient Account Number: [id number] Date of Birth/Sex: Treating RN: 12-Oct-1965 (57 y.o. [name] Primary Care Zeeshan Korte: [name] Other Clinician: [name] Referring Deneice Wack: Treating Declan Adamson/Extender: Donnetta Hail in Treatment: 9 Vital Signs Time Taken: 07:33 Temperature (F): 97.3 Height (in): 68 Pulse (bpm): 69 Weight (lbs): 200 Respiratory Rate (breaths/min): 18 Body Mass Index (BMI): 30.4 Blood Pressure (mmHg): 139/64 Capillary Blood Glucose (mg/dl): 85 Reference Range: 80 - 120 mg / dl Electronic Signature(s) Signed: 02/26/2023 2:15:42 PM By: [name] CHT EMT BS , , Entered By: [name] on 02/26/2023 14:15:42

## 2023-02-26 NOTE — Progress Notes (Addendum)
[name], [name] ([id number]) [id number] Page 1 of 2 Visit Report for 02/26/2023 HBO Details Patient Name: Date of Service: [name], [name] 02/26/2023 7:30 A M Medical Record Number: [id number] Patient Account Number: [id number] Date of Birth/Sex: Treating RN: [birth year] (57 y.o. [name] Primary Care [name]: [name] Other Clinician: Haywood [name] Referring [name]: Treating [name]/Extender: [name] in Treatment: 9 HBO Treatment Course Details Treatment Course Number: 1 Ordering [name]: [name] T Treatments Ordered: otal 40 HBO Treatment Start Date: 02/16/2023 HBO Indication: Other (specify in Notes) Notes: Acute peripheral arterial insufficiency HBO Treatment Details Treatment Number: 8 Patient Type: Outpatient Chamber Type: Monoplace Chamber Serial #: [id number] Treatment Protocol: 2.0 ATA with 90 minutes oxygen, with two 5 minute air breaks Treatment Details Compression Rate Down: 1.0 psi / minute De-Compression Rate Up: 1.0 psi / minute A breaks and breathing ir Compress Tx Pressure periods Decompress Decompress Begins Reached (leave unused spaces Begins Ends blank) Chamber Pressure (ATA 1 2 2 2 2 2  --2 1 ) Clock Time (24 hr) 08:44 09:03 09:33 09:38 10:08 10:13 - - 10:43 10:58 Treatment Length: 134 (minutes) Treatment Segments: 4 Vital Signs Capillary Blood Glucose Reference Range: 80 - 120 mg / dl HBO Diabetic Blood Glucose Intervention Range: <131 mg/dl or >324 mg/dl Type: Time Vitals Blood Pulse: Respiratory Temperature: Capillary Blood Glucose Pulse Action Taken: Pressure: Rate: Glucose (mg/dl): Meter #: Oximetry (%) Taken: Pre 07:33 85 sent to cafeteria to eat Pre 08:20 139/64 69 18 97.3 85 re-check glucose Pre 08:39 110 informed physician of blood glucose and meal Post 11:01 145/73 64 18 98.1 124 none per protocol Treatment Response Treatment Toleration:  Well Treatment Completion Status: Treatment Completed without Adverse Event Treatment Notes [name] arrived with a blood glucose level of 85 mg/dL. He stated that he did not eat breakfast. He opted to go eat breakfast. Upon return, his blood glucose level was still 85 mg/dL. He stated that he ate scrambled eggs, biscuit, sausage, bacon at his visit to cafeteria. He prepared for treatment. Approximately 19 minutes later, blood glucose level was checked again with result of 110 mg/dL. Per protocol, patient was cleared by Dr. Mikey [name] to undergo treatment. After performing a safety check, patient was placed in the chamber which was compressed with 100% oxygen at a rate of 1 psi/min changing ventilation rate to 275 L/min today with understanding of how to equalize middle ear pressure. He tolerated the treatment and subsequent decompression of the chamber at 1 psi/min. He denied any problems with ear equalization although he notices his right ear is more difficult to equalize. He was using a technique that included turning his head and yawning to accomplish equalization today. His post-treatment vital signs were within normal range with blood glucose level at 124 mg/dL. He was stable upon discharge. Physician HBO Attestation: I certify that I supervised this HBO treatment in accordance with Medicare guidelines. A trained emergency response team is readily available per Yes hospital policies and procedures. Continue HBOT as ordered. Yes Electronic Signature(s) Signed: 02/26/2023 3:41:47 PM By: [name] Corwin DO Previous Signature: 02/26/2023 2:28:47 PM Version By: [name] CHT EMT BS , , [name], [name] Garter ([id number]) [id number] Page 2 of 2 Previous Signature: 02/26/2023 2:28:47 PM Version By: [name] CHT EMT BS , , Previous Signature: 02/26/2023 3:19:04 PM Version By: [name] Corwin DO Previous Signature: 02/26/2023 2:26:53 PM Version By: [name]  CHT EMT BS , , Entered By: [name] on 02/26/2023 15:38:51 -------------------------------------------------------------------------------- HBO  Safety Checklist Details Patient Name: Date of Service: [name], [name] 02/26/2023 7:30 A M Medical Record Number: [id number] Patient Account Number: [id number] Date of Birth/Sex: Treating RN: 03/21/1966 (57 y.o. [name] Primary Care Vivyan Biggers: [name] Other Clinician: Haywood [name] Referring Kierre Deines: Treating [name]/Extender: [name] in Treatment: 9 HBO Safety Checklist Items Safety Checklist Consent Form Signed Patient voided / foley secured and emptied When did you last eato Yesterday/Ate a meal at [facility] secondary Last dose of injectable or oral agent 0600 17 units Lantus Ostomy pouch emptied and vented if applicable NA All implantable devices assessed, documented and approved Freestyle Libre 2 and3 on approved list Intravenous access site secured and place NA Valuables secured Linens and cotton and cotton/polyester blend (less than 51% polyester) Personal oil-based products / skin lotions / body lotions removed Wigs or hairpieces removed NA Smoking or tobacco materials removed NA Books / newspapers / magazines / loose paper removed Cologne, aftershave, perfume and deodorant removed Jewelry removed (may wrap wedding band) Make-up removed NA Hair care products removed Libre Freestyle 2 and 3 Sensor Approved, other Battery operated devices (external) removed electronics removed. Heating patches and chemical warmers removed NA Titanium eyewear removed Nail polish cured greater than 10 hours Casting material cured greater than 10 hours Hearing aids removed NA Loose dentures or partials removed NA Prosthetics have been removed NA Patient demonstrates correct use of air break device (if applicable) Patient concerns have been addressed Patient grounding  bracelet on and cord attached to chamber Specifics for Inpatients (complete in addition to above) Medication sheet sent with patient NA Intravenous medications needed or due during therapy sent with patient NA Drainage tubes (e.g. nasogastric tube or chest tube secured and vented) NA Endotracheal or Tracheotomy tube secured NA Cuff deflated of air and inflated with saline NA Airway suctioned NA Notes Paper version used prior to treatment start. Electronic Signature(s) Signed: 02/26/2023 2:17:39 PM By: [name] CHT EMT BS , , Entered By: [name] on 02/26/2023 14:17:39

## 2023-02-27 ENCOUNTER — Encounter (HOSPITAL_BASED_OUTPATIENT_CLINIC_OR_DEPARTMENT_OTHER): Payer: 59 | Admitting: Internal Medicine

## 2023-02-27 ENCOUNTER — Telehealth: Payer: Self-pay | Admitting: Orthopedic Surgery

## 2023-02-27 DIAGNOSIS — E10621 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: Secondary | ICD-10-CM

## 2023-02-27 DIAGNOSIS — L97528 Non-pressure chronic ulcer of other part of left foot with other specified severity: Secondary | ICD-10-CM

## 2023-02-27 DIAGNOSIS — S91302A Unspecified open wound, left foot, initial encounter: Secondary | ICD-10-CM | POA: Diagnosis not present

## 2023-02-27 DIAGNOSIS — E1051 Type 1 diabetes mellitus with diabetic peripheral angiopathy without gangrene: Secondary | ICD-10-CM | POA: Diagnosis not present

## 2023-02-27 DIAGNOSIS — I70245 Atherosclerosis of native arteries of left leg with ulceration of other part of foot: Secondary | ICD-10-CM

## 2023-02-27 LAB — GLUCOSE, CAPILLARY
Glucose-Capillary: 230 mg/dL — ABNORMAL HIGH (ref 70–99)
Glucose-Capillary: 256 mg/dL — ABNORMAL HIGH (ref 70–99)

## 2023-02-27 NOTE — Progress Notes (Signed)
ELUTERIO, NICOLAOU ([id number]) [id number] Page 1 of 1 Visit Report for 02/27/2023 SuperBill Details Patient Name: Date of Service: [place], [name] 02/27/2023 Medical Record Number: [id number] Patient Account Number: [id number] Date of Birth/Sex: Treating RN: Mar 01, 1966 (57 y.o. 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 J44.9 Chronic obstructive pulmonary disease, unspecified M06.9 Rheumatoid arthritis, unspecified Z87.891 Personal history of nicotine dependence Facility Procedures CPT4 Code Description Modifier Quantity [id number] G0277-(Facility Use Only) HBOT full body chamber, , 4 ICD-10 Diagnosis Description 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 S91.302A Unspecified open wound, left foot, initial encounter 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 Physician Procedures Quantity CPT4 Code Description Modifier [id number] - WC PHYS HYPERBARIC OXYGEN THERAPY 1 ICD-10 Diagnosis Description 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

## 2023-02-27 NOTE — Progress Notes (Addendum)
[name], MISSEL ([id number]) [id number] Page 1 of 2 Visit Report for 02/27/2023 HBO Details Patient Name: Date of Service: [name], [name] 02/27/2023 7:30 A M Medical Record Number: [id number] Patient Account Number: [id number] Date of Birth/Sex: Treating RN: April 10, 1966 (57 y.o. [name], [name] Primary Care Britnie Colville: [name] Other Clinician: [name] Referring Tricha Ruggirello: Treating Graham Hyun/Extender: Donnetta Hail in Treatment: 9 HBO Treatment Course Details Treatment Course Number: 1 Ordering Abenezer Odonell: Geralyn Corwin T Treatments Ordered: otal 40 HBO Treatment Start Date: 02/16/2023 HBO Indication: Other (specify in Notes) Notes: Acute peripheral arterial insufficiency HBO Treatment Details Treatment Number: 9 Patient Type: Outpatient Chamber Type: Monoplace Chamber Serial #: [id number] Treatment Protocol: 2.0 ATA with 90 minutes oxygen, with two 5 minute air breaks Treatment Details Compression Rate Down: 1.0 psi / minute De-Compression Rate Up: 1.0 psi / minute A breaks and breathing ir Compress Tx Pressure periods Decompress Decompress Begins Reached (leave unused spaces Begins Ends blank) Chamber Pressure (ATA 1 2 2 2 2 2  --2 1 ) Clock Time (24 hr) 07:56 08:16 08:47 08:52 09:22 09:27 - - 09:56 10:09 Treatment Length: 133 (minutes) Treatment Segments: 4 Vital Signs Capillary Blood Glucose Reference Range: 80 - 120 mg / dl HBO Diabetic Blood Glucose Intervention Range: <131 mg/dl or >387 mg/dl Type: Time Vitals Blood Pulse: Respiratory Temperature: Capillary Blood Glucose Pulse Action Taken: Pressure: Rate: Glucose (mg/dl): Meter #: Oximetry (%) Taken: Pre 07:41 142/66 70 18 97.2 230 none per protocol Post 10:12 138/74 68 18 97.2 256 asymptomatic for hyperglycemia Treatment Response Treatment Toleration: Well Treatment Completion Status: Treatment Completed without Adverse Event Physician HBO  Attestation: I certify that I supervised this HBO treatment in accordance with Medicare guidelines. A trained emergency response team is readily available per Yes hospital policies and procedures. Continue HBOT as ordered. Yes Electronic Signature(s) Signed: 03/02/2023 5:19:21 PM By: Geralyn Corwin DO Previous Signature: 02/27/2023 11:03:08 AM Version By: [name] CHT EMT BS , , Previous Signature: 02/27/2023 12:44:10 PM Version By: Geralyn Corwin DO Entered By: Geralyn Corwin on 03/02/2023 09:18:44 Miyasaki, [name] ([id number]) [id number] Page 2 of 2 -------------------------------------------------------------------------------- HBO Safety Checklist Details Patient Name: Date of Service: [name], [name] 02/27/2023 7:30 A M Medical Record Number: [id number] Patient Account Number: [id number] Date of Birth/Sex: Treating RN: 1965/09/19 (57 y.o. [name], [name] Primary Care Bethene Hankinson: [name] Other Clinician: [name] Referring Aisha Greenberger: Treating Kavitha Lansdale/Extender: Donnetta Hail in Treatment: 9 HBO Safety Checklist Items Safety Checklist Consent Form Signed Patient voided / foley secured and emptied 0700 - Biscuit, sausage Croissant, egg, pop When did you last eato tart, cheese, coffee Last dose of injectable or oral agent 0630 Lantus Ostomy pouch emptied and vented if applicable NA All implantable devices assessed, documented and approved Freestyle Libre 2 and3 on approved list Intravenous access site secured and place NA Valuables secured Linens and cotton and cotton/polyester blend (less than 51% polyester) Personal oil-based products / skin lotions / body lotions removed Wigs or hairpieces removed Smoking or tobacco materials removed Books / newspapers / magazines / loose paper removed Cologne, aftershave, perfume and deodorant removed Jewelry removed (may wrap wedding band) Make-up  removed NA Hair care products removed Battery operated devices (external) removed Heating patches and chemical warmers removed Titanium eyewear removed Nail polish cured greater than 10 hours Casting material cured greater than 10 hours NA Hearing aids removed NA Loose dentures or partials removed NA Prosthetics have been removed NA Patient demonstrates correct use of  air break device (if applicable) Patient concerns have been addressed Patient grounding bracelet on and cord attached to chamber Specifics for Inpatients (complete in addition to above) Medication sheet sent with patient NA Intravenous medications needed or due during therapy sent with patient NA Drainage tubes (e.g. nasogastric tube or chest tube secured and vented) NA Endotracheal or Tracheotomy tube secured NA Cuff deflated of air and inflated with saline NA Airway suctioned NA Notes Paper version used prior to treatment start. Electronic Signature(s) Signed: 02/27/2023 11:01:20 AM By: [name] CHT EMT BS , , Entered By: [name] on 02/27/2023 11:01:20

## 2023-02-27 NOTE — Progress Notes (Addendum)
[name], [name] ([id number]) [id number] Page 1 of 2 Visit Report for 02/27/2023 Arrival Information Details Patient Name: Date of Service: [name], [name] 02/27/2023 7:30 A M Medical Record Number: [id number] Patient Account Number: [id number] Date of Birth/Sex: Treating RN: 1965-10-22 (57 y.o. [name], [name] Primary Care Providence Stivers: [name] Other Clinician: [name] Referring Bryce Kimble: Treating Sheikh Leverich/Extender: Donnetta Hail in Treatment: 9 Visit Information History Since Last Visit All ordered tests and consults were completed: Yes Patient Arrived: Ambulatory Added or deleted any medications: No Arrival Time: 07:29 Any new allergies or adverse reactions: No Accompanied By: self Had a fall or experienced change in No Transfer Assistance: None activities of daily living that may affect Patient Identification Verified: Yes risk of falls: Secondary Verification Process Completed: Yes Signs or symptoms of abuse/neglect since last visito No Patient Requires Transmission-Based Precautions: No Hospitalized since last visit: No Patient Has Alerts: No Implantable device outside of the clinic excluding No cellular tissue based products placed in the center since last visit: Pain Present Now: No Electronic Signature(s) Signed: 02/27/2023 10:49:14 AM By: [name] CHT EMT BS , , Entered By: [name] on 02/27/2023 10:49:14 -------------------------------------------------------------------------------- Encounter Discharge Information Details Patient Name: Date of Service: [name] Pont, [name] [name] 02/27/2023 7:30 A M Medical Record Number: [id number] Patient Account Number: [id number] Date of Birth/Sex: Treating RN: 1966-07-05 (57 y.o. [name] Primary Care Ousman Dise: [name] Other Clinician: [name] Referring Cael Worth: Treating Victor Granados/Extender: Donnetta Hail in Treatment: 9 Encounter Discharge Information Items Discharge Condition: Stable Ambulatory Status: Ambulatory Discharge Destination: Other (Note Required) Transportation: Other Accompanied By: self Schedule Follow-up Appointment: No Clinical Summary of Care: Notes Patient goes to work after Treatment. Electronic Signature(s) Signed: 02/27/2023 11:05:16 AM By: [name] CHT EMT BS , , Entered By: [name] on 02/27/2023 11:05:16 [name] ([id number]) [id number] Page 2 of 2 -------------------------------------------------------------------------------- Vitals Details Patient Name: Date of Service: [name], [name] 02/27/2023 7:30 A M Medical Record Number: [id number] Patient Account Number: [id number] Date of Birth/Sex: Treating RN: 10-10-1965 (57 y.o. [name], [name] Primary Care Bowie Delia: [name] Other Clinician: [name] Referring Kenna Kirn: Treating Macy Lingenfelter/Extender: Donnetta Hail in Treatment: 9 Vital Signs Time Taken: 07:41 Temperature (F): 97.2 Height (in): 68 Pulse (bpm): 70 Weight (lbs): 200 Respiratory Rate (breaths/min): 18 Body Mass Index (BMI): 30.4 Blood Pressure (mmHg): 142/66 Capillary Blood Glucose (mg/dl): 433 Reference Range: 80 - 120 mg / dl Electronic Signature(s) Signed: 02/27/2023 10:49:49 AM By: [name] CHT EMT BS , , Entered By: [name] on 02/27/2023 10:49:49

## 2023-02-27 NOTE — Telephone Encounter (Signed)
[employer] forms received. To [employer]. 

## 2023-03-02 ENCOUNTER — Other Ambulatory Visit: Payer: Self-pay

## 2023-03-02 ENCOUNTER — Encounter (HOSPITAL_BASED_OUTPATIENT_CLINIC_OR_DEPARTMENT_OTHER): Payer: 59 | Admitting: Internal Medicine

## 2023-03-02 ENCOUNTER — Encounter (HOSPITAL_COMMUNITY): Payer: Self-pay | Admitting: Orthopedic Surgery

## 2023-03-02 DIAGNOSIS — L97528 Non-pressure chronic ulcer of other part of left foot with other specified severity: Secondary | ICD-10-CM | POA: Diagnosis not present

## 2023-03-02 DIAGNOSIS — E1051 Type 1 diabetes mellitus with diabetic peripheral angiopathy without gangrene: Secondary | ICD-10-CM | POA: Diagnosis not present

## 2023-03-02 DIAGNOSIS — E10621 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: Secondary | ICD-10-CM | POA: Diagnosis not present

## 2023-03-02 DIAGNOSIS — S91302A Unspecified open wound, left foot, initial encounter: Secondary | ICD-10-CM | POA: Diagnosis not present

## 2023-03-02 DIAGNOSIS — I70245 Atherosclerosis of native arteries of left leg with ulceration of other part of foot: Secondary | ICD-10-CM

## 2023-03-02 LAB — GLUCOSE, CAPILLARY
Glucose-Capillary: 176 mg/dL — ABNORMAL HIGH (ref 70–99)
Glucose-Capillary: 160 mg/dL — ABNORMAL HIGH (ref 70–99)

## 2023-03-02 NOTE — Progress Notes (Addendum)
ASBERRY, HELMICK ([id number]) [id number] Page 1 of 2 Visit Report for 03/02/2023 Arrival Information Details Patient Name: Date of Service: [place], [name] 03/02/2023 7:30 A M Medical Record Number: [id number] Patient Account Number: [id number] Date of Birth/Sex: Treating RN: [birth year] (57 y.o. [name] Primary Care Spike Desilets: [name] Other Clinician: [name] Referring Jakie Debow: Treating Sylwia Cuervo/Extender: Donnetta Hail in Treatment: 9 Visit Information History Since Last Visit All ordered tests and consults were completed: Yes Patient Arrived: Ambulatory Added or deleted any medications: No Arrival Time: 07:43 Any new allergies or adverse reactions: No Accompanied By: self Had a fall or experienced change in No Transfer Assistance: None activities of daily living that may affect Patient Identification Verified: Yes risk of falls: Secondary Verification Process Completed: Yes Signs or symptoms of abuse/neglect since last visito No Patient Requires Transmission-Based Precautions: No Hospitalized since last visit: No Patient Has Alerts: No Implantable device outside of the clinic excluding No cellular tissue based products placed in the center since last visit: Pain Present Now: No Electronic Signature(s) Signed: 03/02/2023 9:08:19 AM By: [name] CHT EMT BS , , Entered By: [name] on 03/02/2023 09:08:19 -------------------------------------------------------------------------------- Encounter Discharge Information Details Patient Name: Date of Service: [name], [name] 03/02/2023 7:30 A M Medical Record Number: [id number] Patient Account Number: [id number] Date of Birth/Sex: Treating RN: 11/27/1965 (57 y.o. [name] Primary Care Mahamed Zalewski: [name] Other Clinician: [name] Referring Daysi Boggan: Treating Carlotta Telfair/Extender: Donnetta Hail in Treatment: 9 Encounter Discharge Information Items Discharge Condition: Stable Ambulatory Status: Ambulatory Discharge Destination: Other (Note Required) Transportation: Private Auto Accompanied By: self Schedule Follow-up Appointment: No Clinical Summary of Care: Notes Patient goes to work after treatment. Electronic Signature(s) Signed: 03/02/2023 11:57:46 AM By: [name] CHT EMT BS , , Entered By: [name] on 03/02/2023 11:57:46 Horen, [name] ([id number]) [id number] Page 2 of 2 -------------------------------------------------------------------------------- Vitals Details Patient Name: Date of Service: [place], [name] 03/02/2023 7:30 A M Medical Record Number: [id number] Patient Account Number: [id number] Date of Birth/Sex: Treating RN: [birth year] (57 y.o. [name] Primary Care Calyb Mcquarrie: [name] Other Clinician: [name] Referring Terrence Pizana: Treating Darriana Deboy/Extender: Donnetta Hail in Treatment: 9 Vital Signs Time Taken: 07:52 Temperature (F): 97.3 Height (in): 68 Pulse (bpm): 62 Weight (lbs): 200 Respiratory Rate (breaths/min): 18 Body Mass Index (BMI): 30.4 Blood Pressure (mmHg): 126/62 Capillary Blood Glucose (mg/dl): 433 Reference Range: 80 - 120 mg / dl Electronic Signature(s) Signed: 03/02/2023 9:10:41 AM By: [name] CHT EMT BS , , Entered By: [name] on 03/02/2023 09:10:41

## 2023-03-02 NOTE — Progress Notes (Signed)
AKIRA, UTSLER ([id number]) [id number] Page 1 of 2 Visit Report for 03/02/2023 HBO Details Patient Name: Date of Service: [place], [name] 03/02/2023 7:30 A M Medical Record Number: [id number] Patient Account Number: [id number] Date of Birth/Sex: Treating RN: July 03, 1966 (57 y.o. [name] Primary Care Jarvis Knodel: [name] Other Clinician: [name] Referring Renuka Farfan: Treating Jo-Ann Johanning/Extender: Donnetta Hail in Treatment: 9 HBO Treatment Course Details Treatment Course Number: 1 Ordering Ximenna Fonseca: Geralyn Corwin T Treatments Ordered: otal 40 HBO Treatment Start Date: 02/16/2023 HBO Indication: Other (specify in Notes) Notes: Acute peripheral arterial insufficiency HBO Treatment Details Treatment Number: 10 Patient Type: Outpatient Chamber Type: Monoplace Chamber Serial #: [id number] Treatment Protocol: 2.0 ATA with 90 minutes oxygen, with two 5 minute air breaks Treatment Details Compression Rate Down: 1.0 psi / minute De-Compression Rate Up: 1.5 psi / minute A breaks and breathing ir Compress Tx Pressure periods Decompress Decompress Begins Reached (leave unused spaces Begins Ends blank) Chamber Pressure (ATA 1 2 2 2 2 2  --2 1 ) Clock Time (24 hr) 08:10 08:26 08:56 09:01 09:32 09:37 - - 10:06 10:19 Treatment Length: 129 (minutes) Treatment Segments: 4 Vital Signs Capillary Blood Glucose Reference Range: 80 - 120 mg / dl HBO Diabetic Blood Glucose Intervention Range: <131 mg/dl or >725 mg/dl Type: Time Vitals Blood Respiratory Capillary Blood Glucose Pulse Action Pulse: Temperature: Taken: Pressure: Rate: Glucose (mg/dl): Meter #: Oximetry (%) Taken: Pre 07:52 126/62 62 18 97.3 176 1 none per protocol Post 10:22 132/83 61 18 97.1 160 none per protocol Treatment Response Treatment Toleration: Well Treatment Completion Status: Treatment Completed without Adverse Event Treatment Notes Mr.  Carillo arrived with normal vital signs. He prepared for treatment. After performing a safety check, he was placed in the chamber which was compressed at a rate of 1 psi/min after confirming normal ear equalization (approximately 5 psig). Prior to that ventilation was on 450 L/min to reduce travel rate to approximately 0.74 psi/min. He tolerated treatment and subsequent decompression at a rate of 1.5 psi/min. He denied any issues with ear equalization and/or pain associated with barotrauma. He was stable upon discharge. Physician HBO Attestation: I certify that I supervised this HBO treatment in accordance with Medicare guidelines. A trained emergency response team is readily available per Yes hospital policies and procedures. Continue HBOT as ordered. Yes Electronic Signature(s) Signed: 03/02/2023 5:19:21 PM By: Geralyn Corwin DO Previous Signature: 03/02/2023 11:54:59 AM Version By: [name] CHT EMT BS , , Entered By: Geralyn Corwin on 03/02/2023 16:22:25 [name] ([id number]) [id number] Page 2 of 2 -------------------------------------------------------------------------------- HBO Safety Checklist Details Patient Name: Date of Service: [place], [name] 03/02/2023 7:30 A M Medical Record Number: [id number] Patient Account Number: [id number] Date of Birth/Sex: Treating RN: [birth year] (57 y.o. [name] Primary Care Shelton Soler: [name] Other Clinician: [name] Referring Salli Bodin: Treating Amir Fick/Extender: Donnetta Hail in Treatment: 9 HBO Safety Checklist Items Safety Checklist Consent Form Signed Patient voided / foley secured and emptied When did you last eato 0630- Waffle Coffie, sausage Last dose of injectable or oral agent 0630 - Lantus Ostomy pouch emptied and vented if applicable NA All implantable devices assessed, documented and approved Freestyle Libre 2 and3 on approved  list Intravenous access site secured and place NA Valuables secured Linens and cotton and cotton/polyester blend (less than 51% polyester) Personal oil-based products / skin lotions / body lotions removed Wigs or hairpieces removed NA Smoking or tobacco materials removed NA Books / newspapers /  magazines / loose paper removed Cologne, aftershave, perfume and deodorant removed Jewelry removed (may wrap wedding band) Make-up removed Hair care products removed Battery operated devices (external) removed Heating patches and chemical warmers removed Titanium eyewear removed Nail polish cured greater than 10 hours NA Casting material cured greater than 10 hours NA Hearing aids removed NA Loose dentures or partials removed NA Prosthetics have been removed NA Patient demonstrates correct use of air break device (if applicable) Patient concerns have been addressed Patient grounding bracelet on and cord attached to chamber Specifics for Inpatients (complete in addition to above) Medication sheet sent with patient NA Intravenous medications needed or due during therapy sent with patient NA Drainage tubes (e.g. nasogastric tube or chest tube secured and vented) NA Endotracheal or Tracheotomy tube secured NA Cuff deflated of air and inflated with saline NA Airway suctioned NA Notes Paper version used prior to treatment start. Electronic Signature(s) Signed: 03/02/2023 9:12:41 AM By: [name] CHT EMT BS , , Entered By: [name] on 03/02/2023 09:12:41

## 2023-03-02 NOTE — Progress Notes (Addendum)
[name] denies chest pain or shortness of breath. Patient denies having any s/s of Covid in his household, also denies any known exposure to Covid.  [name] denies  any s/s of upper or lower respiratory in the past 8 weeks.   [name] PCP is Dr. Ivery Quale.  Patient has type I diabetes.  Patient wears a Free style Libre glucose reader.  [name] reports that CBG's run 130-190, last A1C was 6.3 4 months ago at [facility] office.  I instructed [name] to take 8 units of Lantus at hs on Tuesday and 8 units of Lantus Wednesday am, if CBG is greater than 70. If CBG is less than 70, drink 1/2 of cranberry, apple or grape juice- then check CBG In 15 minutes later.  [name]'s cardiologist is Dr. Sherilyn Banker, Pulmonologist is Dr. Sherene Sires. Rheumatology specialist is Alben Deeds.

## 2023-03-03 ENCOUNTER — Encounter (HOSPITAL_BASED_OUTPATIENT_CLINIC_OR_DEPARTMENT_OTHER): Payer: 59 | Admitting: Internal Medicine

## 2023-03-03 ENCOUNTER — Ambulatory Visit: Payer: 59 | Admitting: Cardiology

## 2023-03-03 DIAGNOSIS — L97528 Non-pressure chronic ulcer of other part of left foot with other specified severity: Secondary | ICD-10-CM | POA: Diagnosis not present

## 2023-03-03 DIAGNOSIS — S91302A Unspecified open wound, left foot, initial encounter: Secondary | ICD-10-CM | POA: Diagnosis not present

## 2023-03-03 DIAGNOSIS — I70245 Atherosclerosis of native arteries of left leg with ulceration of other part of foot: Secondary | ICD-10-CM | POA: Diagnosis not present

## 2023-03-03 DIAGNOSIS — E1051 Type 1 diabetes mellitus with diabetic peripheral angiopathy without gangrene: Secondary | ICD-10-CM | POA: Diagnosis not present

## 2023-03-03 DIAGNOSIS — E10621 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: Secondary | ICD-10-CM

## 2023-03-03 LAB — GLUCOSE, CAPILLARY
Glucose-Capillary: 203 mg/dL — ABNORMAL HIGH (ref 70–99)
Glucose-Capillary: 201 mg/dL — ABNORMAL HIGH (ref 70–99)

## 2023-03-03 NOTE — Progress Notes (Signed)
patient voiced understanding of new arrival time of 0600 tomorrow

## 2023-03-03 NOTE — Progress Notes (Signed)
[name], [name] ([id number]) [id number] Page 1 of 1 Visit Report for 03/02/2023 SuperBill Details Patient Name: Date of Service: [place], [name] 03/02/2023 Medical Record Number: [id number] Patient Account Number: [id number] Date of Birth/Sex: Treating RN: [birth year] (57 y.o. 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 J44.9 Chronic obstructive pulmonary disease, unspecified M06.9 Rheumatoid arthritis, unspecified Z87.891 Personal history of nicotine dependence Facility Procedures CPT4 Code Description Modifier Quantity [id number] G0277-(Facility Use Only) HBOT full body chamber, , 4 ICD-10 Diagnosis Description 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 S91.302A Unspecified open wound, left foot, initial encounter 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 Physician Procedures Quantity CPT4 Code Description Modifier [id number] - WC PHYS HYPERBARIC OXYGEN THERAPY 1 ICD-10 Diagnosis Description 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

## 2023-03-03 NOTE — Progress Notes (Signed)
THERMON, [name] ([id number]) [id number] Page 1 of 7 Visit Report for 01/12/2023 Chief Complaint Document Details Patient Name: Date of Service: [place], [name] 01/12/2023 9:30 A M Medical Record Number: [id number] Patient Account Number: [id number] Date of Birth/Sex: Treating RN: 1966-04-23 (57 y.o. M) Primary Care Provider: [name] Other Clinician: Referring Provider: Treating Provider/Extender: Donnetta Hail in Treatment: 2 Information Obtained from: Patient Chief Complaint 12/25/2022; wound to the left Achilles region Electronic Signature(s) Signed: 01/12/2023 4:48:17 PM By: Geralyn Corwin DO Entered By: Geralyn Corwin on 01/12/2023 10:15:50 -------------------------------------------------------------------------------- HPI Details Patient Name: Date of Service: [name] Pont, [name] [name] 01/12/2023 9:30 A M Medical Record Number: [id number] Patient Account Number: [id number] Date of Birth/Sex: Treating RN: 04-Dec-1965 (57 y.o. M) Primary Care Provider: [name] Other Clinician: Referring Provider: Treating Provider/Extender: Donnetta Hail in Treatment: 2 History of Present Illness HPI Description: 12/25/2022 [name] is a 57 year old male with a past medical history of controlled insulin-dependent type 1 diabetes, COPD and rheumatoid arthritis that presents the clinic for a 1 month history of nonhealing ulcer to the left foot. On 03/2020 patient had surgery for left fifth metatarsal fracture and subsequently had hardware removal in March 2022 due to infection. Over the past year he has experienced pain to the lateral left foot however the incision site and wound has healed. 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 He follows with Dr. [name] for this issue. Due to pain to the lateral left foot he uses  Hoka sneakers however this rubbed at the back of his heel creating a wound to the Achilles region. He has since changed his shoe wear to boots that come up above this area or crocs. He has been able to offload this area over the past couple weeks. He is currently on doxycycline, pentoxifylline, nitroglycerin patch and cilostazol by Dr. [name] for his wound care. Currently patient denies signs of infection. ABI in office was 1.3 and he is scheduled for arterial duplex studies on 01/28/23. 5/30; patient presents for follow-up. He has been using Santyl to the wound bed. He had his ABIs completed that showed noncompressible on the left with TBI of 0.35. Currently denies signs of infection. 6/10; patient presents for follow-up. He has been using Santyl to the wound bed. He had been referred to vein and vascular at our last clinic visit however he cannot be seen until 6/25. He is concerned about the wound. We will call today to see if we can facilitate an earlier appointment. He denies signs of infection. He has a 40 pack smoking history and currently smokes about half a pack a day. Electronic Signature(s) Signed: 01/12/2023 4:48:17 PM By: Geralyn Corwin DO Entered By: Geralyn Corwin on 01/12/2023 10:24:37 [name] ([id number]) [id number] Page 2 of 7 -------------------------------------------------------------------------------- Physical Exam Details Patient Name: Date of Service: [name], [name] 01/12/2023 9:30 A M Medical Record Number: [id number] Patient Account Number: [id number] Date of Birth/Sex: Treating RN: 11/07/1965 (57 y.o. M) Primary Care Provider: [name] Other Clinician: Referring Provider: Treating Provider/Extender: Donnetta Hail in Treatment: 2 Constitutional respirations regular, non-labored and within target range for patient.. Cardiovascular 2+ dorsalis pedis/posterior tibialis  pulses. Psychiatric pleasant and cooperative. Notes T the Achilles region there is an open wound with nonviable tissue throughout no signs of infection including increased warmth, Erythema, or purulent drainage. o Minimal pain on palpation. No  increased swelling. Electronic Signature(s) Signed: 01/12/2023 4:48:17 PM By: Geralyn Corwin DO Entered By: Geralyn Corwin on 01/12/2023 10:25:38 -------------------------------------------------------------------------------- Physician Orders Details Patient Name: Date of Service: [name] Pont, [name] [name] 01/12/2023 9:30 A M Medical Record Number: [id number] Patient Account Number: [id number] Date of Birth/Sex: Treating RN: 07/16/1966 (57 y.o. [name]) Primary Care Provider: [name] Other Clinician: Referring Provider: Treating Provider/Extender: Donnetta Hail in Treatment: 2 Verbal / Phone Orders: No Diagnosis Coding Follow-up Appointments ppointment in 2 weeks. - Dr. Mikey Bussing Thursday 01/15/2023 @ 0845 Return A Anesthetic Wound #1 Left Achilles (In clinic) Topical Lidocaine 4% applied to wound bed Bathing/ Shower/ Hygiene May shower and wash wound with soap and water. - clean wound with Dial antibacterial soap, then apply new dressing Wound Treatment Wound #1 - Achilles Wound Laterality: Left Cleanser: Soap and Water 1 x Per Day/15 Days Discharge Instructions: May shower and wash wound with dial antibacterial soap and water prior to dressing change. Cleanser: Vashe 5.8 (oz) 1 x Per Day/15 Days Discharge Instructions: Cleanse the wound with Vashe prior to applying a clean dressing using gauze sponges, not tissue or cotton balls. Peri-Wound Care: Skin Prep (Generic) 1 x Per Day/15 Days Discharge Instructions: Use skin prep as directed Prim Dressing: Santyl Ointment ary 1 x Per Day/15 Days [name], AUPPERLE ([id number]) [id number] Page 3 of 7 Discharge Instructions: Apply  nickel thick amount to wound bed as instructed Secondary Dressing: Zetuvit Plus Silicone Border Dressing 4x4 (in/in) (Generic) 1 x Per Day/15 Days Discharge Instructions: Apply silicone border over primary dressing as directed. Electronic Signature(s) Signed: 01/12/2023 4:48:17 PM By: Geralyn Corwin DO Signed: 01/12/2023 6:00:35 PM By: Shawn Stall RN, BSN Entered By: Shawn Stall on 01/12/2023 10:26:28 -------------------------------------------------------------------------------- Problem List Details Patient Name: Date of Service: [name] Pont, [name] [name] 01/12/2023 9:30 A M Medical Record Number: [id number] Patient Account Number: [id number] Date of Birth/Sex: Treating RN: 10-16-1965 (57 y.o. [name] Primary Care Provider: [name] Other Clinician: Referring Provider: Treating Provider/Extender: Donnetta Hail in Treatment: 2 Active Problems ICD-10 Encounter Code Description Active Date MDM Diagnosis S91.302A Unspecified open wound, left foot, initial encounter 12/25/2022 No Yes L97.528 Non-pressure chronic ulcer of other part of left foot with other specified 12/25/2022 No Yes severity 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 12/25/2022 No Yes J44.9 Chronic obstructive pulmonary disease, unspecified 12/25/2022 No Yes M06.9 Rheumatoid arthritis, unspecified 12/25/2022 No Yes Inactive Problems Resolved Problems Electronic Signature(s) Signed: 01/12/2023 4:48:17 PM By: Geralyn Corwin DO Entered By: Geralyn Corwin on 01/12/2023 10:15:26 Progress Note Details -------------------------------------------------------------------------------- [name] ([id number]) [id number] Page 4 of 7 Patient Name: Date of Service: [name], [name] 01/12/2023 9:30 A M Medical Record Number: [id number] Patient Account Number: [id number] Date of Birth/Sex: Treating RN: [birth year] (57 y.o. M) Primary Care Provider:  [name] Other Clinician: Referring Provider: Treating Provider/Extender: Donnetta Hail in Treatment: 2 Subjective Chief Complaint Information obtained from Patient 12/25/2022; wound to the left Achilles region History of Present Illness (HPI) 12/25/2022 Mr. Khaleeq Lubin is a 57 year old male with a past medical history of controlled insulin-dependent type 1 diabetes, COPD and rheumatoid arthritis that presents the clinic for a 1 month history of nonhealing ulcer to the left foot. On 03/2020 patient had surgery for left fifth metatarsal fracture and subsequently had hardware removal in March 2022 due to infection. Over the past year he has experienced pain to the lateral left foot however the incision site and wound has  healed. 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 He follows with Dr. [name] for this issue. Due to pain to the lateral left foot he uses Hoka sneakers however this rubbed at the back of his heel creating a wound to the Achilles region. He has since changed his shoe wear to boots that come up above this area or crocs. He has been able to offload this area over the past couple weeks. He is currently on doxycycline, pentoxifylline, nitroglycerin patch and cilostazol by Dr. [name] for his wound care. Currently patient denies signs of infection. ABI in office was 1.3 and he is scheduled for arterial duplex studies on 01/28/23. 5/30; patient presents for follow-up. He has been using Santyl to the wound bed. He had his ABIs completed that showed noncompressible on the left with TBI of 0.35. Currently denies signs of infection. 6/10; patient presents for follow-up. He has been using Santyl to the wound bed. He had been referred to vein and vascular at our last clinic visit however he cannot be seen until 6/25. He is concerned about the wound. We will call today to see if we can  facilitate an earlier appointment. He denies signs of infection. He has a 40 pack smoking history and currently smokes about half a pack a day. Patient History Information obtained from Patient. Family History Cancer - Father,Mother, Heart Disease - Father, Hypertension - Father, Seizures - Mother, No family history of Diabetes, Kidney Disease, Lung Disease, Stroke, Thyroid Problems, Tuberculosis. Social History Current every day smoker - 40+years, Marital Status - Divorced, Alcohol Use - Never, Drug Use - No History, Caffeine Use - Daily. Medical History Eyes Patient has history of Cataracts Cardiovascular Patient has history of Coronary Artery Disease, Hypertension, Peripheral Venous Disease Endocrine Patient has history of Type I Diabetes Musculoskeletal Patient has history of Rheumatoid Arthritis, Osteoarthritis Hospitalization/Surgery History - internal fixation of left 5th toe fracture. - hardware removal 2022 left foot. - 2020 elbow drained. - 2019 excision oral tumor. - 2019 mouth surgery. - 2017 cardiac cath. 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 - carpal tunnel release. - cataract extraction, bilateral. Medical A Surgical History Notes nd Eyes diabetic retinopathy Respiratory pleural effusion, bilateral Genitourinary CKDIII Musculoskeletal Rheumatoid nodulosis Osteopenia Objective Constitutional respirations regular, non-labored and within target range for patient.. Vitals Time Taken: 9:29 AM, Height: 68 in, Weight: 200 lbs, BMI: 30.4, Temperature: 98.3 F, Pulse: 73 bpm, Respiratory Rate: 18 breaths/min, Blood Pressure: 151/73 mmHg, Capillary Blood Glucose: 76 mg/dl. Cardiovascular 2+ dorsalis pedis/posterior tibialis pulses. Psychiatric [name], [name] ([id number]) [id number] Page 5 of 7 pleasant and cooperative. General Notes: T the Achilles region there is an open wound with nonviable tissue throughout no signs of infection including increased warmth,  Erythema, or o purulent drainage. Minimal pain on palpation. No increased swelling. Integumentary (Hair, Skin) Wound #1 status is Open. Original cause of wound was Gradually Appeared. The date acquired was: 12/07/2022. The wound has been in treatment 2 weeks. The wound is located on the Left Achilles. The wound measures 0.8cm length x 0.8cm width x 0.1cm depth; 0.503cm^2 area and 0.05cm^3 volume. There is Fat Layer (Subcutaneous Tissue) exposed. There is no tunneling or undermining noted. There is a medium amount of serosanguineous drainage noted. The wound margin is distinct with the outline attached to the wound base. There is no granulation within the wound bed. There is a large (67-100%) amount of necrotic tissue within the wound bed including  Adherent Slough. The periwound skin appearance did not exhibit: Callus, Crepitus, Excoriation, Induration, Rash, Scarring, Dry/Scaly, Maceration, Atrophie Blanche, Cyanosis, Ecchymosis, Hemosiderin Staining, Mottled, Pallor, Rubor, Erythema. Assessment Active Problems ICD-10 Unspecified open wound, left foot, initial encounter Non-pressure chronic ulcer of other part of left foot with other specified severity 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 Chronic obstructive pulmonary disease, unspecified Rheumatoid arthritis, unspecified Patient's wound is stable. No signs of infection on exam. He has a longstanding history of tobacco use and type 1 diabetes. He is at risk for amputation. He has concerning arterial studies and he was referred to vein and vascular and scheduled to be seen on 6/25. We will try and facilitate an earlier appointment For critical limb ischemia. In the meantime continue aggressive offloading and Santyl daily. Discussed the importance of tobacco sensation for his wound healing. Follow-up in 1 week. Plan Follow-up Appointments: Return Appointment in 2 weeks. - Dr. Mikey Bussing Thursday 01/15/2023 @ 0845 Anesthetic: Wound #1 Left  Achilles: (In clinic) Topical Lidocaine 4% applied to wound bed Bathing/ Shower/ Hygiene: May shower and wash wound with soap and water. - clean wound with Dial antibacterial soap, then apply new dressing WOUND #1: - Achilles Wound Laterality: Left Cleanser: Soap and Water 1 x Per Day/15 Days Discharge Instructions: May shower and wash wound with dial antibacterial soap and water prior to dressing change. Cleanser: Vashe 5.8 (oz) 1 x Per Day/15 Days Discharge Instructions: Cleanse the wound with Vashe prior to applying a clean dressing using gauze sponges, not tissue or cotton balls. Peri-Wound Care: Skin Prep (Generic) 1 x Per Day/15 Days Discharge Instructions: Use skin prep as directed Prim Dressing: Santyl Ointment 1 x Per Day/15 Days ary Discharge Instructions: Apply nickel thick amount to wound bed as instructed Secondary Dressing: Zetuvit Plus Silicone Border Dressing 4x4 (in/in) (Generic) 1 x Per Day/15 Days Discharge Instructions: Apply silicone border over primary dressing as directed. 1. Santyl 2. Aggressive offloading 3. Follow-up in 1 week 4. Help facilitate earlier appointment with vein and vascular Electronic Signature(s) Signed: 03/03/2023 12:34:47 PM By: Geralyn Corwin DO Previous Signature: 01/12/2023 4:48:17 PM Version By: Geralyn Corwin DO Entered By: Geralyn Corwin on 03/03/2023 12:13:40 -------------------------------------------------------------------------------- HxROS Details Patient Name: Date of Service: Rambo, [name] [name] 01/12/2023 9:30 A M Medical Record Number: [id number] Patient Account Number: [id number] [name], [name] ([id number]) [id number] Page 6 of 7 Date of Birth/Sex: Treating RN: [birth year] (57 y.o. M) Primary Care Provider: Other Clinician: [name] Referring Provider: Treating Provider/Extender: Donnetta Hail in Treatment: 2 Information Obtained  From Patient Eyes Medical History: Positive for: Cataracts Past Medical History Notes: diabetic retinopathy Respiratory Medical History: Past Medical History Notes: pleural effusion, bilateral Cardiovascular Medical History: Positive for: Coronary Artery Disease; Hypertension; Peripheral Venous Disease Endocrine Medical History: Positive for: Type I Diabetes Treated with: Insulin Blood sugar tested every day: Yes Tested : Genitourinary Medical History: Past Medical History Notes: CKDIII Musculoskeletal Medical History: Positive for: Rheumatoid Arthritis; Osteoarthritis Past Medical History Notes: Rheumatoid nodulosis Osteopenia HBO Extended History Items Eyes: Cataracts Immunizations Pneumococcal Vaccine: Received Pneumococcal Vaccination: No Implantable Devices None Hospitalization / Surgery History Type of Hospitalization/Surgery internal fixation of left 5th toe fracture hardware removal 2022 left foot 2020 elbow drained 2019 excision oral tumor 2019 mouth surgery 2017 cardiac cath 2015 CABG carpal tunnel release cataract extraction, bilateral Family and Social History Cancer: Yes - Father,Mother; Diabetes: No; Heart Disease: Yes - Father; Hypertension: Yes - Father; Kidney Disease: No; Lung Disease: No; Seizures: Yes -  Mother; Stroke: No; Thyroid Problems: No; Tuberculosis: No; Current every day smoker - 40+years; Marital Status - Divorced; Alcohol Use: Never; Drug Use: No History; Caffeine Use: Daily; Financial Concerns: No; Food, Clothing or Shelter Needs: No; Support System Lacking: No; Transportation Concerns: No IBAAD, KHOURY ([id number]) [id number] Page 7 of 7 Electronic Signature(s) Signed: 01/12/2023 4:48:17 PM By: Geralyn Corwin DO Entered By: Geralyn Corwin on 01/12/2023 10:24:42 -------------------------------------------------------------------------------- SuperBill Details Patient Name: Date of  Service: [name] Pont, [name] [name] 01/12/2023 Medical Record Number: [id number] Patient Account Number: [id number] Date of Birth/Sex: Treating RN: [birth year] (57 y.o. [name], [name] Primary Care Provider: [name] Other Clinician: Referring Provider: Treating Provider/Extender: Donnetta Hail in Treatment: 2 Diagnosis Coding ICD-10 Codes Code Description [phone] Unspecified open wound, left foot, initial encounter L97.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 J44.9 Chronic obstructive pulmonary disease, unspecified M06.9 Rheumatoid arthritis, unspecified Facility Procedures : CPT4 Code: [id number] Description: 99213 - WOUND CARE VISIT-LEV 3 EST PT Modifier: Quantity: 1 Physician Procedures : CPT4 Code Description Modifier [id number] - WC PHYS LEVEL 3 - EST PT ICD-10 Diagnosis Description S91.302A Unspecified open wound, left foot, initial encounter L97.528 Non-pressure chronic ulcer of other part of left foot with other specified  severity 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 J44.9 Chronic obstructive pulmonary disease, unspecified Quantity: 1 Electronic Signature(s) Signed: 01/12/2023 4:48:17 PM By: Geralyn Corwin DO Entered By: Geralyn Corwin on 01/12/2023 10:31:03

## 2023-03-03 NOTE — Progress Notes (Signed)
ROBART, BARTELSON ([id number]) [id number] Page 1 of 10 Visit Report for 03/02/2023 Arrival Information Details Patient Name: Date of Service: [name], [name] 03/02/2023 11:00 A M Medical Record Number: [id number] Patient Account Number: [id number] Date of Birth/Sex: Treating RN: 08/19/1965 (57 y.o. [name]: [name] Other Clinician: Referring Adriyana Greenbaum: Treating Brighten Buzzelli/Extender: Donnetta Hail in Treatment: 9 Visit Information History Since Last Visit Added or deleted any medications: No Patient Arrived: Ambulatory Any new allergies or adverse reactions: No Arrival Time: 11:00 Had a fall or experienced change in No Accompanied By: self activities of daily living that may affect Transfer Assistance: None risk of falls: Patient Identification Verified: Yes Signs or symptoms of abuse/neglect since last visito No Secondary Verification Process Completed: Yes Hospitalized since last visit: No Patient Requires Transmission-Based Precautions: No Implantable device outside of the clinic excluding No Patient Has Alerts: No cellular tissue based products placed in the center since last visit: Has Dressing in Place as Prescribed: Yes Has Compression in Place as Prescribed: Yes Pain Present Now: No Electronic Signature(s) Signed: 03/02/2023 5:33:27 PM By: Redmond Pulling RN, BSN Entered By: Redmond Pulling on 03/02/2023 11:01:33 -------------------------------------------------------------------------------- Clinic Level of Care Assessment Details Patient Name: Date of Service: [name], [name] [name] J. 03/02/2023 11:00 A M Medical Record Number: [id number] Patient Account Number: [id number] Date of Birth/Sex: Treating RN: Jun 06, 1966 (58 y.o. [name] Primary Care Xavian Hardcastle: [name] Other Clinician: Referring Miles Borkowski: Treating Sameer Teeple/Extender: Donnetta Hail  in Treatment: 9 Clinic Level of Care Assessment Items TOOL 4 Quantity Score X- 1 0 Use when only an EandM is performed on FOLLOW-UP visit ASSESSMENTS - Nursing Assessment / Reassessment X- 1 10 Reassessment of Co-morbidities (includes updates in patient status) X- 1 5 Reassessment of Adherence to Treatment Plan ASSESSMENTS - Wound and Skin A ssessment / Reassessment X - Simple Wound Assessment / Reassessment - one wound 1 5 []  - 0 Complex Wound Assessment / Reassessment - multiple wounds []  - 0 Dermatologic / Skin Assessment (not related to wound area) ASSESSMENTS - Focused Assessment X- 1 5 Circumferential Edema Measurements - multi extremities []  - 0 Nutritional Assessment / Counseling / Intervention [name] ([id number]) [id number] Page 2 of 10 []  - 0 Lower Extremity Assessment (monofilament, tuning fork, pulses) []  - 0 Peripheral Arterial Disease Assessment (using hand held doppler) ASSESSMENTS - Ostomy and/or Continence Assessment and Care []  - 0 Incontinence Assessment and Management []  - 0 Ostomy Care Assessment and Management (repouching, etc.) PROCESS - Coordination of Care X - Simple Patient / Family Education for ongoing care 1 15 []  - 0 Complex (extensive) Patient / Family Education for ongoing care X- 1 10 Staff obtains Chiropractor, Records, T Results / Process Orders est []  - 0 Staff telephones HHA, Nursing Homes / Clarify orders / etc []  - 0 Routine Transfer to another Facility (non-emergent condition) []  - 0 Routine Hospital Admission (non-emergent condition) []  - 0 New Admissions / Manufacturing engineer / Ordering NPWT Apligraf, etc. , []  - 0 Emergency Hospital Admission (emergent condition) []  - 0 Simple Discharge Coordination []  - 0 Complex (extensive) Discharge Coordination PROCESS - Special Needs []  - 0 Pediatric / Minor Patient Management []  - 0 Isolation Patient Management []  - 0 Hearing / Language /  Visual special needs []  - 0 Assessment of Community assistance (transportation, D/C planning, etc.) []  - 0 Additional assistance / Altered mentation []  - 0 Support Surface(s) Assessment (bed, cushion, seat, etc.) INTERVENTIONS -  Wound Cleansing / Measurement X - Simple Wound Cleansing - one wound 1 5 []  - 0 Complex Wound Cleansing - multiple wounds X- 1 5 Wound Imaging (photographs - any number of wounds) []  - 0 Wound Tracing (instead of photographs) X- 1 5 Simple Wound Measurement - one wound []  - 0 Complex Wound Measurement - multiple wounds INTERVENTIONS - Wound Dressings X - Small Wound Dressing one or multiple wounds 1 10 []  - 0 Medium Wound Dressing one or multiple wounds []  - 0 Large Wound Dressing one or multiple wounds []  - 0 Application of Medications - topical []  - 0 Application of Medications - injection INTERVENTIONS - Miscellaneous []  - 0 External ear exam []  - 0 Specimen Collection (cultures, biopsies, blood, body fluids, etc.) []  - 0 Specimen(s) / Culture(s) sent or taken to Lab for analysis []  - 0 Patient Transfer (multiple staff / Nurse, adult / Similar devices) []  - 0 Simple Staple / Suture removal (25 or less) []  - 0 Complex Staple / Suture removal (26 or more) []  - 0 Hypo / Hyperglycemic Management (close monitor of Blood Glucose) [name] ([id number]) [id number] Page 3 of 10 []  - 0 Ankle / Brachial Index (ABI) - do not check if billed separately X- 1 5 Vital Signs Has the patient been seen at the hospital within the last three years: Yes Total Score: 80 Level Of Care: New/Established - Level 3 Electronic Signature(s) Signed: 03/02/2023 5:33:27 PM By: Redmond Pulling RN, BSN Entered By: Redmond Pulling on 03/02/2023 11:46:07 -------------------------------------------------------------------------------- Encounter Discharge Information Details Patient Name: Date of Service: [name], [name] [name] J. 03/02/2023 11:00  A M Medical Record Number: [id number] Patient Account Number: [id number] Date of Birth/Sex: Treating RN: 06-Dec-1965 (57 y.o. [name] Primary Care [name]: [name] Other Clinician: Referring Rieley Khalsa: Treating Maliq Pilley/Extender: Donnetta Hail in Treatment: 9 Encounter Discharge Information Items Discharge Condition: Stable Ambulatory Status: Ambulatory Discharge Destination: Home Transportation: Private Auto Accompanied By: self Schedule Follow-up Appointment: Yes Clinical Summary of Care: Patient Declined Electronic Signature(s) Signed: 03/02/2023 5:33:27 PM By: Redmond Pulling RN, BSN Entered By: Redmond Pulling on 03/02/2023 11:47:05 -------------------------------------------------------------------------------- Lower Extremity Assessment Details Patient Name: Date of Service: [name], [name] [name] J. 03/02/2023 11:00 A M Medical Record Number: [id number] Patient Account Number: [id number] Date of Birth/Sex: Treating RN: 02/24/1966 (57 y.o. [name] Primary Care Antonino Nienhuis: [name] Other Clinician: Referring Kingston Shawgo: Treating Jeryn Bertoni/Extender: Donnetta Hail in Treatment: 9 Edema Assessment Assessed: [name]: No] [Right: No] Edema: [Left: N] [Right: o] Calf Left: Right: Point of Measurement: 32 cm From Medial Instep 35 cm Ankle Left: Right: Point of Measurement: 13 cm From Medial Instep 20.5 cm Vascular Assessment Left: [[id number] Page 4 of 10Right:] Pulses: Dorsalis Pedis Palpable: [phone].pdf Page 4 of 10Yes] Extremity colors, hair growth, and conditions: Extremity Color: [phone].pdf Page 4 of 10Normal] Hair Growth on Extremity: [phone].pdf Page 4 of 10Yes] Temperature of Extremity: [phone].pdf Page 4 of 10Warm] Capillary Refill:  [phone].pdf Page 4 of 10< 3 seconds] Dependent Rubor: [phone].pdf Page 4 of 10No No] Toe Nail Assessment Left: Right: Thick: No Discolored: No Deformed: No Improper Length and Hygiene: No Electronic Signature(s) Signed: 03/02/2023 5:33:27 PM By: Redmond Pulling RN, BSN Entered By: Redmond Pulling on 03/02/2023 11:04:53 -------------------------------------------------------------------------------- Multi Wound Chart Details Patient Name: Date of Service: [name], [name] [name] J. 03/02/2023 11:00 A M Medical Record Number: [id number] Patient Account Number: [id number] Date of Birth/Sex: Treating RN: 02/24/1966 (57 y.o. 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 N/A N/A Secondary Etiology: Cataracts, Coronary Artery Disease, N/A N/A Comorbid History: Hypertension, Peripheral Venous Disease, Type I Diabetes, Rheumatoid Arthritis, Osteoarthritis 12/07/2022 N/A N/A Date Acquired: 9 N/A N/A Weeks of Treatment: Open N/A N/A Wound Status: No N/A N/A Wound Recurrence: 0.5x0.6x0.2 N/A N/A Measurements L x W x D (cm) 0.236 N/A N/A A (cm) : rea 0.047 N/A N/A Volume (cm) : 53.10% N/A N/A % Reduction in Area: 6.00% N/A N/A % Reduction in VolumeANSIL, [name] ([id number]) [id number] Page 5 of 10 Grade 2 N/A N/A Classification: Medium N/A N/A Exudate A mount: Serous N/A  N/A Exudate Type: amber N/A N/A Exudate Color: Epibole N/A N/A Wound Margin: Large (67-100%) N/A N/A Granulation A mount: Pink, Pale N/A N/A Granulation Quality: Small (1-33%) N/A N/A Necrotic A mount: Fat Layer (Subcutaneous Tissue): Yes N/A N/A Exposed Structures: Tendon: Yes Fascia: No Muscle: No Joint: No Bone: No None N/A N/A Epithelialization: Excoriation: No N/A N/A Periwound Skin Texture: Induration: No Callus: No Crepitus: No Rash: No Scarring: No Maceration: Yes N/A N/A Periwound Skin Moisture: Dry/Scaly: No Atrophie Blanche: No N/A N/A Periwound Skin Color: Cyanosis: No Ecchymosis: No Erythema: No Hemosiderin Staining: No Mottled: No Pallor: No Rubor: No Treatment Notes Wound #1 (Achilles) Wound Laterality: Left Cleanser Soap and Water Discharge Instruction: May shower and wash wound with dial antibacterial soap and water prior to dressing change. Vashe 5.8 (oz) Discharge Instruction: Cleanse the wound with Vashe prior to applying a clean dressing using gauze sponges, not tissue or cotton balls. Peri-Wound Care Skin Prep Discharge Instruction: Use skin prep as directed Topical Primary Dressing Hydrofera Blue Classic Foam, 2x2 in Discharge Instruction: Moisten with saline prior to applying to wound bed Santyl Ointment Discharge Instruction: Apply nickel thick amount to wound bed as instructed Secondary Dressing Zetuvit Plus Silicone Border Dressing 4x4 (in/in) Discharge Instruction: Apply silicone border over primary dressing as directed. Secured With Compression Wrap Compression Stockings Add-Ons Electronic Signature(s) Signed: 03/02/2023 5:19:21 PM By: Geralyn Corwin DO Entered By: Geralyn Corwin on 03/02/2023 14:37:18 [place], [name] ([id number]) [id number] Page 6 of 10 -------------------------------------------------------------------------------- Multi-Disciplinary Care Plan Details Patient Name: Date  of Service: [name], [name] 03/02/2023 11:00 A M Medical Record Number: [id number] Patient Account Number: [id number] Date of Birth/Sex: Treating RN: 1966/07/08 (57 y.o. [name] Primary Care Bernie Fobes: [name] Other Clinician: Referring Caliana Spires: Treating Kingdavid Leinbach/Extender: Donnetta Hail in Treatment: 9 Active Inactive HBO Nursing Diagnoses: Potential for barotraumas to ears, sinuses, teeth, and lungs or cerebral gas embolism related to changes in atmospheric pressure inside hyperbaric oxygen chamber Potential for oxygen toxicity seizures related to delivery of 100% oxygen at an increased atmospheric pressure Potential for pulmonary oxygen toxicity related to delivery of 100% oxygen at an increased atmospheric pressure Goals: Patient and/or family will be able to state/discuss factors appropriate to the management of their disease process during treatment Date Initiated: 02/16/2023 Target Resolution Date: 04/10/2023 Goal Status: Active Patient will tolerate the hyperbaric oxygen therapy treatment Date Initiated: 02/16/2023 Target Resolution Date: 04/10/2023 Goal Status: Active Patient/caregiver will verbalize understanding of HBO goals, rationale, procedures and potential hazards Date Initiated:  02/16/2023 Target Resolution Date: 04/10/2023 Goal Status: Active Interventions: Administer the correct therapeutic gas delivery based on the patients needs and limitations, per physician order Assess and provide for patients comfort related to the hyperbaric environment and equalization of middle ear Assess for signs and symptoms related to adverse events, including but not limited to confinement anxiety, pneumothorax, oxygen toxicity and baurotrauma Assess patient for any history of confinement anxiety Assess patient's knowledge and expectations regarding hyperbaric medicine and provide education related to the hyperbaric environment, goals of treatment  and prevention of adverse events Implement protocols to decrease risk of pneumothorax in high risk patients Notes: Wound/Skin Impairment Nursing Diagnoses: Impaired tissue integrity Knowledge deficit related to smoking impact on wound healing Knowledge deficit related to ulceration/compromised skin integrity Goals: Patient will have a decrease in wound volume by X% from date: (specify in notes) Date Initiated: 12/25/2022 Target Resolution Date: 04/02/2023 Goal Status: Active Patient/caregiver will verbalize understanding of skin care regimen Date Initiated: 12/25/2022 Target Resolution Date: 04/03/2023 Goal Status: Active Ulcer/skin breakdown will have a volume reduction of 30% by week 4 Date Initiated: 12/25/2022 Date Inactivated: 01/27/2023 Target Resolution Date: 01/22/2023 Unmet Reason: no major changes; Goal Status: Unmet continue current treatment. Interventions: Assess patient/caregiver ability to obtain necessary supplies Assess patient/caregiver ability to perform ulcer/skin care regimen upon admission and as needed Assess ulceration(s) every visit Provide education on smoking Provide education on ulcer and skin care Notes: Electronic Signature(s) Signed: 03/02/2023 5:33:27 PM By: Redmond Pulling RN, BSN Entered By: Redmond Pulling on 03/02/2023 11:14:09 [name] ([id number]) [id number] Page 7 of 10 -------------------------------------------------------------------------------- Pain Assessment Details Patient Name: Date of Service: [name], [name] 03/02/2023 11:00 A M Medical Record Number: [id number] Patient Account Number: [id number] Date of Birth/Sex: Treating RN: 09-20-1965 (57 y.o. [name] Primary Care Rosalea Withrow: [name] Other Clinician: Referring Nabeel Gladson: Treating Tyheim Vanalstyne/Extender: Donnetta Hail in Treatment: 9 Active Problems Location of Pain Severity and Description of  Pain Patient Has Paino No Site Locations Pain Management and Medication Current Pain Management: Electronic Signature(s) Signed: 03/02/2023 5:33:27 PM By: Redmond Pulling RN, BSN Entered By: Redmond Pulling on 03/02/2023 11:03:09 -------------------------------------------------------------------------------- Patient/Caregiver Education Details Patient Name: Date of Service: Poorman, [name] [name] J. 7/29/2024andnbsp11:00 A M Medical Record Number: [id number] Patient Account Number: [id number] Date of Birth/Gender: Treating RN: 10-24-1965 (57 y.o. [name] Primary Care Physician: [name] Other Clinician: Referring Physician: Treating Physician/Extender: Donnetta Hail in Treatment: 9 Education Assessment Education Provided To: Patient Education Topics Provided Wound/Skin Impairment: Methods: Explain/Verbal [name], [name] ([id number]) [id number] Page 8 of 10 Responses: State content correctly Electronic Signature(s) Signed: 03/02/2023 5:33:27 PM By: Redmond Pulling RN, BSN Entered By: Redmond Pulling on 03/02/2023 11:20:34 -------------------------------------------------------------------------------- Wound Assessment Details Patient Name: Date of Service: [name], [name] [name] J. 03/02/2023 11:00 A M Medical Record Number: [id number] Patient Account Number: [id number] Date of Birth/Sex: Treating RN: 08-10-1965 (57 y.o. [name] Primary Care Kynsie Falkner: [name] Other Clinician: Referring Carma Dwiggins: Treating Josalynn Johndrow/Extender: Donnetta Hail in Treatment: 9 Wound Status Wound Number: 1 Primary Diabetic Wound/Ulcer of the Lower Extremity Etiology: Wound Location: Left Achilles Secondary Arterial Insufficiency Ulcer Wounding Event: Gradually Appeared Etiology: Date Acquired: 12/07/2022 Wound Open Weeks Of Treatment: 9 Status: Clustered Wound: No Comorbid Cataracts, Coronary  Artery Disease, Hypertension, Peripheral History: Venous Disease, Type I Diabetes, Rheumatoid Arthritis, Osteoarthritis Photos Wound Measurements Length: (cm) 0.5 Width: (cm) 0.6 Depth: (cm) 0.2 Area: (cm) 0.236 Volume: (cm) 0.047 % Reduction in Area: 53.1% %  Reduction in Volume: 6% Epithelialization: None Tunneling: No Undermining: No Wound Description Classification: Grade 2 Wound Margin: Epibole Exudate Amount: Medium Exudate Type: Serous Exudate Color: amber Foul Odor After Cleansing: No Slough/Fibrino Yes Wound Bed Granulation Amount: Large (67-100%) Exposed Structure Granulation Quality: Pink, Pale Fascia Exposed: No Necrotic Amount: Small (1-33%) Fat Layer (Subcutaneous Tissue) Exposed: Yes Necrotic Quality: Adherent Slough Tendon Exposed: Yes Muscle Exposed: No Joint Exposed: No Bone Exposed: No [address] [name], [name] ([id number]) [id number] Page 9 of 10 No Abnormalities Noted: No No Abnormalities Noted: No Callus: No Atrophie Blanche: No Crepitus: No Cyanosis: No Excoriation: No Ecchymosis: No Induration: No Erythema: No Rash: No Hemosiderin Staining: No Scarring: No Mottled: No Pallor: No Moisture Rubor: No No Abnormalities Noted: No Dry / Scaly: No Maceration: Yes Treatment Notes Wound #1 (Achilles) Wound Laterality: Left Cleanser Soap and Water Discharge Instruction: May shower and wash wound with dial antibacterial soap and water prior to dressing change. Vashe 5.8 (oz) Discharge Instruction: Cleanse the wound with Vashe prior to applying a clean dressing using gauze sponges, not tissue or cotton balls. Peri-Wound Care Skin Prep Discharge Instruction: Use skin prep as directed Topical Primary Dressing Hydrofera Blue Classic Foam, 2x2 in Discharge Instruction: Moisten with saline prior to applying to wound bed Santyl Ointment Discharge Instruction: Apply nickel thick amount to  wound bed as instructed Secondary Dressing Zetuvit Plus Silicone Border Dressing 4x4 (in/in) Discharge Instruction: Apply silicone border over primary dressing as directed. Secured With Compression Wrap Compression Stockings Facilities manager) Signed: 03/02/2023 4:41:11 PM By: Thayer Dallas Signed: 03/02/2023 5:33:27 PM By: Redmond Pulling RN, BSN Entered By: Thayer Dallas on 03/02/2023 11:07:51 -------------------------------------------------------------------------------- Vitals Details Patient Name: Date of Service: [name], [name] [name] J. 03/02/2023 11:00 A M Medical Record Number: [id number] Patient Account Number: [id number] Date of Birth/Sex: Treating RN: Apr 25, 1966 (57 y.o. [name] Primary Care Dnyla Antonetti: [name] Other Clinician: Referring Gian Ybarra: Treating Jessye Imhoff/Extender: Donnetta Hail in Treatment: 9 Vital Signs Time Taken: 11:00 Temperature (F): 97.8 Height (in): 68 Pulse (bpm): 61 Weight (lbs): 200 Respiratory Rate (breaths/min): 18 Body Mass Index (BMI): 30.4 Blood Pressure (mmHg): 142/59 Capillary Blood Glucose (mg/dl): 329 [name], [name] ([id number]) [id number] Page 10 of 10 Reference Range: 80 - 120 mg / dl Electronic Signature(s) Signed: 03/02/2023 5:33:27 PM By: Redmond Pulling RN, BSN Entered By: Redmond Pulling on 03/02/2023 11:02:50

## 2023-03-03 NOTE — Progress Notes (Addendum)
ROCKLYN, PORTEE ([id number]) [id number] Page 1 of 2 Visit Report for 03/03/2023 HBO Details Patient Name: Date of Service: [place], [name] 03/03/2023 7:30 A M Medical Record Number: [id number] Patient Account Number: [id number] Date of Birth/Sex: Treating RN: [birth year] (57 y.o. [name] Primary Care Shimshon Narula: [name] Other Clinician: [name] Referring Aston Lawhorn: Treating Cameryn Schum/Extender: Donnetta Hail in Treatment: 9 HBO Treatment Course Details Treatment Course Number: 1 Ordering Devontae Casasola: Geralyn Corwin T Treatments Ordered: otal 40 HBO Treatment Start Date: 02/16/2023 HBO Indication: Other (specify in Notes) Notes: Acute peripheral arterial insufficiency HBO Treatment Details Treatment Number: 11 Patient Type: Outpatient Chamber Type: Monoplace Chamber Serial #: [id number] Treatment Protocol: 2.0 ATA with 90 minutes oxygen, with two 5 minute air breaks Treatment Details Compression Rate Down: 1.0 psi / minute De-Compression Rate Up: 1.5 psi / minute A breaks and breathing ir Compress Tx Pressure periods Decompress Decompress Begins Reached (leave unused spaces Begins Ends blank) Chamber Pressure (ATA 1 2 2 2 2 2  --2 1 ) Clock Time (24 hr) 07:57 08:11 08:41 08:46 09:16 09:21 - - 09:51 10:03 Treatment Length: 126 (minutes) Treatment Segments: 4 Vital Signs Capillary Blood Glucose Reference Range: 80 - 120 mg / dl HBO Diabetic Blood Glucose Intervention Range: <131 mg/dl or >086 mg/dl Type: Time Vitals Blood Pulse: Respiratory Temperature: Capillary Blood Glucose Pulse Action Taken: Pressure: Rate: Glucose (mg/dl): Meter #: Oximetry (%) Taken: Pre 07:40 135/68 64 18 97.3 203 none per protocol Post 10:06 134/58 59 18 98.1 201 asymptomatic for bradycardia Treatment Response Treatment Toleration: Well Treatment Completion Status: Treatment Completed without Adverse Event Treatment  Notes Mr. Borchers arrived with normal vital signs. He prepared for treatment. After performing a safety check, he was placed in the chamber which was compressed at a rate of 1.5 psi/min after confirming normal ear equalization. He tolerated treatment and subsequent decompression at a rate of 1.5 psi/min. He denied any issues with ear equalization and/or pain associated with barotrauma. Post-treatment heart rate was 59 bpm. He was asymptomatic for bradycardia. He was stable upon discharge. Physician HBO Attestation: I certify that I supervised this HBO treatment in accordance with Medicare guidelines. A trained emergency response team is readily available per Yes hospital policies and procedures. Continue HBOT as ordered. Yes Electronic Signature(s) Signed: 03/03/2023 4:37:23 PM By: Geralyn Corwin DO Previous Signature: 03/03/2023 1:33:01 PM Version By: [name] CHT EMT BS , , Previous Signature: 03/03/2023 1:32:38 PM Version By: [name] CHT EMT BS , , Entered By: Geralyn Corwin on 03/03/2023 15:22:01 [name] ([id number]) [id number] Page 2 of 2 -------------------------------------------------------------------------------- HBO Safety Checklist Details Patient Name: Date of Service: [place], [name] 03/03/2023 7:30 A M Medical Record Number: [id number] Patient Account Number: [id number] Date of Birth/Sex: Treating RN: 23-Nov-1965 (57 y.o. [name] Primary Care Kaiser Belluomini: [name] Other Clinician: [name] Referring Anwen Cannedy: Treating Salif Tay/Extender: Donnetta Hail in Treatment: 9 HBO Safety Checklist Items Safety Checklist Consent Form Signed Patient voided / foley secured and emptied When did you last eato 0630 - Egg sandwich, coffee Last dose of injectable or oral agent Last Night, 17 U Lantus Ostomy pouch emptied and vented if applicable NA All implantable devices assessed,  documented and approved Freestyle Libre 2 and3 on approved list Intravenous access site secured and place NA Valuables secured Linens and cotton and cotton/polyester blend (less than 51% polyester) Personal oil-based products / skin lotions / body lotions removed Wigs or hairpieces removed NA Smoking  or tobacco materials removed NA Books / newspapers / magazines / loose paper removed Cologne, aftershave, perfume and deodorant removed Jewelry removed (may wrap wedding band) Make-up removed NA Hair care products removed Battery operated devices (external) removed Heating patches and chemical warmers removed Titanium eyewear removed Nail polish cured greater than 10 hours NA Casting material cured greater than 10 hours NA Hearing aids removed NA Loose dentures or partials removed NA Prosthetics have been removed NA Patient demonstrates correct use of air break device (if applicable) Patient concerns have been addressed Patient grounding bracelet on and cord attached to chamber Specifics for Inpatients (complete in addition to above) Medication sheet sent with patient NA Intravenous medications needed or due during therapy sent with patient NA Drainage tubes (e.g. nasogastric tube or chest tube secured and vented) NA Endotracheal or Tracheotomy tube secured NA Cuff deflated of air and inflated with saline NA Airway suctioned NA Notes Paper version used prior to treatment start. Electronic Signature(s) Signed: 03/03/2023 1:29:19 PM By: [name] CHT EMT BS , , Entered By: [name] on 03/03/2023 13:29:18

## 2023-03-03 NOTE — Progress Notes (Addendum)
LEBERT, VUKOVICH ([id number]) [id number] Page 1 of 2 Visit Report for 03/03/2023 Arrival Information Details Patient Name: Date of Service: [name], [name] 03/03/2023 7:30 A M Medical Record Number: [id number] Patient Account Number: [id number] Date of Birth/Sex: Treating RN: [birth year] (57 y.o. [name] Primary Care Lenell Lama: [name] Other Clinician: [name] Referring Alyssia Heese: Treating Damarien Nyman/Extender: Donnetta Hail in Treatment: 9 Visit Information History Since Last Visit All ordered tests and consults were completed: Yes Patient Arrived: Ambulatory Added or deleted any medications: No Arrival Time: 07:28 Any new allergies or adverse reactions: No Accompanied By: self Had a fall or experienced change in No Transfer Assistance: None activities of daily living that may affect Patient Identification Verified: Yes risk of falls: Secondary Verification Process Completed: Yes Signs or symptoms of abuse/neglect since last visito No Patient Requires Transmission-Based Precautions: No Hospitalized since last visit: No Patient Has Alerts: No Implantable device outside of the clinic excluding No cellular tissue based products placed in the center since last visit: Pain Present Now: No Electronic Signature(s) Signed: 03/03/2023 11:19:34 AM By: [name] CHT EMT BS , , Entered By: [name] on 03/03/2023 11:19:34 -------------------------------------------------------------------------------- Encounter Discharge Information Details Patient Name: Date of Service: [name] Pont, [name] [name] 03/03/2023 7:30 A M Medical Record Number: [id number] Patient Account Number: [id number] Date of Birth/Sex: Treating RN: [birth year] (57 y.o. [name] Primary Care Vannie Hochstetler: [name] Other Clinician: [name] Referring Avenell Sellers: Treating Cliffton Spradley/Extender: Donnetta Hail in Treatment: 9 Encounter Discharge Information Items Discharge Condition: Stable Ambulatory Status: Ambulatory Discharge Destination: Other (Note Required) Transportation: Private Auto Accompanied By: self Schedule Follow-up Appointment: No Clinical Summary of Care: Notes Patient goes to work after treatment. Electronic Signature(s) Signed: 03/03/2023 1:34:56 PM By: [name] CHT EMT BS , , Entered By: [name] on 03/03/2023 13:34:56 [name], [name] ([id number]) [id number] Page 2 of 2 -------------------------------------------------------------------------------- Vitals Details Patient Name: Date of Service: [name], [name] 03/03/2023 7:30 A M Medical Record Number: [id number] Patient Account Number: [id number] Date of Birth/Sex: Treating RN: 1966/01/21 (57 y.o. [name] Primary Care Kama Cammarano: [name] Other Clinician: Karl Bales Referring Shivan Hodes: Treating Florette Thai/Extender: Donnetta Hail in Treatment: 9 Vital Signs Time Taken: 07:40 Temperature (F): 97.3 Height (in): 68 Pulse (bpm): 64 Weight (lbs): 200 Respiratory Rate (breaths/min): 18 Body Mass Index (BMI): 30.4 Blood Pressure (mmHg): 135/68 Capillary Blood Glucose (mg/dl): 601 Reference Range: 80 - 120 mg / dl Electronic Signature(s) Signed: 03/03/2023 11:21:23 AM By: [name] CHT EMT BS , , Previous Signature: 03/03/2023 11:20:27 AM Version By: [name] CHT EMT BS , , Entered By: [name] on 03/03/2023 11:21:23

## 2023-03-03 NOTE — Progress Notes (Signed)
[name], [name] ([id number]) [id number] Page 1 of 1 Visit Report for 03/03/2023 SuperBill Details Patient Name: Date of Service: [name], [name] 03/03/2023 Medical Record Number: [id number] Patient Account Number: [id number] Date of Birth/Sex: Treating RN: 08-27-1965 (57 y.o. [name] Primary Care Provider: [name] Other Clinician: Haywood [name] Referring Provider: Treating Provider/Extender: Donnetta Hail in Treatment: 9 Diagnosis Coding ICD-10 Codes Code Description [phone] Unspecified open wound, left foot, initial encounter 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 J44.9 Chronic obstructive pulmonary disease, unspecified M06.9 Rheumatoid arthritis, unspecified Z87.891 Personal history of nicotine dependence Facility Procedures CPT4 Code Description Modifier Quantity [id number] G0277-(Facility Use Only) HBOT full body chamber, , 4 ICD-10 Diagnosis Description 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 S91.302A Unspecified open wound, left foot, initial encounter 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 Physician Procedures Quantity CPT4 Code Description Modifier [id number] 99183 - WC PHYS HYPERBARIC OXYGEN THERAPY 1 ICD-10 Diagnosis Description 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 S91.302A Unspecified open wound, left foot, initial encounter 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

## 2023-03-04 ENCOUNTER — Ambulatory Visit (HOSPITAL_COMMUNITY)
Admission: RE | Admit: 2023-03-04 | Discharge: 2023-03-04 | Disposition: A | Discharge status: Home / Self Care | Payer: 59 | Attending: Orthopedic Surgery | Admitting: Orthopedic Surgery

## 2023-03-04 ENCOUNTER — Ambulatory Visit (HOSPITAL_BASED_OUTPATIENT_CLINIC_OR_DEPARTMENT_OTHER): Payer: 59 | Admitting: Anesthesiology

## 2023-03-04 ENCOUNTER — Other Ambulatory Visit: Payer: Self-pay

## 2023-03-04 ENCOUNTER — Ambulatory Visit (HOSPITAL_COMMUNITY): Payer: 59 | Admitting: Anesthesiology

## 2023-03-04 ENCOUNTER — Encounter (HOSPITAL_COMMUNITY): Payer: Self-pay | Admitting: Orthopedic Surgery

## 2023-03-04 ENCOUNTER — Ambulatory Visit: Payer: 59

## 2023-03-04 ENCOUNTER — Encounter (HOSPITAL_COMMUNITY)
Admission: RE | Disposition: A | Discharge status: Home / Self Care | Payer: Self-pay | Source: Home / Self Care | Attending: Orthopedic Surgery

## 2023-03-04 DIAGNOSIS — J4489 Other specified chronic obstructive pulmonary disease: Secondary | ICD-10-CM | POA: Diagnosis not present

## 2023-03-04 DIAGNOSIS — G4733 Obstructive sleep apnea (adult) (pediatric): Secondary | ICD-10-CM | POA: Insufficient documentation

## 2023-03-04 DIAGNOSIS — Z951 Presence of aortocoronary bypass graft: Secondary | ICD-10-CM | POA: Insufficient documentation

## 2023-03-04 DIAGNOSIS — L97423 Non-pressure chronic ulcer of left heel and midfoot with necrosis of muscle: Secondary | ICD-10-CM

## 2023-03-04 DIAGNOSIS — M069 Rheumatoid arthritis, unspecified: Secondary | ICD-10-CM | POA: Diagnosis not present

## 2023-03-04 DIAGNOSIS — E1022 Type 1 diabetes mellitus with diabetic chronic kidney disease: Secondary | ICD-10-CM | POA: Diagnosis not present

## 2023-03-04 DIAGNOSIS — Z794 Long term (current) use of insulin: Secondary | ICD-10-CM | POA: Insufficient documentation

## 2023-03-04 DIAGNOSIS — E10622 Type 1 diabetes mellitus with other skin ulcer: Secondary | ICD-10-CM | POA: Insufficient documentation

## 2023-03-04 DIAGNOSIS — F419 Anxiety disorder, unspecified: Secondary | ICD-10-CM | POA: Diagnosis not present

## 2023-03-04 DIAGNOSIS — L97329 Non-pressure chronic ulcer of left ankle with unspecified severity: Secondary | ICD-10-CM

## 2023-03-04 DIAGNOSIS — I2510 Atherosclerotic heart disease of native coronary artery without angina pectoris: Secondary | ICD-10-CM | POA: Diagnosis not present

## 2023-03-04 DIAGNOSIS — L97829 Non-pressure chronic ulcer of other part of left lower leg with unspecified severity: Secondary | ICD-10-CM | POA: Diagnosis present

## 2023-03-04 DIAGNOSIS — N183 Chronic kidney disease, stage 3 unspecified: Secondary | ICD-10-CM | POA: Insufficient documentation

## 2023-03-04 DIAGNOSIS — Z87891 Personal history of nicotine dependence: Secondary | ICD-10-CM | POA: Diagnosis not present

## 2023-03-04 DIAGNOSIS — Z09 Encounter for follow-up examination after completed treatment for conditions other than malignant neoplasm: Secondary | ICD-10-CM | POA: Insufficient documentation

## 2023-03-04 HISTORY — DX: Peripheral vascular disease, unspecified: I73.9

## 2023-03-04 HISTORY — PX: I & D EXTREMITY: SHX5045

## 2023-03-04 LAB — CBC
HCT: 35.6 % — ABNORMAL LOW (ref 39.0–52.0)
Hemoglobin: 11.2 g/dL — ABNORMAL LOW (ref 13.0–17.0)
MCH: 29.9 pg (ref 26.0–34.0)
MCHC: 31.5 g/dL (ref 30.0–36.0)
MCV: 95.2 fL (ref 80.0–100.0)
Platelets: 267 K/uL (ref 150–400)
RBC: 3.74 MIL/uL — ABNORMAL LOW (ref 4.22–5.81)
RDW: 13.7 % (ref 11.5–15.5)
WBC: 11.4 K/uL — ABNORMAL HIGH (ref 4.0–10.5)
nRBC: 0 % (ref 0.0–0.2)

## 2023-03-04 LAB — BASIC METABOLIC PANEL
Anion gap: 8 (ref 5–15)
BUN: 24 mg/dL — ABNORMAL HIGH (ref 6–20)
CO2: 26 mmol/L (ref 22–32)
Calcium: 8.6 mg/dL — ABNORMAL LOW (ref 8.9–10.3)
Chloride: 101 mmol/L (ref 98–111)
Creatinine, Ser: 1.55 mg/dL — ABNORMAL HIGH (ref 0.61–1.24)
GFR, Estimated: 51 mL/min — ABNORMAL LOW (ref >60)
Glucose, Bld: 216 mg/dL — ABNORMAL HIGH (ref 70–99)
Potassium: 3.9 mmol/L (ref 3.5–5.1)
Sodium: 135 mmol/L (ref 135–145)

## 2023-03-04 LAB — AEROBIC/ANAEROBIC CULTURE W GRAM STAIN (SURGICAL/DEEP WOUND): Gram Stain: NONE SEEN

## 2023-03-04 LAB — GLUCOSE, CAPILLARY
Glucose-Capillary: 180 mg/dL — ABNORMAL HIGH (ref 70–99)
Glucose-Capillary: 153 mg/dL — ABNORMAL HIGH (ref 70–99)
Glucose-Capillary: 151 mg/dL — ABNORMAL HIGH (ref 70–99)

## 2023-03-04 SURGERY — IRRIGATION AND DEBRIDEMENT EXTREMITY
Anesthesia: Monitor Anesthesia Care | Site: Ankle | Laterality: Left

## 2023-03-04 MED ORDER — 0.9 % SODIUM CHLORIDE (POUR BTL) OPTIME
TOPICAL | Status: DC | PRN
  Administered 2023-03-04: 1000 mL

## 2023-03-04 MED ORDER — LACTATED RINGERS IV SOLN: INTRAVENOUS | Status: DC

## 2023-03-04 MED ORDER — FENTANYL CITRATE (PF) 100 MCG/2ML IJ SOLN
INTRAMUSCULAR | Status: AC
  Administered 2023-03-04: 50 ug via INTRAVENOUS
  Filled 2023-03-04: qty 2

## 2023-03-04 MED ORDER — ONDANSETRON HCL 4 MG/2ML IJ SOLN
INTRAMUSCULAR | Status: DC | PRN
  Administered 2023-03-04: 4 mg via INTRAVENOUS

## 2023-03-04 MED ORDER — MIDAZOLAM HCL 2 MG/2ML IJ SOLN
INTRAMUSCULAR | Status: DC | PRN
  Administered 2023-03-04 (×2): 1 mg via INTRAVENOUS

## 2023-03-04 MED ORDER — MIDAZOLAM HCL 2 MG/2ML IJ SOLN
INTRAMUSCULAR | Status: AC
  Filled 2023-03-04: qty 2

## 2023-03-04 MED ORDER — ONDANSETRON HCL 4 MG/2ML IJ SOLN
INTRAMUSCULAR | Status: AC
  Filled 2023-03-04: qty 2

## 2023-03-04 MED ORDER — DEXAMETHASONE SODIUM PHOSPHATE 10 MG/ML IJ SOLN
INTRAMUSCULAR | Status: AC
  Filled 2023-03-04: qty 1

## 2023-03-04 MED ORDER — LIDOCAINE 2% (20 MG/ML) 5 ML SYRINGE
INTRAMUSCULAR | Status: AC
  Filled 2023-03-04: qty 5

## 2023-03-04 MED ORDER — GLYCOPYRROLATE PF 0.2 MG/ML IJ SOSY
PREFILLED_SYRINGE | INTRAMUSCULAR | Status: AC
  Filled 2023-03-04: qty 1

## 2023-03-04 MED ORDER — OXYCODONE HCL 5 MG PO TABS: 5.0000 mg | ORAL_TABLET | Freq: Once | ORAL | Status: DC | PRN

## 2023-03-04 MED ORDER — PROPOFOL 10 MG/ML IV BOLUS
INTRAVENOUS | Status: AC
  Filled 2023-03-04: qty 20

## 2023-03-04 MED ORDER — FENTANYL CITRATE (PF) 100 MCG/2ML IJ SOLN: 25.0000 ug | INTRAMUSCULAR | Status: DC | PRN

## 2023-03-04 MED ORDER — DEXAMETHASONE SODIUM PHOSPHATE 10 MG/ML IJ SOLN
INTRAMUSCULAR | Status: DC | PRN
Start: 2023-03-04 — End: 2023-03-04
  Administered 2023-03-04: 5 mg via INTRAVENOUS

## 2023-03-04 MED ORDER — FENTANYL CITRATE (PF) 250 MCG/5ML IJ SOLN
INTRAMUSCULAR | Status: AC
  Filled 2023-03-04: qty 5

## 2023-03-04 MED ORDER — PROPOFOL 1000 MG/100ML IV EMUL
INTRAVENOUS | Status: AC
  Filled 2023-03-04: qty 100

## 2023-03-04 MED ORDER — OXYCODONE HCL 5 MG/5ML PO SOLN: 5.0000 mg | Freq: Once | ORAL | Status: DC | PRN

## 2023-03-04 MED ORDER — HYDROCODONE-ACETAMINOPHEN 5-325 MG PO TABS
1.0000 | ORAL_TABLET | ORAL | 0 refills | Status: DC | PRN
Start: 2023-03-04 — End: 2023-06-03

## 2023-03-04 MED ORDER — PROPOFOL 500 MG/50ML IV EMUL
INTRAVENOUS | Status: DC | PRN
  Administered 2023-03-04: 25 ug/kg/min via INTRAVENOUS

## 2023-03-04 MED ORDER — CEFAZOLIN SODIUM-DEXTROSE 2-4 GM/100ML-% IV SOLN
2.0000 g | INTRAVENOUS | Status: AC
  Administered 2023-03-04: 2 g via INTRAVENOUS
  Filled 2023-03-04: qty 100

## 2023-03-04 MED ORDER — ORAL CARE MOUTH RINSE: 15.0000 mL | Freq: Once | OROMUCOSAL | Status: AC

## 2023-03-04 MED ORDER — FENTANYL CITRATE (PF) 100 MCG/2ML IJ SOLN: 50.0000 ug | Freq: Once | INTRAMUSCULAR | Status: AC

## 2023-03-04 MED ORDER — MIDAZOLAM HCL 2 MG/2ML IJ SOLN: 2.0000 mg | Freq: Once | INTRAMUSCULAR | Status: AC

## 2023-03-04 MED ORDER — INSULIN ASPART 100 UNIT/ML IJ SOLN: 0.0000 [IU] | INTRAMUSCULAR | Status: DC | PRN

## 2023-03-04 MED ORDER — CHLORHEXIDINE GLUCONATE 0.12 % MT SOLN
15.0000 mL | Freq: Once | OROMUCOSAL | Status: AC
  Administered 2023-03-04: 15 mL via OROMUCOSAL

## 2023-03-04 MED ORDER — ONDANSETRON HCL 4 MG/2ML IJ SOLN: 4.0000 mg | Freq: Four times a day (QID) | INTRAMUSCULAR | Status: DC | PRN

## 2023-03-04 MED ORDER — PROPOFOL 1000 MG/100ML IV EMUL
INTRAVENOUS | Status: AC
  Filled 2023-03-04: qty 300

## 2023-03-04 MED ORDER — LIDOCAINE HCL (CARDIAC) PF 100 MG/5ML IV SOSY
PREFILLED_SYRINGE | INTRAVENOUS | Status: DC | PRN
Start: 2023-03-04 — End: 2023-03-04
  Administered 2023-03-04: 50 mg via INTRATRACHEAL

## 2023-03-04 MED ORDER — BUPIVACAINE HCL (PF) 0.5 % IJ SOLN
INTRAMUSCULAR | Status: DC | PRN
  Administered 2023-03-04: 15 mL via PERINEURAL
  Administered 2023-03-04: 25 mL via PERINEURAL

## 2023-03-04 MED ORDER — MIDAZOLAM HCL 2 MG/2ML IJ SOLN
INTRAMUSCULAR | Status: AC
  Administered 2023-03-04: 2 mg via INTRAVENOUS
  Filled 2023-03-04: qty 2

## 2023-03-04 SURGICAL SUPPLY — 42 items
BAG COUNTER SPONGE SURGICOUNT (BAG) IMPLANT
BAG SPNG CNTER NS LX DISP (BAG)
BLADE SURG 21 STRL SS (BLADE) ×1 IMPLANT
BNDG CMPR 5X4 CHSV STRCH STRL (GAUZE/BANDAGES/DRESSINGS) ×1
BNDG CMPR 5X6 CHSV STRCH STRL (GAUZE/BANDAGES/DRESSINGS)
BNDG COHESIVE 4X5 TAN STRL LF (GAUZE/BANDAGES/DRESSINGS) IMPLANT
BNDG COHESIVE 6X5 TAN ST LF (GAUZE/BANDAGES/DRESSINGS) IMPLANT
BNDG GAUZE DERMACEA FLUFF 4 (GAUZE/BANDAGES/DRESSINGS) ×2 IMPLANT
BNDG GZE DERMACEA 4 6PLY (GAUZE/BANDAGES/DRESSINGS)
CNTNR URN SCR LID CUP LEK RST (MISCELLANEOUS) IMPLANT
CONT SPEC 4OZ STRL OR WHT (MISCELLANEOUS) ×1
COVER SURGICAL LIGHT HANDLE (MISCELLANEOUS) ×2 IMPLANT
DRAPE DERMATAC (DRAPES) IMPLANT
DRAPE U-SHAPE 47X51 STRL (DRAPES) ×1 IMPLANT
DRESSING PEEL AND PLC PRVNA 13 (GAUZE/BANDAGES/DRESSINGS) IMPLANT
DRSG ADAPTIC 3X8 NADH LF (GAUZE/BANDAGES/DRESSINGS) ×1 IMPLANT
DRSG PEEL AND PLACE PREVENA 13 (GAUZE/BANDAGES/DRESSINGS) ×1
DURAPREP 26ML APPLICATOR (WOUND CARE) ×1 IMPLANT
ELECT REM PT RETURN 9FT ADLT (ELECTROSURGICAL) ×1
ELECTRODE REM PT RTRN 9FT ADLT (ELECTROSURGICAL) IMPLANT
GAUZE SPONGE 4X4 12PLY STRL (GAUZE/BANDAGES/DRESSINGS) ×1 IMPLANT
GLOVE BIOGEL PI IND STRL 9 (GLOVE) ×1 IMPLANT
GLOVE SURG ORTHO 9.0 STRL STRW (GLOVE) ×1 IMPLANT
GOWN STRL REUS W/ TWL XL LVL3 (GOWN DISPOSABLE) ×2 IMPLANT
GOWN STRL REUS W/TWL XL LVL3 (GOWN DISPOSABLE) ×2
GRAFT SKIN WND MICRO 38 (Tissue) IMPLANT
HANDPIECE INTERPULSE COAX TIP (DISPOSABLE)
KIT BASIN OR (CUSTOM PROCEDURE TRAY) ×1 IMPLANT
KIT PREVENA INCISION MGT20CM45 (CANNISTER) IMPLANT
KIT TURNOVER KIT B (KITS) ×1 IMPLANT
MANIFOLD NEPTUNE II (INSTRUMENTS) ×1 IMPLANT
NS IRRIG 1000ML POUR BTL (IV SOLUTION) ×1 IMPLANT
PACK ORTHO EXTREMITY (CUSTOM PROCEDURE TRAY) ×1 IMPLANT
PAD ARMBOARD 7.5X6 YLW CONV (MISCELLANEOUS) ×2 IMPLANT
SET HNDPC FAN SPRY TIP SCT (DISPOSABLE) IMPLANT
STOCKINETTE IMPERVIOUS 9X36 MD (GAUZE/BANDAGES/DRESSINGS) IMPLANT
SUT ETHILON 2 0 PSLX (SUTURE) ×1 IMPLANT
SWAB COLLECTION DEVICE MRSA (MISCELLANEOUS) ×1 IMPLANT
SWAB CULTURE ESWAB REG 1ML (MISCELLANEOUS) IMPLANT
TOWEL GREEN STERILE (TOWEL DISPOSABLE) ×1 IMPLANT
TUBE CONNECTING 12X1/4 (SUCTIONS) ×1 IMPLANT
YANKAUER SUCT BULB TIP NO VENT (SUCTIONS) ×1 IMPLANT

## 2023-03-04 NOTE — Progress Notes (Signed)
Pt is s/p a left achilles debridement with graft and vac application today. Pt called the triage line with complaints of bleeding through the coban over wrapping and into his post op shoe.   Pt came into the office and there was a very large blood clot on top of and underneath  the sponge. This had to be removed there was no way to clear all of the clot and leave the sponge intact.  Graft material remains in place and a new sponge was applied. Drape and new lily pad applied and over wrapped with coban. Good suction and seal. Another canister given to the pt as he did not get an extra one from the hospital as well as a new post op shoe as the one he had was so heavily soiled and could not clean away all of the blood.  An appt was made for Monday for post op follow up. Support provided and all questions answered. Will call with any concerns.   Shyra Emile, RMA, [employer]

## 2023-03-04 NOTE — Anesthesia Procedure Notes (Signed)
Anesthesia Regional Block: Adductor canal block   Pre-Anesthetic Checklist: , timeout performed,  Correct Patient, Correct Site, Correct Laterality,  Correct Procedure, Correct Position, site marked,  Risks and benefits discussed,  Surgical consent,  Pre-op evaluation,  At surgeon's request and post-op pain management  Laterality: Left  Prep: chloraprep       Needles:  Injection technique: Single-shot  Needle Type: Echogenic Needle     Needle Length: 9cm  Needle Gauge: 21     Additional Needles:   Narrative:  Start time: 03/04/2023 8:10 AM End time: 03/04/2023 8:16 AM Injection made incrementally with aspirations every 5 mL.  Performed by: Personally  Anesthesiologist: Achille Rich, MD  Additional Notes: Pt tolerated the procedure well.

## 2023-03-04 NOTE — Anesthesia Preprocedure Evaluation (Signed)
Anesthesia Evaluation  Patient identified by MRN, date of birth, ID band Patient awake    Reviewed: Allergy & Precautions, H&P , NPO status , Patient's Chart, lab work & pertinent test results  Airway Mallampati: II   Neck ROM: full    Dental   Pulmonary asthma , sleep apnea , COPD, former smoker   breath sounds clear to auscultation       Cardiovascular + angina  + CAD and + CABG   Rhythm:regular Rate:Normal     Neuro/Psych   Anxiety        GI/Hepatic   Endo/Other  diabetes, Type 2    Renal/GU Renal InsufficiencyRenal disease     Musculoskeletal  (+) Arthritis ,    Abdominal   Peds  Hematology   Anesthesia Other Findings   Reproductive/Obstetrics                             Anesthesia Physical Anesthesia Plan  ASA: 3  Anesthesia Plan: MAC and Regional   Post-op Pain Management:    Induction: Intravenous  PONV Risk Score and Plan: 1 and Propofol infusion and Treatment may vary due to age or medical condition  Airway Management Planned: Simple Face Mask  Additional Equipment:   Intra-op Plan:   Post-operative Plan:   Informed Consent: I have reviewed the patients History and Physical, chart, labs and discussed the procedure including the risks, benefits and alternatives for the proposed anesthesia with the patient or authorized representative who has indicated his/her understanding and acceptance.     Dental advisory given  Plan Discussed with: CRNA, Anesthesiologist and Surgeon  Anesthesia Plan Comments:        Anesthesia Quick Evaluation

## 2023-03-04 NOTE — Transfer of Care (Signed)
Immediate Anesthesia Transfer of Care Note  Patient: [name]  Procedure(s) Performed: LEFT ACHILLES DEBRIDEMENT (Left: Ankle)  Patient Location: PACU  Anesthesia Type:MAC and Regional  Level of Consciousness: awake, drowsy, and patient cooperative  Airway & Oxygen Therapy: Patient Spontanous Breathing and Patient connected to face mask oxygen  Post-op Assessment: Report given to RN and Patient moving all extremities X 4  Post vital signs: Reviewed and stable  Last Vitals:  Vitals Value Taken Time  BP    Temp    Pulse    Resp    SpO2      Last Pain:  Vitals:   03/04/23 0822  TempSrc:   PainSc: 0-No pain      Patients Stated Pain Goal: 0 (03/04/23 3329)  Complications: No notable events documented.

## 2023-03-04 NOTE — H&P (Signed)
[name] is an 57 y.o. male.   Chief Complaint: Left Achilles tendon ulcer. HPI: Patient has a history of chronic left Achilles tendon ulcer.  Patient has undergone prolonged conservative therapy including treatment at the wound center and hyperbaric treatment without resolution.  Past Medical History:  Diagnosis Date   Anginal pain (HCC)    Anxiety    Arthritis    RA IN HANDS   Asthma    CAD (coronary artery disease) 04/27/2014   Chronic renal disease, stage 3, moderately decreased glomerular filtration rate between 30-59 mL/min/1.73 square meter (HCC) 05/03/2014   COPD (chronic obstructive pulmonary disease) (HCC)    Coronary artery disease    Diabetes (HCC) 05/03/2014   Diabetes mellitus without complication (HCC)    type 1   Hyperlipidemia    Left shoulder pain    Peripheral vascular disease (HCC)    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/03/2014   Shortness of breath    Sleep apnea    mild OSA, no CPAP   Unstable angina pectoris (HCC) 04/25/2014    Past Surgical History:  Procedure Laterality Date   ABDOMINAL AORTOGRAM W/LOWER EXTREMITY N/A 01/15/2023   Procedure: ABDOMINAL AORTOGRAM W/LOWER EXTREMITY;  Surgeon: Cephus Shelling, MD;  Location: [facility] INVASIVE CV LAB;  Service: Cardiovascular;  Laterality: N/A;   CARDIAC CATHETERIZATION  04/25/2014   BY DR Jacinto Halim   CARDIAC CATHETERIZATION N/A 10/30/2015   Procedure: Left Heart Cath and Cors/Grafts Angiography;  Surgeon: Yates Decamp, MD;  [facility]VAS[facility] LAB;  Service: Cardiovascular;  Laterality: N/A;   CARPAL TUNNEL RELEASE     CATARACT EXTRACTION, BILATERAL     CORONARY ARTERY BYPASS GRAFT N/A 04/27/2014   Procedure: CORONARY ARTERY BYPASS GRAFTING on pump using left internal mammary artery to LAD coronary artery, right great saphenous vein graft to diagonal coronary artery with sequential to OM1 and circumflex coronary arteries. Right greater saphenous vein graft to posterior descending  coronary artery. ;  Surgeon: Delight Ovens, MD;  Location: [facility] OR;  Service: Open Heart Surgery;  Laterality: N   elbow drained Left 05/09/2019   ENDOVEIN HARVEST OF GREATER SAPHENOUS VEIN Right 04/27/2014   Procedure: ENDOVEIN HARVEST OF GREATER SAPHENOUS VEIN;  Surgeon: Delight Ovens, MD;  Location: [facility] OR;  Service: Open Heart Surgery;  Laterality: Right;   EXCISION ORAL TUMOR N/A 03/01/2018   Procedure: EXCISION ORAL TUMOR;  Surgeon: Christia Reading, MD;  Location: [facility];  Service: ENT;  Laterality: N/A;   EYE SURGERY     LASER   EYE SURGERY Bilateral    astigmatism correction    HARDWARE REMOVAL Left 10/03/2020   Procedure: LEFT FOOT REMOVAL HARDWARE, PLACE VANC POWDER;  Surgeon: Nadara Mustard, MD;  Location: [facility] OR;  Service: Orthopedics;  Laterality: Left;   INTRAOPERATIVE TRANSESOPHAGEAL ECHOCARDIOGRAM N/A 04/27/2014   Procedure: INTRAOPERATIVE TRANSESOPHAGEAL ECHOCARDIOGRAM;  Surgeon: Delight Ovens, MD;  Location: [facility] OR;  Service: Open Heart Surgery;  Laterality: N/A;   IR RADIOLOGIST EVAL & MGMT  12/22/2022   LEFT HEART CATHETERIZATION WITH CORONARY ANGIOGRAM N/A 04/25/2014   Procedure: LEFT HEART CATHETERIZATION WITH CORONARY ANGIOGRAM;  Surgeon: Pamella Pert, MD;  Location: [facility] CATH LAB;  Service: Cardiovascular;  Laterality: N/A;   MOUTH SURGERY  11/15/2017   tongue surgery    ORIF TOE FRACTURE Left 03/22/2020   Procedure: OPEN REDUCTION INTERNAL FIXATION (ORIF) Non Union 5th Metatarsal;  Surgeon: Kathryne Hitch, MD;  Location: [facility];  Service: Orthopedics;  Laterality: Left;    Family History  Problem Relation Age of Onset   Stomach cancer Mother    Cancer Mother    Prostate cancer Father    Heart disease Father    Cancer - Prostate Father    Heart disease Brother    Asthma Daughter    Healthy Daughter    Asthma Daughter    Social History:  reports that he has quit smoking. His smoking use included cigarettes. He  has a 8.5 pack-year smoking history. He has never used smokeless tobacco. He reports that he does not currently use alcohol. He reports that he does not use drugs.  Allergies:  Allergies  Allergen Reactions   Tramadol Shortness Of Breath   Metoprolol Diarrhea   Niacin And Related     night sweats    Medications Prior to Admission  Medication Sig Dispense Refill   amoxicillin (AMOXIL) 500 MG capsule Take 500 mg by mouth 2 (two) times daily.     Ascorbic Acid (VITAMIN C PO) Take 2,000 mg by mouth daily.     aspirin EC 81 MG tablet Take 1 tablet (81 mg total) by mouth daily. Swallow whole. 150 tablet 2   bisoprolol (ZEBETA) 5 MG tablet TAKE ONE-HALF TABLET BY MOUTH  DAILY 45 tablet 3   buPROPion (WELLBUTRIN SR) 150 MG 12 hr tablet TAKE 1 TABLET BY MOUTH DAILY 90 tablet 3   calcium carbonate (OSCAL) 1500 (600 Ca) MG TABS tablet Take 600 mg of elemental calcium by mouth daily.     cholecalciferol (VITAMIN D3) 25 MCG (1000 UNIT) tablet Take 1,000 Units by mouth daily.     cilostazol (PLETAL) 50 MG tablet Take 50 mg by mouth daily.     COD LIVER OIL PO Take 1 capsule by mouth daily.     gabapentin (NEURONTIN) 300 MG capsule Take 1 capsule (300 mg total) by mouth at bedtime. 30 capsule 2   HUMALOG 100 UNIT/ML injection Inject 15-20 Units into the skin 3 (three) times daily with meals. Sliding scale  Depending on carb intake     insulin glargine (LANTUS) 100 UNIT/ML injection Inject 17 Units into the skin 2 (two) times daily.     losartan (COZAAR) 25 MG tablet Take 25 mg by mouth daily.     Magnesium 500 MG CAPS Take 500 mg by mouth 2 (two) times daily.     nitroGLYCERIN (NITROSTAT) 0.4 MG SL tablet DISSOLVE 1 TABLET UNDER THE TONGUE EVERY 5 MINUTES AS  NEEDED FOR CHEST PAIN. MAX  OF 3 TABLETS IN 15 MINUTES. CALL 911 IF PAIN PERSISTS. (Patient taking differently: Place 0.4 mg under the tongue every 5 (five) minutes as needed for chest pain.) 100 tablet 3   Omega-3 Fatty Acids (FISH OIL PO) Take  5,000 mg by mouth daily.     OVER THE COUNTER MEDICATION Take 1 capsule by mouth 2 (two) times daily. Nitric oxide blood flow supplement     pentoxifylline (TRENTAL) 400 MG CR tablet Take 1 tablet (400 mg total) by mouth 3 (three) times daily with meals. 90 tablet 3   Probiotic Product (ALIGN) 4 MG CAPS Take 4 mg by mouth daily.     ranolazine (RANEXA) 500 MG 12 hr tablet Take 250 mg by mouth at bedtime.     SANTYL 250 UNIT/GM ointment Apply 1 Application topically 2 (two) times daily.     simvastatin (ZOCOR) 20 MG tablet TAKE 1 TABLET BY MOUTH AT BEDTIME 90 tablet 1  TURMERIC CURCUMIN PO Take 1,500 mg by mouth 2 (two) times daily.     varenicline (CHANTIX) 0.5 MG tablet Take 0.5 mg by mouth daily.     vitamin B-12 (CYANOCOBALAMIN) 500 MCG tablet Take 500 mcg by mouth daily.     vitamin E 180 MG (400 UNITS) capsule Take 400 Units by mouth daily.     Blood Glucose Monitoring Suppl (ONETOUCH VERIO FLEX SYSTEM) w/Device KIT USE TO CHECK BLOOD GLUCOSE UP TO 10 TIMES PER DAY     Continuous Blood Gluc Sensor (FREESTYLE LIBRE 14 DAY SENSOR) MISC CHANGE EVERY 14 DAYS TO MONITOR BLOOD GLUCOSE      Results for orders placed or performed in visit on 03/03/23 (from the past 48 hour(s))  Glucose, capillary     Status: Abnormal   Collection Time: 03/03/23  7:40 AM  Result Value Ref Range   Glucose-Capillary 203 (H) 70 - 99 mg/dL    Comment: Glucose reference range applies only to samples taken after fasting for at least 8 hours.  Glucose, capillary     Status: Abnormal   Collection Time: 03/03/23 10:07 AM  Result Value Ref Range   Glucose-Capillary 201 (H) 70 - 99 mg/dL    Comment: Glucose reference range applies only to samples taken after fasting for at least 8 hours.   No results found.  Review of Systems  All other systems reviewed and are negative.   There were no vitals taken for this visit. Physical Exam   Assessment/Plan Assessment: Chronic left Achilles tendon ulcer.  Plan: Will  plan for surgical debridement and application of biologic tissue graft.  Risk and benefits were discussed including infection neurovascular injury need for additional surgery.  Patient states he understands wished to proceed at this time.  Nadara Mustard, MD 03/04/2023, 6:32 AM

## 2023-03-04 NOTE — Op Note (Signed)
03/04/2023  9:38 AM  PATIENT:  [name]    PRE-OPERATIVE DIAGNOSIS:  Ulcer Left Achilles  POST-OPERATIVE DIAGNOSIS:  Same  PROCEDURE:  LEFT ACHILLES EXCISIONAL DEBRIDEMENT, with excision of skin and soft tissue muscle and tendon.  Tissue sent for cultures.  Application Kerecis micro graft 38 cm. Application Prevena 13 cm wound VAC.  SURGEON:  Nadara Mustard, MD  PHYSICIAN ASSISTANT:None ANESTHESIA:   General  PREOPERATIVE INDICATIONS:  [name] is a  57 y.o. male with a diagnosis of Ulcer Left Achilles who failed conservative measures and elected for surgical management.    The risks benefits and alternatives were discussed with the patient preoperatively including but not limited to the risks of infection, bleeding, nerve injury, cardiopulmonary complications, the need for revision surgery, among others, and the patient was willing to proceed.  OPERATIVE IMPLANTS:   Implant Name Type Inv. Item Serial No. Manufacturer Lot No. LRB No. Used Action  GRAFT SKIN WND MICRO 38 - [id number] Tissue GRAFT SKIN WND MICRO 38  [employer] [id number] Left 1 Implanted    @ENCIMAGES @  OPERATIVE FINDINGS: Nonviable Achilles and soft tissue sent for cultures.  Tissue margins were clear no deep abscess no tunneling wound.  OPERATIVE PROCEDURE: Patient was brought the operating room and underwent a MAC anesthetic.  After adequate levels anesthesia were obtained patient's left lower extremity was prepped using DuraPrep draped into a sterile field a timeout was called.  A 21 blade knife was used to excise skin and soft tissue muscle and tendon over the Achilles.  This was excised back to healthy viable tissue.  This left a wound that was 3 x 5 cm.  Wound was irrigated with normal saline the Kerecis micro graft 38 cm was applied to the 13 cm Prevena wound VAC this was applied to the wound secured with derma tack and Coban this had a good suction fit patient was taken the PACU in stable  condition.   DISCHARGE PLANNING:  Antibiotic duration: Preoperative antibiotics will add further antibiotics pending culture sensitivities.  Weightbearing: Weightbearing as tolerated  Pain medication: Prescription for Vicodin  Dressing care/ Wound VAC: Discharged with wound VAC   Discharge to: Discharge to home.  Follow-up: In the office 1 week post operative.

## 2023-03-04 NOTE — Anesthesia Procedure Notes (Signed)
Anesthesia Regional Block: Popliteal block   Pre-Anesthetic Checklist: , timeout performed,  Correct Patient, Correct Site, Correct Laterality,  Correct Procedure, Correct Position, site marked,  Risks and benefits discussed,  Surgical consent,  Pre-op evaluation,  At surgeon's request and post-op pain management  Laterality: Left  Prep: chloraprep       Needles:  Injection technique: Single-shot  Needle Type: Echogenic Stimulator Needle          Additional Needles:   Procedures:, nerve stimulator,,,,,     Nerve Stimulator or Paresthesia:  Response: plantar flexion of foot, 0.45 mA  Additional Responses:   Narrative:  Start time: 03/04/2023 8:02 AM End time: 03/04/2023 8:10 AM Injection made incrementally with aspirations every 5 mL.  Performed by: Personally  Anesthesiologist: Achille Rich, MD  Additional Notes: Functioning IV was confirmed and monitors were applied.  A 90mm 21ga Arrow echogenic stimulator needle was used. Sterile prep and drape,hand hygiene and sterile gloves were used.  Negative aspiration and negative test dose prior to incremental administration of local anesthetic. The patient tolerated the procedure well.  Ultrasound guidance: relevent anatomy identified, needle position confirmed, local anesthetic spread visualized around nerve(s), vascular puncture avoided.  Image printed for medical record.

## 2023-03-05 ENCOUNTER — Encounter (HOSPITAL_COMMUNITY): Payer: Self-pay | Admitting: Orthopedic Surgery

## 2023-03-05 ENCOUNTER — Encounter (HOSPITAL_BASED_OUTPATIENT_CLINIC_OR_DEPARTMENT_OTHER): Payer: 59 | Admitting: Internal Medicine

## 2023-03-05 ENCOUNTER — Ambulatory Visit: Payer: 59

## 2023-03-05 NOTE — Anesthesia Postprocedure Evaluation (Signed)
Anesthesia Post Note  Patient: [name]  Procedure(s) Performed: LEFT ACHILLES DEBRIDEMENT (Left: Ankle)     Patient location during evaluation: PACU Anesthesia Type: Regional and MAC Level of consciousness: awake and alert Pain management: pain level controlled Vital Signs Assessment: post-procedure vital signs reviewed and stable Respiratory status: spontaneous breathing, nonlabored ventilation, respiratory function stable and patient connected to nasal cannula oxygen Cardiovascular status: stable and blood pressure returned to baseline Postop Assessment: no apparent nausea or vomiting Anesthetic complications: no   No notable events documented.  Last Vitals:  Vitals:   03/04/23 0945 03/04/23 1000  BP: (!) 151/68 135/73  Pulse: (!) 58 (!) 55  Resp: 11 13  Temp:  36.8 C  SpO2: 100% 98%    Last Pain:  Vitals:   03/04/23 1000  TempSrc:   PainSc: 0-No pain                 Binh Doten S

## 2023-03-06 ENCOUNTER — Other Ambulatory Visit: Payer: Self-pay

## 2023-03-06 ENCOUNTER — Encounter (HOSPITAL_BASED_OUTPATIENT_CLINIC_OR_DEPARTMENT_OTHER): Payer: 59 | Attending: Internal Medicine | Admitting: Internal Medicine

## 2023-03-06 ENCOUNTER — Telehealth: Payer: Self-pay

## 2023-03-06 DIAGNOSIS — E10621 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: Secondary | ICD-10-CM | POA: Diagnosis not present

## 2023-03-06 DIAGNOSIS — J449 Chronic obstructive pulmonary disease, unspecified: Secondary | ICD-10-CM | POA: Diagnosis not present

## 2023-03-06 DIAGNOSIS — X58XXXA Exposure to other specified factors, initial encounter: Secondary | ICD-10-CM | POA: Diagnosis not present

## 2023-03-06 DIAGNOSIS — I70245 Atherosclerosis of native arteries of left leg with ulceration of other part of foot: Secondary | ICD-10-CM | POA: Diagnosis not present

## 2023-03-06 DIAGNOSIS — L97528 Non-pressure chronic ulcer of other part of left foot with other specified severity: Secondary | ICD-10-CM

## 2023-03-06 DIAGNOSIS — M069 Rheumatoid arthritis, unspecified: Secondary | ICD-10-CM | POA: Insufficient documentation

## 2023-03-06 DIAGNOSIS — Z87891 Personal history of nicotine dependence: Secondary | ICD-10-CM | POA: Insufficient documentation

## 2023-03-06 DIAGNOSIS — S91302A Unspecified open wound, left foot, initial encounter: Secondary | ICD-10-CM

## 2023-03-06 LAB — GLUCOSE, CAPILLARY
Glucose-Capillary: 163 mg/dL — ABNORMAL HIGH (ref 70–99)
Glucose-Capillary: 221 mg/dL — ABNORMAL HIGH (ref 70–99)

## 2023-03-06 MED ORDER — SULFAMETHOXAZOLE-TRIMETHOPRIM 800-160 MG PO TABS: 1.0000 | ORAL_TABLET | Freq: Two times a day (BID) | ORAL | 0 refills | Status: DC

## 2023-03-06 NOTE — Progress Notes (Addendum)
[name], [name] ([id number]) [id number] Page 1 of 2 Visit Report for 03/06/2023 Arrival Information Details Patient Name: Date of Service: [name], [name] 03/06/2023 7:30 A M Medical Record Number: [id number] Patient Account Number: [id number] Date of Birth/Sex: Treating RN: 08/23/1965 (57 y.o. [name] Primary Care : [name] Other Clinician: [name] Referring : Treating /Extender: Donnetta Hail in Treatment: 10 Visit Information History Since Last Visit All ordered tests and consults were completed: Yes Patient Arrived: Ambulatory Added or deleted any medications: No Arrival Time: 07:33 Any new allergies or adverse reactions: No Accompanied By: significant other Had a fall or experienced change in No Transfer Assistance: None activities of daily living that may affect Patient Identification Verified: Yes risk of falls: Secondary Verification Process Completed: Yes Signs or symptoms of abuse/neglect since last visito No Patient Requires Transmission-Based Precautions: No Hospitalized since last visit: No Patient Has Alerts: No Implantable device outside of the clinic excluding No cellular tissue based products placed in the center since last visit: Pain Present Now: No Electronic Signature(s) Signed: 03/06/2023 9:34:43 AM By: [name] CHT EMT BS , , Entered By: [name] on 03/06/2023 09:34:43 -------------------------------------------------------------------------------- Encounter Discharge Information Details Patient Name: Date of Service: [name], [name] [name] 03/06/2023 7:30 A M Medical Record Number: [id number] Patient Account Number: [id number] Date of Birth/Sex: Treating RN: Sep 07, 1965 (57 y.o. [name] Primary Care : [name] Other Clinician: [name] Referring : Treating /Extender: Donnetta Hail in Treatment: 10 Encounter Discharge Information Items Discharge Condition: Stable Ambulatory Status: Wheelchair Discharge Destination: Home Transportation: Private Auto Accompanied By: significant other Schedule Follow-up Appointment: No Clinical Summary of Care: Electronic Signature(s) Signed: 03/06/2023 11:50:54 AM By: [name] CHT EMT BS , , Entered By: [name] on 03/06/2023 11:50:54 [name] ([id number]) [id number] Page 2 of 2 -------------------------------------------------------------------------------- Vitals Details Patient Name: Date of Service: [name], [name] 03/06/2023 7:30 A M Medical Record Number: [id number] Patient Account Number: [id number] Date of Birth/Sex: Treating RN: February 20, 1966 (57 y.o. [name] Primary Care : [name] Other Clinician: [name] Referring : Treating /Extender: Donnetta Hail in Treatment: 10 Vital Signs Time Taken: 07:50 Temperature (F): 97.4 Height (in): 68 Pulse (bpm): 71 Weight (lbs): 200 Respiratory Rate (breaths/min): 16 Body Mass Index (BMI): 30.4 Blood Pressure (mmHg): 106/52 Capillary Blood Glucose (mg/dl): 202 Reference Range: 80 - 120 mg / dl Electronic Signature(s) Signed: 03/06/2023 9:35:08 AM By: [name] CHT EMT BS , , Entered By: [name] on 03/06/2023 09:35:08

## 2023-03-06 NOTE — Progress Notes (Addendum)
ESTUS, KRAKOWSKI ([id number]) [id number] Page 1 of 2 Visit Report for 03/06/2023 HBO Details Patient Name: Date of Service: [place], [name] 03/06/2023 7:30 A M Medical Record Number: [id number] Patient Account Number: [id number] Date of Birth/Sex: Treating RN: 1965/12/27 (57 y.o. [name] Primary Care : [name] Other Clinician: [name] Referring : Treating /Extender: Donnetta Hail in Treatment: 10 HBO Treatment Course Details Treatment Course Number: 1 Ordering : Geralyn Corwin T Treatments Ordered: otal 40 HBO Treatment Start Date: 02/16/2023 HBO Indication: Other (specify in Notes) Notes: Acute peripheral arterial insufficiency HBO Treatment Details Treatment Number: 12 Patient Type: Outpatient Chamber Type: Monoplace Chamber Serial #: [id number] Treatment Protocol: 2.0 ATA with 90 minutes oxygen, with two 5 minute air breaks Treatment Details Compression Rate Down: 1.0 psi / minute De-Compression Rate Up: 2.0 psi / minute A breaks and breathing ir Compress Tx Pressure periods Decompress Decompress Begins Reached (leave unused spaces Begins Ends blank) Chamber Pressure (ATA 1 2 2 2 2 2  --2 1 ) Clock Time (24 hr) 08:08 08:22 08:52 08:57 09:27 09:32 - - 10:02 10:12 Treatment Length: 124 (minutes) Treatment Segments: 4 Vital Signs Capillary Blood Glucose Reference Range: 80 - 120 mg / dl HBO Diabetic Blood Glucose Intervention Range: <131 mg/dl or >756 mg/dl Type: Time Vitals Blood Respiratory Capillary Blood Glucose Pulse Action Pulse: Temperature: Taken: Pressure: Rate: Glucose (mg/dl): Meter #: Oximetry (%) Taken: Pre 07:50 106/52 71 16 97.4 163 none per protocol Post 10:16 141/65 71 18 97.3 221 none per protocol Treatment Response Treatment Toleration: Well Treatment Completion Status: Treatment Completed without Adverse Event Treatment Notes Mr.  Bose arrived with normal vital signs. He prepared for treatment. After performing a safety check, he was placed in the chamber which was compressed at a rate of 1.5 psi/min after confirming normal ear equalization. He denied any issues with ear equalization and/or pain associated with barotrauma. Physician HBO Attestation: I certify that I supervised this HBO treatment in accordance with Medicare guidelines. A trained emergency response team is readily available per Yes hospital policies and procedures. Continue HBOT as ordered. Yes Electronic Signature(s) Signed: 03/09/2023 4:29:36 PM By: Geralyn Corwin DO Previous Signature: 03/06/2023 11:46:17 AM Version By: [name] CHT EMT BS , , Previous Signature: 03/06/2023 9:38:25 AM Version By: [name] CHT EMT BS , , Previous Signature: 03/06/2023 11:32:50 AM Version By: Geralyn Corwin DO Entered By: Geralyn Corwin on 03/09/2023 15:26:27 [name] ([id number]) [id number] Page 2 of 2 -------------------------------------------------------------------------------- HBO Safety Checklist Details Patient Name: Date of Service: [place], [name] 03/06/2023 7:30 A M Medical Record Number: [id number] Patient Account Number: [id number] Date of Birth/Sex: Treating RN: Oct 01, 1965 (57 y.o. [name] Primary Care : [name] Other Clinician: [name] Referring : Treating /Extender: Donnetta Hail in Treatment: 10 HBO Safety Checklist Items Safety Checklist Consent Form Signed Patient voided / foley secured and emptied When did you last eato 0700 Last dose of injectable or oral agent Last PM Ostomy pouch emptied and vented if applicable NA All implantable devices assessed, documented and approved Freestyle Libre 2 and3 on approved list Intravenous access site secured and place NA Valuables secured Linens and cotton and  cotton/polyester blend (less than 51% polyester) Personal oil-based products / skin lotions / body lotions removed Wigs or hairpieces removed NA Smoking or tobacco materials removed NA Books / newspapers / magazines / loose paper removed Cologne, aftershave, perfume and deodorant removed Jewelry removed (may wrap wedding  band) Make-up removed Hair care products removed Battery operated devices (external) removed Heating patches and chemical warmers removed Titanium eyewear removed Nail polish cured greater than 10 hours NA Casting material cured greater than 10 hours NA Hearing aids removed NA Loose dentures or partials removed NA Prosthetics have been removed NA Patient demonstrates correct use of air break device (if applicable) Patient concerns have been addressed Patient grounding bracelet on and cord attached to chamber Specifics for Inpatients (complete in addition to above) Medication sheet sent with patient NA Intravenous medications needed or due during therapy sent with patient NA Drainage tubes (e.g. nasogastric tube or chest tube secured and vented) NA Endotracheal or Tracheotomy tube secured NA Cuff deflated of air and inflated with saline NA Airway suctioned NA Notes Paper version used prior to treatment start. Electronic Signature(s) Signed: 03/06/2023 9:36:05 AM By: [name] CHT EMT BS , , Entered By: [name] on 03/06/2023 09:36:04

## 2023-03-06 NOTE — Telephone Encounter (Signed)
-----   Message from Nadara Mustard sent at 03/06/2023  3:30 PM EDT ----- Call patient.  Cultures are showing a bacteria.  We will start him on Bactrim DS 1 p.o. twice daily for 10 days.  If you could call him let him know we are starting these antibiotics. ----- Message ----- From: Leory Plowman, Lab In [place] Sent: 03/04/2023   3:53 PM EDT To: Nadara Mustard, MD

## 2023-03-06 NOTE — Telephone Encounter (Signed)
I called pt and advised per Dr. [name] that the  surgery cultures sensitivities are pending but he did grow bacteria and Dr. [name] spoke with pharmacy and they suggested starting Bactrim DC bid x 10 day. This was sent to pharm and pt is aware.

## 2023-03-09 ENCOUNTER — Ambulatory Visit (INDEPENDENT_AMBULATORY_CARE_PROVIDER_SITE_OTHER): Payer: 59 | Admitting: Orthopedic Surgery

## 2023-03-09 ENCOUNTER — Encounter (HOSPITAL_BASED_OUTPATIENT_CLINIC_OR_DEPARTMENT_OTHER): Payer: 59 | Admitting: Internal Medicine

## 2023-03-09 ENCOUNTER — Encounter: Payer: Self-pay | Admitting: Orthopedic Surgery

## 2023-03-09 DIAGNOSIS — L97423 Non-pressure chronic ulcer of left heel and midfoot with necrosis of muscle: Secondary | ICD-10-CM

## 2023-03-09 NOTE — Progress Notes (Signed)
Office Visit Note   Patient: [name]           Date of Birth: August 25, 1965           MRN: [id number] Visit Date: 03/09/2023              Requested by: Garlan Fillers, MD [address] [place],  [place] [address] PCP: Garlan Fillers, MD  Chief Complaint  Patient presents with   Left Heel - Routine Post Op      HPI: Patient is a 57 year old gentleman who is status post left Achilles debridement and application of Kerecis tissue graft and a wound VAC this past Friday.  Assessment & Plan: Visit Diagnoses:  1. 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   Ortho Exam  Patient is alert, oriented, no adenopathy, well-dressed, normal affect, normal respiratory effort. Examination the wound bed has healthy granulation tissue to the wound is flat 1 cm in diameter.  There is no cellulitis odor or drainage.  Patient did show up in the office on Friday after surgery with a blood clot under the wound VAC dressing.  The dressing was changed.  Imaging: No results found.   Labs: Lab Results  Component Value Date   HGBA1C 7.4 (H) 04/25/2014   HGBA1C 7.4 (H) 04/25/2014   ESRSEDRATE 9 09/26/2019   ESRSEDRATE 12 09/09/2017   ESRSEDRATE 58 (H) 06/01/2014   REPTSTATUS PENDING 03/04/2023   GRAMSTAIN  03/04/2023    NO WBC SEEN NO ORGANISMS SEEN Performed at [facility], [address] [place], [place] [address]    CULT  03/04/2023    RARE STENOTROPHOMONAS MALTOPHILIA NO ANAEROBES ISOLATED; CULTURE IN PROGRESS FOR 5 DAYS    LABORGA STENOTROPHOMONAS MALTOPHILIA 03/04/2023     Lab Results  Component Value Date   ALBUMIN 3.7 03/15/2018   ALBUMIN 3.7 09/09/2017   ALBUMIN 4.2 03/30/2017    Lab Results  Component Value Date   MG 2.3 04/28/2014   MG 2.4 04/28/2014   MG 2.7 (H)  04/27/2014   Lab Results  Component Value Date   VD25OH 57 06/22/2019    No results found for: "PREALBUMIN"    Latest Ref Rng & Units 03/04/2023    7:00 AM 01/15/2023    1:35 PM 04/01/2021    3:37 PM  CBC EXTENDED  WBC 4.0 - 10.5 K/uL 11.4     RBC 4.22 - 5.81 MIL/uL 3.74     Hemoglobin 13.0 - 17.0 g/dL 72.5  36.6  44.0   HCT 39.0 - 52.0 % 35.6  38.0  33.9   Platelets 150 - 400 K/uL 267        There is no height or weight on file to calculate BMI.  Orders:  No orders of the defined types were placed in this encounter.  No orders of the defined types were placed in this encounter.    Procedures: No procedures performed  Clinical Data: No additional findings.  ROS:  All other systems negative, except as noted in the HPI. Review of Systems  Objective: Vital Signs: There were no vitals taken for this visit.  Specialty Comments:  No specialty comments available.  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 03/22/2020   Osteopenia of multiple sites 02/28/2020   PVD (peripheral vascular disease) (HCC) 10/11/2019   Chronic venous insufficiency 10/05/2018   Diabetic cataract (HCC) 05/21/2018   Controlled type 1 diabetes mellitus with diabetic peripheral angiopathy without gangrene (HCC) 08/14/2017   Dyslipidemia 03/26/2017   High risk medication use 09/23/2016   History of hypertension 09/23/2016   History of chronic kidney disease 09/23/2016   History of coronary artery disease 09/23/2016   Tobacco abuse 09/23/2016   Primary osteoarthritis of both knees 09/23/2016   History of diabetes mellitus 09/23/2016   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  mellitus (HCC) 11/20/2015   Chest pain with high risk for cardiac etiology 10/29/2015   Asymptomatic bilateral carotid artery stenosis 06/08/2014   Pleural effusion, bilateral 06/03/2014   Dyspnea 06/01/2014   Diabetes (HCC) 05/03/2014   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/03/2014   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   Chronic renal disease, stage 3, moderately decreased glomerular filtration rate between 30-59 mL/min/1.73 square meter (HCC) 05/03/2014   CAD (coronary artery disease) 04/27/2014   Acne 12/19/2013   On isotretinoin therapy 12/19/2013   Nuclear sclerotic cataract, bilateral 12/19/2011   Vitreous hemorrhage (HCC) 06/16/2011   Past Medical History:  Diagnosis Date   Anginal pain (HCC)    Anxiety    Arthritis    RA IN HANDS   Asthma    CAD (coronary artery disease) 04/27/2014   Chronic renal disease, stage 3, moderately decreased glomerular filtration rate between 30-59 mL/min/1.73 square meter (HCC) 05/03/2014   COPD (chronic obstructive pulmonary disease) (HCC)    Coronary artery disease    Diabetes (HCC) 05/03/2014   Diabetes mellitus without complication (HCC)    type 1   Hyperlipidemia    Left shoulder pain    Peripheral vascular disease (HCC)    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/03/2014   Shortness of breath    Sleep apnea    mild OSA, no CPAP   Unstable angina pectoris (HCC) 04/25/2014    Family History  Problem Relation Age of Onset   Stomach cancer Mother    Cancer Mother    Prostate cancer Father    Heart disease Father    Cancer - Prostate Father    Heart disease Brother    Asthma Daughter    Healthy Daughter    Asthma Daughter     Past Surgical History:  Procedure Laterality Date   ABDOMINAL AORTOGRAM W/LOWER EXTREMITY N/A 01/15/2023   Procedure: ABDOMINAL AORTOGRAM W/LOWER EXTREMITY;  Surgeon: Cephus Shelling, MD;  Location: [facility] INVASIVE CV LAB;  Service: Cardiovascular;  Laterality: N/A;   CARDIAC  CATHETERIZATION  04/25/2014   BY DR Jacinto Halim   CARDIAC CATHETERIZATION N/A 10/30/2015   Procedure: Left Heart Cath and Cors/Grafts Angiography;  Surgeon: Yates Decamp, MD;  Location[facility] INVASIVE CV LAB;  Service: Cardiovascular;  Laterality: N/A;   CARPAL TUNNEL RELEASE     CATARACT EXTRACTION, BILATERAL     CORONARY ARTERY BYPASS GRAFT N/A 04/27/2014   Procedure: CORONARY ARTERY BYPASS GRAFTING on pump using left internal mammary artery to LAD coronary artery, right great saphenous vein graft to diagonal coronary artery with sequential to OM1 and circumflex coronary arteries. Right greater saphenous vein graft to posterior descending coronary artery. ;  Surgeon: Delight Ovens, MD;  Location: [facility] OR;  Service: Open Heart Surgery;  Laterality: N   elbow drained Left 05/09/2019   ENDOVEIN HARVEST OF GREATER SAPHENOUS VEIN Right 04/27/2014   Procedure: ENDOVEIN HARVEST OF GREATER SAPHENOUS VEIN;  Surgeon: Delight Ovens, MD;  Location: [facility] OR;  Service: Open Heart Surgery;  Laterality: Right;   EXCISION ORAL TUMOR N/A 03/01/2018   Procedure: EXCISION ORAL TUMOR;  Surgeon: Christia Reading, MD;  Location: [facility];  Service: ENT;  Laterality: N/A;   EYE SURGERY     LASER   EYE SURGERY Bilateral    astigmatism correction    HARDWARE REMOVAL Left 10/03/2020   Procedure: LEFT FOOT REMOVAL HARDWARE, PLACE VANC POWDER;  Surgeon: Nadara Mustard, MD;  Location: [facility] OR;  Service: Orthopedics;  Laterality: Left;   I & D EXTREMITY Left 03/04/2023   Procedure: LEFT ACHILLES DEBRIDEMENT;  Surgeon: Nadara Mustard, MD;  Location: [facility] OR;  Service: Orthopedics;  Laterality: Left;   INTRAOPERATIVE TRANSESOPHAGEAL ECHOCARDIOGRAM N/A 04/27/2014   Procedure: INTRAOPERATIVE TRANSESOPHAGEAL ECHOCARDIOGRAM;  Surgeon: Delight Ovens, MD;  Location: [facility] OR;  Service: Open Heart Surgery;  Laterality: N/A;   IR RADIOLOGIST EVAL & MGMT  12/22/2022   LEFT HEART CATHETERIZATION WITH CORONARY ANGIOGRAM N/A 04/25/2014    Procedure: LEFT HEART CATHETERIZATION WITH CORONARY ANGIOGRAM;  Surgeon: Pamella Pert, MD;  Location: [facility] CATH LAB;  Service: Cardiovascular;  Laterality: N/A;   MOUTH SURGERY  11/15/2017   tongue surgery    ORIF TOE FRACTURE Left 03/22/2020   Procedure: OPEN REDUCTION INTERNAL FIXATION (ORIF) Non Union 5th Metatarsal;  Surgeon: Kathryne Hitch, MD;  Location: [facility];  Service: Orthopedics;  Laterality: Left;   Social History   Occupational History   Not on file  Tobacco Use   Smoking status: Former    Current packs/day: 0.25    Average packs/day: 0.3 packs/day for 34.0 years (8.5 ttl pk-yrs)    Types: Cigarettes   Smokeless tobacco: Never  Vaping Use   Vaping status: Never Used  Substance and Sexual Activity   Alcohol use: Not Currently    Comment: RARE   Drug use: No   Sexual activity: Not on file

## 2023-03-09 NOTE — Progress Notes (Signed)
[name], [name] ([id number]) [id number] Page 1 of 1 Visit Report for 03/06/2023 SuperBill Details Patient Name: Date of Service: [place], [name] 03/06/2023 Medical Record Number: [id number] Patient Account Number: [id number] Date of Birth/Sex: Treating RN: 1965-11-13 (58 y.o. 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 J44.9 Chronic obstructive pulmonary disease, unspecified M06.9 Rheumatoid arthritis, unspecified Z87.891 Personal history of nicotine dependence Facility Procedures CPT4 Code Description Modifier Quantity [id number] G0277-(Facility Use Only) HBOT full body chamber, , 4 ICD-10 Diagnosis Description 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 S91.302A Unspecified open wound, left foot, initial encounter 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 Physician Procedures Quantity CPT4 Code Description Modifier [id number] - WC PHYS HYPERBARIC OXYGEN THERAPY 1 ICD-10 Diagnosis Description 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

## 2023-03-10 ENCOUNTER — Telehealth: Payer: Self-pay | Admitting: Orthopedic Surgery

## 2023-03-10 ENCOUNTER — Encounter (HOSPITAL_BASED_OUTPATIENT_CLINIC_OR_DEPARTMENT_OTHER): Payer: 59 | Admitting: Internal Medicine

## 2023-03-10 DIAGNOSIS — L97528 Non-pressure chronic ulcer of other part of left foot with other specified severity: Secondary | ICD-10-CM

## 2023-03-10 DIAGNOSIS — I70245 Atherosclerosis of native arteries of left leg with ulceration of other part of foot: Secondary | ICD-10-CM

## 2023-03-10 DIAGNOSIS — S91302A Unspecified open wound, left foot, initial encounter: Secondary | ICD-10-CM

## 2023-03-10 DIAGNOSIS — E10621 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: Secondary | ICD-10-CM

## 2023-03-10 LAB — GLUCOSE, CAPILLARY
Glucose-Capillary: 163 mg/dL — ABNORMAL HIGH (ref 70–99)
Glucose-Capillary: 117 mg/dL — ABNORMAL HIGH (ref 70–99)

## 2023-03-10 NOTE — Progress Notes (Addendum)
ELRA, BOLAN ([id number]) [id number] Page 1 of 2 Visit Report for 03/10/2023 HBO Details Patient Name: Date of Service: [place], [name] 03/10/2023 7:30 A M Medical Record Number: [id number] Patient Account Number: [id number] Date of Birth/Sex: Treating RN: 04/07/1966 (58 y.o. [name] Primary Care : [name] Other Clinician: [name] Referring : Treating /Extender: Donnetta Hail in Treatment: 10 HBO Treatment Course Details Treatment Course Number: 1 Ordering : Geralyn Corwin T Treatments Ordered: otal 40 HBO Treatment Start Date: 02/16/2023 HBO Indication: Other (specify in Notes) Notes: Acute peripheral arterial insufficiency HBO Treatment Details Treatment Number: 13 Patient Type: Outpatient Chamber Type: Monoplace Chamber Serial #: [id number] Treatment Protocol: 2.0 ATA with 90 minutes oxygen, with two 5 minute air breaks Treatment Details Compression Rate Down: 1.5 psi / minute De-Compression Rate Up: 1.5 psi / minute A breaks and breathing ir Compress Tx Pressure periods Decompress Decompress Begins Reached (leave unused spaces Begins Ends blank) Chamber Pressure (ATA 1 2 2 2 2 2  --2 1 ) Clock Time (24 hr) 08:14 08:26 08:56 09:01 09:31 09:36 - - 10:06 10:16 Treatment Length: 122 (minutes) Treatment Segments: 4 Vital Signs Capillary Blood Glucose Reference Range: 80 - 120 mg / dl HBO Diabetic Blood Glucose Intervention Range: <131 mg/dl or >295 mg/dl Type: Time Vitals Blood Respiratory Capillary Blood Glucose Pulse Action Pulse: Temperature: Taken: Pressure: Rate: Glucose (mg/dl): Meter #: Oximetry (%) Taken: Pre 07:45 134/65 69 18 97.9 163 none per protocol Post 10:19 132/62 66 18 97.9 117 none per protocol Treatment Response Treatment Toleration: Well Treatment Completion Status: Treatment Completed without Adverse Event Physician HBO  Attestation: I certify that I supervised this HBO treatment in accordance with Medicare guidelines. A trained emergency response team is readily available per Yes hospital policies and procedures. Continue HBOT as ordered. Yes Electronic Signature(s) Signed: 03/10/2023 5:13:05 PM By: Geralyn Corwin DO Previous Signature: 03/10/2023 11:09:26 AM Version By: [name] CHT EMT BS , , Entered By: Geralyn Corwin on 03/10/2023 16:40:25 Vitullo, [name] ([id number]) [id number] Page 2 of 2 -------------------------------------------------------------------------------- HBO Safety Checklist Details Patient Name: Date of Service: [place], [name] 03/10/2023 7:30 A M Medical Record Number: [id number] Patient Account Number: [id number] Date of Birth/Sex: Treating RN: 1966/02/19 (57 y.o. [name] Primary Care : [name] Other Clinician: [name] Referring : Treating /Extender: Donnetta Hail in Treatment: 10 HBO Safety Checklist Items Safety Checklist Consent Form Signed Patient voided / foley secured and emptied When did you last eato 0645 BEC Biscuit, Coffee Last dose of injectable or oral agent 0630 - 17 Units Lantus Ostomy pouch emptied and vented if applicable NA All implantable devices assessed, documented and approved NA Intravenous access site secured and place NA Valuables secured Linens and cotton and cotton/polyester blend (less than 51% polyester) Personal oil-based products / skin lotions / body lotions removed Wigs or hairpieces removed NA Smoking or tobacco materials removed NA Books / newspapers / magazines / loose paper removed Cologne, aftershave, perfume and deodorant removed Jewelry removed (may wrap wedding band) Make-up removed NA Hair care products removed Battery operated devices (external) removed Heating patches and chemical warmers  removed Titanium eyewear removed Nail polish cured greater than 10 hours NA Casting material cured greater than 10 hours NA Hearing aids removed NA Loose dentures or partials removed NA Prosthetics have been removed NA Patient demonstrates correct use of air break device (if applicable) Patient concerns have been addressed Patient grounding bracelet on and cord  attached to chamber Specifics for Inpatients (complete in addition to above) Medication sheet sent with patient NA Intravenous medications needed or due during therapy sent with patient NA Drainage tubes (e.g. nasogastric tube or chest tube secured and vented) NA Endotracheal or Tracheotomy tube secured NA Cuff deflated of air and inflated with saline NA Airway suctioned NA Notes Paper version used prior to treatment start. Electronic Signature(s) Signed: 03/10/2023 8:46:57 AM By: [name] CHT EMT BS , , Entered By: [name] on 03/10/2023 08:46:57

## 2023-03-10 NOTE — Telephone Encounter (Signed)
[employer] forms received. To [employer]. 

## 2023-03-10 NOTE — Progress Notes (Signed)
[name], [name] ([id number]) [id number] Page 1 of 1 Visit Report for 03/10/2023 SuperBill Details Patient Name: Date of Service: [place], [name] 03/10/2023 Medical Record Number: [id number] Patient Account Number: [id number] Date of Birth/Sex: Treating RN: [birth year] (57 y.o. 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 J44.9 Chronic obstructive pulmonary disease, unspecified M06.9 Rheumatoid arthritis, unspecified Z87.891 Personal history of nicotine dependence Facility Procedures CPT4 Code Description Modifier Quantity [id number] G0277-(Facility Use Only) HBOT full body chamber, , 4 ICD-10 Diagnosis Description 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 S91.302A Unspecified open wound, left foot, initial encounter 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 Physician Procedures Quantity CPT4 Code Description Modifier [phone] 99183 - WC PHYS HYPERBARIC OXYGEN THERAPY 1 ICD-10 Diagnosis Description 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

## 2023-03-10 NOTE — Progress Notes (Addendum)
GLORIA, DELAMATER ([id number]) [id number] Page 1 of 2 Visit Report for 03/10/2023 Arrival Information Details Patient Name: Date of Service: [place], [name] 03/10/2023 7:30 A M Medical Record Number: [id number] Patient Account Number: [id number] Date of Birth/Sex: Treating RN: 1965-12-14 (57 y.o. [name] Primary Care : [name] Other Clinician: [name] Referring : Treating /Extender: Donnetta Hail in Treatment: 10 Visit Information History Since Last Visit All ordered tests and consults were completed: Yes Patient Arrived: Ambulatory Added or deleted any medications: No Arrival Time: 07:28 Any new allergies or adverse reactions: No Accompanied By: significant other Had a fall or experienced change in No Transfer Assistance: None activities of daily living that may affect Patient Identification Verified: Yes risk of falls: Secondary Verification Process Completed: Yes Signs or symptoms of abuse/neglect since last visito No Patient Requires Transmission-Based Precautions: No Hospitalized since last visit: No Patient Has Alerts: No Implantable device outside of the clinic excluding No cellular tissue based products placed in the center since last visit: Pain Present Now: No Electronic Signature(s) Signed: 03/10/2023 12:16:03 PM By: [name] CHT EMT BS , , Previous Signature: 03/10/2023 8:44:42 AM Version By: [name] CHT EMT BS , , Entered By: [name] on 03/10/2023 12:16:03 -------------------------------------------------------------------------------- Encounter Discharge Information Details Patient Name: Date of Service: [name], [name] 03/10/2023 7:30 A M Medical Record Number: [id number] Patient Account Number: [id number] Date of Birth/Sex: Treating RN: 1966-07-16 (57 y.o. [name] Primary Care : [name] Other  Clinician: [name] Referring : Treating /Extender: Donnetta Hail in Treatment: 10 Encounter Discharge Information Items Discharge Condition: Stable Ambulatory Status: Ambulatory Discharge Destination: Home Transportation: Private Auto Accompanied By: significant other Schedule Follow-up Appointment: No Clinical Summary of Care: Electronic Signature(s) Signed: 03/10/2023 12:15:47 PM By: [name] CHT EMT BS , , Entered By: [name] on 03/10/2023 12:15:47 [name] ([id number]) [id number] Page 2 of 2 -------------------------------------------------------------------------------- Vitals Details Patient Name: Date of Service: [place], [name] 03/10/2023 7:30 A M Medical Record Number: [id number] Patient Account Number: [id number] Date of Birth/Sex: Treating RN: [birth year] (57 y.o. [name] Primary Care : [name] Other Clinician: [name] Referring : Treating /Extender: Donnetta Hail in Treatment: 10 Vital Signs Time Taken: 07:45 Temperature (F): 97.9 Height (in): 68 Pulse (bpm): 69 Weight (lbs): 200 Respiratory Rate (breaths/min): 18 Body Mass Index (BMI): 30.4 Blood Pressure (mmHg): 134/65 Capillary Blood Glucose (mg/dl): 010 Reference Range: 80 - 120 mg / dl Electronic Signature(s) Signed: 03/10/2023 8:45:03 AM By: [name] CHT EMT BS , , Entered By: [name] on 03/10/2023 08:45:03

## 2023-03-11 ENCOUNTER — Encounter (HOSPITAL_BASED_OUTPATIENT_CLINIC_OR_DEPARTMENT_OTHER): Payer: 59 | Admitting: Physician Assistant

## 2023-03-11 DIAGNOSIS — S91302A Unspecified open wound, left foot, initial encounter: Secondary | ICD-10-CM | POA: Diagnosis not present

## 2023-03-11 LAB — GLUCOSE, CAPILLARY
Glucose-Capillary: 206 mg/dL — ABNORMAL HIGH (ref 70–99)
Glucose-Capillary: 247 mg/dL — ABNORMAL HIGH (ref 70–99)

## 2023-03-11 NOTE — Progress Notes (Addendum)
[name], [name] ([id number]) [id number] Page 1 of 2 Visit Report for 03/11/2023 Arrival Information Details Patient Name: Date of Service: [place], [name] 03/11/2023 7:30 A M Medical Record Number: [id number] Patient Account Number: [id number] Date of Birth/Sex: Treating RN: 1966/04/14 (57 y.o. [name] Primary Care : [name] Other Clinician: [name] Referring : Treating /Extender: [name] in Treatment: 10 Visit Information History Since Last Visit All ordered tests and consults were completed: Yes Patient Arrived: Ambulatory Added or deleted any medications: No Arrival Time: 07:33 Any new allergies or adverse reactions: No Accompanied By: self Had a fall or experienced change in No Transfer Assistance: None activities of daily living that may affect Patient Identification Verified: Yes risk of falls: Secondary Verification Process Completed: Yes Signs or symptoms of abuse/neglect since last visito No Patient Requires Transmission-Based Precautions: No Hospitalized since last visit: No Patient Has Alerts: No Implantable device outside of the clinic excluding No cellular tissue based products placed in the center since last visit: Has Dressing in Place as Prescribed: Yes Has Compression in Place as Prescribed: Yes Has Footwear/Offloading in Place as Prescribed: Yes Pain Present Now: No Electronic Signature(s) Signed: 03/11/2023 11:46:01 AM By: [name] CHT EMT BS , , Previous Signature: 03/11/2023 9:07:39 AM Version By: [name] CHT EMT BS , , Entered By: [name] on 03/11/2023 11:46:01 -------------------------------------------------------------------------------- Encounter Discharge Information Details Patient Name: Date of Service: [name], [name] 03/11/2023 7:30 A M Medical Record Number: [id number] Patient Account Number:  [id number] Date of Birth/Sex: Treating RN: Aug 24, 1965 (57 y.o. [name] Primary Care : [name] Other Clinician: [name] Referring : Treating /Extender: [name] in Treatment: 10 Encounter Discharge Information Items Discharge Condition: Stable Ambulatory Status: Ambulatory Discharge Destination: Home Transportation: Private Auto Accompanied By: self Schedule Follow-up Appointment: No Clinical Summary of Care: Electronic Signature(s) Signed: 03/11/2023 11:36:56 AM By: [name] CHT EMT BS , , Entered By: [name] on 03/11/2023 11:36:55 [name], [name] ([id number]) [id number] Page 2 of 2 -------------------------------------------------------------------------------- Vitals Details Patient Name: Date of Service: [name], [name] 03/11/2023 7:30 A M Medical Record Number: [id number] Patient Account Number: [id number] Date of Birth/Sex: Treating RN: August 02, 1966 (57 y.o. [name] Primary Care : [name] Other Clinician: [name] Referring : Treating /Extender: [name] in Treatment: 10 Vital Signs Time Taken: 07:38 Temperature (F): 97.3 Height (in): 68 Pulse (bpm): 75 Weight (lbs): 200 Respiratory Rate (breaths/min): 18 Body Mass Index (BMI): 30.4 Blood Pressure (mmHg): 126/60 Capillary Blood Glucose (mg/dl): 010 Reference Range: 80 - 120 mg / dl Electronic Signature(s) Signed: 03/11/2023 9:08:31 AM By: [name] CHT EMT BS , , Entered By: [name] on 03/11/2023 09:08:31

## 2023-03-11 NOTE — Progress Notes (Signed)
MALI, JUAIRE ([id number]) [id number] Page 1 of 2 Visit Report for 03/11/2023 HBO Details Patient Name: Date of Service: [place], [name] 03/11/2023 7:30 A M Medical Record Number: [id number] Patient Account Number: [id number] Date of Birth/Sex: Treating RN: [birth year] (57 y.o. [name] Primary Care : [name] Other Clinician: [name] Referring : Treating /Extender: [name] in Treatment: 10 HBO Treatment Course Details Treatment Course Number: 1 Ordering : Geralyn Corwin T Treatments Ordered: otal 40 HBO Treatment Start Date: 02/16/2023 HBO Indication: Other (specify in Notes) Notes: Acute peripheral arterial insufficiency HBO Treatment Details Treatment Number: 14 Patient Type: Outpatient Chamber Type: Monoplace Chamber Serial #: [id number] Treatment Protocol: 2.0 ATA with 90 minutes oxygen, with two 5 minute air breaks Treatment Details Compression Rate Down: 1.0 psi / minute De-Compression Rate Up: 1.5 psi / minute A breaks and breathing ir Compress Tx Pressure periods Decompress Decompress Begins Reached (leave unused spaces Begins Ends blank) Chamber Pressure (ATA 1 2 2 2 2 2  --2 1 ) Clock Time (24 hr) 08:03 08:17 08:47 08:52 09:22 09:27 - - 09:57 10:07 Treatment Length: 124 (minutes) Treatment Segments: 4 Vital Signs Capillary Blood Glucose Reference Range: 80 - 120 mg / dl HBO Diabetic Blood Glucose Intervention Range: <131 mg/dl or >865 mg/dl Type: Time Vitals Blood Respiratory Capillary Blood Glucose Pulse Action Pulse: Temperature: Taken: Pressure: Rate: Glucose (mg/dl): Meter #: Oximetry (%) Taken: Pre 07:38 126/60 75 18 97.3 206 none per protocol Post 10:13 247 none per protocol Post 10:30 159/51 71 18 97.1 manual BP taken Post 10:32 132/60 manual BP Treatment Response Treatment Toleration: Well Treatment Completion Status: Treatment  Completed without Adverse Event Treatment Notes Mr. Zavatsky arrived with normal vital signs. He prepared for treatment. After performing a safety check, patient was placed in the chamber at a rate of 1.5 psi/min after confirmation of normal ear equalization. He tolerated the treatment and subsequent decompression at a rate of 1.5 psi/min. He denied any issues with ear equalization and/or pain associated with barotrauma. Post-treatment vital signs were within normal range except for diastolic BP at 51 mmHg. He denied symptoms of hypotension. A manual BP was taken with result of 132/60 mmHg. He was stable upon discharge. Electronic Signature(s) Signed: 03/11/2023 11:35:09 AM By: [name] CHT EMT BS , , Signed: 03/11/2023 7:55:21 PM By: Allen Derry PA-C Entered By: [name] on 03/11/2023 11:35:09 [name] ([id number]) [id number] Page 2 of 2 -------------------------------------------------------------------------------- HBO Safety Checklist Details Patient Name: Date of Service: [place], [name] 03/11/2023 7:30 A M Medical Record Number: [id number] Patient Account Number: [id number] Date of Birth/Sex: Treating RN: 04-05-1966 (57 y.o. [name] Primary Care : [name] Other Clinician: [name] Referring : Treating /Extender: [name] in Treatment: 10 HBO Safety Checklist Items Safety Checklist Consent Form Signed Patient voided / foley secured and emptied When did you last eato 0645 Egg and Cheese Sandwich Last dose of injectable or oral agent 0630 Ostomy pouch emptied and vented if applicable NA All implantable devices assessed, documented and approved NA Intravenous access site secured and place NA Valuables secured Linens and cotton and cotton/polyester blend (less than 51% polyester) Personal oil-based products / skin lotions / body lotions removed Wigs or  hairpieces removed NA Smoking or tobacco materials removed NA Books / newspapers / magazines / loose paper removed Cologne, aftershave, perfume and deodorant removed Jewelry removed (may wrap wedding band) Make-up removed NA Hair care products  removed Battery operated devices (external) removed Heating patches and chemical warmers removed Titanium eyewear removed Nail polish cured greater than 10 hours NA Casting material cured greater than 10 hours NA Hearing aids removed NA Loose dentures or partials removed NA Prosthetics have been removed NA Patient demonstrates correct use of air break device (if applicable) Patient concerns have been addressed Patient grounding bracelet on and cord attached to chamber Specifics for Inpatients (complete in addition to above) Medication sheet sent with patient NA Intravenous medications needed or due during therapy sent with patient NA Drainage tubes (e.g. nasogastric tube or chest tube secured and vented) NA Endotracheal or Tracheotomy tube secured NA Cuff deflated of air and inflated with saline NA Airway suctioned NA Notes Paper version used prior to treatment start. Electronic Signature(s) Signed: 03/11/2023 11:25:32 AM By: [name] CHT EMT BS , , Entered By: [name] on 03/11/2023 11:25:31

## 2023-03-12 ENCOUNTER — Encounter (HOSPITAL_BASED_OUTPATIENT_CLINIC_OR_DEPARTMENT_OTHER): Payer: 59 | Admitting: Internal Medicine

## 2023-03-12 DIAGNOSIS — E10621 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: Secondary | ICD-10-CM | POA: Diagnosis not present

## 2023-03-12 DIAGNOSIS — I70245 Atherosclerosis of native arteries of left leg with ulceration of other part of foot: Secondary | ICD-10-CM

## 2023-03-12 DIAGNOSIS — S91302A Unspecified open wound, left foot, initial encounter: Secondary | ICD-10-CM | POA: Diagnosis not present

## 2023-03-12 DIAGNOSIS — L97528 Non-pressure chronic ulcer of other part of left foot with other specified severity: Secondary | ICD-10-CM

## 2023-03-12 LAB — GLUCOSE, CAPILLARY
Glucose-Capillary: 130 mg/dL — ABNORMAL HIGH (ref 70–99)
Glucose-Capillary: 150 mg/dL — ABNORMAL HIGH (ref 70–99)
Glucose-Capillary: 194 mg/dL — ABNORMAL HIGH (ref 70–99)

## 2023-03-12 NOTE — Progress Notes (Addendum)
TRESHAWN, PLEVA ([id number]) [id number] Page 1 of 2 Visit Report for 03/12/2023 Arrival Information Details Patient Name: Date of Service: [place], [name] 03/12/2023 7:30 A M Medical Record Number: [id number] Patient Account Number: [id number] Date of Birth/Sex: Treating RN: [birth year] (57 y.o. [name] Primary Care : [name] Other Clinician: [name] Referring : Treating /Extender: Donnetta Hail in Treatment: 11 Visit Information History Since Last Visit All ordered tests and consults were completed: Yes Patient Arrived: Ambulatory Added or deleted any medications: No Arrival Time: 07:38 Any new allergies or adverse reactions: No Accompanied By: self Had a fall or experienced change in No Transfer Assistance: None activities of daily living that may affect Patient Identification Verified: Yes risk of falls: Secondary Verification Process Completed: Yes Signs or symptoms of abuse/neglect since last visito No Patient Requires Transmission-Based Precautions: No Hospitalized since last visit: No Patient Has Alerts: No Implantable device outside of the clinic excluding No cellular tissue based products placed in the center since last visit: Pain Present Now: No Electronic Signature(s) Signed: 03/12/2023 8:49:51 AM By: [name] CHT EMT BS , , Entered By: [name] on 03/12/2023 08:49:51 -------------------------------------------------------------------------------- Encounter Discharge Information Details Patient Name: Date of Service: [name], [name] 03/12/2023 7:30 A M Medical Record Number: [id number] Patient Account Number: [id number] Date of Birth/Sex: Treating RN: 1966/05/08 (57 y.o. [name] Primary Care : [name] Other Clinician: [name] Referring : Treating /Extender: Donnetta Hail in Treatment: 11 Encounter Discharge Information Items Discharge Condition: Stable Ambulatory Status: Ambulatory Discharge Destination: Home Transportation: Private Auto Accompanied By: significant other Schedule Follow-up Appointment: No Clinical Summary of Care: Electronic Signature(s) Signed: 03/12/2023 11:34:35 AM By: [name] CHT EMT BS , , Previous Signature: 03/12/2023 11:34:24 AM Version By: [name] CHT EMT BS , , Entered By: [name] on 03/12/2023 11:34:35 Mcguffee, [name] ([id number]) [id number] Page 2 of 2 -------------------------------------------------------------------------------- Vitals Details Patient Name: Date of Service: [place], [name] 03/12/2023 7:30 A M Medical Record Number: [id number] Patient Account Number: [id number] Date of Birth/Sex: Treating RN: 05-Jul-1966 (57 y.o. [name] Primary Care : [name] Other Clinician: [name] Referring : Treating /Extender: Donnetta Hail in Treatment: 11 Vital Signs Time Taken: 07:47 Temperature (F): 97.9 Height (in): 68 Pulse (bpm): 72 Weight (lbs): 200 Respiratory Rate (breaths/min): 18 Body Mass Index (BMI): 30.4 Blood Pressure (mmHg): 107/57 Capillary Blood Glucose (mg/dl): 433 Reference Range: 80 - 120 mg / dl Electronic Signature(s) Signed: 03/12/2023 8:50:23 AM By: [name] CHT EMT BS , , Entered By: [name] on 03/12/2023 08:50:23

## 2023-03-12 NOTE — Progress Notes (Signed)
[name], [name] ([id number]) [id number] Page 1 of 1 Visit Report for 03/11/2023 SuperBill Details Patient Name: Date of Service: [place], [name] 03/11/2023 Medical Record Number: [id number] Patient Account Number: [id number] Date of Birth/Sex: Treating RN: [birth year] (57 y.o. 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 J44.9 Chronic obstructive pulmonary disease, unspecified M06.9 Rheumatoid arthritis, unspecified Z87.891 Personal history of nicotine dependence Facility Procedures CPT4 Code Description Modifier Quantity [id number] G0277-(Facility Use Only) HBOT full body chamber, , 4 ICD-10 Diagnosis Description 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 S91.302A Unspecified open wound, left foot, initial encounter 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 Physician Procedures Quantity CPT4 Code Description Modifier [id number] - WC PHYS HYPERBARIC OXYGEN THERAPY 1 ICD-10 Diagnosis Description 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

## 2023-03-12 NOTE — Progress Notes (Addendum)
[name], [name] ([id number]) [id number] Page 1 of 2 Visit Report for 03/12/2023 HBO Details Patient Name: Date of Service: [place], [name] 03/12/2023 7:30 A M Medical Record Number: [id number] Patient Account Number: [id number] Date of Birth/Sex: Treating RN: 06/13/1966 (57 y.o. [name] Primary Care : [name] Other Clinician: [name] Referring : Treating /Extender: Donnetta Hail in Treatment: 11 HBO Treatment Course Details Treatment Course Number: 1 Ordering : Geralyn Corwin T Treatments Ordered: otal 40 HBO Treatment Start Date: 02/16/2023 HBO Indication: Other (specify in Notes) Notes: Acute peripheral arterial insufficiency HBO Treatment Details Treatment Number: 15 Patient Type: Outpatient Chamber Type: Monoplace Chamber Serial #: [id number] Treatment Protocol: 2.0 ATA with 90 minutes oxygen, with two 5 minute air breaks Treatment Details Compression Rate Down: 1.5 psi / minute De-Compression Rate Up: 2.0 psi / minute A breaks and breathing ir Compress Tx Pressure periods Decompress Decompress Begins Reached (leave unused spaces Begins Ends blank) Chamber Pressure (ATA 1 2 2 2 2 2  --2 1 ) Clock Time (24 hr) 08:15 08:25 08:55 09:00 09:30 09:35 - - 10:05 10:15 Treatment Length: 120 (minutes) Treatment Segments: 4 Vital Signs Capillary Blood Glucose Reference Range: 80 - 120 mg / dl HBO Diabetic Blood Glucose Intervention Range: <131 mg/dl or >413 mg/dl Type: Time Vitals Blood Pulse: Respiratory Temperature: Capillary Blood Glucose Pulse Action Taken: Pressure: Rate: Glucose (mg/dl): Meter #: Oximetry (%) Taken: Pre 07:47 107/57 72 18 97.9 130 asymptomatic for hypotension/ re-measure CBG Pre 08:01 150 Proceed with HBO Tx Post 10:28 136/58 70 18 97.5 194 none per protocol. Treatment Response Treatment Toleration: Well Treatment Completion Status: Treatment  Completed without Adverse Event Treatment Notes Mr. Minion arrived with vital signs within normal range except blood glucose level. Patient stated that he consumed a protein shake, eggs and sausage, orange juice, and coffee expecting blood glucose to be higher. Blood glucose level was 130 mg/dL at 2440. Based on meal information, we decided to re-measure blood glucose. Patient prepared for treatment. Secondary blood glucose without intervention was 150 mg/dL at 1027. After performing a safety check, patient was placed in the chamber which was compressed at approximately 1.8 psi/min. He tolerated treatment and subsequent decompression at the rate of 2.0 PSI per minute. He denied any issues with ear equalization. First treatment vital signs were within normal range. He was stable upon discharge. Physician HBO Attestation: I certify that I supervised this HBO treatment in accordance with Medicare guidelines. A trained emergency response team is readily available per Yes hospital policies and procedures. Continue HBOT as ordered. Yes Electronic Signature(s) Signed: 03/13/2023 12:35:18 PM By: Geralyn Corwin DO Previous Signature: 03/13/2023 12:24:18 PM Version By: Geralyn Corwin DO Previous Signature: 03/12/2023 11:32:36 AM Version By: [name] CHT EMT BS , , Previous Signature: 03/12/2023 9:01:18 AM Version By: [name] CHT EMT BS , , Entered By: Geralyn Corwin on 03/13/2023 12:34:39 [name] ([id number]) [id number] Page 2 of 2 -------------------------------------------------------------------------------- HBO Safety Checklist Details Patient Name: Date of Service: [place], [name] 03/12/2023 7:30 A M Medical Record Number: [id number] Patient Account Number: [id number] Date of Birth/Sex: Treating RN: Feb 29, 1966 (57 y.o. [name] Primary Care : [name] Other Clinician: [name] Referring : Treating  /Extender: Donnetta Hail in Treatment: 11 HBO Safety Checklist Items Safety Checklist Consent Form Signed Patient voided / foley secured and emptied 0700 - Protein Shake, Eggs and Sausage, OJ, When did you last eato Coffee Last dose of injectable  or oral agent 0630 Lantus 17 units Ostomy pouch emptied and vented if applicable NA All implantable devices assessed, documented and approved Freestyle Libre 2 and3 on approved list Intravenous access site secured and place NA Valuables secured Linens and cotton and cotton/polyester blend (less than 51% polyester) Personal oil-based products / skin lotions / body lotions removed Wigs or hairpieces removed NA Smoking or tobacco materials removed NA Books / newspapers / magazines / loose paper removed Cologne, aftershave, perfume and deodorant removed Jewelry removed (may wrap wedding band) Make-up removed NA Hair care products removed Battery operated devices (external) removed Heating patches and chemical warmers removed Titanium eyewear removed Nail polish cured greater than 10 hours NA Casting material cured greater than 10 hours NA Hearing aids removed NA Loose dentures or partials removed NA Prosthetics have been removed NA Patient demonstrates correct use of air break device (if applicable) Patient concerns have been addressed Patient grounding bracelet on and cord attached to chamber Specifics for Inpatients (complete in addition to above) Medication sheet sent with patient NA Intravenous medications needed or due during therapy sent with patient NA Drainage tubes (e.g. nasogastric tube or chest tube secured and vented) NA Endotracheal or Tracheotomy tube secured NA Cuff deflated of air and inflated with saline NA Airway suctioned NA Notes Paper version used prior to treatment start. Electronic Signature(s) Signed: 03/12/2023 8:53:10 AM By: [name] CHT EMT BS ,  , Entered By: [name] on 03/12/2023 08:53:10

## 2023-03-13 ENCOUNTER — Encounter (HOSPITAL_BASED_OUTPATIENT_CLINIC_OR_DEPARTMENT_OTHER): Payer: 59 | Admitting: Internal Medicine

## 2023-03-13 DIAGNOSIS — I70245 Atherosclerosis of native arteries of left leg with ulceration of other part of foot: Secondary | ICD-10-CM

## 2023-03-13 DIAGNOSIS — E10621 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: Secondary | ICD-10-CM | POA: Diagnosis not present

## 2023-03-13 DIAGNOSIS — S91302A Unspecified open wound, left foot, initial encounter: Secondary | ICD-10-CM | POA: Diagnosis not present

## 2023-03-13 DIAGNOSIS — L97528 Non-pressure chronic ulcer of other part of left foot with other specified severity: Secondary | ICD-10-CM

## 2023-03-13 LAB — GLUCOSE, CAPILLARY
Glucose-Capillary: 113 mg/dL — ABNORMAL HIGH (ref 70–99)
Glucose-Capillary: 131 mg/dL — ABNORMAL HIGH (ref 70–99)
Glucose-Capillary: 192 mg/dL — ABNORMAL HIGH (ref 70–99)

## 2023-03-13 NOTE — Progress Notes (Signed)
ANARI, TINSON ([id number]) [id number] Page 1 of 1 Visit Report for 03/13/2023 SuperBill Details Patient Name: Date of Service: [name], [name] 03/13/2023 Medical Record Number: [id number] Patient Account Number: [id number] Date of Birth/Sex: Treating RN: 1966-02-23 (57 y.o. 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 J44.9 Chronic obstructive pulmonary disease, unspecified M06.9 Rheumatoid arthritis, unspecified Z87.891 Personal history of nicotine dependence Facility Procedures CPT4 Code Description Modifier Quantity [id number] G0277-(Facility Use Only) HBOT full body chamber, , 4 ICD-10 Diagnosis Description 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 S91.302A Unspecified open wound, left foot, initial encounter 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 Physician Procedures Quantity CPT4 Code Description Modifier [id number] 99183 - WC PHYS HYPERBARIC OXYGEN THERAPY 1 ICD-10 Diagnosis Description 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

## 2023-03-13 NOTE — Progress Notes (Signed)
JOHNCARLO, LAXSON ([id number]) [id number] Page 1 of 1 Visit Report for 03/13/2023 Arrival Information Details Patient Name: Date of Service: [place], [name] 03/13/2023 7:30 A M Medical Record Number: [id number] Patient Account Number: [id number] Date of Birth/Sex: Treating RN: 10-22-1965 (57 y.o. [name], [name] Primary Care : [name] Other Clinician: [name] Referring : Treating /Extender: Donnetta Hail in Treatment: 11 Visit Information History Since Last Visit All ordered tests and consults were completed: Yes Patient Arrived: Ambulatory Added or deleted any medications: No Arrival Time: 07:47 Any new allergies or adverse reactions: No Accompanied By: self Had a fall or experienced change in No Transfer Assistance: None activities of daily living that may affect Patient Identification Verified: Yes risk of falls: Secondary Verification Process Completed: Yes Signs or symptoms of abuse/neglect since last visito No Patient Requires Transmission-Based Precautions: No Hospitalized since last visit: No Patient Has Alerts: No Implantable device outside of the clinic excluding No cellular tissue based products placed in the center since last visit: Pain Present Now: No Electronic Signature(s) Signed: 03/13/2023 9:25:13 AM By: [name] CHT EMT BS , , Entered By: [name] on 03/13/2023 09:25:13 -------------------------------------------------------------------------------- Vitals Details Patient Name: Date of Service: [name], [name] 03/13/2023 7:30 A M Medical Record Number: [id number] Patient Account Number: [id number] Date of Birth/Sex: Treating RN: 31-Jan-1966 (57 y.o. [name], [name] Primary Care : [name] Other Clinician: [name] Referring : Treating /Extender: Donnetta Hail in Treatment:  11 Vital Signs Time Taken: 07:51 Temperature (F): 97.2 Height (in): 68 Pulse (bpm): 73 Weight (lbs): 200 Respiratory Rate (breaths/min): 18 Body Mass Index (BMI): 30.4 Blood Pressure (mmHg): 135/65 Capillary Blood Glucose (mg/dl): 086 Reference Range: 80 - 120 mg / dl Electronic Signature(s) Signed: 03/13/2023 9:25:41 AM By: [name] CHT EMT BS , , Entered By: [name] on 03/13/2023 09:25:41

## 2023-03-13 NOTE — Progress Notes (Addendum)
[name], [name] ([id number]) [id number] Page 1 of 2 Visit Report for 03/13/2023 HBO Details Patient Name: Date of Service: [place], [name] 03/13/2023 7:30 A M Medical Record Number: [id number] Patient Account Number: [id number] Date of Birth/Sex: Treating RN: Aug 26, 1965 (57 y.o. [name], [name] Primary Care : [name] Other Clinician: [name] Referring : Treating /Extender: Donnetta Hail in Treatment: 11 HBO Treatment Course Details Treatment Course Number: 1 Ordering : Geralyn Corwin T Treatments Ordered: otal 40 HBO Treatment Start Date: 02/16/2023 HBO Indication: Other (specify in Notes) Notes: Acute peripheral arterial insufficiency HBO Treatment Details Treatment Number: 16 Patient Type: Outpatient Chamber Type: Monoplace Chamber Serial #: [id number] Treatment Protocol: 2.0 ATA with 90 minutes oxygen, with two 5 minute air breaks Treatment Details Compression Rate Down: 1.5 psi / minute De-Compression Rate Up: 2.0 psi / minute A breaks and breathing ir Compress Tx Pressure periods Decompress Decompress Begins Reached (leave unused spaces Begins Ends blank) Chamber Pressure (ATA 1 2 2 2 2 2  --2 1 ) Clock Time (24 hr) 08:24 08:36 09:06 09:11 09:41 09:46 - - 10:16 10:27 Treatment Length: 123 (minutes) Treatment Segments: 4 Vital Signs Capillary Blood Glucose Reference Range: 80 - 120 mg / dl HBO Diabetic Blood Glucose Intervention Range: <131 mg/dl or >782 mg/dl Type: Time Vitals Blood Pulse: Respiratory Temperature: Capillary Blood Glucose Pulse Action Taken: Pressure: Rate: Glucose (mg/dl): Meter #: Oximetry (%) Taken: Pre 07:51 135/65 73 18 97.2 113 historically stable pt, waited for a 2nd reading Pre 08:14 131 Proceed with HBO Tx Post 10:30 123/57 66 18 97.3 192 none per protocol. Treatment Response Treatment Toleration: Well Treatment Completion Status: Treatment  Completed without Adverse Event Treatment Notes [name] arrived with vital signs within normal range except blood glucose level. Patient stated that he consumed a protein shake, eggs and sausage sandwich, coffee expecting blood glucose to be higher. Blood glucose level was 113 mg/dL at 9562. Based on meal information, being historically stable with blood glucose during HBOT we decided to re-measure blood glucose. Patient prepared for treatment. Secondary blood glucose without intervention was 131 , mg/dL at 1308. Per protocol, he was able to proceed with HBOT After performing a safety check, patient was placed in the chamber which was compressed at . approximately 1.8 psi/min after confirming normal ear equalization. He tolerated the treatment and subsequent decompression of the chamber at the rate of about 1.8 psi/min. He denied any issues with ear equalization and/or pain associated with barotrauma. His post-treatment vital signs were within normal range. Physician HBO Attestation: I certify that I supervised this HBO treatment in accordance with Medicare guidelines. A trained emergency response team is readily available per Yes hospital policies and procedures. Continue HBOT as ordered. Yes Electronic Signature(s) Signed: 03/13/2023 12:24:18 PM By: Geralyn Corwin DO Previous Signature: 03/13/2023 11:38:04 AM Version By: [name] CHT EMT BS , , Previous Signature: 03/13/2023 11:36:47 AM Version By: [name] CHT EMT BS , , Previous Signature: 03/13/2023 9:34:16 AM Version By: [name] CHT EMT BS , , Entered By: Geralyn Corwin on 03/13/2023 12:23:03 [name] ([id number]) [id number] Page 2 of 2 -------------------------------------------------------------------------------- HBO Safety Checklist Details Patient Name: Date of Service: [name], [name] 03/13/2023 7:30 A M Medical Record Number: [id number] Patient Account Number:  [id number] Date of Birth/Sex: Treating RN: 1966-01-17 (57 y.o. [name] Primary Care : [name] Other Clinician: [name] Referring : Treating /Extender: Donnetta Hail in Treatment: 11 HBO Safety  Checklist Items Safety Checklist Consent Form Signed Patient voided / foley secured and emptied 0700-Protein Shake, Egg and Sausage Sandwich, When did you last eato Coffee Last dose of injectable or oral agent 0645-17 units of Lantus Ostomy pouch emptied and vented if applicable NA All implantable devices assessed, documented and approved Freestyle Libre 2 and3 on approved list Intravenous access site secured and place NA Valuables secured Linens and cotton and cotton/polyester blend (less than 51% polyester) Personal oil-based products / skin lotions / body lotions removed Wigs or hairpieces removed NA Smoking or tobacco materials removed NA Books / newspapers / magazines / loose paper removed Cologne, aftershave, perfume and deodorant removed Jewelry removed (may wrap wedding band) Make-up removed NA Hair care products removed Battery operated devices (external) removed Heating patches and chemical warmers removed Titanium eyewear removed Nail polish cured greater than 10 hours Casting material cured greater than 10 hours NA Hearing aids removed NA Loose dentures or partials removed NA Prosthetics have been removed NA Patient demonstrates correct use of air break device (if applicable) Patient concerns have been addressed Patient grounding bracelet on and cord attached to chamber Specifics for Inpatients (complete in addition to above) Medication sheet sent with patient NA Intravenous medications needed or due during therapy sent with patient NA Drainage tubes (e.g. nasogastric tube or chest tube secured and vented) NA Endotracheal or Tracheotomy tube secured NA Cuff deflated of air and  inflated with saline NA Airway suctioned NA Notes Paper version used prior to treatment start. Electronic Signature(s) Signed: 03/13/2023 9:28:37 AM By: [name] CHT EMT BS , , Entered By: [name] on 03/13/2023 56:21:30

## 2023-03-16 ENCOUNTER — Ambulatory Visit: Payer: 59 | Admitting: Orthopedic Surgery

## 2023-03-16 ENCOUNTER — Encounter: Payer: Self-pay | Admitting: Orthopedic Surgery

## 2023-03-16 ENCOUNTER — Encounter (HOSPITAL_BASED_OUTPATIENT_CLINIC_OR_DEPARTMENT_OTHER): Payer: 59 | Admitting: Internal Medicine

## 2023-03-16 DIAGNOSIS — L97528 Non-pressure chronic ulcer of other part of left foot with other specified severity: Secondary | ICD-10-CM | POA: Diagnosis not present

## 2023-03-16 DIAGNOSIS — L97423 Non-pressure chronic ulcer of left heel and midfoot with necrosis of muscle: Secondary | ICD-10-CM

## 2023-03-16 DIAGNOSIS — S91302A Unspecified open wound, left foot, initial encounter: Secondary | ICD-10-CM | POA: Diagnosis not present

## 2023-03-16 DIAGNOSIS — I70245 Atherosclerosis of native arteries of left leg with ulceration of other part of foot: Secondary | ICD-10-CM

## 2023-03-16 DIAGNOSIS — E10621 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: Secondary | ICD-10-CM | POA: Diagnosis not present

## 2023-03-16 LAB — GLUCOSE, CAPILLARY
Glucose-Capillary: 175 mg/dL — ABNORMAL HIGH (ref 70–99)
Glucose-Capillary: 225 mg/dL — ABNORMAL HIGH (ref 70–99)

## 2023-03-16 MED ORDER — LEVOFLOXACIN 750 MG PO TABS: 750.0000 mg | ORAL_TABLET | Freq: Every day | ORAL | 0 refills | Status: DC

## 2023-03-16 NOTE — Progress Notes (Signed)
ANKER, KRAVEC ([id number]) [id number] Page 1 of 2 Visit Report for 03/16/2023 Arrival Information Details Patient Name: Date of Service: [name], [name] 03/16/2023 7:30 A M Medical Record Number: [id number] Patient Account Number: [id number] Date of Birth/Sex: Treating RN: 14-Apr-1966 (57 y.o. [name] Primary Care : [name] Other Clinician: Referring : Treating /Extender: Donnetta Hail in Treatment: 11 Visit Information History Since Last Visit Pain Present Now: No Patient Arrived: Ambulatory Arrival Time: 08:00 Accompanied By: self Transfer Assistance: None Patient Identification Verified: Yes Secondary Verification Process Completed: Yes Patient Requires Transmission-Based Precautions: No Patient Has Alerts: No Electronic Signature(s) Signed: 03/16/2023 3:41:29 PM By: Zenaida Deed RN, BSN Entered By: Zenaida Deed on 03/16/2023 11:26:55 -------------------------------------------------------------------------------- Encounter Discharge Information Details Patient Name: Date of Service: [name] Pont, [name] [name] 03/16/2023 7:30 A M Medical Record Number: [id number] Patient Account Number: [id number] Date of Birth/Sex: Treating RN: [birth year] (57 y.o. [name] Primary Care : [name] Other Clinician: Referring : Treating /Extender: Donnetta Hail in Treatment: 11 Encounter Discharge Information Items Discharge Condition: Stable Ambulatory Status: Ambulatory Discharge Destination: Home Transportation: Private Auto Accompanied By: spouse Schedule Follow-up Appointment: Yes Clinical Summary of Care: Patient Declined Electronic Signature(s) Signed: 03/16/2023 3:41:29 PM By: Zenaida Deed RN, BSN Entered By: Zenaida Deed on 03/16/2023 11:35:08 Patient/Caregiver Education  Details -------------------------------------------------------------------------------- [name] ([id number]) [id number] Page 2 of 2 Patient Name: Date of Service: [name], [name] 8/12/2024andnbsp7:30 A M Medical Record Number: [id number] Patient Account Number: [id number] Date of Birth/Gender: Treating RN: 22-Mar-1966 (57 y.o. [name] Primary Care Physician: [name] Other Clinician: Referring Physician: Treating Physician/Extender: Donnetta Hail in Treatment: 11 Education Assessment Education Provided To: Patient Education Topics Provided Elevated Blood Sugar/ Impact on Healing: Methods: Explain/Verbal Responses: Reinforcements needed, State content correctly Hyperbaric Oxygenation: Methods: Explain/Verbal Responses: Reinforcements needed, State content correctly Electronic Signature(s) Signed: 03/16/2023 3:41:29 PM By: Zenaida Deed RN, BSN Entered By: Zenaida Deed on 03/16/2023 11:34:42 -------------------------------------------------------------------------------- Vitals Details Patient Name: Date of Service: [name] Pont, [name] [name] 03/16/2023 7:30 A M Medical Record Number: [id number] Patient Account Number: [id number] Date of Birth/Sex: Treating RN: [birth year] (57 y.o. [name] Primary Care : [name] Other Clinician: Referring : Treating /Extender: Donnetta Hail in Treatment: 11 Vital Signs Time Taken: 08:00 Temperature (F): 97.4 Height (in): 68 Pulse (bpm): 76 Weight (lbs): 200 Respiratory Rate (breaths/min): 18 Body Mass Index (BMI): 30.4 Blood Pressure (mmHg): 129/79 Capillary Blood Glucose (mg/dl): 789 Reference Range: 80 - 120 mg / dl Electronic Signature(s) Signed: 03/16/2023 3:41:29 PM By: Zenaida Deed RN, BSN Entered By: Zenaida Deed on 03/16/2023 11:27:37

## 2023-03-16 NOTE — Progress Notes (Signed)
Office Visit Note   Patient: [name]           Date of Birth: 08/01/1966           MRN: [id number] Visit Date: 03/16/2023              Requested by: Garlan Fillers, MD [address] [place],  [place] [address] PCP: Garlan Fillers, MD  Chief Complaint  Patient presents with   Left Achilles Tendon - Routine Post Op    03/04/2023 left achilles debridement       HPI: Patient is a 57 year old gentleman who is 2 weeks status post debridement left Achilles ulcer.  Patient states he is still going to hyperbaric therapy.  He states he has nightmares about losing his foot.  Patient has completed a course of amoxicillin and Bactrim DS the bacteria was also sensitive to Levaquin.  Assessment & Plan: Visit Diagnoses:  1. 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  Follow-Up Instructions: Return in about 1 week (around 03/23/2023).   Ortho Exam  Patient is alert, oriented, no adenopathy, well-dressed, normal affect, normal respiratory effort. Examination the wound bed has healthy granulation tissue.  Measures 2 x 2.5 cm and is 2 mm deep.  There is no surrounding cellulitis.  Donated Kerecis micro graft was applied.  Imaging: No results found.   Labs: Lab Results  Component Value Date   HGBA1C 7.4 (H) 04/25/2014   HGBA1C 7.4 (H) 04/25/2014   ESRSEDRATE 9 09/26/2019   ESRSEDRATE 12 09/09/2017   ESRSEDRATE 58 (H) 06/01/2014   REPTSTATUS 03/09/2023 FINAL 03/04/2023   GRAMSTAIN NO WBC SEEN NO ORGANISMS SEEN  03/04/2023   CULT  03/04/2023    RARE STENOTROPHOMONAS MALTOPHILIA NO ANAEROBES ISOLATED Performed at [facility], [address], [place], [place] [address]    LABORGA STENOTROPHOMONAS MALTOPHILIA 03/04/2023     Lab Results  Component Value Date   ALBUMIN 3.7 03/15/2018   ALBUMIN 3.7  09/09/2017   ALBUMIN 4.2 03/30/2017    Lab Results  Component Value Date   MG 2.3 04/28/2014   MG 2.4 04/28/2014   MG 2.7 (H) 04/27/2014   Lab Results  Component Value Date   VD25OH 57 06/22/2019    No results found for: "PREALBUMIN"    Latest Ref Rng & Units 03/04/2023    7:00 AM 01/15/2023    1:35 PM 04/01/2021    3:37 PM  CBC EXTENDED  WBC 4.0 - 10.5 K/uL 11.4     RBC 4.22 - 5.81 MIL/uL 3.74     Hemoglobin 13.0 - 17.0 g/dL 78.4  69.6  29.5   HCT 39.0 - 52.0 % 35.6  38.0  33.9   Platelets 150 - 400 K/uL 267        There is no height or weight on file to calculate BMI.  Orders:  No orders of the defined types were placed in this encounter.  Meds ordered this encounter  Medications   levofloxacin (LEVAQUIN) 750 MG tablet    Sig: Take 1 tablet (750 mg total) by mouth daily.    Dispense:  20 tablet    Refill:  0     Procedures: No procedures performed  Clinical Data: No additional findings.  ROS:  All other systems negative, except as noted in  the HPI. Review of Systems  Objective: Vital Signs: There were no vitals taken for this visit.  Specialty Comments:  No specialty comments available.  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 03/22/2020   Osteopenia of multiple sites 02/28/2020   PVD (peripheral vascular disease) (HCC) 10/11/2019   Chronic venous insufficiency 10/05/2018   Diabetic cataract (HCC) 05/21/2018   Controlled type 1 diabetes mellitus with diabetic peripheral angiopathy without gangrene (HCC) 08/14/2017   Dyslipidemia 03/26/2017   High risk medication use 09/23/2016   History of hypertension 09/23/2016   History of chronic kidney disease 09/23/2016   History of coronary artery disease 09/23/2016    Tobacco abuse 09/23/2016   Primary osteoarthritis of both knees 09/23/2016   History of diabetes mellitus 09/23/2016   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 (HCC) 11/20/2015   Chest pain with high risk for cardiac etiology 10/29/2015   Asymptomatic bilateral carotid artery stenosis 06/08/2014   Pleural effusion, bilateral 06/03/2014   Dyspnea 06/01/2014   Diabetes (HCC) 05/03/2014   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/03/2014   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   Chronic renal disease, stage 3, moderately decreased glomerular filtration rate between 30-59 mL/min/1.73 square meter (HCC) 05/03/2014   CAD (coronary artery disease) 04/27/2014   Acne 12/19/2013   On isotretinoin therapy 12/19/2013   Nuclear sclerotic cataract, bilateral 12/19/2011   Vitreous hemorrhage (HCC) 06/16/2011   Past Medical History:  Diagnosis Date   Anginal pain (HCC)    Anxiety    Arthritis    RA IN HANDS   Asthma    CAD (coronary artery disease) 04/27/2014   Chronic renal disease, stage 3, moderately decreased glomerular filtration rate between 30-59 mL/min/1.73 square meter (HCC) 05/03/2014   COPD (chronic obstructive pulmonary disease) (HCC)    Coronary artery disease    Diabetes (HCC) 05/03/2014   Diabetes mellitus without complication (HCC)    type 1   Hyperlipidemia    Left shoulder pain    Peripheral vascular disease (HCC)    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/03/2014   Shortness of breath    Sleep apnea    mild OSA, no CPAP   Unstable angina pectoris (HCC) 04/25/2014    Family History  Problem Relation Age of Onset   Stomach cancer Mother    Cancer Mother    Prostate cancer Father    Heart disease Father    Cancer - Prostate Father    Heart disease Brother    Asthma Daughter    Healthy Daughter    Asthma Daughter     Past Surgical History:  Procedure  Laterality Date   ABDOMINAL AORTOGRAM W/LOWER EXTREMITY N/A 01/15/2023   Procedure: ABDOMINAL AORTOGRAM W/LOWER EXTREMITY;  Surgeon: Cephus Shelling, MD;  Location: [facility] INVASIVE CV LAB;  Service: Cardiovascular;  Laterality: N/A;   CARDIAC CATHETERIZATION  04/25/2014   BY DR Jacinto Halim   CARDIAC CATHETERIZATION N/A 10/30/2015   Procedure: Left Heart Cath and Cors/Grafts Angiography;  Surgeon: Yates Decamp, MD;  Location: [facility] INVASIVE CV LAB;  Service: Cardiovascular;  Laterality: N/A;   CARPAL TUNNEL RELEASE     CATARACT EXTRACTION, BILATERAL     CORONARY ARTERY BYPASS GRAFT  N/A 04/27/2014   Procedure: CORONARY ARTERY BYPASS GRAFTING on pump using left internal mammary artery to LAD coronary artery, right great saphenous vein graft to diagonal coronary artery with sequential to OM1 and circumflex coronary arteries. Right greater saphenous vein graft to posterior descending coronary artery. ;  Surgeon: Delight Ovens, MD;  Location: [facility] OR;  Service: Open Heart Surgery;  Laterality: N   elbow drained Left 05/09/2019   ENDOVEIN HARVEST OF GREATER SAPHENOUS VEIN Right 04/27/2014   Procedure: ENDOVEIN HARVEST OF GREATER SAPHENOUS VEIN;  Surgeon: Delight Ovens, MD;  Location: [facility] OR;  Service: Open Heart Surgery;  Laterality: Right;   EXCISION ORAL TUMOR N/A 03/01/2018   Procedure: EXCISION ORAL TUMOR;  Surgeon: Christia Reading, MD;  Location: [facility];  Service: ENT;  Laterality: N/A;   EYE SURGERY     LASER   EYE SURGERY Bilateral    astigmatism correction    HARDWARE REMOVAL Left 10/03/2020   Procedure: LEFT FOOT REMOVAL HARDWARE, PLACE VANC POWDER;  Surgeon: Nadara Mustard, MD;  Location: [facility] OR;  Service: Orthopedics;  Laterality: Left;   I & D EXTREMITY Left 03/04/2023   Procedure: LEFT ACHILLES DEBRIDEMENT;  Surgeon: Nadara Mustard, MD;  Location: [facility] OR;  Service: Orthopedics;  Laterality: Left;   INTRAOPERATIVE TRANSESOPHAGEAL ECHOCARDIOGRAM N/A 04/27/2014   Procedure: INTRAOPERATIVE  TRANSESOPHAGEAL ECHOCARDIOGRAM;  Surgeon: Delight Ovens, MD;  Location: [facility] OR;  Service: Open Heart Surgery;  Laterality: N/A;   IR RADIOLOGIST EVAL & MGMT  12/22/2022   LEFT HEART CATHETERIZATION WITH CORONARY ANGIOGRAM N/A 04/25/2014   Procedure: LEFT HEART CATHETERIZATION WITH CORONARY ANGIOGRAM;  Surgeon: Pamella Pert, MD;  Location: [facility] CATH LAB;  Service: Cardiovascular;  Laterality: N/A;   MOUTH SURGERY  11/15/2017   tongue surgery    ORIF TOE FRACTURE Left 03/22/2020   Procedure: OPEN REDUCTION INTERNAL FIXATION (ORIF) Non Union 5th Metatarsal;  Surgeon: Kathryne Hitch, MD;  Location: [facility];  Service: Orthopedics;  Laterality: Left;   Social History   Occupational History   Not on file  Tobacco Use   Smoking status: Former    Current packs/day: 0.25    Average packs/day: 0.3 packs/day for 34.0 years (8.5 ttl pk-yrs)    Types: Cigarettes   Smokeless tobacco: Never  Vaping Use   Vaping status: Never Used  Substance and Sexual Activity   Alcohol use: Not Currently    Comment: RARE   Drug use: No   Sexual activity: Not on file

## 2023-03-16 NOTE — Progress Notes (Signed)
LANXTON, TUMLINSON ([id number]) [id number] Page 1 of 2 Visit Report for 03/16/2023 HBO Details Patient Name: Date of Service: [place], [name] 03/16/2023 7:30 A M Medical Record Number: [id number] Patient Account Number: [id number] Date of Birth/Sex: Treating RN: 04/20/1966 (57 y.o. [name] Primary Care : [name] Other Clinician: Referring : Treating /Extender: Donnetta Hail in Treatment: 11 HBO Treatment Course Details Treatment Course Number: 1 Ordering : Geralyn Corwin T Treatments Ordered: otal 40 HBO Treatment Start Date: 02/16/2023 HBO Indication: Other (specify in Notes) Notes: Acute peripheral arterial insufficiency HBO Treatment Details Treatment Number: 17 Patient Type: Outpatient Chamber Type: Monoplace Chamber Serial #: [id number] Treatment Protocol: 2.0 ATA with 90 minutes oxygen, with two 5 minute air breaks Treatment Details Compression Rate Down: 2.0 psi / minute De-Compression Rate Up: 2.0 psi / minute A breaks and breathing ir Compress Tx Pressure periods Decompress Decompress Begins Reached (leave unused spaces Begins Ends blank) Chamber Pressure (ATA 1 2 2 2 2 2  --2 1 ) Clock Time (24 hr) 08:20 08:31 09:01 09:06 09:36 09:47 - - 10:12 10:19 Treatment Length: 119 (minutes) Treatment Segments: 4 Vital Signs Capillary Blood Glucose Reference Range: 80 - 120 mg / dl HBO Diabetic Blood Glucose Intervention Range: <131 mg/dl or >366 mg/dl Time Vitals Blood Respiratory Capillary Blood Glucose Pulse Action Type: Pulse: Temperature: Taken: Pressure: Rate: Glucose (mg/dl): Meter #: Oximetry (%) Taken: Pre 08:00 129/79 76 18 97.4 175 Post 10:25 102/89 71 18 97.4 225 Treatment Response Treatment Toleration: Well Treatment Completion Status: Treatment Completed without Adverse Event Physician HBO Attestation: I certify that I supervised this HBO treatment in  accordance with Medicare guidelines. A trained emergency response team is readily available per Yes hospital policies and procedures. Continue HBOT as ordered. Yes Electronic Signature(s) Signed: 03/16/2023 4:52:09 PM By: Geralyn Corwin DO Previous Signature: 03/16/2023 3:41:29 PM Version By: Zenaida Deed RN, BSN Entered By: Geralyn Corwin on 03/16/2023 16:49:57 [name] ([id number]) [id number] Page 2 of 2 -------------------------------------------------------------------------------- HBO Safety Checklist Details Patient Name: Date of Service: [place], [name] 03/16/2023 7:30 A M Medical Record Number: [id number] Patient Account Number: [id number] Date of Birth/Sex: Treating RN: 1965-09-13 (57 y.o. [name] Primary Care : [name] Other Clinician: Referring : Treating /Extender: Donnetta Hail in Treatment: 11 HBO Safety Checklist Items Safety Checklist Consent Form Signed Patient voided / foley secured and emptied When did you last eato 0700 Last dose of injectable or oral agent 0630 17 units lantus Ostomy pouch emptied and vented if applicable NA All implantable devices assessed, documented and approved NA Intravenous access site secured and place NA Valuables secured Linens and cotton and cotton/polyester blend (less than 51% polyester) Personal oil-based products / skin lotions / body lotions removed Wigs or hairpieces removed Smoking or tobacco materials removed Books / newspapers / magazines / loose paper removed Cologne, aftershave, perfume and deodorant removed Jewelry removed (may wrap wedding band) Make-up removed Hair care products removed Battery operated devices (external) removed NA Heating patches and chemical warmers removed Titanium eyewear removed Nail polish cured greater than 10 hours Casting material cured greater than 10 hours NA Hearing aids  removed Loose dentures or partials removed Prosthetics have been removed NA Patient demonstrates correct use of air break device (if applicable) Patient concerns have been addressed Patient grounding bracelet on and cord attached to chamber Specifics for Inpatients (complete in addition to above) Medication sheet sent with patient NA Intravenous medications needed or due  during therapy sent with patient NA Drainage tubes (e.g. nasogastric tube or chest tube secured and vented) NA Endotracheal or Tracheotomy tube secured NA Cuff deflated of air and inflated with saline NA Airway suctioned NA Electronic Signature(s) Signed: 03/16/2023 3:41:29 PM By: Zenaida Deed RN, BSN Entered By: Zenaida Deed on 03/16/2023 11:29:40

## 2023-03-16 NOTE — Progress Notes (Signed)
CAMIL, TREMBATH ([id number]) [id number] Page 1 of 1 Visit Report for 03/16/2023 SuperBill Details Patient Name: Date of Service: [name], [name] 03/16/2023 Medical Record Number: [id number] Patient Account Number: [id number] Date of Birth/Sex: Treating RN: 1966/03/22 (57 y.o. [name] Primary Care Provider: [name] Other Clinician: Referring Provider: Treating Provider/Extender: Donnetta Hail in Treatment: 11 Diagnosis Coding ICD-10 Codes Code Description [phone] Unspecified open wound, left foot, initial encounter 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 J44.9 Chronic obstructive pulmonary disease, unspecified M06.9 Rheumatoid arthritis, unspecified Z87.891 Personal history of nicotine dependence Facility Procedures CPT4 Code Description Modifier Quantity [id number] G0277-(Facility Use Only) HBOT full body chamber, , 4 ICD-10 Diagnosis Description 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 S91.302A Unspecified open wound, left foot, initial encounter 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 Physician Procedures Quantity CPT4 Code Description Modifier [id number] - WC PHYS HYPERBARIC OXYGEN THERAPY 1 ICD-10 Diagnosis Description 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 S91.302A Unspecified open wound, left foot, initial encounter 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 Electronic Signature(s) Signed:  03/16/2023 3:41:29 PM By: Zenaida Deed RN, BSN Signed: 03/16/2023 4:52:09 PM By: Geralyn Corwin DO Entered By: Zenaida Deed on 03/16/2023 12:18:04

## 2023-03-17 ENCOUNTER — Ambulatory Visit (HOSPITAL_BASED_OUTPATIENT_CLINIC_OR_DEPARTMENT_OTHER): Payer: 59 | Admitting: Internal Medicine

## 2023-03-17 ENCOUNTER — Encounter (HOSPITAL_BASED_OUTPATIENT_CLINIC_OR_DEPARTMENT_OTHER): Payer: 59 | Admitting: Internal Medicine

## 2023-03-17 DIAGNOSIS — I70245 Atherosclerosis of native arteries of left leg with ulceration of other part of foot: Secondary | ICD-10-CM

## 2023-03-17 DIAGNOSIS — L97528 Non-pressure chronic ulcer of other part of left foot with other specified severity: Secondary | ICD-10-CM | POA: Diagnosis not present

## 2023-03-17 DIAGNOSIS — S91302A Unspecified open wound, left foot, initial encounter: Secondary | ICD-10-CM | POA: Diagnosis not present

## 2023-03-17 LAB — GLUCOSE, CAPILLARY
Glucose-Capillary: 114 mg/dL — ABNORMAL HIGH (ref 70–99)
Glucose-Capillary: 136 mg/dL — ABNORMAL HIGH (ref 70–99)
Glucose-Capillary: 216 mg/dL — ABNORMAL HIGH (ref 70–99)

## 2023-03-18 ENCOUNTER — Encounter (HOSPITAL_BASED_OUTPATIENT_CLINIC_OR_DEPARTMENT_OTHER): Payer: 59 | Admitting: Physician Assistant

## 2023-03-18 DIAGNOSIS — S91302A Unspecified open wound, left foot, initial encounter: Secondary | ICD-10-CM | POA: Diagnosis not present

## 2023-03-18 LAB — GLUCOSE, CAPILLARY
Glucose-Capillary: 180 mg/dL — ABNORMAL HIGH (ref 70–99)
Glucose-Capillary: 219 mg/dL — ABNORMAL HIGH (ref 70–99)

## 2023-03-19 ENCOUNTER — Encounter (HOSPITAL_BASED_OUTPATIENT_CLINIC_OR_DEPARTMENT_OTHER): Payer: 59 | Admitting: Internal Medicine

## 2023-03-19 DIAGNOSIS — E10621 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: Secondary | ICD-10-CM

## 2023-03-19 DIAGNOSIS — S91302A Unspecified open wound, left foot, initial encounter: Secondary | ICD-10-CM | POA: Diagnosis not present

## 2023-03-19 DIAGNOSIS — I70245 Atherosclerosis of native arteries of left leg with ulceration of other part of foot: Secondary | ICD-10-CM

## 2023-03-19 DIAGNOSIS — L97528 Non-pressure chronic ulcer of other part of left foot with other specified severity: Secondary | ICD-10-CM | POA: Diagnosis not present

## 2023-03-19 LAB — GLUCOSE, CAPILLARY
Glucose-Capillary: 177 mg/dL — ABNORMAL HIGH (ref 70–99)
Glucose-Capillary: 233 mg/dL — ABNORMAL HIGH (ref 70–99)

## 2023-03-19 NOTE — Progress Notes (Signed)
CLINTIN, VANDYKEN ([id number]) [id number] Page 1 of 2 Visit Report for 03/17/2023 Arrival Information Details Patient Name: Date of Service: [place], [name] 03/17/2023 7:30 A M Medical Record Number: [id number] Patient Account Number: [id number] Date of Birth/Sex: Treating RN: 12/23/1965 (57 y.o. [name], [name] Primary Care : [name] Other Clinician: Referring : Treating /Extender: Donnetta Hail in Treatment: 11 Visit Information History Since Last Visit Added or deleted any medications: No Patient Arrived: Ambulatory Any new allergies or adverse reactions: No Arrival Time: 07:40 Had a fall or experienced change in No Accompanied By: wife activities of daily living that may affect Transfer Assistance: None risk of falls: Patient Identification Verified: Yes Signs or symptoms of abuse/neglect since last visito No Secondary Verification Process Completed: Yes Hospitalized since last visit: No Patient Requires Transmission-Based Precautions: No Implantable device outside of the clinic excluding No Patient Has Alerts: No cellular tissue based products placed in the center since last visit: Has Dressing in Place as Prescribed: Yes Pain Present Now: No Electronic Signature(s) Signed: 03/18/2023 6:05:41 PM By: Shawn Stall RN, BSN Entered By: Shawn Stall on 03/17/2023 08:28:24 -------------------------------------------------------------------------------- Encounter Discharge Information Details Patient Name: Date of Service: [name], [name] 03/17/2023 7:30 A M Medical Record Number: [id number] Patient Account Number: [id number] Date of Birth/Sex: Treating RN: 1965-09-03 (57 y.o. [name] Primary Care : [name] Other Clinician: Referring : Treating /Extender: Donnetta Hail in Treatment: 11 Encounter Discharge Information  Items Discharge Condition: Stable Ambulatory Status: Ambulatory Discharge Destination: Home Transportation: Private Auto Accompanied By: self Schedule Follow-up Appointment: Yes Clinical Summary of Care: Electronic Signature(s) Signed: 03/18/2023 6:05:41 PM By: Shawn Stall RN, BSN Entered By: Shawn Stall on 03/17/2023 10:39:14 [name] ([id number]) [id number] Page 2 of 2 -------------------------------------------------------------------------------- Vitals Details Patient Name: Date of Service: [place], [name] 03/17/2023 7:30 A M Medical Record Number: [id number] Patient Account Number: [id number] Date of Birth/Sex: Treating RN: 1966-05-17 (58 y.o. [name] Primary Care : [name] Other Clinician: Referring : Treating /Extender: Donnetta Hail in Treatment: 11 Vital Signs Time Taken: 07:40 Temperature (F): 97.2 Height (in): 68 Pulse (bpm): 80 Weight (lbs): 200 Respiratory Rate (breaths/min): 16 Body Mass Index (BMI): 30.4 Blood Pressure (mmHg): 146/79 Capillary Blood Glucose (mg/dl): 737 Reference Range: 80 - 120 mg / dl Notes [id number]-[id number] given. rechecked BG at 0807- 136. Electronic Signature(s) Signed: 03/18/2023 6:05:41 PM By: Shawn Stall RN, BSN Entered By: Shawn Stall on 03/17/2023 08:29:17

## 2023-03-19 NOTE — Progress Notes (Signed)
[name], [name] ([id number]) [id number] Page 1 of 2 Visit Report for 03/18/2023 HBO Details Patient Name: Date of Service: [place], [name] 03/18/2023 7:30 A M Medical Record Number: [id number] Patient Account Number: [id number] Date of Birth/Sex: Treating RN: November 13, 1965 (57 y.o. [name] Primary Care : [name] Other Clinician: Referring : Treating /Extender: [name] in Treatment: 11 HBO Treatment Course Details Treatment Course Number: 1 Ordering : Geralyn Corwin T Treatments Ordered: otal 40 HBO Treatment Start Date: 02/16/2023 HBO Indication: Other (specify in Notes) Notes: Acute peripheral arterial insufficiency HBO Treatment Details Treatment Number: 19 Patient Type: Outpatient Chamber Type: Monoplace Chamber Serial #: [id number] Treatment Protocol: 2.0 ATA with 90 minutes oxygen, with two 5 minute air breaks Treatment Details Compression Rate Down: 2.0 psi / minute De-Compression Rate Up: 2.0 psi / minute A breaks and breathing ir Compress Tx Pressure periods Decompress Decompress Begins Reached (leave unused spaces Begins Ends blank) Chamber Pressure (ATA 1 2 2 2 2 2  --2 1 ) Clock Time (24 hr) 08:27 08:37 09:07 09:12 09:42 09:47 - - 10:17 10:27 Treatment Length: 120 (minutes) Treatment Segments: 4 Vital Signs Capillary Blood Glucose Reference Range: 80 - 120 mg / dl HBO Diabetic Blood Glucose Intervention Range: <131 mg/dl or >270 mg/dl Time Vitals Blood Respiratory Capillary Blood Glucose Pulse Action Type: Pulse: Temperature: Taken: Pressure: Rate: Glucose (mg/dl): Meter #: Oximetry (%) Taken: Pre 08:20 131/63 20 20 97.5 180 Post 10:32 121/63 73 18 98.2 219 Treatment Response Treatment Toleration: Well Treatment Completion Status: Treatment Completed without Adverse Event Electronic Signature(s) Signed: 03/18/2023 5:04:24 PM By: Allen Derry  PA-C Signed: 03/18/2023 5:11:24 PM By: Zenaida Deed RN, BSN Entered By: Zenaida Deed on 03/18/2023 11:17:28 -------------------------------------------------------------------------------- HBO Safety Checklist Details Patient Name: Date of Service: [name], [name] [name] 03/18/2023 7:30 A M Medical Record Number: [id number] Patient Account Number: [id number] Date of Birth/Sex: Treating RN: 1966/01/20 (57 y.o. [name] Primary Care : [name] Other Clinician: Referring : Treating /Extender: [name], [name] ([id number]) [id number] Page 2 of 2 Weeks in Treatment: 11 HBO Safety Checklist Items Safety Checklist Consent Form Signed Patient voided / foley secured and emptied When did you last eato 0730 Last dose of injectable or oral agent 0715, 17 units lantus Ostomy pouch emptied and vented if applicable NA All implantable devices assessed, documented and approved Intravenous access site secured and place NA Valuables secured Linens and cotton and cotton/polyester blend (less than 51% polyester) Personal oil-based products / skin lotions / body lotions removed Wigs or hairpieces removed Smoking or tobacco materials removed Books / newspapers / magazines / loose paper removed Cologne, aftershave, perfume and deodorant removed Jewelry removed (may wrap wedding band) Make-up removed Hair care products removed Battery operated devices (external) removed NA Heating patches and chemical warmers removed Titanium eyewear removed Nail polish cured greater than 10 hours Casting material cured greater than 10 hours NA Hearing aids removed NA Loose dentures or partials removed NA Prosthetics have been removed NA Patient demonstrates correct use of air break device (if applicable) Patient concerns have been addressed Patient grounding bracelet on and cord attached to chamber Specifics  for Inpatients (complete in addition to above) Medication sheet sent with patient NA Intravenous medications needed or due during therapy sent with patient NA Drainage tubes (e.g. nasogastric tube or chest tube secured and vented) NA Endotracheal or Tracheotomy tube secured NA Cuff deflated of air and inflated with saline NA Airway  suctioned NA Electronic Signature(s) Signed: 03/18/2023 5:11:24 PM By: Zenaida Deed RN, BSN Entered By: Zenaida Deed on 03/18/2023 08:33:35

## 2023-03-19 NOTE — Progress Notes (Addendum)
NAKAI, DEADMON ([id number]) [id number] Page 1 of 2 Visit Report for 03/19/2023 Arrival Information Details Patient Name: Date of Service: [place], [name] 03/19/2023 7:30 A M Medical Record Number: [id number] Patient Account Number: [id number] Date of Birth/Sex: Treating RN: [birth year] (57 y.o. [name], [name] Primary Care : [name] Other Clinician: Referring : Treating /Extender: Donnetta Hail in Treatment: 12 Visit Information History Since Last Visit Added or deleted any medications: No Patient Arrived: Ambulatory Any new allergies or adverse reactions: No Arrival Time: 07:48 Had a fall or experienced change in No Accompanied By: self activities of daily living that may affect Transfer Assistance: None risk of falls: Patient Identification Verified: Yes Signs or symptoms of abuse/neglect since last visito No Secondary Verification Process Completed: Yes Hospitalized since last visit: No Patient Requires Transmission-Based Precautions: No Implantable device outside of the clinic excluding No Patient Has Alerts: No cellular tissue based products placed in the center since last visit: Has Dressing in Place as Prescribed: Yes Pain Present Now: No Electronic Signature(s) Signed: 03/19/2023 9:21:44 AM By: Fonnie Mu RN Entered By: Fonnie Mu on 03/19/2023 09:21:44 -------------------------------------------------------------------------------- Encounter Discharge Information Details Patient Name: Date of Service: [name], [name] 03/19/2023 7:30 A M Medical Record Number: [id number] Patient Account Number: [id number] Date of Birth/Sex: Treating RN: 03/07/1966 (57 y.o. [name], [name] Primary Care : [name] Other Clinician: Referring : Treating /Extender: Donnetta Hail in Treatment: 12 Encounter Discharge  Information Items Discharge Condition: Stable Ambulatory Status: Ambulatory Discharge Destination: Home Transportation: Private Auto Accompanied By: self Schedule Follow-up Appointment: Yes Clinical Summary of Care: Patient Declined Electronic Signature(s) Signed: 03/19/2023 10:31:36 AM By: Fonnie Mu RN Entered By: Fonnie Mu on 03/19/2023 10:31:36 [name] ([id number]) [id number] Page 2 of 2 -------------------------------------------------------------------------------- Patient/Caregiver Education Details Patient Name: Date of Service: [place], [name] 8/15/2024andnbsp7:30 A M Medical Record Number: [id number] Patient Account Number: [id number] Date of Birth/Gender: Treating RN: 17-Jul-1966 (57 y.o. Lucious Groves Primary Care Physician: [name] Other Clinician: Referring Physician: Treating Physician/Extender: Donnetta Hail in Treatment: 12 Education Assessment Education Provided To: Patient Education Topics Provided Hyperbaric Oxygenation: Methods: Explain/Verbal Responses: State content correctly Electronic Signature(s) Signed: 03/19/2023 3:18:41 PM By: Fonnie Mu RN Entered By: Fonnie Mu on 03/19/2023 10:31:21 -------------------------------------------------------------------------------- Vitals Details Patient Name: Date of Service: [name], [name] 03/19/2023 7:30 A M Medical Record Number: [id number] Patient Account Number: [id number] Date of Birth/Sex: Treating RN: 21-Dec-1965 (57 y.o. [name], [name] Primary Care : [name] Other Clinician: Referring : Treating /Extender: Donnetta Hail in Treatment: 12 Vital Signs Time Taken: 07:48 Temperature (F): 97.7 Height (in): 68 Pulse (bpm): 73 Weight (lbs): 200 Respiratory Rate (breaths/min): 18 Body Mass Index (BMI): 30.4 Blood Pressure (mmHg):  119/60 Capillary Blood Glucose (mg/dl): 601 Reference Range: 80 - 120 mg / dl Electronic Signature(s) Signed: 03/19/2023 9:22:07 AM By: Fonnie Mu RN Entered By: Fonnie Mu on 03/19/2023 09:22:06

## 2023-03-19 NOTE — Progress Notes (Signed)
HRITHIK, GOERTZ ([id number]) [id number] Page 1 of 2 Visit Report for 03/17/2023 Problem List Details Patient Name: Date of Service: [name], [name] 03/17/2023 7:30 A M Medical Record Number: [id number] Patient Account Number: [id number] Date of Birth/Sex: Treating RN: 08/01/1966 (57 y.o. 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 12/25/2022 No Yes J44.9 Chronic obstructive pulmonary disease, unspecified 12/25/2022 No Yes M06.9 Rheumatoid arthritis, unspecified 12/25/2022 No Yes Z87.891 Personal history of nicotine dependence 01/27/2023 No Yes Inactive Problems Resolved Problems Electronic Signature(s) Signed: 03/17/2023 4:37:08 PM By: Geralyn Corwin DO Signed: 03/18/2023 6:05:41 PM By: Shawn Stall RN, BSN Entered By: Shawn Stall on 03/17/2023 10:38:31 -------------------------------------------------------------------------------- SuperBill Details Patient Name: Date of Service: [name], [place] [name] 03/17/2023 [name] ([id number]) [id number] Page 2 of 2 Medical Record Number: [id number] Patient Account Number: [id number] Date of Birth/Sex: Treating RN: 06-17-1966 (57 y.o. 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

## 2023-03-19 NOTE — Progress Notes (Signed)
[name], [name] ([id number]) [id number] Page 1 of 2 Visit Report for 03/18/2023 Problem List Details Patient Name: Date of Service: [name], [name] 03/18/2023 7:30 A M Medical Record Number: [id number] Patient Account Number: [id number] Date of Birth/Sex: Treating RN: [birth year] (57 y.o. 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 12/25/2022 No Yes J44.9 Chronic obstructive pulmonary disease, unspecified 12/25/2022 No Yes M06.9 Rheumatoid arthritis, unspecified 12/25/2022 No Yes Z87.891 Personal history of nicotine dependence 01/27/2023 No Yes Inactive Problems Resolved Problems Electronic Signature(s) Signed: 03/18/2023 5:04:24 PM By: Allen Derry PA-C Signed: 03/18/2023 5:11:24 PM By: Zenaida Deed RN, BSN Entered By: Zenaida Deed on 03/18/2023 11:18:18 -------------------------------------------------------------------------------- SuperBill Details Patient Name: Date of Service: [name], [place] [name] 03/18/2023 [name] ([id number]) [id number] Page 2 of 2 Medical Record Number: [id number] Patient Account Number: [id number] Date of Birth/Sex: Treating RN: [birth year] (57 y.o. 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 J44.9 Chronic obstructive pulmonary disease, unspecified M06.9 Rheumatoid arthritis, unspecified Z87.891 Personal history of nicotine dependence Facility Procedures : CPT4 Code Description: [id number] G0277-(Facility Use Only) HBOT full body chamber, , ICD-10 Diagnosis Description I70.245 Atherosclerosis of native arteries of left leg with ulceration of L97.528 Non-pressure chronic ulcer of other part of left foot  with other 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 Modifier: other part of f specified severi Quantity: 4 oot ty Physician Procedures : CPT4 Code Description Modifier [id number] [phone] - WC PHYS HYPERBARIC OXYGEN THERAPY ICD-10 Diagnosis Description I70.245 Atherosclerosis of native arteries of left leg with ulceration of other part of fo L97.528 Non-pressure chronic ulcer of other part of  left foot with other specified severit 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

## 2023-03-19 NOTE — Progress Notes (Signed)
[name], [name] ([id number]) [id number] Page 1 of 2 Visit Report for 03/17/2023 HBO Details Patient Name: Date of Service: [place], [name] 03/17/2023 7:30 A M Medical Record Number: [id number] Patient Account Number: [id number] Date of Birth/Sex: Treating RN: 10/21/1965 (57 y.o. [name], [name] Primary Care : [name] Other Clinician: Referring : Treating /Extender: Donnetta Hail in Treatment: 11 HBO Treatment Course Details Treatment Course Number: 1 Ordering : Geralyn Corwin T Treatments Ordered: otal 40 HBO Treatment Start Date: 02/16/2023 HBO Indication: Other (specify in Notes) Notes: Acute peripheral arterial insufficiency HBO Treatment Details Treatment Number: 18 Patient Type: Outpatient Chamber Type: Monoplace Chamber Serial #: [id number] Treatment Protocol: 2.0 ATA with 90 minutes oxygen, with two 5 minute air breaks Treatment Details Compression Rate Down: 2.0 psi / minute De-Compression Rate Up: A breaks and breathing ir Compress Tx Pressure periods Decompress Decompress Begins Reached (leave unused spaces Begins Ends blank) Chamber Pressure (ATA 1 2 2 2 2 2  --2 1 ) Clock Time (24 hr) 08:21 08:31 09:01 09:06 09:36 09:41 - - 10:13 10:19 Treatment Length: 118 (minutes) Treatment Segments: 4 Vital Signs Capillary Blood Glucose Reference Range: 80 - 120 mg / dl HBO Diabetic Blood Glucose Intervention Range: <131 mg/dl or >578 mg/dl Type: Time Vitals Blood Pulse: Respiratory Temperature: Capillary Blood Glucose Pulse Action Taken: Pressure: Rate: Glucose (mg/dl): Meter #: Oximetry (%) Taken: Pre 07:40 146/79 80 16 97.2 114 glucerna given; 0807 BG recheck 136 Post 10:20 123/72 75 16 97.7 216 Treatment Response Treatment Toleration: Well Treatment Completion Status: Treatment Completed without Adverse Event Physician HBO Attestation: I certify that I supervised this HBO  treatment in accordance with Medicare guidelines. A trained emergency response team is readily available per Yes hospital policies and procedures. Continue HBOT as ordered. Yes Electronic Signature(s) Signed: 03/17/2023 4:37:08 PM By: Geralyn Corwin DO Entered By: Geralyn Corwin on 03/17/2023 16:35:04 [name], [name] ([id number]) [id number] Page 2 of 2 -------------------------------------------------------------------------------- HBO Safety Checklist Details Patient Name: Date of Service: [place], [name] 03/17/2023 7:30 A M Medical Record Number: [id number] Patient Account Number: [id number] Date of Birth/Sex: Treating RN: [birth year] (57 y.o. [name] Primary Care : [name] Other Clinician: Referring : Treating /Extender: Donnetta Hail in Treatment: 11 HBO Safety Checklist Items Safety Checklist Consent Form Signed Patient voided / foley secured and emptied When did you last eato 0700-egg sandwich, protein shake, coffee, OJ Last dose of injectable or oral agent 0630 Ostomy pouch emptied and vented if applicable NA All implantable devices assessed, documented and approved libre to left arm. Intravenous access site secured and place NA Valuables secured Linens and cotton and cotton/polyester blend (less than 51% polyester) Personal oil-based products / skin lotions / body lotions removed Wigs or hairpieces removed NA Smoking or tobacco materials removed NA Books / newspapers / magazines / loose paper removed Cologne, aftershave, perfume and deodorant removed Jewelry removed (may wrap wedding band) Make-up removed NA Hair care products removed NA Battery operated devices (external) removed NA Heating patches and chemical warmers removed NA Titanium eyewear removed NA Nail polish cured greater than 10 hours NA Casting material cured greater than 10 hours NA Hearing aids  removed NA Loose dentures or partials removed NA Prosthetics have been removed NA Patient demonstrates correct use of air break device (if applicable) Patient concerns have been addressed Patient grounding bracelet on and cord attached to chamber Specifics for Inpatients (complete in addition to above) Medication sheet sent with patient Intravenous  medications needed or due during therapy sent with patient Drainage tubes (e.g. nasogastric tube or chest tube secured and vented) Endotracheal or Tracheotomy tube secured Cuff deflated of air and inflated with saline Airway suctioned Electronic Signature(s) Signed: 03/18/2023 6:05:41 PM By: Shawn Stall RN, BSN Entered By: Shawn Stall on 03/17/2023 08:30:52

## 2023-03-19 NOTE — Progress Notes (Signed)
AKRAM, MOBBS ([id number]) [id number] Page 1 of 2 Visit Report for 03/18/2023 Arrival Information Details Patient Name: Date of Service: [place], [name] 03/18/2023 7:30 A M Medical Record Number: [id number] Patient Account Number: [id number] Date of Birth/Sex: Treating RN: 1966-06-16 (57 y.o. [name] Primary Care : [name] Other Clinician: Referring : Treating /Extender: [name] in Treatment: 11 Visit Information History Since Last Visit Has Dressing in Place as Prescribed: Yes Patient Arrived: Ambulatory Pain Present Now: No Arrival Time: 08:30 Accompanied By: self Transfer Assistance: None Patient Identification Verified: Yes Secondary Verification Process Completed: Yes Patient Requires Transmission-Based Precautions: No Patient Has Alerts: No Electronic Signature(s) Signed: 03/18/2023 5:11:24 PM By: Zenaida Deed RN, BSN Entered By: Zenaida Deed on 03/18/2023 08:30:39 -------------------------------------------------------------------------------- Encounter Discharge Information Details Patient Name: Date of Service: [name], [name] 03/18/2023 7:30 A M Medical Record Number: [id number] Patient Account Number: [id number] Date of Birth/Sex: Treating RN: 1966/06/21 (57 y.o. [name] Primary Care : [name] Other Clinician: Referring : Treating /Extender: [name] in Treatment: 11 Encounter Discharge Information Items Discharge Condition: Stable Ambulatory Status: Ambulatory Discharge Destination: Home Transportation: Private Auto Accompanied By: self Schedule Follow-up Appointment: Yes Clinical Summary of Care: Patient Declined Electronic Signature(s) Signed: 03/18/2023 5:11:24 PM By: Zenaida Deed RN, BSN Entered By: Zenaida Deed on 03/18/2023 11:20:48 Patient/Caregiver Education  Details -------------------------------------------------------------------------------- [name] ([id number]) [id number] Page 2 of 2 Patient Name: Date of Service: [place], [name] 8/14/2024andnbsp7:30 A M Medical Record Number: [id number] Patient Account Number: [id number] Date of Birth/Gender: Treating RN: 1966-04-16 (57 y.o. [name] Primary Care Physician: [name] Other Clinician: Referring Physician: Treating Physician/Extender: [name] in Treatment: 11 Education Assessment Education Provided To: Patient Education Topics Provided Elevated Blood Sugar/ Impact on Healing: Methods: Explain/Verbal Responses: Reinforcements needed, State content correctly Hyperbaric Oxygenation: Methods: Explain/Verbal Responses: Reinforcements needed, State content correctly Electronic Signature(s) Signed: 03/18/2023 5:11:24 PM By: Zenaida Deed RN, BSN Entered By: Zenaida Deed on 03/18/2023 11:20:26 -------------------------------------------------------------------------------- Vitals Details Patient Name: Date of Service: [name], [name] 03/18/2023 7:30 A M Medical Record Number: [id number] Patient Account Number: [id number] Date of Birth/Sex: Treating RN: 11/23/1965 (57 y.o. [name] Primary Care : [name] Other Clinician: Referring : Treating /Extender: [name] in Treatment: 11 Vital Signs Time Taken: 08:20 Temperature (F): 97.5 Height (in): 68 Pulse (bpm): 20 Weight (lbs): 200 Respiratory Rate (breaths/min): 20 Body Mass Index (BMI): 30.4 Blood Pressure (mmHg): 131/63 Capillary Blood Glucose (mg/dl): 235 Reference Range: 80 - 120 mg / dl Electronic Signature(s) Signed: 03/18/2023 5:11:24 PM By: Zenaida Deed RN, BSN Entered By: Zenaida Deed on 03/18/2023 08:31:54

## 2023-03-19 NOTE — Progress Notes (Addendum)
[name], [name] ([id number]) [id number] Page 1 of 2 Visit Report for 03/19/2023 HBO Details Patient Name: Date of Service: [place], [name] 03/19/2023 7:30 A M Medical Record Number: [id number] Patient Account Number: [id number] Date of Birth/Sex: Treating RN: 11-Mar-1966 (58 y.o. [name], [name] Primary Care [name]: [name] Other Clinician: Referring [name]: Treating [name]/Extender: [name] in Treatment: 12 HBO Treatment Course Details Treatment Course Number: 1 Ordering [name]: [name] T Treatments Ordered: otal 40 HBO Treatment Start Date: 02/16/2023 HBO Indication: Other (specify in Notes) Notes: Acute peripheral arterial insufficiency HBO Treatment Details Treatment Number: 20 Patient Type: Outpatient Chamber Type: Monoplace Chamber Serial #: [id number] Treatment Protocol: 2.0 ATA with 90 minutes oxygen, with two 5 minute air breaks Treatment Details Compression Rate Down: 2.0 psi / minute De-Compression Rate Up: 2.0 psi / minute A breaks and breathing ir Compress Tx Pressure periods Decompress Decompress Begins Reached (leave unused spaces Begins Ends blank) Chamber Pressure (ATA 1 2 2 2 2 2  --2 1 ) Clock Time (24 hr) 08:16 08:25 08:55 09:00 09:30 09:35 - - 10:05 10:15 Treatment Length: 119 (minutes) Treatment Segments: 4 Vital Signs Capillary Blood Glucose Reference Range: 80 - 120 mg / dl HBO Diabetic Blood Glucose Intervention Range: <131 mg/dl or >253 mg/dl Time Vitals Blood Respiratory Capillary Blood Glucose Pulse Action Type: Pulse: Temperature: Taken: Pressure: Rate: Glucose (mg/dl): Meter #: Oximetry (%) Taken: Pre 07:48 119/60 73 18 97.7 177 Post 10:15 123/60 68 17 97.5 233 Treatment Response Treatment Toleration: Well Treatment Completion Status: Treatment Completed without Adverse Event Physician HBO Attestation: I certify that I supervised this HBO treatment  in accordance with Medicare guidelines. A trained emergency response team is readily available per Yes hospital policies and procedures. Continue HBOT as ordered. Yes Electronic Signature(s) Signed: 03/19/2023 4:25:59 PM By: [name] Corwin DO Previous Signature: 03/19/2023 10:31:07 AM Version By: Fonnie Mu RN Previous Signature: 03/19/2023 10:29:43 AM Version By: Fonnie Mu RN Previous Signature: 03/19/2023 9:36:04 AM Version By: Fonnie Mu RN Entered By: [name] on 03/19/2023 16:15:14 [name] ([id number]) [id number] Page 2 of 2 -------------------------------------------------------------------------------- HBO Safety Checklist Details Patient Name: Date of Service: [name], [name] 03/19/2023 7:30 A M Medical Record Number: [id number] Patient Account Number: [id number] Date of Birth/Sex: Treating RN: 1966-04-15 (57 y.o. [name], [name] Primary Care [name]: [name] Other Clinician: Referring [name]: Treating [name]/Extender: [name] in Treatment: 12 HBO Safety Checklist Items Safety Checklist Consent Form Signed Patient voided / foley secured and emptied When did you last eato 0700; bacon egg and cheese biscuit Last dose of injectable or oral agent 0630-17 Units of Lantus Ostomy pouch emptied and vented if applicable NA All implantable devices assessed, documented and approved NA Intravenous access site secured and place NA Valuables secured Linens and cotton and cotton/polyester blend (less than 51% polyester) Personal oil-based products / skin lotions / body lotions removed NA Wigs or hairpieces removed NA Smoking or tobacco materials removed NA Books / newspapers / magazines / loose paper removed NA Cologne, aftershave, perfume and deodorant removed NA Jewelry removed (may wrap wedding band) NA Make-up removed NA Hair care products  removed NA Battery operated devices (external) removed NA Heating patches and chemical warmers removed NA Titanium eyewear removed NA Nail polish cured greater than 10 hours NA Casting material cured greater than 10 hours NA Hearing aids removed NA Loose dentures or partials removed NA Prosthetics have been removed NA Patient demonstrates correct use of air  break device (if applicable) Patient concerns have been addressed Patient grounding bracelet on and cord attached to chamber Specifics for Inpatients (complete in addition to above) Medication sheet sent with patient Intravenous medications needed or due during therapy sent with patient Drainage tubes (e.g. nasogastric tube or chest tube secured and vented) Endotracheal or Tracheotomy tube secured Cuff deflated of air and inflated with saline Airway suctioned Electronic Signature(s) Signed: 03/19/2023 9:23:10 AM By: Fonnie Mu RN Entered By: Fonnie Mu on 03/19/2023 09:23:10

## 2023-03-20 ENCOUNTER — Encounter (HOSPITAL_BASED_OUTPATIENT_CLINIC_OR_DEPARTMENT_OTHER): Payer: 59 | Admitting: Internal Medicine

## 2023-03-20 DIAGNOSIS — I70245 Atherosclerosis of native arteries of left leg with ulceration of other part of foot: Secondary | ICD-10-CM | POA: Diagnosis not present

## 2023-03-20 DIAGNOSIS — S91302A Unspecified open wound, left foot, initial encounter: Secondary | ICD-10-CM | POA: Diagnosis not present

## 2023-03-20 DIAGNOSIS — E10621 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: Secondary | ICD-10-CM | POA: Diagnosis not present

## 2023-03-20 DIAGNOSIS — L97528 Non-pressure chronic ulcer of other part of left foot with other specified severity: Secondary | ICD-10-CM

## 2023-03-20 LAB — GLUCOSE, CAPILLARY
Glucose-Capillary: 188 mg/dL — ABNORMAL HIGH (ref 70–99)
Glucose-Capillary: 260 mg/dL — ABNORMAL HIGH (ref 70–99)

## 2023-03-20 NOTE — Progress Notes (Signed)
[name], [name] ([id number]) [id number] Page 1 of 2 Visit Report for 03/20/2023 Arrival Information Details Patient Name: Date of Service: [name], [name] 03/20/2023 7:30 A M Medical Record Number: [id number] Patient Account Number: [id number] Date of Birth/Sex: Treating RN: Dec 30, 1965 (57 y.o. [name] Primary Care Acea Yagi: [name] Other Clinician: Referring Tailor Lucking: Treating Jasreet Dickie/Extender: Donnetta Hail in Treatment: 12 Visit Information History Since Last Visit Pain Present Now: No Patient Arrived: Ambulatory Arrival Time: 07:30 Accompanied By: spouse Transfer Assistance: None Patient Identification Verified: Yes Secondary Verification Process Completed: Yes Patient Requires Transmission-Based Precautions: No Patient Has Alerts: No Electronic Signature(s) Signed: 03/20/2023 12:41:36 PM By: Zenaida Deed RN, BSN Entered By: Zenaida Deed on 03/20/2023 09:15:16 -------------------------------------------------------------------------------- Encounter Discharge Information Details Patient Name: Date of Service: [name], [name]. 03/20/2023 7:30 A M Medical Record Number: [id number] Patient Account Number: [id number] Date of Birth/Sex: Treating RN: 05-Dec-1965 (57 y.o. [name] Primary Care Eivin Mascio: [name] Other Clinician: Referring Damacio Weisgerber: Treating Bindi Klomp/Extender: Donnetta Hail in Treatment: 12 Encounter Discharge Information Items Discharge Condition: Stable Ambulatory Status: Ambulatory Discharge Destination: Home Transportation: Private Auto Accompanied By: self Schedule Follow-up Appointment: Yes Clinical Summary of Care: Patient Declined Electronic Signature(s) Signed: 03/20/2023 12:41:36 PM By: Zenaida Deed RN, BSN Entered By: Zenaida Deed on 03/20/2023 10:50:25 Patient/Caregiver Education  Details -------------------------------------------------------------------------------- [name] ([id number]) [id number] Page 2 of 2 Patient Name: Date of Service: [name], [name] 8/16/2024andnbsp7:30 A M Medical Record Number: [id number] Patient Account Number: [id number] Date of Birth/Gender: Treating RN: 1966/04/05 (57 y.o. [name] Primary Care Physician: [name] Other Clinician: Referring Physician: Treating Physician/Extender: Donnetta Hail in Treatment: 12 Education Assessment Education Provided To: Patient Education Topics Provided Elevated Blood Sugar/ Impact on Healing: Methods: Explain/Verbal Responses: Reinforcements needed, State content correctly Hyperbaric Oxygenation: Methods: Explain/Verbal Responses: Reinforcements needed, State content correctly Electronic Signature(s) Signed: 03/20/2023 12:41:36 PM By: Zenaida Deed RN, BSN Entered By: Zenaida Deed on 03/20/2023 10:50:09 -------------------------------------------------------------------------------- Vitals Details Patient Name: Date of Service: [name], [name]. 03/20/2023 7:30 A M Medical Record Number: [id number] Patient Account Number: [id number] Date of Birth/Sex: Treating RN: 1965-09-12 (57 y.o. [name] Primary Care Phoebie Shad: [name] Other Clinician: Referring Saron Vanorman: Treating Maddox Hlavaty/Extender: Donnetta Hail in Treatment: 12 Vital Signs Time Taken: 07:34 Temperature (F): 97.6 Height (in): 68 Pulse (bpm): 72 Weight (lbs): 200 Respiratory Rate (breaths/min): 18 Body Mass Index (BMI): 30.4 Blood Pressure (mmHg): 121/54 Capillary Blood Glucose (mg/dl): 093 Reference Range: 80 - 120 mg / dl Electronic Signature(s) Signed: 03/20/2023 12:41:36 PM By: Zenaida Deed RN, BSN Entered By: Zenaida Deed on 03/20/2023 09:16:02

## 2023-03-20 NOTE — Progress Notes (Signed)
MORRELL, LAO ([id number]) [id number] Page 1 of 1 Visit Report for 03/19/2023 SuperBill Details Patient Name: Date of Service: [place], [name] 03/19/2023 Medical Record Number: [id number] Patient Account Number: [id number] Date of Birth/Sex: Treating RN: 07/15/1966 (57 y.o. 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 J44.9 Chronic obstructive pulmonary disease, unspecified M06.9 Rheumatoid arthritis, unspecified Z87.891 Personal history of nicotine dependence Facility Procedures CPT4 Code Description Modifier Quantity [id number] G0277-(Facility Use Only) HBOT full body chamber, , 4 Physician Procedures Quantity CPT4 Code Description Modifier [id number] [phone] - WC PHYS HYPERBARIC OXYGEN THERAPY 1 ICD-10 Diagnosis Description 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

## 2023-03-20 NOTE — Progress Notes (Signed)
[name], [name] ([id number]) [id number] Page 1 of 2 Visit Report for 03/20/2023 HBO Details Patient Name: Date of Service: [name], [name] 03/20/2023 7:30 A M Medical Record Number: [id number] Patient Account Number: [id number] Date of Birth/Sex: Treating RN: 10-27-1965 (57 y.o. [name] Primary Care Rondi Ivy: [name] Other Clinician: Referring Latha Staunton: Treating Kasper Mudrick/Extender: Donnetta Hail in Treatment: 12 HBO Treatment Course Details Treatment Course Number: 1 Ordering Duffy Dantonio: Geralyn Corwin T Treatments Ordered: otal 40 HBO Treatment Start Date: 02/16/2023 HBO Indication: Other (specify in Notes) Notes: Acute peripheral arterial insufficiency HBO Treatment Details Treatment Number: 21 Patient Type: Outpatient Chamber Type: Monoplace Chamber Serial #: [id number] Treatment Protocol: 2.0 ATA with 90 minutes oxygen, with two 5 minute air breaks Treatment Details Compression Rate Down: 2.0 psi / minute De-Compression Rate Up: 2.0 psi / minute A breaks and breathing ir Compress Tx Pressure periods Decompress Decompress Begins Reached (leave unused spaces Begins Ends blank) Chamber Pressure (ATA 1 2 2 2 2 2  --2 1 ) Clock Time (24 hr) 07:54 08:05 08:35 08:40 09:11 09:16 - - 09:47 09:54 Treatment Length: 120 (minutes) Treatment Segments: 4 Vital Signs Capillary Blood Glucose Reference Range: 80 - 120 mg / dl HBO Diabetic Blood Glucose Intervention Range: <131 mg/dl or >782 mg/dl Time Vitals Blood Respiratory Capillary Blood Glucose Pulse Action Type: Pulse: Temperature: Taken: Pressure: Rate: Glucose (mg/dl): Meter #: Oximetry (%) Taken: Pre 07:34 121/54 72 18 97.6 188 Post 10:00 130/59 70 18 97.2 260 Treatment Response Treatment Toleration: Well Treatment Completion Status: Treatment Completed without Adverse Event Physician HBO Attestation: I certify that I supervised this HBO treatment in  accordance with Medicare guidelines. A trained emergency response team is readily available per Yes hospital policies and procedures. Continue HBOT as ordered. Yes Electronic Signature(s) Signed: 03/20/2023 12:32:58 PM By: Geralyn Corwin DO Entered By: Geralyn Corwin on 03/20/2023 12:29:41 [name] ([id number]) [id number] Page 2 of 2 -------------------------------------------------------------------------------- HBO Safety Checklist Details Patient Name: Date of Service: [name], [name] 03/20/2023 7:30 A M Medical Record Number: [id number] Patient Account Number: [id number] Date of Birth/Sex: Treating RN: 07-24-1966 (57 y.o. [name] Primary Care [place] Crance: [name] Other Clinician: Referring Macyn Shropshire: Treating Ronnika Collett/Extender: Donnetta Hail in Treatment: 12 HBO Safety Checklist Items Safety Checklist Consent Form Signed Patient voided / foley secured and emptied When did you last eato 0700 Last dose of injectable or oral agent 0630 17 units lantus Ostomy pouch emptied and vented if applicable NA All implantable devices assessed, documented and approved NA Intravenous access site secured and place NA Valuables secured Linens and cotton and cotton/polyester blend (less than 51% polyester) Personal oil-based products / skin lotions / body lotions removed Wigs or hairpieces removed Smoking or tobacco materials removed Books / newspapers / magazines / loose paper removed Cologne, aftershave, perfume and deodorant removed Jewelry removed (may wrap wedding band) Make-up removed Hair care products removed Battery operated devices (external) removed Heating patches and chemical warmers removed Titanium eyewear removed Nail polish cured greater than 10 hours Casting material cured greater than 10 hours NA Hearing aids removed NA Loose dentures or partials removed NA Prosthetics have been  removed NA Patient demonstrates correct use of air break device (if applicable) Patient concerns have been addressed Patient grounding bracelet on and cord attached to chamber Specifics for Inpatients (complete in addition to above) Medication sheet sent with patient NA Intravenous medications needed or due during therapy sent with patient NA Drainage tubes (e.g. nasogastric  tube or chest tube secured and vented) NA Endotracheal or Tracheotomy tube secured NA Cuff deflated of air and inflated with saline NA Airway suctioned NA Electronic Signature(s) Signed: 03/20/2023 12:41:36 PM By: Zenaida Deed RN, BSN Entered By: Zenaida Deed on 03/20/2023 09:17:31

## 2023-03-20 NOTE — Progress Notes (Signed)
[name] ([id number]) [id number] Page 1 of 9 Visit Report for 03/19/2023 Arrival Information Details Patient Name: Date of Service: [place], [name] 03/19/2023 11:00 A M Medical Record Number: [id number] Patient Account Number: [id number] Date of Birth/Sex: Treating RN: 1966/06/21 (57 y.o. [name] Primary Care Youssef Footman: [name] Other Clinician: Referring Jayanna Kroeger: Treating Maxxon Schwanke/Extender: Donnetta Hail in Treatment: 12 Visit Information History Since Last Visit Added or deleted any medications: No Patient Arrived: Ambulatory Any new allergies or adverse reactions: No Arrival Time: 10:53 Had a fall or experienced change in No Accompanied By: spouse activities of daily living that may affect Transfer Assistance: None risk of falls: Patient Requires Transmission-Based Precautions: No Signs or symptoms of abuse/neglect since last visito No Patient Has Alerts: No Hospitalized since last visit: No Implantable device outside of the clinic excluding No cellular tissue based products placed in the center since last visit: Pain Present Now: Yes Electronic Signature(s) Signed: 03/19/2023 4:53:14 PM By: Karie Schwalbe RN Entered By: Karie Schwalbe on 03/19/2023 10:54:45 -------------------------------------------------------------------------------- Clinic Level of Care Assessment Details Patient Name: Date of Service: [place], [name] 03/19/2023 11:00 A M Medical Record Number: [id number] Patient Account Number: [id number] Date of Birth/Sex: Treating RN: 14-May-1966 (57 y.o. [name] Primary Care Grettell Ransdell: [name] Other Clinician: Referring Yoanna Jurczyk: Treating Michaeal Davis/Extender: Donnetta Hail in Treatment: 12 Clinic Level of Care Assessment Items TOOL 4 Quantity Score X- 1 0 Use when only an EandM is performed on FOLLOW-UP visit ASSESSMENTS - Nursing  Assessment / Reassessment X- 1 10 Reassessment of Co-morbidities (includes updates in patient status) X- 1 5 Reassessment of Adherence to Treatment Plan ASSESSMENTS - Wound and Skin A ssessment / Reassessment X - Simple Wound Assessment / Reassessment - one wound 1 5 []  - 0 Complex Wound Assessment / Reassessment - multiple wounds []  - 0 Dermatologic / Skin Assessment (not related to wound area) ASSESSMENTS - Focused Assessment []  - 0 Circumferential Edema Measurements - multi extremities []  - 0 Nutritional Assessment / Counseling / Intervention []  - 0 Lower Extremity Assessment (monofilament, tuning fork, pulses) [name], [name] ([id number]) [id number] Page 2 of 9 []  - 0 Peripheral Arterial Disease Assessment (using hand held doppler) ASSESSMENTS - Ostomy and/or Continence Assessment and Care []  - 0 Incontinence Assessment and Management []  - 0 Ostomy Care Assessment and Management (repouching, etc.) PROCESS - Coordination of Care X - Simple Patient / Family Education for ongoing care 1 15 []  - 0 Complex (extensive) Patient / Family Education for ongoing care X- 1 10 Staff obtains Chiropractor, Records, T Results / Process Orders est X- 1 10 Staff telephones HHA, Nursing Homes / Clarify orders / etc []  - 0 Routine Transfer to another Facility (non-emergent condition) []  - 0 Routine Hospital Admission (non-emergent condition) []  - 0 New Admissions / Manufacturing engineer / Ordering NPWT Apligraf, etc. , []  - 0 Emergency Hospital Admission (emergent condition) X- 1 10 Simple Discharge Coordination []  - 0 Complex (extensive) Discharge Coordination PROCESS - Special Needs []  - 0 Pediatric / Minor Patient Management []  - 0 Isolation Patient Management []  - 0 Hearing / Language / Visual special needs []  - 0 Assessment of Community assistance (transportation, D/C planning, etc.) []  - 0 Additional assistance / Altered mentation []  -  0 Support Surface(s) Assessment (bed, cushion, seat, etc.) INTERVENTIONS - Wound Cleansing / Measurement X - Simple Wound Cleansing - one wound 1 5 []  - 0 Complex Wound Cleansing - multiple wounds X-  1 5 Wound Imaging (photographs - any number of wounds) []  - 0 Wound Tracing (instead of photographs) X- 1 5 Simple Wound Measurement - one wound []  - 0 Complex Wound Measurement - multiple wounds INTERVENTIONS - Wound Dressings X - Small Wound Dressing one or multiple wounds 1 10 []  - 0 Medium Wound Dressing one or multiple wounds []  - 0 Large Wound Dressing one or multiple wounds []  - 0 Application of Medications - topical []  - 0 Application of Medications - injection INTERVENTIONS - Miscellaneous []  - 0 External ear exam []  - 0 Specimen Collection (cultures, biopsies, blood, body fluids, etc.) []  - 0 Specimen(s) / Culture(s) sent or taken to Lab for analysis []  - 0 Patient Transfer (multiple staff / Nurse, adult / Similar devices) []  - 0 Simple Staple / Suture removal (25 or less) []  - 0 Complex Staple / Suture removal (26 or more) []  - 0 Hypo / Hyperglycemic Management (close monitor of Blood Glucose) []  - 0 Ankle / Brachial Index (ABI) - do not check if billed separately [name], [name] ([id number]) [id number] Page 3 of 9 X- 1 5 Vital Signs Has the patient been seen at the hospital within the last three years: Yes Total Score: 95 Level Of Care: New/Established - Level 3 Electronic Signature(s) Signed: 03/19/2023 4:53:14 PM By: Karie Schwalbe RN Entered By: Karie Schwalbe on 03/19/2023 11:56:48 -------------------------------------------------------------------------------- Encounter Discharge Information Details Patient Name: Date of Service: [name], [name] [name] 03/19/2023 11:00 A M Medical Record Number: [id number] Patient Account Number: [id number] Date of Birth/Sex: Treating RN: 1966-04-06 (57 y.o. [name] Primary Care  Deshawna Mcneece: [name] Other Clinician: Referring Stephie Xu: Treating Atwell Mcdanel/Extender: Donnetta Hail in Treatment: 12 Encounter Discharge Information Items Discharge Condition: Stable Ambulatory Status: Ambulatory Discharge Destination: Home Transportation: Private Auto Accompanied By: spouse/significant other Schedule Follow-up Appointment: Yes Clinical Summary of Care: Patient Declined Electronic Signature(s) Signed: 03/19/2023 4:53:14 PM By: Karie Schwalbe RN Entered By: Karie Schwalbe on 03/19/2023 11:58:05 -------------------------------------------------------------------------------- Lower Extremity Assessment Details Patient Name: Date of Service: [place], [name] 03/19/2023 11:00 A M Medical Record Number: [id number] Patient Account Number: [id number] Date of Birth/Sex: Treating RN: 03-18-1966 (57 y.o. [name] Primary Care Salih Williamson: [name] Other Clinician: Referring Logen Fowle: Treating Brieanne Mignone/Extender: Donnetta Hail in Treatment: 12 Edema Assessment Assessed: [name]: No] Franne Forts: No] Edema: [Left: N] [Right: o] Calf Left: Right: Point of Measurement: 32 cm From Medial Instep 35 cm Ankle Left: Right: Point of Measurement: 13 cm From Medial Instep 20.5 cm Vascular Assessment Pulses: [name], [name] ([id number]) [Right:[id number] Page 4 of 9] Dorsalis Pedis Palpable: [Left:Yes] Extremity colors, hair growth, and conditions: Extremity Color: [Left:Normal] Hair Growth on Extremity: [Left:Yes] Temperature of Extremity: [Left:Warm] Capillary Refill: [Left:< 3 seconds] Dependent Rubor: [Left:No No] Electronic Signature(s) Signed: 03/19/2023 4:53:14 PM By: Karie Schwalbe RN Entered By: Karie Schwalbe on 03/19/2023 10:59:02 -------------------------------------------------------------------------------- Multi Wound Chart Details Patient Name: Date of  Service: [name], [name] [name] 03/19/2023 11:00 A M Medical Record Number: [id number] Patient Account Number: [id number] Date of Birth/Sex: Treating RN: July 30, 1966 (57 y.o. M) Primary Care Myda Detwiler: [name] Other Clinician: Referring Mya Suell: Treating Itzae Miralles/Extender: Donnetta Hail in Treatment: 12 Vital Signs Height(in): 68 Pulse(bpm): 72 Weight(lbs): 200 Blood Pressure(mmHg): 125/72 Body Mass Index(BMI): 30.4 Temperature(F): 97.9 Respiratory Rate(breaths/min): 18 [1:Photos:] [N/A:N/A] Left Achilles N/A N/A Wound Location: Gradually Appeared N/A N/A Wounding Event: Diabetic Wound/Ulcer of the Lower N/A N/A Primary Etiology: Extremity Arterial Insufficiency Ulcer N/A N/A Secondary  Etiology: Cataracts, Coronary Artery Disease, N/A N/A Comorbid History: Hypertension, Peripheral Venous Disease, Type I Diabetes, Rheumatoid Arthritis, Osteoarthritis 12/07/2022 N/A N/A Date Acquired: 12 N/A N/A Weeks of Treatment: Open N/A N/A Wound Status: No N/A N/A Wound Recurrence: 0.5x0.6x0.2 N/A N/A Measurements L x W x D (cm) 0.236 N/A N/A A (cm) : rea 0.047 N/A N/A Volume (cm) : 53.10% N/A N/A % Reduction in A rea: 6.00% N/A N/A % Reduction in Volume: Grade 2 N/A N/A Classification: Medium N/A N/A Exudate A mount: Serous N/A N/A Exudate Type: amber N/A N/A Exudate Color: Epibole N/A N/A Wound Margin: Large (67-100%) N/A N/A Granulation A mount: Pink, Pale N/A N/A Granulation Quality: Small (1-33%) N/A N/A Necrotic A mount: Fat Layer (Subcutaneous Tissue): Yes N/A N/A Exposed Structures: TendonFRANDY, [name] ([id number]) [id number] Page 5 of 9 Fascia: No Muscle: No Joint: No Bone: No None N/A N/A Epithelialization: Excoriation: No N/A N/A Periwound Skin Texture: Induration: No Callus: No Crepitus: No Rash: No Scarring: No Maceration: Yes N/A N/A Periwound Skin Moisture: Dry/Scaly:  No Atrophie Blanche: No N/A N/A Periwound Skin Color: Cyanosis: No Ecchymosis: No Erythema: No Hemosiderin Staining: No Mottled: No Pallor: No Rubor: No Dr [name] placed Darlen Round N/A N/A Assessment Notes: Treatment Notes Electronic Signature(s) Signed: 03/19/2023 4:25:59 PM By: Geralyn Corwin DO Entered By: Geralyn Corwin on 03/19/2023 11:35:05 -------------------------------------------------------------------------------- Multi-Disciplinary Care Plan Details Patient Name: Date of Service: [name], [name] [name] 03/19/2023 11:00 A M Medical Record Number: [id number] Patient Account Number: [id number] Date of Birth/Sex: Treating RN: [birth year] (57 y.o. [name] Primary Care Antwone Capozzoli: [name] Other Clinician: Referring Frankie Scipio: Treating Jamille Fisher/Extender: Donnetta Hail in Treatment: 12 Active Inactive HBO Nursing Diagnoses: Potential for barotraumas to ears, sinuses, teeth, and lungs or cerebral gas embolism related to changes in atmospheric pressure inside hyperbaric oxygen chamber Potential for oxygen toxicity seizures related to delivery of 100% oxygen at an increased atmospheric pressure Potential for pulmonary oxygen toxicity related to delivery of 100% oxygen at an increased atmospheric pressure Goals: Patient and/or family will be able to state/discuss factors appropriate to the management of their disease process during treatment Date Initiated: 02/16/2023 Target Resolution Date: 04/14/2023 Goal Status: Active Patient will tolerate the hyperbaric oxygen therapy treatment Date Initiated: 02/16/2023 Target Resolution Date: 04/14/2023 Goal Status: Active Patient/caregiver will verbalize understanding of HBO goals, rationale, procedures and potential hazards Date Initiated: 02/16/2023 Target Resolution Date: 04/14/2023 Goal Status: Active Interventions: Administer the correct therapeutic gas delivery based on the patients needs and  limitations, per physician order Assess and provide for patients comfort related to the hyperbaric environment and equalization of middle ear Assess for signs and symptoms related to adverse events, including but not limited to confinement anxiety, pneumothorax, oxygen toxicity and baurotrauma Assess patient for any history of confinement anxiety Assess patient's knowledge and expectations regarding hyperbaric medicine and provide education related to the hyperbaric environment, goals of treatment and prevention of adverse events Implement protocols to decrease risk of pneumothorax in high risk patients [name], [name] ([id number]) [id number] Page 6 of 9 Notes: Wound/Skin Impairment Nursing Diagnoses: Impaired tissue integrity Knowledge deficit related to smoking impact on wound healing Knowledge deficit related to ulceration/compromised skin integrity Goals: Patient will have a decrease in wound volume by X% from date: (specify in notes) Date Initiated: 12/25/2022 Target Resolution Date: 04/14/2023 Goal Status: Active Patient/caregiver will verbalize understanding of skin care regimen Date Initiated: 12/25/2022 Target Resolution Date: 04/14/2023 Goal Status: Active Ulcer/skin breakdown will have a volume  reduction of 30% by week 4 Date Initiated: 12/25/2022 Date Inactivated: 01/27/2023 Target Resolution Date: 01/22/2023 Unmet Reason: no major changes; Goal Status: Unmet continue current treatment. Interventions: Assess patient/caregiver ability to obtain necessary supplies Assess patient/caregiver ability to perform ulcer/skin care regimen upon admission and as needed Assess ulceration(s) every visit Provide education on smoking Provide education on ulcer and skin care Notes: Electronic Signature(s) Signed: 03/19/2023 4:53:14 PM By: Karie Schwalbe RN Entered By: Karie Schwalbe on 03/19/2023  11:52:32 -------------------------------------------------------------------------------- Pain Assessment Details Patient Name: Date of Service: [name], [name] 03/19/2023 11:00 A M Medical Record Number: [id number] Patient Account Number: [id number] Date of Birth/Sex: Treating RN: 1965/08/09 (57 y.o. [name] Primary Care Tristian Sickinger: [name] Other Clinician: Referring Kirk Basquez: Treating Artesia Berkey/Extender: Donnetta Hail in Treatment: 12 Active Problems Location of Pain Severity and Description of Pain Patient Has Paino Yes Site Locations Pain Location: Generalized Pain With Dressing Change: No Duration of the Pain. Constant / Intermittento Constant Rate the pain. Current Pain Level: 4 Worst Pain Level: 10 Least Pain Level: 3 Tolerable Pain Level: 7 [name], [name] ([id number]) [id number] Page 7 of 9 Character of Pain Describe the Pain: Difficult to Pinpoint Pain Management and Medication Current Pain Management: Medication: Yes Cold Application: No Rest: Yes Massage: No Activity: No [name] BautistaE.N.S.: No Heat Application: No Leg drop or elevation: No Is the Current Pain Management Adequate: Adequate How does your wound impact your activities of daily livingo Sleep: No Bathing: No Appetite: No Relationship With Others: No Bladder Continence: No Emotions: No Bowel Continence: No Work: No Toileting: No Drive: No Dressing: No Hobbies: No Electronic Signature(s) Signed: 03/19/2023 4:53:14 PM By: Karie Schwalbe RN Entered By: Karie Schwalbe on 03/19/2023 10:55:27 -------------------------------------------------------------------------------- Patient/Caregiver Education Details Patient Name: Date of Service: [place], [name] [name] 8/15/2024andnbsp11:00 A M Medical Record Number: [id number] Patient Account Number: [id number] Date of Birth/Gender: Treating RN: 1965/10/14 (58 y.o. [name] Primary Care Physician: [name] Other Clinician: Referring Physician: Treating Physician/Extender: Donnetta Hail in Treatment: 12 Education Assessment Education Provided To: Patient Education Topics Provided Wound/Skin Impairment: Methods: Explain/Verbal Responses: See progress note Electronic Signature(s) Signed: 03/19/2023 4:53:14 PM By: Karie Schwalbe RN Entered By: Karie Schwalbe on 03/19/2023 11:54:22 -------------------------------------------------------------------------------- Wound Assessment Details Patient Name: Date of Service: [name], [name] 03/19/2023 11:00 A M Medical Record Number: [id number] Patient Account Number: [id number] Date of Birth/Sex: Treating RN: February 17, 1966 (57 y.o. [name] Primary Care Adrieana Fennelly: [name] Other Clinician: Referring Jaspal Pultz: Treating Nikesha Kwasny/Extender: Donnetta Hail in Treatment: [address] [place] J ([id number]) [id number] Page 8 of 9 Wound Status Wound Number: 1 Primary Diabetic Wound/Ulcer of the Lower Extremity Etiology: Wound Location: Left Achilles Secondary Arterial Insufficiency Ulcer Wounding Event: Gradually Appeared Etiology: Date Acquired: 12/07/2022 Wound Open Weeks Of Treatment: 12 Status: Clustered Wound: No Comorbid Cataracts, Coronary Artery Disease, Hypertension, Peripheral History: Venous Disease, Type I Diabetes, Rheumatoid Arthritis, Osteoarthritis Photos Wound Measurements Length: (cm) 0.5 Width: (cm) 0.6 Depth: (cm) 0.2 Area: (cm) 0.236 Volume: (cm) 0.047 % Reduction in Area: 53.1% % Reduction in Volume: 6% Epithelialization: None Tunneling: No Undermining: No Wound Description Classification: Grade 2 Wound Margin: Epibole Exudate Amount: Medium Exudate Type: Serous Exudate Color: amber Foul Odor After Cleansing: No Slough/Fibrino Yes Wound Bed Granulation Amount: Large  (67-100%) Exposed Structure Granulation Quality: Pink, Pale Fascia Exposed: No Necrotic Amount: Small (1-33%) Fat Layer (Subcutaneous Tissue) Exposed: Yes Necrotic Quality: Adherent Slough Tendon Exposed: Yes Muscle Exposed: No Joint Exposed:  No Bone Exposed: No Periwound Skin Texture Texture Color No Abnormalities Noted: No No Abnormalities Noted: No Callus: No Atrophie Blanche: No Crepitus: No Cyanosis: No Excoriation: No Ecchymosis: No Induration: No Erythema: No Rash: No Hemosiderin Staining: No Scarring: No Mottled: No Pallor: No Moisture Rubor: No No Abnormalities Noted: No Dry / Scaly: No Maceration: Yes Assessment Notes Dr [name] placed Kerecis Treatment Notes Wound #1 (Achilles) Wound Laterality: Left Cleanser Vashe 5.8 (oz) Discharge Instruction: (On hold (03/19/23) Cleanse the wound with Vashe prior to applying a clean dressing using gauze sponges, not tissue or cotton balls. [name], [name] ([id number]) [id number] Page 9 of 9 Peri-Wound Care Skin Prep Discharge Instruction: On Hold (03/19/23)Use skin prep as directed Topical Primary Dressing Secondary Dressing Zetuvit Plus Silicone Border Dressing 4x4 (in/in) Discharge Instruction: On Hold (03/19/23) Apply silicone border over primary dressing as directed. Secured With Compression Wrap Compression Stockings Add-Ons Gauze and ACE Discharge Instruction: Place on wound, following Dr. Audrie Lia placement of Kerecis skin substitute. Netting Discharge Instruction: Place netting back on top of ACE Electronic Signature(s) Signed: 03/19/2023 4:53:14 PM By: Karie Schwalbe RN Entered By: Karie Schwalbe on 03/19/2023 10:58:39 -------------------------------------------------------------------------------- Vitals Details Patient Name: Date of Service: [name], [name] [name] 03/19/2023 11:00 A M Medical Record Number: [id number] Patient Account Number: [id number] Date of Birth/Sex:  Treating RN: 17-Feb-1966 (57 y.o. [name] Primary Care Sundra Haddix: [name] Other Clinician: Referring Natori Gudino: Treating Emmalynn Pinkham/Extender: Donnetta Hail in Treatment: 12 Vital Signs Time Taken: 10:53 Temperature (F): 97.9 Height (in): 68 Pulse (bpm): 72 Weight (lbs): 200 Respiratory Rate (breaths/min): 18 Body Mass Index (BMI): 30.4 Blood Pressure (mmHg): 125/72 Reference Range: 80 - 120 mg / dl Electronic Signature(s) Signed: 03/19/2023 4:53:14 PM By: Karie Schwalbe RN Entered By: Karie Schwalbe on 03/19/2023 10:54:30

## 2023-03-20 NOTE — Progress Notes (Signed)
[name], [name] ([id number]) [id number] Page 1 of 2 Visit Report for 03/20/2023 Problem List Details Patient Name: Date of Service: [name], [name] 03/20/2023 7:30 A M Medical Record Number: [id number] Patient Account Number: [id number] Date of Birth/Sex: Treating RN: 1966-05-31 (57 y.o. 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 01/27/2023 No Yes other part of foot 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 12/25/2022 No Yes J44.9 Chronic obstructive pulmonary disease, unspecified 12/25/2022 No Yes M06.9 Rheumatoid arthritis, unspecified 12/25/2022 No Yes Z87.891 Personal history of nicotine dependence 01/27/2023 No Yes Inactive Problems Resolved Problems Electronic Signature(s) Signed: 03/20/2023 12:32:58 PM By: Geralyn Corwin DO Signed: 03/20/2023 12:41:36 PM By: Zenaida Deed RN, BSN Entered By: Zenaida Deed on 03/20/2023 09:19:54 -------------------------------------------------------------------------------- SuperBill Details Patient Name: Date of Service: [name], [place] [name] 03/20/2023 [name] ([id number]) [id number] Page 2 of 2 Medical Record Number: [id number] Patient Account Number: [id number] Date of Birth/Sex: Treating RN: 1966/04/04 (57 y.o. 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 J44.9 Chronic obstructive pulmonary disease, unspecified M06.9 Rheumatoid arthritis, unspecified Z87.891 Personal history of nicotine dependence Facility Procedures : CPT4 Code Description: [id number] G0277-(Facility Use Only) HBOT full body chamber, , ICD-10 Diagnosis Description I70.245 Atherosclerosis of native arteries of left leg with ulceration of L97.528 Non-pressure chronic ulcer of other part of left foot  with other S91.302A Unspecified open wound, left foot, initial encounter 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 Modifier: other part of f specified severi Quantity: 4 oot ty Physician Procedures : CPT4 Code Description Modifier [id number] [phone] - WC PHYS HYPERBARIC OXYGEN THERAPY ICD-10 Diagnosis Description I70.245 Atherosclerosis of native arteries of left leg with ulceration of other part of fo L97.528 Non-pressure chronic ulcer of other part of  left foot with other specified severit S91.302A Unspecified open wound, left foot, initial encounter 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 Quantity: 1 ot y Psychologist, [facility]) Signed: 03/20/2023 12:32:58 PM By: Geralyn Corwin DO Signed: 03/20/2023 12:41:36 PM By: Zenaida Deed RN, BSN Entered By: Zenaida Deed on 03/20/2023 10:49:38

## 2023-03-23 ENCOUNTER — Encounter (HOSPITAL_BASED_OUTPATIENT_CLINIC_OR_DEPARTMENT_OTHER): Payer: 59 | Admitting: Internal Medicine

## 2023-03-23 ENCOUNTER — Ambulatory Visit (INDEPENDENT_AMBULATORY_CARE_PROVIDER_SITE_OTHER): Payer: 59 | Admitting: Orthopedic Surgery

## 2023-03-23 ENCOUNTER — Telehealth: Payer: Self-pay

## 2023-03-23 DIAGNOSIS — L97423 Non-pressure chronic ulcer of left heel and midfoot with necrosis of muscle: Secondary | ICD-10-CM

## 2023-03-23 MED ORDER — SULFAMETHOXAZOLE-TRIMETHOPRIM 800-160 MG PO TABS: 1.0000 | ORAL_TABLET | Freq: Two times a day (BID) | ORAL | 0 refills | Status: DC

## 2023-03-23 NOTE — Telephone Encounter (Signed)
Pt would like a refill of his Bactrim DS sent to [facility] in [place]. He was in the office this morning s/p achilles debridement.

## 2023-03-24 ENCOUNTER — Encounter: Payer: Self-pay | Admitting: Cardiology

## 2023-03-24 ENCOUNTER — Encounter: Payer: Self-pay | Admitting: Orthopedic Surgery

## 2023-03-24 ENCOUNTER — Other Ambulatory Visit: Payer: Self-pay | Admitting: Cardiology

## 2023-03-24 ENCOUNTER — Ambulatory Visit (HOSPITAL_BASED_OUTPATIENT_CLINIC_OR_DEPARTMENT_OTHER): Payer: 59 | Admitting: Internal Medicine

## 2023-03-24 ENCOUNTER — Other Ambulatory Visit: Payer: Self-pay | Admitting: Orthopedic Surgery

## 2023-03-24 ENCOUNTER — Ambulatory Visit: Payer: 59 | Admitting: Cardiology

## 2023-03-24 ENCOUNTER — Other Ambulatory Visit: Payer: Self-pay | Admitting: Interventional Radiology

## 2023-03-24 ENCOUNTER — Encounter (HOSPITAL_BASED_OUTPATIENT_CLINIC_OR_DEPARTMENT_OTHER): Payer: 59 | Admitting: Internal Medicine

## 2023-03-24 VITALS — BP 126/57 | HR 70 | Resp 16 | Ht 68.0 in | Wt 202.4 lb

## 2023-03-24 DIAGNOSIS — I25118 Atherosclerotic heart disease of native coronary artery with other forms of angina pectoris: Secondary | ICD-10-CM

## 2023-03-24 DIAGNOSIS — I70245 Atherosclerosis of native arteries of left leg with ulceration of other part of foot: Secondary | ICD-10-CM | POA: Diagnosis not present

## 2023-03-24 DIAGNOSIS — S91302A Unspecified open wound, left foot, initial encounter: Secondary | ICD-10-CM

## 2023-03-24 DIAGNOSIS — E1051 Type 1 diabetes mellitus with diabetic peripheral angiopathy without gangrene: Secondary | ICD-10-CM

## 2023-03-24 DIAGNOSIS — E10621 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: Secondary | ICD-10-CM

## 2023-03-24 DIAGNOSIS — L97528 Non-pressure chronic ulcer of other part of left foot with other specified severity: Secondary | ICD-10-CM | POA: Diagnosis not present

## 2023-03-24 DIAGNOSIS — I6523 Occlusion and stenosis of bilateral carotid arteries: Secondary | ICD-10-CM

## 2023-03-24 DIAGNOSIS — L97522 Non-pressure chronic ulcer of other part of left foot with fat layer exposed: Secondary | ICD-10-CM

## 2023-03-24 LAB — GLUCOSE, CAPILLARY
Glucose-Capillary: 183 mg/dL — ABNORMAL HIGH (ref 70–99)
Glucose-Capillary: 224 mg/dL — ABNORMAL HIGH (ref 70–99)

## 2023-03-24 MED ORDER — RIVAROXABAN 2.5 MG PO TABS
2.5000 mg | ORAL_TABLET | Freq: Two times a day (BID) | ORAL | 1 refills | Status: AC
Start: 2023-03-24 — End: ?

## 2023-03-24 NOTE — Progress Notes (Signed)
Patient is a 57 year old gentleman who is status post left Achilles debridement and application of Kerecis tissue graft July 31.  He had reapplication of tissue graft last week.  He is currently on Bactrim DS.  Patient is concerned that his wound will not heal.  The wound was debrided and there was healthy granulation tissue.  Kerecis tissue graft was applied follow-up in 1 week.  Anticipate he will be out of work over 2 months from now.  He is on [place].

## 2023-03-24 NOTE — Progress Notes (Signed)
Primary Physician:  Garlan Fillers, MD  Patient ID: [name], male    DOB: February 01, 1966, 57 y.o.   MRN: [id number]  Subjective:   Chief Complaint  Patient presents with   Coronary artery disease of native artery of native heart wi   Follow-up    HPI: JOSEH MAZMANIAN  is a 57 y.o. 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  He has remained abstinent since developing leg ulcer.  States that otherwise he has not had any chest pain, dyspnea has remained stable, he is being followed by pulmonary medicine as well.  No PND or orthopnea or leg edema.  He does not have pain in his ulceration on his left leg just above the Achilles tendon.  Past Medical History:  Diagnosis Date   Anginal pain (HCC)    Anxiety    Arthritis    RA IN HANDS   Asthma    CAD (coronary artery disease) 04/27/2014   Chronic renal disease, stage 3, moderately decreased glomerular filtration rate between 30-59 mL/min/1.73 square meter (HCC) 05/03/2014   COPD (chronic obstructive pulmonary disease) (HCC)    Coronary artery disease    Diabetes (HCC) 05/03/2014   Diabetes mellitus without complication (HCC)    type 1   Hyperlipidemia    Left shoulder pain    Peripheral vascular disease (HCC)    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/03/2014   Shortness of breath    Sleep apnea    mild OSA, no CPAP   Unstable angina pectoris (HCC) 04/25/2014    Past Surgical History:  Procedure Laterality Date   ABDOMINAL  AORTOGRAM W/LOWER EXTREMITY N/A 01/15/2023   Procedure: ABDOMINAL AORTOGRAM W/LOWER EXTREMITY;  Surgeon: Cephus Shelling, MD;  Location: [facility] INVASIVE CV LAB;  Service: Cardiovascular;  Laterality: N/A;   CARDIAC CATHETERIZATION  04/25/2014   BY DR Jacinto Halim   CARDIAC CATHETERIZATION N/A 10/30/2015   Procedure: Left Heart Cath and Cors/Grafts Angiography;  Surgeon: Yates Decamp, MD;  Location: [facility] INVASIVE CV LAB;  Service: Cardiovascular;  Laterality: N/A;   CARPAL TUNNEL RELEASE     CATARACT EXTRACTION, BILATERAL     CORONARY ARTERY BYPASS GRAFT N/A 04/27/2014   Procedure: CORONARY ARTERY BYPASS GRAFTING on pump using left internal mammary artery to LAD coronary artery, right great saphenous vein graft to diagonal coronary artery with sequential to OM1 and circumflex coronary arteries. Right greater saphenous vein graft to posterior descending coronary artery. ;  Surgeon: Delight Ovens, MD;  Location: [facility] OR;  Service: Open Heart Surgery;  Laterality: N   elbow drained Left 05/09/2019   ENDOVEIN HARVEST OF GREATER SAPHENOUS VEIN Right 04/27/2014   Procedure: ENDOVEIN HARVEST OF GREATER SAPHENOUS VEIN;  Surgeon: Delight Ovens, MD;  Location: [facility] OR;  Service: Open Heart Surgery;  Laterality: Right;   EXCISION ORAL TUMOR N/A 03/01/2018   Procedure: EXCISION ORAL TUMOR;  Surgeon: Christia Reading, MD;  Location: [facility];  Service: ENT;  Laterality: N/A;   EYE SURGERY     LASER  EYE SURGERY Bilateral    astigmatism correction    HARDWARE REMOVAL Left 10/03/2020   Procedure: LEFT FOOT REMOVAL HARDWARE, PLACE VANC POWDER;  Surgeon: Nadara Mustard, MD;  Location: [facility] OR;  Service: Orthopedics;  Laterality: Left;   I & D EXTREMITY Left 03/04/2023   Procedure: LEFT ACHILLES DEBRIDEMENT;  Surgeon: Nadara Mustard, MD;  Location: [facility] OR;  Service: Orthopedics;  Laterality: Left;   INTRAOPERATIVE TRANSESOPHAGEAL ECHOCARDIOGRAM N/A 04/27/2014   Procedure: INTRAOPERATIVE TRANSESOPHAGEAL  ECHOCARDIOGRAM;  Surgeon: Delight Ovens, MD;  Location: [facility] OR;  Service: Open Heart Surgery;  Laterality: N/A;   IR RADIOLOGIST EVAL & MGMT  12/22/2022   LEFT HEART CATHETERIZATION WITH CORONARY ANGIOGRAM N/A 04/25/2014   Procedure: LEFT HEART CATHETERIZATION WITH CORONARY ANGIOGRAM;  Surgeon: Pamella Pert, MD;  Location: [facility] CATH LAB;  Service: Cardiovascular;  Laterality: N/A;   MOUTH SURGERY  11/15/2017   tongue surgery    ORIF TOE FRACTURE Left 03/22/2020   Procedure: OPEN REDUCTION INTERNAL FIXATION (ORIF) Non Union 5th Metatarsal;  Surgeon: Kathryne Hitch, MD;  Location: [facility];  Service: Orthopedics;  Laterality: Left;   Family History  Problem Relation Age of Onset   Stomach cancer Mother    Cancer Mother    Prostate cancer Father    Heart disease Father    Cancer - Prostate Father    Heart disease Brother    Asthma Daughter    Healthy Daughter    Asthma Daughter     Social History   Tobacco Use   Smoking status: Former    Current packs/day: 0.25    Average packs/day: 0.3 packs/day for 34.0 years (8.5 ttl pk-yrs)    Types: Cigarettes   Smokeless tobacco: Never  Substance Use Topics   Alcohol use: Not Currently    Comment: RARE    Marital status: Divorced   Review of Systems  Cardiovascular:  Positive for dyspnea on exertion (Stable). Negative for chest pain and leg swelling.  Skin:  Positive for poor wound healing (Left ankle wound).   Objective:  Blood pressure (!) 126/57, pulse 70, resp. rate 16, height 5\' 8"  (1.727 m), weight 202 lb 6.4 oz (91.8 kg), SpO2 95%. Body mass index is 30.77 kg/m.      03/24/2023    2:24 PM 03/04/2023   10:00 AM 03/04/2023    9:45 AM  Vitals with BMI  Height 5\' 8"     Weight 202 lbs 6 oz    BMI 30.78    Systolic 126 135 161  Diastolic 57 73 68  Pulse 70 55 58     Physical Exam Vitals reviewed.  Constitutional:      Appearance: He is well-developed.  Neck:     Vascular: Carotid bruit  (bilateral) present. No JVD.  Cardiovascular:     Rate and Rhythm: Normal rate and regular rhythm.     Pulses:          Popliteal pulses are 2+ on the right side and 2+ on the left side.       Dorsalis pedis pulses are 1+ on the right side and 0 on the left side.       Posterior tibial pulses are 0 on the right side and 0 on the left side.     Heart sounds: Normal heart sounds, S1 normal and S2 normal. No murmur heard.    No gallop.  Pulmonary:     Effort: Pulmonary effort is normal. No accessory muscle usage.  Breath sounds: Normal breath sounds.  Abdominal:     General: Bowel sounds are normal.     Palpations: Abdomen is soft.  Musculoskeletal:     Right lower leg: No edema.     Left lower leg: No edema.     Comments: Left leg just above Achilles tendon is a open wound that is fat layer exposed.  Good granulation tissue.    Laboratory examination:   Lab Results  Component Value Date   NA 135 03/04/2023   K 3.9 03/04/2023   CO2 26 03/04/2023   GLUCOSE 216 (H) 03/04/2023   BUN 24 (H) 03/04/2023   CREATININE 1.55 (H) 03/04/2023   CALCIUM 8.6 (L) 03/04/2023   GFRNONAA 51 (L) 03/04/2023       Latest Ref Rng & Units 03/04/2023    7:00 AM 01/15/2023    1:35 PM 10/03/2020    6:45 AM  CMP  Glucose 70 - 99 mg/dL 161  096  045   BUN 6 - 20 mg/dL 24  26  30    Creatinine 0.61 - 1.24 mg/dL 4.09  8.11  9.14   Sodium 135 - 145 mmol/L 135  139  135   Potassium 3.5 - 5.1 mmol/L 3.9  4.4  4.4   Chloride 98 - 111 mmol/L 101  104  104   CO2 22 - 32 mmol/L 26   23   Calcium 8.9 - 10.3 mg/dL 8.6   8.6       Latest Ref Rng & Units 03/04/2023    7:00 AM 01/15/2023    1:35 PM 04/01/2021    3:37 PM  CBC  WBC 4.0 - 10.5 K/uL 11.4     Hemoglobin 13.0 - 17.0 g/dL 78.2  95.6  21.3   Hematocrit 39.0 - 52.0 % 35.6  38.0  33.9   Platelets 150 - 400 K/uL 267      Lipid Panel     Component Value Date/Time   CHOL 81 (L) 12/09/2021 1629   TRIG 77 12/09/2021 1629   HDL 28 (L) 12/09/2021 1629    CHOLHDL 1.9 02/10/2019 0907   LDLCALC 37 12/09/2021 1629   Lab Results  Component Value Date   TSH 2.00 06/01/2014    External labs:  04/14/2022: Hemoglobin A1c 6.5% Hemoglobin 12.3, hematocrit 39.0, platelets 264  01/17/2022: Sodium 134, potassium 5.5, glucose 154, BUN 19, creatinine 1.6 Sed Rate 17, CRP <5.0  Allergies   Allergies  Allergen Reactions   Tramadol Shortness Of Breath   Metoprolol Diarrhea   Niacin And Related     night sweats     Final Medications at End of Visit     Current Outpatient Medications:    albuterol (VENTOLIN HFA) 108 (90 Base) MCG/ACT inhaler, Inhale 2 puffs into the lungs every 4 (four) hours as needed., Disp: , Rfl:    Ascorbic Acid (VITAMIN C PO), Take 2,000 mg by mouth daily., Disp: , Rfl:    aspirin EC 81 MG tablet, Take 1 tablet (81 mg total) by mouth daily. Swallow whole., Disp: 150 tablet, Rfl: 2   bisoprolol (ZEBETA) 5 MG tablet, TAKE ONE-HALF TABLET BY MOUTH  DAILY, Disp: 45 tablet, Rfl: 3   Blood Glucose Monitoring Suppl (ONETOUCH VERIO FLEX SYSTEM) w/Device KIT, USE TO CHECK BLOOD GLUCOSE UP TO 10 TIMES PER DAY, Disp: , Rfl:    buPROPion (WELLBUTRIN SR) 150 MG 12 hr tablet, TAKE 1 TABLET BY MOUTH DAILY, Disp: 90 tablet, Rfl: 3   calcium carbonate (OSCAL)  1500 (600 Ca) MG TABS tablet, Take 600 mg of elemental calcium by mouth daily., Disp: , Rfl:    cholecalciferol (VITAMIN D3) 25 MCG (1000 UNIT) tablet, Take 1,000 Units by mouth daily., Disp: , Rfl:    COD LIVER OIL PO, Take 1 capsule by mouth daily., Disp: , Rfl:    Continuous Blood Gluc Sensor (FREESTYLE LIBRE 14 DAY SENSOR) MISC, CHANGE EVERY 14 DAYS TO MONITOR BLOOD GLUCOSE, Disp: , Rfl:    gabapentin (NEURONTIN) 300 MG capsule, Take 1 capsule (300 mg total) by mouth at bedtime., Disp: 30 capsule, Rfl: 2   HUMALOG 100 UNIT/ML injection, Inject 15-20 Units into the skin 3 (three) times daily with meals. Sliding scale  Depending on carb intake, Disp: , Rfl:     HYDROcodone-acetaminophen (NORCO/VICODIN) 5-325 MG tablet, Take 1 tablet by mouth every 4 (four) hours as needed., Disp: 30 tablet, Rfl: 0   insulin glargine (LANTUS) 100 UNIT/ML injection, Inject 17 Units into the skin 2 (two) times daily., Disp: , Rfl:    levofloxacin (LEVAQUIN) 750 MG tablet, Take 750 mg by mouth daily., Disp: , Rfl:    losartan (COZAAR) 25 MG tablet, Take 25 mg by mouth daily., Disp: , Rfl:    Magnesium 500 MG CAPS, Take 500 mg by mouth 2 (two) times daily., Disp: , Rfl:    nitroGLYCERIN (NITROSTAT) 0.4 MG SL tablet, DISSOLVE 1 TABLET UNDER THE TONGUE EVERY 5 MINUTES AS  NEEDED FOR CHEST PAIN. MAX  OF 3 TABLETS IN 15 MINUTES. CALL 911 IF PAIN PERSISTS. (Patient taking differently: Place 0.4 mg under the tongue every 5 (five) minutes as needed for chest pain.), Disp: 100 tablet, Rfl: 3   Omega-3 Fatty Acids (FISH OIL PO), Take 5,000 mg by mouth daily., Disp: , Rfl:    OVER THE COUNTER MEDICATION, Take 1 capsule by mouth 2 (two) times daily. Nitric oxide blood flow supplement, Disp: , Rfl:    pentoxifylline (TRENTAL) 400 MG CR tablet, TAKE 1 TABLET BY MOUTH THREE TIMES DAILY WITH MEALS, Disp: 90 tablet, Rfl: 0   Probiotic Product (ALIGN) 4 MG CAPS, Take 4 mg by mouth daily., Disp: , Rfl:    ranolazine (RANEXA) 500 MG 12 hr tablet, Take 250 mg by mouth at bedtime., Disp: , Rfl:    rivaroxaban (XARELTO) 2.5 MG TABS tablet, Take 1 tablet (2.5 mg total) by mouth 2 (two) times daily., Disp: 180 tablet, Rfl: 1   SANTYL 250 UNIT/GM ointment, Apply 1 Application topically 2 (two) times daily., Disp: , Rfl:    simvastatin (ZOCOR) 20 MG tablet, TAKE 1 TABLET BY MOUTH AT BEDTIME, Disp: 90 tablet, Rfl: 0   sulfamethoxazole-trimethoprim (BACTRIM DS) 800-160 MG tablet, Take 1 tablet by mouth 2 (two) times daily., Disp: , Rfl:    TURMERIC CURCUMIN PO, Take 1,500 mg by mouth 2 (two) times daily., Disp: , Rfl:    varenicline (CHANTIX) 0.5 MG tablet, Take 0.5 mg by mouth daily., Disp: , Rfl:     vitamin B-12 (CYANOCOBALAMIN) 500 MCG tablet, Take 500 mcg by mouth daily., Disp: , Rfl:    vitamin E 180 MG (400 UNITS) capsule, Take 400 Units by mouth daily., Disp: , Rfl:    Radiology    Chest CT 05/19/2022: IMPRESSION: 1. Lung-RADS 1, negative. Continue annual screening with low-dose chest CT without contrast in 12 months. 2. Left nephrolithiasis. 3. Median sternotomy. 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 4. Aortic atherosclerosis (ICD10-I70.0) and emphysema (ICD10-J43.9).  Cardiac Studies:   CABG 04/27/2014: LIMA to LAD, SVG to D1, SVG to OM1 and distal circumflex, SVG to PDA. Dr. Bea Laura  Coronary angiogram 10/30/2015:  Normal LVEF, 55%. Mid LAD occluded after D1. LIMA to LAD, SVG to D1 patent.  Circumflex occluded in the midsegment after OM1. SVG to OM2 patent, sequential branch to his to circumflex occluded. Distal circumflex filled by Ipsilateral collaterals to the distal circumflex evident.  SVG to RCA widely patent. Native RCA high-grade 80-90% mid stenosis and distal calcific 80% stenosis. The vein graft retrogradely fills the PL branch.    SVG to RCA patent has not been included in this hand diagram  Lexiscan Tetrofosmin Stress Test  07/20/2019: Nondiagnostic ECG stress. There is a small sized mild reversible mild defect in the lateral region.  Compared to the study done on 10/12/2015, large inferolateral ischemia not present. Defect is consistent with known occluded distal circumflex coronary artery. Stress LV EF: 62%.  Intermediate risk study.   Echocardiogram 09/25/2020:    Normal LV systolic function with EF 62%. Left ventricle cavity is normal  in size. Normal global wall motion. Calculated EF 62%.  Structurally normal mitral valve. No evidence of mitral stenosis. Mild  (Grade I) mitral regurgitation.  Structurally normal tricuspid valve with trace regurgitation. No evidence  of tricuspid valve stenosis. No  evidence of pulmonary hypertension.  No significant change from 06/08/2014.  Carotid artery duplex 09/05/2022: Duplex suggests stenosis in the right internal carotid artery (1-15%). Homogeneous plaque. Duplex suggests stenosis in the left internal carotid artery (1-15%). Homogeneous plaque.  There is heterogeneous plaque and shadowing noted in the carotid bifurcation bilaterally without hemodynamically significant stenosis.  Antegrade right vertebral artery flow. Antegrade left vertebral artery flow. Compared to 11/20/2021, bilateral ICA stenosis of 16-49% not present.  EKG   EKG 03/24/2023: Normal sinus rhythm at rate of 72 bpm, left atrial enlargement.  Otherwise normal EKG.  Compared to 08/28/2022, nonspecific T abnormality not as evident in the lateral leads.  Assessment:     ICD-10-CM   1. Coronary artery disease of native artery of native heart with stable angina pectoris (HCC)  I25.118 EKG 12-Lead    2. Asymptomatic bilateral carotid artery stenosis  I65.23 PCV CAROTID DUPLEX (BILATERAL)    3. Controlled type 1 diabetes mellitus with diabetic peripheral angiopathy without gangrene (HCC)  E10.51 rivaroxaban (XARELTO) 2.5 MG TABS tablet    4. Ulcer of left foot with fat layer exposed (HCC)  L97.522 rivaroxaban (XARELTO) 2.5 MG TABS tablet     Meds ordered this encounter  Medications   rivaroxaban (XARELTO) 2.5 MG TABS tablet    Sig: Take 1 tablet (2.5 mg total) by mouth 2 (two) times daily.    Dispense:  180 tablet    Refill:  1   Medications Discontinued During This Encounter  Medication Reason   sulfamethoxazole-trimethoprim (BACTRIM DS) 800-160 MG tablet    amoxicillin (AMOXIL) 500 MG capsule    cilostazol (PLETAL) 50 MG tablet Change in therapy   levofloxacin (LEVAQUIN) 750 MG tablet    cilostazol (PLETAL) 50 MG tablet Change in therapy   Recommendations:   TARA MALISZEWSKI  is a 57 y.o. 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  He has remained abstinent since developing leg ulcer.  1. Coronary artery disease of native artery of native heart with stable angina pectoris [facility]) Patient from cardiac standpoint remained stable, since development of peripheral arterial disease and ulceration, he has completely quit smoking.  He is accompanied by his ex-wife. - EKG 12-Lead  2. Asymptomatic bilateral carotid artery stenosis He has mild asymptomatic bilateral carotid artery stenosis, carotid duplex ordered for surveillance. - PCV CAROTID DUPLEX (BILATERAL); Future  3. Controlled type 1 diabetes mellitus with diabetic peripheral angiopathy without gangrene (HCC) Due to peripheral arterial disease, especially involving small vessel and presence of diabetes mellitus, although diabetes is well-controlled, he is at extreme high risk for limb loss and progression of disease.  I reviewed the angiograms impersonal.  I also reviewed and discussed with the patient regarding the flush occlusion of the posterior tibial artery which supplies the area of interest on his left leg.  Does not appear to have any revascularization options.  I will start him on Xarelto 2.5 mg twice daily and discontinue Pletal.  - rivaroxaban (XARELTO) 2.5 MG TABS tablet; Take 1 tablet (2.5 mg total) by mouth 2 (two) times daily.  Dispense: 180 tablet; Refill: 1  4. Ulcer of left foot with fat layer exposed (HCC) As dictated above, I reviewed the wound pictures but patient has, there is good granulation tissue.  Suspect he  will need at least 4 to 6 weeks more of continued therapy for the wound to heal.  Hopefully addition of Xarelto with continued aspirin 81 mg daily will improve outcomes with regard to wound healing and limb preservation.  I will see him back in 6 months or sooner if problems.  - rivaroxaban (XARELTO) 2.5 MG TABS tablet; Take 1 tablet (2.5 mg total) by mouth 2 (two) times daily.  Dispense: 180 tablet; Refill: 1  Other orders - albuterol (VENTOLIN HFA) 108 (90 Base) MCG/ACT inhaler; Inhale 2 puffs into the lungs every 4 (four) hours as needed. - sulfamethoxazole-trimethoprim (BACTRIM DS) 800-160 MG tablet; Take 1 tablet by mouth 2 (two) times daily. - levofloxacin (LEVAQUIN) 750 MG tablet; Take 750 mg by mouth daily.    Yates Decamp, MD, [facility] 03/24/2023, 3:14 PM Office: [phone] Fax: [phone] Pager: [phone]

## 2023-03-24 NOTE — Progress Notes (Addendum)
HAYDYN, AUBE ([id number]) [id number] Page 1 of 2 Visit Report for 03/24/2023 Arrival Information Details Patient Name: Date of Service: [place], [name] 03/24/2023 7:30 A M Medical Record Number: [id number] Patient Account Number: [id number] Date of Birth/Sex: Treating RN: 04-Apr-1966 (57 y.o. Bayard Hugger, Bonita Quin Primary Care Kieffer Blatz: [name] Other Clinician: [name] Referring Trig Mcbryar: Treating [place] Dowd/Extender: Donnetta Hail in Treatment: 12 Visit Information History Since Last Visit All ordered tests and consults were completed: Yes Patient Arrived: Ambulatory Added or deleted any medications: No Arrival Time: 07:33 Any new allergies or adverse reactions: No Accompanied By: significant other Had a fall or experienced change in No Transfer Assistance: None activities of daily living that may affect Patient Identification Verified: Yes risk of falls: Secondary Verification Process Completed: Yes Signs or symptoms of abuse/neglect since last visito No Patient Requires Transmission-Based Precautions: No Hospitalized since last visit: No Patient Has Alerts: No Implantable device outside of the clinic excluding No cellular tissue based products placed in the center since last visit: Pain Present Now: No Electronic Signature(s) Signed: 03/24/2023 12:15:43 PM By: [name] CHT EMT BS , , Previous Signature: 03/24/2023 8:49:22 AM Version By: [name] CHT EMT BS , , Entered By: [name] on 03/24/2023 12:15:43 -------------------------------------------------------------------------------- Encounter Discharge Information Details Patient Name: Date of Service: [name], [name] 03/24/2023 7:30 A M Medical Record Number: [id number] Patient Account Number: [id number] Date of Birth/Sex: Treating RN: [birth year] (57 y.o. [name] Primary Care Jacquelyn Antony: [name] Other  Clinician: [name] Referring Aradhana Gin: Treating Eliyas Suddreth/Extender: Donnetta Hail in Treatment: 12 Encounter Discharge Information Items Discharge Condition: Stable Ambulatory Status: Ambulatory Discharge Destination: Home Transportation: Private Auto Accompanied By: significant other Schedule Follow-up Appointment: No Clinical Summary of Care: Electronic Signature(s) Signed: 03/24/2023 12:15:22 PM By: [name] CHT EMT BS , , Entered By: [name] on 03/24/2023 12:15:22 [name] ([id number]) [id number] Page 2 of 2 -------------------------------------------------------------------------------- Vitals Details Patient Name: Date of Service: MCEVOY, [name] 03/24/2023 7:30 A M Medical Record Number: [id number] Patient Account Number: [id number] Date of Birth/Sex: Treating RN: 09-Jul-1966 (57 y.o. [name] Primary Care Maxi Carreras: [name] Other Clinician: [name] Referring Maranatha Grossi: Treating Jayona Mccaig/Extender: Donnetta Hail in Treatment: 12 Vital Signs Time Taken: 07:46 Temperature (F): 97.3 Height (in): 68 Pulse (bpm): 72 Weight (lbs): 200 Respiratory Rate (breaths/min): 18 Body Mass Index (BMI): 30.4 Blood Pressure (mmHg): 123/65 Capillary Blood Glucose (mg/dl): 742 Reference Range: 80 - 120 mg / dl Electronic Signature(s) Signed: 03/24/2023 8:50:33 AM By: [name] CHT EMT BS , , Entered By: [name] on 03/24/2023 08:50:33

## 2023-03-24 NOTE — Progress Notes (Addendum)
WYLDER, TRINE ([id number]) [id number] Page 1 of 2 Visit Report for 03/24/2023 HBO Details Patient Name: Date of Service: [place], [name] 03/24/2023 7:30 A M Medical Record Number: [id number] Patient Account Number: [id number] Date of Birth/Sex: Treating RN: February 13, 1966 (57 y.o. [name] Primary Care Linnea Todisco: [name] Other Clinician: [name] Referring Vishal Sandlin: Treating Maeryn Mcgath/Extender: Donnetta Hail in Treatment: 12 HBO Treatment Course Details Treatment Course Number: 1 Ordering Cornellius Kropp: Geralyn Corwin T Treatments Ordered: otal 30 HBO Treatment Start Date: 02/16/2023 HBO Indication: Other (specify in Notes) Notes: Acute peripheral arterial insufficiency HBO Treatment Details Treatment Number: 22 Patient Type: Outpatient Chamber Type: Monoplace Chamber Serial #: [id number] Treatment Protocol: 2.0 ATA with 90 minutes oxygen, with two 5 minute air breaks Treatment Details Compression Rate Down: 1.5 psi / minute De-Compression Rate Up: 2.0 psi / minute A breaks and breathing ir Compress Tx Pressure periods Decompress Decompress Begins Reached (leave unused spaces Begins Ends blank) Chamber Pressure (ATA 1 2 2 2 2 2  --2 1 ) Clock Time (24 hr) 08:03 08:15 08:45 08:50 09:20 09:25 - - 09:55 10:03 Treatment Length: 120 (minutes) Treatment Segments: 4 Vital Signs Capillary Blood Glucose Reference Range: 80 - 120 mg / dl HBO Diabetic Blood Glucose Intervention Range: <131 mg/dl or >782 mg/dl Type: Time Vitals Blood Respiratory Capillary Blood Glucose Pulse Action Pulse: Temperature: Taken: Pressure: Rate: Glucose (mg/dl): Meter #: Oximetry (%) Taken: Pre 07:46 123/65 72 18 97.3 183 none per protocol Post 10:06 132/55 68 18 97.5 224 none per protocol Treatment Response Treatment Toleration: Well Treatment Completion Status: Treatment Completed without Adverse Event Treatment Notes Mr.  Kapaun arrived with normal vital signs stating that he had eaten a sausage cheese biscuit at approximately 0700 and self-administered Lantus at 0630. Patient prepared for treatment. After performing a safety check, he was placed in the chamber which was compressed with 100% oxygen at a rate of 1.8 psi/min after confirming normal ear equalization. He tolerated treatment and subsequent decompression of the chamber at a rate of 2 psi/min. He denied any issues with ear equalization and/or pain associated with barotrauma. His post-treatment vital signs were within normal range. He was stable upon discharge. Physician HBO Attestation: I certify that I supervised this HBO treatment in accordance with Medicare guidelines. A trained emergency response team is readily available per Yes hospital policies and procedures. Continue HBOT as ordered. Yes Electronic Signature(s) Signed: 03/24/2023 3:08:50 PM By: Geralyn Corwin DO Previous Signature: 03/24/2023 12:13:30 PM Version By: [name] CHT EMT BS , , Previous Signature: 03/24/2023 12:11:43 PM Version By: [name] CHT EMT BS , , Previous Signature: 03/24/2023 8:58:11 AM Version By: [name] CHT EMT BS , , Entered By: Geralyn Corwin on 03/24/2023 12:08:08 [name] ([id number]) [id number] Page 2 of 2 -------------------------------------------------------------------------------- HBO Safety Checklist Details Patient Name: Date of Service: [place], [name] 03/24/2023 7:30 A M Medical Record Number: [id number] Patient Account Number: [id number] Date of Birth/Sex: Treating RN: 17-Feb-1966 (57 y.o. [name] Primary Care Maurine Mowbray: [name] Other Clinician: [name] Referring Inri Sobieski: Treating Cherilynn Schomburg/Extender: Donnetta Hail in Treatment: 12 HBO Safety Checklist Items Safety Checklist Consent Form Signed Patient voided / foley secured and  emptied When did you last eato 0700 Last dose of injectable or oral agent 0630 Ostomy pouch emptied and vented if applicable All implantable devices assessed, documented and approved Freestyle Libre 2 and3 on approved list Intravenous access site secured and place Valuables secured Linens and  cotton and cotton/polyester blend (less than 51% polyester) Personal oil-based products / skin lotions / body lotions removed Wigs or hairpieces removed NA Smoking or tobacco materials removed NA Books / newspapers / magazines / loose paper removed Cologne, aftershave, perfume and deodorant removed Jewelry removed (may wrap wedding band) Make-up removed NA Hair care products removed Libre Freestyle 2 and 3 Sensor Approved, other Battery operated devices (external) removed electronics removed. Heating patches and chemical warmers removed Titanium eyewear removed Nail polish cured greater than 10 hours NA Casting material cured greater than 10 hours NA Hearing aids removed NA Loose dentures or partials removed NA Prosthetics have been removed NA Patient demonstrates correct use of air break device (if applicable) Patient concerns have been addressed Patient grounding bracelet on and cord attached to chamber Specifics for Inpatients (complete in addition to above) Medication sheet sent with patient NA Intravenous medications needed or due during therapy sent with patient NA Drainage tubes (e.g. nasogastric tube or chest tube secured and vented) NA Endotracheal or Tracheotomy tube secured NA Cuff deflated of air and inflated with saline NA Airway suctioned NA Notes Paper version used prior to treatment start. Electronic Signature(s) Signed: 03/24/2023 8:52:46 AM By: [name] CHT EMT BS , , Entered By: [name] on 03/24/2023 05:52:46

## 2023-03-24 NOTE — Progress Notes (Signed)
TAVARES, KIYABU ([id number]) [id number] Page 1 of 1 Visit Report for 03/24/2023 SuperBill Details Patient Name: Date of Service: [name], [name] 03/24/2023 Medical Record Number: [id number] Patient Account Number: [id number] Date of Birth/Sex: Treating RN: [birth year] (57 y.o. 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 J44.9 Chronic obstructive pulmonary disease, unspecified M06.9 Rheumatoid arthritis, unspecified Z87.891 Personal history of nicotine dependence Facility Procedures CPT4 Code Description Modifier Quantity [id number] G0277-(Facility Use Only) HBOT full body chamber, , 4 ICD-10 Diagnosis Description 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 S91.302A Unspecified open wound, left foot, initial encounter 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 Physician Procedures Quantity CPT4 Code Description Modifier [id number] - WC PHYS HYPERBARIC OXYGEN THERAPY 1 ICD-10 Diagnosis Description 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

## 2023-03-25 ENCOUNTER — Encounter (HOSPITAL_BASED_OUTPATIENT_CLINIC_OR_DEPARTMENT_OTHER): Payer: 59 | Admitting: Physician Assistant

## 2023-03-25 DIAGNOSIS — S91302A Unspecified open wound, left foot, initial encounter: Secondary | ICD-10-CM | POA: Diagnosis not present

## 2023-03-25 LAB — GLUCOSE, CAPILLARY
Glucose-Capillary: 175 mg/dL — ABNORMAL HIGH (ref 70–99)
Glucose-Capillary: 281 mg/dL — ABNORMAL HIGH (ref 70–99)

## 2023-03-25 NOTE — Progress Notes (Addendum)
KAINON, DOONEY ([id number]) [id number] Page 1 of 2 Visit Report for 03/25/2023 HBO Details Patient Name: Date of Service: [place], [name] 03/25/2023 7:30 A M Medical Record Number: [id number] Patient Account Number: [id number] Date of Birth/Sex: Treating RN: 10-31-1965 (57 y.o. [name] Primary Care Merriam Brandner: [name] Other Clinician: [name] Referring Deondrea Aguado: Treating Mallie Giambra/Extender: [name] in Treatment: 12 HBO Treatment Course Details Treatment Course Number: 1 Ordering Yulian Gosney: Geralyn Corwin T Treatments Ordered: otal 30 HBO Treatment Start Date: 02/16/2023 HBO Indication: Other (specify in Notes) Notes: Acute peripheral arterial insufficiency HBO Treatment Details Treatment Number: 23 Patient Type: Outpatient Chamber Type: Monoplace Chamber Serial #: [id number] Treatment Protocol: 2.0 ATA with 90 minutes oxygen, with two 5 minute air breaks Treatment Details Compression Rate Down: 1.5 psi / minute De-Compression Rate Up: 2.0 psi / minute A breaks and breathing ir Compress Tx Pressure periods Decompress Decompress Begins Reached (leave unused spaces Begins Ends blank) Chamber Pressure (ATA 1 2 2 2 2 2  --2 1 ) Clock Time (24 hr) 08:06 08:17 08:47 08:52 09:22 09:27 - - 09:57 10:00 Treatment Length: 114 (minutes) Treatment Segments: 4 Vital Signs Capillary Blood Glucose Reference Range: 80 - 120 mg / dl HBO Diabetic Blood Glucose Intervention Range: <131 mg/dl or >416 mg/dl Type: Time Vitals Blood Respiratory Capillary Blood Glucose Pulse Action Pulse: Temperature: Taken: Pressure: Rate: Glucose (mg/dl): Meter #: Oximetry (%) Taken: Pre 07:43 114/58 69 18 97.7 175 none per protocol Post 10:03 125/74 75 18 97.8 143 none per protocol Treatment Response Treatment Toleration: Well Treatment Completion Status: Treatment Completed without Adverse Event Treatment Notes Mr.  Ferryman arrived with normal vital signs stating that he had eaten a sausage cheese biscuit and protein drink at approximately 0700 and self-administered Lantus at 0645. Patient prepared for treatment. After performing a safety check, he was placed in the chamber which was compressed with 100% oxygen at a rate of 2 psi/min after confirming normal ear equalization. He tolerated the treatment and the subsequent decompression of the chamber at a rate of 2 psi/min. He denied any issues with ear equalization and/or pain associated with barotrauma. Post treatment vital signs, diastolic BP was 55 mmHg. Patient denied any symptoms related to hypotension. He was stable upon discharge leaving with his significant other. Electronic Signature(s) Signed: 03/25/2023 11:31:13 AM By: [name] CHT EMT BS , , Signed: 03/25/2023 5:16:52 PM By: Allen Derry PA-C Previous Signature: 03/25/2023 9:09:39 AM Version By: [name] CHT EMT BS , , Previous Signature: 03/25/2023 9:46:07 AM Version By: Allen Derry PA-C Entered By: [name] on 03/25/2023 08:31:13 Haberer, [name] ([id number]) [id number] Page 2 of 2 -------------------------------------------------------------------------------- HBO Safety Checklist Details Patient Name: Date of Service: [place], [name] 03/25/2023 7:30 A M Medical Record Number: [id number] Patient Account Number: [id number] Date of Birth/Sex: Treating RN: 10-Mar-1966 (57 y.o. [name] Primary Care Brenan Modesto: [name] Other Clinician: [name] Referring Fontella Shan: Treating Ryshawn Sanzone/Extender: [name] in Treatment: 12 HBO Safety Checklist Items Safety Checklist Consent Form Signed Patient voided / foley secured and emptied When did you last eato 0700 Sau and Egg Sandwich/Protein drink Last dose of injectable or oral agent 0645 17 units Lantus Ostomy pouch emptied and vented if  applicable NA All implantable devices assessed, documented and approved Freestyle Libre 2 and3 on approved list Intravenous access site secured and place NA Valuables secured Linens and cotton and cotton/polyester blend (less than 51% polyester) Personal oil-based products /  skin lotions / body lotions removed Wigs or hairpieces removed NA Smoking or tobacco materials removed NA Books / newspapers / magazines / loose paper removed Cologne, aftershave, perfume and deodorant removed Jewelry removed (may wrap wedding band) Make-up removed NA Hair care products removed Libre Freestyle 2 and 3 Sensor Approved, other Battery operated devices (external) removed electronics removed. Heating patches and chemical warmers removed Titanium eyewear removed Nail polish cured greater than 10 hours NA Casting material cured greater than 10 hours NA Hearing aids removed NA Loose dentures or partials removed NA Prosthetics have been removed NA Patient demonstrates correct use of air break device (if applicable) Patient concerns have been addressed Patient grounding bracelet on and cord attached to chamber Specifics for Inpatients (complete in addition to above) Medication sheet sent with patient NA Intravenous medications needed or due during therapy sent with patient NA Drainage tubes (e.g. nasogastric tube or chest tube secured and vented) NA Endotracheal or Tracheotomy tube secured NA Cuff deflated of air and inflated with saline NA Airway suctioned NA Notes Paper version used prior to treatment start. Electronic Signature(s) Signed: 03/25/2023 9:06:05 AM By: [name] CHT EMT BS , , Entered By: [name] on 03/25/2023 06:06:05

## 2023-03-25 NOTE — Progress Notes (Addendum)
[name], TEAFF ([id number]) [id number] Page 1 of 2 Visit Report for 03/25/2023 Arrival Information Details Patient Name: Date of Service: [name], [name] 03/25/2023 7:30 A M Medical Record Number: [id number] Patient Account Number: [id number] Date of Birth/Sex: Treating RN: [birth year] (57 y.o. [name] Primary Care Rabecca Birge: [name] Other Clinician: Karl Bales Referring Marquel Spoto: Treating Selenia Mihok/Extender: [name] in Treatment: 12 Visit Information History Since Last Visit All ordered tests and consults were completed: Yes Patient Arrived: Ambulatory Added or deleted any medications: No Arrival Time: 07:40 Any new allergies or adverse reactions: No Accompanied By: significant other Had a fall or experienced change in No Transfer Assistance: None activities of daily living that may affect Patient Identification Verified: Yes risk of falls: Secondary Verification Process Completed: Yes Signs or symptoms of abuse/neglect since last visito No Patient Requires Transmission-Based Precautions: No Hospitalized since last visit: No Patient Has Alerts: No Implantable device outside of the clinic excluding No cellular tissue based products placed in the center since last visit: Pain Present Now: No Electronic Signature(s) Signed: 03/25/2023 9:01:51 AM By: [name] CHT EMT BS , , Entered By: [name] on 03/25/2023 06:01:50 -------------------------------------------------------------------------------- Encounter Discharge Information Details Patient Name: Date of Service: [name] [place], [name] 03/25/2023 7:30 A M Medical Record Number: [id number] Patient Account Number: [id number] Date of Birth/Sex: Treating RN: 1966-01-08 (57 y.o. [name] Primary Care Ben Sanz: [name] Other Clinician: [name] Referring Shalee Paolo: Treating Konstantinos Cordoba/Extender: [name] in Treatment: 12 Encounter Discharge Information Items Discharge Condition: Stable Ambulatory Status: Ambulatory Discharge Destination: Home Transportation: Private Auto Accompanied By: significant other Schedule Follow-up Appointment: No Clinical Summary of Care: Electronic Signature(s) Signed: 03/25/2023 11:32:31 AM By: [name] CHT EMT BS , , Entered By: [name] on 03/25/2023 08:32:31 [name] ([id number]) [id number] Page 2 of 2 -------------------------------------------------------------------------------- Vitals Details Patient Name: Date of Service: [name], [name] 03/25/2023 7:30 A M Medical Record Number: [id number] Patient Account Number: [id number] Date of Birth/Sex: Treating RN: 1966-04-20 (57 y.o. [name] Primary Care Chayce Robbins: [name] Other Clinician: Karl Bales Referring Marijo Quizon: Treating Kadir Azucena/Extender: [name] in Treatment: 12 Vital Signs Time Taken: 07:43 Temperature (F): 97.7 Height (in): 68 Pulse (bpm): 69 Weight (lbs): 200 Respiratory Rate (breaths/min): 18 Body Mass Index (BMI): 30.4 Blood Pressure (mmHg): 114/58 Capillary Blood Glucose (mg/dl): 433 Reference Range: 80 - 120 mg / dl Electronic Signature(s) Signed: 03/25/2023 9:02:28 AM By: [name] CHT EMT BS , , Entered By: [name] on 03/25/2023 29:51:88

## 2023-03-25 NOTE — Progress Notes (Signed)
TREYSEN, OTTUM ([id number]) [id number] Page 1 of 1 Visit Report for 03/12/2023 SuperBill Details Patient Name: Date of Service: [place], [name] 03/12/2023 Medical Record Number: [id number] Patient Account Number: [id number] Date of Birth/Sex: Treating RN: Dec 09, 1965 (57 y.o. 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 J44.9 Chronic obstructive pulmonary disease, unspecified M06.9 Rheumatoid arthritis, unspecified Z87.891 Personal history of nicotine dependence Facility Procedures CPT4 Code Description Modifier Quantity [id number] G0277-(Facility Use Only) HBOT full body chamber, , 4 ICD-10 Diagnosis Description 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 S91.302A Unspecified open wound, left foot, initial encounter 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 Physician Procedures Quantity CPT4 Code Description Modifier [id number] 99183 - WC PHYS HYPERBARIC OXYGEN THERAPY 1 ICD-10 Diagnosis Description 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

## 2023-03-25 NOTE — Progress Notes (Signed)
BOEN, MALATESTA ([id number]) [id number] Page 1 of 1 Visit Report for 03/25/2023 SuperBill Details Patient Name: Date of Service: [name], [name] 03/25/2023 Medical Record Number: [id number] Patient Account Number: [id number] Date of Birth/Sex: Treating RN: 30-Dec-1965 (57 y.o. 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 J44.9 Chronic obstructive pulmonary disease, unspecified M06.9 Rheumatoid arthritis, unspecified Z87.891 Personal history of nicotine dependence Facility Procedures CPT4 Code Description Modifier Quantity [id number] G0277-(Facility Use Only) HBOT full body chamber, , 4 ICD-10 Diagnosis Description 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 S91.302A Unspecified open wound, left foot, initial encounter 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 Physician Procedures Quantity CPT4 Code Description Modifier [id number] 99183 - WC PHYS HYPERBARIC OXYGEN THERAPY 1 ICD-10 Diagnosis Description 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

## 2023-03-26 ENCOUNTER — Encounter (HOSPITAL_BASED_OUTPATIENT_CLINIC_OR_DEPARTMENT_OTHER): Payer: 59 | Admitting: Internal Medicine

## 2023-03-26 DIAGNOSIS — E10621 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: Secondary | ICD-10-CM

## 2023-03-26 DIAGNOSIS — S91302A Unspecified open wound, left foot, initial encounter: Secondary | ICD-10-CM

## 2023-03-26 DIAGNOSIS — L97528 Non-pressure chronic ulcer of other part of left foot with other specified severity: Secondary | ICD-10-CM | POA: Diagnosis not present

## 2023-03-26 DIAGNOSIS — I70245 Atherosclerosis of native arteries of left leg with ulceration of other part of foot: Secondary | ICD-10-CM | POA: Diagnosis not present

## 2023-03-26 LAB — GLUCOSE, CAPILLARY
Glucose-Capillary: 168 mg/dL — ABNORMAL HIGH (ref 70–99)
Glucose-Capillary: 208 mg/dL — ABNORMAL HIGH (ref 70–99)

## 2023-03-26 NOTE — Progress Notes (Addendum)
MARVINS, [name] ([id number]) [id number] Page 1 of 2 Visit Report for 03/26/2023 HBO Details Patient Name: Date of Service: [name], [name] 03/26/2023 1:00 PM Medical Record Number: [id number] Patient Account Number: [id number] Date of Birth/Sex: Treating RN: 12/08/1965 (57 y.o. Valma Cava Primary Care Fateh Kindle: [name] Other Clinician: [name] Referring Marialice Newkirk: Treating Ayaan Ringle/Extender: Donnetta Hail in Treatment: 13 HBO Treatment Course Details Treatment Course Number: 1 Ordering Bryon Parker: Geralyn Corwin T Treatments Ordered: otal 30 HBO Treatment Start Date: 02/16/2023 HBO Indication: Other (specify in Notes) Notes: Acute peripheral arterial insufficiency HBO Treatment Details Treatment Number: 24 Patient Type: Outpatient Chamber Type: Monoplace Chamber Serial #: [id number] Treatment Protocol: 2.0 ATA with 90 minutes oxygen, with two 5 minute air breaks Treatment Details Compression Rate Down: 1.5 psi / minute De-Compression Rate Up: 2.0 psi / minute A breaks and breathing ir Compress Tx Pressure periods Decompress Decompress Begins Reached (leave unused spaces Begins Ends blank) Chamber Pressure (ATA 1 2 2 2 2 2  --2 1 ) Clock Time (24 hr) 13:31 13:42 14:12 14:17 14:47 14:52 - - 15:22 15:30 Treatment Length: 119 (minutes) Treatment Segments: 4 Vital Signs Capillary Blood Glucose Reference Range: 80 - 120 mg / dl HBO Diabetic Blood Glucose Intervention Range: <131 mg/dl or >387 mg/dl Type: Time Vitals Blood Respiratory Capillary Blood Glucose Pulse Action Pulse: Temperature: Taken: Pressure: Rate: Glucose (mg/dl): Meter #: Oximetry (%) Taken: Pre 13:08 121/53 74 18 98.2 168 none per protocol Post 15:35 120/71 64 18 97.9 208 none per protocol Treatment Response Treatment Toleration: Well Treatment Completion Status: Treatment Completed without Adverse Event Treatment Notes [name]  arrived and prepared for treatment. His vital signs were within normal range. After performing a safety check, he was placed in the chamber which was compressed with 100% oxygen at a rate of 2 psi/min. He tolerated the treatment and subsequent decompression of the chamber at a rate of 2 psi/min. He denied any issues with ear equalization and/or pain related to barotrauma. Post-treatment vital signs were within normal range. He was stable upon discharge. Physician HBO Attestation: I certify that I supervised this HBO treatment in accordance with Medicare guidelines. A trained emergency response team is readily available per Yes hospital policies and procedures. Continue HBOT as ordered. Yes Electronic Signature(s) Signed: 03/30/2023 1:38:10 PM By: Geralyn Corwin DO Previous Signature: 03/26/2023 4:16:40 PM Version By: [name] CHT EMT BS , , Previous Signature: 03/26/2023 4:16:19 PM Version By: [name] CHT EMT BS , , Previous Signature: 03/26/2023 2:26:05 PM Version By: [name] CHT EMT BS , , Entered By: Geralyn Corwin on 03/30/2023 10:37:09 [name] ([id number]) [id number] Page 2 of 2 -------------------------------------------------------------------------------- HBO Safety Checklist Details Patient Name: Date of Service: [name], [name] 03/26/2023 1:00 PM Medical Record Number: [id number] Patient Account Number: [id number] Date of Birth/Sex: Treating RN: [birth year] (57 y.o. Valma Cava Primary Care Arlin Sass: [name] Other Clinician: [name] Referring Belkis Norbeck: Treating Debany Vantol/Extender: Donnetta Hail in Treatment: 13 HBO Safety Checklist Items Safety Checklist Consent Form Signed Patient voided / foley secured and emptied When did you last eato 1300 Last dose of injectable or oral agent 1100 - 17 units Lantus Ostomy pouch emptied and vented if applicable NA All  implantable devices assessed, documented and approved Freestyle Libre 2 and3 on approved list Intravenous access site secured and place NA Valuables secured Linens and cotton and cotton/polyester blend (less than 51% polyester) Personal oil-based products / skin lotions /  body lotions removed Wigs or hairpieces removed NA Smoking or tobacco materials removed NA Books / newspapers / magazines / loose paper removed Cologne, aftershave, perfume and deodorant removed Jewelry removed (may wrap wedding band) Make-up removed NA Hair care products removed Libre Freestyle 2 and 3 Sensor Approved, other Battery operated devices (external) removed electronics removed. Heating patches and chemical warmers removed Titanium eyewear removed Nail polish cured greater than 10 hours NA Casting material cured greater than 10 hours NA Hearing aids removed NA Loose dentures or partials removed NA Prosthetics have been removed NA Patient demonstrates correct use of air break device (if applicable) Patient concerns have been addressed Patient grounding bracelet on and cord attached to chamber Specifics for Inpatients (complete in addition to above) Medication sheet sent with patient NA Intravenous medications needed or due during therapy sent with patient NA Drainage tubes (e.g. nasogastric tube or chest tube secured and vented) NA Endotracheal or Tracheotomy tube secured NA Cuff deflated of air and inflated with saline NA Airway suctioned NA Notes Paper version used prior to treatment start. Electronic Signature(s) Signed: 03/26/2023 4:20:04 PM By: [name] CHT EMT BS , , Previous Signature: 03/26/2023 2:23:06 PM Version By: [name] CHT EMT BS , , Entered By: [name] on 03/26/2023 13:20:04

## 2023-03-26 NOTE — Progress Notes (Addendum)
[name], [name] ([id number]) [id number] Page 1 of 2 Visit Report for 03/26/2023 Arrival Information Details Patient Name: Date of Service: [name], [name] 03/26/2023 1:00 PM Medical Record Number: [id number] Patient Account Number: [id number] Date of Birth/Sex: Treating RN: [birth year] (57 y.o. [name] Primary Care [name]: [name] Other Clinician: [name] Referring [name]: Treating [name]/Extender: Donnetta Hail in Treatment: 13 Visit Information History Since Last Visit All ordered tests and consults were completed: Yes Patient Arrived: Ambulatory Added or deleted any medications: No Arrival Time: 13:00 Any new allergies or adverse reactions: No Accompanied By: significant other Had a fall or experienced change in No Transfer Assistance: None activities of daily living that may affect Patient Identification Verified: Yes risk of falls: Secondary Verification Process Completed: Yes Signs or symptoms of abuse/neglect since last visito No Patient Requires Transmission-Based Precautions: No Hospitalized since last visit: No Patient Has Alerts: No Implantable device outside of the clinic excluding No cellular tissue based products placed in the center since last visit: Pain Present Now: No Electronic Signature(s) Signed: 03/26/2023 4:19:31 PM By: [name] CHT EMT BS , , Previous Signature: 03/26/2023 2:17:20 PM Version By: [name] CHT EMT BS , , Entered By: [name] on 03/26/2023 13:19:30 -------------------------------------------------------------------------------- Encounter Discharge Information Details Patient Name: Date of Service: [name], [name] [name] J. 03/26/2023 1:00 PM Medical Record Number: [id number] Patient Account Number: [id number] Date of Birth/Sex: Treating RN: 1966-03-05 (57 y.o. [name] Primary Care Nicey Krah: [name] Other  Clinician: [name] Referring [name]: Treating [name]/Extender: Donnetta Hail in Treatment: 13 Encounter Discharge Information Items Discharge Condition: Stable Ambulatory Status: Ambulatory Discharge Destination: Home Transportation: Private Auto Accompanied By: significant other Schedule Follow-up Appointment: No Clinical Summary of Care: Electronic Signature(s) Signed: 03/26/2023 4:19:11 PM By: [name] CHT EMT BS , , Entered By: [name] on 03/26/2023 13:19:11 [name], [name] ([id number]) [id number] Page 2 of 2 -------------------------------------------------------------------------------- Vitals Details Patient Name: Date of Service: [name], [name] 03/26/2023 1:00 PM Medical Record Number: [id number] Patient Account Number: [id number] Date of Birth/Sex: Treating RN: [birth year] (57 y.o. [name] Primary Care [name]: [name] Other Clinician: [name] Referring Dezaree Tracey: Treating [name]/Extender: Donnetta Hail in Treatment: 13 Vital Signs Time Taken: 13:08 Temperature (F): 98.2 Height (in): 68 Pulse (bpm): 74 Weight (lbs): 200 Respiratory Rate (breaths/min): 18 Body Mass Index (BMI): 30.4 Blood Pressure (mmHg): 121/53 Capillary Blood Glucose (mg/dl): 536 Reference Range: 80 - 120 mg / dl Electronic Signature(s) Signed: 03/26/2023 2:21:00 PM By: [name] CHT EMT BS , , Entered By: [name] on 03/26/2023 11:21:00

## 2023-03-27 ENCOUNTER — Encounter (HOSPITAL_BASED_OUTPATIENT_CLINIC_OR_DEPARTMENT_OTHER): Payer: 59 | Admitting: General Surgery

## 2023-03-27 DIAGNOSIS — S91302A Unspecified open wound, left foot, initial encounter: Secondary | ICD-10-CM | POA: Diagnosis not present

## 2023-03-27 LAB — GLUCOSE, CAPILLARY
Glucose-Capillary: 203 mg/dL — ABNORMAL HIGH (ref 70–99)
Glucose-Capillary: 189 mg/dL — ABNORMAL HIGH (ref 70–99)

## 2023-03-27 NOTE — Progress Notes (Addendum)
KHYREN, MISKO ([id number]) [id number] Page 1 of 2 Visit Report for 03/27/2023 Arrival Information Details Patient Name: Date of Service: [place], [name] 03/27/2023 7:30 A M Medical Record Number: [id number] Patient Account Number: [id number] Date of Birth/Sex: Treating RN: [birth year] (57 y.o. [name] Primary Care Shaylon Gillean: [name] Other Clinician: [name] Referring Sadeel Fiddler: Treating Abreanna Drawdy/Extender: Marena Chancy in Treatment: 13 Visit Information History Since Last Visit All ordered tests and consults were completed: Yes Patient Arrived: Ambulatory Added or deleted any medications: No Arrival Time: 08:12 Any new allergies or adverse reactions: No Accompanied By: self Had a fall or experienced change in No Transfer Assistance: None activities of daily living that may affect Patient Identification Verified: Yes risk of falls: Secondary Verification Process Completed: Yes Signs or symptoms of abuse/neglect since last visito No Patient Requires Transmission-Based Precautions: No Hospitalized since last visit: No Patient Has Alerts: No Implantable device outside of the clinic excluding No cellular tissue based products placed in the center since last visit: Pain Present Now: No Electronic Signature(s) Signed: 03/27/2023 9:32:46 AM By: [name] CHT EMT BS , , Entered By: [name] on 03/27/2023 06:32:46 -------------------------------------------------------------------------------- Encounter Discharge Information Details Patient Name: Date of Service: [name], [name] 03/27/2023 7:30 A M Medical Record Number: [id number] Patient Account Number: [id number] Date of Birth/Sex: Treating RN: 1966/08/01 (57 y.o. [name] Primary Care Irie Fiorello: [name] Other Clinician: [name] Referring Rawson Minix: Treating Phaedra Colgate/Extender: Marena Chancy in Treatment: 13 Encounter Discharge Information Items Discharge Condition: Stable Ambulatory Status: Ambulatory Discharge Destination: Home Transportation: Private Auto Accompanied By: self Schedule Follow-up Appointment: No Clinical Summary of Care: Electronic Signature(s) Signed: 03/27/2023 11:26:46 AM By: [name] CHT EMT BS , , Entered By: [name] on 03/27/2023 08:26:46 [name] ([id number]) [id number] Page 2 of 2 -------------------------------------------------------------------------------- Vitals Details Patient Name: Date of Service: [place], [name] 03/27/2023 7:30 A M Medical Record Number: [id number] Patient Account Number: [id number] Date of Birth/Sex: Treating RN: 11-11-1965 (57 y.o. [name] Primary Care Rakesha Dalporto: [name] Other Clinician: [name] Referring Jazzalynn Rhudy: Treating Jajaira Ruis/Extender: Marena Chancy in Treatment: 13 Vital Signs Time Taken: 08:15 Temperature (F): 97.3 Height (in): 68 Pulse (bpm): 69 Weight (lbs): 200 Respiratory Rate (breaths/min): 18 Body Mass Index (BMI): 30.4 Blood Pressure (mmHg): 128/55 Capillary Blood Glucose (mg/dl): 301 Reference Range: 80 - 120 mg / dl Electronic Signature(s) Signed: 03/27/2023 9:33:59 AM By: [name] CHT EMT BS , , Entered By: [name] on 03/27/2023 06:33:59

## 2023-03-27 NOTE — Progress Notes (Signed)
[name], [name] ([id number]) [id number] Page 1 of 1 Visit Report for 03/27/2023 SuperBill Details Patient Name: Date of Service: [place], [name] 03/27/2023 Medical Record Number: [id number] Patient Account Number: [id number] Date of Birth/Sex: Treating RN: [birth year] (57 y.o. 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 J44.9 Chronic obstructive pulmonary disease, unspecified M06.9 Rheumatoid arthritis, unspecified Z87.891 Personal history of nicotine dependence Facility Procedures CPT4 Code Description Modifier Quantity [id number] G0277-(Facility Use Only) HBOT full body chamber, , 4 ICD-10 Diagnosis Description 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 S91.302A Unspecified open wound, left foot, initial encounter 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 Physician Procedures Quantity CPT4 Code Description Modifier [id number] - WC PHYS HYPERBARIC OXYGEN THERAPY 1 ICD-10 Diagnosis Description 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

## 2023-03-27 NOTE — Progress Notes (Addendum)
[name], [name] ([id number]) [id number] Page 1 of 2 Visit Report for 03/27/2023 HBO Details Patient Name: Date of Service: [name], [name] 03/27/2023 7:30 A M Medical Record Number: [id number] Patient Account Number: [id number] Date of Birth/Sex: Treating RN: 02-29-1966 (57 y.o. [name] Primary Care Yariel Ferraris: [name] Other Clinician: [name] Referring Joao Mccurdy: Treating Vrishank Moster/Extender: Marena Chancy in Treatment: 13 HBO Treatment Course Details Treatment Course Number: 1 Ordering Maniyah Moller: Geralyn Corwin T Treatments Ordered: otal 30 HBO Treatment Start Date: 02/16/2023 HBO Indication: Other (specify in Notes) Notes: Acute peripheral arterial insufficiency HBO Treatment Details Treatment Number: 25 Patient Type: Outpatient Chamber Type: Monoplace Chamber Serial #: [id number] Treatment Protocol: 2.0 ATA with 90 minutes oxygen, with two 5 minute air breaks Treatment Details Compression Rate Down: 1.5 psi / minute De-Compression Rate Up: A breaks and breathing ir Compress Tx Pressure periods Decompress Decompress Begins Reached (leave unused spaces Begins Ends blank) Chamber Pressure (ATA 1 2 2 2 2 2  --2 1 ) Clock Time (24 hr) 08:28 08:40 09:10 09:15 09:45 09:50 - - 10:20 10:28 Treatment Length: 120 (minutes) Treatment Segments: 4 Vital Signs Capillary Blood Glucose Reference Range: 80 - 120 mg / dl HBO Diabetic Blood Glucose Intervention Range: <131 mg/dl or >086 mg/dl Type: Time Vitals Blood Respiratory Capillary Blood Glucose Pulse Action Pulse: Temperature: Taken: Pressure: Rate: Glucose (mg/dl): Meter #: Oximetry (%) Taken: Pre 08:15 128/55 69 18 97.3 203 none per protocol Post 10:31 113/67 66 18 97.9 189 none per protocol Treatment Response Treatment Toleration: Well Treatment Completion Status: Treatment Completed without Adverse Event Treatment Notes Mr. Lesko arrived and  prepared for treatment. His vital signs were within normal range. After performing a safety check, he was placed in the chamber which was compressed with 100% oxygen at a rate of 2 psi/min. He tolerated the treatment and subsequent decompression of the chamber at a rate of 2 psi/min. He denied any issues with ear equalization and/or pain related to barotrauma. Post-treatment vital signs were within normal range. He was stable upon discharge. Physician HBO Attestation: I certify that I supervised this HBO treatment in accordance with Medicare guidelines. A trained emergency response team is readily available per Yes hospital policies and procedures. Continue HBOT as ordered. Yes Electronic Signature(s) Signed: 03/27/2023 12:37:36 PM By: Duanne Guess MD FACS Previous Signature: 03/27/2023 11:23:55 AM Version By: [name] CHT EMT BS , , Previous Signature: 03/27/2023 9:37:16 AM Version By: [name] CHT EMT BS , , Entered By: Duanne Guess on 03/27/2023 09:37:36 [name], [name] ([id number]) [id number] Page 2 of 2 -------------------------------------------------------------------------------- HBO Safety Checklist Details Patient Name: Date of Service: [name], [name] 03/27/2023 7:30 A M Medical Record Number: [id number] Patient Account Number: [id number] Date of Birth/Sex: Treating RN: Apr 08, 1966 (57 y.o. [name] Primary Care Deaisa Merida: [name] Other Clinician: [name] Referring Keyasha Miah: Treating Sander Remedios/Extender: Marena Chancy in Treatment: 13 HBO Safety Checklist Items Safety Checklist Consent Form Signed Patient voided / foley secured and emptied When did you last eato 0700 - Sausage Egg Biscuit with Orang Juice Last dose of injectable or oral agent 0700 Ostomy pouch emptied and vented if applicable NA All implantable devices assessed, documented and approved Freestyle Libre  2 and3 on approved list Intravenous access site secured and place NA Valuables secured Linens and cotton and cotton/polyester blend (less than 51% polyester) Personal oil-based products / skin lotions / body lotions removed Wigs or hairpieces removed NA Smoking or tobacco materials  removed NA Books / newspapers / magazines / loose paper removed Cologne, aftershave, perfume and deodorant removed Jewelry removed (may wrap wedding band) Make-up removed NA Hair care products removed Battery operated devices (external) removed Heating patches and chemical warmers removed Titanium eyewear removed Nail polish cured greater than 10 hours NA Casting material cured greater than 10 hours NA Hearing aids removed NA Loose dentures or partials removed NA Prosthetics have been removed NA Patient demonstrates correct use of air break device (if applicable) Patient concerns have been addressed Patient grounding bracelet on and cord attached to chamber Specifics for Inpatients (complete in addition to above) Medication sheet sent with patient NA Intravenous medications needed or due during therapy sent with patient NA Drainage tubes (e.g. nasogastric tube or chest tube secured and vented) NA Endotracheal or Tracheotomy tube secured NA Cuff deflated of air and inflated with saline NA Airway suctioned NA Notes Paper version used prior to treatment start. Electronic Signature(s) Signed: 03/27/2023 9:36:19 AM By: [name] CHT EMT BS , , Entered By: [name] on 03/27/2023 06:36:19

## 2023-03-30 ENCOUNTER — Encounter (HOSPITAL_BASED_OUTPATIENT_CLINIC_OR_DEPARTMENT_OTHER): Payer: 59 | Admitting: Internal Medicine

## 2023-03-30 DIAGNOSIS — E10621 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: Secondary | ICD-10-CM | POA: Diagnosis not present

## 2023-03-30 DIAGNOSIS — S91302A Unspecified open wound, left foot, initial encounter: Secondary | ICD-10-CM | POA: Diagnosis not present

## 2023-03-30 DIAGNOSIS — L97528 Non-pressure chronic ulcer of other part of left foot with other specified severity: Secondary | ICD-10-CM | POA: Diagnosis not present

## 2023-03-30 DIAGNOSIS — I70245 Atherosclerosis of native arteries of left leg with ulceration of other part of foot: Secondary | ICD-10-CM | POA: Diagnosis not present

## 2023-03-30 LAB — GLUCOSE, CAPILLARY
Glucose-Capillary: 167 mg/dL — ABNORMAL HIGH (ref 70–99)
Glucose-Capillary: 128 mg/dL — ABNORMAL HIGH (ref 70–99)

## 2023-03-30 NOTE — Progress Notes (Signed)
[name], [name] ([id number]) [id number] Page 1 of 1 Visit Report for 03/30/2023 SuperBill Details Patient Name: Date of Service: [place], [name] 03/30/2023 Medical Record Number: [id number] Patient Account Number: [id number] Date of Birth/Sex: Treating RN: [birth year] (57 y.o. 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 J44.9 Chronic obstructive pulmonary disease, unspecified M06.9 Rheumatoid arthritis, unspecified Z87.891 Personal history of nicotine dependence Facility Procedures CPT4 Code Description Modifier Quantity [id number] G0277-(Facility Use Only) HBOT full body chamber, , 4 ICD-10 Diagnosis Description 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 S91.302A Unspecified open wound, left foot, initial encounter 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 Physician Procedures Quantity CPT4 Code Description Modifier [id number] 99183 - WC PHYS HYPERBARIC OXYGEN THERAPY 1 ICD-10 Diagnosis Description 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

## 2023-03-30 NOTE — Progress Notes (Addendum)
BELFORD, CASCANTE ([id number]) [id number] Page 1 of 2 Visit Report for 03/30/2023 Arrival Information Details Patient Name: Date of Service: [place], [name] 03/30/2023 7:30 A M Medical Record Number: [id number] Patient Account Number: [id number] Date of Birth/Sex: Treating RN: October 14, 1965 (57 y.o. Bayard Hugger, Bonita Quin Primary Care Suri Tafolla: [name] Other Clinician: [name] Referring Janelie Goltz: Treating Tedric Leeth/Extender: Donnetta Hail in Treatment: 13 Visit Information History Since Last Visit All ordered tests and consults were completed: Yes Patient Arrived: Ambulatory Added or deleted any medications: No Arrival Time: 07:32 Any new allergies or adverse reactions: No Accompanied By: self Had a fall or experienced change in No Transfer Assistance: None activities of daily living that may affect Patient Identification Verified: Yes risk of falls: Secondary Verification Process Completed: Yes Signs or symptoms of abuse/neglect since last visito No Patient Requires Transmission-Based Precautions: No Hospitalized since last visit: No Patient Has Alerts: No Implantable device outside of the clinic excluding No cellular tissue based products placed in the center since last visit: Pain Present Now: No Electronic Signature(s) Signed: 03/30/2023 8:49:05 AM By: [name] CHT EMT BS , , Entered By: [name] on 03/30/2023 05:49:05 -------------------------------------------------------------------------------- Encounter Discharge Information Details Patient Name: Date of Service: [name], [name] 03/30/2023 7:30 A M Medical Record Number: [id number] Patient Account Number: [id number] Date of Birth/Sex: Treating RN: 09-01-1965 (57 y.o. [name] Primary Care Keiaira Donlan: [name] Other Clinician: [name] Referring Jaquetta Currier: Treating Doristine Shehan/Extender: Donnetta Hail in Treatment: 13 Encounter Discharge Information Items Discharge Condition: Stable Ambulatory Status: Ambulatory Discharge Destination: Home Transportation: Private Auto Accompanied By: self Schedule Follow-up Appointment: No Clinical Summary of Care: Electronic Signature(s) Signed: 03/30/2023 12:03:16 PM By: [name] CHT EMT BS , , Entered By: [name] on 03/30/2023 09:03:16 Remedios, [name] ([id number]) [id number] Page 2 of 2 -------------------------------------------------------------------------------- Vitals Details Patient Name: Date of Service: STRZELCZYK, [name] 03/30/2023 7:30 A M Medical Record Number: [id number] Patient Account Number: [id number] Date of Birth/Sex: Treating RN: 15-Feb-1966 (57 y.o. [name] Primary Care Kenosha Doster: [name] Other Clinician: [name] Referring Candie Gintz: Treating Kamisha Ell/Extender: Donnetta Hail in Treatment: 13 Vital Signs Time Taken: 07:44 Temperature (F): 97.2 Height (in): 68 Pulse (bpm): 68 Weight (lbs): 200 Respiratory Rate (breaths/min): 18 Body Mass Index (BMI): 30.4 Blood Pressure (mmHg): 130/62 Capillary Blood Glucose (mg/dl): 283 Reference Range: 80 - 120 mg / dl Electronic Signature(s) Signed: 03/30/2023 8:49:39 AM By: [name] CHT EMT BS , , Entered By: [name] on 03/30/2023 05:49:39

## 2023-03-30 NOTE — Progress Notes (Signed)
KNOX, GANTNER ([id number]) [id number] Page 1 of 1 Visit Report for 03/26/2023 SuperBill Details Patient Name: Date of Service: T[place], [name] 03/26/2023 Medical Record Number: [id number] Patient Account Number: [id number] Date of Birth/Sex: Treating RN: [birth year] (57 y.o. 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 J44.9 Chronic obstructive pulmonary disease, unspecified M06.9 Rheumatoid arthritis, unspecified Z87.891 Personal history of nicotine dependence Facility Procedures CPT4 Code Description Modifier Quantity [id number] G0277-(Facility Use Only) HBOT full body chamber, , 4 ICD-10 Diagnosis Description 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 S91.302A Unspecified open wound, left foot, initial encounter 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 Physician Procedures Quantity CPT4 Code Description Modifier [id number] - WC PHYS HYPERBARIC OXYGEN THERAPY 1 ICD-10 Diagnosis Description 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

## 2023-03-30 NOTE — Progress Notes (Addendum)
[name], [name] ([id number]) [id number] Page 1 of 2 Visit Report for 03/30/2023 HBO Details Patient Name: Date of Service: [name], [name] 03/30/2023 7:30 A M Medical Record Number: [id number] Patient Account Number: [id number] Date of Birth/Sex: Treating RN: 09-Sep-1965 (57 y.o. [name] Primary Care [name]: [name] Other Clinician: Haywood [name] Referring [name]: Treating [name]/Extender: [name] in Treatment: 13 HBO Treatment Course Details Treatment Course Number: 1 Ordering [name]: [name] T Treatments Ordered: otal 30 HBO Treatment Start Date: 02/16/2023 HBO Indication: Other (specify in Notes) Notes: Acute peripheral arterial insufficiency HBO Treatment Details Treatment Number: 26 Patient Type: Outpatient Chamber Type: Monoplace Chamber Serial #: [id number] Treatment Protocol: 2.0 ATA with 90 minutes oxygen, with two 5 minute air breaks Treatment Details Compression Rate Down: 1.5 psi / minute De-Compression Rate Up: 2.0 psi / minute A breaks and breathing ir Compress Tx Pressure periods Decompress Decompress Begins Reached (leave unused spaces Begins Ends blank) Chamber Pressure (ATA 1 2 2 2 2 2  --2 1 ) Clock Time (24 hr) 08:01 08:11 08:41 08:46 09:16 09:21 - - 09:51 10:00 Treatment Length: 119 (minutes) Treatment Segments: 4 Vital Signs Capillary Blood Glucose Reference Range: 80 - 120 mg / dl HBO Diabetic Blood Glucose Intervention Range: <131 mg/dl or >130 mg/dl Type: Time Vitals Blood Respiratory Capillary Blood Glucose Pulse Action Pulse: Temperature: Taken: Pressure: Rate: Glucose (mg/dl): Meter #: Oximetry (%) Taken: Pre 07:44 130/62 68 18 97.2 167 none per protocol Post 10:03 112/61 66 18 97.2 128 none per protocol Treatment Response Treatment Toleration: Well Treatment Completion Status: Treatment Completed without Adverse Event Treatment Notes Mr.  Massett arrived and prepared for treatment. He stated that he ate breakfast and has been stable in his blood glucose control during treatments. His vital signs were within normal range. After performing a safety check, he was placed in the chamber which was compressed with 100% oxygen at a rate of 2 psi/min. He tolerated the treatment and subsequent decompression of the chamber at a rate of 2 psi/min. He denied any issues with ear equalization and/or pain related to barotrauma. Post-treatment vital signs were within normal range. He was stable upon discharge. Physician HBO Attestation: I certify that I supervised this HBO treatment in accordance with Medicare guidelines. A trained emergency response team is readily available per Yes hospital policies and procedures. Continue HBOT as ordered. Yes Electronic Signature(s) Signed: 03/30/2023 4:36:31 PM By: [name] Corwin DO Previous Signature: 03/30/2023 12:01:51 PM Version By: [name] CHT EMT BS , , Previous Signature: 03/30/2023 1:38:10 PM Version By: [name] Corwin DO Previous Signature: 03/30/2023 11:25:25 AM Version By: [name] CHT EMT BS , , Entered By: [name] on 03/30/2023 13:18:42 [name], [name] Garter ([id number]) [id number] Page 2 of 2 -------------------------------------------------------------------------------- HBO Safety Checklist Details Patient Name: Date of Service: [name], [name] 03/30/2023 7:30 A M Medical Record Number: [id number] Patient Account Number: [id number] Date of Birth/Sex: Treating RN: [birth year] (57 y.o. [name] Primary Care [name]: [name] Other Clinician: Haywood [name] Referring [name]: Treating [name]/Extender: [name] in Treatment: 13 HBO Safety Checklist Items Safety Checklist Consent Form Signed Patient voided / foley secured and emptied 0700 - Sausage Egg Cheese Biscuit, protein When  did you last eato shake Last dose of injectable or oral agent 0645 Ostomy pouch emptied and vented if applicable NA All implantable devices assessed, documented and approved Freestyle Libre 2 and3 on approved list Intravenous access site secured and place NA Valuables  secured Linens and cotton and cotton/polyester blend (less than 51% polyester) Personal oil-based products / skin lotions / body lotions removed Wigs or hairpieces removed NA Smoking or tobacco materials removed NA Books / newspapers / magazines / loose paper removed Cologne, aftershave, perfume and deodorant removed Jewelry removed (may wrap wedding band) Make-up removed NA Hair care products removed Libre Freestyle 2 and 3 Sensor Approved, other Battery operated devices (external) removed electronics removed. Heating patches and chemical warmers removed Titanium eyewear removed Nail polish cured greater than 10 hours NA Casting material cured greater than 10 hours NA Hearing aids removed NA Loose dentures or partials removed NA Prosthetics have been removed NA Patient demonstrates correct use of air break device (if applicable) Patient concerns have been addressed Patient grounding bracelet on and cord attached to chamber Specifics for Inpatients (complete in addition to above) Medication sheet sent with patient NA Intravenous medications needed or due during therapy sent with patient NA Drainage tubes (e.g. nasogastric tube or chest tube secured and vented) NA Endotracheal or Tracheotomy tube secured NA Cuff deflated of air and inflated with saline NA Airway suctioned NA Notes Paper version used prior to treatment start. Electronic Signature(s) Signed: 03/30/2023 8:51:37 AM By: [name] CHT EMT BS , , Entered By: [name] on 03/30/2023 05:51:37

## 2023-03-31 ENCOUNTER — Ambulatory Visit: Payer: 59 | Admitting: Vascular Surgery

## 2023-03-31 ENCOUNTER — Ambulatory Visit: Payer: 59 | Admitting: Family

## 2023-03-31 ENCOUNTER — Encounter: Payer: Self-pay | Admitting: Vascular Surgery

## 2023-03-31 ENCOUNTER — Telehealth: Payer: Self-pay

## 2023-03-31 VITALS — BP 150/68 | HR 72 | Temp 98.2°F | Resp 18 | Ht 69.0 in | Wt 202.9 lb

## 2023-03-31 DIAGNOSIS — L97423 Non-pressure chronic ulcer of left heel and midfoot with necrosis of muscle: Secondary | ICD-10-CM

## 2023-03-31 DIAGNOSIS — I739 Peripheral vascular disease, unspecified: Secondary | ICD-10-CM | POA: Diagnosis not present

## 2023-03-31 NOTE — Telephone Encounter (Signed)
Peer to Peer needs to be completed for patient.  CB# [phone].  Reference# [id number]  Please advise.  Thank you.

## 2023-03-31 NOTE — Progress Notes (Signed)
Patient name: [name] MRN: [id number] DOB: Feb 15, 1966 Sex: male  REASON FOR CONSULT: wound check, 6 week interval   HPI: [name] is a 57 y.o. male, with hx CAD, CKD, COPD, DM, HLD, tobacco abuse that presents for wound check of left achilles wound.  This wound started about early May.  He did have ABIs on 12/31/2022 that were 1.16 on the right triphasic and noncompressible on the left triphasic.  He did have a marginal toe pressure of 58 on the left.  We recently performed a left lower extremity angiogram on 01/15/2023 showing two-vessel runoff in the anterior tibial and peroneal with inline flow.  Doing hyperbarics.  He had debridement of his Achilles wound with grafting with Kerecis by Dr. [name] - worried about the size of the wound.  Past Medical History:  Diagnosis Date   Anginal pain (HCC)    Anxiety    Arthritis    RA IN HANDS   Asthma    CAD (coronary artery disease) 04/27/2014   Chronic renal disease, stage 3, moderately decreased glomerular filtration rate between 30-59 mL/min/1.73 square meter (HCC) 05/03/2014   COPD (chronic obstructive pulmonary disease) (HCC)    Coronary artery disease    Diabetes (HCC) 05/03/2014   Diabetes mellitus without complication (HCC)    type 1   Hyperlipidemia    Left shoulder pain    Peripheral vascular disease (HCC)    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/03/2014   Shortness of breath    Sleep apnea    mild OSA, no CPAP   Unstable angina pectoris (HCC) 04/25/2014    Past Surgical History:  Procedure Laterality Date   ABDOMINAL AORTOGRAM W/LOWER EXTREMITY N/A 01/15/2023   Procedure: ABDOMINAL AORTOGRAM W/LOWER EXTREMITY;  Surgeon: Cephus Shelling, MD;  Location: [facility]ASIVE CV LAB;  Service: Cardiovascular;  Laterality: N/A;   CARDIAC CATHETERIZATION  04/25/2014   BY DR Jacinto Halim   CARDIAC CATHETERIZATION N/A 10/30/2015   Procedure: Left Heart Cath and Cors/Grafts Angiography;  Surgeon: Yates Decamp, MD;   Location: [facility] INVASIVE CV LAB;  Service: Cardiovascular;  Laterality: N/A;   CARPAL TUNNEL RELEASE     CATARACT EXTRACTION, BILATERAL     CORONARY ARTERY BYPASS GRAFT N/A 04/27/2014   Procedure: CORONARY ARTERY BYPASS GRAFTING on pump using left internal mammary artery to LAD coronary artery, right great saphenous vein graft to diagonal coronary artery with sequential to OM1 and circumflex coronary arteries. Right greater saphenous vein graft to posterior descending coronary artery. ;  Surgeon: Delight Ovens, MD;  Location: [facility] OR;  Service: Open Heart Surgery;  Laterality: N   elbow drained Left 05/09/2019   ENDOVEIN HARVEST OF GREATER SAPHENOUS VEIN Right 04/27/2014   Procedure: ENDOVEIN HARVEST OF GREATER SAPHENOUS VEIN;  Surgeon: Delight Ovens, MD;  Location: [facility] OR;  Service: Open Heart Surgery;  Laterality: Right;   EXCISION ORAL TUMOR N/A 03/01/2018   Procedure: EXCISION ORAL TUMOR;  Surgeon: Christia Reading, MD;  Location: [facility];  Service: ENT;  Laterality: N/A;   EYE SURGERY     LASER   EYE SURGERY Bilateral    astigmatism correction    HARDWARE REMOVAL Left 10/03/2020   Procedure: LEFT FOOT REMOVAL HARDWARE, PLACE VANC POWDER;  Surgeon: Nadara Mustard, MD;  Location: [facility] OR;  Service: Orthopedics;  Laterality: Left;   I & D EXTREMITY Left 03/04/2023   Procedure: LEFT ACHILLES DEBRIDEMENT;  Surgeon: Nadara Mustard, MD;  Location: [facility] OR;  Service: Orthopedics;  Laterality: Left;   INTRAOPERATIVE TRANSESOPHAGEAL ECHOCARDIOGRAM N/A 04/27/2014   Procedure: INTRAOPERATIVE TRANSESOPHAGEAL ECHOCARDIOGRAM;  Surgeon: Delight Ovens, MD;  Location[facility];  Service: Open Heart Surgery;  Laterality: N/A;   IR RADIOLOGIST EVAL & MGMT  12/22/2022   LEFT HEART CATHETERIZATION WITH CORONARY ANGIOGRAM N/A 04/25/2014   Procedure: LEFT HEART CATHETERIZATION WITH CORONARY ANGIOGRAM;  Surgeon: Pamella Pert, MD;  Location: [facility] CATH LAB;  Service: Cardiovascular;  Laterality: N/A;    MOUTH SURGERY  11/15/2017   tongue surgery    ORIF TOE FRACTURE Left 03/22/2020   Procedure: OPEN REDUCTION INTERNAL FIXATION (ORIF) Non Union 5th Metatarsal;  Surgeon: Kathryne Hitch, MD;  Location: [facility];  Service: Orthopedics;  Laterality: Left;    Family History  Problem Relation Age of Onset   Stomach cancer Mother    Cancer Mother    Prostate cancer Father    Heart disease Father    Cancer - Prostate Father    Heart disease Brother    Asthma Daughter    Healthy Daughter    Asthma Daughter     SOCIAL HISTORY: Social History   Socioeconomic History   Marital status: Divorced    Spouse name: Not on file   Number of children: 3   Years of education: Not on file   Highest education level: Not on file  Occupational History   Not on file  Tobacco Use   Smoking status: Former    Current packs/day: 0.25    Average packs/day: 0.3 packs/day for 34.0 years (8.5 ttl pk-yrs)    Types: Cigarettes   Smokeless tobacco: Never  Vaping Use   Vaping status: Never Used  Substance and Sexual Activity   Alcohol use: Not Currently    Comment: RARE   Drug use: No   Sexual activity: Not on file  Other Topics Concern   Not on file  Social History Narrative   Not on file   Social Determinants of Health   Financial Resource Strain: Not on file  Food Insecurity: Not on file  Transportation Needs: Not on file  Physical Activity: Not on file  Stress: Not on file  Social Connections: Not on file  Intimate Partner Violence: Not on file    Allergies  Allergen Reactions   Tramadol Shortness Of Breath   Metoprolol Diarrhea   Niacin And Related     night sweats    Current Outpatient Medications  Medication Sig Dispense Refill   albuterol (VENTOLIN HFA) 108 (90 Base) MCG/ACT inhaler Inhale 2 puffs into the lungs every 4 (four) hours as needed.     Ascorbic Acid (VITAMIN C PO) Take 2,000 mg by mouth daily.     aspirin EC 81 MG tablet Take 1 tablet (81  mg total) by mouth daily. Swallow whole. 150 tablet 2   bisoprolol (ZEBETA) 5 MG tablet TAKE ONE-HALF TABLET BY MOUTH  DAILY 45 tablet 3   Blood Glucose Monitoring Suppl (ONETOUCH VERIO FLEX SYSTEM) w/Device KIT USE TO CHECK BLOOD GLUCOSE UP TO 10 TIMES PER DAY     buPROPion (WELLBUTRIN SR) 150 MG 12 hr tablet TAKE 1 TABLET BY MOUTH DAILY 90 tablet 3   calcium carbonate (OSCAL) 1500 (600 Ca) MG TABS tablet Take 600 mg of elemental calcium by mouth daily.     cholecalciferol (VITAMIN D3) 25 MCG (1000 UNIT) tablet Take 1,000 Units by mouth daily.     COD LIVER OIL PO Take 1 capsule by mouth  daily.     Continuous Blood Gluc Sensor (FREESTYLE LIBRE 14 DAY SENSOR) MISC CHANGE EVERY 14 DAYS TO MONITOR BLOOD GLUCOSE     gabapentin (NEURONTIN) 300 MG capsule Take 1 capsule (300 mg total) by mouth at bedtime. 30 capsule 2   HUMALOG 100 UNIT/ML injection Inject 15-20 Units into the skin 3 (three) times daily with meals. Sliding scale  Depending on carb intake     HYDROcodone-acetaminophen (NORCO/VICODIN) 5-325 MG tablet Take 1 tablet by mouth every 4 (four) hours as needed. 30 tablet 0   insulin glargine (LANTUS) 100 UNIT/ML injection Inject 17 Units into the skin 2 (two) times daily.     levofloxacin (LEVAQUIN) 750 MG tablet Take 750 mg by mouth daily.     losartan (COZAAR) 25 MG tablet Take 25 mg by mouth daily.     Magnesium 500 MG CAPS Take 500 mg by mouth 2 (two) times daily.     nitroGLYCERIN (NITROSTAT) 0.4 MG SL tablet DISSOLVE 1 TABLET UNDER THE TONGUE EVERY 5 MINUTES AS  NEEDED FOR CHEST PAIN. MAX  OF 3 TABLETS IN 15 MINUTES. CALL 911 IF PAIN PERSISTS. (Patient taking differently: Place 0.4 mg under the tongue every 5 (five) minutes as needed for chest pain.) 100 tablet 3   Omega-3 Fatty Acids (FISH OIL PO) Take 5,000 mg by mouth daily.     OVER THE COUNTER MEDICATION Take 1 capsule by mouth 2 (two) times daily. Nitric oxide blood flow supplement     pentoxifylline (TRENTAL) 400 MG CR tablet TAKE  1 TABLET BY MOUTH THREE TIMES DAILY WITH MEALS 90 tablet 0   Probiotic Product (ALIGN) 4 MG CAPS Take 4 mg by mouth daily.     ranolazine (RANEXA) 500 MG 12 hr tablet Take 250 mg by mouth at bedtime.     SANTYL 250 UNIT/GM ointment Apply 1 Application topically 2 (two) times daily.     simvastatin (ZOCOR) 20 MG tablet TAKE 1 TABLET BY MOUTH AT BEDTIME 90 tablet 0   sulfamethoxazole-trimethoprim (BACTRIM DS) 800-160 MG tablet Take 1 tablet by mouth 2 (two) times daily.     TURMERIC CURCUMIN PO Take 1,500 mg by mouth 2 (two) times daily.     varenicline (CHANTIX) 0.5 MG tablet Take 0.5 mg by mouth daily.     vitamin B-12 (CYANOCOBALAMIN) 500 MCG tablet Take 500 mcg by mouth daily.     vitamin E 180 MG (400 UNITS) capsule Take 400 Units by mouth daily.     rivaroxaban (XARELTO) 2.5 MG TABS tablet Take 1 tablet (2.5 mg total) by mouth 2 (two) times daily. (Patient not taking: Reported on 03/31/2023) 180 tablet 1   No current facility-administered medications for this visit.    REVIEW OF SYSTEMS:  [X]  denotes positive finding, [ ]  denotes negative finding Cardiac  Comments:  Chest pain or chest pressure:    Shortness of breath upon exertion:    Short of breath when lying flat:    Irregular heart rhythm:        Vascular    Pain in calf, thigh, or hip brought on by ambulation:    Pain in feet at night that wakes you up from your sleep:     Blood clot in your veins:    Leg swelling:         Pulmonary    Oxygen at home:    Productive cough:     Wheezing:         Neurologic  Sudden weakness in arms or legs:     Sudden numbness in arms or legs:     Sudden onset of difficulty speaking or slurred speech:    Temporary loss of vision in one eye:     Problems with dizziness:         Gastrointestinal    Blood in stool:     Vomited blood:         Genitourinary    Burning when urinating:     Blood in urine:        Psychiatric    Major depression:         Hematologic    Bleeding  problems:    Problems with blood clotting too easily:        Skin    Rashes or ulcers:        Constitutional    Fever or chills:      PHYSICAL EXAM: Vitals:   03/31/23 1403  BP: (!) 150/68  Pulse: 72  Resp: 18  Temp: 98.2 F (36.8 C)  TempSrc: Temporal  SpO2: 96%  Weight: 202 lb 14.4 oz (92 kg)  Height: 5\' 9"  (1.753 m)    GENERAL: The patient is a well-nourished male, in no acute distress. The vital signs are documented above. CARDIAC: There is a regular rate and rhythm.  VASCULAR:  Palpable femoral pulses bilaterally No palpable pedal pulses, triphasic dorsalis pedis signal in left foot Left achilles wound pictured-from Dr. Audrie Lia office today MUSCULOSKELETAL: There are no major deformities or cyanosis. NEUROLOGIC: No focal weakness or paresthesias are detected. PSYCHIATRIC: The patient has a normal affect.    DATA:   ABIs on 12/31/2022 that were 1.16 on the right triphasic and noncompressible on the left triphasic.    Assessment/Plan:  57 y.o. male, with hx CAD, CKD, COPD, DM, HLD, tobacco abuse that presents for follow-up and wound check of left achilles wound. This wound started about early May.  We did an angiogram on 01/15/2023 showing two-vessel runoff in the anterior tibial and peroneal and no intervention was performed and he had in-line flow.  I think he has enough inflow to heal this wound.  These can be very difficult to heal given lack of blood supply over the Achilles on the back of the heel and appreciate Dr. [name] input.  He still has a brisk triphasic dorsalis pedis signal in the left foot.  I will plan to see him again in 6 weeks for wound check.      Cephus Shelling, MD [facility] Office: [phone]

## 2023-03-31 NOTE — Telephone Encounter (Signed)
I have called several times to number below. I cannot get connected to correct department. When I last tried recently I tried to get connected to [employer] again to try to reach customer service rep, it booted me off stating "sorry call cannot be connected"

## 2023-04-01 ENCOUNTER — Encounter (HOSPITAL_BASED_OUTPATIENT_CLINIC_OR_DEPARTMENT_OTHER): Payer: 59 | Admitting: Internal Medicine

## 2023-04-01 DIAGNOSIS — S91302A Unspecified open wound, left foot, initial encounter: Secondary | ICD-10-CM | POA: Diagnosis not present

## 2023-04-01 LAB — GLUCOSE, CAPILLARY: Glucose-Capillary: 210 mg/dL — ABNORMAL HIGH (ref 70–99)

## 2023-04-01 NOTE — Progress Notes (Addendum)
MCCLAIN, OTTINGER ([id number]) [id number] Page 1 of 2 Visit Report for 04/01/2023 HBO Details Patient Name: Date of Service: [name], [name] 04/01/2023 7:30 A M Medical Record Number: [id number] Patient Account Number: [id number] Date of Birth/Sex: Treating RN: 23-May-1966 (57 y.o. [name] Primary Care Mickey Esguerra: [name] Other Clinician: [name] Referring Diezel Mazur: Treating Psalm Schappell/Extender: Theodis Sato in Treatment: 13 HBO Treatment Course Details Treatment Course Number: 1 Ordering Robin Petrakis: Geralyn Corwin T Treatments Ordered: otal 30 HBO Treatment Start Date: 02/16/2023 HBO Indication: Other (specify in Notes) Notes: Acute peripheral arterial insufficiency HBO Treatment Details Treatment Number: 27 Patient Type: Outpatient Chamber Type: Monoplace Chamber Serial #: [id number] Treatment Protocol: 2.0 ATA with 90 minutes oxygen, with two 5 minute air breaks Treatment Details Compression Rate Down: 1.5 psi / minute De-Compression Rate Up: 2.0 psi / minute A breaks and breathing ir Compress Tx Pressure periods Decompress Decompress Begins Reached (leave unused spaces Begins Ends blank) Chamber Pressure (ATA 1 2 2 2 2 2  --2 1 ) Clock Time (24 hr) 08:26 08:36 09:06 09:11 09:41 09:46 - - 10:16 10:24 Treatment Length: 118 (minutes) Treatment Segments: 4 Vital Signs Capillary Blood Glucose Reference Range: 80 - 120 mg / dl HBO Diabetic Blood Glucose Intervention Range: <131 mg/dl or >063 mg/dl Type: Time Vitals Blood Pulse: Respiratory Temperature: Capillary Blood Glucose Pulse Action Taken: Pressure: Rate: Glucose (mg/dl): Meter #: Oximetry (%) Taken: Pre 07:51 112/64 70 18 98.2 124 patient stated he just ate, so wait short time. Pre 08:04 111 Given 8 oz Glucerna, 1 orange juice. Pre 08:17 122 informed physician, proceed with HBOT Post 10:29 120/65 67 18 97.3 210 none per protocol Treatment  Response Treatment Toleration: Well Treatment Completion Status: Treatment Completed without Adverse Event Treatment Notes Mr. Pelican arrived with normal vital signs but blood glucose level below protocol requirements. Patient stated that he had just recently eaten and waiting a short time would prove effective. Patient prepared for treatment. 0804 blood glucose level was 111 mg/dL. Mr. Bernardy then opted for 8 oz Glucerna and a 4 oz orange juice. 0817 blood glucose level was 122 mg/dL. Per protocol, patient was cleared for HBOT Informed Dr. Leanord Hawking of patient's blood glucose and . interventions. Patient typically has good control of blood glucose levels. Patient cleared for hyperbaric treatments by physician. 4 oz orange juice sent with patient for safety/intervention. After performing a safety check, patient was placed in the chamber which was compressed with 100% oxygen at a rate of 2 psi/min after confirming normal ear equalization. He tolerated the treatment and subsequent decompression at a rate of 2 psi/min. Post treatment vital signs were within normal range with a blood glucose level of 210 mg/dL. Patient self-administered insulin prior to departure. He was stable upon discharge with his significant other accompanying him. Electronic Signature(s) Signed: 04/01/2023 12:21:06 PM By: [name] CHT EMT BS , , Signed: 04/01/2023 5:08:25 PM By: [name] MD Previous Signature: 04/01/2023 12:14:06 PM Version By: [name] CHT EMT BS , , Entered By: [name] on 04/01/2023 12:21:05 LERAY, GRUMBINE ([id number]) [id number] Page 2 of 2 -------------------------------------------------------------------------------- HBO Safety Checklist Details Patient Name: Date of Service: HEYD, [name] 04/01/2023 7:30 A M Medical Record Number: [id number] Patient Account Number: [id number] Date of Birth/Sex: Treating RN: 08-15-1965 (57 y.o. [name] Primary Care Lisandra Mathisen: [name] Other Clinician: [name] Referring Jerzie Bieri: Treating Johnn Krasowski/Extender: Theodis Sato in Treatment: 13 HBO Safety Checklist Items Safety Checklist  Consent Form Signed Patient voided / foley secured and emptied When did you last eato 0700 Last dose of injectable or oral agent 0645 - 17 Unit Lantus Ostomy pouch emptied and vented if applicable NA All implantable devices assessed, documented and approved NA Intravenous access site secured and place NA Valuables secured Linens and cotton and cotton/polyester blend (less than 51% polyester) Personal oil-based products / skin lotions / body lotions removed Wigs or hairpieces removed NA Smoking or tobacco materials removed NA Books / newspapers / magazines / loose paper removed Cologne, aftershave, perfume and deodorant removed Jewelry removed (may wrap wedding band) Make-up removed Hair care products removed Battery operated devices (external) removed Heating patches and chemical warmers removed Titanium eyewear removed Nail polish cured greater than 10 hours NA Casting material cured greater than 10 hours NA Hearing aids removed NA Loose dentures or partials removed NA Prosthetics have been removed NA Patient demonstrates correct use of air break device (if applicable) Patient concerns have been addressed Patient grounding bracelet on and cord attached to chamber Specifics for Inpatients (complete in addition to above) Medication sheet sent with patient NA Intravenous medications needed or due during therapy sent with patient NA Drainage tubes (e.g. nasogastric tube or chest tube secured and vented) NA Endotracheal or Tracheotomy tube secured NA Cuff deflated of air and inflated with saline NA Airway suctioned NA Notes Paper version used prior to treatment start. Electronic Signature(s) Signed: 04/01/2023 12:09:37 PM By:  [name] CHT EMT BS , , Entered By: [name] on 04/01/2023 12:09:36

## 2023-04-01 NOTE — Progress Notes (Signed)
ASAH, RAMSIER ([id number]) [id number] Page 1 of 2 Visit Report for 04/01/2023 Arrival Information Details Patient Name: Date of Service: [place], [name] 04/01/2023 7:30 A M Medical Record Number: [id number] Patient Account Number: [id number] Date of Birth/Sex: Treating RN: [birth year] (57 y.o. [name] Primary Care Saidee Geremia: [name] Other Clinician: Karl Bales Referring Rani Idler: Treating Tonie Elsey/Extender: Theodis Sato in Treatment: 13 Visit Information History Since Last Visit All ordered tests and consults were completed: Yes Patient Arrived: Ambulatory Added or deleted any medications: No Arrival Time: 07:41 Any new allergies or adverse reactions: No Accompanied By: significant other Had a fall or experienced change in No Transfer Assistance: None activities of daily living that may affect Patient Identification Verified: Yes risk of falls: Secondary Verification Process Completed: Yes Signs or symptoms of abuse/neglect since last visito No Patient Requires Transmission-Based Precautions: No Hospitalized since last visit: No Patient Has Alerts: No Implantable device outside of the clinic excluding No cellular tissue based products placed in the center since last visit: Pain Present Now: No Electronic Signature(s) Signed: 04/01/2023 12:06:50 PM By: [name] CHT EMT BS , , Previous Signature: 04/01/2023 12:06:02 PM Version By: [name] CHT EMT BS , , Entered By: [name] on 04/01/2023 09:06:50 -------------------------------------------------------------------------------- Encounter Discharge Information Details Patient Name: Date of Service: [name], [name] 04/01/2023 7:30 A M Medical Record Number: [id number] Patient Account Number: [id number] Date of Birth/Sex: Treating RN: 08-16-1965 (57 y.o. [name] Primary Care Matin Mattioli: [name] Other Clinician: [name] Referring Marti Mclane: Treating Aamya Orellana/Extender: Theodis Sato in Treatment: 13 Encounter Discharge Information Items Discharge Condition: Stable Ambulatory Status: Ambulatory Discharge Destination: Home Transportation: Private Auto Accompanied By: significant other Schedule Follow-up Appointment: No Clinical Summary of Care: Electronic Signature(s) Signed: 04/01/2023 12:21:52 PM By: [name] CHT EMT BS , , Entered By: [name] on 04/01/2023 09:21:52 [name] ([id number]) [id number] Page 2 of 2 -------------------------------------------------------------------------------- Vitals Details Patient Name: Date of Service: [place], [name] 04/01/2023 7:30 A M Medical Record Number: [id number] Patient Account Number: [id number] Date of Birth/Sex: Treating RN: 12-12-1965 (57 y.o. [name] Primary Care Chauntae Hults: [name] Other Clinician: Karl Bales Referring Tyquarius Paglia: Treating Jennell Janosik/Extender: Theodis Sato in Treatment: 13 Vital Signs Time Taken: 07:51 Temperature (F): 98.2 Height (in): 68 Pulse (bpm): 70 Weight (lbs): 200 Respiratory Rate (breaths/min): 18 Body Mass Index (BMI): 30.4 Blood Pressure (mmHg): 112/64 Capillary Blood Glucose (mg/dl): 366 Reference Range: 80 - 120 mg / dl Electronic Signature(s) Signed: 04/01/2023 12:06:39 PM By: [name] CHT EMT BS , , Entered By: [name] on 04/01/2023 09:06:39

## 2023-04-01 NOTE — Progress Notes (Signed)
MALIKHI, COATES ([id number]) [id number] Page 1 of 1 Visit Report for 04/01/2023 SuperBill Details Patient Name: Date of Service: [name], [name] 04/01/2023 Medical Record Number: [id number] Patient Account Number: [id number] Date of Birth/Sex: Treating RN: January 27, 1966 (57 y.o. 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 J44.9 Chronic obstructive pulmonary disease, unspecified M06.9 Rheumatoid arthritis, unspecified Z87.891 Personal history of nicotine dependence Facility Procedures CPT4 Code Description Modifier Quantity [id number] G0277-(Facility Use Only) HBOT full body chamber, , 4 ICD-10 Diagnosis Description 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 S91.302A Unspecified open wound, left foot, initial encounter 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 Physician Procedures Quantity CPT4 Code Description Modifier [id number] - WC PHYS HYPERBARIC OXYGEN THERAPY 1 ICD-10 Diagnosis Description 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

## 2023-04-02 ENCOUNTER — Encounter (HOSPITAL_BASED_OUTPATIENT_CLINIC_OR_DEPARTMENT_OTHER): Payer: 59 | Admitting: Internal Medicine

## 2023-04-02 DIAGNOSIS — L97528 Non-pressure chronic ulcer of other part of left foot with other specified severity: Secondary | ICD-10-CM

## 2023-04-02 DIAGNOSIS — I70245 Atherosclerosis of native arteries of left leg with ulceration of other part of foot: Secondary | ICD-10-CM | POA: Diagnosis not present

## 2023-04-02 DIAGNOSIS — J449 Chronic obstructive pulmonary disease, unspecified: Secondary | ICD-10-CM

## 2023-04-02 DIAGNOSIS — S91302A Unspecified open wound, left foot, initial encounter: Secondary | ICD-10-CM | POA: Diagnosis not present

## 2023-04-02 DIAGNOSIS — E10621 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: Secondary | ICD-10-CM

## 2023-04-02 NOTE — Telephone Encounter (Signed)
I have called twice again to the number below, to try and schedule a peer to peer. I cannot get connected to a rep. It only gives me automated options for peer to peer for diagnostic imaging and/or cardiac imaging and tests.

## 2023-04-03 ENCOUNTER — Encounter (HOSPITAL_BASED_OUTPATIENT_CLINIC_OR_DEPARTMENT_OTHER): Payer: 59 | Admitting: Internal Medicine

## 2023-04-03 DIAGNOSIS — L97528 Non-pressure chronic ulcer of other part of left foot with other specified severity: Secondary | ICD-10-CM

## 2023-04-03 DIAGNOSIS — E10621 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: Secondary | ICD-10-CM | POA: Diagnosis not present

## 2023-04-03 DIAGNOSIS — I70245 Atherosclerosis of native arteries of left leg with ulceration of other part of foot: Secondary | ICD-10-CM

## 2023-04-03 DIAGNOSIS — S91302A Unspecified open wound, left foot, initial encounter: Secondary | ICD-10-CM

## 2023-04-03 LAB — GLUCOSE, CAPILLARY
Glucose-Capillary: 135 mg/dL — ABNORMAL HIGH (ref 70–99)
Glucose-Capillary: 218 mg/dL — ABNORMAL HIGH (ref 70–99)

## 2023-04-03 NOTE — Progress Notes (Signed)
INDIANA, SACK ([id number]) [id number] Page 1 of 2 Visit Report for 04/03/2023 HBO Details Patient Name: Date of Service: [name], [name] 04/03/2023 7:30 A M Medical Record Number: [id number] Patient Account Number: [id number] Date of Birth/Sex: Treating RN: 1966/06/10 (57 y.o. [name] Primary Care Chanin Frumkin: [name] Other Clinician: [name] Referring Kaitlin Ardito: Treating Amiah Frohlich/Extender: Donnetta Hail in Treatment: 14 HBO Treatment Course Details Treatment Course Number: 1 Ordering Anaily Ashbaugh: Geralyn Corwin T Treatments Ordered: otal 30 HBO Treatment Start Date: 02/16/2023 HBO Indication: Other (specify in Notes) Notes: Acute peripheral arterial insufficiency HBO Treatment Details Treatment Number: 28 Patient Type: Outpatient Chamber Type: Monoplace Chamber Serial #: [id number] Treatment Protocol: 2.0 ATA with 90 minutes oxygen, with two 5 minute air breaks Treatment Details Compression Rate Down: 1.5 psi / minute De-Compression Rate Up: 2.0 psi / minute A breaks and breathing ir Compress Tx Pressure periods Decompress Decompress Begins Reached (leave unused spaces Begins Ends blank) Chamber Pressure (ATA 1 2 2 2 2 2  --2 1 ) Clock Time (24 hr) 08:21 08:33 09:03 09:08 09:38 09:43 - - 10:16 10:23 Treatment Length: 122 (minutes) Treatment Segments: 4 Vital Signs Capillary Blood Glucose Reference Range: 80 - 120 mg / dl HBO Diabetic Blood Glucose Intervention Range: <131 mg/dl or >086 mg/dl Type: Time Vitals Blood Respiratory Capillary Blood Glucose Pulse Action Pulse: Temperature: Taken: Pressure: Rate: Glucose (mg/dl): Meter #: Oximetry (%) Taken: Pre 07:44 131/57 72 18 97.7 135 none per protocol Post 10:36 136/66 66 18 97.2 218 none per protocol Treatment Response Treatment Toleration: Well Treatment Completion Status: Treatment Completed without Adverse Event Treatment Notes Patient  arrived with normal vital signs. He stated he ate breakfast and detailed what he ate. Blood glucose level was close to the lower threshold per protocol, however, based on experience with patient and his well control of blood glucose, he was cleared for treatment. T oday we set up an in-chamber TCOM. Patient was prepped. All items used to prepare patient that are not allowed in the chamber were cleared with safety check. Upon completion of the safety check, patient was placed in the chamber which was compressed at 2 psi/min after confirming normal ear equalization. He tolerated the treatment and subsequent decompression of the chamber at a rate of 2 psi/min. Post-treatment vital signs were within normal range. He was stable upon discharge with his significant other accompanying him. Physician HBO Attestation: I certify that I supervised this HBO treatment in accordance with Medicare guidelines. A trained emergency response team is readily available per Yes hospital policies and procedures. Continue HBOT as ordered. Yes Electronic Signature(s) Signed: 04/07/2023 3:11:03 PM By: Geralyn Corwin DO Previous Signature: 04/03/2023 4:40:00 PM Version By: [name] CHT EMT BS , , Previous Signature: 04/03/2023 12:28:29 PM Version By: [name] CHT EMT BS , , Previous Signature: 04/03/2023 12:27:52 PM Version By: [name] CHT EMT BS , , Entered By: Geralyn Corwin on 04/07/2023 11:06:14 [name] ([id number]) [id number] Page 2 of 2 -------------------------------------------------------------------------------- HBO Safety Checklist Details Patient Name: Date of Service: DERBYSHIRE, [name] 04/03/2023 7:30 A M Medical Record Number: [id number] Patient Account Number: [id number] Date of Birth/Sex: Treating RN: April 10, 1966 (57 y.o. [name] Primary Care Shine Mikes: [name] Other Clinician: [name] Referring  Afomia Blackley: Treating Sedonia Kitner/Extender: Donnetta Hail in Treatment: 14 HBO Safety Checklist Items Safety Checklist Consent Form Signed Patient voided / foley secured and emptied 0700 - Sausage Egg Biscuit with Orang Juice, When did  you last eato protein shake Last dose of injectable or oral agent 0630 17 Units Lantus Ostomy pouch emptied and vented if applicable NA All implantable devices assessed, documented and approved Freestyle Libre 2 and3 on approved list Intravenous access site secured and place NA Valuables secured Linens and cotton and cotton/polyester blend (less than 51% polyester) Personal oil-based products / skin lotions / body lotions removed Wigs or hairpieces removed NA Smoking or tobacco materials removed NA Books / newspapers / magazines / loose paper removed Cologne, aftershave, perfume and deodorant removed Jewelry removed (may wrap wedding band) Make-up removed NA Hair care products removed Battery operated devices (external) removed Heating patches and chemical warmers removed Titanium eyewear removed Nail polish cured greater than 10 hours NA Casting material cured greater than 10 hours NA Hearing aids removed NA Loose dentures or partials removed NA Prosthetics have been removed NA Patient demonstrates correct use of air break device (if applicable) Patient concerns have been addressed Patient grounding bracelet on and cord attached to chamber Specifics for Inpatients (complete in addition to above) Medication sheet sent with patient NA Intravenous medications needed or due during therapy sent with patient NA Drainage tubes (e.g. nasogastric tube or chest tube secured and vented) NA Endotracheal or Tracheotomy tube secured NA Cuff deflated of air and inflated with saline NA Airway suctioned NA Notes Paper version used prior to treatment start. Electronic Signature(s) Signed: 04/03/2023 12:25:36 PM By:  [name] CHT EMT BS , , Entered By: [name] on 04/03/2023 09:25:36

## 2023-04-03 NOTE — Progress Notes (Signed)
VENNIE, GIAMANCO ([id number]) [id number] Page 1 of 8 Visit Report for 04/02/2023 Arrival Information Details Patient Name: Date of Service: [name], [name] 04/02/2023 10:15 A M Medical Record Number: [id number] Patient Account Number: [id number] Date of Birth/Sex: Treating RN: 1965-10-24 (57 y.o. M) Primary Care Shamaria Kavan: [name] Other Clinician: Referring Nolen Lindamood: Treating Markeise Mathews/Extender: Donnetta Hail in Treatment: 14 Visit Information History Since Last Visit Added or deleted any medications: No Patient Arrived: Ambulatory Any new allergies or adverse reactions: No Arrival Time: 10:13 Had a fall or experienced change in No Accompanied By: self activities of daily living that may affect Transfer Assistance: None risk of falls: Patient Identification Verified: Yes Signs or symptoms of abuse/neglect since last visito No Secondary Verification Process Completed: Yes Hospitalized since last visit: No Patient Requires Transmission-Based Precautions: No Implantable device outside of the clinic excluding No Patient Has Alerts: No cellular tissue based products placed in the center since last visit: Has Dressing in Place as Prescribed: Yes Pain Present Now: No Electronic Signature(s) Signed: 04/03/2023 12:25:24 PM By: Thayer Dallas Entered By: Thayer Dallas on 04/02/2023 07:19:06 -------------------------------------------------------------------------------- Clinic Level of Care Assessment Details Patient Name: Date of Service: [place], [name] 04/02/2023 10:15 A M Medical Record Number: [id number] Patient Account Number: [id number] Date of Birth/Sex: Treating RN: 02-14-1966 (57 y.o. [name] Primary Care Telecia Larocque: [name] Other Clinician: Referring Jesua Tamblyn: Treating Katreena Schupp/Extender: Donnetta Hail in Treatment: 14 Clinic Level of Care Assessment Items TOOL 4  Quantity Score X- 1 0 Use when only an EandM is performed on FOLLOW-UP visit ASSESSMENTS - Nursing Assessment / Reassessment X- 1 10 Reassessment of Co-morbidities (includes updates in patient status) X- 1 5 Reassessment of Adherence to Treatment Plan ASSESSMENTS - Wound and Skin A ssessment / Reassessment []  - 0 Simple Wound Assessment / Reassessment - one wound []  - 0 Complex Wound Assessment / Reassessment - multiple wounds []  - 0 Dermatologic / Skin Assessment (not related to wound area) ASSESSMENTS - Focused Assessment []  - 0 Circumferential Edema Measurements - multi extremities X- 1 10 Nutritional Assessment / Counseling / Intervention [name], [name] ([id number]) [id number] Page 2 of 8 []  - 0 Lower Extremity Assessment (monofilament, tuning fork, pulses) []  - 0 Peripheral Arterial Disease Assessment (using hand held doppler) ASSESSMENTS - Ostomy and/or Continence Assessment and Care []  - 0 Incontinence Assessment and Management []  - 0 Ostomy Care Assessment and Management (repouching, etc.) PROCESS - Coordination of Care X - Simple Patient / Family Education for ongoing care 1 15 []  - 0 Complex (extensive) Patient / Family Education for ongoing care X- 1 10 Staff obtains Chiropractor, Records, T Results / Process Orders est []  - 0 Staff telephones HHA, Nursing Homes / Clarify orders / etc []  - 0 Routine Transfer to another Facility (non-emergent condition) []  - 0 Routine Hospital Admission (non-emergent condition) []  - 0 New Admissions / Manufacturing engineer / Ordering NPWT Apligraf, etc. , []  - 0 Emergency Hospital Admission (emergent condition) X- 1 10 Simple Discharge Coordination []  - 0 Complex (extensive) Discharge Coordination PROCESS - Special Needs []  - 0 Pediatric / Minor Patient Management []  - 0 Isolation Patient Management []  - 0 Hearing / Language / Visual special needs []  - 0 Assessment of Community  assistance (transportation, D/C planning, etc.) []  - 0 Additional assistance / Altered mentation []  - 0 Support Surface(s) Assessment (bed, cushion, seat, etc.) INTERVENTIONS - Wound Cleansing / Measurement []  - 0 Simple Wound Cleansing - one  wound []  - 0 Complex Wound Cleansing - multiple wounds []  - 0 Wound Imaging (photographs - any number of wounds) []  - 0 Wound Tracing (instead of photographs) []  - 0 Simple Wound Measurement - one wound []  - 0 Complex Wound Measurement - multiple wounds INTERVENTIONS - Wound Dressings []  - 0 Small Wound Dressing one or multiple wounds []  - 0 Medium Wound Dressing one or multiple wounds []  - 0 Large Wound Dressing one or multiple wounds []  - 0 Application of Medications - topical []  - 0 Application of Medications - injection INTERVENTIONS - Miscellaneous []  - 0 External ear exam []  - 0 Specimen Collection (cultures, biopsies, blood, body fluids, etc.) []  - 0 Specimen(s) / Culture(s) sent or taken to Lab for analysis []  - 0 Patient Transfer (multiple staff / Nurse, adult / Similar devices) []  - 0 Simple Staple / Suture removal (25 or less) []  - 0 Complex Staple / Suture removal (26 or more) []  - 0 Hypo / Hyperglycemic Management (close monitor of Blood Glucose) [name], [name] ([id number]) [id number] Page 3 of 8 []  - 0 Ankle / Brachial Index (ABI) - do not check if billed separately X- 1 5 Vital Signs Has the patient been seen at the hospital within the last three years: Yes Total Score: 65 Level Of Care: New/Established - Level 2 Electronic Signature(s) Signed: 04/02/2023 6:39:08 PM By: Shawn Stall RN, BSN Entered By: Shawn Stall on 04/02/2023 07:56:30 -------------------------------------------------------------------------------- Encounter Discharge Information Details Patient Name: Date of Service: [name] Pont, [name] [name] 04/02/2023 10:15 A M Medical Record Number: [id number] Patient Account  Number: [id number] Date of Birth/Sex: Treating RN: 10/08/1965 (57 y.o. [name] Primary Care Eileene Kisling: [name] Other Clinician: Referring Dejay Kronk: Treating Leopold Smyers/Extender: Donnetta Hail in Treatment: 14 Encounter Discharge Information Items Discharge Condition: Stable Ambulatory Status: Ambulatory Discharge Destination: Home Transportation: Private Auto Accompanied By: self Schedule Follow-up Appointment: Yes Clinical Summary of Care: Electronic Signature(s) Signed: 04/02/2023 6:39:08 PM By: Shawn Stall RN, BSN Entered By: Shawn Stall on 04/02/2023 07:57:20 -------------------------------------------------------------------------------- Lower Extremity Assessment Details Patient Name: Date of Service: [place], [name] [name] 04/02/2023 10:15 A M Medical Record Number: [id number] Patient Account Number: [id number] Date of Birth/Sex: Treating RN: 12/05/1965 (57 y.o. M) Primary Care Sheri Prows: [name] Other Clinician: Referring Ceil Roderick: Treating Brinklee Cisse/Extender: Donnetta Hail in Treatment: 14 Edema Assessment Assessed: [name]: No] Franne Forts: No] Edema: [Left: N] [Right: o] Calf Left: Right: Point of Measurement: 32 cm From Medial Instep 35.5 cm Ankle Left: Right: Point of Measurement: 13 cm From Medial Instep 22 cm Vascular Assessment SHEPHERD, DOUBLEDAY ([id number]) [Right:[id number] Page 4 of 8] Extremity colors, hair growth, and conditions: Extremity Color: [Left:Normal] Hair Growth on Extremity: [Left:Yes] Temperature of Extremity: [Left:Warm] Capillary Refill: [Left:< 3 seconds] Dependent Rubor: [Left:No No] Electronic Signature(s) Signed: 04/03/2023 12:25:24 PM By: Thayer Dallas Entered By: Thayer Dallas on 04/02/2023 07:22:44 -------------------------------------------------------------------------------- Multi Wound Chart Details Patient Name: Date of  Service: [name] Pont, [name] [name] 04/02/2023 10:15 A M Medical Record Number: [id number] Patient Account Number: [id number] Date of Birth/Sex: Treating RN: October 31, 1965 (57 y.o. 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 Secondary Etiology: 12/07/2022  Date Acquired: 67 Weeks of Treatment: Open Wound Status: No  Wound Recurrence: 0.5x0.6x0.2 Measurements L x W x D (cm) 0.236 A (cm) : rea 0.047 Volume (cm) : 53.10% % Reduction in A rea: 6.00% % Reduction in Volume: Grade 2 Classification: Medium Exudate A mount: Serous Exudate Type: amber Exudate Color:]  [N/A:N/A N/A N/A N/A N/A N/A N/A N/A N/A N/A N/A N/A N/A N/A N/A N/A N/A N/A] Treatment Notes Electronic Signature(s) Signed: 04/02/2023 4:50:14 PM By: Geralyn Corwin DO Entered By: Geralyn Corwin on 04/02/2023 09:58:12 Multi-Disciplinary Care Plan Details -------------------------------------------------------------------------------- [name] ([id number]) [id number] Page 5 of 8 Patient Name: Date of Service: [name], [name] 04/02/2023 10:15 A M Medical Record Number: [id number] Patient Account Number: [id number] Date of Birth/Sex: Treating RN: Dec 21, 1965 (57 y.o. [name], [name] Primary Care Anona Giovannini: [name] Other Clinician: Referring Faiz Weber: Treating Vincenzina Jagoda/Extender: Donnetta Hail in Treatment: 14 Active Inactive HBO Nursing Diagnoses: Potential for  barotraumas to ears, sinuses, teeth, and lungs or cerebral gas embolism related to changes in atmospheric pressure inside hyperbaric oxygen chamber Potential for oxygen toxicity seizures related to delivery of 100% oxygen at an increased atmospheric pressure Potential for pulmonary oxygen toxicity related to delivery of 100% oxygen at an increased atmospheric pressure Goals: Patient and/or family will be able to state/discuss factors appropriate to the management of their disease process during treatment Date Initiated: 02/16/2023 Target Resolution Date: 04/14/2023 Goal Status: Active Patient will tolerate the hyperbaric oxygen therapy treatment Date Initiated: 02/16/2023 Target Resolution Date: 04/14/2023 Goal Status: Active Patient/caregiver will verbalize understanding of HBO goals, rationale, procedures and potential hazards Date Initiated: 02/16/2023 Target Resolution Date: 04/14/2023 Goal Status: Active Interventions: Administer the correct therapeutic gas delivery based on the patients needs and limitations, per physician order Assess and provide for patients comfort related to the hyperbaric environment and equalization of middle ear Assess for signs and symptoms related to adverse events, including but not limited to confinement anxiety, pneumothorax, oxygen toxicity and baurotrauma Assess patient for any history of confinement anxiety Assess patient's knowledge and expectations regarding hyperbaric medicine and provide education related to the hyperbaric environment, goals of treatment and prevention of adverse events Implement protocols to decrease risk of pneumothorax in high risk patients Notes: Wound/Skin Impairment Nursing Diagnoses: Impaired tissue integrity Knowledge deficit related to smoking impact on wound healing Knowledge deficit related to ulceration/compromised skin integrity Goals: Patient will have a decrease in wound volume by X% from date: (specify in  notes) Date Initiated: 12/25/2022 Target Resolution Date: 04/14/2023 Goal Status: Active Patient/caregiver will verbalize understanding of skin care regimen Date Initiated: 12/25/2022 Target Resolution Date: 04/14/2023 Goal Status: Active Ulcer/skin breakdown will have a volume reduction of 30% by week 4 Date Initiated: 12/25/2022 Date Inactivated: 01/27/2023 Target Resolution Date: 01/22/2023 Unmet Reason: no major changes; Goal Status: Unmet continue current treatment. Interventions: Assess patient/caregiver ability to obtain necessary supplies Assess patient/caregiver ability to perform ulcer/skin care regimen upon admission and as needed Assess ulceration(s) every visit Provide education on smoking Provide education on ulcer and skin care Notes: Electronic Signature(s) Signed: 04/02/2023 6:39:08 PM By: Shawn Stall RN, BSN Entered By: Shawn Stall on 04/02/2023 07:42:40 [name], [name] ([id number]) [id number] Page 6 of 8 -------------------------------------------------------------------------------- Pain Assessment Details Patient Name: Date of Service: [name], [name] 04/02/2023 10:15 A M Medical Record Number: [id number] Patient Account Number: [id number] Date of Birth/Sex: Treating RN: April 15, 1966 (57 y.o. M) Primary Care Airelle Everding: [name] Other Clinician: Referring Jamariyah Johannsen: Treating Aarya Quebedeaux/Extender: Lance Muss  Weeks in Treatment: 14 Active Problems Location of Pain Severity and Description of Pain Patient Has Paino No Site Locations Pain Management and Medication Current Pain Management: Electronic Signature(s) Signed: 04/03/2023 12:25:24 PM By: Thayer Dallas Entered By: Thayer Dallas on 04/02/2023 07:22:11 -------------------------------------------------------------------------------- Patient/Caregiver Education Details Patient Name: Date of Service: [name] Pont, [name] [name] 8/29/2024andnbsp10:15 A  M Medical Record Number: [id number] Patient Account Number: [id number] Date of Birth/Gender: Treating RN: 02-06-1966 (57 y.o. [name] Primary Care Physician: [name] Other Clinician: Referring Physician: Treating Physician/Extender: Donnetta Hail in Treatment: 14 Education Assessment Education Provided To: Patient Education Topics Provided Wound/Skin Impairment: Handouts: Caring for Your Ulcer Methods: Explain/Verbal Responses: Reinforcements needed [name], [name] ([id number]) [phone].pdf Page 7 of 8 Electronic Signature(s) Signed: 04/02/2023 6:39:08 PM By: Shawn Stall RN, BSN Entered By: Shawn Stall on 04/02/2023 07:42:55 -------------------------------------------------------------------------------- Wound Assessment Details Patient Name: Date of Service: [place], [name] [name] 04/02/2023 10:15 A M Medical Record Number: [id number] Patient Account Number: [id number] Date of Birth/Sex: Treating RN: 1966/02/26 (57 y.o. 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 Wounding Event: Gradually Appeared Wound Status: Open Date Acquired: 12/07/2022 Weeks Of Treatment: 14 Clustered Wound: No Wound Measurements Length: (cm) 0.5 Width: (cm) 0.6 Depth: (cm) 0.2 Area: (cm) 0.236 Volume: (cm) 0.047 % Reduction in Area: 53.1% % Reduction in Volume: 6% Wound Description Classification: Exudate Amount: Exudate Type: Exudate Color: Grade 2 Medium Serous amber Periwound Skin Texture Texture Color No Abnormalities Noted: No No Abnormalities Noted: No Moisture No Abnormalities Noted: No Electronic Signature(s) Signed:  04/03/2023 12:25:24 PM By: Thayer Dallas Entered By: Thayer Dallas on 04/02/2023 07:23:12 -------------------------------------------------------------------------------- Vitals Details Patient Name: Date of Service: [name] Pont, [name] [name] 04/02/2023 10:15 A M Medical Record Number: [id number] Patient Account Number: [id number] Date of Birth/Sex: Treating RN: 1966-07-12 (57 y.o. M) Primary Care Lititia Sen: [name] Other Clinician: Referring Padraic Marinos: Treating Shaquna Geigle/Extender: Donnetta Hail in Treatment: [address] [name], [name] ([id number]) [id number] Page 8 of 8 Time Taken: 10:19 Temperature (F): 97.9 Height (in): 68 Pulse (bpm): 84 Weight (lbs): 200 Respiratory Rate (breaths/min): 18 Body Mass Index (BMI): 30.4 Blood Pressure (mmHg): 157/70 Capillary Blood Glucose (mg/dl): 573 Reference Range: 80 - 120 mg / dl Electronic Signature(s) Signed: 04/03/2023 12:25:24 PM By: Thayer Dallas Entered By: Thayer Dallas on 04/02/2023 07:21:36

## 2023-04-03 NOTE — Progress Notes (Addendum)
DEMOND, TAGUCHI ([id number]) [id number] Page 1 of 2 Visit Report for 04/03/2023 Arrival Information Details Patient Name: Date of Service: [place], [name] 04/03/2023 7:30 A M Medical Record Number: [id number] Patient Account Number: [id number] Date of Birth/Sex: Treating RN: 1966-07-22 (57 y.o. [name] Primary Care Ramzi Brathwaite: [name] Other Clinician: [name] Referring Robi Mitter: Treating Carrieann Spielberg/Extender: Donnetta Hail in Treatment: 14 Visit Information History Since Last Visit All ordered tests and consults were completed: Yes Patient Arrived: Ambulatory Added or deleted any medications: No Arrival Time: 07:39 Any new allergies or adverse reactions: No Accompanied By: significant other Had a fall or experienced change in No Transfer Assistance: None activities of daily living that may affect Patient Identification Verified: Yes risk of falls: Secondary Verification Process Completed: Yes Signs or symptoms of abuse/neglect since last visito No Patient Requires Transmission-Based Precautions: No Hospitalized since last visit: No Patient Has Alerts: No Implantable device outside of the clinic excluding No cellular tissue based products placed in the center since last visit: Pain Present Now: No Electronic Signature(s) Signed: 04/03/2023 3:43:57 PM By: [name] CHT EMT BS , , Previous Signature: 04/03/2023 12:22:13 PM Version By: [name] CHT EMT BS , , Entered By: [name] on 04/03/2023 12:43:57 -------------------------------------------------------------------------------- Encounter Discharge Information Details Patient Name: Date of Service: [name], [name] 04/03/2023 7:30 A M Medical Record Number: [id number] Patient Account Number: [id number] Date of Birth/Sex: Treating RN: 01-Feb-1966 (57 y.o. [name] Primary Care Khalani Novoa: [name] Other  Clinician: [name] Referring Caela Huot: Treating Aidenn Skellenger/Extender: Donnetta Hail in Treatment: 14 Encounter Discharge Information Items Discharge Condition: Stable Ambulatory Status: Ambulatory Discharge Destination: Home Transportation: Private Auto Accompanied By: significant other Schedule Follow-up Appointment: No Clinical Summary of Care: Electronic Signature(s) Signed: 04/03/2023 3:43:41 PM By: [name] CHT EMT BS , , Entered By: [name] on 04/03/2023 12:43:41 Zaldivar, [name] ([id number]) [id number] Page 2 of 2 -------------------------------------------------------------------------------- Vitals Details Patient Name: Date of Service: [place], [name] 04/03/2023 7:30 A M Medical Record Number: [id number] Patient Account Number: [id number] Date of Birth/Sex: Treating RN: Nov 25, 1965 (57 y.o. [name] Primary Care Kenijah Benningfield: [name] Other Clinician: Karl Bales Referring Sivan Cuello: Treating Veida Spira/Extender: Donnetta Hail in Treatment: 14 Vital Signs Time Taken: 07:44 Temperature (F): 97.7 Height (in): 68 Pulse (bpm): 72 Weight (lbs): 200 Respiratory Rate (breaths/min): 18 Body Mass Index (BMI): 30.4 Blood Pressure (mmHg): 131/57 Capillary Blood Glucose (mg/dl): 283 Reference Range: 80 - 120 mg / dl Electronic Signature(s) Signed: 04/03/2023 12:22:43 PM By: [name] CHT EMT BS , , Entered By: [name] on 04/03/2023 09:22:43

## 2023-04-07 ENCOUNTER — Encounter (HOSPITAL_BASED_OUTPATIENT_CLINIC_OR_DEPARTMENT_OTHER): Payer: 59 | Attending: Internal Medicine | Admitting: Internal Medicine

## 2023-04-07 DIAGNOSIS — Z794 Long term (current) use of insulin: Secondary | ICD-10-CM | POA: Insufficient documentation

## 2023-04-07 DIAGNOSIS — E1051 Type 1 diabetes mellitus with diabetic peripheral angiopathy without gangrene: Secondary | ICD-10-CM | POA: Insufficient documentation

## 2023-04-07 DIAGNOSIS — M069 Rheumatoid arthritis, unspecified: Secondary | ICD-10-CM | POA: Diagnosis not present

## 2023-04-07 DIAGNOSIS — S91302A Unspecified open wound, left foot, initial encounter: Secondary | ICD-10-CM

## 2023-04-07 DIAGNOSIS — E10621 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: Secondary | ICD-10-CM | POA: Insufficient documentation

## 2023-04-07 DIAGNOSIS — I70245 Atherosclerosis of native arteries of left leg with ulceration of other part of foot: Secondary | ICD-10-CM | POA: Diagnosis not present

## 2023-04-07 DIAGNOSIS — I10 Essential (primary) hypertension: Secondary | ICD-10-CM | POA: Diagnosis not present

## 2023-04-07 DIAGNOSIS — L97528 Non-pressure chronic ulcer of other part of left foot with other specified severity: Secondary | ICD-10-CM | POA: Diagnosis not present

## 2023-04-07 LAB — GLUCOSE, CAPILLARY
Glucose-Capillary: 159 mg/dL — ABNORMAL HIGH (ref 70–99)
Glucose-Capillary: 211 mg/dL — ABNORMAL HIGH (ref 70–99)

## 2023-04-07 NOTE — Progress Notes (Signed)
[name] ([id number]) [id number] Page 1 of 1 Visit Report for 04/07/2023 Arrival Information Details Patient Name: Date of Service: [name] 04/07/2023 7:30 A M Medical Record Number: [id number] Patient Account Number: [id number] Date of Birth/Sex: Treating RN: 23-Dec-1965 (57 y.o. [name], [name] Primary Care Mariann Palo: [name] Other Clinician: [name] Referring Kindrick Lankford: Treating Shakim Faith/Extender: Donnetta Hail in Treatment: 14 Visit Information History Since Last Visit All ordered tests and consults were completed: Yes Patient Arrived: Ambulatory Added or deleted any medications: No Arrival Time: 07:29 Any new allergies or adverse reactions: No Accompanied By: self Had a fall or experienced change in No Transfer Assistance: None activities of daily living that may affect Patient Identification Verified: Yes risk of falls: Secondary Verification Process Completed: Yes Signs or symptoms of abuse/neglect since last visito No Patient Requires Transmission-Based Precautions: No Hospitalized since last visit: No Patient Has Alerts: No Implantable device outside of the clinic excluding No cellular tissue based products placed in the center since last visit: Pain Present Now: No Electronic Signature(s) Signed: 04/07/2023 11:07:36 AM By: [name] CHT EMT BS , , Entered By: [name] on 04/07/2023 11:07:36 -------------------------------------------------------------------------------- Vitals Details Patient Name: Date of Service: [name] Pont, [name] 04/07/2023 7:30 A M Medical Record Number: [id number] Patient Account Number: [id number] Date of Birth/Sex: Treating RN: 01-20-1966 (57 y.o. [name] Primary Care Sion Thane: [name] Other Clinician: [name] Referring Remus Hagedorn: Treating Benz Vandenberghe/Extender: Donnetta Hail in  Treatment: 14 Vital Signs Time Taken: 07:43 Temperature (F): 97.9 Height (in): 68 Pulse (bpm): 73 Weight (lbs): 200 Respiratory Rate (breaths/min): 18 Body Mass Index (BMI): 30.4 Blood Pressure (mmHg): 121/63 Capillary Blood Glucose (mg/dl): 710 Reference Range: 80 - 120 mg / dl Electronic Signature(s) Signed: 04/07/2023 11:09:38 AM By: [name] CHT EMT BS , , Entered By: [name] on 04/07/2023 11:09:38

## 2023-04-07 NOTE — Progress Notes (Signed)
LEKEITH, SHUTE ([id number]) [id number] Page 1 of 2 Visit Report for 04/03/2023 [facility] Details Patient Name: Date of Service: [name], [name] 04/03/2023 7:30 A M Medical Record Number: [id number] Patient Account Number: [id number] Date of Birth/Sex: Treating RN: Sep 23, 1965 (57 y.o. [name] Primary Care Provider: [name] Other Clinician: [name] Referring Provider: Treating Provider/Extender: Donnetta Hail in Treatment: 14 Diagnosis Diagnosis: Ulcer of lower extremity, except decubitus Code: 707.1 TCOM Site Locations Site Locations TCOM Site Information Site #1 Left Medial Foot Superior to Calcaneus Location Description: Regional Perfusion Index: Baseline @2ATA : Measurement: Extremity Oxygen Oxygen Final 15 min Elevated Challenge Challenge 15 min 30 min 45 min 60 min 75 min 90 min Time: Reading 15 min 5 min 10 min 62 - - - [phone] 1001 1038 863 1032 mmHg: Air Breaks Given: Yes Electronic Signature(s) Signed: 04/03/2023 3:45:43 PM By: [name] CHT EMT BS , , Signed: 04/07/2023 3:11:03 PM By: Geralyn Corwin DO Previous Signature: 04/03/2023 3:45:09 PM Version By: [name] CHT EMT BS , , Entered By: [name] on 04/03/2023 12:45:43 -------------------------------------------------------------------------------- SuperBill Details Patient Name: Date of Service: [name], [name] 04/03/2023 Medical Record Number: [id number] Patient Account Number: [id number] Date of Birth/Sex: Treating RN: Dec 15, 1965 (57 y.o. 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 J44.9 Chronic obstructive pulmonary disease, unspecified M06.9 Rheumatoid arthritis, unspecified Z87.891 Personal history of nicotine dependence Facility Procedures : CPT4 Code: 13244010 Description: G0277-(Facility Use Only) HBOT full body chamber, , ICD-10 Diagnosis Description 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.528 Non-pressure chronic ulcer of other part of left foot  with other specified severity S91.302A Unspecified open wound, left foot, initial encounter 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 Modifier: Quantity: 4 : CPT4 Code: [id number] Description: 40347 - TCP02/ABI-SINGLE LEVEL ICD-10 Diagnosis Description 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.528 Non-pressure chronic ulcer of other part of left foot with other specified  severity S91.302A Unspecified open wound, left foot, initial encounter 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 Modifier: Quantity: 1 Physician Procedures : CPT4 Code Description Modifier [id number] - WC PHYS HYPERBARIC OXYGEN THERAPY ICD-10 Diagnosis Description 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.528 Non-pressure chronic ulcer of other part  of left foot with other specified severity S91.302A Unspecified open wound, left foot, initial encounter 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 Quantity: 1 Electronic Signature(s) Signed: 04/03/2023 12:29:28 PM By: [name] CHT EMT BS , , Signed: 04/07/2023 3:11:03 PM By: Geralyn Corwin DO Entered By: [name] on 04/03/2023  33:29:51

## 2023-04-07 NOTE — Progress Notes (Addendum)
[name] ([id number]) [id number] Page 1 of 2 Visit Report for 04/07/2023 HBO Details Patient Name: Date of Service: [name], [name] 04/07/2023 7:30 A M Medical Record Number: [id number] Patient Account Number: [id number] Date of Birth/Sex: Treating RN: [birth year] (57 y.o. [name] Primary Care [name]: [name] Other Clinician: Haywood [name] Referring [name]: Treating [name]: [name] in Treatment: 14 HBO Treatment Course Details Treatment Course Number: 1 Ordering [name]: [name] T Treatments Ordered: otal 30 HBO Treatment Start Date: 02/16/2023 HBO Indication: Other (specify in Notes) Notes: Acute peripheral arterial insufficiency HBO Treatment Details Treatment Number: 29 Patient Type: Outpatient Chamber Type: Monoplace Chamber Serial #: [id number] Treatment Protocol: 2.0 ATA with 90 minutes oxygen, with two 5 minute air breaks Treatment Details Compression Rate Down: 1.5 psi / minute De-Compression Rate Up: 2.0 psi / minute A breaks and breathing ir Compress Tx Pressure periods Decompress Decompress Begins Reached (leave unused spaces Begins Ends blank) Chamber Pressure (ATA 1 2 2 2 2 2  --2 1 ) Clock Time (24 hr) 08:06 08:20 08:50 08:55 09:25 09:30 - - 10:00 10:07 Treatment Length: 121 (minutes) Treatment Segments: 4 Vital Signs Capillary Blood Glucose Reference Range: 80 - 120 mg / dl HBO Diabetic Blood Glucose Intervention Range: <131 mg/dl or >782 mg/dl Type: Time Vitals Blood Respiratory Capillary Blood Glucose Pulse Action Pulse: Temperature: Taken: Pressure: Rate: Glucose (mg/dl): Meter #: Oximetry (%) Taken: Pre 07:43 121/63 73 18 97.9 159 none per protocol Post 10:15 111/86 74 18 97.7 211 none per protocol Treatment Response Treatment Toleration: Well Treatment Completion Status: Treatment Completed without Adverse Event Treatment Notes [name] arrived with normal vital signs. He stated he ate breakfast and detailed what he ate sausage egg cheese, protein shake. Upon completion of the safety check, patient was placed in the chamber which was compressed at 2 psi/min after confirming normal ear equalization. He tolerated the treatment and subsequent decompression of the chamber at a rate of 2 psi/min. He denied any issues with ear equalization and/or pain associated with barotrauma. Post- treatment vital signs were within normal range. He was stable upon discharge. Physician HBO Attestation: I certify that I supervised this HBO treatment in accordance with Medicare guidelines. A trained emergency response team is readily available per Yes hospital policies and procedures. Continue HBOT as ordered. Yes Electronic Signature(s) Signed: 04/08/2023 3:06:32 PM By: [name] CHT EMT BS , , Signed: 04/09/2023 5:29:38 PM By: [name] Corwin DO Previous Signature: 04/07/2023 2:04:51 PM Version By: [name] Corwin DO Previous Signature: 04/07/2023 11:33:26 AM Version By: [name] CHT EMT BS , , Entered By: [name] on 04/08/2023 12:06:32 [name] ([id number]) [id number] Page 2 of 2 -------------------------------------------------------------------------------- HBO Safety Checklist Details Patient Name: Date of Service: [name], [name] 04/07/2023 7:30 A M Medical Record Number: [id number] Patient Account Number: [id number] Date of Birth/Sex: Treating RN: 31-Jul-1966 (57 y.o. [name], [name] Primary Care [name]: [name] Other Clinician: Haywood [name] Referring [name]: Treating [name]: [name] in Treatment: 14 HBO Safety Checklist Items Safety Checklist Consent Form Signed Patient voided / foley secured and emptied When did you last eato 0700 Last dose of injectable or oral agent self Ostomy pouch emptied and vented  if applicable All implantable devices assessed, documented and approved Intravenous access site secured and place Valuables secured NA Linens and cotton and cotton/polyester blend (less than 51% polyester) Personal oil-based products / skin lotions / body lotions removed Wigs or hairpieces removed  NA Smoking or tobacco materials removed NA Books / newspapers / magazines / loose paper removed Cologne, aftershave, perfume and deodorant removed Jewelry removed (may wrap wedding band) Make-up removed Hair care products removed NA Battery operated devices (external) removed NA Heating patches and chemical warmers removed NA Titanium eyewear removed NA Nail polish cured greater than 10 hours NA Casting material cured greater than 10 hours NA Hearing aids removed NA Loose dentures or partials removed NA Prosthetics have been removed NA Patient demonstrates correct use of air break device (if applicable) Patient concerns have been addressed Patient grounding bracelet on and cord attached to chamber Specifics for Inpatients (complete in addition to above) Medication sheet sent with patient NA Intravenous medications needed or due during therapy sent with patient NA Drainage tubes (e.g. nasogastric tube or chest tube secured and vented) NA Endotracheal or Tracheotomy tube secured NA Cuff deflated of air and inflated with saline NA Airway suctioned NA Notes Paper version used prior to treatment start. Electronic Signature(s) Signed: 04/07/2023 11:11:56 AM By: [name] CHT EMT BS , , Entered By: [name] on 04/07/2023 08:11:56

## 2023-04-07 NOTE — Progress Notes (Signed)
[name], [name] ([id number]) [id number] Page 1 of 1 Visit Report for 04/07/2023 SuperBill Details Patient Name: Date of Service: [place], [name] 04/07/2023 Medical Record Number: [id number] Patient Account Number: [id number] Date of Birth/Sex: Treating RN: 12/09/1965 (57 y.o. 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 J44.9 Chronic obstructive pulmonary disease, unspecified M06.9 Rheumatoid arthritis, unspecified Z87.891 Personal history of nicotine dependence Facility Procedures CPT4 Code Description Modifier Quantity [id number] G0277-(Facility Use Only) HBOT full body chamber, , 4 ICD-10 Diagnosis Description 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 S91.302A Unspecified open wound, left foot, initial encounter 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 Physician Procedures Quantity CPT4 Code Description Modifier [id number] 99183 - WC PHYS HYPERBARIC OXYGEN THERAPY 1 ICD-10 Diagnosis Description 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

## 2023-04-08 ENCOUNTER — Encounter (HOSPITAL_BASED_OUTPATIENT_CLINIC_OR_DEPARTMENT_OTHER): Payer: 59 | Admitting: Internal Medicine

## 2023-04-08 ENCOUNTER — Telehealth: Payer: Self-pay | Admitting: Family

## 2023-04-08 ENCOUNTER — Encounter: Payer: Self-pay | Admitting: Family

## 2023-04-08 ENCOUNTER — Ambulatory Visit (INDEPENDENT_AMBULATORY_CARE_PROVIDER_SITE_OTHER): Payer: 59 | Admitting: Family

## 2023-04-08 DIAGNOSIS — L97423 Non-pressure chronic ulcer of left heel and midfoot with necrosis of muscle: Secondary | ICD-10-CM

## 2023-04-08 DIAGNOSIS — E10621 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: Secondary | ICD-10-CM | POA: Diagnosis not present

## 2023-04-08 LAB — GLUCOSE, CAPILLARY
Glucose-Capillary: 155 mg/dL — ABNORMAL HIGH (ref 70–99)
Glucose-Capillary: 273 mg/dL — ABNORMAL HIGH (ref 70–99)

## 2023-04-08 NOTE — Progress Notes (Signed)
Post-Op Visit Note   Patient: [name]           Date of Birth: November 26, 1965           MRN: [id number] Visit Date: 03/31/2023 PCP: Garlan Fillers, MD  Chief Complaint:  Chief Complaint  Patient presents with   Left Leg - Routine Post Op    03/04/23 left achilles debridement with graft    HPI:  HPI Patient is a 57 year old gentleman seen status post left Achilles debridement with [facility] application July 31.  He has had application of Kerecis in office weekly.  He is recently come pleated a course of Bactrim DS. Ortho Exam On examination of the left posterior heel the area of the Achilles debridement with Kerecis placement continues to improve.  Bleeding granulation in the wound bed.  No epiboly there is no surrounding erythema  Visit Diagnoses: No diagnosis found.  Plan: Kerecis micro powder applied again today.  He will leave this in place and change the overlying dressing for drainage daily follow-up in the office in 1 week.  Follow-Up Instructions: Return in about 1 week (around 04/07/2023).   Imaging: No results found.  Orders:  No orders of the defined types were placed in this encounter.  No orders of the defined types were placed in this encounter.    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 03/22/2020   Osteopenia of multiple sites 02/28/2020   PVD (peripheral vascular disease) (HCC) 10/11/2019   Chronic venous insufficiency 10/05/2018   Diabetic cataract (HCC) 05/21/2018   Controlled type 1 diabetes mellitus with diabetic peripheral angiopathy without gangrene (HCC) 08/14/2017   Dyslipidemia 03/26/2017   High risk medication use 09/23/2016   History of hypertension 09/23/2016   History of chronic kidney disease  09/23/2016   History of coronary artery disease 09/23/2016   Tobacco abuse 09/23/2016   Primary osteoarthritis of both knees 09/23/2016   History of diabetes mellitus 09/23/2016   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 (HCC) 11/20/2015   Chest pain with high risk for cardiac etiology 10/29/2015   Asymptomatic bilateral carotid artery stenosis 06/08/2014   Pleural effusion, bilateral 06/03/2014   Dyspnea 06/01/2014   Diabetes (HCC) 05/03/2014   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/03/2014   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   Chronic renal disease, stage 3, moderately decreased glomerular filtration rate between 30-59 mL/min/1.73 square meter (HCC) 05/03/2014   CAD (coronary artery disease) 04/27/2014   Acne 12/19/2013   On isotretinoin therapy 12/19/2013   Nuclear sclerotic cataract, bilateral 12/19/2011   Vitreous hemorrhage (HCC) 06/16/2011   Past Medical History:  Diagnosis Date   Anginal pain (HCC)    Anxiety    Arthritis    RA IN HANDS   Asthma    CAD (coronary artery disease) 04/27/2014   Chronic renal disease, stage 3, moderately decreased glomerular filtration rate between 30-59 mL/min/1.73 square meter (HCC) 05/03/2014   COPD (chronic obstructive pulmonary disease) (HCC)    Coronary artery disease    Diabetes (HCC) 05/03/2014   Diabetes mellitus without complication (HCC)    type 1   Hyperlipidemia    Left shoulder pain    Peripheral vascular  disease (HCC)    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/03/2014   Shortness of breath    Sleep apnea    mild OSA, no CPAP   Unstable angina pectoris (HCC) 04/25/2014    Family History  Problem Relation Age of Onset   Stomach cancer Mother    Cancer Mother    Prostate cancer Father    Heart disease Father    Cancer - Prostate Father    Heart disease Brother    Asthma Daughter    Healthy Daughter     Asthma Daughter     Past Surgical History:  Procedure Laterality Date   ABDOMINAL AORTOGRAM W/LOWER EXTREMITY N/A 01/15/2023   Procedure: ABDOMINAL AORTOGRAM W/LOWER EXTREMITY;  Surgeon: Cephus Shelling, MD;  Location: [facility] INVASIVE CV LAB;  Service: Cardiovascular;  Laterality: N/A;   CARDIAC CATHETERIZATION  04/25/2014   BY DR Jacinto Halim   CARDIAC CATHETERIZATION N/A 10/30/2015   Procedure: Left Heart Cath and Cors/Grafts Angiography;  Surgeon: Yates Decamp, MD;  Location: [facility] INVASIVE CV LAB;  Service: Cardiovascular;  Laterality: N/A;   CARPAL TUNNEL RELEASE     CATARACT EXTRACTION, BILATERAL     CORONARY ARTERY BYPASS GRAFT N/A 04/27/2014   Procedure: CORONARY ARTERY BYPASS GRAFTING on pump using left internal mammary artery to LAD coronary artery, right great saphenous vein graft to diagonal coronary artery with sequential to OM1 and circumflex coronary arteries. Right greater saphenous vein graft to posterior descending coronary artery. ;  Surgeon: Delight Ovens, MD;  Location: [facility] OR;  Service: Open Heart Surgery;  Laterality: N   elbow drained Left 05/09/2019   ENDOVEIN HARVEST OF GREATER SAPHENOUS VEIN Right 04/27/2014   Procedure: ENDOVEIN HARVEST OF GREATER SAPHENOUS VEIN;  Surgeon: Delight Ovens, MD;  Location: [facility] OR;  Service: Open Heart Surgery;  Laterality: Right;   EXCISION ORAL TUMOR N/A 03/01/2018   Procedure: EXCISION ORAL TUMOR;  Surgeon: Christia Reading, MD;  Location: [facility];  Service: ENT;  Laterality: N/A;   EYE SURGERY     LASER   EYE SURGERY Bilateral    astigmatism correction    HARDWARE REMOVAL Left 10/03/2020   Procedure: LEFT FOOT REMOVAL HARDWARE, PLACE VANC POWDER;  Surgeon: Nadara Mustard, MD;  Location: [facility] OR;  Service: Orthopedics;  Laterality: Left;   I & D EXTREMITY Left 03/04/2023   Procedure: LEFT ACHILLES DEBRIDEMENT;  Surgeon: Nadara Mustard, MD;  Location: [facility] OR;  Service: Orthopedics;  Laterality: Left;   INTRAOPERATIVE  TRANSESOPHAGEAL ECHOCARDIOGRAM N/A 04/27/2014   Procedure: INTRAOPERATIVE TRANSESOPHAGEAL ECHOCARDIOGRAM;  Surgeon: Delight Ovens, MD;  Location: [facility] OR;  Service: Open Heart Surgery;  Laterality: N/A;   IR RADIOLOGIST EVAL & MGMT  12/22/2022   LEFT HEART CATHETERIZATION WITH CORONARY ANGIOGRAM N/A 04/25/2014   Procedure: LEFT HEART CATHETERIZATION WITH CORONARY ANGIOGRAM;  Surgeon: Pamella Pert, MD;  Location: [facility] CATH LAB;  Service: Cardiovascular;  Laterality: N/A;   MOUTH SURGERY  11/15/2017   tongue surgery    ORIF TOE FRACTURE Left 03/22/2020   Procedure: OPEN REDUCTION INTERNAL FIXATION (ORIF) Non Union 5th Metatarsal;  Surgeon: Kathryne Hitch, MD;  Location: [facility];  Service: Orthopedics;  Laterality: Left;   Social History   Occupational History   Not on file  Tobacco Use   Smoking status: Former    Current packs/day: 0.25    Average packs/day: 0.3 packs/day for 34.0 years (8.5 ttl pk-yrs)    Types: Cigarettes   Smokeless tobacco:  Never  Vaping Use   Vaping status: Never Used  Substance and Sexual Activity   Alcohol use: Not Currently    Comment: RARE   Drug use: No   Sexual activity: Not on file

## 2023-04-08 NOTE — Progress Notes (Signed)
EPHREM, KNISELEY ([id number]) [id number] Page 1 of 2 Visit Report for 04/08/2023 HBO Details Patient Name: Date of Service: [place], [name] 04/08/2023 7:30 A M Medical Record Number: [id number] Patient Account Number: [id number] Date of Birth/Sex: Treating RN: [birth year] (57 y.o. [name], [name] Primary Care Sanaiyah Kirchhoff: [name] Other Clinician: [name] Referring Chanler Mendonca: Treating Shirley Decamp/Extender: [name] in Treatment: 14 HBO Treatment Course Details Treatment Course Number: 1 Ordering Aissata Wilmore: Geralyn Corwin T Treatments Ordered: otal 30 HBO Treatment Start Date: 02/16/2023 HBO Indication: Other (specify in Notes) Notes: Acute peripheral arterial insufficiency HBO Treatment Details Treatment Number: 30 Patient Type: Outpatient Chamber Type: Monoplace Chamber Serial #: [id number] Treatment Protocol: 2.0 ATA with 90 minutes oxygen, with two 5 minute air breaks Treatment Details Compression Rate Down: 1.5 psi / minute De-Compression Rate Up: 2.0 psi / minute A breaks and breathing ir Compress Tx Pressure periods Decompress Decompress Begins Reached (leave unused spaces Begins Ends blank) Chamber Pressure (ATA 1 2 2 2 2 2  --2 1 ) Clock Time (24 hr) 08:18 08:29 08:59 09:04 09:34 09:39 - - 10:09 10:17 Treatment Length: 119 (minutes) Treatment Segments: 4 Vital Signs Capillary Blood Glucose Reference Range: 80 - 120 mg / dl HBO Diabetic Blood Glucose Intervention Range: <131 mg/dl or >782 mg/dl Type: Time Vitals Blood Respiratory Capillary Blood Glucose Pulse Action Pulse: Temperature: Taken: Pressure: Rate: Glucose (mg/dl): Meter #: Oximetry (%) Taken: Pre 07:51 120/52 72 18 98.2 155 Post 10:23 123/60 73 18 98.1 273 none per protocol Treatment Response Treatment Toleration: Well Treatment Completion Status: Treatment Completed without Adverse Event Treatment Notes Mr. Mogg arrived with normal  vital signs. He stated he ate breakfast and detailed what he ate. Upon completion of the safety check, patient was placed in the chamber which was compressed at 2 psi/min after confirming normal ear equalization. He tolerated the treatment and subsequent decompression of the chamber at a rate of 2 psi/min. He denied any issues with ear equalization and/or pain associated with barotrauma. Post-treatment vital signs were within normal range. He was stable upon discharge. Electronic Signature(s) Signed: 04/08/2023 2:48:21 PM By: [name] CHT EMT BS , , Signed: 04/08/2023 6:11:34 PM By: Allen Derry PA-C Previous Signature: 04/08/2023 12:08:12 PM Version By: [name] CHT EMT BS , , Entered By: [name] on 04/08/2023 11:48:20 Trickett, [name] ([id number]) [id number] Page 2 of 2 -------------------------------------------------------------------------------- HBO Safety Checklist Details Patient Name: Date of Service: [place], [name] 04/08/2023 7:30 A M Medical Record Number: [id number] Patient Account Number: [id number] Date of Birth/Sex: Treating RN: Oct 21, 1965 (57 y.o. [name], [name] Primary Care Fadumo Heng: [name] Other Clinician: [name] Referring Tomeshia Pizzi: Treating Henok Heacock/Extender: [name] in Treatment: 14 HBO Safety Checklist Items Safety Checklist Consent Form Signed Patient voided / foley secured and emptied When did you last eato 0700 - SEC Biscuit, coffee, OJ Last dose of injectable or oral agent 0700- 17 unit Lantus Ostomy pouch emptied and vented if applicable NA All implantable devices assessed, documented and approved Freestyle Libre 2 and3 on approved list Intravenous access site secured and place NA Valuables secured Linens and cotton and cotton/polyester blend (less than 51% polyester) Personal oil-based products / skin lotions / body lotions removed Wigs or hairpieces  removed NA Smoking or tobacco materials removed NA Books / newspapers / magazines / loose paper removed Cologne, aftershave, perfume and deodorant removed Jewelry removed (may wrap wedding band) Make-up removed NA Hair care products removed Battery  operated devices (external) removed Heating patches and chemical warmers removed Titanium eyewear removed Nail polish cured greater than 10 hours NA Casting material cured greater than 10 hours NA Hearing aids removed NA Loose dentures or partials removed NA Prosthetics have been removed NA Patient demonstrates correct use of air break device (if applicable) Patient concerns have been addressed Patient grounding bracelet on and cord attached to chamber Specifics for Inpatients (complete in addition to above) Medication sheet sent with patient NA Intravenous medications needed or due during therapy sent with patient NA Drainage tubes (e.g. nasogastric tube or chest tube secured and vented) NA Endotracheal or Tracheotomy tube secured NA Cuff deflated of air and inflated with saline NA Airway suctioned NA Notes Paper version used prior to treatment start. Electronic Signature(s) Signed: 04/08/2023 2:48:06 PM By: [name] CHT EMT BS , , Previous Signature: 04/08/2023 12:05:17 PM Version By: [name] CHT EMT BS , , Entered By: [name] on 04/08/2023 11:48:06

## 2023-04-08 NOTE — Progress Notes (Signed)
Post-Op Visit Note   Patient: [name]           Date of Birth: 07-Feb-1966           MRN: [id number] Visit Date: 04/08/2023 PCP: Garlan Fillers, MD  Chief Complaint:  Chief Complaint  Patient presents with   Left Leg - Routine Post Op    03/04/23 left achilles debridement    HPI:  HPI The patient is a 57 year old gentleman who is seen in follow-up status post left Achilles debridement on July 31 he has had Kerecis micro powder applied in the office, donated.  Was last seen 1 week ago. Ortho Exam On examination of the left Achilles area of the ulcer is 100% filled in with granulation there is good uptake of the Kerecis powder material there is circumferential epithelialization about 1 to 2 mm in diameter.  No erythema warmth or sign of infection  Visit Diagnoses: No diagnosis found.  Plan: Will change overlying gauze daily as instructed last week he will follow-up in the office in 1 more week for reevaluation  Follow-Up Instructions: Return in about 1 week (around 04/15/2023).   Imaging: No results found.  Orders:  No orders of the defined types were placed in this encounter.  No orders of the defined types were placed in this encounter.    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 03/22/2020   Osteopenia of multiple sites 02/28/2020   PVD (peripheral vascular disease) (HCC) 10/11/2019   Chronic venous insufficiency 10/05/2018   Diabetic cataract (HCC) 05/21/2018   Controlled type 1 diabetes mellitus with diabetic peripheral angiopathy without gangrene (HCC) 08/14/2017   Dyslipidemia 03/26/2017   High risk medication use 09/23/2016   History of hypertension 09/23/2016   History of chronic kidney disease 09/23/2016   History of  coronary artery disease 09/23/2016   Tobacco abuse 09/23/2016   Primary osteoarthritis of both knees 09/23/2016   History of diabetes mellitus 09/23/2016   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 (HCC) 11/20/2015   Chest pain with high risk for cardiac etiology 10/29/2015   Asymptomatic bilateral carotid artery stenosis 06/08/2014   Pleural effusion, bilateral 06/03/2014   Dyspnea 06/01/2014   Diabetes (HCC) 05/03/2014   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/03/2014   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   Chronic renal disease, stage 3, moderately decreased glomerular filtration rate between 30-59 mL/min/1.73 square meter (HCC) 05/03/2014   CAD (coronary artery disease) 04/27/2014   Acne 12/19/2013   On isotretinoin therapy 12/19/2013   Nuclear sclerotic cataract, bilateral 12/19/2011   Vitreous hemorrhage (HCC) 06/16/2011   Past Medical History:  Diagnosis Date   Anginal pain (HCC)    Anxiety    Arthritis    RA IN HANDS   Asthma    CAD (coronary artery disease) 04/27/2014   Chronic renal disease, stage 3, moderately decreased glomerular filtration rate between 30-59 mL/min/1.73 square meter (HCC) 05/03/2014   COPD (chronic obstructive pulmonary disease) (HCC)    Coronary artery disease    Diabetes (HCC) 05/03/2014   Diabetes mellitus without complication (HCC)    type 1   Hyperlipidemia    Left shoulder pain    Peripheral  vascular disease (HCC)    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/03/2014   Shortness of breath    Sleep apnea    mild OSA, no CPAP   Unstable angina pectoris (HCC) 04/25/2014    Family History  Problem Relation Age of Onset   Stomach cancer Mother    Cancer Mother    Prostate cancer Father    Heart disease Father    Cancer - Prostate Father    Heart disease Brother    Asthma Daughter    Healthy Daughter    Asthma Daughter      Past Surgical History:  Procedure Laterality Date   ABDOMINAL AORTOGRAM W/LOWER EXTREMITY N/A 01/15/2023   Procedure: ABDOMINAL AORTOGRAM W/LOWER EXTREMITY;  Surgeon: Cephus Shelling, MD;  Location: [facility] INVASIVE CV LAB;  Service: Cardiovascular;  Laterality: N/A;   CARDIAC CATHETERIZATION  04/25/2014   BY DR Jacinto Halim   CARDIAC CATHETERIZATION N/A 10/30/2015   Procedure: Left Heart Cath and Cors/Grafts Angiography;  Surgeon: Yates Decamp, MD;  Location: [facility] INVASIVE CV LAB;  Service: Cardiovascular;  Laterality: N/A;   CARPAL TUNNEL RELEASE     CATARACT EXTRACTION, BILATERAL     CORONARY ARTERY BYPASS GRAFT N/A 04/27/2014   Procedure: CORONARY ARTERY BYPASS GRAFTING on pump using left internal mammary artery to LAD coronary artery, right great saphenous vein graft to diagonal coronary artery with sequential to OM1 and circumflex coronary arteries. Right greater saphenous vein graft to posterior descending coronary artery. ;  Surgeon: Delight Ovens, MD;  Location: [facility] OR;  Service: Open Heart Surgery;  Laterality: N   elbow drained Left 05/09/2019   ENDOVEIN HARVEST OF GREATER SAPHENOUS VEIN Right 04/27/2014   Procedure: ENDOVEIN HARVEST OF GREATER SAPHENOUS VEIN;  Surgeon: Delight Ovens, MD;  Location: [facility] OR;  Service: Open Heart Surgery;  Laterality: Right;   EXCISION ORAL TUMOR N/A 03/01/2018   Procedure: EXCISION ORAL TUMOR;  Surgeon: Christia Reading, MD;  Location: [facility];  Service: ENT;  Laterality: N/A;   EYE SURGERY     LASER   EYE SURGERY Bilateral    astigmatism correction    HARDWARE REMOVAL Left 10/03/2020   Procedure: LEFT FOOT REMOVAL HARDWARE, PLACE VANC POWDER;  Surgeon: Nadara Mustard, MD;  Location: [facility] OR;  Service: Orthopedics;  Laterality: Left;   I & D EXTREMITY Left 03/04/2023   Procedure: LEFT ACHILLES DEBRIDEMENT;  Surgeon: Nadara Mustard, MD;  Location: [facility] OR;  Service: Orthopedics;  Laterality: Left;   INTRAOPERATIVE TRANSESOPHAGEAL ECHOCARDIOGRAM N/A  04/27/2014   Procedure: INTRAOPERATIVE TRANSESOPHAGEAL ECHOCARDIOGRAM;  Surgeon: Delight Ovens, MD;  Location: [facility] OR;  Service: Open Heart Surgery;  Laterality: N/A;   IR RADIOLOGIST EVAL & MGMT  12/22/2022   LEFT HEART CATHETERIZATION WITH CORONARY ANGIOGRAM N/A 04/25/2014   Procedure: LEFT HEART CATHETERIZATION WITH CORONARY ANGIOGRAM;  Surgeon: Pamella Pert, MD;  Location: [facility] CATH LAB;  Service: Cardiovascular;  Laterality: N/A;   MOUTH SURGERY  11/15/2017   tongue surgery    ORIF TOE FRACTURE Left 03/22/2020   Procedure: OPEN REDUCTION INTERNAL FIXATION (ORIF) Non Union 5th Metatarsal;  Surgeon: Kathryne Hitch, MD;  Location: [facility];  Service: Orthopedics;  Laterality: Left;   Social History   Occupational History   Not on file  Tobacco Use   Smoking status: Former    Current packs/day: 0.25    Average packs/day: 0.3 packs/day for 34.0 years (8.5 ttl pk-yrs)    Types: Cigarettes   Smokeless  tobacco: Never  Vaping Use   Vaping status: Never Used  Substance and Sexual Activity   Alcohol use: Not Currently    Comment: RARE   Drug use: No   Sexual activity: Not on file

## 2023-04-08 NOTE — Progress Notes (Addendum)
SATHYA, SCHLICHER ([id number]) [id number] Page 1 of 2 Visit Report for 04/08/2023 Arrival Information Details Patient Name: Date of Service: [place], [name] 04/08/2023 7:30 A M Medical Record Number: [id number] Patient Account Number: [id number] Date of Birth/Sex: Treating RN: October 04, 1965 (57 y.o. [name], [name] Primary Care Riki Gehring: [name] Other Clinician: [name] Referring Dashiel Bergquist: Treating Kailia Starry/Extender: [name] in Treatment: 14 Visit Information History Since Last Visit All ordered tests and consults were completed: Yes Patient Arrived: Ambulatory Added or deleted any medications: No Arrival Time: 09:51 Any new allergies or adverse reactions: No Accompanied By: self Had a fall or experienced change in No Transfer Assistance: None activities of daily living that may affect Patient Identification Verified: Yes risk of falls: Secondary Verification Process Completed: Yes Signs or symptoms of abuse/neglect since last visito No Patient Requires Transmission-Based Precautions: No Hospitalized since last visit: No Patient Has Alerts: No Implantable device outside of the clinic excluding No cellular tissue based products placed in the center since last visit: Pain Present Now: No Electronic Signature(s) Signed: 04/08/2023 2:47:45 PM By: [name] CHT EMT BS , , Previous Signature: 04/08/2023 11:59:45 AM Version By: [name] CHT EMT BS , , Entered By: [name] on 04/08/2023 11:47:45 -------------------------------------------------------------------------------- Encounter Discharge Information Details Patient Name: Date of Service: [name], [name] 04/08/2023 7:30 A M Medical Record Number: [id number] Patient Account Number: [id number] Date of Birth/Sex: Treating RN: 1965-12-23 (57 y.o. [name] Primary Care Venera Privott: [name] Other Clinician: [name] Referring Arlynn Stare: Treating Neomia Herbel/Extender: [name] in Treatment: 14 Encounter Discharge Information Items Discharge Condition: Stable Ambulatory Status: Ambulatory Discharge Destination: Home Transportation: Private Auto Accompanied By: self Schedule Follow-up Appointment: No Clinical Summary of Care: Electronic Signature(s) Signed: 04/08/2023 2:49:39 PM By: [name] CHT EMT BS , , Previous Signature: 04/08/2023 12:33:33 PM Version By: [name] CHT EMT BS , , Entered By: [name] on 04/08/2023 11:49:39 Jowett, [name] ([id number]) [id number] Page 2 of 2 -------------------------------------------------------------------------------- Vitals Details Patient Name: Date of Service: [place], [name] 04/08/2023 7:30 A M Medical Record Number: [id number] Patient Account Number: [id number] Date of Birth/Sex: Treating RN: 03-21-1966 (57 y.o. [name], [name] Primary Care Marigene Erler: [name] Other Clinician: Karl Bales Referring Stuart Mirabile: Treating Ambrosio Reuter/Extender: [name] in Treatment: 14 Vital Signs Time Taken: 07:51 Temperature (F): 98.2 Height (in): 68 Pulse (bpm): 72 Weight (lbs): 200 Respiratory Rate (breaths/min): 18 Body Mass Index (BMI): 30.4 Blood Pressure (mmHg): 120/52 Capillary Blood Glucose (mg/dl): 371 Reference Range: 80 - 120 mg / dl Electronic Signature(s) Signed: 04/08/2023 2:47:53 PM By: [name] CHT EMT BS , , Previous Signature: 04/08/2023 12:02:04 PM Version By: [name] CHT EMT BS , , Entered By: [name] on 04/08/2023 11:47:53

## 2023-04-08 NOTE — Telephone Encounter (Signed)
Pt was seen toda\y post op. Denny Peon would like to see pt in 1 wk. No openings for her or [name]. Please call pt with date and time for next week. Pt phone number is [phone]

## 2023-04-09 NOTE — Progress Notes (Signed)
KIANO, SPIERING ([id number]) [id number] Page 1 of 1 Visit Report for 04/08/2023 SuperBill Details Patient Name: Date of Service: [place], [name] 04/08/2023 Medical Record Number: [id number] Patient Account Number: [id number] Date of Birth/Sex: Treating RN: 03-12-1966 (57 y.o. [name], [name] Primary Care Provider: [name] Other Clinician: [name] Referring Provider: Treating Provider/Extender: [name] in Treatment: 14 Diagnosis Coding ICD-10 Codes Code Description [phone] Unspecified open wound, left foot, initial encounter L97.528 Non-pressure chronic ulcer of other part of left foot with other specified severity I70.245 Atherosclerosis of native arteries of left leg with ulceration of other part of foot 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 J44.9 Chronic obstructive pulmonary disease, unspecified M06.9 Rheumatoid arthritis, unspecified Z87.891 Personal history of nicotine dependence Facility Procedures CPT4 Code Description Modifier Quantity 13244010 G0277-(Facility Use Only) HBOT full body chamber, , 4 ICD-10 Diagnosis Description 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 S91.302A Unspecified open wound, left foot, initial encounter 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 Physician Procedures Quantity CPT4 Code Description Modifier [id number] 99183 - WC PHYS HYPERBARIC OXYGEN THERAPY 1 ICD-10 Diagnosis Description 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 S91.302A Unspecified open wound, left foot, initial encounter 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 Electronic  Signature(s) Signed: 04/08/2023 2:48:39 PM By: [name] CHT EMT BS , , Signed: 04/08/2023 6:11:34 PM By: Allen Derry PA-C Previous Signature: 04/08/2023 12:33:10 PM Version By: [name] CHT EMT BS , , Entered By: [name] on 04/08/2023 11:48:38

## 2023-04-10 NOTE — Telephone Encounter (Signed)
Called pt to schedule an apt for next week. The number we have is not a working number.

## 2023-04-13 NOTE — Telephone Encounter (Signed)
Pt called office, he is scheduled Wednesday at 2:30 with Erin.

## 2023-04-14 ENCOUNTER — Encounter (HOSPITAL_BASED_OUTPATIENT_CLINIC_OR_DEPARTMENT_OTHER): Payer: 59 | Admitting: Internal Medicine

## 2023-04-14 DIAGNOSIS — E10621 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: Secondary | ICD-10-CM | POA: Diagnosis not present

## 2023-04-14 DIAGNOSIS — I70245 Atherosclerosis of native arteries of left leg with ulceration of other part of foot: Secondary | ICD-10-CM | POA: Diagnosis not present

## 2023-04-14 DIAGNOSIS — S91302A Unspecified open wound, left foot, initial encounter: Secondary | ICD-10-CM

## 2023-04-14 DIAGNOSIS — L97528 Non-pressure chronic ulcer of other part of left foot with other specified severity: Secondary | ICD-10-CM

## 2023-04-14 LAB — GLUCOSE, CAPILLARY
Glucose-Capillary: 151 mg/dL — ABNORMAL HIGH (ref 70–99)
Glucose-Capillary: 133 mg/dL — ABNORMAL HIGH (ref 70–99)

## 2023-04-14 NOTE — Progress Notes (Signed)
ABLE, JACUINDE ([id number]) [id number] Page 1 of 1 Visit Report for 04/14/2023 SuperBill Details Patient Name: Date of Service: [name], [name] 04/14/2023 Medical Record Number: [id number] Patient Account Number: [id number] Date of Birth/Sex: Treating RN: 09/09/1965 (57 y.o. [name] Primary Care Provider: [name] Other Clinician: [name] Referring Provider: Treating Provider/Extender: Donnetta Hail in Treatment: 15 Diagnosis Coding ICD-10 Codes Code Description [phone] Unspecified open wound, left foot, initial encounter L97.528 Non-pressure chronic ulcer of other part of left foot with other specified severity I70.245 Atherosclerosis of native arteries of left leg with ulceration of other part of foot 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 J44.9 Chronic obstructive pulmonary disease, unspecified M06.9 Rheumatoid arthritis, unspecified Z87.891 Personal history of nicotine dependence Facility Procedures CPT4 Code Description Modifier Quantity [id number] G0277-(Facility Use Only) HBOT full body chamber, , 4 ICD-10 Diagnosis Description 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 S91.302A Unspecified open wound, left foot, initial encounter 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 Physician Procedures Quantity CPT4 Code Description Modifier [id number] 99183 - WC PHYS HYPERBARIC OXYGEN THERAPY 1 ICD-10 Diagnosis Description 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

## 2023-04-14 NOTE — Progress Notes (Addendum)
[name], [name] ([id number]) [id number] Page 1 of 2 Visit Report for 04/14/2023 Arrival Information Details Patient Name: Date of Service: [name] 04/14/2023 7:30 A M Medical Record Number: [id number] Patient Account Number: [id number] Date of Birth/Sex: Treating RN: 1966-03-28 (57 y.o. [name] Primary Care Janiyha Montufar: [name] Other Clinician: [name] Referring Yarah Fuente: Treating Deondre Marinaro/Extender: Donnetta Hail in Treatment: 15 Visit Information History Since Last Visit All ordered tests and consults were completed: Yes Patient Arrived: Ambulatory Added or deleted any medications: No Arrival Time: 07:40 Any new allergies or adverse reactions: No Accompanied By: self Had a fall or experienced change in No Transfer Assistance: None activities of daily living that may affect Patient Identification Verified: Yes risk of falls: Secondary Verification Process Completed: Yes Signs or symptoms of abuse/neglect since last visito No Patient Requires Transmission-Based Precautions: No Hospitalized since last visit: No Patient Has Alerts: No Implantable device outside of the clinic excluding No cellular tissue based products placed in the center since last visit: Pain Present Now: No Electronic Signature(s) Signed: 04/14/2023 11:35:19 AM By: [name] CHT EMT BS , , Entered By: [name] on 04/14/2023 08:35:19 -------------------------------------------------------------------------------- Encounter Discharge Information Details Patient Name: Date of Service: [name] Pont, [name] 04/14/2023 7:30 A M Medical Record Number: [id number] Patient Account Number: [id number] Date of Birth/Sex: Treating RN: 06-07-1966 (57 y.o. [name] Primary Care Ardis Fullwood: [name] Other Clinician: [name] Referring Nakina Spatz: Treating Latisia Hilaire/Extender: Donnetta Hail in Treatment: 15 Encounter Discharge Information Items Discharge Condition: Stable Ambulatory Status: Ambulatory Discharge Destination: Home Transportation: Private Auto Accompanied By: self Schedule Follow-up Appointment: No Clinical Summary of Care: Electronic Signature(s) Signed: 04/14/2023 11:49:34 AM By: [name] CHT EMT BS , , Entered By: [name] on 04/14/2023 08:49:34 [name] ([id number]) [id number] Page 2 of 2 -------------------------------------------------------------------------------- Vitals Details Patient Name: Date of Service: [name], [name] 04/14/2023 7:30 A M Medical Record Number: [id number] Patient Account Number: [id number] Date of Birth/Sex: Treating RN: 1966/07/21 (57 y.o. [name] Primary Care Casmira Cramer: [name] Other Clinician: [name] Referring Newton Frutiger: Treating Akeel Reffner/Extender: Donnetta Hail in Treatment: 15 Vital Signs Time Taken: 07:53 Temperature (F): 97.5 Height (in): 68 Pulse (bpm): 79 Weight (lbs): 200 Respiratory Rate (breaths/min): 18 Body Mass Index (BMI): 30.4 Blood Pressure (mmHg): 117/56 Capillary Blood Glucose (mg/dl): 433 Reference Range: 80 - 120 mg / dl Electronic Signature(s) Signed: 04/14/2023 11:35:59 AM By: [name] CHT EMT BS , , Entered By: [name] on 04/14/2023 08:35:59

## 2023-04-14 NOTE — Progress Notes (Addendum)
DAREUS, SUNDET ([id number]) [id number] Page 1 of 2 Visit Report for 04/14/2023 HBO Details Patient Name: Date of Service: [place], [name] 04/14/2023 7:30 A M Medical Record Number: [id number] Patient Account Number: [id number] Date of Birth/Sex: Treating RN: 11/12/1965 (57 y.o. [name] Primary Care Yvone Slape: [name] Other Clinician: [name] Referring Rajan Burgard: Treating Camellia Popescu/Extender: Donnetta Hail in Treatment: 15 HBO Treatment Course Details Treatment Course Number: 1 Ordering Kelsay Haggard: Geralyn Corwin T Treatments Ordered: otal 60 HBO Treatment Start Date: 02/16/2023 HBO Indication: Other (specify in Notes) Notes: Acute peripheral arterial insufficiency HBO Treatment Details Treatment Number: 31 Patient Type: Outpatient Chamber Type: Monoplace Chamber Serial #: [id number] Treatment Protocol: 2.0 ATA with 90 minutes oxygen, with two 5 minute air breaks Treatment Details Compression Rate Down: 1.5 psi / minute De-Compression Rate Up: 2.0 psi / minute A breaks and breathing ir Compress Tx Pressure periods Decompress Decompress Begins Reached (leave unused spaces Begins Ends blank) Chamber Pressure (ATA 1 2 2 2 2 2  --2 1 ) Clock Time (24 hr) 08:11 08:23 08:53 08:58 09:28 09:33 - - 10:03 10:11 Treatment Length: 120 (minutes) Treatment Segments: 4 Vital Signs Capillary Blood Glucose Reference Range: 80 - 120 mg / dl HBO Diabetic Blood Glucose Intervention Range: <131 mg/dl or >782 mg/dl Type: Time Vitals Blood Pulse: Respiratory Temperature: Capillary Blood Glucose Pulse Action Taken: Pressure: Rate: Glucose (mg/dl): Meter #: Oximetry (%) Taken: Pre 07:53 117/56 79 18 97.5 151 asymptomatic for hypotension Post 10:19 134/62 72 18 97.8 133 none per protocol Treatment Response Treatment Toleration: Well Treatment Completion Status: Treatment Completed without Adverse Event Treatment  Notes Mr. Choudhury arrived with normal vital signs except diastolic BP of 56 mmHg. He denied symptoms of hypotension. He stated he ate breakfast and detailed what he ate. Upon completion of the safety check, patient was placed in the chamber which was compressed at 2 psi/min after confirming normal ear equalization. He tolerated the treatment and subsequent decompression of the chamber at a rate of 2 psi/min. He denied any issues with ear equalization and/or pain associated with barotrauma. Post-treatment vital signs were within normal range. He was stable upon discharge. Physician HBO Attestation: I certify that I supervised this HBO treatment in accordance with Medicare guidelines. A trained emergency response team is readily available per Yes hospital policies and procedures. Continue HBOT as ordered. Yes Electronic Signature(s) Signed: 04/16/2023 3:50:21 PM By: Geralyn Corwin DO Previous Signature: 04/14/2023 2:32:51 PM Version By: [name] CHT EMT BS , , Previous Signature: 04/14/2023 2:44:09 PM Version By: Geralyn Corwin DO Entered By: Geralyn Corwin on 04/16/2023 10:27:15 [name] ([id number]) [id number] Page 2 of 2 -------------------------------------------------------------------------------- HBO Safety Checklist Details Patient Name: Date of Service: [place], [name] 04/14/2023 7:30 A M Medical Record Number: [id number] Patient Account Number: [id number] Date of Birth/Sex: Treating RN: 1966-02-13 (57 y.o. [name] Primary Care Ahaana Rochette: [name] Other Clinician: [name] Referring Deone Omahoney: Treating Blayre Papania/Extender: Donnetta Hail in Treatment: 15 HBO Safety Checklist Items Safety Checklist Consent Form Signed Patient voided / foley secured and emptied When did you last eato 0700- Sausage Egg Cheese Biscuit Last dose of injectable or oral agent 0630 17 Units Lantus Ostomy  pouch emptied and vented if applicable NA All implantable devices assessed, documented and approved Freestyle Libre 2 and3 on approved list Intravenous access site secured and place NA Valuables secured NA Linens and cotton and cotton/polyester blend (less than 51% polyester) Personal oil-based products / skin  lotions / body lotions removed Wigs or hairpieces removed NA Smoking or tobacco materials removed NA Books / newspapers / magazines / loose paper removed Cologne, aftershave, perfume and deodorant removed Jewelry removed (may wrap wedding band) Make-up removed NA Hair care products removed Battery operated devices (external) removed Heating patches and chemical warmers removed Titanium eyewear removed Nail polish cured greater than 10 hours NA Casting material cured greater than 10 hours NA Hearing aids removed NA Loose dentures or partials removed NA Prosthetics have been removed NA Patient demonstrates correct use of air break device (if applicable) Patient concerns have been addressed Patient grounding bracelet on and cord attached to chamber Specifics for Inpatients (complete in addition to above) Medication sheet sent with patient NA Intravenous medications needed or due during therapy sent with patient NA Drainage tubes (e.g. nasogastric tube or chest tube secured and vented) NA Endotracheal or Tracheotomy tube secured NA Cuff deflated of air and inflated with saline NA Airway suctioned NA Notes Paper version used prior to treatment start. Electronic Signature(s) Signed: 04/14/2023 11:37:49 AM By: [name] CHT EMT BS , , Entered By: [name] on 04/14/2023 08:37:49

## 2023-04-15 ENCOUNTER — Encounter: Payer: Self-pay | Admitting: Family

## 2023-04-15 ENCOUNTER — Ambulatory Visit (INDEPENDENT_AMBULATORY_CARE_PROVIDER_SITE_OTHER): Payer: 59 | Admitting: Family

## 2023-04-15 ENCOUNTER — Encounter (HOSPITAL_BASED_OUTPATIENT_CLINIC_OR_DEPARTMENT_OTHER): Payer: 59 | Admitting: Physician Assistant

## 2023-04-15 DIAGNOSIS — L97423 Non-pressure chronic ulcer of left heel and midfoot with necrosis of muscle: Secondary | ICD-10-CM

## 2023-04-15 DIAGNOSIS — E10621 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: Secondary | ICD-10-CM | POA: Diagnosis not present

## 2023-04-15 LAB — GLUCOSE, CAPILLARY
Glucose-Capillary: 86 mg/dL (ref 70–99)
Glucose-Capillary: 93 mg/dL (ref 70–99)
Glucose-Capillary: 93 mg/dL (ref 70–99)
Glucose-Capillary: 135 mg/dL — ABNORMAL HIGH (ref 70–99)
Glucose-Capillary: 86 mg/dL (ref 70–99)
Glucose-Capillary: 97 mg/dL (ref 70–99)
Glucose-Capillary: 194 mg/dL — ABNORMAL HIGH (ref 70–99)

## 2023-04-15 NOTE — Progress Notes (Signed)
[name], [name] ([id number]) [id number] Page 1 of 2 Visit Report for 04/15/2023 Arrival Information Details Patient Name: Date of Service: [name], [name] 04/15/2023 7:30 A M Medical Record Number: [id number] Patient Account Number: [id number] Date of Birth/Sex: Treating RN: 05/19/1966 (57 y.o. [name], Bonita Quin Primary Care [name]: [name] Other Clinician: [name] Referring [name]: Treating [name]/Extender: [name] in Treatment: 15 Visit Information History Since Last Visit All ordered tests and consults were completed: Yes Patient Arrived: Ambulatory Added or deleted any medications: No Arrival Time: 08:03 Any new allergies or adverse reactions: No Accompanied By: self Had a fall or experienced change in No Transfer Assistance: None activities of daily living that may affect Patient Identification Verified: Yes risk of falls: Secondary Verification Process Completed: Yes Signs or symptoms of abuse/neglect since last visito No Patient Requires Transmission-Based Precautions: No Hospitalized since last visit: No Patient Has Alerts: No Implantable device outside of the clinic excluding No cellular tissue based products placed in the center since last visit: Pain Present Now: No Electronic Signature(s) Signed: 04/15/2023 1:31:54 PM By: [name] CHT EMT BS , , Entered By: [name] on 04/15/2023 13:31:54 -------------------------------------------------------------------------------- Encounter Discharge Information Details Patient Name: Date of Service: [name] Pont, [place] [name] J. 04/15/2023 7:30 A M Medical Record Number: [id number] Patient Account Number: [id number] Date of Birth/Sex: Treating RN: 1965/12/29 (57 y.o. [name] Primary Care Shaneal Barasch: [name] Other Clinician: [name] Referring Ichelle Harral: Treating [name]/Extender: [name] in Treatment: 15 Encounter Discharge Information Items Discharge Condition: Stable Ambulatory Status: Ambulatory Discharge Destination: Home Transportation: Private Auto Accompanied By: self Schedule Follow-up Appointment: No Clinical Summary of Care: Electronic Signature(s) Signed: 04/15/2023 3:10:21 PM By: [name] CHT EMT BS , , Entered By: [name] on 04/15/2023 15:10:21 [name] ([id number]) [id number] Page 2 of 2 -------------------------------------------------------------------------------- Vitals Details Patient Name: Date of Service: [name], [name] 04/15/2023 7:30 A M Medical Record Number: [id number] Patient Account Number: [id number] Date of Birth/Sex: Treating RN: 1965-12-27 (57 y.o. [name] Primary Care Zhi Geier: [name] Other Clinician: [name] Referring Anjolie Majer: Treating [name]/Extender: [name] in Treatment: 15 Vital Signs Time Taken: 07:46 Temperature (F): 97.8 Height (in): 68 Pulse (bpm): 70 Weight (lbs): 200 Respiratory Rate (breaths/min): 18 Body Mass Index (BMI): 30.4 Blood Pressure (mmHg): 133/67 Capillary Blood Glucose (mg/dl): 93 Reference Range: 80 - 120 mg / dl Electronic Signature(s) Signed: 04/15/2023 1:33:09 PM By: [name] CHT EMT BS , , Entered By: [name] on 04/15/2023 13:33:09

## 2023-04-15 NOTE — Progress Notes (Addendum)
[name], [name] ([id number]) [id number] Page 1 of 2 Visit Report for 04/15/2023 HBO Details Patient Name: Date of Service: [name], [name] 04/15/2023 7:30 A M Medical Record Number: [id number] Patient Account Number: [id number] Date of Birth/Sex: Treating RN: [name] (57 y.o. [name] Primary Care [name]: [name] Other Clinician: Haywood [name] Referring [name]: Treating [name]/Extender: [name] in Treatment: 15 HBO Treatment Course Details Treatment Course Number: 1 Ordering [name]: [name] T Treatments Ordered: otal 60 HBO Treatment Start Date: 02/16/2023 HBO Indication: Other (specify in Notes) Notes: Acute peripheral arterial insufficiency HBO Treatment Details Treatment Number: 32 Patient Type: Outpatient Chamber Type: Monoplace Chamber Serial #: [id number] Treatment Protocol: 2.0 ATA with 90 minutes oxygen, with two 5 minute air breaks Treatment Details Compression Rate Down: 1.5 psi / minute De-Compression Rate Up: 2.0 psi / minute A breaks and breathing ir Compress Tx Pressure periods Decompress Decompress Begins Reached (leave unused spaces Begins Ends blank) Chamber Pressure (ATA 1 2 2 2 2 2  --2 1 ) Clock Time (24 hr) 09:31 09:40 10:10 10:15 10:45 10:50 - - 11:20 11:30 Treatment Length: 119 (minutes) Treatment Segments: 4 Vital Signs Capillary Blood Glucose Reference Range: 80 - 120 mg / dl HBO Diabetic Blood Glucose Intervention Range: <131 mg/dl or >782 mg/dl Type: Time Vitals Blood Pulse: Respiratory Temperature: Capillary Blood Glucose Pulse Action Taken: Pressure: Rate: Glucose (mg/dl): Meter #: Oximetry (%) Taken: Pre 07:46 133/67 70 18 97.8 93 8 oz Glucerna, 4 oz orange juice given Pre 08:04 86 Measured early anticpating increase Pre 08:22 93 Pre 08:38 97 Pre 08:49 86 Pre 09:04 135 Post 11:35 128/68 67 18 97.2 194 none per protocol. Treatment  Response Treatment Toleration: Well Treatment Completion Status: Treatment Completed without Adverse Event Treatment Notes [name] arrived with normal vital signs except blood glucose at 93 mg/dL at 9562. He was given 8 oz Glucerna and 4 oz Orang juice at that time. Patient stated that he ate Sausage egg cheese biscuit, Protein Shake, and drank coffee around 0700. He stated that he self-administered 17 units Lantus. Based on patient's history and well management of his diabetes with Hyperbaric treatments in the past, opted to check glucose early with result of 86 mg/dL at 1308. 93 mg/dL at 6578, 97 mg/dL at 4696. Patient wanted to do his treatment, requesting another blood glucose reading. 0849 86 mg/dL. At 0904, patient's blood glucose rose to 135 mg/dL. [name] consulted. Patient cleared for treatment. After performing safety check, patient was placed in the chamber which was compressed at a rate of 2 psi/min after confirming normal ear equalization. He tolerated treatment well and decompression at 2 psi/min as well. He denied any issues with ear equalization and pain associated with barotrauma. His post-treatment vital signs were within normal range, blood glucose 194 mg/dL. He was stable upon discharge. Physician HBO Attestation: I certify that I supervised this HBO treatment in accordance with Medicare guidelines. A trained emergency response team is readily available per Yes hospital policies and procedures. Continue HBOT as ordered. [name], [name] ([id number]) [id number] Page 2 of 2 Electronic Signature(s) Signed: 04/15/2023 4:57:59 PM By: [name] Derry PA-C Previous Signature: 04/15/2023 3:08:34 PM Version By: [name] CHT EMT BS , , Previous Signature: 04/15/2023 1:51:58 PM Version By: [name] CHT EMT BS , , Previous Signature: 04/15/2023 1:51:10 PM Version By: [name] CHT EMT BS , , Entered By: [name] on 04/15/2023  16:57:59 -------------------------------------------------------------------------------- HBO Safety Checklist Details Patient Name: Date  of Service: [name], [name] 04/15/2023 7:30 A M Medical Record Number: [id number] Patient Account Number: [id number] Date of Birth/Sex: Treating RN: [name] (58 y.o. [name] Primary Care [name]: [name] Other Clinician: Haywood [name] Referring Sheritta Deeg: Treating Channel Papandrea/Extender: [name] in Treatment: 15 HBO Safety Checklist Items Safety Checklist Consent Form Signed Patient voided / foley secured and emptied When did you last eato 0700 Last dose of injectable or oral agent 0630 - 17 Units Lantus Ostomy pouch emptied and vented if applicable NA All implantable devices assessed, documented and approved Freestyle Libre 2 and3 on approved list Intravenous access site secured and place NA Valuables secured Linens and cotton and cotton/polyester blend (less than 51% polyester) Personal oil-based products / skin lotions / body lotions removed Wigs or hairpieces removed NA Smoking or tobacco materials removed NA Books / newspapers / magazines / loose paper removed Cologne, aftershave, perfume and deodorant removed Jewelry removed (may wrap wedding band) Make-up removed NA Hair care products removed Battery operated devices (external) removed Heating patches and chemical warmers removed Titanium eyewear removed Nail polish cured greater than 10 hours NA Casting material cured greater than 10 hours NA Hearing aids removed NA Loose dentures or partials removed NA Prosthetics have been removed NA Patient demonstrates correct use of air break device (if applicable) Patient concerns have been addressed Patient grounding bracelet on and cord attached to chamber Specifics for Inpatients (complete in addition to above) Medication sheet sent with patient NA Intravenous medications  needed or due during therapy sent with patient NA Drainage tubes (e.g. nasogastric tube or chest tube secured and vented) NA Endotracheal or Tracheotomy tube secured NA Cuff deflated of air and inflated with saline NA Airway suctioned NA Notes Paper version used prior to treatment start. Electronic Signature(s) Signed: 04/15/2023 1:34:26 PM By: [name] CHT EMT BS , , Entered By: [name] on 04/15/2023 13:34:26

## 2023-04-15 NOTE — Progress Notes (Signed)
Post-Op Visit Note   Patient: [name]           Date of Birth: June 26, 1966           MRN: [id number] Visit Date: 04/15/2023 PCP: Garlan Fillers, MD  Chief Complaint:  Chief Complaint  Patient presents with   Left Achilles Tendon - Routine Post Op    03/04/23 left achilles debridement       HPI:  HPI The patient is a 57 year old gentleman who is seen in follow-up status post left Achilles debridement on July 31 he has had Kerecis micro powder applied in the office, donated.  Was last seen 1 week ago.  Has been working on home exercise program range of motion foot and ankle Ortho Exam On examination of the left Achilles area of the ulcer is 100% filled in with granulation there is good uptake of the Kerecis powder.  There is no longer any underlying or folds within the wound bed.  No tunneling.  It is flat with granulation.  No erythema warmth or sign of infection  Visit Diagnoses: No diagnosis found.  Plan: Kerecis micro powder donation reapplied today.  Will change overlying gauze daily as instructed last week he will follow-up in the office in 1 more week for reevaluation  Follow-Up Instructions: No follow-ups on file.   Imaging: No results found.  Orders:  No orders of the defined types were placed in this encounter.  No orders of the defined types were placed in this encounter.    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 03/22/2020   Osteopenia of multiple sites 02/28/2020   PVD (peripheral vascular disease) (HCC) 10/11/2019   Chronic venous insufficiency 10/05/2018   Diabetic cataract (HCC) 05/21/2018   Controlled type 1 diabetes mellitus with diabetic peripheral angiopathy without gangrene (HCC) 08/14/2017    Dyslipidemia 03/26/2017   High risk medication use 09/23/2016   History of hypertension 09/23/2016   History of chronic kidney disease 09/23/2016   History of coronary artery disease 09/23/2016   Tobacco abuse 09/23/2016   Primary osteoarthritis of both knees 09/23/2016   History of diabetes mellitus 09/23/2016   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 (HCC) 11/20/2015   Chest pain with high risk for cardiac etiology 10/29/2015   Asymptomatic bilateral carotid artery stenosis 06/08/2014   Pleural effusion, bilateral 06/03/2014   Dyspnea 06/01/2014   Diabetes (HCC) 05/03/2014   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/03/2014   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   Chronic renal disease, stage 3, moderately decreased glomerular filtration rate between 30-59 mL/min/1.73 square meter (HCC) 05/03/2014   CAD (coronary artery disease) 04/27/2014   Acne 12/19/2013   On isotretinoin therapy 12/19/2013   Nuclear sclerotic cataract, bilateral 12/19/2011   Vitreous hemorrhage (HCC) 06/16/2011   Past Medical History:  Diagnosis Date   Anginal pain (HCC)    Anxiety    Arthritis    RA IN HANDS   Asthma    CAD (coronary artery disease) 04/27/2014   Chronic renal disease, stage 3, moderately decreased glomerular filtration rate between 30-59 mL/min/1.73 square meter (HCC) 05/03/2014   COPD (chronic obstructive pulmonary disease) (HCC)    Coronary artery  disease    Diabetes (HCC) 05/03/2014   Diabetes mellitus without complication (HCC)    type 1   Hyperlipidemia    Left shoulder pain    Peripheral vascular disease (HCC)    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/03/2014   Shortness of breath    Sleep apnea    mild OSA, no CPAP   Unstable angina pectoris (HCC) 04/25/2014    Family History  Problem Relation Age of Onset   Stomach cancer Mother    Cancer Mother     Prostate cancer Father    Heart disease Father    Cancer - Prostate Father    Heart disease Brother    Asthma Daughter    Healthy Daughter    Asthma Daughter     Past Surgical History:  Procedure Laterality Date   ABDOMINAL AORTOGRAM W/LOWER EXTREMITY N/A 01/15/2023   Procedure: ABDOMINAL AORTOGRAM W/LOWER EXTREMITY;  Surgeon: Cephus Shelling, MD;  Location: [facility] INVASIVE CV LAB;  Service: Cardiovascular;  Laterality: N/A;   CARDIAC CATHETERIZATION  04/25/2014   BY DR Jacinto Halim   CARDIAC CATHETERIZATION N/A 10/30/2015   Procedure: Left Heart Cath and Cors/Grafts Angiography;  Surgeon: Yates Decamp, MD;  Location: [facility] INVASIVE CV LAB;  Service: Cardiovascular;  Laterality: N/A;   CARPAL TUNNEL RELEASE     CATARACT EXTRACTION, BILATERAL     CORONARY ARTERY BYPASS GRAFT N/A 04/27/2014   Procedure: CORONARY ARTERY BYPASS GRAFTING on pump using left internal mammary artery to LAD coronary artery, right great saphenous vein graft to diagonal coronary artery with sequential to OM1 and circumflex coronary arteries. Right greater saphenous vein graft to posterior descending coronary artery. ;  Surgeon: Delight Ovens, MD;  Location: [facility] OR;  Service: Open Heart Surgery;  Laterality: N   elbow drained Left 05/09/2019   ENDOVEIN HARVEST OF GREATER SAPHENOUS VEIN Right 04/27/2014   Procedure: ENDOVEIN HARVEST OF GREATER SAPHENOUS VEIN;  Surgeon: Delight Ovens, MD;  Location: [facility] OR;  Service: Open Heart Surgery;  Laterality: Right;   EXCISION ORAL TUMOR N/A 03/01/2018   Procedure: EXCISION ORAL TUMOR;  Surgeon: Christia Reading, MD;  Location: [facility];  Service: ENT;  Laterality: N/A;   EYE SURGERY     LASER   EYE SURGERY Bilateral    astigmatism correction    HARDWARE REMOVAL Left 10/03/2020   Procedure: LEFT FOOT REMOVAL HARDWARE, PLACE VANC POWDER;  Surgeon: Nadara Mustard, MD;  Location: [facility] OR;  Service: Orthopedics;  Laterality: Left;   I & D EXTREMITY Left 03/04/2023   Procedure: LEFT  ACHILLES DEBRIDEMENT;  Surgeon: Nadara Mustard, MD;  Location: [facility] OR;  Service: Orthopedics;  Laterality: Left;   INTRAOPERATIVE TRANSESOPHAGEAL ECHOCARDIOGRAM N/A 04/27/2014   Procedure: INTRAOPERATIVE TRANSESOPHAGEAL ECHOCARDIOGRAM;  Surgeon: Delight Ovens, MD;  Location: [facility] OR;  Service: Open Heart Surgery;  Laterality: N/A;   IR RADIOLOGIST EVAL & MGMT  12/22/2022   LEFT HEART CATHETERIZATION WITH CORONARY ANGIOGRAM N/A 04/25/2014   Procedure: LEFT HEART CATHETERIZATION WITH CORONARY ANGIOGRAM;  Surgeon: Pamella Pert, MD;  Location: [facility] CATH LAB;  Service: Cardiovascular;  Laterality: N/A;   MOUTH SURGERY  11/15/2017   tongue surgery    ORIF TOE FRACTURE Left 03/22/2020   Procedure: OPEN REDUCTION INTERNAL FIXATION (ORIF) Non Union 5th Metatarsal;  Surgeon: Kathryne Hitch, MD;  Location: [facility];  Service: Orthopedics;  Laterality: Left;   Social History   Occupational History   Not on file  Tobacco Use  Smoking status: Former    Current packs/day: 0.25    Average packs/day: 0.3 packs/day for 34.0 years (8.5 ttl pk-yrs)    Types: Cigarettes   Smokeless tobacco: Never  Vaping Use   Vaping status: Never Used  Substance and Sexual Activity   Alcohol use: Not Currently    Comment: RARE   Drug use: No   Sexual activity: Not on file

## 2023-04-16 ENCOUNTER — Encounter (HOSPITAL_BASED_OUTPATIENT_CLINIC_OR_DEPARTMENT_OTHER): Payer: 59 | Admitting: Internal Medicine

## 2023-04-16 DIAGNOSIS — S91302A Unspecified open wound, left foot, initial encounter: Secondary | ICD-10-CM

## 2023-04-16 DIAGNOSIS — I70245 Atherosclerosis of native arteries of left leg with ulceration of other part of foot: Secondary | ICD-10-CM

## 2023-04-16 DIAGNOSIS — E10621 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: Secondary | ICD-10-CM | POA: Diagnosis not present

## 2023-04-16 DIAGNOSIS — J449 Chronic obstructive pulmonary disease, unspecified: Secondary | ICD-10-CM

## 2023-04-16 DIAGNOSIS — L97528 Non-pressure chronic ulcer of other part of left foot with other specified severity: Secondary | ICD-10-CM

## 2023-04-16 LAB — GLUCOSE, CAPILLARY
Glucose-Capillary: 111 mg/dL — ABNORMAL HIGH (ref 70–99)
Glucose-Capillary: 129 mg/dL — ABNORMAL HIGH (ref 70–99)
Glucose-Capillary: 130 mg/dL — ABNORMAL HIGH (ref 70–99)
Glucose-Capillary: 248 mg/dL — ABNORMAL HIGH (ref 70–99)

## 2023-04-16 NOTE — Progress Notes (Addendum)
[name] ([id number]) [id number] Page 1 of 2 Visit Report for 04/16/2023 HBO Details Patient Name: Date of Service: [name], [name] 04/16/2023 7:30 A M Medical Record Number: [id number] Patient Account Number: [id number] Date of Birth/Sex: Treating RN: Sep 26, 1965 (57 y.o. [name] Primary Care [name]: [name] Other Clinician: Haywood [name] Referring [name]: Treating [name]/Extender: [name] in Treatment: 16 HBO Treatment Course Details Treatment Course Number: 1 Ordering [name]: [name] T Treatments Ordered: otal 60 HBO Treatment Start Date: 02/16/2023 HBO Indication: Other (specify in Notes) Notes: Acute peripheral arterial insufficiency HBO Treatment Details Treatment Number: 33 Patient Type: Outpatient Chamber Type: Monoplace Chamber Serial #: [id number] Treatment Protocol: 2.0 ATA with 90 minutes oxygen, with two 5 minute air breaks Treatment Details Compression Rate Down: 1.5 psi / minute De-Compression Rate Up: 2.0 psi / minute A breaks and breathing ir Compress Tx Pressure periods Decompress Decompress Begins Reached (leave unused spaces Begins Ends blank) Chamber Pressure (ATA 1 2 2 2 2 2  --2 1 ) Clock Time (24 hr) 08:26 08:39 09:09 09:14 09:44 09:49 - - 10:19 10:29 Treatment Length: 123 (minutes) Treatment Segments: 4 Vital Signs Capillary Blood Glucose Reference Range: 80 - 120 mg / dl HBO Diabetic Blood Glucose Intervention Range: <131 mg/dl or >782 mg/dl Time Capillary Blood Glucose Pulse Blood Respiratory Action Type: Vitals Pulse: Temperature: Glucose Oximetry Pressure: Rate: Meter #: Taken: Taken: (mg/dl): (%) Pre 95:62 130/86 75 20 98.2 129 none per protocol Given 8 oz Glucerna and 2x 4oz Juice per Dr. Lisette Grinder 08:14 111 [place] Post 10:32 134/52 72 18 98 248 asymptomatic for hypotension Treatment Response Treatment Toleration: Well Treatment  Completion Status: Treatment Completed without Adverse Event Treatment Notes [name] arrived with normal vital signs except blood glucose level of 129 mg/dL. Patient described his breakfast and blood glucose level was re-checked after 20 minutes. At that time the result was 111 mg/dL. Patient was given 8 oz Glucerna and 2 x 4 oz Orange juice per orders/protocol. Blood glucose was re- checked at 0826 with result of 130 mg/dL. After performing a safety check, patient was placed in the chamber which was compressed at a rate of 2 psi/min after confirming normal ear equalization. He tolerated the treatment and subsequent decompression of the chamber at a rate of 2 psi/min. Post-treatment vital signs were normal except diastolic BP of 52 mmHg. He denied symptoms related to hypotension. He was stable upon discharge to wound care center for wound care encounter. Physician HBO Attestation: I certify that I supervised this HBO treatment in accordance with Medicare guidelines. A trained emergency response team is readily available per Yes hospital policies and procedures. Continue HBOT as ordered. Yes Electronic Signature(s) Signed: 04/17/2023 12:33:51 PM By: [name] Corwin DO Previous Signature: 04/16/2023 11:33:28 AM Version By: [name] CHT EMT BS , , Previous Signature: 04/16/2023 3:50:21 PM Version By: [name] ([id number]) [id number] Page 2 of 2 Previous Signature: 04/16/2023 3:50:21 PM Version By: [name] Corwin DO Previous Signature: 04/16/2023 11:24:25 AM Version By: [name] CHT EMT BS , , Entered By: [name] on 04/17/2023 09:37:22 -------------------------------------------------------------------------------- HBO Safety Checklist Details Patient Name: Date of Service: [name] Pont, [name] 04/16/2023 7:30 A M Medical Record Number: [id number] Patient Account Number: [id number] Date of Birth/Sex: Treating  RN: 05-Apr-1966 (57 y.o. [name] Primary Care [name]: [name] Other Clinician: Haywood [name] Referring [name]: Treating [name]/Extender: [name] in Treatment: 16 HBO Safety Checklist Items  Safety Checklist Consent Form Signed Patient voided / foley secured and emptied 0730 - SEC biscuit, OJ, Protein Shake, When did you last eato various other items Last dose of injectable or oral agent 0630 - 17 units Lantus Ostomy pouch emptied and vented if applicable NA All implantable devices assessed, documented and approved Freestyle Libre 2 and3 on approved list Intravenous access site secured and place NA Valuables secured Linens and cotton and cotton/polyester blend (less than 51% polyester) Personal oil-based products / skin lotions / body lotions removed Wigs or hairpieces removed NA Smoking or tobacco materials removed NA Books / newspapers / magazines / loose paper removed Cologne, aftershave, perfume and deodorant removed Jewelry removed (may wrap wedding band) Make-up removed NA Hair care products removed Libre Freestyle 2 and 3 Sensor Approved, other Battery operated devices (external) removed electronics removed. Heating patches and chemical warmers removed Titanium eyewear removed Nail polish cured greater than 10 hours NA Casting material cured greater than 10 hours NA Hearing aids removed NA Loose dentures or partials removed NA Prosthetics have been removed NA Patient demonstrates correct use of air break device (if applicable) Patient concerns have been addressed Patient grounding bracelet on and cord attached to chamber Specifics for Inpatients (complete in addition to above) Medication sheet sent with patient NA Intravenous medications needed or due during therapy sent with patient NA Drainage tubes (e.g. nasogastric tube or chest tube secured and vented) NA Endotracheal or Tracheotomy tube  secured NA Cuff deflated of air and inflated with saline NA Airway suctioned NA Notes Paper version used prior to treatment start. Electronic Signature(s) Signed: 04/16/2023 11:10:08 AM By: [name] CHT EMT BS , , Entered By: [name] on 04/16/2023 11:10:08

## 2023-04-16 NOTE — Progress Notes (Signed)
JACHOB, HAYRE ([id number]) [id number] Page 1 of 2 Visit Report for 04/15/2023 Problem List Details Patient Name: Date of Service: [name], [name] 04/15/2023 7:30 A M Medical Record Number: [id number] Patient Account Number: [id number] Date of Birth/Sex: Treating RN: 27-Apr-1966 (57 y.o. 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 04/15/2023 Medical Record Number: [id number] Patient Account Number: [id number] [name], [name] ([id number]) [id number] Page 2 of 2 Date of Birth/Sex: Treating RN: Feb 11, 1966 (57 y.o. 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 J44.9 Chronic obstructive pulmonary disease, unspecified M06.9 Rheumatoid arthritis, unspecified Z87.891 Personal history of nicotine dependence Facility Procedures : CPT4 Code Description: [id number] G0277-(Facility Use Only) HBOT full body chamber, , ICD-10 Diagnosis Description I70.245 Atherosclerosis of native arteries of left leg with ulceration of L97.528 Non-pressure chronic ulcer of other part of left foot  with other S91.302A Unspecified open wound, left foot, initial encounter 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 Modifier: other part of f specified severi Quantity: 4 oot ty Physician Procedures : CPT4 Code Description Modifier [id number] - WC PHYS HYPERBARIC OXYGEN THERAPY ICD-10 Diagnosis Description I70.245 Atherosclerosis of native arteries of left leg with ulceration of other part of fo L97.528 Non-pressure chronic ulcer of other part of  left foot with other specified severit S91.302A Unspecified open wound, left foot, initial encounter 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 Quantity: 1 ot y Psychologist, [facility]) Signed: 04/15/2023 3:08:54 PM By: [name] CHT EMT BS , , Signed: 04/16/2023 5:07:09 PM By: Allen Derry PA-C Entered By: [name] on 04/15/2023 12:08:53

## 2023-04-16 NOTE — Progress Notes (Addendum)
BAUER, MALINSKY ([id number]) [id number] Page 1 of 1 Visit Report for 04/16/2023 Encounter Discharge Information Details Patient Name: Date of Service: [place], [name] 04/16/2023 7:30 A M Medical Record Number: [id number] Patient Account Number: [id number] Date of Birth/Sex: Treating RN: October 13, 1965 (57 y.o. [name] Primary Care Kennith Morss: [name] Other Clinician: [name] Referring Juanita Devincent: Treating Estle Huguley/Extender: Donnetta Hail in Treatment: 16 Encounter Discharge Information Items Discharge Condition: Stable Ambulatory Status: Ambulatory Discharge Destination: Other (Note Required) Transportation: Other Accompanied By: staff Schedule Follow-up Appointment: No Clinical Summary of Care: Notes Wound Care Visit after treatment. Electronic Signature(s) Signed: 04/16/2023 1:00:30 PM By: [name] CHT EMT BS , , Entered By: [name] on 04/16/2023 13:00:30 -------------------------------------------------------------------------------- Vitals Details Patient Name: Date of Service: [name], [name] 04/16/2023 7:30 A M Medical Record Number: [id number] Patient Account Number: [id number] Date of Birth/Sex: Treating RN: 04-18-1966 (57 y.o. [name] Primary Care [place] Zaragoza: [name] Other Clinician: [name] Referring Danie Diehl: Treating Beckett Hickmon/Extender: Donnetta Hail in Treatment: 16 Vital Signs Time Taken: 07:57 Temperature (F): 98.2 Height (in): 68 Pulse (bpm): 75 Weight (lbs): 200 Respiratory Rate (breaths/min): 20 Body Mass Index (BMI): 30.4 Blood Pressure (mmHg): 136/75 Capillary Blood Glucose (mg/dl): 086 Reference Range: 80 - 120 mg / dl Electronic Signature(s) Signed: 04/16/2023 11:08:09 AM By: [name] CHT EMT BS , , Entered By: [name] on 04/16/2023 11:08:09

## 2023-04-16 NOTE — Progress Notes (Signed)
[name], [name] ([id number]) [id number] Page 1 of 1 Visit Report for 04/16/2023 SuperBill Details Patient Name: Date of Service: [place], [name] 04/16/2023 Medical Record Number: [id number] Patient Account Number: [id number] Date of Birth/Sex: Treating RN: 1965-12-30 (57 y.o. 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 J44.9 Chronic obstructive pulmonary disease, unspecified M06.9 Rheumatoid arthritis, unspecified Z87.891 Personal history of nicotine dependence Facility Procedures CPT4 Code Description Modifier Quantity [id number] G0277-(Facility Use Only) HBOT full body chamber, , 4 ICD-10 Diagnosis Description 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 S91.302A Unspecified open wound, left foot, initial encounter 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 Physician Procedures Quantity CPT4 Code Description Modifier [id number] 99183 - WC PHYS HYPERBARIC OXYGEN THERAPY 1 ICD-10 Diagnosis Description 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

## 2023-04-16 NOTE — Progress Notes (Addendum)
SHADOE, HOLOHAN ([id number]) [id number] Page 1 of 7 Visit Report for 04/16/2023 Arrival Information Details Patient Name: Date of Service: [name], [name] 04/16/2023 10:15 A M Medical Record Number: [id number] Patient Account Number: [id number] Date of Birth/Sex: Treating RN: 1966/01/05 (57 y.o. M) Primary Care Ousmane Seeman: [name] Other Clinician: Referring Blanca Thornton: Treating Milika Ventress/Extender: Donnetta Hail in Treatment: 16 Visit Information History Since Last Visit Added or deleted any medications: No Patient Arrived: Ambulatory Any new allergies or adverse reactions: No Arrival Time: 10:51 Had a fall or experienced change in No Accompanied By: self activities of daily living that may affect Transfer Assistance: None risk of falls: Patient Identification Verified: Yes Signs or symptoms of abuse/neglect since last visito No Secondary Verification Process Completed: Yes Hospitalized since last visit: No Patient Requires Transmission-Based Precautions: No Implantable device outside of the clinic excluding Yes Patient Has Alerts: No cellular tissue based products placed in the center since last visit: Has Dressing in Place as Prescribed: Yes Pain Present Now: No Electronic Signature(s) Signed: 04/17/2023 11:49:10 AM By: Thayer Dallas Entered By: Thayer Dallas on 04/16/2023 11:00:09 -------------------------------------------------------------------------------- Clinic Level of Care Assessment Details Patient Name: Date of Service: [name], [name] 04/16/2023 10:15 A M Medical Record Number: [id number] Patient Account Number: [id number] Date of Birth/Sex: Treating RN: 1966-04-18 (57 y.o. [name] Primary Care Vickey Ewbank: [name] Other Clinician: Referring Romero Letizia: Treating Bentli Llorente/Extender: Donnetta Hail in Treatment: 16 Clinic Level of Care Assessment Items TOOL 4  Quantity Score X- 1 0 Use when only an EandM is performed on FOLLOW-UP visit ASSESSMENTS - Nursing Assessment / Reassessment X- 1 10 Reassessment of Co-morbidities (includes updates in patient status) X- 1 5 Reassessment of Adherence to Treatment Plan ASSESSMENTS - Wound and Skin A ssessment / Reassessment []  - 0 Simple Wound Assessment / Reassessment - one wound []  - 0 Complex Wound Assessment / Reassessment - multiple wounds []  - 0 Dermatologic / Skin Assessment (not related to wound area) ASSESSMENTS - Focused Assessment []  - 0 Circumferential Edema Measurements - multi extremities []  - 0 Nutritional Assessment / Counseling / Intervention [name], [name] ([id number]) [id number] Page 2 of 7 []  - 0 Lower Extremity Assessment (monofilament, tuning fork, pulses) []  - 0 Peripheral Arterial Disease Assessment (using hand held doppler) ASSESSMENTS - Ostomy and/or Continence Assessment and Care []  - 0 Incontinence Assessment and Management []  - 0 Ostomy Care Assessment and Management (repouching, etc.) PROCESS - Coordination of Care X - Simple Patient / Family Education for ongoing care 1 15 []  - 0 Complex (extensive) Patient / Family Education for ongoing care X- 1 10 Staff obtains Chiropractor, Records, T Results / Process Orders est X- 1 10 Staff telephones HHA, Nursing Homes / Clarify orders / etc []  - 0 Routine Transfer to another Facility (non-emergent condition) []  - 0 Routine Hospital Admission (non-emergent condition) []  - 0 New Admissions / Manufacturing engineer / Ordering NPWT Apligraf, etc. , []  - 0 Emergency Hospital Admission (emergent condition) X- 1 10 Simple Discharge Coordination []  - 0 Complex (extensive) Discharge Coordination PROCESS - Special Needs []  - 0 Pediatric / Minor Patient Management []  - 0 Isolation Patient Management []  - 0 Hearing / Language / Visual special needs []  - 0 Assessment of Community  assistance (transportation, D/C planning, etc.) []  - 0 Additional assistance / Altered mentation []  - 0 Support Surface(s) Assessment (bed, cushion, seat, etc.) INTERVENTIONS - Wound Cleansing / Measurement []  - 0 Simple Wound Cleansing - one  wound []  - 0 Complex Wound Cleansing - multiple wounds []  - 0 Wound Imaging (photographs - any number of wounds) []  - 0 Wound Tracing (instead of photographs) []  - 0 Simple Wound Measurement - one wound []  - 0 Complex Wound Measurement - multiple wounds INTERVENTIONS - Wound Dressings []  - 0 Small Wound Dressing one or multiple wounds []  - 0 Medium Wound Dressing one or multiple wounds []  - 0 Large Wound Dressing one or multiple wounds []  - 0 Application of Medications - topical []  - 0 Application of Medications - injection INTERVENTIONS - Miscellaneous []  - 0 External ear exam []  - 0 Specimen Collection (cultures, biopsies, blood, body fluids, etc.) []  - 0 Specimen(s) / Culture(s) sent or taken to Lab for analysis []  - 0 Patient Transfer (multiple staff / Nurse, adult / Similar devices) []  - 0 Simple Staple / Suture removal (25 or less) []  - 0 Complex Staple / Suture removal (26 or more) []  - 0 Hypo / Hyperglycemic Management (close monitor of Blood Glucose) [name], [name] ([id number]) [id number] Page 3 of 7 []  - 0 Ankle / Brachial Index (ABI) - do not check if billed separately X- 1 5 Vital Signs Has the patient been seen at the hospital within the last three years: Yes Total Score: 65 Level Of Care: New/Established - Level 2 Electronic Signature(s) Signed: 04/16/2023 12:58:59 PM By: Karie Schwalbe RN Entered By: Karie Schwalbe on 04/16/2023 11:32:07 -------------------------------------------------------------------------------- Encounter Discharge Information Details Patient Name: Date of Service: [name] Pont, [name] [name] 04/16/2023 10:15 A M Medical Record Number: [id number] Patient Account  Number: [id number] Date of Birth/Sex: Treating RN: 1966/03/18 (57 y.o. [name] Primary Care Sherine Cortese: [name] Other Clinician: Referring Kijuana Ruppel: Treating Antony Sian/Extender: Donnetta Hail in Treatment: 16 Encounter Discharge Information Items Discharge Condition: Stable Ambulatory Status: Ambulatory Discharge Destination: Home Transportation: Private Auto Accompanied By: self Schedule Follow-up Appointment: Yes Clinical Summary of Care: Patient Declined Electronic Signature(s) Signed: 04/16/2023 12:58:59 PM By: Karie Schwalbe RN Entered By: Karie Schwalbe on 04/16/2023 11:35:00 -------------------------------------------------------------------------------- Lower Extremity Assessment Details Patient Name: Date of Service: [place], [name] 04/16/2023 10:15 A M Medical Record Number: [id number] Patient Account Number: [id number] Date of Birth/Sex: Treating RN: 26-Sep-1965 (57 y.o. M) Primary Care Emmah Bratcher: [name] Other Clinician: Referring Deundra Furber: Treating Clint Strupp/Extender: Donnetta Hail in Treatment: 16 Edema Assessment Assessed: [name]: No] Franne Forts: No] Edema: [Left: N] [Right: o] Calf Left: Right: Point of Measurement: 32 cm From Medial Instep 35.5 cm Ankle Left: Right: Point of Measurement: 13 cm From Medial Instep 22 cm Vascular Assessment [name], [name] ([id number]) [Right:[id number] Page 4 of 7] Extremity colors, hair growth, and conditions: Extremity Color: [Left:Normal] Hair Growth on Extremity: [Left:Yes] Temperature of Extremity: [Left:Warm] Capillary Refill: [Left:< 3 seconds] Dependent Rubor: [Left:No No] Electronic Signature(s) Signed: 04/17/2023 11:49:10 AM By: Thayer Dallas Entered By: Thayer Dallas on 04/16/2023 11:01:16 -------------------------------------------------------------------------------- Multi Wound Chart Details Patient Name:  Date of Service: [name] Pont, [name] [name] 04/16/2023 10:15 A M Medical Record Number: [id number] Patient Account Number: [id number] Date of Birth/Sex: Treating RN: [birth year] (57 y.o. M) Primary Care Farris Geiman: [name] Other Clinician: Referring Nyoka Alcoser: Treating Wenceslao Loper/Extender: Donnetta Hail in Treatment: 16 Vital Signs Height(in): 68 Capillary Blood Glucose(mg/dl): 433 Weight(lbs): 295 Pulse(bpm): 72 Body Mass Index(BMI): 30.4 Blood Pressure(mmHg): 134/52 Temperature(F): 98.0 Respiratory Rate(breaths/min): 18 [Treatment Notes:Wound #1 ([place]) Wound Laterality: Left Cleanser Peri-Wound Care Topical Primary Dressing Secondary Dressing Secured With Compression Wrap Compression Stockings Add-Ons] Electronic Signature(s) Signed:  04/16/2023 3:50:21 PM By: Geralyn Corwin DO Entered By: Geralyn Corwin on 04/16/2023 13:11:28 -------------------------------------------------------------------------------- Multi-Disciplinary Care Plan Details Patient Name: Date of Service: [name], [name] [name] 04/16/2023 10:15 A M Medical Record Number: [id number] Patient Account Number: [id number] [name], [name] ([id number]) [id number] Page 5 of 7 Date of Birth/Sex: Treating RN: 1966-06-29 (57 y.o. [name] Primary Care Ladarian Bonczek: Other Clinician: [name] Referring Osmond Steckman: Treating Rhythm Wigfall/Extender: Donnetta Hail in Treatment: 16 Active Inactive HBO Nursing Diagnoses: Potential for barotraumas to ears, sinuses, teeth, and lungs or cerebral gas embolism related to changes in atmospheric pressure inside hyperbaric oxygen chamber Potential for oxygen toxicity seizures related to delivery of 100% oxygen at an increased atmospheric pressure Potential for pulmonary oxygen toxicity related to delivery of 100% oxygen at an increased atmospheric pressure Goals: Patient and/or family will be able to  state/discuss factors appropriate to the management of their disease process during treatment Date Initiated: 02/16/2023 Target Resolution Date: 07/14/2023 Goal Status: Active Patient will tolerate the hyperbaric oxygen therapy treatment Date Initiated: 02/16/2023 Target Resolution Date: 04/14/2023 Goal Status: Active Patient/caregiver will verbalize understanding of HBO goals, rationale, procedures and potential hazards Date Initiated: 02/16/2023 Target Resolution Date: 04/14/2023 Goal Status: Active Interventions: Administer the correct therapeutic gas delivery based on the patients needs and limitations, per physician order Assess and provide for patients comfort related to the hyperbaric environment and equalization of middle ear Assess for signs and symptoms related to adverse events, including but not limited to confinement anxiety, pneumothorax, oxygen toxicity and baurotrauma Assess patient for any history of confinement anxiety Assess patient's knowledge and expectations regarding hyperbaric medicine and provide education related to the hyperbaric environment, goals of treatment and prevention of adverse events Implement protocols to decrease risk of pneumothorax in high risk patients Notes: Wound/Skin Impairment Nursing Diagnoses: Impaired tissue integrity Knowledge deficit related to smoking impact on wound healing Knowledge deficit related to ulceration/compromised skin integrity Goals: Patient will have a decrease in wound volume by X% from date: (specify in notes) Date Initiated: 12/25/2022 Target Resolution Date: 07/14/2023 Goal Status: Active Patient/caregiver will verbalize understanding of skin care regimen Date Initiated: 12/25/2022 Target Resolution Date: 07/14/2023 Goal Status: Active Ulcer/skin breakdown will have a volume reduction of 30% by week 4 Date Initiated: 12/25/2022 Date Inactivated: 01/27/2023 Target Resolution Date: 01/22/2023 Unmet Reason: no major  changes; Goal Status: Unmet continue current treatment. Interventions: Assess patient/caregiver ability to obtain necessary supplies Assess patient/caregiver ability to perform ulcer/skin care regimen upon admission and as needed Assess ulceration(s) every visit Provide education on smoking Provide education on ulcer and skin care Notes: Electronic Signature(s) Signed: 04/16/2023 12:58:59 PM By: Karie Schwalbe RN Entered By: Karie Schwalbe on 04/16/2023 11:27:20 [name] ([id number]) [id number] Page 6 of 7 -------------------------------------------------------------------------------- Pain Assessment Details Patient Name: Date of Service: [name], [name] 04/16/2023 10:15 A M Medical Record Number: [id number] Patient Account Number: [id number] Date of Birth/Sex: Treating RN: 1965-10-31 (57 y.o. M) Primary Care Makailey Hodgkin: [name] Other Clinician: Referring Beatryce Colombo: Treating Bonney Berres/Extender: Donnetta Hail in Treatment: 16 Active Problems Location of Pain Severity and Description of Pain Patient Has Paino No Site Locations Pain Management and Medication Current Pain Management: Electronic Signature(s) Signed: 04/17/2023 11:49:10 AM By: Thayer Dallas Entered By: Thayer Dallas on 04/16/2023 11:00:31 -------------------------------------------------------------------------------- Patient/Caregiver Education Details Patient Name: Date of Service: [name] Pont, [name] [name] 9/12/2024andnbsp10:15 A M Medical Record Number: [id number] Patient Account Number: [id number] Date of Birth/Gender: Treating RN: 10-Apr-1966 (57 y.o. [name] Primary  Care Physician: [name] Other Clinician: Referring Physician: Treating Physician/Extender: Donnetta Hail in Treatment: 16 Education Assessment Education Provided To: Patient Education Topics Provided Hyperbaric  Oxygenation: Handouts: Hyperbaric Oxygen Methods: Printed Responses: State content correctly [name], [name] ([id number]) [id number] Page 7 of 7 Electronic Signature(s) Signed: 04/16/2023 12:58:59 PM By: Karie Schwalbe RN Entered By: Karie Schwalbe on 04/16/2023 11:30:09 -------------------------------------------------------------------------------- Vitals Details Patient Name: Date of Service: [name] Pont, [name] [name] 04/16/2023 10:15 A M Medical Record Number: [id number] Patient Account Number: [id number] Date of Birth/Sex: Treating RN: 12-23-1965 (57 y.o. M) Primary Care Chalmer Zheng: [name] Other Clinician: [name] Referring Lalonnie Shaffer: Treating Shaquille Murdy/Extender: Donnetta Hail in Treatment: 16 Vital Signs Time Taken: 10:32 Temperature (F): 98.0 Height (in): 68 Pulse (bpm): 72 Weight (lbs): 200 Respiratory Rate (breaths/min): 18 Body Mass Index (BMI): 30.4 Blood Pressure (mmHg): 134/52 Capillary Blood Glucose (mg/dl): 500 Reference Range: 80 - 120 mg / dl Electronic Signature(s) Signed: 04/16/2023 10:38:28 AM By: [name] CHT EMT BS , , Entered By: [name] on 04/16/2023 10:38:28

## 2023-04-17 ENCOUNTER — Encounter (HOSPITAL_BASED_OUTPATIENT_CLINIC_OR_DEPARTMENT_OTHER): Payer: 59 | Admitting: Internal Medicine

## 2023-04-17 DIAGNOSIS — L97528 Non-pressure chronic ulcer of other part of left foot with other specified severity: Secondary | ICD-10-CM | POA: Diagnosis not present

## 2023-04-17 DIAGNOSIS — E10621 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: Secondary | ICD-10-CM

## 2023-04-17 DIAGNOSIS — I70245 Atherosclerosis of native arteries of left leg with ulceration of other part of foot: Secondary | ICD-10-CM

## 2023-04-17 LAB — GLUCOSE, CAPILLARY
Glucose-Capillary: 150 mg/dL — ABNORMAL HIGH (ref 70–99)
Glucose-Capillary: 217 mg/dL — ABNORMAL HIGH (ref 70–99)

## 2023-04-17 NOTE — Progress Notes (Addendum)
OTHAL, RACKOW ([id number]) [id number] Page 1 of 2 Visit Report for 04/17/2023 Arrival Information Details Patient Name: Date of Service: [place], [name] 04/17/2023 7:30 A M Medical Record Number: [id number] Patient Account Number: [id number] Date of Birth/Sex: Treating RN: 1966/01/17 (57 y.o. [name] Primary Care Loreley Schwall: [name] Other Clinician: [name] Referring Treg Diemer: Treating Acquanetta Cabanilla/Extender: Donnetta Hail in Treatment: 16 Visit Information History Since Last Visit All ordered tests and consults were completed: Yes Patient Arrived: Ambulatory Added or deleted any medications: No Arrival Time: 07:35 Any new allergies or adverse reactions: No Accompanied By: self Had a fall or experienced change in No Transfer Assistance: None activities of daily living that may affect Patient Identification Verified: Yes risk of falls: Secondary Verification Process Completed: Yes Signs or symptoms of abuse/neglect since last visito No Patient Requires Transmission-Based Precautions: No Hospitalized since last visit: No Patient Has Alerts: No Implantable device outside of the clinic excluding No cellular tissue based products placed in the center since last visit: Pain Present Now: No Electronic Signature(s) Signed: 04/17/2023 9:43:12 AM By: [name] CHT EMT BS , , Entered By: [name] on 04/17/2023 09:43:11 -------------------------------------------------------------------------------- Encounter Discharge Information Details Patient Name: Date of Service: [name], [name] 04/17/2023 7:30 A M Medical Record Number: [id number] Patient Account Number: [id number] Date of Birth/Sex: Treating RN: 18-Nov-1965 (57 y.o. [name] Primary Care Remi Lopata: [name] Other Clinician: [name] Referring Kenyona Rena: Treating Denise Bramblett/Extender: Donnetta Hail in Treatment: 16 Encounter Discharge Information Items Discharge Condition: Stable Ambulatory Status: Ambulatory Discharge Destination: Home Transportation: Private Auto Accompanied By: self Schedule Follow-up Appointment: No Clinical Summary of Care: Electronic Signature(s) Signed: 04/17/2023 12:29:07 PM By: [name] CHT EMT BS , , Entered By: [name] on 04/17/2023 12:29:07 [name] ([id number]) [id number] Page 2 of 2 -------------------------------------------------------------------------------- Vitals Details Patient Name: Date of Service: [name], [name] 04/17/2023 7:30 A M Medical Record Number: [id number] Patient Account Number: [id number] Date of Birth/Sex: Treating RN: 26-Apr-1966 (57 y.o. [name] Primary Care Kei Mcelhiney: [name] Other Clinician: [name] Referring Cordarrell Sane: Treating Itzael Liptak/Extender: Donnetta Hail in Treatment: 16 Vital Signs Time Taken: 07:45 Temperature (F): 97.9 Height (in): 68 Pulse (bpm): 73 Weight (lbs): 200 Respiratory Rate (breaths/min): 18 Body Mass Index (BMI): 30.4 Blood Pressure (mmHg): 138/58 Capillary Blood Glucose (mg/dl): 433 Reference Range: 80 - 120 mg / dl Electronic Signature(s) Signed: 04/17/2023 9:43:35 AM By: [name] CHT EMT BS , , Entered By: [name] on 04/17/2023 09:43:35

## 2023-04-17 NOTE — Progress Notes (Signed)
TOBIAH, HECKAMAN ([id number]) [id number] Page 1 of 1 Visit Report for 04/17/2023 SuperBill Details Patient Name: Date of Service: [place], [name] 04/17/2023 Medical Record Number: [id number] Patient Account Number: [id number] Date of Birth/Sex: Treating RN: 01/13/1966 (57 y.o. 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 J44.9 Chronic obstructive pulmonary disease, unspecified M06.9 Rheumatoid arthritis, unspecified Z87.891 Personal history of nicotine dependence Facility Procedures CPT4 Code Description Modifier Quantity [id number] G0277-(Facility Use Only) HBOT full body chamber, , 4 ICD-10 Diagnosis Description 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

## 2023-04-17 NOTE — Progress Notes (Addendum)
BRAXSON, BRANDSMA ([id number]) [id number] Page 1 of 2 Visit Report for 04/17/2023 HBO Details Patient Name: Date of Service: [place], [name] 04/17/2023 7:30 A M Medical Record Number: [id number] Patient Account Number: [id number] Date of Birth/Sex: Treating RN: 06-27-1966 (57 y.o. [name] Primary Care Sanjuan Sawa: [name] Other Clinician: [name] Referring Deiona Hooper: Treating Demetre Monaco/Extender: Donnetta Hail in Treatment: 16 HBO Treatment Course Details Treatment Course Number: 1 Ordering Euel Castile: Geralyn Corwin T Treatments Ordered: otal 60 HBO Treatment Start Date: 02/16/2023 HBO Indication: Other (specify in Notes) Notes: Acute peripheral arterial insufficiency HBO Treatment Details Treatment Number: 34 Patient Type: Outpatient Chamber Type: Monoplace Chamber Serial #: [id number] Treatment Protocol: 2.0 ATA with 90 minutes oxygen, with two 5 minute air breaks Treatment Details Compression Rate Down: 1.5 psi / minute De-Compression Rate Up: 2.0 psi / minute A breaks and breathing ir Compress Tx Pressure periods Decompress Decompress Begins Reached (leave unused spaces Begins Ends blank) Chamber Pressure (ATA 1 2 2 2 2 2  --2 1 ) Clock Time (24 hr) 08:08 08:18 08:48 08:53 09:23 09:28 - - 09:58 10:08 Treatment Length: 120 (minutes) Treatment Segments: 4 Vital Signs Capillary Blood Glucose Reference Range: 80 - 120 mg / dl HBO Diabetic Blood Glucose Intervention Range: <131 mg/dl or >841 mg/dl Type: Time Vitals Blood Pulse: Respiratory Temperature: Capillary Blood Glucose Pulse Action Taken: Pressure: Rate: Glucose (mg/dl): Meter #: Oximetry (%) Taken: Pre 07:45 138/58 73 18 97.9 150 asymptomatic for hypotension Post 10:11 135/59 68 18 97.7 217 asymptomatic for hypotension. Treatment Response Treatment Toleration: Well Treatment Completion Status: Treatment Completed without Adverse  Event Treatment Notes Mr. Ramsdell arrived with normal vital signs. Patient described his breakfast eaten at 0730. After performing a safety check, patient was placed in the chamber which was compressed at a rate of 2 psi/min after confirming normal ear equalization. He tolerated treatment and the decompression phase that followed at 2 psi/min. His post-treatment vital signs were within normal range. He denied issues with ear equalization and pain associated with barotrauma. He was stable upon discharge. Electronic Signature(s) Signed: 04/17/2023 12:28:05 PM By: [name] CHT EMT BS , , Signed: 04/17/2023 12:33:51 PM By: Geralyn Corwin DO Previous Signature: 04/17/2023 12:27:39 PM Version By: [name] CHT EMT BS , , Previous Signature: 04/17/2023 9:47:35 AM Version By: [name] CHT EMT BS , , Entered By: [name] on 04/17/2023 12:28:05 [name] ([id number]) [id number] Page 2 of 2 -------------------------------------------------------------------------------- HBO Safety Checklist Details Patient Name: Date of Service: [place], [name] 04/17/2023 7:30 A M Medical Record Number: [id number] Patient Account Number: [id number] Date of Birth/Sex: Treating RN: Aug 30, 1965 (57 y.o. [name] Primary Care Paislynn Hegstrom: [name] Other Clinician: [name] Referring Jerron Niblack: Treating Shizuo Biskup/Extender: Donnetta Hail in Treatment: 16 HBO Safety Checklist Items Safety Checklist Consent Form Signed Patient voided / foley secured and emptied When did you last eato 0700 - Breakfast Last dose of injectable or oral agent 0630 - 17 units Lantus Ostomy pouch emptied and vented if applicable NA All implantable devices assessed, documented and approved Freestyle Libre 2 and3 on approved list Intravenous access site secured and place NA Valuables secured Linens and cotton and  cotton/polyester blend (less than 51% polyester) Personal oil-based products / skin lotions / body lotions removed Wigs or hairpieces removed NA Smoking or tobacco materials removed NA Books / newspapers / magazines / loose paper removed Cologne, aftershave, perfume and deodorant removed Jewelry removed (may wrap wedding band)  Make-up removed NA Hair care products removed Battery operated devices (external) removed Heating patches and chemical warmers removed Titanium eyewear removed Nail polish cured greater than 10 hours NA Casting material cured greater than 10 hours NA Hearing aids removed NA Loose dentures or partials removed NA Prosthetics have been removed NA Patient demonstrates correct use of air break device (if applicable) Patient concerns have been addressed Patient grounding bracelet on and cord attached to chamber Specifics for Inpatients (complete in addition to above) Medication sheet sent with patient NA Intravenous medications needed or due during therapy sent with patient NA Drainage tubes (e.g. nasogastric tube or chest tube secured and vented) NA Endotracheal or Tracheotomy tube secured NA Cuff deflated of air and inflated with saline NA Airway suctioned NA Notes Paper version used prior to treatment start. Electronic Signature(s) Signed: 04/17/2023 9:45:03 AM By: [name] CHT EMT BS , , Entered By: [name] on 04/17/2023 09:45:01

## 2023-04-20 ENCOUNTER — Encounter (HOSPITAL_BASED_OUTPATIENT_CLINIC_OR_DEPARTMENT_OTHER): Payer: 59 | Admitting: Internal Medicine

## 2023-04-20 DIAGNOSIS — E10621 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: Secondary | ICD-10-CM | POA: Diagnosis not present

## 2023-04-20 LAB — GLUCOSE, CAPILLARY
Glucose-Capillary: 194 mg/dL — ABNORMAL HIGH (ref 70–99)
Glucose-Capillary: 197 mg/dL — ABNORMAL HIGH (ref 70–99)

## 2023-04-20 NOTE — Progress Notes (Signed)
[name] ([id number]) [id number] Page 1 of 2 Visit Report for 04/20/2023 HBO Details Patient Name: Date of Service: [name], [name] 04/20/2023 7:30 A M Medical Record Number: [id number] Patient Account Number: [id number] Date of Birth/Sex: Treating RN: Sep 03, 1965 (57 y.o. [name], [name] Primary Care Maxwel Meadowcroft: [name] Other Clinician: [name] Referring Lavinia Mcneely: Treating Kala Gassmann/Extender: Theodis Sato in Treatment: 16 HBO Treatment Course Details Treatment Course Number: 1 Ordering Marcellina Jonsson: Geralyn Corwin T Treatments Ordered: otal 60 HBO Treatment Start Date: 02/16/2023 HBO Indication: Other (specify in Notes) Notes: Acute peripheral arterial insufficiency HBO Treatment Details Treatment Number: 35 Patient Type: Outpatient Chamber Type: Monoplace Chamber Serial #: [id number] Treatment Protocol: 2.0 ATA with 90 minutes oxygen, with two 5 minute air breaks Treatment Details Compression Rate Down: 1.5 psi / minute De-Compression Rate Up: 2.0 psi / minute A breaks and breathing ir Compress Tx Pressure periods Decompress Decompress Begins Reached (leave unused spaces Begins Ends blank) Chamber Pressure (ATA 1 2 2 2 2 2  --2 1 ) Clock Time (24 hr) 08:00 08:11 08:41 08:46 09:16 09:21 - - 09:51 10:00 Treatment Length: 120 (minutes) Treatment Segments: 4 Vital Signs Capillary Blood Glucose Reference Range: 80 - 120 mg / dl HBO Diabetic Blood Glucose Intervention Range: <131 mg/dl or >086 mg/dl Type: Time Vitals Blood Respiratory Capillary Blood Glucose Pulse Action Pulse: Temperature: Taken: Pressure: Rate: Glucose (mg/dl): Meter #: Oximetry (%) Taken: Pre 07:40 127/63 77 18 97.9 194 none per protocol Post 10:07 131/58 67 18 97.2 197 none per protocol Treatment Response Treatment Toleration: Well Treatment Completion Status: Treatment Completed without Adverse Event Treatment Notes [name]  arrived with normal vital signs. Patient described breakfast eaten at 0730. He prepared for treatment. After performing a safety check, patient was placed in the chamber which was compressed with 100% oxygen at a rate of 2 psi/min after confirming normal ear equalization. He tolerated treatment and the subsequent decompression of the chamber at 2 psi/min. His post-treatment vital signs were within normal range except diastolic BP of 58 mmHg. He denied any symptoms of hypotension. He denied issues with ear equalization and pain associated with barotrauma. He was stable upon discharge. [name] Notes No concerns with treatment given Physician HBO Attestation: I certify that I supervised this HBO treatment in accordance with Medicare guidelines. A trained emergency response team is readily available per Yes hospital policies and procedures. Continue HBOT as ordered. Yes Electronic Signature(s) Signed: 04/20/2023 5:34:19 PM By: [name] MD Previous Signature: 04/20/2023 2:04:01 PM Version By: [name] CHT EMT BS , , Entered By: [name] on 04/20/2023 14:27:27 [name] ([id number]) [id number] Page 2 of 2 -------------------------------------------------------------------------------- HBO Safety Checklist Details Patient Name: Date of Service: [name], [name] 04/20/2023 7:30 A M Medical Record Number: [id number] Patient Account Number: [id number] Date of Birth/Sex: Treating RN: Jun 22, 1966 (57 y.o. [name] Primary Care Arion Morgan: [name] Other Clinician: [name] Referring Maeson Lourenco: Treating Mohsen Odenthal/Extender: Theodis Sato in Treatment: 16 HBO Safety Checklist Items Safety Checklist Consent Form Signed Patient voided / foley secured and emptied 0700 - Egg and Cheese, Sausage Biscuit, When did you last eato Protein Shake Last dose of injectable or oral agent 0630 - 17 Units  Lantus Ostomy pouch emptied and vented if applicable NA All implantable devices assessed, documented and approved NA Freestyle Libre 2 and3 on approved list Intravenous access site secured and place NA Valuables secured Linens and cotton and cotton/polyester blend (less than 51% polyester)  Personal oil-based products / skin lotions / body lotions removed Wigs or hairpieces removed NA Smoking or tobacco materials removed NA Books / newspapers / magazines / loose paper removed Cologne, aftershave, perfume and deodorant removed Jewelry removed (may wrap wedding band) Make-up removed Hair care products removed Battery operated devices (external) removed Heating patches and chemical warmers removed Titanium eyewear removed Nail polish cured greater than 10 hours NA Casting material cured greater than 10 hours NA Hearing aids removed NA Loose dentures or partials removed NA Prosthetics have been removed NA Patient demonstrates correct use of air break device (if applicable) Patient concerns have been addressed Patient grounding bracelet on and cord attached to chamber Specifics for Inpatients (complete in addition to above) Medication sheet sent with patient NA Intravenous medications needed or due during therapy sent with patient NA Drainage tubes (e.g. nasogastric tube or chest tube secured and vented) NA Endotracheal or Tracheotomy tube secured NA Cuff deflated of air and inflated with saline NA Airway suctioned NA Notes Paper version used prior to treatment start. Electronic Signature(s) Signed: 04/20/2023 2:00:40 PM By: [name] CHT EMT BS , , Entered By: [name] on 04/20/2023 11:00:40

## 2023-04-20 NOTE — Progress Notes (Signed)
SAIGE, ANGELL ([id number]) [id number] Page 1 of 2 Visit Report for 04/20/2023 Arrival Information Details Patient Name: Date of Service: [name], [name] 04/20/2023 7:30 A M Medical Record Number: [id number] Patient Account Number: [id number] Date of Birth/Sex: Treating RN: 1966/06/15 (57 y.o. [name], [name] Primary Care [name]: [name] Other Clinician: [name] Referring [name]: Treating [name]/Extender: Theodis Sato in Treatment: 16 Visit Information History Since Last Visit All ordered tests and consults were completed: Yes Patient Arrived: Ambulatory Added or deleted any medications: No Arrival Time: 07:30 Any new allergies or adverse reactions: No Accompanied By: self Had a fall or experienced change in No Transfer Assistance: None activities of daily living that may affect Patient Identification Verified: Yes risk of falls: Secondary Verification Process Completed: Yes Signs or symptoms of abuse/neglect since last visito No Patient Requires Transmission-Based Precautions: No Hospitalized since last visit: No Patient Has Alerts: No Implantable device outside of the clinic excluding No cellular tissue based products placed in the center since last visit: Pain Present Now: No Electronic Signature(s) Signed: 04/20/2023 1:58:16 PM By: [name] CHT EMT BS , , Entered By: [name] on 04/20/2023 10:58:16 -------------------------------------------------------------------------------- Encounter Discharge Information Details Patient Name: Date of Service: [name] Pont, [name] [name] 04/20/2023 7:30 A M Medical Record Number: [id number] Patient Account Number: [id number] Date of Birth/Sex: Treating RN: 1965-10-19 (57 y.o. [name] Primary Care Lucciana Head: [name] Other Clinician: [name] Referring Hara Milholland: Treating [name]/Extender: Theodis Sato in Treatment: 16 Encounter Discharge Information Items Discharge Condition: Stable Ambulatory Status: Ambulatory Discharge Destination: Home Transportation: Private Auto Accompanied By: self Schedule Follow-up Appointment: No Clinical Summary of Care: Electronic Signature(s) Signed: 04/20/2023 2:05:53 PM By: [name] CHT EMT BS , , Entered By: [name] on 04/20/2023 11:05:53 [name] ([id number]) [id number] Page 2 of 2 -------------------------------------------------------------------------------- Vitals Details Patient Name: Date of Service: [name], [name] 04/20/2023 7:30 A M Medical Record Number: [id number] Patient Account Number: [id number] Date of Birth/Sex: Treating RN: [birth year] (57 y.o. [name], [name] Primary Care Christie Viscomi: [name] Other Clinician: [name] Referring Sharone Picchi: Treating Iola Turri/Extender: Theodis Sato in Treatment: 16 Vital Signs Time Taken: 07:40 Temperature (F): 97.9 Height (in): 68 Pulse (bpm): 77 Weight (lbs): 200 Respiratory Rate (breaths/min): 18 Body Mass Index (BMI): 30.4 Blood Pressure (mmHg): 127/63 Capillary Blood Glucose (mg/dl): 244 Reference Range: 80 - 120 mg / dl Electronic Signature(s) Signed: 04/20/2023 1:58:39 PM By: [name] CHT EMT BS , , Entered By: [name] on 04/20/2023 10:58:39

## 2023-04-21 ENCOUNTER — Encounter (HOSPITAL_BASED_OUTPATIENT_CLINIC_OR_DEPARTMENT_OTHER): Payer: 59 | Admitting: Internal Medicine

## 2023-04-21 DIAGNOSIS — E10621 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: Secondary | ICD-10-CM | POA: Diagnosis not present

## 2023-04-21 LAB — GLUCOSE, CAPILLARY
Glucose-Capillary: 171 mg/dL — ABNORMAL HIGH (ref 70–99)
Glucose-Capillary: 78 mg/dL (ref 70–99)

## 2023-04-21 NOTE — Progress Notes (Addendum)
TREYLON, REHOR ([id number]) [id number] Page 1 of 2 Visit Report for 04/21/2023 HBO Details Patient Name: Date of Service: [place], [name] 04/21/2023 10:00 A M Medical Record Number: [id number] Patient Account Number: [id number] Date of Birth/Sex: Treating RN: 1966-05-11 (57 y.o. [name] Primary Care Lashunda Greis: [name] Other Clinician: Karl Bales Referring Faruq Rosenberger: Treating Joyia Riehle/Extender: Theodis Sato in Treatment: 16 HBO Treatment Course Details Treatment Course Number: 1 Ordering Birgitta Uhlir: Geralyn Corwin T Treatments Ordered: otal 60 HBO Treatment Start Date: 02/16/2023 HBO Indication: Other (specify in Notes) Notes: Acute peripheral arterial insufficiency HBO Treatment Details Treatment Number: 36 Patient Type: Outpatient Chamber Type: Monoplace Chamber Serial #: [id number] Treatment Protocol: 2.0 ATA with 90 minutes oxygen, with two 5 minute air breaks Treatment Details Compression Rate Down: 2.0 psi / minute De-Compression Rate Up: 2.0 psi / minute A breaks and breathing ir Compress Tx Pressure periods Decompress Decompress Begins Reached (leave unused spaces Begins Ends blank) Chamber Pressure (ATA 1 2 2 2 2 2  --2 1 ) Clock Time (24 hr) 10:39 10:47 11:17 11:22 11:52 11:57 - - 12:27 12:35 Treatment Length: 116 (minutes) Treatment Segments: 4 Vital Signs Capillary Blood Glucose Reference Range: 80 - 120 mg / dl HBO Diabetic Blood Glucose Intervention Range: <131 mg/dl or >962 mg/dl Type: Time Vitals Blood Pulse: Respiratory Temperature: Capillary Blood Glucose Pulse Action Taken: Pressure: Rate: Glucose (mg/dl): Meter #: Oximetry (%) Taken: Pre 10:15 130/67 72 18 97.5 171 Post 12:44 128/69 71 18 97.4 78 Patient given Glucerna. Waited for after. Treatment Response Treatment Toleration: Well Treatment Completion Status: Treatment Completed without Adverse Event Treatment Notes Post  blood sugar was 78. The patient was given 8 oz of Glucerna to drink and waited 15 min before leaving. The patient was asked how he felt before leaving. He stated that he felt OK. Idora Brosious Notes No concerns with treatment given Physician HBO Attestation: I certify that I supervised this HBO treatment in accordance with Medicare guidelines. A trained emergency response team is readily available per Yes hospital policies and procedures. Continue HBOT as ordered. Yes Electronic Signature(s) Signed: 04/21/2023 4:34:08 PM By: [name] MD Previous Signature: 04/21/2023 2:10:12 PM Version By: Karl Bales EMT Entered By: [name] on 04/21/2023 13:33:24 [name] ([id number]) [id number] Page 2 of 2 -------------------------------------------------------------------------------- HBO Safety Checklist Details Patient Name: Date of Service: [place], [name] 04/21/2023 10:00 A M Medical Record Number: [id number] Patient Account Number: [id number] Date of Birth/Sex: Treating RN: [birth year] (57 y.o. [name] Primary Care Smita Lesh: [name] Other Clinician: Karl Bales Referring Kamarah Bilotta: Treating Texie Tupou/Extender: Theodis Sato in Treatment: 16 HBO Safety Checklist Items Safety Checklist Consent Form Signed Patient voided / foley secured and emptied When did you last eato 0900 Last dose of injectable or oral agent 0830 17 units Lantus Ostomy pouch emptied and vented if applicable NA All implantable devices assessed, documented and approved NA Intravenous access site secured and place NA Valuables secured Linens and cotton and cotton/polyester blend (less than 51% polyester) Personal oil-based products / skin lotions / body lotions removed Wigs or hairpieces removed NA Smoking or tobacco materials removed Books / newspapers / magazines / loose paper removed Cologne, aftershave, perfume and  deodorant removed Jewelry removed (may wrap wedding band) Make-up removed NA Hair care products removed Battery operated devices (external) removed Heating patches and chemical warmers removed Titanium eyewear removed NA Nail polish cured greater than 10 hours NA Casting material cured greater than 10  hours NA Hearing aids removed NA Loose dentures or partials removed NA Prosthetics have been removed NA Patient demonstrates correct use of air break device (if applicable) Patient concerns have been addressed Patient grounding bracelet on and cord attached to chamber Specifics for Inpatients (complete in addition to above) Medication sheet sent with patient NA Intravenous medications needed or due during therapy sent with patient NA Drainage tubes (e.g. nasogastric tube or chest tube secured and vented) NA Endotracheal or Tracheotomy tube secured NA Cuff deflated of air and inflated with saline NA Airway suctioned NA Notes The safety checklist was done before the treatment was started. Electronic Signature(s) Signed: 04/21/2023 2:10:51 PM By: Karl Bales EMT Previous Signature: 04/21/2023 2:07:23 PM Version By: Karl Bales EMT Entered By: Karl Bales on 04/21/2023 11:10:48

## 2023-04-21 NOTE — Progress Notes (Signed)
BUDD, [name] ([id number]) [id number] Page 1 of 2 Visit Report for 04/21/2023 Problem List Details Patient Name: Date of Service: [name], [name] 04/21/2023 10:00 A M Medical Record Number: [id number] Patient Account Number: [id number] Date of Birth/Sex: Treating RN: 08-23-1965 (57 y.o. 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 12/25/2022 No Yes specified severity I70.245 Atherosclerosis of native arteries of left leg with ulceration of 01/27/2023 No Yes other part of foot 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 12/25/2022 No Yes J44.9 Chronic obstructive pulmonary disease, unspecified 12/25/2022 No Yes M06.9 Rheumatoid arthritis, unspecified 12/25/2022 No Yes Z87.891 Personal history of nicotine dependence 01/27/2023 No Yes Inactive Problems Resolved Problems Electronic Signature(s) Signed: 04/21/2023 2:11:44 PM By: Karl Bales EMT Signed: 04/21/2023 4:34:08 PM By: [name] MD Entered By: Karl Bales on 04/21/2023 11:11:44 -------------------------------------------------------------------------------- SuperBill Details Patient Name: Date of Service: [name], [name] 04/21/2023 Medical Record Number: [id number] Patient Account Number: [id number] Date of Birth/Sex: Treating RN: [birth year] (57 y.o. [name] Primary Care Provider: [name] Other Clinician: Karl Bales Referring Provider: Treating Provider/Extender: [name], [name], [name] ([id number]) [id number] Page 2 of 2 Weeks in Treatment: 16 Diagnosis Coding ICD-10 Codes Code Description L97.528  Non-pressure chronic ulcer of other part of left foot with other specified severity I70.245 Atherosclerosis of native arteries of left leg with ulceration of other part of foot 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 J44.9 Chronic obstructive pulmonary disease, unspecified M06.9 Rheumatoid arthritis, unspecified Z87.891 Personal history of nicotine dependence Facility Procedures : CPT4 Code Description: [id number] G0277-(Facility Use Only) HBOT full body chamber, , ICD-10 Diagnosis Description I70.245 Atherosclerosis of native arteries of left leg with ulceratio L97.528 Non-pressure chronic ulcer of other part of left foot  with ot 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 Modifier: n of other part o her specified sev Quantity: 4 f foot erity Physician Procedures : CPT4 Code Description Modifier [id number] - WC PHYS HYPERBARIC OXYGEN THERAPY ICD-10 Diagnosis Description I70.245 Atherosclerosis of native arteries of left leg with ulceration of other part o L97.528 Non-pressure chronic ulcer of other part of  left foot with other specified sev 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 Quantity: 1 f foot erity Electronic Signature(s) Signed: 04/21/2023 2:11:40 PM By: Karl Bales EMT Signed: 04/21/2023 4:34:08 PM By: [name] MD Entered By: Karl Bales on 04/21/2023 11:11:38

## 2023-04-21 NOTE — Progress Notes (Signed)
RANDELL, BESSETT ([id number]) [id number] Page 1 of 2 Visit Report for 04/21/2023 Arrival Information Details Patient Name: Date of Service: [name], [name] 04/21/2023 10:00 A M Medical Record Number: [id number] Patient Account Number: [id number] Date of Birth/Sex: Treating RN: 1965-09-21 (57 y.o. [name] Primary Care [name]: [name] Other Clinician: Karl [name] Referring [name]: Treating [name]/Extender: [name] in Treatment: 16 Visit Information History Since Last Visit All ordered tests and consults were completed: Yes Patient Arrived: Ambulatory Added or deleted any medications: No Arrival Time: 10:10 Any new allergies or adverse reactions: No Accompanied By: None Had a fall or experienced change in No Transfer Assistance: None activities of daily living that may affect Patient Identification Verified: Yes risk of falls: Secondary Verification Process Completed: Yes Signs or symptoms of abuse/neglect since last visito No Patient Requires Transmission-Based Precautions: No Hospitalized since last visit: No Patient Has Alerts: No Implantable device outside of the clinic excluding No cellular tissue based products placed in the center since last visit: Pain Present Now: No Electronic Signature(s) Signed: 04/21/2023 2:05:56 PM By: [name] EMT Entered By: [name] on 04/21/2023 11:05:55 -------------------------------------------------------------------------------- Encounter Discharge Information Details Patient Name: Date of Service: [name], [name] [name] 04/21/2023 10:00 A M Medical Record Number: [id number] Patient Account Number: [id number] Date of Birth/Sex: Treating RN: 08-10-1965 (57 y.o. [name] Primary Care [name]: [name] Other Clinician: Karl [name] Referring [name]: Treating [name]/Extender: [name] in  Treatment: 16 Encounter Discharge Information Items Discharge Condition: Stable Ambulatory Status: Ambulatory Discharge Destination: Home Transportation: Private Auto Accompanied By: None Schedule Follow-up Appointment: Yes Clinical Summary of Care: Electronic Signature(s) Signed: 04/21/2023 2:12:14 PM By: [name] EMT Entered By: [name] on 04/21/2023 11:12:13 [name] ([id number]) [id number] Page 2 of 2 -------------------------------------------------------------------------------- Vitals Details Patient Name: Date of Service: [name], [name] 04/21/2023 10:00 A M Medical Record Number: [id number] Patient Account Number: [id number] Date of Birth/Sex: Treating RN: 1965-10-27 (57 y.o. [name] Primary Care [name]: [name] Other Clinician: Karl [name] Referring [name]: Treating [name]/Extender: [name] in Treatment: 16 Vital Signs Time Taken: 10:15 Temperature (F): 97.5 Height (in): 68 Pulse (bpm): 72 Weight (lbs): 200 Respiratory Rate (breaths/min): 18 Body Mass Index (BMI): 30.4 Blood Pressure (mmHg): 130/67 Capillary Blood Glucose (mg/dl): 403 Reference Range: 80 - 120 mg / dl Electronic Signature(s) Signed: 04/21/2023 2:06:25 PM By: [name] EMT Entered By: [name] on 04/21/2023 11:06:25

## 2023-04-21 NOTE — Progress Notes (Signed)
[name], METCALF ([id number]) [id number] Page 1 of 1 Visit Report for 04/20/2023 SuperBill Details Patient Name: Date of Service: [place], [name] 04/20/2023 Medical Record Number: [id number] Patient Account Number: [id number] Date of Birth/Sex: Treating RN: 1965/09/09 (57 y.o. 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 J44.9 Chronic obstructive pulmonary disease, unspecified M06.9 Rheumatoid arthritis, unspecified Z87.891 Personal history of nicotine dependence Facility Procedures CPT4 Code Description Modifier Quantity [id number] G0277-(Facility Use Only) HBOT full body chamber, , 4 ICD-10 Diagnosis Description 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 Physician Procedures Quantity CPT4 Code Description Modifier [id number] 99183 - WC PHYS HYPERBARIC OXYGEN THERAPY 1 ICD-10 Diagnosis Description 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

## 2023-04-22 ENCOUNTER — Encounter: Payer: Self-pay | Admitting: Family

## 2023-04-22 ENCOUNTER — Ambulatory Visit (INDEPENDENT_AMBULATORY_CARE_PROVIDER_SITE_OTHER): Payer: 59 | Admitting: Family

## 2023-04-22 ENCOUNTER — Encounter (HOSPITAL_BASED_OUTPATIENT_CLINIC_OR_DEPARTMENT_OTHER): Payer: 59 | Admitting: Physician Assistant

## 2023-04-22 DIAGNOSIS — L97423 Non-pressure chronic ulcer of left heel and midfoot with necrosis of muscle: Secondary | ICD-10-CM

## 2023-04-22 DIAGNOSIS — E10621 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: Secondary | ICD-10-CM | POA: Diagnosis not present

## 2023-04-22 LAB — GLUCOSE, CAPILLARY
Glucose-Capillary: 146 mg/dL — ABNORMAL HIGH (ref 70–99)
Glucose-Capillary: 205 mg/dL — ABNORMAL HIGH (ref 70–99)

## 2023-04-22 NOTE — Progress Notes (Addendum)
CLOVER, WARR ([id number]) [id number] Page 1 of 2 Visit Report for 04/22/2023 HBO Details Patient Name: Date of Service: [place], [name] 04/22/2023 7:30 A M Medical Record Number: [id number] Patient Account Number: [id number] Date of Birth/Sex: Treating RN: [birth year] (57 y.o. [name] Primary Care [name]: [name] Other Clinician: Karl [name] Referring [name]: Treating [name]/Extender: [name] in Treatment: 16 HBO Treatment Course Details Treatment Course Number: 1 Ordering [name]: [name] T Treatments Ordered: otal 60 HBO Treatment Start Date: 02/16/2023 HBO Indication: Other (specify in Notes) Notes: Acute peripheral arterial insufficiency HBO Treatment Details Treatment Number: 37 Patient Type: Outpatient Chamber Type: Monoplace Chamber Serial #: [id number] Treatment Protocol: 2.0 ATA with 90 minutes oxygen, with two 5 minute air breaks Treatment Details Compression Rate Down: 2.0 psi / minute De-Compression Rate Up: 2.0 psi / minute A breaks and breathing ir Compress Tx Pressure periods Decompress Decompress Begins Reached (leave unused spaces Begins Ends blank) Chamber Pressure (ATA 1 2 2 2 2 2  --2 1 ) Clock Time (24 hr) 08:07 08:16 08:47 08:52 09:22 09:27 - - 09:57 10:05 Treatment Length: 118 (minutes) Treatment Segments: 4 Vital Signs Capillary Blood Glucose Reference Range: 80 - 120 mg / dl HBO Diabetic Blood Glucose Intervention Range: <131 mg/dl or >308 mg/dl Time Vitals Blood Respiratory Capillary Blood Glucose Pulse Action Type: Pulse: Temperature: Taken: Pressure: Rate: Glucose (mg/dl): Meter #: Oximetry (%) Taken: Pre 07:45 120/61 70 18 97.3 146 Post 10:10 120/65 68 18 97.5 205 Treatment Response Treatment Toleration: Well Treatment Completion Status: Treatment Completed without Adverse Event Electronic Signature(s) Signed: 04/22/2023 1:36:48 PM By:  [name] EMT Signed: 04/22/2023 3:48:11 PM By: [name] Derry PA-C Entered By: [name] on 04/22/2023 10:36:48 -------------------------------------------------------------------------------- HBO Safety Checklist Details Patient Name: Date of Service: [name] Pont, PA [name] J. 04/22/2023 7:30 A M Medical Record Number: [id number] Patient Account Number: [id number] Date of Birth/Sex: Treating RN: [birth year] (57 y.o. [name] Primary Care [name]: [name] Other Clinician: Karl [name] Referring [name]: Treating [name]/Extender: [name], [name] ([id number]) [id number] Page 2 of 2 Weeks in Treatment: 16 HBO Safety Checklist Items Safety Checklist Consent Form Signed Patient voided / foley secured and emptied When did you last eato 0700 Last dose of injectable or oral agent 0630 17 units Lantus Ostomy pouch emptied and vented if applicable NA All implantable devices assessed, documented and approved NA Intravenous access site secured and place NA Valuables secured Linens and cotton and cotton/polyester blend (less than 51% polyester) Personal oil-based products / skin lotions / body lotions removed Wigs or hairpieces removed NA Smoking or tobacco materials removed Books / newspapers / magazines / loose paper removed Cologne, aftershave, perfume and deodorant removed Jewelry removed (may wrap wedding band) Make-up removed NA Hair care products removed Battery operated devices (external) removed Libre Heating patches and chemical warmers removed Titanium eyewear removed NA Nail polish cured greater than 10 hours NA Casting material cured greater than 10 hours NA Hearing aids removed NA Loose dentures or partials removed NA Prosthetics have been removed NA Patient demonstrates correct use of air break device (if applicable) Patient concerns have been addressed Patient grounding  bracelet on and cord attached to chamber Specifics for Inpatients (complete in addition to above) Medication sheet sent with patient NA Intravenous medications needed or due during therapy sent with patient NA Drainage tubes (e.g. nasogastric tube or chest tube secured and vented) NA Endotracheal or Tracheotomy tube secured NA Cuff deflated  of air and inflated with saline NA Airway suctioned NA Notes The safety checklist was done before the treatment was started. Electronic Signature(s) Signed: 04/22/2023 1:35:21 PM By: [name] EMT Entered By: [name] on 04/22/2023 10:35:21

## 2023-04-22 NOTE — Progress Notes (Addendum)
BASIM, STEMM ([id number]) [id number] Page 1 of 2 Visit Report for 04/22/2023 Arrival Information Details Patient Name: Date of Service: [place], [name] 04/22/2023 7:30 A M Medical Record Number: [id number] Patient Account Number: [id number] Date of Birth/Sex: Treating RN: 01/20/1966 (57 y.o. [name] Primary Care Lataja Newland: [name] Other Clinician: [name] Referring Lella Mullany: Treating Dontario Evetts/Extender: [name] in Treatment: 16 Visit Information History Since Last Visit All ordered tests and consults were completed: Yes Patient Arrived: Ambulatory Added or deleted any medications: No Arrival Time: 07:40 Any new allergies or adverse reactions: No Accompanied By: self Had a fall or experienced change in No Transfer Assistance: None activities of daily living that may affect Patient Identification Verified: Yes risk of falls: Secondary Verification Process Completed: Yes Signs or symptoms of abuse/neglect since last visito No Patient Requires Transmission-Based Precautions: No Hospitalized since last visit: No Patient Has Alerts: No Implantable device outside of the clinic excluding No cellular tissue based products placed in the center since last visit: Pain Present Now: No Electronic Signature(s) Signed: 04/22/2023 8:30:31 AM By: [name] CHT EMT BS , , Entered By: [name] on 04/22/2023 05:30:30 -------------------------------------------------------------------------------- Encounter Discharge Information Details Patient Name: Date of Service: [name], [name] 04/22/2023 7:30 A M Medical Record Number: [id number] Patient Account Number: [id number] Date of Birth/Sex: Treating RN: [birth year] (57 y.o. [name] Primary Care Saquan Furtick: [name] Other Clinician: Karl Bales Referring Reyhan Moronta: Treating Tanee Henery/Extender: [name] in Treatment: [phone] Encounter Discharge Information Items Discharge Condition: Stable Ambulatory Status: Ambulatory Discharge Destination: Home Transportation: Private Auto Accompanied By: None Schedule Follow-up Appointment: Yes Clinical Summary of Care: Electronic Signature(s) Signed: 04/22/2023 1:38:56 PM By: Karl Bales EMT Entered By: Karl Bales on 04/22/2023 10:38:56 [name] ([id number]) [id number] Page 2 of 2 -------------------------------------------------------------------------------- Vitals Details Patient Name: Date of Service: [place], [name] 04/22/2023 7:30 A M Medical Record Number: [id number] Patient Account Number: [id number] Date of Birth/Sex: Treating RN: [birth year] (57 y.o. [name] Primary Care Peggyann Zwiefelhofer: [name] Other Clinician: [name] Referring Shavon Zenz: Treating Latisia Hilaire/Extender: [name] in Treatment: 16 Vital Signs Time Taken: 07:45 Temperature (F): 97.3 Height (in): 68 Pulse (bpm): 70 Weight (lbs): 200 Respiratory Rate (breaths/min): 18 Body Mass Index (BMI): 30.4 Blood Pressure (mmHg): 120/61 Reference Range: 80 - 120 mg / dl Electronic Signature(s) Signed: 04/22/2023 8:30:59 AM By: [name] CHT EMT BS , , Entered By: [name] on 04/22/2023 05:30:59

## 2023-04-22 NOTE — Progress Notes (Signed)
Like  Post-Op Visit Note   Patient: [name]           Date of Birth: 17-Oct-1965           MRN: [id number] Visit Date: 04/22/2023 PCP: Garlan Fillers, MD  Chief Complaint:  Chief Complaint  Patient presents with   Left Leg - Routine Post Op    03/04/23 left achilles debridement    HPI:  HPI The patient is a 57 year old gentleman who is seen in follow-up status post left Achilles debridement on July 31 he has had Kerecis micro powder applied in the office, donated.  Was last seen 1 week ago.  Has been working on home exercise program range of motion foot and ankle  Ortho Exam On examination of the left Achilles area of the ulcer is 100% filled in with granulation there is good uptake of the Kerecis powder.  There is no longer any underlying or folds within the wound bed.  No tunneling.  It is flat with granulation.  Today measures 2 cm x 1.2 cm  No erythema warmth or sign of infection  Visit Diagnoses: No diagnosis found.  Plan: Kerecis micro powder donation reapplied today.  Will change overlying gauze daily as instructed daily for the first week he will follow-up in the office in 1 more week for reevaluation  Follow-Up Instructions: No follow-ups on file.   Imaging: No results found.  Orders:  No orders of the defined types were placed in this encounter.  No orders of the defined types were placed in this encounter.    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 03/22/2020   Osteopenia of multiple sites 02/28/2020   PVD (peripheral vascular disease) (HCC) 10/11/2019   Chronic venous insufficiency 10/05/2018   Diabetic cataract (HCC) 05/21/2018   Controlled type 1 diabetes mellitus with diabetic peripheral angiopathy without  gangrene (HCC) 08/14/2017   Dyslipidemia 03/26/2017   High risk medication use 09/23/2016   History of hypertension 09/23/2016   History of chronic kidney disease 09/23/2016   History of coronary artery disease 09/23/2016   Tobacco abuse 09/23/2016   Primary osteoarthritis of both knees 09/23/2016   History of diabetes mellitus 09/23/2016   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 (HCC) 11/20/2015   Chest pain with high risk for cardiac etiology 10/29/2015   Asymptomatic bilateral carotid artery stenosis 06/08/2014   Pleural effusion, bilateral 06/03/2014   Dyspnea 06/01/2014   Diabetes (HCC) 05/03/2014   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/03/2014   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   Chronic renal disease, stage 3, moderately decreased glomerular filtration rate between 30-59 mL/min/1.73 square meter (HCC) 05/03/2014   CAD (coronary artery disease) 04/27/2014   Acne 12/19/2013   On isotretinoin therapy 12/19/2013   Nuclear sclerotic cataract, bilateral 12/19/2011   Vitreous hemorrhage (HCC) 06/16/2011   Past Medical History:  Diagnosis Date   Anginal pain (HCC)    Anxiety    Arthritis    RA IN HANDS   Asthma    CAD (coronary artery disease) 04/27/2014   Chronic renal disease, stage 3, moderately decreased glomerular filtration rate between 30-59 mL/min/1.73 square meter (HCC) 05/03/2014   COPD (chronic  obstructive pulmonary disease) (HCC)    Coronary artery disease    Diabetes (HCC) 05/03/2014   Diabetes mellitus without complication (HCC)    type 1   Hyperlipidemia    Left shoulder pain    Peripheral vascular disease (HCC)    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/03/2014   Shortness of breath    Sleep apnea    mild OSA, no CPAP   Unstable angina pectoris (HCC) 04/25/2014    Family History  Problem Relation Age of Onset   Stomach cancer  Mother    Cancer Mother    Prostate cancer Father    Heart disease Father    Cancer - Prostate Father    Heart disease Brother    Asthma Daughter    Healthy Daughter    Asthma Daughter     Past Surgical History:  Procedure Laterality Date   ABDOMINAL AORTOGRAM W/LOWER EXTREMITY N/A 01/15/2023   Procedure: ABDOMINAL AORTOGRAM W/LOWER EXTREMITY;  Surgeon: Cephus Shelling, MD;  Location: [facility] INVASIVE CV LAB;  Service: Cardiovascular;  Laterality: N/A;   CARDIAC CATHETERIZATION  04/25/2014   BY DR Jacinto Halim   CARDIAC CATHETERIZATION N/A 10/30/2015   Procedure: Left Heart Cath and Cors/Grafts Angiography;  Surgeon: Yates Decamp, MD;  Location: [facility] INVASIVE CV LAB;  Service: Cardiovascular;  Laterality: N/A;   CARPAL TUNNEL RELEASE     CATARACT EXTRACTION, BILATERAL     CORONARY ARTERY BYPASS GRAFT N/A 04/27/2014   Procedure: CORONARY ARTERY BYPASS GRAFTING on pump using left internal mammary artery to LAD coronary artery, right great saphenous vein graft to diagonal coronary artery with sequential to OM1 and circumflex coronary arteries. Right greater saphenous vein graft to posterior descending coronary artery. ;  Surgeon: Delight Ovens, MD;  Location: [facility] OR;  Service: Open Heart Surgery;  Laterality: N   elbow drained Left 05/09/2019   ENDOVEIN HARVEST OF GREATER SAPHENOUS VEIN Right 04/27/2014   Procedure: ENDOVEIN HARVEST OF GREATER SAPHENOUS VEIN;  Surgeon: Delight Ovens, MD;  Location: [facility] OR;  Service: Open Heart Surgery;  Laterality: Right;   EXCISION ORAL TUMOR N/A 03/01/2018   Procedure: EXCISION ORAL TUMOR;  Surgeon: Christia Reading, MD;  Location: [facility];  Service: ENT;  Laterality: N/A;   EYE SURGERY     LASER   EYE SURGERY Bilateral    astigmatism correction    HARDWARE REMOVAL Left 10/03/2020   Procedure: LEFT FOOT REMOVAL HARDWARE, PLACE VANC POWDER;  Surgeon: Nadara Mustard, MD;  Location: [facility] OR;  Service: Orthopedics;  Laterality: Left;   I & D EXTREMITY Left  03/04/2023   Procedure: LEFT ACHILLES DEBRIDEMENT;  Surgeon: Nadara Mustard, MD;  Location: [facility] OR;  Service: Orthopedics;  Laterality: Left;   INTRAOPERATIVE TRANSESOPHAGEAL ECHOCARDIOGRAM N/A 04/27/2014   Procedure: INTRAOPERATIVE TRANSESOPHAGEAL ECHOCARDIOGRAM;  Surgeon: Delight Ovens, MD;  Location: [facility] OR;  Service: Open Heart Surgery;  Laterality: N/A;   IR RADIOLOGIST EVAL & MGMT  12/22/2022   LEFT HEART CATHETERIZATION WITH CORONARY ANGIOGRAM N/A 04/25/2014   Procedure: LEFT HEART CATHETERIZATION WITH CORONARY ANGIOGRAM;  Surgeon: Pamella Pert, MD;  Location: [facility] CATH LAB;  Service: Cardiovascular;  Laterality: N/A;   MOUTH SURGERY  11/15/2017   tongue surgery    ORIF TOE FRACTURE Left 03/22/2020   Procedure: OPEN REDUCTION INTERNAL FIXATION (ORIF) Non [place] 5th Metatarsal;  Surgeon: Kathryne Hitch, MD;  Location: [facility];  Service: Orthopedics;  Laterality: Left;   Social History   Occupational  History   Not on file  Tobacco Use   Smoking status: Former    Current packs/day: 0.25    Average packs/day: 0.3 packs/day for 34.0 years (8.5 ttl pk-yrs)    Types: Cigarettes   Smokeless tobacco: Never  Vaping Use   Vaping status: Never Used  Substance and Sexual Activity   Alcohol use: Not Currently    Comment: RARE   Drug use: No   Sexual activity: Not on file

## 2023-04-22 NOTE — Progress Notes (Signed)
[name], [name] ([id number]) [id number] Page 1 of 2 Visit Report for 04/22/2023 Problem List Details Patient Name: Date of Service: [name], [name] 04/22/2023 7:30 A M Medical Record Number: [id number] Patient Account Number: [id number] Date of Birth/Sex: Treating RN: 02-14-1966 (57 y.o. 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 04/22/2023 Medical Record Number: [id number] Patient Account Number: [id number] Date of Birth/Sex: Treating RN: [birth year] (57 y.o. 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 J44.9 Chronic obstructive pulmonary disease, unspecified M06.9 Rheumatoid arthritis, unspecified Z87.891 Personal history of nicotine dependence Facility Procedures : CPT4 Code Description: [id number] G0277-(Facility Use Only) HBOT full body chamber, , ICD-10 Diagnosis Description I70.245 Atherosclerosis of native arteries of left leg with ulceratio L97.528 Non-pressure chronic ulcer of other part of left foot  with ot 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 Modifier: n of other part o her specified sev Quantity: 4 f foot erity Physician Procedures : CPT4 Code Description Modifier [id number] 43329 - WC PHYS HYPERBARIC OXYGEN THERAPY ICD-10 Diagnosis Description I70.245 Atherosclerosis of native arteries of left leg with ulceration of other part o L97.528 Non-pressure chronic ulcer of other part of  left foot with other specified sev 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 Quantity: 1 f foot erity Electronic Signature(s) Signed: 04/22/2023 1:37:57 PM By: Karl Bales EMT Signed: 04/22/2023 3:48:11 PM By: Allen Derry PA-C Entered By: Karl Bales on 04/22/2023 10:37:54

## 2023-04-23 ENCOUNTER — Encounter (HOSPITAL_BASED_OUTPATIENT_CLINIC_OR_DEPARTMENT_OTHER): Payer: 59 | Admitting: Internal Medicine

## 2023-04-23 DIAGNOSIS — E10621 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: Secondary | ICD-10-CM | POA: Diagnosis not present

## 2023-04-23 LAB — GLUCOSE, CAPILLARY
Glucose-Capillary: 213 mg/dL — ABNORMAL HIGH (ref 70–99)
Glucose-Capillary: 171 mg/dL — ABNORMAL HIGH (ref 70–99)

## 2023-04-23 NOTE — Progress Notes (Signed)
ELAY, TRANI ([id number]) [id number] Page 1 of 2 Visit Report for 04/23/2023 Arrival Information Details Patient Name: Date of Service: [place], [name] 04/23/2023 7:30 A M Medical Record Number: [id number] Patient Account Number: [id number] Date of Birth/Sex: Treating RN: 1966/06/23 (57 y.o. Leonor Liv, Leah Primary Care Anaisabel Pederson: [name] Other Clinician: [name] Referring Neziah Braley: Treating Maraki Macquarrie/Extender: Theodis Sato in Treatment: 17 Visit Information History Since Last Visit All ordered tests and consults were completed: Yes Patient Arrived: Ambulatory Added or deleted any medications: No Arrival Time: 07:31 Any new allergies or adverse reactions: No Accompanied By: self Had a fall or experienced change in No Transfer Assistance: None activities of daily living that may affect Patient Identification Verified: Yes risk of falls: Secondary Verification Process Completed: Yes Signs or symptoms of abuse/neglect since last visito No Patient Requires Transmission-Based Precautions: No Hospitalized since last visit: No Patient Has Alerts: No Implantable device outside of the clinic excluding No cellular tissue based products placed in the center since last visit: Pain Present Now: No Electronic Signature(s) Signed: 04/23/2023 1:32:16 PM By: [name] CHT EMT BS , , Entered By: [name] on 04/23/2023 13:32:15 -------------------------------------------------------------------------------- Encounter Discharge Information Details Patient Name: Date of Service: [name], [name] 04/23/2023 7:30 A M Medical Record Number: [id number] Patient Account Number: [id number] Date of Birth/Sex: Treating RN: 04-28-1966 (57 y.o. Beryle Quant Primary Care Freemon Binford: [name] Other Clinician: [name] Referring Dwain Huhn: Treating Kaleah Hagemeister/Extender: Theodis Sato in Treatment: 17 Encounter Discharge Information Items Discharge Condition: Stable Ambulatory Status: Ambulatory Discharge Destination: Home Transportation: Private Auto Accompanied By: self Schedule Follow-up Appointment: No Clinical Summary of Care: Electronic Signature(s) Signed: 04/23/2023 1:42:54 PM By: [name] CHT EMT BS , , Entered By: [name] on 04/23/2023 13:42:54 [place], [name] ([id number]) [id number] Page 2 of 2 -------------------------------------------------------------------------------- Vitals Details Patient Name: Date of Service: [place], [name] 04/23/2023 7:30 A M Medical Record Number: [id number] Patient Account Number: [id number] Date of Birth/Sex: Treating RN: 18-Sep-1965 (57 y.o. Leonor Liv, Tacey Ruiz Primary Care Candas Deemer: [name] Other Clinician: [name] Referring Erikson Danzy: Treating Deseree Zemaitis/Extender: Theodis Sato in Treatment: 17 Vital Signs Time Taken: 07:40 Temperature (F): 97.9 Height (in): 68 Pulse (bpm): 74 Weight (lbs): 200 Respiratory Rate (breaths/min): 18 Body Mass Index (BMI): 30.4 Blood Pressure (mmHg): 109/57 Capillary Blood Glucose (mg/dl): 329 Reference Range: 80 - 120 mg / dl Electronic Signature(s) Signed: 04/23/2023 1:33:20 PM By: [name] CHT EMT BS , , Entered By: [name] on 04/23/2023 13:33:19

## 2023-04-23 NOTE — Progress Notes (Addendum)
BRYCIN, CARRAWAY ([id number]) [id number] Page 1 of 2 Visit Report for 04/23/2023 HBO Details Patient Name: Date of Service: [place], [name] 04/23/2023 7:30 A M Medical Record Number: [id number] Patient Account Number: [id number] Date of Birth/Sex: Treating RN: 16-Jan-1966 (57 y.o. [name], [name] Primary Care Kemo Spruce: [name] Other Clinician: [name] Referring Dimitrius Steedman: Treating Ishia Tenorio/Extender: Theodis Sato in Treatment: 17 HBO Treatment Course Details Treatment Course Number: 1 Ordering Magda Muise: Geralyn Corwin T Treatments Ordered: otal 60 HBO Treatment Start Date: 02/16/2023 HBO Indication: Other (specify in Notes) Notes: Acute peripheral arterial insufficiency HBO Treatment Details Treatment Number: 38 Patient Type: Outpatient Chamber Type: Monoplace Chamber Serial #: [id number] Treatment Protocol: 2.0 ATA with 90 minutes oxygen, with two 5 minute air breaks Treatment Details Compression Rate Down: 1.5 psi / minute De-Compression Rate Up: 2.0 psi / minute A breaks and breathing ir Compress Tx Pressure periods Decompress Decompress Begins Reached (leave unused spaces Begins Ends blank) Chamber Pressure (ATA 1 2 2 2 2 2  --2 1 ) Clock Time (24 hr) 07:59 08:08 08:38 08:43 09:13 09:18 - - 09:48 09:57 Treatment Length: 118 (minutes) Treatment Segments: 4 Vital Signs Capillary Blood Glucose Reference Range: 80 - 120 mg / dl HBO Diabetic Blood Glucose Intervention Range: <131 mg/dl or >606 mg/dl Type: Time Vitals Blood Pulse: Respiratory Temperature: Capillary Blood Glucose Pulse Action Taken: Pressure: Rate: Glucose (mg/dl): Meter #: Oximetry (%) Taken: Pre 07:40 109/57 74 18 97.9 213 none per protocol Post 10:00 119/56 67 18 98 171 asymptomatic for hypotension Treatment Response Treatment Toleration: Well Treatment Completion Status: Treatment Completed without Adverse Event Treatment Notes Mr.  Aird arrived with normal vital signs except diastolic BP. He was asymptomatic for hypotension. He prepared for treatment. After performing a safety check, he was placed in the chamber which was compressed with 100% oxygen at a rate of 2 psi/min after confirming normal ear equalization. He tolerated the treatment and subsequent decompression of the chamber at the rate of 2 psi/min. He denied any issues with ear equalization and pain associated with barotrauma. His post treatment vital signs were within normal range except diastolic BP. Again, he denied any symptoms related to hypotension. He was stable upon discharge. Ulrick Methot Notes No concerns with treatment given Physician HBO Attestation: I certify that I supervised this HBO treatment in accordance with Medicare guidelines. A trained emergency response team is readily available per Yes hospital policies and procedures. Continue HBOT as ordered. Yes Electronic Signature(s) Signed: 04/23/2023 4:42:34 PM By: [name] MD Previous Signature: 04/23/2023 2:15:59 PM Version By: [name] CHT EMT BS , , Previous Signature: 04/23/2023 1:42:02 PM Version By: [name] CHT EMT BS , , Previous Signature: 04/23/2023 1:39:05 PM Version By: [name] CHT EMT BS , , Apo, [name] ([id number]) [id number] Page 2 of 2 Previous Signature: 04/23/2023 1:39:05 PM Version By: [name] CHT EMT BS , , Entered By: [name] on 04/23/2023 16:39:55 -------------------------------------------------------------------------------- HBO Safety Checklist Details Patient Name: Date of Service: [place], [name] 04/23/2023 7:30 A M Medical Record Number: [id number] Patient Account Number: [id number] Date of Birth/Sex: Treating RN: [birth year] (57 y.o. Beryle Quant Primary Care Ricca Melgarejo: [name] Other Clinician: [name] Referring Sharrell Krawiec: Treating Trequan Marsolek/Extender: Theodis Sato in Treatment: 17 HBO Safety Checklist Items Safety Checklist Consent Form Signed Patient voided / foley secured and emptied 0700 - Egg and Cheese Sandwich, Protein When did you last eato Drink, Coffee Last dose of injectable or oral  agent 0630 - 17 units Lantus Ostomy pouch emptied and vented if applicable NA All implantable devices assessed, documented and approved Freestyle Libre 2 and3 on approved list Intravenous access site secured and place NA Valuables secured Linens and cotton and cotton/polyester blend (less than 51% polyester) Personal oil-based products / skin lotions / body lotions removed Wigs or hairpieces removed NA Smoking or tobacco materials removed NA Books / newspapers / magazines / loose paper removed Cologne, aftershave, perfume and deodorant removed Jewelry removed (may wrap wedding band) Make-up removed NA Hair care products removed Libre Freestyle 2 and 3 Sensor Approved, other Battery operated devices (external) removed electronics removed. Heating patches and chemical warmers removed Titanium eyewear removed Nail polish cured greater than 10 hours NA Casting material cured greater than 10 hours NA Hearing aids removed NA Loose dentures or partials removed NA Prosthetics have been removed NA Patient demonstrates correct use of air break device (if applicable) Patient concerns have been addressed Patient grounding bracelet on and cord attached to chamber Specifics for Inpatients (complete in addition to above) Medication sheet sent with patient NA Intravenous medications needed or due during therapy sent with patient NA Drainage tubes (e.g. nasogastric tube or chest tube secured and vented) NA Endotracheal or Tracheotomy tube secured NA Cuff deflated of air and inflated with saline NA Airway suctioned NA Notes Paper version used prior to treatment start. Electronic Signature(s) Signed: 04/23/2023  1:35:53 PM By: [name] CHT EMT BS , , Entered By: [name] on 04/23/2023 13:35:53

## 2023-04-23 NOTE — Progress Notes (Signed)
JHAYDEN, KUTCHER ([id number]) [id number] Page 1 of 1 Visit Report for 04/23/2023 SuperBill Details Patient Name: Date of Service: [place], [name] 04/23/2023 Medical Record Number: [id number] Patient Account Number: [id number] Date of Birth/Sex: Treating RN: 12-26-1965 (57 y.o. 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 J44.9 Chronic obstructive pulmonary disease, unspecified M06.9 Rheumatoid arthritis, unspecified Z87.891 Personal history of nicotine dependence Facility Procedures CPT4 Code Description Modifier Quantity [id number] G0277-(Facility Use Only) HBOT full body chamber, , 4 ICD-10 Diagnosis Description 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 Physician Procedures Quantity CPT4 Code Description Modifier [id number] 99183 - WC PHYS HYPERBARIC OXYGEN THERAPY 1 ICD-10 Diagnosis Description 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

## 2023-04-24 ENCOUNTER — Encounter (HOSPITAL_BASED_OUTPATIENT_CLINIC_OR_DEPARTMENT_OTHER): Payer: 59 | Admitting: General Surgery

## 2023-04-24 DIAGNOSIS — E10621 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: Secondary | ICD-10-CM | POA: Diagnosis not present

## 2023-04-24 LAB — GLUCOSE, CAPILLARY
Glucose-Capillary: 289 mg/dL — ABNORMAL HIGH (ref 70–99)
Glucose-Capillary: 335 mg/dL — ABNORMAL HIGH (ref 70–99)

## 2023-04-24 NOTE — Progress Notes (Signed)
KADE, MAZZIOTTI ([id number]) [id number] Page 1 of 1 Visit Report for 04/24/2023 SuperBill Details Patient Name: Date of Service: [name], [name] 04/24/2023 Medical Record Number: [id number] Patient Account Number: [id number] Date of Birth/Sex: Treating RN: November 30, 1965 (57 y.o. 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 J44.9 Chronic obstructive pulmonary disease, unspecified M06.9 Rheumatoid arthritis, unspecified Z87.891 Personal history of nicotine dependence Facility Procedures CPT4 Code Description Modifier Quantity [id number] G0277-(Facility Use Only) HBOT full body chamber, , 4 ICD-10 Diagnosis Description 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 Physician Procedures Quantity CPT4 Code Description Modifier [id number] 99183 - WC PHYS HYPERBARIC OXYGEN THERAPY 1 ICD-10 Diagnosis Description 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

## 2023-04-24 NOTE — Progress Notes (Addendum)
BRECKIN, BOGUE ([id number]) [id number] Page 1 of 2 Visit Report for 04/24/2023 Arrival Information Details Patient Name: Date of Service: [place], [name] 04/24/2023 7:30 A M Medical Record Number: [id number] Patient Account Number: [id number] Date of Birth/Sex: Treating RN: Dec 07, 1965 (57 y.o. Bayard Hugger, Bonita Quin Primary Care Jodeci Rini: [name] Other Clinician: [name] Referring Eugina Row: Treating Keirsten Matuska/Extender: Marena Chancy in Treatment: 17 Visit Information History Since Last Visit All ordered tests and consults were completed: Yes Patient Arrived: Ambulatory Added or deleted any medications: No Arrival Time: 07:38 Any new allergies or adverse reactions: No Accompanied By: self Had a fall or experienced change in No Transfer Assistance: None activities of daily living that may affect Patient Identification Verified: Yes risk of falls: Secondary Verification Process Completed: Yes Signs or symptoms of abuse/neglect since last visito No Patient Requires Transmission-Based Precautions: No Hospitalized since last visit: No Patient Has Alerts: No Implantable device outside of the clinic excluding No cellular tissue based products placed in the center since last visit: Pain Present Now: No Electronic Signature(s) Signed: 04/24/2023 11:23:51 AM By: [name] CHT EMT BS , , Entered By: [name] on 04/24/2023 08:23:50 -------------------------------------------------------------------------------- Encounter Discharge Information Details Patient Name: Date of Service: [name], [name] 04/24/2023 7:30 A M Medical Record Number: [id number] Patient Account Number: [id number] Date of Birth/Sex: Treating RN: [birth year] (57 y.o. [name] Primary Care Tayllor Breitenstein: [name] Other Clinician: [name] Referring Jayro Mcmath: Treating Prince Olivier/Extender: Marena Chancy in Treatment: 17 Encounter Discharge Information Items Discharge Condition: Stable Ambulatory Status: Ambulatory Discharge Destination: Home Transportation: Private Auto Accompanied By: self Schedule Follow-up Appointment: No Clinical Summary of Care: Electronic Signature(s) Signed: 04/24/2023 12:12:37 PM By: [name] CHT EMT BS , , Entered By: [name] on 04/24/2023 09:12:37 [name] ([id number]) [id number] Page 2 of 2 -------------------------------------------------------------------------------- Vitals Details Patient Name: Date of Service: [place], [name] 04/24/2023 7:30 A M Medical Record Number: [id number] Patient Account Number: [id number] Date of Birth/Sex: Treating RN: 1966/04/20 (57 y.o. [name] Primary Care Zhaniya Swallows: [name] Other Clinician: [name] Referring Maytte Jacot: Treating Dilynn Munroe/Extender: Marena Chancy in Treatment: 17 Vital Signs Time Taken: 07:50 Temperature (F): 97.9 Height (in): 68 Pulse (bpm): 77 Weight (lbs): 200 Respiratory Rate (breaths/min): 18 Body Mass Index (BMI): 30.4 Blood Pressure (mmHg): 124/53 Capillary Blood Glucose (mg/dl): 433 Reference Range: 80 - 120 mg / dl Electronic Signature(s) Signed: 04/24/2023 11:24:59 AM By: [name] CHT EMT BS , , Entered By: [name] on 04/24/2023 08:24:59

## 2023-04-24 NOTE — Progress Notes (Addendum)
KENECHI, SHEFFLER ([id number]) [id number] Page 1 of 2 Visit Report for 04/24/2023 HBO Details Patient Name: Date of Service: [place], [name] 04/24/2023 7:30 A M Medical Record Number: [id number] Patient Account Number: [id number] Date of Birth/Sex: Treating RN: [birth year] (57 y.o. [name] Primary Care Krystn Dermody: [name] Other Clinician: [name] Referring Zackory Pudlo: Treating Chrisette Man/Extender: Marena Chancy in Treatment: 17 HBO Treatment Course Details Treatment Course Number: 1 Ordering Rande Dario: Geralyn Corwin T Treatments Ordered: otal 60 HBO Treatment Start Date: 02/16/2023 HBO Indication: Other (specify in Notes) Notes: Acute peripheral arterial insufficiency HBO Treatment Details Treatment Number: 39 Patient Type: Outpatient Chamber Type: Monoplace Chamber Serial #: [id number] Treatment Protocol: 2.0 ATA with 90 minutes oxygen, with two 5 minute air breaks Treatment Details Compression Rate Down: 1.5 psi / minute De-Compression Rate Up: 2.0 psi / minute A breaks and breathing ir Compress Tx Pressure periods Decompress Decompress Begins Reached (leave unused spaces Begins Ends blank) Chamber Pressure (ATA 1 2 2 2 2 2  --2 1 ) Clock Time (24 hr) 08:03 08:15 08:46 08:51 09:20 09:25 - - 09:55 10:04 Treatment Length: 121 (minutes) Treatment Segments: 4 Vital Signs Capillary Blood Glucose Reference Range: 80 - 120 mg / dl HBO Diabetic Blood Glucose Intervention Range: <131 mg/dl or >387 mg/dl Type: Time Vitals Blood Pulse: Respiratory Temperature: Capillary Blood Glucose Pulse Action Taken: Pressure: Rate: Glucose (mg/dl): Meter #: Oximetry (%) Taken: Pre 07:50 124/53 77 18 97.9 289 asymptomatic for hyperglycemia Post 10:07 136/80 70 18 97.7 335 patient self-admin insulin. Treatment Response Treatment Toleration: Well Treatment Completion Status: Treatment Completed without Adverse  Event Treatment Notes Mr. Kozinski arrived with normal vital signs except diastolic BP. He was asymptomatic for hypotension. His blood glucose level was 289 mg/dL. He denied symptoms of hyperglycemia. Patient described his breakfast as sausage biscuite, orange juice, protein shake. He prepared for treatment. After performing a safety check, he was placed in the chamber which was compressed with 100% oxygen at a rate of 2 psi/min after confirming normal ear equalization. He tolerated the treatment and subsequent decompression of the chamber at the rate of 2 psi/min. He denied any issues with ear equalization and pain associated with barotrauma. His post treatment vital signs were within normal range except blood glucose level of 335 mg/dL. He denied symptoms of hyperglycemia. He controls glucose well and safely, and self-administered insulin prior to departure. He was stable upon discharge. Electronic Signature(s) Signed: 04/24/2023 12:11:37 PM By: [name] CHT EMT BS , , Signed: 04/24/2023 12:25:06 PM By: Duanne Guess MD FACS Previous Signature: 04/24/2023 12:03:05 PM Version By: Duanne Guess MD FACS Previous Signature: 04/24/2023 11:37:09 AM Version By: [name] CHT EMT BS , , Entered By: [name] on 04/24/2023 09:11:36 Siverling, [name] ([id number]) [id number] Page 2 of 2 -------------------------------------------------------------------------------- HBO Safety Checklist Details Patient Name: Date of Service: [place], [name] 04/24/2023 7:30 A M Medical Record Number: [id number] Patient Account Number: [id number] Date of Birth/Sex: Treating RN: Dec 04, 1965 (57 y.o. [name] Primary Care Brand Siever: [name] Other Clinician: [name] Referring Celvin Taney: Treating Tauheed Mcfayden/Extender: Marena Chancy in Treatment: 17 HBO Safety Checklist Items Safety Checklist Consent Form Signed Patient  voided / foley secured and emptied When did you last eato 0700 - Saus Biscuit, Protein Shake Last dose of injectable or oral agent 0630 - 17 Units Lantus Ostomy pouch emptied and vented if applicable NA All implantable devices assessed, documented and approved Freestyle Libre 2 and3 on approved  list Intravenous access site secured and place NA Valuables secured Linens and cotton and cotton/polyester blend (less than 51% polyester) Personal oil-based products / skin lotions / body lotions removed Wigs or hairpieces removed NA Smoking or tobacco materials removed NA Books / newspapers / magazines / loose paper removed Cologne, aftershave, perfume and deodorant removed Jewelry removed (may wrap wedding band) Make-up removed NA Hair care products removed Libre Freestyle 2 and 3 Sensor Approved, other Battery operated devices (external) removed electronics removed. Heating patches and chemical warmers removed Titanium eyewear removed Nail polish cured greater than 10 hours NA Casting material cured greater than 10 hours NA Hearing aids removed NA Loose dentures or partials removed NA Prosthetics have been removed NA Patient demonstrates correct use of air break device (if applicable) Patient concerns have been addressed Patient grounding bracelet on and cord attached to chamber Specifics for Inpatients (complete in addition to above) Medication sheet sent with patient NA Intravenous medications needed or due during therapy sent with patient NA Drainage tubes (e.g. nasogastric tube or chest tube secured and vented) NA Endotracheal or Tracheotomy tube secured NA Cuff deflated of air and inflated with saline NA Airway suctioned NA Notes Paper version used prior to treatment start. Electronic Signature(s) Signed: 04/24/2023 11:32:00 AM By: [name] CHT EMT BS , , Entered By: [name] on 04/24/2023 08:32:00

## 2023-04-27 ENCOUNTER — Encounter (HOSPITAL_BASED_OUTPATIENT_CLINIC_OR_DEPARTMENT_OTHER): Payer: 59 | Admitting: Internal Medicine

## 2023-04-27 DIAGNOSIS — E10621 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: Secondary | ICD-10-CM | POA: Diagnosis not present

## 2023-04-27 LAB — GLUCOSE, CAPILLARY
Glucose-Capillary: 170 mg/dL — ABNORMAL HIGH (ref 70–99)
Glucose-Capillary: 91 mg/dL (ref 70–99)

## 2023-04-27 NOTE — Progress Notes (Signed)
SHADD, LEGNON ([id number]) [id number] Page 1 of 2 Visit Report for 04/27/2023 Problem List Details Patient Name: Date of Service: [name], [name] 04/27/2023 7:30 A M Medical Record Number: [id number] Patient Account Number: [id number] Date of Birth/Sex: Treating RN: Jun 24, 1966 (57 y.o. 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 04/27/2023 Medical Record Number: [id number] Patient Account Number: [id number] Date of Birth/Sex: Treating RN: 04/13/1966 (57 y.o. 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 J44.9 Chronic obstructive pulmonary disease, unspecified M06.9 Rheumatoid arthritis, unspecified Z87.891 Personal history of nicotine dependence Facility Procedures : CPT4 Code Description: [id number] G0277-(Facility Use Only) HBOT full body chamber, , ICD-10 Diagnosis Description I70.245 Atherosclerosis of native arteries of left leg with ulceratio L97.528 Non-pressure chronic ulcer of other part of left foot  with ot 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 Modifier: n of other part o her specified sev Quantity: 4 f foot erity Physician Procedures : CPT4 Code Description Modifier [id number] - WC PHYS HYPERBARIC OXYGEN THERAPY ICD-10 Diagnosis Description I70.245 Atherosclerosis of native arteries of left leg with ulceration of other part o L97.528 Non-pressure chronic ulcer of other part of  left foot with other specified sev 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

## 2023-04-27 NOTE — Progress Notes (Addendum)
MOUHAMAD, AZZARELLO ([id number]) [id number] Page 1 of 2 Visit Report for 04/27/2023 HBO Details Patient Name: Date of Service: [place], [name] 04/27/2023 7:30 A M Medical Record Number: [id number] Patient Account Number: [id number] Date of Birth/Sex: Treating RN: [birth year] (57 y.o. [name] Primary Care Amyiah Gaba: [name] Other Clinician: Karl Bales Referring Sachiko Methot: Treating Anaira Seay/Extender: Theodis Sato in Treatment: 17 HBO Treatment Course Details Treatment Course Number: 1 Ordering Mashayla Lavin: Geralyn Corwin T Treatments Ordered: otal 60 HBO Treatment Start Date: 02/16/2023 HBO Indication: Other (specify in Notes) Notes: Acute peripheral arterial insufficiency HBO Treatment Details Treatment Number: 40 Patient Type: Outpatient Chamber Type: Monoplace Chamber Serial #: [id number] Treatment Protocol: 2.0 ATA with 90 minutes oxygen, with two 5 minute air breaks Treatment Details Compression Rate Down: 2.0 psi / minute De-Compression Rate Up: 2.0 psi / minute A breaks and breathing ir Compress Tx Pressure periods Decompress Decompress Begins Reached (leave unused spaces Begins Ends blank) Chamber Pressure (ATA 1 2 2 2 2 2  --2 1 ) Clock Time (24 hr) 08:00 08:10 08:40 08:45 09:15 09:20 - - 09:50 09:58 Treatment Length: 118 (minutes) Treatment Segments: 4 Vital Signs Capillary Blood Glucose Reference Range: 80 - 120 mg / dl HBO Diabetic Blood Glucose Intervention Range: <131 mg/dl or >308 mg/dl Type: Time Vitals Blood Pulse: Respiratory Temperature: Capillary Blood Glucose Pulse Action Taken: Pressure: Rate: Glucose (mg/dl): Meter #: Oximetry (%) Taken: Pre 07:37 130/61 70 18 97.9 170 Post 10:04 125/65 67 18 97.9 91 Patient given Glucerna. Waited for after. Treatment Response Treatment Toleration: Well Treatment Completion Status: Treatment Completed without Adverse Event Treatment Notes The  patient post treatment blood sugar was 91. The patient was given 8 oz of Glucerna to drink, changed cloths and waited 15 min before leaving. As the patient was leaving I asked him how he felt. He stated he felt fine. Electronic Signature(s) Signed: 04/27/2023 1:06:19 PM By: Karl Bales EMT Signed: 04/27/2023 3:56:56 PM By: [name] MD Entered By: Karl Bales on 04/27/2023 10:06:19 -------------------------------------------------------------------------------- HBO Safety Checklist Details Patient Name: Date of Service: [name], [name] 04/27/2023 7:30 A Wynn Banker, [name] ([id number]) [id number] Page 2 of 2 Medical Record Number: [id number] Patient Account Number: [id number] Date of Birth/Sex: Treating RN: May 29, 1966 (57 y.o. [name] Primary Care Jahdiel Krol: [name] Other Clinician: [name] Referring Mathhew Buysse: Treating Demonta Wombles/Extender: Theodis Sato in Treatment: 17 HBO Safety Checklist Items Safety Checklist Consent Form Signed Patient voided / foley secured and emptied When did you last eato 0700 - SEC Sandwich on Toast, coffee Last dose of injectable or oral agent 0630 - 17 Units Lantus Ostomy pouch emptied and vented if applicable NA All implantable devices assessed, documented and approved Freestyle Libre 2 and3 on approved list Intravenous access site secured and place NA Valuables secured Linens and cotton and cotton/polyester blend (less than 51% polyester) Personal oil-based products / skin lotions / body lotions removed Wigs or hairpieces removed NA Smoking or tobacco materials removed NA Books / newspapers / magazines / loose paper removed Cologne, aftershave, perfume and deodorant removed Jewelry removed (may wrap wedding band) Make-up removed Hair care products removed Libre Freestyle 2 and 3 Sensor Approved, other Battery operated devices (external) removed electronics  removed. Heating patches and chemical warmers removed Titanium eyewear removed Nail polish cured greater than 10 hours Casting material cured greater than 10 hours NA Hearing aids removed NA Loose dentures or partials removed NA Prosthetics have been removed NA  Patient demonstrates correct use of air break device (if applicable) Patient concerns have been addressed Patient grounding bracelet on and cord attached to chamber Specifics for Inpatients (complete in addition to above) Medication sheet sent with patient NA Intravenous medications needed or due during therapy sent with patient NA Drainage tubes (e.g. nasogastric tube or chest tube secured and vented) NA Endotracheal or Tracheotomy tube secured NA Cuff deflated of air and inflated with saline NA Airway suctioned NA Notes Paper version used prior to treatment start. Electronic Signature(s) Signed: 04/27/2023 8:17:13 AM By: [name] CHT EMT BS , , Entered By: [name] on 04/27/2023 05:17:13

## 2023-04-27 NOTE — Progress Notes (Addendum)
URIAN, DIBERARDINO ([id number]) [id number] Page 1 of 2 Visit Report for 04/27/2023 Arrival Information Details Patient Name: Date of Service: [place], [name] 04/27/2023 7:30 A M Medical Record Number: [id number] Patient Account Number: [id number] Date of Birth/Sex: Treating RN: 07/21/1966 (57 y.o. Bayard Hugger, Bonita Quin Primary Care Shalondra Wunschel: [name] Other Clinician: Karl Bales Referring Romano Stigger: Treating Diasha Castleman/Extender: Theodis Sato in Treatment: 17 Visit Information History Since Last Visit All ordered tests and consults were completed: Yes Patient Arrived: Ambulatory Added or deleted any medications: No Arrival Time: 07:33 Any new allergies or adverse reactions: No Accompanied By: self Had a fall or experienced change in No Transfer Assistance: None activities of daily living that may affect Patient Identification Verified: Yes risk of falls: Secondary Verification Process Completed: Yes Signs or symptoms of abuse/neglect since last visito No Patient Requires Transmission-Based Precautions: No Hospitalized since last visit: No Patient Has Alerts: No Implantable device outside of the clinic excluding No cellular tissue based products placed in the center since last visit: Pain Present Now: No Electronic Signature(s) Signed: 04/27/2023 8:14:15 AM By: [name] CHT EMT BS , , Entered By: [name] on 04/27/2023 08:14:14 -------------------------------------------------------------------------------- Encounter Discharge Information Details Patient Name: Date of Service: [name], [name] 04/27/2023 7:30 A M Medical Record Number: [id number] Patient Account Number: [id number] Date of Birth/Sex: Treating RN: 1966/05/10 (57 y.o. [name] Primary Care Tamiyah Moulin: [name] Other Clinician: Karl Bales Referring Feleshia Zundel: Treating Jaquaveon Bilal/Extender: Theodis Sato in Treatment: 17 Encounter Discharge Information Items Discharge Condition: Stable Ambulatory Status: Ambulatory Discharge Destination: Home Transportation: Private Auto Accompanied By: None Schedule Follow-up Appointment: Yes Clinical Summary of Care: Electronic Signature(s) Signed: 04/27/2023 1:12:00 PM By: Karl Bales EMT Entered By: Karl Bales on 04/27/2023 13:12:00 [name] ([id number]) [id number] Page 2 of 2 -------------------------------------------------------------------------------- Vitals Details Patient Name: Date of Service: BARSAM[place], [name] 04/27/2023 7:30 A M Medical Record Number: [id number] Patient Account Number: [id number] Date of Birth/Sex: Treating RN: [birth year] (57 y.o. [name] Primary Care Adea Geisel: [name] Other Clinician: [name] Referring Jahaan Vanwagner: Treating Shawanda Sievert/Extender: Theodis Sato in Treatment: 17 Vital Signs Time Taken: 07:37 Temperature (F): 97.9 Height (in): 68 Pulse (bpm): 70 Weight (lbs): 200 Respiratory Rate (breaths/min): 18 Body Mass Index (BMI): 30.4 Blood Pressure (mmHg): 130/61 Capillary Blood Glucose (mg/dl): 433 Reference Range: 80 - 120 mg / dl Electronic Signature(s) Signed: 04/27/2023 8:15:09 AM By: [name] CHT EMT BS , , Entered By: [name] on 04/27/2023 08:15:09

## 2023-04-28 ENCOUNTER — Encounter (HOSPITAL_BASED_OUTPATIENT_CLINIC_OR_DEPARTMENT_OTHER): Payer: 59 | Admitting: Internal Medicine

## 2023-04-28 DIAGNOSIS — E10621 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: Secondary | ICD-10-CM | POA: Diagnosis not present

## 2023-04-28 LAB — GLUCOSE, CAPILLARY
Glucose-Capillary: 169 mg/dL — ABNORMAL HIGH (ref 70–99)
Glucose-Capillary: 242 mg/dL — ABNORMAL HIGH (ref 70–99)

## 2023-04-28 NOTE — Progress Notes (Addendum)
PRESLEE, [name] ([id number]) [id number] Page 1 of 2 Visit Report for 04/28/2023 HBO Details Patient Name: Date of Service: [place], [name] 04/28/2023 7:30 A M Medical Record Number: [id number] Patient Account Number: [id number] Date of Birth/Sex: Treating RN: [birth year] (57 y.o. [name], [name] Primary Care Stanislaw Acton: [name] Other Clinician: [name] Referring Anuradha Chabot: Treating Kamillah Didonato/Extender: Theodis Sato in Treatment: 17 HBO Treatment Course Details Treatment Course Number: 1 Ordering Marlaysia Lenig: Geralyn Corwin T Treatments Ordered: otal 60 HBO Treatment Start Date: 02/16/2023 HBO Indication: Other (specify in Notes) Notes: Acute peripheral arterial insufficiency HBO Treatment Details Treatment Number: 41 Patient Type: Outpatient Chamber Type: Monoplace Chamber Serial #: [id number] Treatment Protocol: 2.0 ATA with 90 minutes oxygen, with two 5 minute air breaks Treatment Details Compression Rate Down: 1.5 psi / minute De-Compression Rate Up: 2.0 psi / minute A breaks and breathing ir Compress Tx Pressure periods Decompress Decompress Begins Reached (leave unused spaces Begins Ends blank) Chamber Pressure (ATA 1 2 2 2 2 2  --2 1 ) Clock Time (24 hr) 08:14 08:23 08:53 08:58 09:30 09:35 - - 10:05 10:13 Treatment Length: 119 (minutes) Treatment Segments: 4 Vital Signs Capillary Blood Glucose Reference Range: 80 - 120 mg / dl HBO Diabetic Blood Glucose Intervention Range: <131 mg/dl or >952 mg/dl Type: Time Vitals Blood Respiratory Capillary Blood Glucose Pulse Action Pulse: Temperature: Taken: Pressure: Rate: Glucose (mg/dl): Meter #: Oximetry (%) Taken: Pre 08:06 133/63 73 18 97.3 169 none per protocol Post 10:16 126/63 70 18 97.7 242 none per protocol Treatment Response Treatment Toleration: Well Treatment Completion Status: Treatment Completed without Adverse Event Treatment Notes Mr. Stockdale  arrived with normal vital signs. His blood glucose level was 169 mg/dL. Patient described his breakfast as sausage biscuit, orange juice, protein shake. He prepared for treatment. After performing a safety check, he was placed in the chamber which was compressed with 100% oxygen at a rate of 2 psi/min after confirming normal ear equalization. He tolerated the treatment and subsequent decompression of the chamber at the rate of 2 psi/min. He denied any issues with ear equalization and pain associated with barotrauma. His post treatment vital signs were within normal range with blood glucose level of 242 mg/dL. He was stable upon discharge. Orby Tangen Notes No concerns with treatment given Physician HBO Attestation: I certify that I supervised this HBO treatment in accordance with Medicare guidelines. A trained emergency response team is readily available per Yes hospital policies and procedures. Continue HBOT as ordered. Yes Electronic Signature(s) Signed: 04/29/2023 12:00:57 PM By: [name] CHT EMT BS , , Signed: 05/01/2023 9:31:55 AM By: [name] MD Previous Signature: 04/28/2023 4:52:49 PM Version By: [name] MD Previous Signature: 04/28/2023 3:49:33 PM Version By: [name] CHT EMT BS , , [name], [name] ([id number]) [id number] Page 2 of 2 Previous Signature: 04/28/2023 3:49:33 PM Version By: [name] CHT EMT BS , , Entered By: [name] on 04/29/2023 09:00:57 -------------------------------------------------------------------------------- HBO Safety Checklist Details Patient Name: Date of Service: [place], [name] 04/28/2023 7:30 A M Medical Record Number: [id number] Patient Account Number: [id number] Date of Birth/Sex: Treating RN: 1966-04-27 (57 y.o. [name] Primary Care Oluwafemi Villella: [name] Other Clinician: [name] Referring Abdirizak Richison: Treating Michaelpaul Apo/Extender: Theodis Sato in Treatment: 17 HBO Safety Checklist Items Safety Checklist Consent Form Signed Patient voided / foley secured and emptied When did you last eato 0700 Sau Biscuit, OJ, Protein Shake Last dose of injectable or oral agent 0630 17  units Lantus Ostomy pouch emptied and vented if applicable NA All implantable devices assessed, documented and approved NA Intravenous access site secured and place NA Valuables secured Linens and cotton and cotton/polyester blend (less than 51% polyester) Personal oil-based products / skin lotions / body lotions removed Wigs or hairpieces removed NA Smoking or tobacco materials removed NA Books / newspapers / magazines / loose paper removed Cologne, aftershave, perfume and deodorant removed Jewelry removed (may wrap wedding band) Make-up removed Hair care products removed Battery operated devices (external) removed Heating patches and chemical warmers removed Titanium eyewear removed Nail polish cured greater than 10 hours NA Casting material cured greater than 10 hours NA Hearing aids removed NA Loose dentures or partials removed NA Prosthetics have been removed NA Patient demonstrates correct use of air break device (if applicable) Patient concerns have been addressed Patient grounding bracelet on and cord attached to chamber Specifics for Inpatients (complete in addition to above) Medication sheet sent with patient NA Intravenous medications needed or due during therapy sent with patient NA Drainage tubes (e.g. nasogastric tube or chest tube secured and vented) NA Endotracheal or Tracheotomy tube secured NA Cuff deflated of air and inflated with saline NA Airway suctioned NA Notes Paper version used prior to treatment start. Electronic Signature(s) Signed: 04/28/2023 3:40:38 PM By: [name] CHT EMT BS , , Entered By: [name] on 04/28/2023 12:40:38

## 2023-04-28 NOTE — Progress Notes (Signed)
[name], [name] ([id number]) [id number] Page 1 of 2 Visit Report for 04/28/2023 Arrival Information Details Patient Name: Date of Service: [name], [name] 04/28/2023 7:30 A M Medical Record Number: [id number] Patient Account Number: [id number] Date of Birth/Sex: Treating RN: [birth year] (57 y.o. [name], [name] Primary Care Colene Mines: [name] Other Clinician: [name] Referring Kaisa Wofford: Treating Kynzlee Hucker/Extender: Theodis Sato in Treatment: 17 Visit Information History Since Last Visit All ordered tests and consults were completed: Yes Patient Arrived: Ambulatory Added or deleted any medications: No Arrival Time: 07:43 Any new allergies or adverse reactions: No Accompanied By: self Had a fall or experienced change in No Transfer Assistance: None activities of daily living that may affect Patient Identification Verified: Yes risk of falls: Secondary Verification Process Completed: Yes Signs or symptoms of abuse/neglect since last visito No Patient Requires Transmission-Based Precautions: No Hospitalized since last visit: No Patient Has Alerts: No Implantable device outside of the clinic excluding No cellular tissue based products placed in the center since last visit: Pain Present Now: No Electronic Signature(s) Signed: 04/28/2023 3:36:58 PM By: [name] CHT EMT BS , , Entered By: [name] on 04/28/2023 12:36:58 -------------------------------------------------------------------------------- Encounter Discharge Information Details Patient Name: Date of Service: [name], [name] [name] J. 04/28/2023 7:30 A M Medical Record Number: [id number] Patient Account Number: [id number] Date of Birth/Sex: Treating RN: 1966/04/03 (57 y.o. [name] Primary Care Marrell Dicaprio: [name] Other Clinician: [name] Referring Anubis Fundora: Treating Junie Avilla/Extender: Theodis Sato in Treatment: 17 Encounter Discharge Information Items Discharge Condition: Stable Ambulatory Status: Ambulatory Discharge Destination: Home Transportation: Private Auto Accompanied By: self Schedule Follow-up Appointment: No Clinical Summary of Care: Electronic Signature(s) Signed: 04/28/2023 3:52:22 PM By: [name] CHT EMT BS , , Entered By: [name] on 04/28/2023 12:52:22 [name] ([id number]) [id number] Page 2 of 2 -------------------------------------------------------------------------------- Vitals Details Patient Name: Date of Service: [name], [name] 04/28/2023 7:30 A M Medical Record Number: [id number] Patient Account Number: [id number] Date of Birth/Sex: Treating RN: 10/01/1965 (57 y.o. [name], [name] Primary Care Maysun Meditz: [name] Other Clinician: [name] Referring Dani Danis: Treating Tayelor Osborne/Extender: Theodis Sato in Treatment: 17 Vital Signs Time Taken: 08:06 Temperature (F): 97.3 Height (in): 68 Pulse (bpm): 73 Weight (lbs): 200 Respiratory Rate (breaths/min): 18 Body Mass Index (BMI): 30.4 Blood Pressure (mmHg): 133/63 Capillary Blood Glucose (mg/dl): 433 Reference Range: 80 - 120 mg / dl Electronic Signature(s) Signed: 04/28/2023 3:37:41 PM By: [name] CHT EMT BS , , Entered By: [name] on 04/28/2023 12:37:41

## 2023-04-29 ENCOUNTER — Encounter (HOSPITAL_BASED_OUTPATIENT_CLINIC_OR_DEPARTMENT_OTHER): Payer: 59 | Admitting: Physician Assistant

## 2023-04-29 DIAGNOSIS — E10621 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: Secondary | ICD-10-CM | POA: Diagnosis not present

## 2023-04-29 LAB — GLUCOSE, CAPILLARY
Glucose-Capillary: 214 mg/dL — ABNORMAL HIGH (ref 70–99)
Glucose-Capillary: 233 mg/dL — ABNORMAL HIGH (ref 70–99)

## 2023-04-29 NOTE — Progress Notes (Addendum)
[name], [name] ([id number]) [id number] Page 1 of 2 Visit Report for 04/29/2023 HBO Details Patient Name: Date of Service: [name] [name] 04/29/2023 7:30 A M Medical Record Number: [id number] Patient Account Number: [id number] Date of Birth/Sex: Treating RN: 09/12/1965 (57 y.o. [name] [name] Primary Care [name] [name]: [name] [name] Other Clinician: Haywood [name] Referring [name] [name]: Treating [name] [name]: [name] in Treatment: 17 HBO Treatment Course Details Treatment Course Number: 1 Ordering [name] [name]: [name] [name] T Treatments Ordered: otal 60 HBO Treatment Start Date: 02/16/2023 HBO Indication: Other (specify in Notes) Notes: Acute peripheral arterial insufficiency HBO Treatment Details Treatment Number: 42 Patient Type: Outpatient Chamber Type: Monoplace Chamber Serial #: [id number] Treatment Protocol: 2.0 ATA with 90 minutes oxygen, with two 5 minute air breaks Treatment Details Compression Rate Down: 1.5 psi / minute De-Compression Rate Up: 2.0 psi / minute A breaks and breathing ir Compress Tx Pressure periods Decompress Decompress Begins Reached (leave unused spaces Begins Ends blank) Chamber Pressure (ATA 1 2 2 2 2 2  --2 1 ) Clock Time (24 hr) 08:11 08:21 08:51 08:56 09:26 09:31 - - 10:01 10:10 Treatment Length: 119 (minutes) Treatment Segments: 4 Vital Signs Capillary Blood Glucose Reference Range: 80 - 120 mg / dl HBO Diabetic Blood Glucose Intervention Range: <131 mg/dl or >366 mg/dl Type: Time Vitals Blood Respiratory Capillary Blood Glucose Pulse Action Pulse: Temperature: Taken: Pressure: Rate: Glucose (mg/dl): Meter #: Oximetry (%) Taken: Pre 07:49 137/67 75 18 97.8 214 none per protocol Post 10:15 136/65 72 18 97.3 233 none per protocol Treatment Response Treatment Toleration: Well Treatment Completion Status: Treatment Completed without Adverse Event Treatment Notes [name] arrived with normal vital signs. His blood glucose level was 214 mg/dL. Patient described his breakfast as egg and cheese sandwich, orange juice, protein shake, and coffee. He prepared for treatment. After performing a safety check, he was placed in the chamber which was compressed with 100% oxygen at a rate of 2 psi/min after confirming normal ear equalization. He tolerated the treatment and subsequent decompression of the chamber at the rate of 2 psi/min. He denied any issues with ear equalization and pain associated with barotrauma. His post treatment vital signs were within normal range with a blood glucose level of 233 mg/dL. He was stable upon discharge. Electronic Signature(s) Signed: 04/29/2023 11:49:34 AM By: [name] [name] CHT EMT BS , , Signed: 04/29/2023 3:53:17 PM By: Allen Derry PA-C Entered By: [name] [name] on 04/29/2023 11:49:33 [name] [name] Garter ([id number]) [id number] Page 2 of 2 -------------------------------------------------------------------------------- HBO Safety Checklist Details Patient Name: Date of Service: [name] [name] 04/29/2023 7:30 A M Medical Record Number: [id number] Patient Account Number: [id number] Date of Birth/Sex: Treating RN: 06-13-1966 (57 y.o. [name] [name] Primary Care [name] [name]: [name] [name] Other Clinician: Haywood [name] Referring Sammantha Mehlhaff: Treating Tova Vater/Extender: [name] in Treatment: 17 HBO Safety Checklist Items Safety Checklist Consent Form Signed Patient voided / foley secured and emptied When did you last eato 0700 - Egg and Cheese Sandwich, Protein Shake Last dose of injectable or oral agent 0630 - 17 units Lantus Ostomy pouch emptied and vented if applicable NA All implantable devices assessed, documented and approved Freestyle Libre 2 and3 on approved list Intravenous access site secured and place NA Valuables secured NA Linens and  cotton and cotton/polyester blend (less than 51% polyester) Personal oil-based products / skin lotions / body lotions removed Wigs or hairpieces removed Smoking or tobacco materials removed Books / newspapers / magazines / loose  paper removed Cologne, aftershave, perfume and deodorant removed Jewelry removed (may wrap wedding band) Make-up removed NA Hair care products removed Battery operated devices (external) removed Heating patches and chemical warmers removed Titanium eyewear removed Nail polish cured greater than 10 hours NA Casting material cured greater than 10 hours NA Hearing aids removed NA Loose dentures or partials removed NA Prosthetics have been removed NA Patient demonstrates correct use of air break device (if applicable) Patient concerns have been addressed Patient grounding bracelet on and cord attached to chamber Specifics for Inpatients (complete in addition to above) Medication sheet sent with patient NA Intravenous medications needed or due during therapy sent with patient NA Drainage tubes (e.g. nasogastric tube or chest tube secured and vented) NA Endotracheal or Tracheotomy tube secured NA Cuff deflated of air and inflated with saline NA Airway suctioned NA Notes Paper version used prior to treatment start. Electronic Signature(s) Signed: 04/29/2023 11:46:29 AM By: [name] [name] CHT EMT BS , , Entered By: [name] [name] on 04/29/2023 11:46:29

## 2023-04-29 NOTE — Progress Notes (Signed)
DARIAN, PEPIN ([id number]) [id number] Page 1 of 1 Visit Report for 04/29/2023 SuperBill Details Patient Name: Date of Service: [name], [name] 04/29/2023 Medical Record Number: [id number] Patient Account Number: [id number] Date of Birth/Sex: Treating RN: 04/16/1966 (57 y.o. 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 J44.9 Chronic obstructive pulmonary disease, unspecified M06.9 Rheumatoid arthritis, unspecified Z87.891 Personal history of nicotine dependence Facility Procedures CPT4 Code Description Modifier Quantity [id number] G0277-(Facility Use Only) HBOT full body chamber, , 4 ICD-10 Diagnosis Description 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

## 2023-04-29 NOTE — Progress Notes (Signed)
[name], [name] ([id number]) [id number] Page 1 of 2 Visit Report for 04/29/2023 Arrival Information Details Patient Name: Date of Service: [name], [name] 04/29/2023 7:30 A M Medical Record Number: [id number] Patient Account Number: [id number] Date of Birth/Sex: Treating RN: Dec 13, 1965 (57 y.o. [name] Primary Care [name]: [name] Other Clinician: Haywood [name] Referring [name]: Treating [name]/Extender: [name] in Treatment: 17 Visit Information History Since Last Visit All ordered tests and consults were completed: Yes Patient Arrived: Ambulatory Added or deleted any medications: No Arrival Time: 07:40 Any new allergies or adverse reactions: No Accompanied By: self Had a fall or experienced change in No Transfer Assistance: None activities of daily living that may affect Patient Identification Verified: Yes risk of falls: Secondary Verification Process Completed: Yes Signs or symptoms of abuse/neglect since last visito No Patient Requires Transmission-Based Precautions: No Hospitalized since last visit: No Patient Has Alerts: No Implantable device outside of the clinic excluding No cellular tissue based products placed in the center since last visit: Pain Present Now: No Electronic Signature(s) Signed: 04/29/2023 11:44:23 AM By: [name] CHT EMT BS , , Entered By: [name] on 04/29/2023 08:44:23 -------------------------------------------------------------------------------- Encounter Discharge Information Details Patient Name: Date of Service: [name] Pont, [name] 04/29/2023 7:30 A M Medical Record Number: [id number] Patient Account Number: [id number] Date of Birth/Sex: Treating RN: 07/27/1966 (57 y.o. [name] Primary Care [name]: [name] Other Clinician: Haywood [name] Referring [name]: Treating [name]/Extender: [name] in Treatment: 17 Encounter Discharge Information Items Discharge Condition: Stable Ambulatory Status: Ambulatory Discharge Destination: Home Transportation: Private Auto Accompanied By: self Schedule Follow-up Appointment: No Clinical Summary of Care: Electronic Signature(s) Signed: 04/29/2023 11:50:38 AM By: [name] CHT EMT BS , , Entered By: [name] on 04/29/2023 08:50:38 [name] ([id number]) [id number] Page 2 of 2 -------------------------------------------------------------------------------- Vitals Details Patient Name: Date of Service: [name] 04/29/2023 7:30 A M Medical Record Number: [id number] Patient Account Number: [id number] Date of Birth/Sex: Treating RN: 1966-05-17 (57 y.o. [name] Primary Care [name]: [name] Other Clinician: Haywood [name] Referring [name]: Treating [name]/Extender: [name] in Treatment: 17 Vital Signs Time Taken: 07:49 Temperature (F): 97.8 Height (in): 68 Pulse (bpm): 75 Weight (lbs): 200 Respiratory Rate (breaths/min): 18 Body Mass Index (BMI): 30.4 Blood Pressure (mmHg): 137/67 Capillary Blood Glucose (mg/dl): 235 Reference Range: 80 - 120 mg / dl Electronic Signature(s) Signed: 04/29/2023 11:44:51 AM By: [name] CHT EMT BS , , Entered By: [name] on 04/29/2023 08:44:51

## 2023-04-29 NOTE — Progress Notes (Signed)
AERION, VOLKER ([id number]) [id number] Page 1 of 1 Visit Report for 04/28/2023 SuperBill Details Patient Name: Date of Service: [name], [name] 04/28/2023 Medical Record Number: [id number] Patient Account Number: [id number] Date of Birth/Sex: Treating RN: 03/13/1966 (57 y.o. 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 J44.9 Chronic obstructive pulmonary disease, unspecified M06.9 Rheumatoid arthritis, unspecified Z87.891 Personal history of nicotine dependence Facility Procedures CPT4 Code Description Modifier Quantity [id number] G0277-(Facility Use Only) HBOT full body chamber, , 4 ICD-10 Diagnosis Description 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

## 2023-04-30 ENCOUNTER — Encounter (HOSPITAL_BASED_OUTPATIENT_CLINIC_OR_DEPARTMENT_OTHER): Payer: 59 | Admitting: Internal Medicine

## 2023-04-30 ENCOUNTER — Ambulatory Visit: Payer: 59 | Admitting: Podiatry

## 2023-04-30 DIAGNOSIS — E1042 Type 1 diabetes mellitus with diabetic polyneuropathy: Secondary | ICD-10-CM

## 2023-04-30 DIAGNOSIS — M79676 Pain in unspecified toe(s): Secondary | ICD-10-CM

## 2023-04-30 DIAGNOSIS — B351 Tinea unguium: Secondary | ICD-10-CM

## 2023-04-30 DIAGNOSIS — E10621 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: Secondary | ICD-10-CM | POA: Diagnosis not present

## 2023-04-30 LAB — GLUCOSE, CAPILLARY
Glucose-Capillary: 190 mg/dL — ABNORMAL HIGH (ref 70–99)
Glucose-Capillary: 104 mg/dL — ABNORMAL HIGH (ref 70–99)

## 2023-04-30 NOTE — Progress Notes (Signed)
[name] presents today for a chief complaint of painful elongated toenails.  His left foot has been surgically debrided by Dr. Aldean Baker along the Achilles.  He states that he is grafting it every week with fish scales and he is also taking bariatric dives daily.  Objective: Pulses are palpable but barely.  Capillary fill time is sluggish.  Wound is wrapped to the posterior aspect with graft material so I did not unwrap it today.  He has good range of motion of the foot barely limps on it at all.  Toenails are long thick yellow dystrophic onychomycotic.  Assessment: Pain in limb secondary to onychomycosis.  Plan: Debridement of toenails 1 through 5 bilateral

## 2023-04-30 NOTE — Progress Notes (Signed)
[name], [name] ([id number]) [id number] Page 1 of 2 Visit Report for 04/30/2023 Arrival Information Details Patient Name: Date of Service: [name], [name] 04/30/2023 7:30 A M Medical Record Number: [id number] Patient Account Number: [id number] Date of Birth/Sex: Treating RN: Dec 16, 1965 (57 y.o. [name], [name] Primary Care Jaleigha Deane: [name] Other Clinician: Karl Bales Referring Indyah Saulnier: Treating Rowe Warman/Extender: Theodis Sato in Treatment: 18 Visit Information History Since Last Visit All ordered tests and consults were completed: Yes Patient Arrived: Ambulatory Added or deleted any medications: No Arrival Time: 07:39 Any new allergies or adverse reactions: No Accompanied By: None Had a fall or experienced change in No Transfer Assistance: None activities of daily living that may affect Patient Identification Verified: Yes risk of falls: Secondary Verification Process Completed: Yes Signs or symptoms of abuse/neglect since last visito No Patient Requires Transmission-Based Precautions: No Hospitalized since last visit: No Patient Has Alerts: No Implantable device outside of the clinic excluding No cellular tissue based products placed in the center since last visit: Pain Present Now: No Electronic Signature(s) Signed: 04/30/2023 1:27:54 PM By: Karl Bales EMT Entered By: Karl Bales on 04/30/2023 13:27:54 -------------------------------------------------------------------------------- Encounter Discharge Information Details Patient Name: Date of Service: [name], [name]. 04/30/2023 7:30 A M Medical Record Number: [id number] Patient Account Number: [id number] Date of Birth/Sex: Treating RN: [birth year] (57 y.o. [name] Primary Care Tyshawna Alarid: [name] Other Clinician: Karl Bales Referring Adilynne Fitzwater: Treating Lamara Brecht/Extender: Theodis Sato in  Treatment: 18 Encounter Discharge Information Items Discharge Condition: Stable Ambulatory Status: Ambulatory Discharge Destination: Home Transportation: Private Auto Accompanied By: None Schedule Follow-up Appointment: No Clinical Summary of Care: Electronic Signature(s) Signed: 04/30/2023 1:32:41 PM By: Karl Bales EMT Entered By: Karl Bales on 04/30/2023 13:32:41 [name] ([id number]) [id number] Page 2 of 2 -------------------------------------------------------------------------------- Vitals Details Patient Name: Date of Service: [name], [name] 04/30/2023 7:30 A M Medical Record Number: [id number] Patient Account Number: [id number] Date of Birth/Sex: Treating RN: Apr 11, 1966 (57 y.o. [name], [name] Primary Care Rik Wadel: [name] Other Clinician: Karl Bales Referring Barney Gertsch: Treating Sosha Shepherd/Extender: Theodis Sato in Treatment: 18 Vital Signs Time Taken: 07:47 Temperature (F): 97.2 Height (in): 68 Pulse (bpm): 76 Weight (lbs): 200 Respiratory Rate (breaths/min): 18 Body Mass Index (BMI): 30.4 Blood Pressure (mmHg): 136/79 Capillary Blood Glucose (mg/dl): 643 Reference Range: 80 - 120 mg / dl Electronic Signature(s) Signed: 04/30/2023 1:28:37 PM By: Karl Bales EMT Entered By: Karl Bales on 04/30/2023 13:28:37

## 2023-04-30 NOTE — Progress Notes (Addendum)
DERRIEN, GADZINSKI ([id number]) [id number] Page 1 of 2 Visit Report for 04/30/2023 HBO Details Patient Name: Date of Service: [name], [name] 04/30/2023 7:30 A M Medical Record Number: [id number] Patient Account Number: [id number] Date of Birth/Sex: Treating RN: 1966-03-30 (57 y.o. [name], [name] Primary Care Champayne Kocian: [name] Other Clinician: Karl Bales Referring Mariea Mcmartin: Treating Brookelyn Gaynor/Extender: Theodis Sato in Treatment: 18 HBO Treatment Course Details Treatment Course Number: 1 Ordering Camia Dipinto: Geralyn Corwin T Treatments Ordered: otal 60 HBO Treatment Start Date: 02/16/2023 HBO Indication: Other (specify in Notes) Notes: Acute peripheral arterial insufficiency HBO Treatment Details Treatment Number: 43 Patient Type: Outpatient Chamber Type: Monoplace Chamber Serial #: [id number] Treatment Protocol: 2.0 ATA with 90 minutes oxygen, with two 5 minute air breaks Treatment Details Compression Rate Down: 2.0 psi / minute De-Compression Rate Up: 2.0 psi / minute A breaks and breathing ir Compress Tx Pressure periods Decompress Decompress Begins Reached (leave unused spaces Begins Ends blank) Chamber Pressure (ATA 1 2 2 2 2 2  --2 1 ) Clock Time (24 hr) 08:02 09:11 08:41 08:46 09:16 09:21 - - 09:51 10:00 Treatment Length: 118 (minutes) Treatment Segments: 4 Vital Signs Capillary Blood Glucose Reference Range: 80 - 120 mg / dl HBO Diabetic Blood Glucose Intervention Range: <131 mg/dl or >884 mg/dl Time Vitals Blood Respiratory Capillary Blood Glucose Pulse Action Type: Pulse: Temperature: Taken: Pressure: Rate: Glucose (mg/dl): Meter #: Oximetry (%) Taken: Pre 07:47 136/79 76 18 97.2 190 Post 10:03 136/73 69 18 97.2 104 Treatment Response Treatment Toleration: Well Treatment Completion Status: Treatment Completed without Adverse Event Devesh Monforte Notes Patient was also seen for wound care  review. Physician HBO Attestation: I certify that I supervised this HBO treatment in accordance with Medicare guidelines. A trained emergency response team is readily available per Yes hospital policies and procedures. Continue HBOT as ordered. Yes Electronic Signature(s) Signed: 05/01/2023 9:31:55 AM By: [name] MD Previous Signature: 04/30/2023 1:31:42 PM Version By: Karl Bales EMT Entered By: [name] on 04/30/2023 12:49:34 [name] ([id number]) [id number] Page 2 of 2 -------------------------------------------------------------------------------- HBO Safety Checklist Details Patient Name: Date of Service: BERNEY, [name] 04/30/2023 7:30 A M Medical Record Number: [id number] Patient Account Number: [id number] Date of Birth/Sex: Treating RN: March 20, 1966 (57 y.o. [name], [name] Primary Care Tijah Hane: [name] Other Clinician: Karl Bales Referring Pharoah Goggins: Treating Daziya Redmond/Extender: Theodis Sato in Treatment: 18 HBO Safety Checklist Items Safety Checklist Consent Form Signed Patient voided / foley secured and emptied When did you last eato 0600 Last dose of injectable or oral agent 0630 Ostomy pouch emptied and vented if applicable NA All implantable devices assessed, documented and approved NA Intravenous access site secured and place NA Valuables secured Linens and cotton and cotton/polyester blend (less than 51% polyester) Personal oil-based products / skin lotions / body lotions removed Wigs or hairpieces removed NA Smoking or tobacco materials removed Books / newspapers / magazines / loose paper removed Cologne, aftershave, perfume and deodorant removed Jewelry removed (may wrap wedding band) Make-up removed NA Hair care products removed Battery operated devices (external) removed Heating patches and chemical warmers removed Titanium eyewear removed NA Nail polish cured  greater than 10 hours NA Casting material cured greater than 10 hours NA Hearing aids removed NA Loose dentures or partials removed NA Prosthetics have been removed NA Patient demonstrates correct use of air break device (if applicable) Patient concerns have been addressed Patient grounding bracelet on and cord attached to chamber Specifics for Inpatients (complete  in addition to above) Medication sheet sent with patient NA Intravenous medications needed or due during therapy sent with patient NA Drainage tubes (e.g. nasogastric tube or chest tube secured and vented) NA Endotracheal or Tracheotomy tube secured NA Cuff deflated of air and inflated with saline NA Airway suctioned NA Notes The safety checklist was done before the treatment was started. Electronic Signature(s) Signed: 04/30/2023 1:29:50 PM By: Karl Bales EMT Entered By: Karl Bales on 04/30/2023 10:29:50

## 2023-05-01 ENCOUNTER — Encounter (HOSPITAL_BASED_OUTPATIENT_CLINIC_OR_DEPARTMENT_OTHER): Payer: 59 | Admitting: General Surgery

## 2023-05-01 DIAGNOSIS — E10621 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: Secondary | ICD-10-CM | POA: Diagnosis not present

## 2023-05-01 LAB — GLUCOSE, CAPILLARY
Glucose-Capillary: 249 mg/dL — ABNORMAL HIGH (ref 70–99)
Glucose-Capillary: 195 mg/dL — ABNORMAL HIGH (ref 70–99)

## 2023-05-01 NOTE — Progress Notes (Signed)
[name], [name] ([id number]) [id number] Page 1 of 2 Visit Report for 04/30/2023 Problem List Details Patient Name: Date of Service: [name], [name] 04/30/2023 7:30 A M Medical Record Number: [id number] Patient Account Number: [id number] Date of Birth/Sex: Treating RN: 11/08/1965 (57 y.o. 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 12/25/2022 No Yes Inactive Problems ICD-10 Code Description Active Date Inactive Date J44.9 Chronic obstructive pulmonary disease, unspecified 12/25/2022 12/25/2022 M06.9 Rheumatoid arthritis, unspecified 12/25/2022 12/25/2022 G62.694 Personal history of nicotine dependence 01/27/2023 01/27/2023 Resolved Problems Electronic Signature(s) Signed: 04/30/2023 1:32:14 PM By: Karl Bales EMT Signed: 05/01/2023 9:31:55 AM By: [name] MD Entered By: Karl Bales on 04/30/2023 10:32:13 -------------------------------------------------------------------------------- SuperBill Details Patient Name: Date of Service: [name] Pont, [name] 04/30/2023 Medical Record Number: [id number] Patient Account Number: [id number] Date of Birth/Sex: Treating RN: 1965/10/01 (57 y.o. 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 Facility Procedures : CPT4 Code Description: [id number] G0277-(Facility Use Only) HBOT full body chamber, , ICD-10 Diagnosis Description I70.245 Atherosclerosis of native arteries of left leg with ulceratio L97.528 Non-pressure chronic ulcer of other part of left foot  with ot 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 Modifier: n of other part o her specified sev Quantity: 4 f foot erity Physician Procedures : CPT4 Code Description Modifier [id number] - WC PHYS HYPERBARIC OXYGEN THERAPY ICD-10 Diagnosis Description I70.245 Atherosclerosis of native arteries of left leg with ulceration of other part o L97.528 Non-pressure chronic ulcer of other part of  left foot with other specified sev 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 Quantity: 1 f foot erity Electronic Signature(s) Signed: 04/30/2023 1:32:05 PM By: Karl Bales EMT Signed: 05/01/2023 9:31:55 AM By: [name] MD Entered By: Karl Bales on 04/30/2023 10:32:04

## 2023-05-01 NOTE — Progress Notes (Signed)
April 29, 1966 (57 y.o. M) Primary Care [name]: [name] Other Clinician: Referring Janita Camberos: Treating [name]/Extender: [name] in Treatment: 18 Active Problems Location of Pain Severity and Description of Pain Patient Has Paino No Site Locations Pain Management and Medication Current Pain Management: Electronic Signature(s) Signed: 04/30/2023 4:09:20 PM By: [name] Entered By: [name] on 04/30/2023 08:30:20 -------------------------------------------------------------------------------- Patient/Caregiver  Education Details Patient Name: Date of Service: [name], [name] 9/26/2024andnbsp11:15 A M Medical Record Number: [id number] Patient Account Number: [id number] Date of Birth/Gender: Treating RN: [birth year] (57 y.o. [name] Primary Care Physician: [name] Other Clinician: Referring Physician: Treating Physician/Extender: [name] in Treatment: 18 Education Assessment Education Provided To: Patient Education Topics Provided Wound/Skin Impairment: Methods: Explain/Verbal Responses: State content correctly [name], [name] ([id number]) [id number] Page 7 of 8 Electronic Signature(s) Signed: 04/30/2023 1:43:41 PM By: [name] Schwalbe RN Entered By: [name] on 04/30/2023 10:36:26 -------------------------------------------------------------------------------- Wound Assessment Details Patient Name: Date of Service: [place], [name] 04/30/2023 11:15 A M Medical Record Number: [id number] Patient Account Number: [id number] Date of Birth/Sex: Treating RN: [birth year] (57 y.o. M) Primary Care [name]: [name] Other Clinician: Referring [name]: Treating [name]/Extender: [name] in Treatment: 18 Wound Status Wound Number: 1 Primary Diabetic Wound/Ulcer of the Lower Extremity Etiology: Wound Location: Left Achilles Secondary Arterial Insufficiency Ulcer Wounding Event: Gradually Appeared Etiology: Date Acquired: 12/07/2022 Wound Open Weeks Of Treatment: 18 Status: Clustered Wound: No Comorbid Cataracts, Coronary Artery Disease, Hypertension, Peripheral History: Venous Disease, Type I Diabetes, Rheumatoid Arthritis, Osteoarthritis Wound Measurements Length: (cm) 0.5 Width: (cm) 0.6 Depth: (cm) 0.2 Area: (cm) 0.236 Volume: (cm) 0.047 % Reduction in Area: 53.1% % Reduction in Volume: 6% Wound Description Classification: Exudate Amount: Exudate  Type: Exudate Color: Grade 2 Medium Serous amber Periwound Skin Texture Texture Color No Abnormalities Noted: No No Abnormalities Noted: No Moisture No Abnormalities Noted: No Assessment Notes No actual measurements taken as the patient follows Dr. [name] for Wound Care. Keresis skin sub placed by Dr. [name]. No photographs taken. Electronic Signature(s) Signed: 04/30/2023 1:43:41 PM By: [name] Schwalbe RN Entered By: [name] on 04/30/2023 09:06:53 -------------------------------------------------------------------------------- Vitals Details Patient Name: Date of Service: [name] Pont, PA [name] J. 04/30/2023 11:15 A M Medical Record Number: [id number] Patient Account Number: [id number] Date of Birth/Sex: Treating RN: June 04, 1966 (57 y.o. M) Primary Care [name]: [name] Other Clinician: [name], [name] ([id number]) [id number] Page 8 of 8 Referring [name]: Treating [name]/Extender: [name] in Treatment: 18 Vital Signs Time Taken: 11:28 Reference Range: 80 - 120 mg / dl Height (in): 68 Weight (lbs): 200 Body Mass Index (BMI): 30.4 Electronic Signature(s) Signed: 04/30/2023 4:09:20 PM By: [name] Entered By: [name] on 04/30/2023 08:28:53  wound []  - 0 Complex Wound Cleansing - multiple wounds []  - 0 Wound Imaging (photographs - any number of wounds) []  - 0 Wound Tracing (instead of photographs) []  - 0 Simple Wound Measurement - one wound []  - 0 Complex Wound Measurement - multiple wounds INTERVENTIONS - Wound Dressings []  - 0 Small Wound Dressing one or multiple wounds []  - 0 Medium Wound Dressing one or multiple wounds []  - 0 Large Wound Dressing one or multiple wounds []  - 0 Application of Medications - topical []  - 0 Application of Medications - injection INTERVENTIONS - Miscellaneous []  - 0 External ear exam []  - 0 Specimen Collection (cultures, biopsies, blood, body fluids, etc.) []  - 0 Specimen(s) / Culture(s) sent or taken to Lab for analysis []  - 0 Patient Transfer (multiple staff / Nurse, adult / Similar devices) []  - 0 Simple Staple / Suture removal (25 or less) []  - 0 Complex Staple / Suture removal (26 or more) []  - 0 Hypo / Hyperglycemic Management (close monitor of Blood Glucose) [name] ([id number]) [id number] Page 3 of 8 []  - 0 Ankle / Brachial Index (ABI) - do not check if billed separately X- 1 5 Vital Signs Has the patient been seen at the hospital within the last three years: Yes Total Score: 65 Level Of Care: New/Established - Level 2 Electronic Signature(s) Signed: 04/30/2023 1:43:41 PM By: [name] Schwalbe RN Entered By: [name] on 04/30/2023 10:37:06 -------------------------------------------------------------------------------- Encounter Discharge Information Details Patient Name: Date of Service: [name] Pont, PA [name] J. 04/30/2023 11:15 A M Medical Record Number: [id number] Patient Account  Number: [id number] Date of Birth/Sex: Treating RN: 05/16/1966 (58 y.o. [name] Primary Care [name]: [name] Other Clinician: Referring [name]: Treating [name]/Extender: [name] in Treatment: 18 Encounter Discharge Information Items Discharge Condition: Stable Ambulatory Status: Ambulatory Discharge Destination: Home Transportation: Private Auto Accompanied By: self Schedule Follow-up Appointment: Yes Clinical Summary of Care: Patient Declined Electronic Signature(s) Signed: 04/30/2023 1:43:41 PM By: [name] Schwalbe RN Entered By: [name] on 04/30/2023 10:40:23 -------------------------------------------------------------------------------- Lower Extremity Assessment Details Patient Name: Date of Service: [place], [name] 04/30/2023 11:15 A M Medical Record Number: [id number] Patient Account Number: [id number] Date of Birth/Sex: Treating RN: Dec 17, 1965 (57 y.o. M) Primary Care Chealsea Paske: [name] Other Clinician: Referring Kaylise Blakeley: Treating Jennalee Greaves/Extender: [name] in Treatment: 18 Edema Assessment Assessed: [name]: No] Franne Forts: No] Edema: [Left: N] [Right: o] Calf Left: Right: Point of Measurement: 32 cm From Medial Instep 35.5 cm Ankle Left: Right: Point of Measurement: 13 cm From Medial Instep 22 cm Vascular Assessment RASHED, EDLER ([id number]) [Right:[id number] Page 4 of 8] Extremity colors, hair growth, and conditions: Extremity Color: [Left:Normal] Hair Growth on Extremity: [Left:Yes] Temperature of Extremity: [Left:Warm] Capillary Refill: [Left:< 3 seconds] Dependent Rubor: [Left:No No] Electronic Signature(s) Signed: 04/30/2023 4:09:20 PM By: [name] Entered By: [name] on 04/30/2023 08:32:08 -------------------------------------------------------------------------------- Multi Wound Chart Details Patient Name:  Date of Service: [name] Pont, PA [name] J. 04/30/2023 11:15 A M Medical Record Number: [id number] Patient Account Number: [id number] Date of Birth/Sex: Treating RN: [birth year] (57 y.o. M) Primary Care Ellaree Gear: [name] Other Clinician: Referring Esai Stecklein: Treating Melena Hayes/Extender: [name] in Treatment: 18 [1:Photos: No Photos Left Achilles Wound Location: Gradually Appeared Wounding Event: Diabetic Wound/Ulcer of the Lower Primary Etiology: Extremity Arterial Insufficiency Ulcer Secondary Etiology: Cataracts, Coronary Artery Disease, N/A Comorbid History:  Hypertension, Peripheral Venous Disease, Type I Diabetes, Rheumatoid Arthritis, Osteoarthritis 12/07/2022 Date Acquired: 18 Weeks of Treatment:  April 29, 1966 (57 y.o. M) Primary Care [name]: [name] Other Clinician: Referring Janita Camberos: Treating [name]/Extender: [name] in Treatment: 18 Active Problems Location of Pain Severity and Description of Pain Patient Has Paino No Site Locations Pain Management and Medication Current Pain Management: Electronic Signature(s) Signed: 04/30/2023 4:09:20 PM By: [name] Entered By: [name] on 04/30/2023 08:30:20 -------------------------------------------------------------------------------- Patient/Caregiver  Education Details Patient Name: Date of Service: [name], [name] 9/26/2024andnbsp11:15 A M Medical Record Number: [id number] Patient Account Number: [id number] Date of Birth/Gender: Treating RN: [birth year] (57 y.o. [name] Primary Care Physician: [name] Other Clinician: Referring Physician: Treating Physician/Extender: [name] in Treatment: 18 Education Assessment Education Provided To: Patient Education Topics Provided Wound/Skin Impairment: Methods: Explain/Verbal Responses: State content correctly [name], [name] ([id number]) [id number] Page 7 of 8 Electronic Signature(s) Signed: 04/30/2023 1:43:41 PM By: [name] Schwalbe RN Entered By: [name] on 04/30/2023 10:36:26 -------------------------------------------------------------------------------- Wound Assessment Details Patient Name: Date of Service: [place], [name] 04/30/2023 11:15 A M Medical Record Number: [id number] Patient Account Number: [id number] Date of Birth/Sex: Treating RN: [birth year] (57 y.o. M) Primary Care [name]: [name] Other Clinician: Referring [name]: Treating [name]/Extender: [name] in Treatment: 18 Wound Status Wound Number: 1 Primary Diabetic Wound/Ulcer of the Lower Extremity Etiology: Wound Location: Left Achilles Secondary Arterial Insufficiency Ulcer Wounding Event: Gradually Appeared Etiology: Date Acquired: 12/07/2022 Wound Open Weeks Of Treatment: 18 Status: Clustered Wound: No Comorbid Cataracts, Coronary Artery Disease, Hypertension, Peripheral History: Venous Disease, Type I Diabetes, Rheumatoid Arthritis, Osteoarthritis Wound Measurements Length: (cm) 0.5 Width: (cm) 0.6 Depth: (cm) 0.2 Area: (cm) 0.236 Volume: (cm) 0.047 % Reduction in Area: 53.1% % Reduction in Volume: 6% Wound Description Classification: Exudate Amount: Exudate  Type: Exudate Color: Grade 2 Medium Serous amber Periwound Skin Texture Texture Color No Abnormalities Noted: No No Abnormalities Noted: No Moisture No Abnormalities Noted: No Assessment Notes No actual measurements taken as the patient follows Dr. [name] for Wound Care. Keresis skin sub placed by Dr. [name]. No photographs taken. Electronic Signature(s) Signed: 04/30/2023 1:43:41 PM By: [name] Schwalbe RN Entered By: [name] on 04/30/2023 09:06:53 -------------------------------------------------------------------------------- Vitals Details Patient Name: Date of Service: [name] Pont, [place] [name] J. 04/30/2023 11:15 A M Medical Record Number: [id number] Patient Account Number: [id number] Date of Birth/Sex: Treating RN: June 04, 1966 (57 y.o. M) Primary Care [name]: [name] Other Clinician: [name], [name] ([id number]) [id number] Page 8 of 8 Referring [name]: Treating [name]/Extender: [name] in Treatment: 18 Vital Signs Time Taken: 11:28 Reference Range: 80 - 120 mg / dl Height (in): 68 Weight (lbs): 200 Body Mass Index (BMI): 30.4 Electronic Signature(s) Signed: 04/30/2023 4:09:20 PM By: [name] Entered By: [name] on 04/30/2023 08:28:53  wound []  - 0 Complex Wound Cleansing - multiple wounds []  - 0 Wound Imaging (photographs - any number of wounds) []  - 0 Wound Tracing (instead of photographs) []  - 0 Simple Wound Measurement - one wound []  - 0 Complex Wound Measurement - multiple wounds INTERVENTIONS - Wound Dressings []  - 0 Small Wound Dressing one or multiple wounds []  - 0 Medium Wound Dressing one or multiple wounds []  - 0 Large Wound Dressing one or multiple wounds []  - 0 Application of Medications - topical []  - 0 Application of Medications - injection INTERVENTIONS - Miscellaneous []  - 0 External ear exam []  - 0 Specimen Collection (cultures, biopsies, blood, body fluids, etc.) []  - 0 Specimen(s) / Culture(s) sent or taken to Lab for analysis []  - 0 Patient Transfer (multiple staff / Nurse, adult / Similar devices) []  - 0 Simple Staple / Suture removal (25 or less) []  - 0 Complex Staple / Suture removal (26 or more) []  - 0 Hypo / Hyperglycemic Management (close monitor of Blood Glucose) [name] ([id number]) [id number] Page 3 of 8 []  - 0 Ankle / Brachial Index (ABI) - do not check if billed separately X- 1 5 Vital Signs Has the patient been seen at the hospital within the last three years: Yes Total Score: 65 Level Of Care: New/Established - Level 2 Electronic Signature(s) Signed: 04/30/2023 1:43:41 PM By: [name] Schwalbe RN Entered By: [name] on 04/30/2023 10:37:06 -------------------------------------------------------------------------------- Encounter Discharge Information Details Patient Name: Date of Service: [name] Pont, [place] [name] J. 04/30/2023 11:15 A M Medical Record Number: [id number] Patient Account  Number: [id number] Date of Birth/Sex: Treating RN: 05/16/1966 (58 y.o. [name] Primary Care [name]: [name] Other Clinician: Referring [name]: Treating [name]/Extender: [name] in Treatment: 18 Encounter Discharge Information Items Discharge Condition: Stable Ambulatory Status: Ambulatory Discharge Destination: Home Transportation: Private Auto Accompanied By: self Schedule Follow-up Appointment: Yes Clinical Summary of Care: Patient Declined Electronic Signature(s) Signed: 04/30/2023 1:43:41 PM By: [name] Schwalbe RN Entered By: [name] on 04/30/2023 10:40:23 -------------------------------------------------------------------------------- Lower Extremity Assessment Details Patient Name: Date of Service: [place], [name] 04/30/2023 11:15 A M Medical Record Number: [id number] Patient Account Number: [id number] Date of Birth/Sex: Treating RN: Dec 17, 1965 (57 y.o. M) Primary Care Chealsea Paske: [name] Other Clinician: Referring Kaylise Blakeley: Treating Jennalee Greaves/Extender: [name] in Treatment: 18 Edema Assessment Assessed: [name]: No] Franne Forts: No] Edema: [Left: N] [Right: o] Calf Left: Right: Point of Measurement: 32 cm From Medial Instep 35.5 cm Ankle Left: Right: Point of Measurement: 13 cm From Medial Instep 22 cm Vascular Assessment RASHED, EDLER ([id number]) [Right:[id number] Page 4 of 8] Extremity colors, hair growth, and conditions: Extremity Color: [Left:Normal] Hair Growth on Extremity: [Left:Yes] Temperature of Extremity: [Left:Warm] Capillary Refill: [Left:< 3 seconds] Dependent Rubor: [Left:No No] Electronic Signature(s) Signed: 04/30/2023 4:09:20 PM By: [name] Entered By: [name] on 04/30/2023 08:32:08 -------------------------------------------------------------------------------- Multi Wound Chart Details Patient Name:  Date of Service: [name] Pont, PA [name] J. 04/30/2023 11:15 A M Medical Record Number: [id number] Patient Account Number: [id number] Date of Birth/Sex: Treating RN: [birth year] (57 y.o. M) Primary Care Ellaree Gear: [name] Other Clinician: Referring Esai Stecklein: Treating Melena Hayes/Extender: [name] in Treatment: 18 [1:Photos: No Photos Left Achilles Wound Location: Gradually Appeared Wounding Event: Diabetic Wound/Ulcer of the Lower Primary Etiology: Extremity Arterial Insufficiency Ulcer Secondary Etiology: Cataracts, Coronary Artery Disease, N/A Comorbid History:  Hypertension, Peripheral Venous Disease, Type I Diabetes, Rheumatoid Arthritis, Osteoarthritis 12/07/2022 Date Acquired: 18 Weeks of Treatment:

## 2023-05-01 NOTE — Progress Notes (Signed)
[name], [name] ([id number]) [id number] Page 1 of 2 Visit Report for 05/01/2023 Arrival Information Details Patient Name: Date of Service: [name], [name] 05/01/2023 7:30 A M Medical Record Number: [id number] Patient Account Number: [id number] Date of Birth/Sex: Treating RN: 08-02-1966 (57 y.o. [name], [name] Primary Care Christoph Copelan: [name] Other Clinician: Referring Ksean Vale: Treating Fredricka Kohrs/Extender: Marena Chancy in Treatment: 18 Visit Information History Since Last Visit Added or deleted any medications: No Patient Arrived: Ambulatory Any new allergies or adverse reactions: No Arrival Time: 07:35 Had a fall or experienced change in No Accompanied By: self activities of daily living that may affect Transfer Assistance: None risk of falls: Patient Identification Verified: Yes Signs or symptoms of abuse/neglect since last visito No Secondary Verification Process Completed: Yes Hospitalized since last visit: No Patient Requires Transmission-Based Precautions: No Implantable device outside of the clinic excluding No Patient Has Alerts: No cellular tissue based products placed in the center since last visit: Has Dressing in Place as Prescribed: Yes Pain Present Now: No Electronic Signature(s) Signed: 05/01/2023 11:30:54 AM By: Shawn Stall RN, BSN Entered By: Shawn Stall on 05/01/2023 05:23:49 -------------------------------------------------------------------------------- Encounter Discharge Information Details Patient Name: Date of Service: [name] Pont, [name] 05/01/2023 7:30 A M Medical Record Number: [id number] Patient Account Number: [id number] Date of Birth/Sex: Treating RN: [birth year] (57 y.o. [name] Primary Care Daymen Hassebrock: [name] Other Clinician: Referring Dallyn Bergland: Treating Cove Ducre/Extender: Marena Chancy in Treatment: 18 Encounter Discharge Information  Items Discharge Condition: Stable Ambulatory Status: Ambulatory Discharge Destination: Home Transportation: Private Auto Accompanied By: self Schedule Follow-up Appointment: Yes Clinical Summary of Care: Electronic Signature(s) Signed: 05/01/2023 11:30:54 AM By: Shawn Stall RN, BSN Entered By: Shawn Stall on 05/01/2023 07:22:52 [name] ([id number]) [id number] Page 2 of 2 -------------------------------------------------------------------------------- Vitals Details Patient Name: Date of Service: [name], [name] 05/01/2023 7:30 A M Medical Record Number: [id number] Patient Account Number: [id number] Date of Birth/Sex: Treating RN: 09/15/1965 (57 y.o. [name] Primary Care Lise Pincus: [name] Other Clinician: Referring Bernd Crom: Treating Almeter Westhoff/Extender: Marena Chancy in Treatment: 18 Vital Signs Time Taken: 07:35 Temperature (F): 97.3 Height (in): 68 Pulse (bpm): 75 Weight (lbs): 200 Respiratory Rate (breaths/min): 18 Body Mass Index (BMI): 30.4 Blood Pressure (mmHg): 143/62 Capillary Blood Glucose (mg/dl): 235 Reference Range: 80 - 120 mg / dl Electronic Signature(s) Signed: 05/01/2023 11:30:54 AM By: Shawn Stall RN, BSN Entered By: Shawn Stall on 05/01/2023 57:32:20

## 2023-05-01 NOTE — Progress Notes (Signed)
[name], [name] ([id number]) [id number] Page 1 of 1 Visit Report for 05/01/2023 SuperBill Details Patient Name: Date of Service: SINE, [name] 05/01/2023 Medical Record Number: [id number] Patient Account Number: [id number] Date of Birth/Sex: Treating RN: 1965-08-23 (57 y.o. 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 Facility Procedures CPT4 Code Description Modifier Quantity 57846962 G0277-(Facility Use Only) HBOT full body chamber, , 4 Physician Procedures Quantity CPT4 Code Description Modifier [id number] 99183 - WC PHYS HYPERBARIC OXYGEN THERAPY 1 ICD-10 Diagnosis Description 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 L97.528 Non-pressure chronic ulcer of other part of left foot with other specified severity Electronic Signature(s) Signed: 05/01/2023 10:43:18 AM By: Duanne Guess MD FACS Signed: 05/01/2023 11:30:54 AM By: Shawn Stall RN, BSN Entered By: Shawn Stall on 05/01/2023 07:22:31

## 2023-05-01 NOTE — Progress Notes (Signed)
KRISHNA, SCHNETZER ([id number]) [id number] Page 1 of 2 Visit Report for 05/01/2023 HBO Details Patient Name: Date of Service: [name], [name] 05/01/2023 7:30 A M Medical Record Number: [id number] Patient Account Number: [id number] Date of Birth/Sex: Treating RN: 08-08-1965 (57 y.o. [name], [name] Primary Care Ramari Bray: [name] Other Clinician: Referring Bernestine Holsapple: Treating Kadian Barcellos/Extender: Marena Chancy in Treatment: 18 HBO Treatment Course Details Treatment Course Number: 1 Ordering Kabir Brannock: Geralyn Corwin T Treatments Ordered: otal 60 HBO Treatment Start Date: 02/16/2023 HBO Indication: Other (specify in Notes) Notes: Acute peripheral arterial insufficiency HBO Treatment Details Treatment Number: 44 Patient Type: Outpatient Chamber Type: Monoplace Chamber Serial #: [id number] Treatment Protocol: 2.0 ATA with 90 minutes oxygen, with two 5 minute air breaks Treatment Details Compression Rate Down: 2.0 psi / minute De-Compression Rate Up: 2.0 psi / minute A breaks and breathing ir Compress Tx Pressure periods Decompress Decompress Begins Reached (leave unused spaces Begins Ends blank) Chamber Pressure (ATA 1 2 2 2 2 2  --2 1 ) Clock Time (24 hr) 07:57 08:07 08:37 08:42 09:12 09:17 - - 09:47 09:57 Treatment Length: 120 (minutes) Treatment Segments: 4 Vital Signs Capillary Blood Glucose Reference Range: 80 - 120 mg / dl HBO Diabetic Blood Glucose Intervention Range: <131 mg/dl or >578 mg/dl Time Vitals Blood Respiratory Capillary Blood Glucose Pulse Action Type: Pulse: Temperature: Taken: Pressure: Rate: Glucose (mg/dl): Meter #: Oximetry (%) Taken: Pre 07:35 143/62 75 18 97.3 249 Post 09:58 130/66 67 16 97.3 195 Treatment Response Treatment Toleration: Well Treatment Completion Status: Treatment Completed without Adverse Event Physician HBO Attestation: I certify that I supervised this HBO treatment in  accordance with Medicare guidelines. A trained emergency response team is readily available per Yes hospital policies and procedures. Continue HBOT as ordered. Yes Electronic Signature(s) Signed: 05/01/2023 10:43:40 AM By: Duanne Guess MD FACS Entered By: Duanne Guess on 05/01/2023 07:43:39 Schoenbeck, [name] ([id number]) [id number] Page 2 of 2 -------------------------------------------------------------------------------- HBO Safety Checklist Details Patient Name: Date of Service: SWAGGER, [name] 05/01/2023 7:30 A M Medical Record Number: [id number] Patient Account Number: [id number] Date of Birth/Sex: Treating RN: 1966/02/08 (57 y.o. [name], [name] Primary Care Yerlin Gasparyan: [name] Other Clinician: Referring Brigido Mera: Treating Rebekkah Powless/Extender: Marena Chancy in Treatment: 18 HBO Safety Checklist Items Safety Checklist Consent Form Signed Patient voided / foley secured and emptied When did you last eato 0700-egg sandwich, protein shake, coffee Last dose of injectable or oral agent 0630- 17 units of lantus Ostomy pouch emptied and vented if applicable NA All implantable devices assessed, documented and approved NA Intravenous access site secured and place NA Valuables secured Linens and cotton and cotton/polyester blend (less than 51% polyester) Personal oil-based products / skin lotions / body lotions removed Wigs or hairpieces removed NA Smoking or tobacco materials removed Books / newspapers / magazines / loose paper removed Cologne, aftershave, perfume and deodorant removed Jewelry removed (may wrap wedding band) Make-up removed NA Hair care products removed NA Battery operated devices (external) removed NA Heating patches and chemical warmers removed NA Titanium eyewear removed NA Nail polish cured greater than 10 hours NA Casting material cured greater than 10 hours NA Hearing aids  removed NA Loose dentures or partials removed NA Prosthetics have been removed NA Patient demonstrates correct use of air break device (if applicable) Patient concerns have been addressed Patient grounding bracelet on and cord attached to chamber Specifics for Inpatients (complete in addition to above) Medication sheet sent with patient Intravenous medications needed  or due during therapy sent with patient Drainage tubes (e.g. nasogastric tube or chest tube secured and vented) Endotracheal or Tracheotomy tube secured Cuff deflated of air and inflated with saline Airway suctioned Electronic Signature(s) Signed: 05/01/2023 11:30:54 AM By: Shawn Stall RN, BSN Entered By: Shawn Stall on 05/01/2023 30:86:57

## 2023-05-04 ENCOUNTER — Other Ambulatory Visit: Payer: Self-pay | Admitting: Internal Medicine

## 2023-05-04 ENCOUNTER — Encounter (HOSPITAL_BASED_OUTPATIENT_CLINIC_OR_DEPARTMENT_OTHER): Payer: 59 | Admitting: Internal Medicine

## 2023-05-04 DIAGNOSIS — E10621 Type 1 diabetes mellitus with foot ulcer: Secondary | ICD-10-CM | POA: Diagnosis not present

## 2023-05-04 LAB — GLUCOSE, CAPILLARY
Glucose-Capillary: 163 mg/dL — ABNORMAL HIGH (ref 70–99)
Glucose-Capillary: 191 mg/dL — ABNORMAL HIGH (ref 70–99)

## 2023-05-04 NOTE — Progress Notes (Addendum)
NAVON, KOTOWSKI ([id number]) [id number] Page 1 of 2 Visit Report for 05/04/2023 Arrival Information Details Patient Name: Date of Service: KOLENOVIC, [name] 05/04/2023 7:30 A M Medical Record Number: [id number] Patient Account Number: [id number] Date of Birth/Sex: Treating RN: 29-May-1966 (57 y.o. Bayard Hugger, Bonita Quin Primary Care Sylar Voong: [name] Other Clinician: [name] Referring Gerrie Castiglia: Treating Krystan Northrop/Extender: Theodis Sato in Treatment: 18 Visit Information History Since Last Visit All ordered tests and consults were completed: Yes Patient Arrived: Ambulatory Added or deleted any medications: No Arrival Time: 07:46 Any new allergies or adverse reactions: No Accompanied By: self Had a fall or experienced change in No Transfer Assistance: None activities of daily living that may affect Patient Identification Verified: Yes risk of falls: Secondary Verification Process Completed: Yes Signs or symptoms of abuse/neglect since last visito No Patient Requires Transmission-Based Precautions: No Hospitalized since last visit: No Patient Has Alerts: No Implantable device outside of the clinic excluding No cellular tissue based products placed in the center since last visit: Pain Present Now: No Electronic Signature(s) Signed: 05/04/2023 11:36:05 AM By: [name] CHT EMT BS , , Entered By: [name] on 05/04/2023 11:36:05 -------------------------------------------------------------------------------- Encounter Discharge Information Details Patient Name: Date of Service: [name], [name] 05/04/2023 7:30 A M Medical Record Number: [id number] Patient Account Number: [id number] Date of Birth/Sex: Treating RN: 1966/01/18 (58 y.o. [name] Primary Care Mayerli Kirst: [name] Other Clinician: [name] Referring Lurleen Soltero: Treating Hayven Fatima/Extender: Theodis Sato in Treatment: 18 Encounter Discharge Information Items Discharge Condition: Stable Ambulatory Status: Ambulatory Discharge Destination: Other (Note Required) Transportation: Private Auto Accompanied By: self Schedule Follow-up Appointment: No Clinical Summary of Care: Electronic Signature(s) Signed: 05/04/2023 3:22:14 PM By: [name] CHT EMT BS , , Entered By: [name] on 05/04/2023 15:22:13 [name] ([id number]) [id number] Page 2 of 2 -------------------------------------------------------------------------------- Vitals Details Patient Name: Date of Service: HAZEL, [name] 05/04/2023 7:30 A M Medical Record Number: [id number] Patient Account Number: [id number] Date of Birth/Sex: Treating RN: [birth year] (57 y.o. [name] Primary Care Rosamond Andress: [name] Other Clinician: Karl Bales Referring Kendryck Lacroix: Treating Daila Elbert/Extender: Theodis Sato in Treatment: 18 Vital Signs Time Taken: 07:50 Temperature (F): 97.6 Height (in): 68 Pulse (bpm): 66 Weight (lbs): 200 Respiratory Rate (breaths/min): 18 Body Mass Index (BMI): 30.4 Blood Pressure (mmHg): 125/56 Capillary Blood Glucose (mg/dl): 706 Reference Range: 80 - 120 mg / dl Electronic Signature(s) Signed: 05/04/2023 11:36:41 AM By: [name] CHT EMT BS , , Entered By: [name] on 05/04/2023 11:36:41

## 2023-05-04 NOTE — Progress Notes (Signed)
[name], [name] ([id number]) [id number] Page 1 of 2 Visit Report for 05/04/2023 HBO Details Patient Name: Date of Service: [name], [name] 05/04/2023 7:30 A M Medical Record Number: [id number] Patient Account Number: [id number] Date of Birth/Sex: Treating RN: [birth year] (57 y.o. [name] Primary Care Sandford Diop: [name] Other Clinician: [name] Referring Grizel Vesely: Treating Catelyn Friel/Extender: Theodis Sato in Treatment: 18 HBO Treatment Course Details Treatment Course Number: 1 Ordering Chayse Gracey: Geralyn Corwin T Treatments Ordered: otal 60 HBO Treatment Start Date: 02/16/2023 HBO Indication: Other (specify in Notes) Notes: Acute peripheral arterial insufficiency HBO Treatment Details Treatment Number: 45 Patient Type: Outpatient Chamber Type: Monoplace Chamber Serial #: [id number] Treatment Protocol: 2.0 ATA with 90 minutes oxygen, with two 5 minute air breaks Treatment Details Compression Rate Down: 1.5 psi / minute De-Compression Rate Up: 2.0 psi / minute A breaks and breathing ir Compress Tx Pressure periods Decompress Decompress Begins Reached (leave unused spaces Begins Ends blank) Chamber Pressure (ATA 1 2 2 2 2 2  --2 1 ) Clock Time (24 hr) 08:12 08:25 08:55 09:00 09:30 09:35 - - 10:05 10:15 Treatment Length: 123 (minutes) Treatment Segments: 4 Vital Signs Capillary Blood Glucose Reference Range: 80 - 120 mg / dl HBO Diabetic Blood Glucose Intervention Range: <131 mg/dl or >732 mg/dl Type: Time Vitals Blood Respiratory Capillary Blood Glucose Pulse Action Pulse: Temperature: Taken: Pressure: Rate: Glucose (mg/dl): Meter #: Oximetry (%) Taken: Pre 07:50 125/56 66 18 97.6 163 none per protocol Post 10:18 123/67 64 18 98.1 191 none per protocol Treatment Response Treatment Toleration: Well Treatment Completion Status: Treatment Completed without Adverse Event Treatment Notes Mr.  Kinzie arrived with vital signs within normal range except for diastolic BP at 56 mmHg. He was asymptomatic for hypotension. After performing a safety check, he was placed in the chamber which was compressed with 100% oxygen at a rate of 2 psi/min after confirming normal ear equalization. He tolerated the treatment and subsequent decompression of the chamber at a rate of 2 psi/min. His post-treatment vital signs were within normal range. He was stable upon discharge to wound care for visit. Ellis Mehaffey Notes No concerns with treatment given. Patient was also seen for wound care review Physician HBO Attestation: I certify that I supervised this HBO treatment in accordance with Medicare guidelines. A trained emergency response team is readily available per Yes hospital policies and procedures. Continue HBOT as ordered. Yes Electronic Signature(s) Signed: 05/05/2023 7:25:57 AM By: [name] MD Previous Signature: 05/04/2023 3:20:48 PM Version By: [name] CHT EMT BS , , Entered By: [name] on 05/04/2023 12:35:08 [name] ([id number]) [id number] Page 2 of 2 -------------------------------------------------------------------------------- HBO Safety Checklist Details Patient Name: Date of Service: [name], [name] 05/04/2023 7:30 A M Medical Record Number: [id number] Patient Account Number: [id number] Date of Birth/Sex: Treating RN: 1966/04/11 (57 y.o. [name] Primary Care Wilmont Olund: [name] Other Clinician: Karl Bales Referring Jeri Rawlins: Treating Pattijo Juste/Extender: Theodis Sato in Treatment: 18 HBO Safety Checklist Items Safety Checklist Consent Form Signed Patient voided / foley secured and emptied When did you last eato 0700- Protein Shake, SEC Sandwich, Coffee Last dose of injectable or oral agent 0630 - 17 units Lantus Ostomy pouch emptied and vented if applicable NA All implantable  devices assessed, documented and approved Freestyle Libre 2 and3 on approved list Intravenous access site secured and place NA Valuables secured Linens and cotton and cotton/polyester blend (less than 51% polyester) Personal oil-based products / skin lotions /  body lotions removed Wigs or hairpieces removed NA Smoking or tobacco materials removed NA Books / newspapers / magazines / loose paper removed Cologne, aftershave, perfume and deodorant removed Jewelry removed (may wrap wedding band) Make-up removed NA Hair care products removed Battery operated devices (external) removed Heating patches and chemical warmers removed Titanium eyewear removed Nail polish cured greater than 10 hours NA Casting material cured greater than 10 hours NA Hearing aids removed NA Loose dentures or partials removed NA Prosthetics have been removed NA Patient demonstrates correct use of air break device (if applicable) Patient concerns have been addressed Patient grounding bracelet on and cord attached to chamber Specifics for Inpatients (complete in addition to above) Medication sheet sent with patient NA Intravenous medications needed or due during therapy sent with patient NA Drainage tubes (e.g. nasogastric tube or chest tube secured and vented) NA Endotracheal or Tracheotomy tube secured NA Cuff deflated of air and inflated with saline NA Airway suctioned NA Notes Paper version used prior to treatment start. Electronic Signature(s) Signed: 05/04/2023 11:38:34 AM By: [name] CHT EMT BS , , Entered By: [name] on 05/04/2023 08:38:34

## 2023-05-05 ENCOUNTER — Encounter (HOSPITAL_BASED_OUTPATIENT_CLINIC_OR_DEPARTMENT_OTHER): Payer: 59 | Attending: Internal Medicine | Admitting: Internal Medicine

## 2023-05-05 DIAGNOSIS — E1051 Type 1 diabetes mellitus with diabetic peripheral angiopathy without gangrene: Secondary | ICD-10-CM | POA: Insufficient documentation

## 2023-05-05 DIAGNOSIS — M069 Rheumatoid arthritis, unspecified: Secondary | ICD-10-CM | POA: Diagnosis not present

## 2023-05-05 DIAGNOSIS — J449 Chronic obstructive pulmonary disease, unspecified: Secondary | ICD-10-CM | POA: Insufficient documentation

## 2023-05-05 DIAGNOSIS — E10621 Type 1 diabetes mellitus with foot ulcer: Secondary | ICD-10-CM | POA: Diagnosis not present

## 2023-05-05 DIAGNOSIS — I70245 Atherosclerosis of native arteries of left leg with ulceration of other part of foot: Secondary | ICD-10-CM | POA: Diagnosis not present

## 2023-05-05 DIAGNOSIS — L97528 Non-pressure chronic ulcer of other part of left foot with other specified severity: Secondary | ICD-10-CM | POA: Diagnosis present

## 2023-05-05 LAB — GLUCOSE, CAPILLARY
Glucose-Capillary: 139 mg/dL — ABNORMAL HIGH (ref 70–99)
Glucose-Capillary: 192 mg/dL — ABNORMAL HIGH (ref 70–99)

## 2023-05-05 NOTE — Progress Notes (Signed)
SANTONIO, SPEAKMAN ([id number]) [id number] Page 1 of 1 Visit Report for 05/04/2023 SuperBill Details Patient Name: Date of Service: CAPEK, [name] 05/04/2023 Medical Record Number: [id number] Patient Account Number: [id number] Date of Birth/Sex: Treating RN: Nov 11, 1965 (57 y.o. 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 Physician Procedures Quantity CPT4 Code Description Modifier [id number] 99183 - WC PHYS HYPERBARIC OXYGEN THERAPY 1 ICD-10 Diagnosis Description 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

## 2023-05-05 NOTE — Progress Notes (Signed)
NICKOLIS, DIEL ([id number]) [id number] Page 1 of 2 Visit Report for 05/05/2023 Arrival Information Details Patient Name: Date of Service: [name], [name] 05/05/2023 7:30 A M Medical Record Number: [id number] Patient Account Number: [id number] Date of Birth/Sex: Treating RN: 1965-12-22 (57 y.o. [name], [name] Primary Care Constantinos Krempasky: [name] Other Clinician: [name] Referring Chistian Kasler: Treating Naya Ilagan/Extender: Theodis Sato in Treatment: 18 Visit Information History Since Last Visit All ordered tests and consults were completed: Yes Patient Arrived: Ambulatory Added or deleted any medications: No Arrival Time: 07:31 Any new allergies or adverse reactions: No Accompanied By: self Had a fall or experienced change in No Transfer Assistance: None activities of daily living that may affect Patient Identification Verified: Yes risk of falls: Secondary Verification Process Completed: Yes Signs or symptoms of abuse/neglect since last visito No Patient Requires Transmission-Based Precautions: No Hospitalized since last visit: No Patient Has Alerts: No Implantable device outside of the clinic excluding No cellular tissue based products placed in the center since last visit: Pain Present Now: No Electronic Signature(s) Signed: 05/05/2023 12:24:17 PM By: [name] CHT EMT BS , , Entered By: [name] on 05/05/2023 12:24:16 -------------------------------------------------------------------------------- Encounter Discharge Information Details Patient Name: Date of Service: [name] Pont, [name] [name] 05/05/2023 7:30 A M Medical Record Number: [id number] Patient Account Number: [id number] Date of Birth/Sex: Treating RN: 1965/11/03 (57 y.o. [name] Primary Care Shanigua Gibb: [name] Other Clinician: [name] Referring Careli Luzader: Treating Mohan Erven/Extender: Theodis Sato in Treatment: 18 Encounter Discharge Information Items Discharge Condition: Stable Ambulatory Status: Ambulatory Discharge Destination: Home Transportation: Private Auto Accompanied By: self Schedule Follow-up Appointment: No Clinical Summary of Care: Electronic Signature(s) Signed: 05/05/2023 12:40:08 PM By: [name] CHT EMT BS , , Entered By: [name] on 05/05/2023 12:40:08 [name] ([id number]) [id number] Page 2 of 2 -------------------------------------------------------------------------------- Vitals Details Patient Name: Date of Service: [name], [name] 05/05/2023 7:30 A M Medical Record Number: [id number] Patient Account Number: [id number] Date of Birth/Sex: Treating RN: 1965-10-31 (57 y.o. [name], [name] Primary Care Lota Leamer: [name] Other Clinician: [name] Referring Dewell Monnier: Treating Blima Jaimes/Extender: Theodis Sato in Treatment: 18 Vital Signs Time Taken: 07:54 Temperature (F): 97.5 Height (in): 68 Pulse (bpm): 67 Weight (lbs): 200 Respiratory Rate (breaths/min): 18 Body Mass Index (BMI): 30.4 Blood Pressure (mmHg): 127/55 Capillary Blood Glucose (mg/dl): 010 Reference Range: 80 - 120 mg / dl Electronic Signature(s) Signed: 05/05/2023 12:24:44 PM By: [name] CHT EMT BS , , Entered By: [name] on 05/05/2023 12:24:44

## 2023-05-05 NOTE — Progress Notes (Signed)
Large (67-100%) Exposed Structure Granulation Quality: Red, Pink Fascia Exposed: No Necrotic Amount: Small (1-33%) Fat Layer (Subcutaneous Tissue) Exposed: Yes Necrotic Quality: Adherent Slough Tendon Exposed: No Muscle Exposed: No Joint Exposed: No Bone Exposed: No Periwound Skin Texture Texture Color No Abnormalities Noted: No No Abnormalities Noted: No [name], [name] ([id number]) [id number] Page 9 of 10 Callus: No Atrophie Blanche: No Crepitus: No Cyanosis: No Excoriation: No Ecchymosis: No Induration: No Erythema: No Rash: No Hemosiderin Staining: No Scarring: No Mottled: No Pallor: No Moisture Rubor: No No Abnormalities Noted: No Dry / Scaly: No Maceration: No Treatment Notes Wound #1 (Achilles) Wound Laterality: Left Cleanser Peri-Wound Care Topical Primary Dressing Promogran Prisma Matrix, 4.34 (sq in) (silver collagen) Discharge Instruction: Moisten collagen with hydrogel. apply only wound center. Secondary Dressing Woven Gauze Sponge, Non-Sterile 4x4 in Discharge Instruction: Apply over primary dressing as directed. Secured With Elastic Bandage 4 inch (ACE bandage) Discharge Instruction: Secure with ACE bandage as directed. Kerlix Roll Sterile, 4.5x3.1 (in/yd) Discharge Instruction: Secure with Kerlix as directed. 49M Medipore H Soft Cloth Surgical T ape, 4 x 10 (in/yd) Discharge Instruction: Secure with tape as directed. Compression Wrap Compression Stockings Add-Ons Electronic Signature(s) Signed: 05/04/2023 2:03:22 PM By: [name] Signed: 05/04/2023 4:40:44 PM By: [name] Stall RN, BSN Entered By: [name] on 05/04/2023  07:49:46 -------------------------------------------------------------------------------- Vitals Details Patient Name: Date of Service: [name], [name] [name] 05/04/2023 10:45 A M Medical Record Number: [id number] Patient Account Number: [id number] Date of Birth/Sex: Treating RN: 1966/07/15 (57 y.o. [name]: [name]: Referring [name]: Treating [name]/Extender: [name] in Treatment: 18 Vital Signs Time Taken: 10:43 Temperature (F): 98.1 Height (in): 68 Pulse (bpm): 64 Weight (lbs): 200 Respiratory Rate (breaths/min): 18 Body Mass Index (BMI): 30.4 Blood Pressure (mmHg): 123/67 Capillary Blood Glucose (mg/dl): 91 Reference Range: 80 - 120 mg / dl Electronic Signature(s) [name], [name] ([id number]) [id number] Page 10 of 10 Signed: 05/04/2023 4:40:44 PM By: [name] Stall RN, BSN Entered By: [name] on 05/04/2023 07:43:21  Mass Index(BMI): 30.4 Blood Pressure(mmHg): 123/67 Temperature(F): 98.1 Respiratory Rate(breaths/min): 18 [1:Photos:] [N/A:N/A] Left Achilles N/A N/A Wound Location: Gradually Appeared N/A N/A Wounding Event: Diabetic Wound/Ulcer of the Lower N/A N/A Primary Etiology: Extremity Arterial Insufficiency Ulcer N/A N/A Secondary Etiology: Cataracts, Coronary Artery Disease, N/A N/A Comorbid History: Hypertension, Peripheral Venous Disease, Type I Diabetes, Rheumatoid Arthritis, Osteoarthritis 12/07/2022 N/A N/A Date Acquired: 18 N/A N/A Weeks of Treatment: Open N/A N/A Wound Status: No N/A N/A Wound Recurrence: 1.2x0.9x0.2 N/A N/A Measurements L x W x D (cm) 0.848 N/A N/A A (cm) : rea 0.17 N/A N/A Volume (cm) : -68.60% N/A N/A % Reduction in Area: -240.00% N/A N/A % Reduction in VolumeWOODRUFF, [name] ([id number]) [id number] Page 5 of 10 Grade 2 N/A N/A Classification: Medium N/A N/A Exudate A mount: Serous N/A N/A Exudate Type: amber N/A N/A Exudate Color: Distinct, outline attached N/A N/A Wound Margin: Large (67-100%) N/A N/A Granulation A mount: Red, Pink N/A N/A Granulation Quality: Small (1-33%) N/A N/A Necrotic A mount: Fat Layer (Subcutaneous Tissue): Yes N/A  N/A Exposed Structures: Fascia: No Tendon: No Muscle: No Joint: No Bone: No Small (1-33%) N/A N/A Epithelialization: Excoriation: No N/A N/A Periwound Skin Texture: Induration: No Callus: No Crepitus: No Rash: No Scarring: No Maceration: No N/A N/A Periwound Skin Moisture: Dry/Scaly: No Atrophie Blanche: No N/A N/A Periwound Skin Color: Cyanosis: No Ecchymosis: No Erythema: No Hemosiderin Staining: No Mottled: No Pallor: No Rubor: No Treatment Notes Wound #1 (Achilles) Wound Laterality: Left Cleanser Peri-Wound Care Topical Primary Dressing Promogran Prisma Matrix, 4.34 (sq in) (silver collagen) Discharge Instruction: Moisten collagen with hydrogel. apply only wound center. Secondary Dressing Woven Gauze Sponge, Non-Sterile 4x4 in Discharge Instruction: Apply over primary dressing as directed. Secured With Elastic Bandage 4 inch (ACE bandage) Discharge Instruction: Secure with ACE bandage as directed. Kerlix Roll Sterile, 4.5x3.1 (in/yd) Discharge Instruction: Secure with Kerlix as directed. 65M Medipore H Soft Cloth Surgical T ape, 4 x 10 (in/yd) Discharge Instruction: Secure with tape as directed. Compression Wrap Compression Stockings Add-Ons Electronic Signature(s) Signed: 05/05/2023 7:25:57 AM By: [name] MD Entered By: [name] on 05/04/2023 08:51:54 -------------------------------------------------------------------------------- Multi-Disciplinary Care Plan Details Patient Name: Date of Service: [name], [name] [name] 05/04/2023 10:45 A [name] ([id number]) [id number] Page 6 of 10 Medical Record Number: [id number] Patient Account Number: [id number] Date of Birth/Sex: Treating RN: 06/17/1966 (57 y.o. [name], [name] Primary Care [name]: [name]: Referring [name]: Treating [name]/Extender: [name] in Treatment: 18 Active  Inactive HBO Nursing Diagnoses: Potential for barotraumas to ears, sinuses, teeth, and lungs or cerebral gas embolism related to changes in atmospheric pressure inside hyperbaric oxygen chamber Potential for oxygen toxicity seizures related to delivery of 100% oxygen at an increased atmospheric pressure Potential for pulmonary oxygen toxicity related to delivery of 100% oxygen at an increased atmospheric pressure Goals: Patient and/or family will be able to state/discuss factors appropriate to the management of their disease process during treatment Date Initiated: 02/16/2023 Target Resolution Date: 07/14/2023 Goal Status: Active Patient will tolerate the hyperbaric oxygen therapy treatment Date Initiated: 02/16/2023 Target Resolution Date: 07/14/2023 Goal Status: Active Patient/caregiver will verbalize understanding of HBO goals, rationale, procedures and potential hazards Date Initiated: 02/16/2023 Target Resolution Date: 07/14/2023 Goal Status: Active Interventions: Administer the correct therapeutic gas delivery based on the patients needs and limitations, per physician order Assess and provide for patients comfort related to the hyperbaric environment and equalization of middle ear Assess for signs and symptoms  Mass Index(BMI): 30.4 Blood Pressure(mmHg): 123/67 Temperature(F): 98.1 Respiratory Rate(breaths/min): 18 [1:Photos:] [N/A:N/A] Left Achilles N/A N/A Wound Location: Gradually Appeared N/A N/A Wounding Event: Diabetic Wound/Ulcer of the Lower N/A N/A Primary Etiology: Extremity Arterial Insufficiency Ulcer N/A N/A Secondary Etiology: Cataracts, Coronary Artery Disease, N/A N/A Comorbid History: Hypertension, Peripheral Venous Disease, Type I Diabetes, Rheumatoid Arthritis, Osteoarthritis 12/07/2022 N/A N/A Date Acquired: 18 N/A N/A Weeks of Treatment: Open N/A N/A Wound Status: No N/A N/A Wound Recurrence: 1.2x0.9x0.2 N/A N/A Measurements L x W x D (cm) 0.848 N/A N/A A (cm) : rea 0.17 N/A N/A Volume (cm) : -68.60% N/A N/A % Reduction in Area: -240.00% N/A N/A % Reduction in VolumeWOODRUFF, [name] ([id number]) [id number] Page 5 of 10 Grade 2 N/A N/A Classification: Medium N/A N/A Exudate A mount: Serous N/A N/A Exudate Type: amber N/A N/A Exudate Color: Distinct, outline attached N/A N/A Wound Margin: Large (67-100%) N/A N/A Granulation A mount: Red, Pink N/A N/A Granulation Quality: Small (1-33%) N/A N/A Necrotic A mount: Fat Layer (Subcutaneous Tissue): Yes N/A  N/A Exposed Structures: Fascia: No Tendon: No Muscle: No Joint: No Bone: No Small (1-33%) N/A N/A Epithelialization: Excoriation: No N/A N/A Periwound Skin Texture: Induration: No Callus: No Crepitus: No Rash: No Scarring: No Maceration: No N/A N/A Periwound Skin Moisture: Dry/Scaly: No Atrophie Blanche: No N/A N/A Periwound Skin Color: Cyanosis: No Ecchymosis: No Erythema: No Hemosiderin Staining: No Mottled: No Pallor: No Rubor: No Treatment Notes Wound #1 (Achilles) Wound Laterality: Left Cleanser Peri-Wound Care Topical Primary Dressing Promogran Prisma Matrix, 4.34 (sq in) (silver collagen) Discharge Instruction: Moisten collagen with hydrogel. apply only wound center. Secondary Dressing Woven Gauze Sponge, Non-Sterile 4x4 in Discharge Instruction: Apply over primary dressing as directed. Secured With Elastic Bandage 4 inch (ACE bandage) Discharge Instruction: Secure with ACE bandage as directed. Kerlix Roll Sterile, 4.5x3.1 (in/yd) Discharge Instruction: Secure with Kerlix as directed. 65M Medipore H Soft Cloth Surgical T ape, 4 x 10 (in/yd) Discharge Instruction: Secure with tape as directed. Compression Wrap Compression Stockings Add-Ons Electronic Signature(s) Signed: 05/05/2023 7:25:57 AM By: [name] MD Entered By: [name] on 05/04/2023 08:51:54 -------------------------------------------------------------------------------- Multi-Disciplinary Care Plan Details Patient Name: Date of Service: [name], [name] [name] 05/04/2023 10:45 A [name] ([id number]) [id number] Page 6 of 10 Medical Record Number: [id number] Patient Account Number: [id number] Date of Birth/Sex: Treating RN: 06/17/1966 (57 y.o. [name], [name] Primary Care [name]: [name]: Referring [name]: Treating [name]/Extender: [name] in Treatment: 18 Active  Inactive HBO Nursing Diagnoses: Potential for barotraumas to ears, sinuses, teeth, and lungs or cerebral gas embolism related to changes in atmospheric pressure inside hyperbaric oxygen chamber Potential for oxygen toxicity seizures related to delivery of 100% oxygen at an increased atmospheric pressure Potential for pulmonary oxygen toxicity related to delivery of 100% oxygen at an increased atmospheric pressure Goals: Patient and/or family will be able to state/discuss factors appropriate to the management of their disease process during treatment Date Initiated: 02/16/2023 Target Resolution Date: 07/14/2023 Goal Status: Active Patient will tolerate the hyperbaric oxygen therapy treatment Date Initiated: 02/16/2023 Target Resolution Date: 07/14/2023 Goal Status: Active Patient/caregiver will verbalize understanding of HBO goals, rationale, procedures and potential hazards Date Initiated: 02/16/2023 Target Resolution Date: 07/14/2023 Goal Status: Active Interventions: Administer the correct therapeutic gas delivery based on the patients needs and limitations, per physician order Assess and provide for patients comfort related to the hyperbaric environment and equalization of middle ear Assess for signs and symptoms  [name], [name] ([id number]) [id number] Page 1 of 10 Visit Report for 05/04/2023 Arrival Information Details Patient Name: Date of Service: [name], [name] 05/04/2023 10:45 A M Medical Record Number: [id number] Patient Account Number: [id number] Date of Birth/Sex: Treating RN: Apr 04, 1966 (57 y.o. [name], [name] Primary Care Anah Billard: [name]: Referring Emali Heyward: Treating Jacinta Penalver/Extender: [name] in Treatment: 18 Visit Information History Since Last Visit Added or deleted any medications: No Patient Arrived: Ambulatory Any new allergies or adverse reactions: No Arrival Time: 10:42 Had a fall or experienced change in No Accompanied By: self activities of daily living that may affect Transfer Assistance: None risk of falls: Patient Identification Verified: Yes Signs or symptoms of abuse/neglect since last visito No Secondary Verification Process Completed: Yes Hospitalized since last visit: No Patient Requires Transmission-Based Precautions: No Implantable device outside of the clinic excluding No Patient Has Alerts: No cellular tissue based products placed in the center since last visit: Has Dressing in Place as Prescribed: Yes Pain Present Now: No Electronic Signature(s) Signed: 05/04/2023 4:40:44 PM By: [name] Stall RN, BSN Entered By: [name] on 05/04/2023 07:43:01 -------------------------------------------------------------------------------- Clinic Level of Care Assessment Details Patient Name: Date of Service: [name], [name] [name] 05/04/2023 10:45 A M Medical Record Number: [id number] Patient Account Number: [id number] Date of Birth/Sex: Treating RN: 31-Aug-1965 (57 y.o. [name] Primary Care Elowen Debruyn: [name]: Referring Brexlee Heberlein: Treating Grettel Rames/Extender: [name] in Treatment: 18 Clinic Level of Care Assessment  Items TOOL 4 Quantity Score X- 1 0 Use when only an EandM is performed on FOLLOW-UP visit ASSESSMENTS - Nursing Assessment / Reassessment X- 1 10 Reassessment of Co-morbidities (includes updates in patient status) X- 1 5 Reassessment of Adherence to Treatment Plan ASSESSMENTS - Wound and Skin A ssessment / Reassessment X - Simple Wound Assessment / Reassessment - one wound 1 5 []  - 0 Complex Wound Assessment / Reassessment - multiple wounds X- 1 10 Dermatologic / Skin Assessment (not related to wound area) ASSESSMENTS - Focused Assessment X- 1 5 Circumferential Edema Measurements - multi extremities []  - 0 Nutritional Assessment / Counseling / Intervention BRODEY, BONN ([id number]) [id number] Page 2 of 10 []  - 0 Lower Extremity Assessment (monofilament, tuning fork, pulses) []  - 0 Peripheral Arterial Disease Assessment (using hand held doppler) ASSESSMENTS - Ostomy and/or Continence Assessment and Care []  - 0 Incontinence Assessment and Management []  - 0 Ostomy Care Assessment and Management (repouching, etc.) PROCESS - Coordination of Care X - Simple Patient / Family Education for ongoing care 1 15 []  - 0 Complex (extensive) Patient / Family Education for ongoing care X- 1 10 Staff obtains Chiropractor, Records, T Results / Process Orders est []  - 0 Staff telephones HHA, Nursing Homes / Clarify orders / etc []  - 0 Routine Transfer to another Facility (non-emergent condition) []  - 0 Routine Hospital Admission (non-emergent condition) []  - 0 New Admissions / Manufacturing engineer / Ordering NPWT Apligraf, etc. , []  - 0 Emergency Hospital Admission (emergent condition) X- 1 10 Simple Discharge Coordination []  - 0 Complex (extensive) Discharge Coordination PROCESS - Special Needs []  - 0 Pediatric / Minor Patient Management []  - 0 Isolation Patient Management []  - 0 Hearing / Language / Visual special needs []  - 0 Assessment of  Community assistance (transportation, D/C planning, etc.) []  - 0 Additional assistance / Altered mentation []  - 0 Support Surface(s) Assessment (bed, cushion, seat, etc.) INTERVENTIONS - Wound Cleansing / Measurement X - Simple  Large (67-100%) Exposed Structure Granulation Quality: Red, Pink Fascia Exposed: No Necrotic Amount: Small (1-33%) Fat Layer (Subcutaneous Tissue) Exposed: Yes Necrotic Quality: Adherent Slough Tendon Exposed: No Muscle Exposed: No Joint Exposed: No Bone Exposed: No Periwound Skin Texture Texture Color No Abnormalities Noted: No No Abnormalities Noted: No [name], [name] ([id number]) [id number] Page 9 of 10 Callus: No Atrophie Blanche: No Crepitus: No Cyanosis: No Excoriation: No Ecchymosis: No Induration: No Erythema: No Rash: No Hemosiderin Staining: No Scarring: No Mottled: No Pallor: No Moisture Rubor: No No Abnormalities Noted: No Dry / Scaly: No Maceration: No Treatment Notes Wound #1 (Achilles) Wound Laterality: Left Cleanser Peri-Wound Care Topical Primary Dressing Promogran Prisma Matrix, 4.34 (sq in) (silver collagen) Discharge Instruction: Moisten collagen with hydrogel. apply only wound center. Secondary Dressing Woven Gauze Sponge, Non-Sterile 4x4 in Discharge Instruction: Apply over primary dressing as directed. Secured With Elastic Bandage 4 inch (ACE bandage) Discharge Instruction: Secure with ACE bandage as directed. Kerlix Roll Sterile, 4.5x3.1 (in/yd) Discharge Instruction: Secure with Kerlix as directed. 49M Medipore H Soft Cloth Surgical T ape, 4 x 10 (in/yd) Discharge Instruction: Secure with tape as directed. Compression Wrap Compression Stockings Add-Ons Electronic Signature(s) Signed: 05/04/2023 2:03:22 PM By: [name] Signed: 05/04/2023 4:40:44 PM By: [name] Stall RN, BSN Entered By: [name] on 05/04/2023  07:49:46 -------------------------------------------------------------------------------- Vitals Details Patient Name: Date of Service: [name], [name] [name] 05/04/2023 10:45 A M Medical Record Number: [id number] Patient Account Number: [id number] Date of Birth/Sex: Treating RN: 1966/07/15 (57 y.o. [name]: [name]: Referring [name]: Treating [name]/Extender: [name] in Treatment: 18 Vital Signs Time Taken: 10:43 Temperature (F): 98.1 Height (in): 68 Pulse (bpm): 64 Weight (lbs): 200 Respiratory Rate (breaths/min): 18 Body Mass Index (BMI): 30.4 Blood Pressure (mmHg): 123/67 Capillary Blood Glucose (mg/dl): 91 Reference Range: 80 - 120 mg / dl Electronic Signature(s) [name], [name] ([id number]) [id number] Page 10 of 10 Signed: 05/04/2023 4:40:44 PM By: [name] Stall RN, BSN Entered By: [name] on 05/04/2023 07:43:21

## 2023-05-05 NOTE — Progress Notes (Signed)
[name], [name] ([id number]) [id number] Page 1 of 1 Visit Report for 05/05/2023 SuperBill Details Patient Name: Date of Service: [name], [name] 05/05/2023 Medical Record Number: [id number] Patient Account Number: [id number] Date of Birth/Sex: Treating RN: October 14, 1965 (57 y.o. 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 Facility Procedures CPT4 Code Description Modifier Quantity [id number]-(Facility Use Only) HBOT full body chamber, , 4 ICD-10 Diagnosis Description 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 Physician Procedures Quantity CPT4 Code Description Modifier [id number] - WC PHYS HYPERBARIC OXYGEN THERAPY 1 ICD-10 Diagnosis Description 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

## 2023-05-05 NOTE — Progress Notes (Addendum)
KALMAN, LOUALLEN ([id number]) [id number] Page 1 of 2 Visit Report for 05/05/2023 HBO Details Patient Name: Date of Service: [place], [name] 05/05/2023 7:30 A M Medical Record Number: [id number] Patient Account Number: [id number] Date of Birth/Sex: Treating RN: [birth year] (57 y.o. [name], [name] Primary Care Aniston Christman: [name] Other Clinician: [name] Referring Mechell Girgis: Treating Reshonda Koerber/Extender: Theodis Sato in Treatment: 18 HBO Treatment Course Details Treatment Course Number: 1 Ordering Ora Mcnatt: Geralyn Corwin T Treatments Ordered: otal 60 HBO Treatment Start Date: 02/16/2023 HBO Indication: Other (specify in Notes) Notes: Acute peripheral arterial insufficiency HBO Treatment Details Treatment Number: 46 Patient Type: Outpatient Chamber Type: Monoplace Chamber Serial #: [id number] Treatment Protocol: 2.0 ATA with 90 minutes oxygen, with two 5 minute air breaks Treatment Details Compression Rate Down: 1.5 psi / minute De-Compression Rate Up: 2.0 psi / minute A breaks and breathing ir Compress Tx Pressure periods Decompress Decompress Begins Reached (leave unused spaces Begins Ends blank) Chamber Pressure (ATA 1 2 2 2 2 2  --2 1 ) Clock Time (24 hr) 08:17 08:28 08:58 09:03 09:33 09:38 - - 10:08 10:18 Treatment Length: 121 (minutes) Treatment Segments: 4 Vital Signs Capillary Blood Glucose Reference Range: 80 - 120 mg / dl HBO Diabetic Blood Glucose Intervention Range: <131 mg/dl or >086 mg/dl Type: Time Vitals Blood Respiratory Capillary Blood Glucose Pulse Action Pulse: Temperature: Taken: Pressure: Rate: Glucose (mg/dl): Meter #: Oximetry (%) Taken: Pre 07:54 127/55 67 18 97.5 139 none per protocol Post 10:23 128/63 68 18 97.3 192 none per protocol Treatment Response Treatment Toleration: Well Treatment Completion Status: Treatment Completed without Adverse Event Treatment Notes Mr. Hermoso  arrived with vital signs within normal range except for diastolic BP at 55 mmHg. He was asymptomatic for hypotension. After performing a safety check, he was placed in the chamber which was compressed with 100% oxygen at a rate of 2 psi/min after confirming normal ear equalization. He tolerated the treatment and subsequent decompression of the chamber at a rate of 2 psi/min. His post-treatment vital signs were within normal range. He was stable upon discharge to wound care for visit. Izzabell Klasen Notes No concerns with treatment given Physician HBO Attestation: I certify that I supervised this HBO treatment in accordance with Medicare guidelines. A trained emergency response team is readily available per Yes hospital policies and procedures. Continue HBOT as ordered. Yes Electronic Signature(s) Signed: 05/05/2023 4:26:34 PM By: [name] MD Previous Signature: 05/05/2023 12:36:07 PM Version By: [name] CHT EMT BS , , Entered By: [name] on 05/05/2023 16:23:23 [name] ([id number]) [id number] Page 2 of 2 -------------------------------------------------------------------------------- HBO Safety Checklist Details Patient Name: Date of Service: [place], [name] 05/05/2023 7:30 A M Medical Record Number: [id number] Patient Account Number: [id number] Date of Birth/Sex: Treating RN: 03-01-1966 (57 y.o. [name], [name] Primary Care Verlie Liotta: [name] Other Clinician: [name] Referring Tanee Henery: Treating Hyder Deman/Extender: Theodis Sato in Treatment: 18 HBO Safety Checklist Items Safety Checklist Consent Form Signed Patient voided / foley secured and emptied 0700 - SEC Biscuit, coffee, protein bar, When did you last eato protein shake Last dose of injectable or oral agent 0630 - 17 units Lantus Ostomy pouch emptied and vented if applicable NA All implantable devices assessed, documented and  approved Freestyle Libre 2 and3 on approved list Intravenous access site secured and place NA Valuables secured Linens and cotton and cotton/polyester blend (less than 51% polyester) Personal oil-based products / skin lotions / body lotions removed Wigs or  hairpieces removed NA Smoking or tobacco materials removed NA Books / newspapers / magazines / loose paper removed Cologne, aftershave, perfume and deodorant removed Jewelry removed (may wrap wedding band) Make-up removed NA Hair care products removed Battery operated devices (external) removed Heating patches and chemical warmers removed Titanium eyewear removed Nail polish cured greater than 10 hours NA Casting material cured greater than 10 hours NA Hearing aids removed NA Loose dentures or partials removed NA Prosthetics have been removed NA Patient demonstrates correct use of air break device (if applicable) Patient concerns have been addressed Patient grounding bracelet on and cord attached to chamber Specifics for Inpatients (complete in addition to above) Medication sheet sent with patient NA Intravenous medications needed or due during therapy sent with patient NA Drainage tubes (e.g. nasogastric tube or chest tube secured and vented) NA Endotracheal or Tracheotomy tube secured NA Cuff deflated of air and inflated with saline NA Airway suctioned NA Notes Paper version used prior to treatment start. Electronic Signature(s) Signed: 05/05/2023 12:29:38 PM By: [name] CHT EMT BS , , Entered By: [name] on 05/05/2023 12:29:37

## 2023-05-06 ENCOUNTER — Ambulatory Visit (INDEPENDENT_AMBULATORY_CARE_PROVIDER_SITE_OTHER): Payer: 59 | Admitting: Family

## 2023-05-06 ENCOUNTER — Encounter (HOSPITAL_BASED_OUTPATIENT_CLINIC_OR_DEPARTMENT_OTHER): Payer: 59 | Admitting: Physician Assistant

## 2023-05-06 DIAGNOSIS — L97421 Non-pressure chronic ulcer of left heel and midfoot limited to breakdown of skin: Secondary | ICD-10-CM

## 2023-05-06 DIAGNOSIS — E10621 Type 1 diabetes mellitus with foot ulcer: Secondary | ICD-10-CM | POA: Diagnosis not present

## 2023-05-06 DIAGNOSIS — L97423 Non-pressure chronic ulcer of left heel and midfoot with necrosis of muscle: Secondary | ICD-10-CM

## 2023-05-06 LAB — GLUCOSE, CAPILLARY
Glucose-Capillary: 167 mg/dL — ABNORMAL HIGH (ref 70–99)
Glucose-Capillary: 173 mg/dL — ABNORMAL HIGH (ref 70–99)

## 2023-05-06 MED ORDER — LEVOFLOXACIN 750 MG PO TABS: 750.0000 mg | ORAL_TABLET | Freq: Every day | ORAL | 0 refills | Status: DC

## 2023-05-06 NOTE — Progress Notes (Signed)
BONIFACE, ROTUNNO ([id number]) [id number] Page 1 of 2 Visit Report for 05/06/2023 HBO Details Patient Name: Date of Service: [place], [name] 05/06/2023 7:30 A M Medical Record Number: [id number] Patient Account Number: [id number] Date of Birth/Sex: Treating RN: Mar 08, 1966 (57 y.o. [name] Primary Care Dyasia Firestine: [name] Other Clinician: [name] Referring Malon Siddall: Treating Derk Doubek/Extender: [name] in Treatment: 18 HBO Treatment Course Details Treatment Course Number: 1 Ordering Helyn Schwan: Geralyn Corwin T Treatments Ordered: otal 60 HBO Treatment Start Date: 02/16/2023 HBO Indication: Other (specify in Notes) Notes: Acute peripheral arterial insufficiency HBO Treatment Details Treatment Number: 47 Patient Type: Outpatient Chamber Type: Monoplace Chamber Serial #: [id number] Treatment Protocol: 2.0 ATA with 90 minutes oxygen, with two 5 minute air breaks Treatment Details Compression Rate Down: 1.5 psi / minute De-Compression Rate Up: 2.0 psi / minute A breaks and breathing ir Compress Tx Pressure periods Decompress Decompress Begins Reached (leave unused spaces Begins Ends blank) Chamber Pressure (ATA 1 2 2 2 2 2  --2 1 ) Clock Time (24 hr) 08:08 08:19 08:49 08:54 09:24 09:29 - - 09:59 10:09 Treatment Length: 121 (minutes) Treatment Segments: 4 Vital Signs Capillary Blood Glucose Reference Range: 80 - 120 mg / dl HBO Diabetic Blood Glucose Intervention Range: <131 mg/dl or >244 mg/dl Type: Time Vitals Blood Respiratory Capillary Blood Glucose Pulse Action Pulse: Temperature: Taken: Pressure: Rate: Glucose (mg/dl): Meter #: Oximetry (%) Taken: Pre 07:51 128/62 71 18 97.3 167 none per protocol Post 10:12 131/63 70 18 97.2 173 none per protocol Treatment Response Treatment Toleration: Well Treatment Completion Status: Treatment Completed without Adverse Event Treatment Notes Mr.  Barnish arrived with normal vital signs, stating that he ate breakfast, giving details of that meal. He prepared for treatment. After performing a safety check, he was placed in the chamber which was compressed with 100% oxygen at a rate of 2 psi/min after confirming normal ear equalization. He tolerated the treatment and subsequent decompression of the chamber at the rate of 2 psi/min. His post-treatment vital signs were within normal range. He was stable upon discharge. Electronic Signature(s) Signed: 05/06/2023 10:59:34 AM By: [name] CHT EMT BS , , Signed: 05/06/2023 5:35:47 PM By: Allen Derry PA-C Previous Signature: 05/06/2023 8:46:49 AM Version By: [name] CHT EMT BS , , Entered By: [name] on 05/06/2023 07:59:33 Double, [name] ([id number]) [id number] Page 2 of 2 -------------------------------------------------------------------------------- HBO Safety Checklist Details Patient Name: Date of Service: [place], [name] 05/06/2023 7:30 A M Medical Record Number: [id number] Patient Account Number: [id number] Date of Birth/Sex: Treating RN: April 27, 1966 (57 y.o. [name] Primary Care Raeleen Winstanley: [name] Other Clinician: [name] Referring Berry Gallacher: Treating Smayan Hackbart/Extender: [name] in Treatment: 18 HBO Safety Checklist Items Safety Checklist Consent Form Signed Patient voided / foley secured and emptied 0700- SEC biscuit, protein shake, collagen When did you last eato powder, coffee Last dose of injectable or oral agent 0630- 17 units Lantus Ostomy pouch emptied and vented if applicable NA All implantable devices assessed, documented and approved Freestyle Libre 2 and3 on approved list Intravenous access site secured and place NA Valuables secured Linens and cotton and cotton/polyester blend (less than 51% polyester) Personal oil-based products / skin lotions /  body lotions removed Wigs or hairpieces removed NA Smoking or tobacco materials removed NA Books / newspapers / magazines / loose paper removed Cologne, aftershave, perfume and deodorant removed Jewelry removed (may wrap wedding band) Make-up removed NA Hair care  products removed Battery operated devices (external) removed Heating patches and chemical warmers removed Titanium eyewear removed Nail polish cured greater than 10 hours NA Casting material cured greater than 10 hours NA Hearing aids removed NA Loose dentures or partials removed NA Prosthetics have been removed NA Patient demonstrates correct use of air break device (if applicable) Patient concerns have been addressed Patient grounding bracelet on and cord attached to chamber Specifics for Inpatients (complete in addition to above) Medication sheet sent with patient NA Intravenous medications needed or due during therapy sent with patient NA Drainage tubes (e.g. nasogastric tube or chest tube secured and vented) NA Endotracheal or Tracheotomy tube secured NA Cuff deflated of air and inflated with saline NA Airway suctioned NA Notes Paper version used prior to treatment start. Electronic Signature(s) Signed: 05/06/2023 8:44:03 AM By: [name] CHT EMT BS , , Entered By: [name] on 05/06/2023 05:44:03

## 2023-05-06 NOTE — Progress Notes (Addendum)
AYUB, KIRSH ([id number]) [id number] Page 1 of 2 Visit Report for 05/06/2023 Arrival Information Details Patient Name: Date of Service: [place], [name] 05/06/2023 7:30 A M Medical Record Number: [id number] Patient Account Number: [id number] Date of Birth/Sex: Treating RN: Dec 05, 1965 (57 y.o. [name] Primary Care Jaria Conway: [name] Other Clinician: [name] Referring Jaylani Mcguinn: Treating Dwaine Pringle/Extender: [name] in Treatment: 18 Visit Information History Since Last Visit All ordered tests and consults were completed: Yes Patient Arrived: Ambulatory Added or deleted any medications: No Arrival Time: 07:44 Any new allergies or adverse reactions: No Accompanied By: self Had a fall or experienced change in No Transfer Assistance: None activities of daily living that may affect Patient Identification Verified: Yes risk of falls: Secondary Verification Process Completed: Yes Signs or symptoms of abuse/neglect since last visito No Patient Requires Transmission-Based Precautions: No Hospitalized since last visit: No Patient Has Alerts: No Implantable device outside of the clinic excluding No cellular tissue based products placed in the center since last visit: Pain Present Now: No Electronic Signature(s) Signed: 05/06/2023 8:38:43 AM By: [name] CHT EMT BS , , Entered By: [name] on 05/06/2023 05:38:43 -------------------------------------------------------------------------------- Encounter Discharge Information Details Patient Name: Date of Service: [name] Pont, [name] [name] 05/06/2023 7:30 A M Medical Record Number: [id number] Patient Account Number: [id number] Date of Birth/Sex: Treating RN: 04-04-1966 (57 y.o. [name] Primary Care Janet Decesare: [name] Other Clinician: [name] Referring Eldrick Penick: Treating Griffin Dewilde/Extender: [name] in Treatment: 18 Encounter Discharge Information Items Discharge Condition: Stable Ambulatory Status: Ambulatory Discharge Destination: Home Transportation: Private Auto Accompanied By: self Schedule Follow-up Appointment: No Clinical Summary of Care: Electronic Signature(s) Signed: 05/06/2023 1:03:23 PM By: [name] CHT EMT BS , , Entered By: [name] on 05/06/2023 10:03:23 [name] ([id number]) [id number] Page 2 of 2 -------------------------------------------------------------------------------- Vitals Details Patient Name: Date of Service: [name], [name] 05/06/2023 7:30 A M Medical Record Number: [id number] Patient Account Number: [id number] Date of Birth/Sex: Treating RN: 1965/11/15 (57 y.o. [name] Primary Care Hasaan Radde: [name] Other Clinician: [name] Referring Ulas Zuercher: Treating Elia Nunley/Extender: [name] in Treatment: 18 Vital Signs Time Taken: 07:51 Temperature (F): 97.3 Height (in): 68 Pulse (bpm): 71 Weight (lbs): 200 Respiratory Rate (breaths/min): 18 Body Mass Index (BMI): 30.4 Blood Pressure (mmHg): 128/62 Capillary Blood Glucose (mg/dl): 601 Reference Range: 80 - 120 mg / dl Electronic Signature(s) Signed: 05/06/2023 8:40:44 AM By: [name] CHT EMT BS , , Entered By: [name] on 05/06/2023 05:40:44

## 2023-05-07 ENCOUNTER — Encounter (HOSPITAL_BASED_OUTPATIENT_CLINIC_OR_DEPARTMENT_OTHER): Payer: 59 | Admitting: Internal Medicine

## 2023-05-07 NOTE — Progress Notes (Signed)
CAPTAIN, BLUCHER ([id number]) [id number] Page 1 of 1 Visit Report for 05/06/2023 SuperBill Details Patient Name: Date of Service: [place], [name] 05/06/2023 Medical Record Number: [id number] Patient Account Number: [id number] Date of Birth/Sex: Treating RN: 1965/10/04 (57 y.o. 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 Facility Procedures CPT4 Code Description Modifier Quantity [id number] G0277-(Facility Use Only) HBOT full body chamber, , 4 ICD-10 Diagnosis Description 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 Physician Procedures Quantity CPT4 Code Description Modifier [id number] 99183 - WC PHYS HYPERBARIC OXYGEN THERAPY 1 ICD-10 Diagnosis Description 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

## 2023-05-08 ENCOUNTER — Encounter (HOSPITAL_BASED_OUTPATIENT_CLINIC_OR_DEPARTMENT_OTHER): Payer: 59 | Admitting: General Surgery

## 2023-05-08 ENCOUNTER — Encounter: Payer: Self-pay | Admitting: Family

## 2023-05-08 DIAGNOSIS — E10621 Type 1 diabetes mellitus with foot ulcer: Secondary | ICD-10-CM | POA: Diagnosis not present

## 2023-05-08 LAB — GLUCOSE, CAPILLARY
Glucose-Capillary: 194 mg/dL — ABNORMAL HIGH (ref 70–99)
Glucose-Capillary: 187 mg/dL — ABNORMAL HIGH (ref 70–99)

## 2023-05-08 NOTE — Progress Notes (Addendum)
IZAIA, SAY ([id number]) [id number] Page 1 of 2 Visit Report for 05/08/2023 Arrival Information Details Patient Name: Date of Service: [place], [name] 05/08/2023 7:30 A M Medical Record Number: [id number] Patient Account Number: [id number] Date of Birth/Sex: Treating RN: [birth year] (57 y.o. [name], [name] Primary Care Dejion Grillo: [name] Other Clinician: [name] Referring Miller Edgington: Treating Deontez Klinke/Extender: Marena Chancy in Treatment: 19 Visit Information History Since Last Visit All ordered tests and consults were completed: Yes Patient Arrived: Ambulatory Added or deleted any medications: No Arrival Time: 07:32 Any new allergies or adverse reactions: No Accompanied By: self Had a fall or experienced change in No Transfer Assistance: None activities of daily living that may affect Patient Identification Verified: Yes risk of falls: Secondary Verification Process Completed: Yes Signs or symptoms of abuse/neglect since last visito No Patient Requires Transmission-Based Precautions: No Hospitalized since last visit: No Patient Has Alerts: No Implantable device outside of the clinic excluding No cellular tissue based products placed in the center since last visit: Pain Present Now: No Electronic Signature(s) Signed: 05/08/2023 9:51:12 AM By: [name] CHT EMT BS , , Entered By: [name] on 05/08/2023 06:51:11 -------------------------------------------------------------------------------- Encounter Discharge Information Details Patient Name: Date of Service: [name], [name] 05/08/2023 7:30 A M Medical Record Number: [id number] Patient Account Number: [id number] Date of Birth/Sex: Treating RN: 08/17/1965 (57 y.o. [name] Primary Care Hawkins Seaman: [name] Other Clinician: [name] Referring Starlett Pehrson: Treating Kianni Lheureux/Extender: Marena Chancy in Treatment: 19 Encounter Discharge Information Items Discharge Condition: Stable Ambulatory Status: Ambulatory Discharge Destination: Home Transportation: Private Auto Accompanied By: self Schedule Follow-up Appointment: No Clinical Summary of Care: Electronic Signature(s) Signed: 05/08/2023 11:13:39 AM By: [name] CHT EMT BS , , Entered By: [name] on 05/08/2023 08:13:38 [name] ([id number]) [id number] Page 2 of 2 -------------------------------------------------------------------------------- Vitals Details Patient Name: Date of Service: [place], [name] 05/08/2023 7:30 A M Medical Record Number: [id number] Patient Account Number: [id number] Date of Birth/Sex: Treating RN: Mar 21, 1966 (57 y.o. [name], [name] Primary Care Vista Sawatzky: [name] Other Clinician: [name] Referring Candi Profit: Treating Willetta York/Extender: Marena Chancy in Treatment: 19 Vital Signs Time Taken: 07:44 Temperature (F): 97.7 Height (in): 68 Pulse (bpm): 74 Weight (lbs): 200 Respiratory Rate (breaths/min): 18 Body Mass Index (BMI): 30.4 Blood Pressure (mmHg): 133/70 Capillary Blood Glucose (mg/dl): 762 Reference Range: 80 - 120 mg / dl Electronic Signature(s) Signed: 05/08/2023 9:51:34 AM By: [name] CHT EMT BS , , Entered By: [name] on 05/08/2023 06:51:34

## 2023-05-08 NOTE — Progress Notes (Signed)
KEYMANI, MCLEAN ([id number]) [id number] Page 1 of 1 Visit Report for 05/08/2023 SuperBill Details Patient Name: Date of Service: [place], [name] 05/08/2023 Medical Record Number: [id number] Patient Account Number: [id number] Date of Birth/Sex: Treating RN: Nov 03, 1965 (57 y.o. 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 Facility Procedures CPT4 Code Description Modifier Quantity [id number] G0277-(Facility Use Only) HBOT full body chamber, , 4 ICD-10 Diagnosis Description 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 Physician Procedures Quantity CPT4 Code Description Modifier [id number] 99183 - WC PHYS HYPERBARIC OXYGEN THERAPY 1 ICD-10 Diagnosis Description 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

## 2023-05-08 NOTE — Progress Notes (Addendum)
MAHDY, LONSDALE ([id number]) [id number] Page 1 of 2 Visit Report for 05/08/2023 HBO Details Patient Name: Date of Service: [place], [name] 05/08/2023 7:30 A M Medical Record Number: [id number] Patient Account Number: [id number] Date of Birth/Sex: Treating RN: 1965-12-25 (57 y.o. [name], [name] Primary Care Royalty Fakhouri: [name] Other Clinician: [name] Referring Evona Westra: Treating Marcelus Dubberly/Extender: Marena Chancy in Treatment: 19 HBO Treatment Course Details Treatment Course Number: 1 Ordering Farrie Sann: Geralyn Corwin T Treatments Ordered: otal 60 HBO Treatment Start Date: 02/16/2023 HBO Indication: Other (specify in Notes) Notes: Acute peripheral arterial insufficiency HBO Treatment Details Treatment Number: 48 Patient Type: Outpatient Chamber Type: Monoplace Chamber Serial #: [id number] Treatment Protocol: 2.0 ATA with 90 minutes oxygen, with two 5 minute air breaks Treatment Details Compression Rate Down: 1.5 psi / minute De-Compression Rate Up: 2.0 psi / minute A breaks and breathing ir Compress Tx Pressure periods Decompress Decompress Begins Reached (leave unused spaces Begins Ends blank) Chamber Pressure (ATA 1 2 2 2 2 2  --2 1 ) Clock Time (24 hr) 08:09 08:18 08:48 08:53 09:24 09:28 - - 09:58 10:08 Treatment Length: 119 (minutes) Treatment Segments: 4 Vital Signs Capillary Blood Glucose Reference Range: 80 - 120 mg / dl HBO Diabetic Blood Glucose Intervention Range: <131 mg/dl or >621 mg/dl Type: Time Vitals Blood Respiratory Capillary Blood Glucose Pulse Action Pulse: Temperature: Taken: Pressure: Rate: Glucose (mg/dl): Meter #: Oximetry (%) Taken: Pre 07:44 133/70 74 18 97.7 194 none per protocol Post 10:11 114/60 69 18 97.8 187 none per protocol Treatment Response Treatment Toleration: Well Treatment Completion Status: Treatment Completed without Adverse Event Treatment Notes Mr.  Quinney arrived with normal vital signs, stating that he ate breakfast, giving details of his meal. He prepared for treatment. After performing a safety check, he was placed in the chamber which was compressed with 100% oxygen at a rate of 2 psi/min after confirming normal ear equalization. He tolerated the treatment and subsequent decompression of the chamber at the rate of 2 psi/min. His post-treatment vital signs were within normal range. He was stable upon discharge. Physician HBO Attestation: I certify that I supervised this HBO treatment in accordance with Medicare guidelines. A trained emergency response team is readily available per Yes hospital policies and procedures. Continue HBOT as ordered. Yes Electronic Signature(s) Signed: 05/08/2023 12:20:59 PM By: Duanne Guess MD FACS Previous Signature: 05/08/2023 11:12:13 AM Version By: [name] CHT EMT BS , , Previous Signature: 05/08/2023 11:11:42 AM Version By: [name] CHT EMT BS , , Previous Signature: 05/08/2023 10:50:13 AM Version By: [name] CHT EMT BS , , Entered By: Duanne Guess on 05/08/2023 12:20:59 [name] ([id number]) [id number] Page 2 of 2 -------------------------------------------------------------------------------- HBO Safety Checklist Details Patient Name: Date of Service: [place], [name] 05/08/2023 7:30 A M Medical Record Number: [id number] Patient Account Number: [id number] Date of Birth/Sex: Treating RN: February 29, 1966 (57 y.o. [name] Primary Care Win Guajardo: [name] Other Clinician: Karl Bales Referring Canyon Willow: Treating Kensley Valladares/Extender: Marena Chancy in Treatment: 19 HBO Safety Checklist Items Safety Checklist Consent Form Signed Patient voided / foley secured and emptied 0700 - Egg/Cheese Sandwich, Protein Shake, When did you last eato Coffee Last dose of injectable or oral agent 0630 17  Units Lantus Ostomy pouch emptied and vented if applicable NA All implantable devices assessed, documented and approved Freestyle Libre 2 and3 on approved list Intravenous access site secured and place NA Valuables secured Linens and cotton and cotton/polyester blend (less than  51% polyester) Personal oil-based products / skin lotions / body lotions removed Wigs or hairpieces removed NA Smoking or tobacco materials removed NA Books / newspapers / magazines / loose paper removed Cologne, aftershave, perfume and deodorant removed Jewelry removed (may wrap wedding band) Make-up removed NA Hair care products removed Battery operated devices (external) removed Heating patches and chemical warmers removed Titanium eyewear removed Nail polish cured greater than 10 hours NA Casting material cured greater than 10 hours NA Hearing aids removed NA Loose dentures or partials removed NA Prosthetics have been removed NA Patient demonstrates correct use of air break device (if applicable) Patient concerns have been addressed Patient grounding bracelet on and cord attached to chamber Specifics for Inpatients (complete in addition to above) Medication sheet sent with patient NA Intravenous medications needed or due during therapy sent with patient NA Drainage tubes (e.g. nasogastric tube or chest tube secured and vented) NA Endotracheal or Tracheotomy tube secured NA Cuff deflated of air and inflated with saline NA Airway suctioned NA Notes Paper version used prior to treatment start. Electronic Signature(s) Signed: 05/08/2023 10:04:51 AM By: [name] CHT EMT BS , , Entered By: [name] on 05/08/2023 10:04:50

## 2023-05-08 NOTE — Progress Notes (Signed)
Like  Post-Op Visit Note   Patient: [name]           Date of Birth: 03-16-1966           MRN: [id number] Visit Date: 05/06/2023 PCP: Garlan Fillers, MD  Chief Complaint:  Chief Complaint  Patient presents with   Left Leg - Routine Post Op    03/04/23 left achilles debridement    HPI:  HPI The patient is a 57 year old gentleman who is seen in follow-up status post left Achilles debridement on July 31 he has had Kerecis micro powder applied in the office, donated, many times.  Last application was September 18.  He is concerned about some stalling perhaps even worsening of the wound.  Is concerned it may be deeper.  He also continues with home exercise program as well as hyperbaric oxygen treatments   Ortho Exam On examination of the left Achilles area of the ulcer is 100% filled in with granulation there is good uptake of the Kerecis powder.  There is no undermining.  The measurements today are 1 and half centimeters by 1.2 cm in width there is 3 mm of depth some mild surrounding erythema however appears to be healing circumferentially No erythema warmth or sign of infection  Donation Kerecis micro powder applied again today.  Visit Diagnoses: No diagnosis found.  Plan: Kerecis micro powder donation reapplied today.  Will change overlying gauze daily as instructed daily for the first week he will follow-up in the office in 1 more week for reevaluation  Follow-Up Instructions: No follow-ups on file.   Imaging: No results found.  Orders:  No orders of the defined types were placed in this encounter.  Meds ordered this encounter  Medications   levofloxacin (LEVAQUIN) 750 MG tablet    Sig: Take 1 tablet (750 mg total) by mouth daily.    Dispense:  10 tablet    Refill:  0     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) 03/04/2023   Hardware complicating wound infection (HCC)     Drug therapy 05/04/2020   Nondisplaced fracture of fifth left metatarsal bone with nonunion 03/22/2020   Osteopenia of multiple sites 02/28/2020   PVD (peripheral vascular disease) (HCC) 10/11/2019   Chronic venous insufficiency 10/05/2018   Diabetic cataract (HCC) 05/21/2018   Controlled type 1 diabetes mellitus with diabetic peripheral angiopathy without gangrene (HCC) 08/14/2017   Dyslipidemia 03/26/2017   High risk medication use 09/23/2016   History of hypertension 09/23/2016   History of chronic kidney disease 09/23/2016   History of coronary artery disease 09/23/2016   Tobacco abuse 09/23/2016   Primary osteoarthritis of both knees 09/23/2016   History of diabetes mellitus 09/23/2016   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 (HCC) 11/20/2015   Chest pain with high risk for cardiac etiology 10/29/2015   Asymptomatic bilateral carotid artery stenosis 06/08/2014   Pleural effusion, bilateral 06/03/2014   Dyspnea 06/01/2014   Diabetes (HCC) 05/03/2014   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/03/2014   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   Chronic renal disease, stage 3, moderately decreased glomerular filtration rate between 30-59 mL/min/1.73 square meter (HCC) 05/03/2014   CAD (coronary artery disease) 04/27/2014   Acne 12/19/2013   On isotretinoin therapy 12/19/2013   Nuclear sclerotic cataract, bilateral 12/19/2011   Vitreous hemorrhage (  HCC) 06/16/2011   Past Medical History:  Diagnosis Date   Anginal pain (HCC)    Anxiety    Arthritis    RA IN HANDS   Asthma    CAD (coronary artery disease) 04/27/2014   Chronic renal disease, stage 3, moderately decreased glomerular filtration rate between 30-59 mL/min/1.73 square meter (HCC) 05/03/2014   COPD (chronic obstructive pulmonary disease) (HCC)    Coronary artery disease    Diabetes (HCC) 05/03/2014   Diabetes mellitus  without complication (HCC)    type 1   Hyperlipidemia    Left shoulder pain    Peripheral vascular disease (HCC)    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/03/2014   Shortness of breath    Sleep apnea    mild OSA, no CPAP   Unstable angina pectoris (HCC) 04/25/2014    Family History  Problem Relation Age of Onset   Stomach cancer Mother    Cancer Mother    Prostate cancer Father    Heart disease Father    Cancer - Prostate Father    Heart disease Brother    Asthma Daughter    Healthy Daughter    Asthma Daughter     Past Surgical History:  Procedure Laterality Date   ABDOMINAL AORTOGRAM W/LOWER EXTREMITY N/A 01/15/2023   Procedure: ABDOMINAL AORTOGRAM W/LOWER EXTREMITY;  Surgeon: Cephus Shelling, MD;  Location: [facility] INVASIVE CV LAB;  Service: Cardiovascular;  Laterality: N/A;   CARDIAC CATHETERIZATION  04/25/2014   BY DR Jacinto Halim   CARDIAC CATHETERIZATION N/A 10/30/2015   Procedure: Left Heart Cath and Cors/Grafts Angiography;  Surgeon: Yates Decamp, MD;  Location[facility] INVASIVE CV LAB;  Service: Cardiovascular;  Laterality: N/A;   CARPAL TUNNEL RELEASE     CATARACT EXTRACTION, BILATERAL     CORONARY ARTERY BYPASS GRAFT N/A 04/27/2014   Procedure: CORONARY ARTERY BYPASS GRAFTING on pump using left internal mammary artery to LAD coronary artery, right great saphenous vein graft to diagonal coronary artery with sequential to OM1 and circumflex coronary arteries. Right greater saphenous vein graft to posterior descending coronary artery. ;  Surgeon: Delight Ovens, MD;  Location: [facility] OR;  Service: Open Heart Surgery;  Laterality: N   elbow drained Left 05/09/2019   ENDOVEIN HARVEST OF GREATER SAPHENOUS VEIN Right 04/27/2014   Procedure: ENDOVEIN HARVEST OF GREATER SAPHENOUS VEIN;  Surgeon: Delight Ovens, MD;  Location: [facility] OR;  Service: Open Heart Surgery;  Laterality: Right;   EXCISION ORAL TUMOR N/A 03/01/2018   Procedure: EXCISION ORAL TUMOR;  Surgeon: Christia Reading,  MD;  Location: [facility];  Service: ENT;  Laterality: N/A;   EYE SURGERY     LASER   EYE SURGERY Bilateral    astigmatism correction    HARDWARE REMOVAL Left 10/03/2020   Procedure: LEFT FOOT REMOVAL HARDWARE, PLACE VANC POWDER;  Surgeon: Nadara Mustard, MD;  Location: [facility] OR;  Service: Orthopedics;  Laterality: Left;   I & D EXTREMITY Left 03/04/2023   Procedure: LEFT ACHILLES DEBRIDEMENT;  Surgeon: Nadara Mustard, MD;  Location: [facility] OR;  Service: Orthopedics;  Laterality: Left;   INTRAOPERATIVE TRANSESOPHAGEAL ECHOCARDIOGRAM N/A 04/27/2014   Procedure: INTRAOPERATIVE TRANSESOPHAGEAL ECHOCARDIOGRAM;  Surgeon: Delight Ovens, MD;  Location: [facility] OR;  Service: Open Heart Surgery;  Laterality: N/A;   IR RADIOLOGIST EVAL & MGMT  12/22/2022   LEFT HEART CATHETERIZATION WITH CORONARY ANGIOGRAM N/A 04/25/2014   Procedure: LEFT HEART CATHETERIZATION WITH CORONARY ANGIOGRAM;  Surgeon: Pamella Pert, MD;  Location: [facility]  CATH LAB;  Service: Cardiovascular;  Laterality: N/A;   MOUTH SURGERY  11/15/2017   tongue surgery    ORIF TOE FRACTURE Left 03/22/2020   Procedure: OPEN REDUCTION INTERNAL FIXATION (ORIF) Non Union 5th Metatarsal;  Surgeon: Kathryne Hitch, MD;  Location: [facility];  Service: Orthopedics;  Laterality: Left;   Social History   Occupational History   Not on file  Tobacco Use   Smoking status: Former    Current packs/day: 0.25    Average packs/day: 0.3 packs/day for 34.0 years (8.5 ttl pk-yrs)    Types: Cigarettes   Smokeless tobacco: Never  Vaping Use   Vaping status: Never Used  Substance and Sexual Activity   Alcohol use: Not Currently    Comment: RARE   Drug use: No   Sexual activity: Not on file

## 2023-05-11 ENCOUNTER — Encounter (HOSPITAL_BASED_OUTPATIENT_CLINIC_OR_DEPARTMENT_OTHER): Payer: 59 | Admitting: Internal Medicine

## 2023-05-11 DIAGNOSIS — E10621 Type 1 diabetes mellitus with foot ulcer: Secondary | ICD-10-CM | POA: Diagnosis not present

## 2023-05-11 LAB — GLUCOSE, CAPILLARY
Glucose-Capillary: 166 mg/dL — ABNORMAL HIGH (ref 70–99)
Glucose-Capillary: 217 mg/dL — ABNORMAL HIGH (ref 70–99)

## 2023-05-11 NOTE — Progress Notes (Signed)
JOSEN, AMENTA ([id number]) [id number] Page 1 of 2 Visit Report for 05/11/2023 HBO Details Patient Name: Date of Service: [place], [name] 05/11/2023 7:30 A M Medical Record Number: [id number] Patient Account Number: [id number] Date of Birth/Sex: Treating RN: 1966/07/20 (57 y.o. [name] Primary Care Attikus Bartoszek: [name] Other Clinician: [name] Referring Taheem Fricke: Treating Kaliyan Osbourn/Extender: Theodis Sato in Treatment: 19 HBO Treatment Course Details Treatment Course Number: 1 Ordering Rosalynn Sergent: Geralyn Corwin T Treatments Ordered: otal 60 HBO Treatment Start Date: 02/16/2023 HBO Indication: Other (specify in Notes) Notes: Acute peripheral arterial insufficiency HBO Treatment Details Treatment Number: 49 Patient Type: Outpatient Chamber Type: Monoplace Chamber Serial #: [id number] Treatment Protocol: 2.0 ATA with 90 minutes oxygen, with two 5 minute air breaks Treatment Details Compression Rate Down: 1.5 psi / minute De-Compression Rate Up: A breaks and breathing ir Compress Tx Pressure periods Decompress Decompress Begins Reached (leave unused spaces Begins Ends blank) Chamber Pressure (ATA 1 2 2 2 2 2  --2 1 ) Clock Time (24 hr) 08:07 08:21 08:51 08:56 09:26 09:31 - - 10:01 10:11 Treatment Length: 124 (minutes) Treatment Segments: 4 Vital Signs Capillary Blood Glucose Reference Range: 80 - 120 mg / dl HBO Diabetic Blood Glucose Intervention Range: <131 mg/dl or >782 mg/dl Type: Time Vitals Blood Respiratory Capillary Blood Glucose Pulse Action Pulse: Temperature: Taken: Pressure: Rate: Glucose (mg/dl): Meter #: Oximetry (%) Taken: Pre 07:49 150/63 75 18 97.8 166 none per protocol Post 10:15 126/62 71 18 98.2 217 none per protocol Treatment Response Treatment Toleration: Well Treatment Completion Status: Treatment Completed without Adverse Event Treatment Notes Mr. Berteau arrived with  normal vital signs. He prepared for treatment. After performing safety check, patient was placed in the chamber which was compressed at a rate of 2 psi/min after confirming normal ear equalization. He tolerated the treatment and decompression of the chamber at 2 psi/min. Post- treatment vital signs were within normal range. He was stable upon discharge. Breanna Mcdaniel Notes No concerns with treatment given Physician HBO Attestation: I certify that I supervised this HBO treatment in accordance with Medicare guidelines. A trained emergency response team is readily available per Yes hospital policies and procedures. Continue HBOT as ordered. Yes Electronic Signature(s) Signed: 05/11/2023 5:32:55 PM By: [name] MD Previous Signature: 05/11/2023 11:45:18 AM Version By: [name] CHT EMT BS , , Previous Signature: 05/11/2023 9:18:25 AM Version By: [name] CHT EMT BS , , Entered By: [name] on 05/11/2023 14:28:42 [name] ([id number]) [id number] Page 2 of 2 -------------------------------------------------------------------------------- HBO Safety Checklist Details Patient Name: Date of Service: [place], [name] 05/11/2023 7:30 A M Medical Record Number: [id number] Patient Account Number: [id number] Date of Birth/Sex: Treating RN: 08/06/1965 (57 y.o. [name] Primary Care Maeby Vankleeck: [name] Other Clinician: [name] Referring Debera Sterba: Treating Merikay Lesniewski/Extender: Theodis Sato in Treatment: 19 HBO Safety Checklist Items Safety Checklist Consent Form Signed Patient voided / foley secured and emptied When did you last eato 0700 - SEC sandwich, Coffee, Protein Shake Last dose of injectable or oral agent 0630- 17 units Lantus Ostomy pouch emptied and vented if applicable NA All implantable devices assessed, documented and approved Freestyle Libre 2 and3 on approved list Intravenous  access site secured and place NA Valuables secured Linens and cotton and cotton/polyester blend (less than 51% polyester) Personal oil-based products / skin lotions / body lotions removed Wigs or hairpieces removed NA Smoking or tobacco materials removed NA Books / newspapers / magazines / loose  paper removed Cologne, aftershave, perfume and deodorant removed Jewelry removed (may wrap wedding band) Make-up removed NA Hair care products removed Battery operated devices (external) removed Heating patches and chemical warmers removed Titanium eyewear removed Nail polish cured greater than 10 hours NA Casting material cured greater than 10 hours NA Hearing aids removed NA Loose dentures or partials removed NA Prosthetics have been removed NA Patient demonstrates correct use of air break device (if applicable) Patient concerns have been addressed Patient grounding bracelet on and cord attached to chamber Specifics for Inpatients (complete in addition to above) Medication sheet sent with patient NA Intravenous medications needed or due during therapy sent with patient NA Drainage tubes (e.g. nasogastric tube or chest tube secured and vented) NA Endotracheal or Tracheotomy tube secured NA Cuff deflated of air and inflated with saline NA Airway suctioned NA Notes Paper version used prior to treatment start. Electronic Signature(s) Signed: 05/11/2023 9:15:34 AM By: [name] CHT EMT BS , , Entered By: [name] on 05/11/2023 06:15:33

## 2023-05-11 NOTE — Progress Notes (Addendum)
ABUBAKAR, CRISPO ([id number]) [id number] Page 1 of 2 Visit Report for 05/11/2023 Arrival Information Details Patient Name: Date of Service: [place], [name] 05/11/2023 7:30 A M Medical Record Number: [id number] Patient Account Number: [id number] Date of Birth/Sex: Treating RN: 12-May-1966 (57 y.o. Bayard Hugger, Bonita Quin Primary Care Tykira Wachs: [name] Other Clinician: [name] Referring Aubreyanna Dorrough: Treating Shatasia Cutshaw/Extender: Theodis Sato in Treatment: 19 Visit Information History Since Last Visit All ordered tests and consults were completed: Yes Patient Arrived: Ambulatory Added or deleted any medications: No Arrival Time: 07:45 Any new allergies or adverse reactions: No Accompanied By: self Had a fall or experienced change in No Transfer Assistance: None activities of daily living that may affect Patient Identification Verified: Yes risk of falls: Secondary Verification Process Completed: Yes Signs or symptoms of abuse/neglect since last visito No Patient Requires Transmission-Based Precautions: No Hospitalized since last visit: No Patient Has Alerts: No Implantable device outside of the clinic excluding No cellular tissue based products placed in the center since last visit: Pain Present Now: No Electronic Signature(s) Signed: 05/11/2023 9:08:42 AM By: [name] CHT EMT BS , , Entered By: [name] on 05/11/2023 78:29:56 -------------------------------------------------------------------------------- Encounter Discharge Information Details Patient Name: Date of Service: [name], [name] 05/11/2023 7:30 A M Medical Record Number: [id number] Patient Account Number: [id number] Date of Birth/Sex: Treating RN: 07-24-1966 (57 y.o. [name] Primary Care Jnyah Brazee: [name] Other Clinician: [name] Referring Clessie Karras: Treating Aletheia Tangredi/Extender: Theodis Sato in Treatment: 19 Encounter Discharge Information Items Discharge Condition: Stable Ambulatory Status: Ambulatory Discharge Destination: Other (Note Required) Transportation: Private Auto Accompanied By: self Schedule Follow-up Appointment: No Clinical Summary of Care: Notes Patient going to work today after treatment. Electronic Signature(s) Signed: 05/11/2023 11:46:24 AM By: [name] CHT EMT BS , , Entered By: [name] on 05/11/2023 08:46:23 [name] ([id number]) [id number] Page 2 of 2 -------------------------------------------------------------------------------- Vitals Details Patient Name: Date of Service: [place], [name] 05/11/2023 7:30 A M Medical Record Number: [id number] Patient Account Number: [id number] Date of Birth/Sex: Treating RN: 02/24/1966 (57 y.o. [name] Primary Care Gunner Iodice: [name] Other Clinician: Karl Bales Referring Megha Agnes: Treating Maurizio Geno/Extender: Theodis Sato in Treatment: 19 Vital Signs Time Taken: 07:49 Temperature (F): 97.8 Height (in): 68 Pulse (bpm): 75 Weight (lbs): 200 Respiratory Rate (breaths/min): 18 Body Mass Index (BMI): 30.4 Blood Pressure (mmHg): 150/63 Capillary Blood Glucose (mg/dl): 188 Reference Range: 80 - 120 mg / dl Electronic Signature(s) Signed: 05/11/2023 9:09:22 AM By: [name] CHT EMT BS , , Entered By: [name] on 05/11/2023 41:66:06

## 2023-05-12 ENCOUNTER — Encounter (HOSPITAL_BASED_OUTPATIENT_CLINIC_OR_DEPARTMENT_OTHER): Payer: 59 | Admitting: Internal Medicine

## 2023-05-12 ENCOUNTER — Ambulatory Visit (INDEPENDENT_AMBULATORY_CARE_PROVIDER_SITE_OTHER): Payer: 59 | Admitting: Vascular Surgery

## 2023-05-12 ENCOUNTER — Encounter: Payer: Self-pay | Admitting: Vascular Surgery

## 2023-05-12 VITALS — BP 126/71 | HR 75 | Temp 97.5°F | Resp 18 | Ht 68.0 in | Wt 210.6 lb

## 2023-05-12 DIAGNOSIS — E10621 Type 1 diabetes mellitus with foot ulcer: Secondary | ICD-10-CM | POA: Diagnosis not present

## 2023-05-12 DIAGNOSIS — I739 Peripheral vascular disease, unspecified: Secondary | ICD-10-CM | POA: Diagnosis not present

## 2023-05-12 LAB — GLUCOSE, CAPILLARY
Glucose-Capillary: 119 mg/dL — ABNORMAL HIGH (ref 70–99)
Glucose-Capillary: 131 mg/dL — ABNORMAL HIGH (ref 70–99)
Glucose-Capillary: 223 mg/dL — ABNORMAL HIGH (ref 70–99)

## 2023-05-12 NOTE — Progress Notes (Signed)
Patient name: [name] MRN: [id number] DOB: 1966/06/22 Sex: male  REASON FOR CONSULT: wound check, 6 week interval   HPI: [name] is a 57 y.o. male, with hx CAD, CKD, COPD, DM, HLD, tobacco abuse that presents for 6 week wound check of left achilles wound.  This wound started about early May.  He did have ABIs on 12/31/2022 that were 1.16 on the right triphasic and noncompressible on the left triphasic.  He did have a marginal toe pressure of 58 on the left.  We previously performed a left lower extremity angiogram on 01/15/2023 showing two-vessel runoff in the anterior tibial and peroneal with inline flow.    Has had Kerecis grafting and debridement with Dr. [name].  Is concerned about the wound overall but feels it is slightly smaller.  Past Medical History:  Diagnosis Date   Anginal pain (HCC)    Anxiety    Arthritis    RA IN HANDS   Asthma    CAD (coronary artery disease) 04/27/2014   Chronic renal disease, stage 3, moderately decreased glomerular filtration rate between 30-59 mL/min/1.73 square meter (HCC) 05/03/2014   COPD (chronic obstructive pulmonary disease) (HCC)    Coronary artery disease    Diabetes (HCC) 05/03/2014   Diabetes mellitus without complication (HCC)    type 1   Hyperlipidemia    Left shoulder pain    Peripheral vascular disease (HCC)    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/03/2014   Shortness of breath    Sleep apnea    mild OSA, no CPAP   Unstable angina pectoris (HCC) 04/25/2014    Past Surgical History:  Procedure Laterality Date   ABDOMINAL AORTOGRAM W/LOWER EXTREMITY N/A 01/15/2023   Procedure: ABDOMINAL AORTOGRAM W/LOWER EXTREMITY;  Surgeon: Cephus Shelling, MD;  Location: [facility] INVASIVE CV LAB;  Service: Cardiovascular;  Laterality: N/A;   CARDIAC CATHETERIZATION  04/25/2014   BY DR Jacinto Halim   CARDIAC CATHETERIZATION N/A 10/30/2015   Procedure: Left Heart Cath and Cors/Grafts Angiography;  Surgeon: Yates Decamp, MD;   Location: [facility] INVASIVE CV LAB;  Service: Cardiovascular;  Laterality: N/A;   CARPAL TUNNEL RELEASE     CATARACT EXTRACTION, BILATERAL     CORONARY ARTERY BYPASS GRAFT N/A 04/27/2014   Procedure: CORONARY ARTERY BYPASS GRAFTING on pump using left internal mammary artery to LAD coronary artery, right great saphenous vein graft to diagonal coronary artery with sequential to OM1 and circumflex coronary arteries. Right greater saphenous vein graft to posterior descending coronary artery. ;  Surgeon: Delight Ovens, MD;  Location: [facility] OR;  Service: Open Heart Surgery;  Laterality: N   elbow drained Left 05/09/2019   ENDOVEIN HARVEST OF GREATER SAPHENOUS VEIN Right 04/27/2014   Procedure: ENDOVEIN HARVEST OF GREATER SAPHENOUS VEIN;  Surgeon: Delight Ovens, MD;  Location: [facility] OR;  Service: Open Heart Surgery;  Laterality: Right;   EXCISION ORAL TUMOR N/A 03/01/2018   Procedure: EXCISION ORAL TUMOR;  Surgeon: Christia Reading, MD;  Location: [facility];  Service: ENT;  Laterality: N/A;   EYE SURGERY     LASER   EYE SURGERY Bilateral    astigmatism correction    HARDWARE REMOVAL Left 10/03/2020   Procedure: LEFT FOOT REMOVAL HARDWARE, PLACE VANC POWDER;  Surgeon: Nadara Mustard, MD;  Location: [facility] OR;  Service: Orthopedics;  Laterality: Left;   I & D EXTREMITY Left 03/04/2023   Procedure: LEFT ACHILLES DEBRIDEMENT;  Surgeon: Nadara Mustard, MD;  Location: [facility]  OR;  Service: Orthopedics;  Laterality: Left;   INTRAOPERATIVE TRANSESOPHAGEAL ECHOCARDIOGRAM N/A 04/27/2014   Procedure: INTRAOPERATIVE TRANSESOPHAGEAL ECHOCARDIOGRAM;  Surgeon: Delight Ovens, MD;  Location: [facility] OR;  Service: Open Heart Surgery;  Laterality: N/A;   IR RADIOLOGIST EVAL & MGMT  12/22/2022   LEFT HEART CATHETERIZATION WITH CORONARY ANGIOGRAM N/A 04/25/2014   Procedure: LEFT HEART CATHETERIZATION WITH CORONARY ANGIOGRAM;  Surgeon: Pamella Pert, MD;  Location: [facility] CATH LAB;  Service: Cardiovascular;  Laterality: N/A;    MOUTH SURGERY  11/15/2017   tongue surgery    ORIF TOE FRACTURE Left 03/22/2020   Procedure: OPEN REDUCTION INTERNAL FIXATION (ORIF) Non Union 5th Metatarsal;  Surgeon: Kathryne Hitch, MD;  Location: [facility];  Service: Orthopedics;  Laterality: Left;    Family History  Problem Relation Age of Onset   Stomach cancer Mother    Cancer Mother    Prostate cancer Father    Heart disease Father    Cancer - Prostate Father    Heart disease Brother    Asthma Daughter    Healthy Daughter    Asthma Daughter     SOCIAL HISTORY: Social History   Socioeconomic History   Marital status: Divorced    Spouse name: Not on file   Number of children: 3   Years of education: Not on file   Highest education level: Not on file  Occupational History   Not on file  Tobacco Use   Smoking status: Former    Current packs/day: 0.25    Average packs/day: 0.3 packs/day for 34.0 years (8.5 ttl pk-yrs)    Types: Cigarettes   Smokeless tobacco: Never  Vaping Use   Vaping status: Never Used  Substance and Sexual Activity   Alcohol use: Not Currently    Comment: RARE   Drug use: No   Sexual activity: Not on file  Other Topics Concern   Not on file  Social History Narrative   Not on file   Social Determinants of Health   Financial Resource Strain: Not on file  Food Insecurity: Not on file  Transportation Needs: Not on file  Physical Activity: Not on file  Stress: Not on file  Social Connections: Not on file  Intimate Partner Violence: Not on file    Allergies  Allergen Reactions   Tramadol Shortness Of Breath   Metoprolol Diarrhea   Niacin And Related     night sweats    Current Outpatient Medications  Medication Sig Dispense Refill   albuterol (VENTOLIN HFA) 108 (90 Base) MCG/ACT inhaler INHALE 2 PUFFS BY MOUTH EVERY 6 HOURS AS NEEDED FOR WHEEZING 9 g 0   Ascorbic Acid (VITAMIN C PO) Take 2,000 mg by mouth daily.     aspirin EC 81 MG tablet Take 1 tablet  (81 mg total) by mouth daily. Swallow whole. 150 tablet 2   bisoprolol (ZEBETA) 5 MG tablet TAKE ONE-HALF TABLET BY MOUTH  DAILY 45 tablet 3   Blood Glucose Monitoring Suppl (ONETOUCH VERIO FLEX SYSTEM) w/Device KIT USE TO CHECK BLOOD GLUCOSE UP TO 10 TIMES PER DAY     buPROPion (WELLBUTRIN SR) 150 MG 12 hr tablet TAKE 1 TABLET BY MOUTH DAILY 90 tablet 3   calcium carbonate (OSCAL) 1500 (600 Ca) MG TABS tablet Take 600 mg of elemental calcium by mouth daily.     cholecalciferol (VITAMIN D3) 25 MCG (1000 UNIT) tablet Take 1,000 Units by mouth daily.     COD LIVER OIL PO Take 1  capsule by mouth daily.     Continuous Blood Gluc Sensor (FREESTYLE LIBRE 14 DAY SENSOR) MISC CHANGE EVERY 14 DAYS TO MONITOR BLOOD GLUCOSE     gabapentin (NEURONTIN) 300 MG capsule Take 1 capsule (300 mg total) by mouth at bedtime. 30 capsule 2   HUMALOG 100 UNIT/ML injection Inject 15-20 Units into the skin 3 (three) times daily with meals. Sliding scale  Depending on carb intake     HYDROcodone-acetaminophen (NORCO/VICODIN) 5-325 MG tablet Take 1 tablet by mouth every 4 (four) hours as needed. 30 tablet 0   insulin glargine (LANTUS) 100 UNIT/ML injection Inject 17 Units into the skin 2 (two) times daily.     levofloxacin (LEVAQUIN) 750 MG tablet Take 1 tablet (750 mg total) by mouth daily. 10 tablet 0   losartan (COZAAR) 25 MG tablet Take 25 mg by mouth daily.     Magnesium 500 MG CAPS Take 500 mg by mouth 2 (two) times daily.     nitroGLYCERIN (NITROSTAT) 0.4 MG SL tablet DISSOLVE 1 TABLET UNDER THE TONGUE EVERY 5 MINUTES AS  NEEDED FOR CHEST PAIN. MAX  OF 3 TABLETS IN 15 MINUTES. CALL 911 IF PAIN PERSISTS. (Patient taking differently: Place 0.4 mg under the tongue every 5 (five) minutes as needed for chest pain.) 100 tablet 3   Omega-3 Fatty Acids (FISH OIL PO) Take 5,000 mg by mouth daily.     OVER THE COUNTER MEDICATION Take 1 capsule by mouth 2 (two) times daily. Nitric oxide blood flow supplement     pentoxifylline  (TRENTAL) 400 MG CR tablet TAKE 1 TABLET BY MOUTH THREE TIMES DAILY WITH MEALS 90 tablet 0   Probiotic Product (ALIGN) 4 MG CAPS Take 4 mg by mouth daily.     ranolazine (RANEXA) 500 MG 12 hr tablet Take 250 mg by mouth at bedtime.     rivaroxaban (XARELTO) 2.5 MG TABS tablet Take 1 tablet (2.5 mg total) by mouth 2 (two) times daily. (Patient not taking: Reported on 03/31/2023) 180 tablet 1   SANTYL 250 UNIT/GM ointment Apply 1 Application topically 2 (two) times daily.     simvastatin (ZOCOR) 20 MG tablet TAKE 1 TABLET BY MOUTH AT BEDTIME 90 tablet 0   sulfamethoxazole-trimethoprim (BACTRIM DS) 800-160 MG tablet Take 1 tablet by mouth 2 (two) times daily.     TURMERIC CURCUMIN PO Take 1,500 mg by mouth 2 (two) times daily.     varenicline (CHANTIX) 0.5 MG tablet Take 0.5 mg by mouth daily.     vitamin B-12 (CYANOCOBALAMIN) 500 MCG tablet Take 500 mcg by mouth daily.     vitamin E 180 MG (400 UNITS) capsule Take 400 Units by mouth daily.     No current facility-administered medications for this visit.    REVIEW OF SYSTEMS:  [X]  denotes positive finding, [ ]  denotes negative finding Cardiac  Comments:  Chest pain or chest pressure:    Shortness of breath upon exertion:    Short of breath when lying flat:    Irregular heart rhythm:        Vascular    Pain in calf, thigh, or hip brought on by ambulation:    Pain in feet at night that wakes you up from your sleep:     Blood clot in your veins:    Leg swelling:         Pulmonary    Oxygen at home:    Productive cough:     Wheezing:  Neurologic    Sudden weakness in arms or legs:     Sudden numbness in arms or legs:     Sudden onset of difficulty speaking or slurred speech:    Temporary loss of vision in one eye:     Problems with dizziness:         Gastrointestinal    Blood in stool:     Vomited blood:         Genitourinary    Burning when urinating:     Blood in urine:        Psychiatric    Major depression:          Hematologic    Bleeding problems:    Problems with blood clotting too easily:        Skin    Rashes or ulcers:        Constitutional    Fever or chills:      PHYSICAL EXAM: There were no vitals filed for this visit.   GENERAL: The patient is a well-nourished male, in no acute distress. The vital signs are documented above. CARDIAC: There is a regular rate and rhythm.  VASCULAR:  Palpable femoral pulses bilaterally No palpable pedal pulses, triphasic dorsalis pedis signal in left foot Left achilles wound pictured     DATA:   ABIs on 12/31/2022 that were 1.16 on the right triphasic and noncompressible on the left triphasic.    Assessment/Plan:  57 y.o. male, with hx CAD, CKD, COPD, DM, HLD, tobacco abuse that presents for 6 week follow-up and wound check of left achilles wound. This wound started about early May.  We did an angiogram on 01/15/2023 showing two-vessel runoff in the anterior tibial and peroneal and no intervention was performed and he had in-line flow.  He has since had debridement and grafting with Dr. [name].  Overall I think the wound is smaller.  I reiterated that these are very difficult to get to heal.  I will bring him back in another 4 to 6 weeks for ongoing wound check.  He has a brisk multiphasic DP signal in the left foot today.  I will get a left leg arterial duplex to ensure we are not missing any flow-limiting stenosis.  His PT is chronically occluded.   Cephus Shelling, MD [facility] Office: [phone]

## 2023-05-12 NOTE — Progress Notes (Signed)
[name], [name] ([id number]) [id number] Page 1 of 2 Visit Report for 05/12/2023 HBO Details Patient Name: Date of Service: [name], [name] 05/12/2023 7:30 A M Medical Record Number: [id number] Patient Account Number: [id number] Date of Birth/Sex: Treating RN: 23-Sep-1965 (57 y.o. [name] Primary Care Kerah Hardebeck: [name] Other Clinician: [name] Referring Cheron Pasquarelli: Treating Mikael Skoda/Extender: Theodis Sato in Treatment: 19 HBO Treatment Course Details Treatment Course Number: 1 Ordering Golden Gilreath: Geralyn Corwin T Treatments Ordered: otal 60 HBO Treatment Start Date: 02/16/2023 HBO Indication: Other (specify in Notes) Notes: Acute peripheral arterial insufficiency HBO Treatment Details Treatment Number: 50 Patient Type: Outpatient Chamber Type: Monoplace Chamber Serial #: [id number] Treatment Protocol: 2.0 ATA with 90 minutes oxygen, with two 5 minute air breaks Treatment Details Compression Rate Down: 1.5 psi / minute De-Compression Rate Up: 2.0 psi / minute A breaks and breathing ir Compress Tx Pressure periods Decompress Decompress Begins Reached (leave unused spaces Begins Ends blank) Chamber Pressure (ATA 1 2 2 2 2 2  --2 1 ) Clock Time (24 hr) 08:11 08:23 08:53 08:58 09:28 09:33 - - 10:03 10:14 Treatment Length: 123 (minutes) Treatment Segments: 4 Vital Signs Capillary Blood Glucose Reference Range: 80 - 120 mg / dl HBO Diabetic Blood Glucose Intervention Range: <131 mg/dl or >244 mg/dl Type: Time Vitals Blood Pulse: Respiratory Temperature: Capillary Blood Glucose Pulse Action Taken: Pressure: Rate: Glucose (mg/dl): Meter #: Oximetry (%) Taken: Pre 07:46 132/66 75 18 97.4 119 Given 8 oz glucerna. Post 10:17 118/61 75 18 98 223 none per protocol Pre 08:06 131 Cleared for HBOT per protocol Treatment Response Treatment Toleration: Well Treatment Completion Status: Treatment Completed  without Adverse Event Treatment Notes Mr. Gorzynski arrived with normal vital signs. He stated that he ate breakfast which was a sausage, egg, and Cheese biscuit, protein shake, orange Juice. His blood glucose level was 119 mg/dL. Per protocol, he was given an 8 oz Glucerna. He prepared for treatment. Blood glucose level rechecked at 0806 with result of 131 mg/dL. Patient has history with [place] of managing his blood glucose well. After performing safety check, patient was placed in the chamber which was compressed at a rate of 2 psi/min after confirming normal ear equalization. He tolerated the treatment and decompression of the chamber at 2 psi/min. He denied any issues with ear equalization and/or pain associated with barotrauma. Post-treatment vital signs were within normal range. He was stable upon discharge. Ayen Viviano Notes No concerns with treatment given Physician HBO Attestation: I certify that I supervised this HBO treatment in accordance with Medicare guidelines. A trained emergency response team is readily available per Yes hospital policies and procedures. Continue HBOT as ordered. Yes Electronic Signature(s) Signed: 05/13/2023 9:33:55 AM By: [name] MD Previous Signature: 05/12/2023 12:02:15 PM Version By: [name] CHT EMT BS , , JONATHYN, BACUS ([id number]) [id number] Page 2 of 2 Previous Signature: 05/12/2023 12:02:15 PM Version By: [name] CHT EMT BS , , Previous Signature: 05/12/2023 11:48:31 AM Version By: [name] CHT EMT BS , , Entered By: [name] on 05/12/2023 14:38:29 -------------------------------------------------------------------------------- HBO Safety Checklist Details Patient Name: Date of Service: [name], [name] [name] 05/12/2023 7:30 A M Medical Record Number: [id number] Patient Account Number: [id number] Date of Birth/Sex: Treating RN: 22-Apr-1966 (57 y.o. [name] Primary Care Esta Carmon:  [name] Other Clinician: [name] Referring Ellanora Rayborn: Treating Anicia Leuthold/Extender: Theodis Sato in Treatment: 19 HBO Safety Checklist Items Safety Checklist Consent Form Signed Patient voided / foley secured  and emptied When did you last eato 0700 - SEC Biscuit, OJ , Protein Shake Last dose of injectable or oral agent 0630 17 units Ostomy pouch emptied and vented if applicable NA All implantable devices assessed, documented and approved Intravenous access site secured and place NA Valuables secured Linens and cotton and cotton/polyester blend (less than 51% polyester) Personal oil-based products / skin lotions / body lotions removed Wigs or hairpieces removed NA Smoking or tobacco materials removed NA Books / newspapers / magazines / loose paper removed Cologne, aftershave, perfume and deodorant removed Jewelry removed (may wrap wedding band) Make-up removed NA Hair care products removed Battery operated devices (external) removed Heating patches and chemical warmers removed Titanium eyewear removed Nail polish cured greater than 10 hours Casting material cured greater than 10 hours Hearing aids removed NA Loose dentures or partials removed NA Prosthetics have been removed NA Patient demonstrates correct use of air break device (if applicable) Patient concerns have been addressed Patient grounding bracelet on and cord attached to chamber Specifics for Inpatients (complete in addition to above) Medication sheet sent with patient NA Intravenous medications needed or due during therapy sent with patient NA Drainage tubes (e.g. nasogastric tube or chest tube secured and vented) NA Endotracheal or Tracheotomy tube secured NA Cuff deflated of air and inflated with saline NA Airway suctioned NA Notes Paper version used prior to treatment start. Electronic Signature(s) Signed: 05/12/2023 9:01:08 AM By: [name] CHT  EMT BS , , Entered By: [name] on 05/12/2023 06:01:08

## 2023-05-12 NOTE — Progress Notes (Addendum)
[name] ([id number]) [id number] Page 1 of 2 Visit Report for 05/12/2023 Arrival Information Details Patient Name: Date of Service: [name] 05/12/2023 7:30 A M Medical Record Number: [id number] Patient Account Number: [id number] Date of Birth/Sex: Treating RN: [birth year] (57 y.o. [name] Primary Care Makynlee Kressin: [name] Other Clinician: [name] Referring Cartier Washko: Treating Phill Steck/Extender: Theodis Sato in Treatment: 19 Visit Information History Since Last Visit All ordered tests and consults were completed: Yes Patient Arrived: Ambulatory Added or deleted any medications: No Arrival Time: 07:38 Any new allergies or adverse reactions: No Accompanied By: self Had a fall or experienced change in No Transfer Assistance: None activities of daily living that may affect Patient Identification Verified: Yes risk of falls: Secondary Verification Process Completed: Yes Signs or symptoms of abuse/neglect since last visito No Patient Requires Transmission-Based Precautions: No Hospitalized since last visit: No Patient Has Alerts: No Implantable device outside of the clinic excluding No cellular tissue based products placed in the center since last visit: Pain Present Now: No Electronic Signature(s) Signed: 05/12/2023 8:59:23 AM By: [name] CHT EMT BS , , Entered By: [name] on 05/12/2023 08:59:23 -------------------------------------------------------------------------------- Encounter Discharge Information Details Patient Name: Date of Service: [name], [name] [name] 05/12/2023 7:30 A M Medical Record Number: [id number] Patient Account Number: [id number] Date of Birth/Sex: Treating RN: 1965/10/31 (57 y.o. [name] Primary Care Berenis Corter: [name] Other Clinician: [name] Referring Yaretsi Humphres: Treating Lamarcus Spira/Extender: Theodis Sato in Treatment: 19 Encounter Discharge Information Items Discharge Condition: Stable Ambulatory Status: Ambulatory Discharge Destination: Other (Note Required) Transportation: Private Auto Accompanied By: self Schedule Follow-up Appointment: No Clinical Summary of Care: Notes Patient going to work today after treatment. Electronic Signature(s) Signed: 05/12/2023 11:50:13 AM By: [name] CHT EMT BS , , Entered By: [name] on 05/12/2023 11:50:13 [name] ([id number]) [id number] Page 2 of 2 -------------------------------------------------------------------------------- Vitals Details Patient Name: Date of Service: [name] 05/12/2023 7:30 A M Medical Record Number: [id number] Patient Account Number: [id number] Date of Birth/Sex: Treating RN: Dec 28, 1965 (57 y.o. [name] Primary Care Dietrich Samuelson: [name] Other Clinician: [name] Referring Daylene Vandenbosch: Treating Ireanna Finlayson/Extender: Theodis Sato in Treatment: 19 Vital Signs Time Taken: 07:46 Temperature (F): 97.4 Height (in): 68 Pulse (bpm): 75 Weight (lbs): 200 Respiratory Rate (breaths/min): 18 Body Mass Index (BMI): 30.4 Blood Pressure (mmHg): 132/66 Capillary Blood Glucose (mg/dl): 433 Reference Range: 80 - 120 mg / dl Electronic Signature(s) Signed: 05/12/2023 8:59:49 AM By: [name] CHT EMT BS , , Entered By: [name] on 05/12/2023 08:59:49

## 2023-05-12 NOTE — Progress Notes (Signed)
TRINIDAD, PETRON ([id number]) [id number] Page 1 of 1 Visit Report for 05/11/2023 SuperBill Details Patient Name: Date of Service: [name], [name] 05/11/2023 Medical Record Number: [id number] Patient Account Number: [id number] Date of Birth/Sex: Treating RN: 03-30-1966 (57 y.o. [name] Primary Care Provider: [name] Other Clinician: [name] Referring Provider: Treating Provider/Extender: Theodis Sato in Treatment: 19 Diagnosis Coding ICD-10 Codes Code Description [phone] Non-pressure chronic ulcer of other part of left foot with other specified severity I70.245 Atherosclerosis of native arteries of left leg with ulceration of other part of foot 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 Facility Procedures CPT4 Code Description Modifier Quantity [id number] G0277-(Facility Use Only) HBOT full body chamber, , 4 ICD-10 Diagnosis Description 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 Physician Procedures Quantity CPT4 Code Description Modifier [id number] 99183 - WC PHYS HYPERBARIC OXYGEN THERAPY 1 ICD-10 Diagnosis Description 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 Electronic Signature(s) Signed: 05/11/2023 11:45:40 AM By: [name] CHT EMT BS , , Signed: 05/11/2023 5:32:55 PM By: [name] MD Entered By: [name] on 05/11/2023 08:45:38

## 2023-05-13 ENCOUNTER — Ambulatory Visit (INDEPENDENT_AMBULATORY_CARE_PROVIDER_SITE_OTHER): Payer: 59 | Admitting: Family

## 2023-05-13 ENCOUNTER — Encounter: Payer: Self-pay | Admitting: Family

## 2023-05-13 DIAGNOSIS — L97423 Non-pressure chronic ulcer of left heel and midfoot with necrosis of muscle: Secondary | ICD-10-CM

## 2023-05-13 NOTE — Progress Notes (Signed)
COLTON, TASSIN ([id number]) [id number] Page 1 of 1 Visit Report for 05/12/2023 SuperBill Details Patient Name: Date of Service: [place], [name] 05/12/2023 Medical Record Number: [id number] Patient Account Number: [id number] Date of Birth/Sex: Treating RN: Sep 03, 1965 (57 y.o. [name] Primary Care Provider: [name] Other Clinician: [name] Referring Provider: Treating Provider/Extender: Theodis Sato in Treatment: 19 Diagnosis Coding ICD-10 Codes Code Description [phone] Non-pressure chronic ulcer of other part of left foot with other specified severity I70.245 Atherosclerosis of native arteries of left leg with ulceration of other part of foot 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 Facility Procedures CPT4 Code Description Modifier Quantity [id number] G0277-(Facility Use Only) HBOT full body chamber, , 4 ICD-10 Diagnosis Description 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 Physician Procedures Quantity CPT4 Code Description Modifier [id number] 99183 - WC PHYS HYPERBARIC OXYGEN THERAPY 1 ICD-10 Diagnosis Description 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 Electronic Signature(s) Signed: 05/12/2023 11:48:56 AM By: [name] CHT EMT BS , , Signed: 05/13/2023 9:33:55 AM By: [name] MD Entered By: [name] on 05/12/2023 08:48:56

## 2023-05-13 NOTE — Progress Notes (Signed)
Post-Op Visit Note   Patient: [name]           Date of Birth: [birth year]           MRN: [id number] Visit Date: 05/13/2023 PCP: Garlan Fillers, MD  Chief Complaint:  Chief Complaint  Patient presents with   Left Leg - Routine Post Op    03/04/23 left achilles debridement    HPI:  HPI The patient is a 57 year old gentleman who is seen in follow-up status post left Achilles debridement on July 31 he has had Kerecis micro powder applied in the office, donated, many times.  Last application was September 18.  He is concerned about some stalling perhaps even worsening of the wound.  Is concerned it may be deeper.  He also continues with home exercise program as well as hyperbaric oxygen treatments   Ortho Exam On examination of the left Achilles area of the ulcer is 100% filled in with granulation there is good uptake of the Kerecis powder.  There is no undermining.  The measurements today are 1.2 centimeters by 1.2 cm in width there is 2 mm of depth some mild surrounding erythema however appears to be healing circumferentially No erythema warmth or sign of infection  Donation Kerecis micro powder applied again today.  Visit Diagnoses: No diagnosis found.  Plan: Kerecis micro powder donation reapplied today.  Will change overlying gauze daily as instructed daily for the first week he will follow-up in the office in 1 more week for reevaluation  Follow-Up Instructions: No follow-ups on file.   Imaging: No results found.  Orders:  No orders of the defined types were placed in this encounter.  No orders of the defined types were placed in this encounter.    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 03/22/2020   Osteopenia of multiple  sites 02/28/2020   PVD (peripheral vascular disease) (HCC) 10/11/2019   Chronic venous insufficiency 10/05/2018   Diabetic cataract (HCC) 05/21/2018   Controlled type 1 diabetes mellitus with diabetic peripheral angiopathy without gangrene (HCC) 08/14/2017   Dyslipidemia 03/26/2017   High risk medication use 09/23/2016   History of hypertension 09/23/2016   History of chronic kidney disease 09/23/2016   History of coronary artery disease 09/23/2016   Tobacco abuse 09/23/2016   Primary osteoarthritis of both knees 09/23/2016   History of diabetes mellitus 09/23/2016   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 (HCC) 11/20/2015   Chest pain with high risk for cardiac etiology 10/29/2015   Asymptomatic bilateral carotid artery stenosis 06/08/2014   Pleural effusion, bilateral 06/03/2014   Dyspnea 06/01/2014   Diabetes (HCC) 05/03/2014   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/03/2014   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   Chronic renal disease, stage 3, moderately decreased glomerular filtration rate between 30-59 mL/min/1.73 square meter (HCC) 05/03/2014   CAD (coronary artery disease) 04/27/2014   Acne 12/19/2013   On isotretinoin therapy 12/19/2013   Nuclear sclerotic cataract, bilateral 12/19/2011   Vitreous hemorrhage (HCC) 06/16/2011   Past Medical History:  Diagnosis Date   Anginal pain (HCC)    Anxiety    Arthritis    RA IN HANDS   Asthma  CAD (coronary artery disease) 04/27/2014   Chronic renal disease, stage 3, moderately decreased glomerular filtration rate between 30-59 mL/min/1.73 square meter (HCC) 05/03/2014   COPD (chronic obstructive pulmonary disease) (HCC)    Coronary artery disease    Diabetes (HCC) 05/03/2014   Diabetes mellitus without complication (HCC)    type 1   Hyperlipidemia    Left shoulder pain    Peripheral vascular disease (HCC)    Rheumatoid  arthritis involving multiple joints (HCC) 05/03/2014   Shortness of breath    Sleep apnea    mild OSA, no CPAP   Unstable angina pectoris (HCC) 04/25/2014    Family History  Problem Relation Age of Onset   Stomach cancer Mother    Cancer Mother    Prostate cancer Father    Heart disease Father    Cancer - Prostate Father    Heart disease Brother    Asthma Daughter    Healthy Daughter    Asthma Daughter     Past Surgical History:  Procedure Laterality Date   ABDOMINAL AORTOGRAM W/LOWER EXTREMITY N/A 01/15/2023   Procedure: ABDOMINAL AORTOGRAM W/LOWER EXTREMITY;  Surgeon: Cephus Shelling, MD;  Location: [facility] INVASIVE CV LAB;  Service: Cardiovascular;  Laterality: N/A;   CARDIAC CATHETERIZATION  04/25/2014   BY DR Jacinto Halim   CARDIAC CATHETERIZATION N/A 10/30/2015   Procedure: Left Heart Cath and Cors/Grafts Angiography;  Surgeon: Yates Decamp, MD;  Location: [facility] INVASIVE CV LAB;  Service: Cardiovascular;  Laterality: N/A;   CARPAL TUNNEL RELEASE     CATARACT EXTRACTION, BILATERAL     CORONARY ARTERY BYPASS GRAFT N/A 04/27/2014   Procedure: CORONARY ARTERY BYPASS GRAFTING on pump using left internal mammary artery to LAD coronary artery, right great saphenous vein graft to diagonal coronary artery with sequential to OM1 and circumflex coronary arteries. Right greater saphenous vein graft to posterior descending coronary artery. ;  Surgeon: Delight Ovens, MD;  Location: [facility] OR;  Service: Open Heart Surgery;  Laterality: N   elbow drained Left 05/09/2019   ENDOVEIN HARVEST OF GREATER SAPHENOUS VEIN Right 04/27/2014   Procedure: ENDOVEIN HARVEST OF GREATER SAPHENOUS VEIN;  Surgeon: Delight Ovens, MD;  Location: [facility] OR;  Service: Open Heart Surgery;  Laterality: Right;   EXCISION ORAL TUMOR N/A 03/01/2018   Procedure: EXCISION ORAL TUMOR;  Surgeon: Christia Reading, MD;  Location: [facility];  Service: ENT;  Laterality: N/A;   EYE SURGERY     LASER   EYE SURGERY Bilateral     astigmatism correction    HARDWARE REMOVAL Left 10/03/2020   Procedure: LEFT FOOT REMOVAL HARDWARE, PLACE VANC POWDER;  Surgeon: Nadara Mustard, MD;  Location: [facility] OR;  Service: Orthopedics;  Laterality: Left;   I & D EXTREMITY Left 03/04/2023   Procedure: LEFT ACHILLES DEBRIDEMENT;  Surgeon: Nadara Mustard, MD;  Location: [facility] OR;  Service: Orthopedics;  Laterality: Left;   INTRAOPERATIVE TRANSESOPHAGEAL ECHOCARDIOGRAM N/A 04/27/2014   Procedure: INTRAOPERATIVE TRANSESOPHAGEAL ECHOCARDIOGRAM;  Surgeon: Delight Ovens, MD;  Location: [facility] OR;  Service: Open Heart Surgery;  Laterality: N/A;   IR RADIOLOGIST EVAL & MGMT  12/22/2022   LEFT HEART CATHETERIZATION WITH CORONARY ANGIOGRAM N/A 04/25/2014   Procedure: LEFT HEART CATHETERIZATION WITH CORONARY ANGIOGRAM;  Surgeon: Pamella Pert, MD;  Location: [facility] CATH LAB;  Service: Cardiovascular;  Laterality: N/A;   MOUTH SURGERY  11/15/2017   tongue surgery    ORIF TOE FRACTURE Left 03/22/2020   Procedure: OPEN REDUCTION INTERNAL FIXATION (ORIF) Non  Union 5th Metatarsal;  Surgeon: Kathryne Hitch, MD;  Location: [facility];  Service: Orthopedics;  Laterality: Left;   Social History   Occupational History   Not on file  Tobacco Use   Smoking status: Former    Current packs/day: 0.25    Average packs/day: 0.3 packs/day for 34.0 years (8.5 ttl pk-yrs)    Types: Cigarettes   Smokeless tobacco: Never  Vaping Use   Vaping status: Never Used  Substance and Sexual Activity   Alcohol use: Not Currently    Comment: RARE   Drug use: No   Sexual activity: Not on file

## 2023-05-14 ENCOUNTER — Other Ambulatory Visit: Payer: Self-pay | Admitting: Orthopedic Surgery

## 2023-05-14 ENCOUNTER — Telehealth: Payer: Self-pay | Admitting: Family

## 2023-05-14 ENCOUNTER — Encounter (HOSPITAL_BASED_OUTPATIENT_CLINIC_OR_DEPARTMENT_OTHER): Payer: 59 | Admitting: Internal Medicine

## 2023-05-14 MED ORDER — PENTOXIFYLLINE ER 400 MG PO TBCR: 400.0000 mg | EXTENDED_RELEASE_TABLET | Freq: Three times a day (TID) | ORAL | 0 refills | Status: DC

## 2023-05-14 MED ORDER — LEVOFLOXACIN 750 MG PO TABS: 750.0000 mg | ORAL_TABLET | Freq: Every day | ORAL | 0 refills | Status: DC

## 2023-05-14 NOTE — Telephone Encounter (Signed)
Patient called and said he needs a refill on both medication.  Levofloxacin, and Penpoxifyllineer [phone]

## 2023-05-15 ENCOUNTER — Other Ambulatory Visit: Payer: Self-pay | Admitting: Family

## 2023-05-15 ENCOUNTER — Encounter (HOSPITAL_BASED_OUTPATIENT_CLINIC_OR_DEPARTMENT_OTHER): Payer: 59 | Admitting: General Surgery

## 2023-05-15 DIAGNOSIS — E10621 Type 1 diabetes mellitus with foot ulcer: Secondary | ICD-10-CM | POA: Diagnosis not present

## 2023-05-15 LAB — GLUCOSE, CAPILLARY
Glucose-Capillary: 152 mg/dL — ABNORMAL HIGH (ref 70–99)
Glucose-Capillary: 204 mg/dL — ABNORMAL HIGH (ref 70–99)

## 2023-05-15 NOTE — Progress Notes (Addendum)
AYVAN, STOCKHAUSEN ([id number]) [id number] Page 1 of 2 Visit Report for 05/15/2023 HBO Details Patient Name: Date of Service: [place], [name] 05/15/2023 7:30 A M Medical Record Number: [id number] Patient Account Number: [id number] Date of Birth/Sex: Treating RN: 03-Dec-1965 (57 y.o. Yates Decamp Primary Care Kazim Corrales: [name] Other Clinician: [name] Referring Avaline Stillson: Treating Christyana Corwin/Extender: Marena Chancy in Treatment: 20 HBO Treatment Course Details Treatment Course Number: 1 Ordering Vannah Nadal: Geralyn Corwin T Treatments Ordered: otal 60 HBO Treatment Start Date: 02/16/2023 HBO Indication: Other (specify in Notes) Notes: Acute peripheral arterial insufficiency HBO Treatment Details Treatment Number: 51 Patient Type: Outpatient Chamber Type: Monoplace Chamber Serial #: [id number] Treatment Protocol: 2.0 ATA with 90 minutes oxygen, with two 5 minute air breaks Treatment Details Compression Rate Down: 1.5 psi / minute De-Compression Rate Up: 2.0 psi / minute A breaks and breathing ir Compress Tx Pressure periods Decompress Decompress Begins Reached (leave unused spaces Begins Ends blank) Chamber Pressure (ATA 1 2 2 2 2 2  --2 1 ) Clock Time (24 hr) 08:00 08:10 08:40 08:45 09:15 09:20 - - 09:50 09:57 Treatment Length: 117 (minutes) Treatment Segments: 4 Vital Signs Capillary Blood Glucose Reference Range: 80 - 120 mg / dl HBO Diabetic Blood Glucose Intervention Range: <131 mg/dl or >086 mg/dl Type: Time Vitals Blood Respiratory Capillary Blood Glucose Pulse Action Pulse: Temperature: Taken: Pressure: Rate: Glucose (mg/dl): Meter #: Oximetry (%) Taken: Pre 07:47 154/100 75 18 98.2 152 none per protocol Post 09:58 123/50 68 16 97.9 204 none per protocol Treatment Response Treatment Toleration: Well Treatment Completion Status: Treatment Completed without Adverse Event Treatment Notes Mr.  Kosko arrived with normal vital signs. He stated that he ate breakfast which was a sausage, egg, and Cheese biscuit, protein shake. He prepared for treatment. After performing safety check, patient was placed in the chamber which was compressed at a rate of 2 psi/min after confirming normal ear equalization. He tolerated the treatment and decompression of the chamber at 2 psi/min. He denied any issues with ear equalization and/or pain associated with barotrauma. Post-treatment vital signs were within normal range except diastolic BP of 50 mmHg. He denied any symptoms of hypotension. He was stable upon discharge. Physician HBO Attestation: I certify that I supervised this HBO treatment in accordance with Medicare guidelines. A trained emergency response team is readily available per Yes hospital policies and procedures. Continue HBOT as ordered. Yes Electronic Signature(s) Signed: 05/15/2023 12:05:09 PM By: Duanne Guess MD FACS Previous Signature: 05/15/2023 11:31:11 AM Version By: [name] CHT EMT BS , , Entered By: Duanne Guess on 05/15/2023 09:05:09 [name] ([id number]) [id number] Page 2 of 2 -------------------------------------------------------------------------------- HBO Safety Checklist Details Patient Name: Date of Service: [place], [name] 05/15/2023 7:30 A M Medical Record Number: [id number] Patient Account Number: [id number] Date of Birth/Sex: Treating RN: 1966/02/06 (57 y.o. Yates Decamp Primary Care Kele Withem: [name] Other Clinician: [name] Referring Jahlisa Rossitto: Treating Minta Fair/Extender: Marena Chancy in Treatment: 20 HBO Safety Checklist Items Safety Checklist Consent Form Signed Patient voided / foley secured and emptied 0700 - SEC Biscuit, Protein Shake, small amt When did you last eato of coffee Last dose of injectable or oral agent 0630 - 17 units Lantus Ostomy  pouch emptied and vented if applicable NA All implantable devices assessed, documented and approved Freestyle Libre 2 and3 on approved list Intravenous access site secured and place NA Valuables secured Linens and cotton and cotton/polyester blend (less than 51% polyester) Personal  oil-based products / skin lotions / body lotions removed Wigs or hairpieces removed NA Smoking or tobacco materials removed NA Books / newspapers / magazines / loose paper removed Cologne, aftershave, perfume and deodorant removed Jewelry removed (may wrap wedding band) Make-up removed NA Hair care products removed Battery operated devices (external) removed Heating patches and chemical warmers removed Titanium eyewear removed Nail polish cured greater than 10 hours NA Casting material cured greater than 10 hours NA Hearing aids removed NA Loose dentures or partials removed NA Prosthetics have been removed NA Patient demonstrates correct use of air break device (if applicable) Patient concerns have been addressed Patient grounding bracelet on and cord attached to chamber Specifics for Inpatients (complete in addition to above) Medication sheet sent with patient NA Intravenous medications needed or due during therapy sent with patient NA Drainage tubes (e.g. nasogastric tube or chest tube secured and vented) NA Endotracheal or Tracheotomy tube secured NA Cuff deflated of air and inflated with saline NA Airway suctioned NA Notes Paper version used prior to treatment start. Electronic Signature(s) Signed: 05/15/2023 11:27:50 AM By: [name] CHT EMT BS , , Entered By: [name] on 05/15/2023 08:27:50

## 2023-05-15 NOTE — Progress Notes (Signed)
SEPHIROTH, MCLUCKIE ([id number]) [id number] Page 1 of 1 Visit Report for 05/15/2023 SuperBill Details Patient Name: Date of Service: [place], [name] 05/15/2023 Medical Record Number: [id number] Patient Account Number: [id number] Date of Birth/Sex: Treating RN: 11-26-1965 (57 y.o. Yates Decamp Primary Care Provider: [name] Other Clinician: [name] Referring Provider: Treating Provider/Extender: Marena Chancy in Treatment: 20 Diagnosis Coding ICD-10 Codes Code Description [phone] Non-pressure chronic ulcer of other part of left foot with other specified severity I70.245 Atherosclerosis of native arteries of left leg with ulceration of other part of foot 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 Facility Procedures CPT4 Code Description Modifier Quantity [id number]-(Facility Use Only) HBOT full body chamber, , 4 ICD-10 Diagnosis Description 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 Physician Procedures Quantity CPT4 Code Description Modifier [id number] - WC PHYS HYPERBARIC OXYGEN THERAPY 1 ICD-10 Diagnosis Description 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 Electronic Signature(s) Signed: 05/15/2023 11:31:31 AM By: [name] CHT EMT BS , , Signed: 05/15/2023 12:03:38 PM By: Duanne Guess MD FACS Entered By: [name] on 05/15/2023 08:31:30

## 2023-05-15 NOTE — Progress Notes (Signed)
INDY, KUCK ([id number]) [id number] Page 1 of 2 Visit Report for 05/15/2023 Arrival Information Details Patient Name: Date of Service: [place], [name] 05/15/2023 7:30 A M Medical Record Number: [id number] Patient Account Number: [id number] Date of Birth/Sex: Treating RN: 28-Jun-1966 (57 y.o. Yates Decamp Primary Care Zeenat Jeanbaptiste: [name] Other Clinician: [name] Referring Courtenay Hirth: Treating Cola Gane/Extender: Marena Chancy in Treatment: 20 Visit Information History Since Last Visit All ordered tests and consults were completed: Yes Patient Arrived: Ambulatory Added or deleted any medications: No Arrival Time: 07:38 Any new allergies or adverse reactions: No Accompanied By: self Had a fall or experienced change in No Transfer Assistance: None activities of daily living that may affect Patient Identification Verified: Yes risk of falls: Secondary Verification Process Completed: Yes Signs or symptoms of abuse/neglect since last visito No Patient Requires Transmission-Based Precautions: No Hospitalized since last visit: No Patient Has Alerts: No Implantable device outside of the clinic excluding No cellular tissue based products placed in the center since last visit: Pain Present Now: No Electronic Signature(s) Signed: 05/15/2023 11:24:11 AM By: [name] CHT EMT BS , , Entered By: [name] on 05/15/2023 08:24:11 -------------------------------------------------------------------------------- Encounter Discharge Information Details Patient Name: Date of Service: [name], [name] 05/15/2023 7:30 A M Medical Record Number: [id number] Patient Account Number: [id number] Date of Birth/Sex: Treating RN: 1965/09/16 (57 y.o. Yates Decamp Primary Care Filipe Greathouse: [name] Other Clinician: [name] Referring Keelin Neville: Treating Taniela Feltus/Extender: Marena Chancy in Treatment: 20 Encounter Discharge Information Items Discharge Condition: Stable Ambulatory Status: Ambulatory Discharge Destination: Other (Note Required) Transportation: Private Auto Accompanied By: self Schedule Follow-up Appointment: No Clinical Summary of Care: Notes Patient going to work today after treatment. Electronic Signature(s) Signed: 05/15/2023 11:32:11 AM By: [name] CHT EMT BS , , Entered By: [name] on 05/15/2023 08:32:10 [name] ([id number]) [id number] Page 2 of 2 -------------------------------------------------------------------------------- Vitals Details Patient Name: Date of Service: [place], [name] 05/15/2023 7:30 A M Medical Record Number: [id number] Patient Account Number: [id number] Date of Birth/Sex: Treating RN: [birth year] (57 y.o. Yates Decamp Primary Care Jarius Dieudonne: [name] Other Clinician: [name] Referring Lilah Mijangos: Treating Mairely Foxworth/Extender: Marena Chancy in Treatment: 20 Vital Signs Time Taken: 07:47 Temperature (F): 98.2 Height (in): 68 Pulse (bpm): 75 Weight (lbs): 200 Respiratory Rate (breaths/min): 18 Body Mass Index (BMI): 30.4 Blood Pressure (mmHg): 154/100 Capillary Blood Glucose (mg/dl): 601 Reference Range: 80 - 120 mg / dl Electronic Signature(s) Signed: 05/15/2023 11:26:21 AM By: [name] CHT EMT BS , , Entered By: [name] on 05/15/2023 09:32:35

## 2023-05-18 ENCOUNTER — Encounter (HOSPITAL_BASED_OUTPATIENT_CLINIC_OR_DEPARTMENT_OTHER): Payer: 59 | Admitting: Internal Medicine

## 2023-05-18 ENCOUNTER — Other Ambulatory Visit: Payer: Self-pay

## 2023-05-18 DIAGNOSIS — S81802A Unspecified open wound, left lower leg, initial encounter: Secondary | ICD-10-CM

## 2023-05-18 DIAGNOSIS — E10621 Type 1 diabetes mellitus with foot ulcer: Secondary | ICD-10-CM | POA: Diagnosis not present

## 2023-05-18 DIAGNOSIS — I739 Peripheral vascular disease, unspecified: Secondary | ICD-10-CM

## 2023-05-18 LAB — GLUCOSE, CAPILLARY
Glucose-Capillary: 198 mg/dL — ABNORMAL HIGH (ref 70–99)
Glucose-Capillary: 256 mg/dL — ABNORMAL HIGH (ref 70–99)

## 2023-05-18 NOTE — Progress Notes (Signed)
DAQUAVION, CAVETT ([id number]) [id number] Page 1 of 2 Visit Report for 05/18/2023 HBO Details Patient Name: Date of Service: [place], [name] 05/18/2023 7:30 A M Medical Record Number: [id number] Patient Account Number: [id number] Date of Birth/Sex: Treating RN: August 04, 1966 (57 y.o. [name] Primary Care Kandance Yano: [name] Other Clinician: Referring Gloristine Turrubiates: Treating Caedon Bond/Extender: Theodis Sato in Treatment: 20 HBO Treatment Course Details Treatment Course Number: 1 Ordering Sion Reinders: Geralyn Corwin T Treatments Ordered: otal 60 HBO Treatment Start Date: 02/16/2023 HBO Indication: Other (specify in Notes) Notes: Acute peripheral arterial insufficiency HBO Treatment Details Treatment Number: 52 Patient Type: Outpatient Chamber Type: Monoplace Chamber Serial #: [id number] Treatment Protocol: 2.0 ATA with 90 minutes oxygen, with two 5 minute air breaks Treatment Details Compression Rate Down: 2.0 psi / minute De-Compression Rate Up: A breaks and breathing ir Compress Tx Pressure periods Decompress Decompress Begins Reached (leave unused spaces Begins Ends blank) Chamber Pressure (ATA 1 2 2 2 2 2  --2 1 ) Clock Time (24 hr) 08:04 08:17 08:47 08:52 09:22 09:27 - - 09:57 10:04 Treatment Length: 120 (minutes) Treatment Segments: 4 Vital Signs Capillary Blood Glucose Reference Range: 80 - 120 mg / dl HBO Diabetic Blood Glucose Intervention Range: <131 mg/dl or >220 mg/dl Time Vitals Blood Respiratory Capillary Blood Glucose Pulse Action Type: Pulse: Temperature: Taken: Pressure: Rate: Glucose (mg/dl): Meter #: Oximetry (%) Taken: Pre 07:45 116/53 78 18 98.1 198 Post 10:05 128/61 18 18 98.1 256 Treatment Response Treatment Toleration: Well Treatment Completion Status: Treatment Completed without Adverse Event Sherlon Nied Notes No concerns with treatment given Physician HBO Attestation: I certify that I  supervised this HBO treatment in accordance with Medicare guidelines. A trained emergency response team is readily available per Yes hospital policies and procedures. Continue HBOT as ordered. Yes Electronic Signature(s) Signed: 05/18/2023 4:41:28 PM By: [name] MD Previous Signature: 05/18/2023 4:15:59 PM Version By: Zenaida Deed RN, BSN Entered By: [name] on 05/18/2023 13:21:20 [name] ([id number]) [id number] Page 2 of 2 -------------------------------------------------------------------------------- HBO Safety Checklist Details Patient Name: Date of Service: [place], [name] 05/18/2023 7:30 A M Medical Record Number: [id number] Patient Account Number: [id number] Date of Birth/Sex: Treating RN: Aug 12, 1965 (57 y.o. [name] Primary Care Quanetta Truss: [name] Other Clinician: Referring Nataleah Scioneaux: Treating Vernell Townley/Extender: Theodis Sato in Treatment: 20 HBO Safety Checklist Items Safety Checklist Consent Form Signed Patient voided / foley secured and emptied When did you last eato 0700 Last dose of injectable or oral agent Ostomy pouch emptied and vented if applicable NA All implantable devices assessed, documented and approved Intravenous access site secured and place NA Valuables secured Linens and cotton and cotton/polyester blend (less than 51% polyester) Personal oil-based products / skin lotions / body lotions removed Wigs or hairpieces removed Smoking or tobacco materials removed Books / newspapers / magazines / loose paper removed Cologne, aftershave, perfume and deodorant removed Jewelry removed (may wrap wedding band) Make-up removed Hair care products removed Battery operated devices (external) removed libre Heating patches and chemical warmers removed Titanium eyewear removed Nail polish cured greater than 10 hours Casting material cured greater than 10  hours NA Hearing aids removed Loose dentures or partials removed Prosthetics have been removed NA Patient demonstrates correct use of air break device (if applicable) Patient concerns have been addressed Patient grounding bracelet on and cord attached to chamber Specifics for Inpatients (complete in addition to above) Medication sheet sent with patient NA Intravenous medications needed or due during therapy  sent with patient NA Drainage tubes (e.g. nasogastric tube or chest tube secured and vented) NA Endotracheal or Tracheotomy tube secured NA Cuff deflated of air and inflated with saline NA Airway suctioned NA Electronic Signature(s) Signed: 05/18/2023 4:15:59 PM By: Zenaida Deed RN, BSN Entered By: Zenaida Deed on 05/18/2023 06:06:36

## 2023-05-18 NOTE — Progress Notes (Signed)
[name], STOCK ([id number]) [id number] Page 1 of 2 Visit Report for 05/18/2023 Arrival Information Details Patient Name: Date of Service: [place], [name] 05/18/2023 7:30 A M Medical Record Number: [id number] Patient Account Number: [id number] Date of Birth/Sex: Treating RN: 1965/09/16 (57 y.o. [name] Primary Care Lateshia Schmoker: [name] Other Clinician: Referring Markevious Ehmke: Treating Vesna Kable/Extender: Theodis Sato in Treatment: 20 Visit Information History Since Last Visit Pain Present Now: No Patient Arrived: Ambulatory Arrival Time: 08:59 Accompanied By: self Transfer Assistance: None Patient Identification Verified: Yes Secondary Verification Process Completed: Yes Patient Requires Transmission-Based Precautions: No Patient Has Alerts: No Electronic Signature(s) Signed: 05/18/2023 4:15:59 PM By: Zenaida Deed RN, BSN Entered By: Zenaida Deed on 05/18/2023 06:00:57 -------------------------------------------------------------------------------- Encounter Discharge Information Details Patient Name: Date of Service: [name], [name] 05/18/2023 7:30 A M Medical Record Number: [id number] Patient Account Number: [id number] Date of Birth/Sex: Treating RN: 12-07-1965 (57 y.o. [name] Primary Care Twala Collings: [name] Other Clinician: Referring Jahara Dail: Treating Phu Record/Extender: Theodis Sato in Treatment: 20 Encounter Discharge Information Items Discharge Condition: Stable Ambulatory Status: Ambulatory Discharge Destination: Home Transportation: Private Auto Accompanied By: self Schedule Follow-up Appointment: Yes Clinical Summary of Care: Patient Declined Electronic Signature(s) Signed: 05/18/2023 4:15:59 PM By: Zenaida Deed RN, BSN Entered By: Zenaida Deed on 05/18/2023 12:45:20 Patient/Caregiver Education  Details -------------------------------------------------------------------------------- [name] ([id number]) [id number] Page 2 of 2 Patient Name: Date of Service: [place], [name] 10/14/2024andnbsp7:30 A M Medical Record Number: [id number] Patient Account Number: [id number] Date of Birth/Gender: Treating RN: 1966/06/26 (57 y.o. [name] Primary Care Physician: [name] Other Clinician: Referring Physician: Treating Physician/Extender: Theodis Sato in Treatment: 20 Education Assessment Education Provided To: Patient Education Topics Provided Hyperbaric Oxygenation: Methods: Explain/Verbal Responses: Reinforcements needed, State content correctly [employer]) Signed: 05/18/2023 4:15:59 PM By: Zenaida Deed RN, BSN Entered By: Zenaida Deed on 05/18/2023 12:44:56 -------------------------------------------------------------------------------- Vitals Details Patient Name: Date of Service: [name], [name] 05/18/2023 7:30 A M Medical Record Number: [id number] Patient Account Number: [id number] Date of Birth/Sex: Treating RN: April 24, 1966 (57 y.o. [name] Primary Care Orbin Mayeux: [name] Other Clinician: Referring Octavis Sheeler: Treating Sankalp Ferrell/Extender: Theodis Sato in Treatment: 20 Vital Signs Time Taken: 07:45 Temperature (F): 98.1 Height (in): 68 Pulse (bpm): 78 Weight (lbs): 200 Respiratory Rate (breaths/min): 18 Body Mass Index (BMI): 30.4 Blood Pressure (mmHg): 116/53 Capillary Blood Glucose (mg/dl): 630 Reference Range: 80 - 120 mg / dl Electronic Signature(s) Signed: 05/18/2023 4:15:59 PM By: Zenaida Deed RN, BSN Entered By: Zenaida Deed on 05/18/2023 06:02:43

## 2023-05-18 NOTE — Progress Notes (Signed)
[name], [name] ([id number]) [id number] Page 1 of 2 Visit Report for 05/18/2023 Problem List Details Patient Name: Date of Service: [name], [name] 05/18/2023 7:30 A M Medical Record Number: [id number] Patient Account Number: [id number] Date of Birth/Sex: Treating RN: [birth year] (57 y.o. 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 12/25/2022 No Yes specified severity I70.245 Atherosclerosis of native arteries of left leg with ulceration of 01/27/2023 No Yes other part of foot 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 12/25/2022 No Yes Inactive Problems ICD-10 Code Description Active Date Inactive Date J44.9 Chronic obstructive pulmonary disease, unspecified 12/25/2022 12/25/2022 M06.9 Rheumatoid arthritis, unspecified 12/25/2022 12/25/2022 N82.956 Personal history of nicotine dependence 01/27/2023 01/27/2023 Resolved Problems Electronic Signature(s) Signed: 05/18/2023 4:15:59 PM By: Zenaida Deed RN, BSN Signed: 05/18/2023 4:41:28 PM By: [name] MD Entered By: Zenaida Deed on 05/18/2023 15:44:21 -------------------------------------------------------------------------------- SuperBill Details Patient Name: Date of Service: [name] Pont, [name] [name] 05/18/2023 Medical Record Number: [id number] Patient Account Number: [id number] Date of Birth/Sex: Treating RN: Nov 22, 1965 (57 y.o. 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 Facility Procedures : CPT4 Code: [id number] Description: G0277-(Facility Use Only) HBOT full body chamber, , Modifier: Quantity: 4 Physician Procedures : CPT4 Code Description Modifier [id number] 74259 - WC PHYS HYPERBARIC OXYGEN THERAPY ICD-10 Diagnosis Description I70.245 Atherosclerosis of native arteries of left leg with ulceration of other part o L97.528 Non-pressure chronic ulcer of other part of  left foot with other specified sev 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 Quantity: 1 f foot erity Electronic Signature(s) Signed: 05/18/2023 4:15:59 PM By: Zenaida Deed RN, BSN Signed: 05/18/2023 4:41:28 PM By: [name] MD Entered By: Zenaida Deed on 05/18/2023 15:44:03

## 2023-05-19 ENCOUNTER — Encounter (HOSPITAL_BASED_OUTPATIENT_CLINIC_OR_DEPARTMENT_OTHER): Payer: 59 | Admitting: Internal Medicine

## 2023-05-19 DIAGNOSIS — E10621 Type 1 diabetes mellitus with foot ulcer: Secondary | ICD-10-CM | POA: Diagnosis not present

## 2023-05-19 LAB — GLUCOSE, CAPILLARY
Glucose-Capillary: 169 mg/dL — ABNORMAL HIGH (ref 70–99)
Glucose-Capillary: 233 mg/dL — ABNORMAL HIGH (ref 70–99)

## 2023-05-19 NOTE — Progress Notes (Addendum)
[name], [name] ([id number]) [id number] Page 1 of 2 Visit Report for 05/19/2023 HBO Details Patient Name: Date of Service: [name], [name] 05/19/2023 7:30 A M Medical Record Number: [id number] Patient Account Number: [id number] Date of Birth/Sex: Treating RN: 07/29/1966 (57 y.o. [name], [name] Primary Care [name]: [name] Other Clinician: Referring [name]: Treating [name]/Extender: [name] in Treatment: 20 HBO Treatment Course Details Treatment Course Number: 1 Ordering [name]: [name] T Treatments Ordered: otal 60 HBO Treatment Start Date: 02/16/2023 HBO Indication: Other (specify in Notes) Notes: Acute peripheral arterial insufficiency HBO Treatment Details Treatment Number: 53 Patient Type: Outpatient Chamber Type: Monoplace Chamber Serial #: [id number] Treatment Protocol: 2.0 ATA with 90 minutes oxygen, with two 5 minute air breaks Treatment Details Compression Rate Down: 1.5 psi / minute De-Compression Rate Up: 1.5 psi / minute A breaks and breathing ir Compress Tx Pressure periods Decompress Decompress Begins Reached (leave unused spaces Begins Ends blank) Chamber Pressure (ATA 1 2 2 2 2 2  --2 1 ) Clock Time (24 hr) 08:13 08:26 08:56 09:01 09:31 09:36 - - 10:06 10:20 Treatment Length: 127 (minutes) Treatment Segments: 4 Vital Signs Capillary Blood Glucose Reference Range: 80 - 120 mg / dl HBO Diabetic Blood Glucose Intervention Range: <131 mg/dl or >782 mg/dl Time Vitals Blood Respiratory Capillary Blood Glucose Pulse Action Type: Pulse: Temperature: Taken: Pressure: Rate: Glucose (mg/dl): Meter #: Oximetry (%) Taken: Pre 07:54 130/59 72 17 97.7 169 Post 10:20 130/59 68 17 97.3 233 Treatment Response Treatment Toleration: Well Treatment Completion Status: Treatment Completed without Adverse Event [name] Notes No concerns with treatment given Physician HBO  Attestation: I certify that I supervised this HBO treatment in accordance with Medicare guidelines. A trained emergency response team is readily available per Yes hospital policies and procedures. Continue HBOT as ordered. Yes Electronic Signature(s) Signed: 05/19/2023 4:40:46 PM By: [name] Najjar MD Previous Signature: 05/19/2023 11:33:08 AM Version By: [name] Mu RN Previous Signature: 05/19/2023 8:49:41 AM Version By: [name] Mu RN Entered By: [name] on 05/19/2023 13:19:59 [name], [name] Garter ([id number]) [id number] Page 2 of 2 -------------------------------------------------------------------------------- HBO Safety Checklist Details Patient Name: Date of Service: [name], [name] 05/19/2023 7:30 A M Medical Record Number: [id number] Patient Account Number: [id number] Date of Birth/Sex: Treating RN: Dec 24, 1965 (57 y.o. [name], [name] Primary Care Michole [name]: [name] Other Clinician: Referring [name]: Treating [name]: [name] in Treatment: 20 HBO Safety Checklist Items Safety Checklist Consent Form Signed Patient voided / foley secured and emptied When did you last eato 0700; waffles protein shake coffee Last dose of injectable or oral agent 0630-Lantus Ostomy pouch emptied and vented if applicable NA All implantable devices assessed, documented and approved NA Intravenous access site secured and place NA Valuables secured Linens and cotton and cotton/polyester blend (less than 51% polyester) Personal oil-based products / skin lotions / body lotions removed NA Wigs or hairpieces removed NA Smoking or tobacco materials removed NA Books / newspapers / magazines / loose paper removed NA Cologne, aftershave, perfume and deodorant removed NA Jewelry removed (may wrap wedding band) NA Make-up removed NA Hair care products removed NA Battery operated devices  (external) removed NA Heating patches and chemical warmers removed NA Titanium eyewear removed NA Nail polish cured greater than 10 hours NA Casting material cured greater than 10 hours NA Hearing aids removed NA Loose dentures or partials removed NA Prosthetics have been removed NA Patient demonstrates correct use of air break device (if applicable) Patient concerns have  been addressed Patient grounding bracelet on and cord attached to chamber Specifics for Inpatients (complete in addition to above) Medication sheet sent with patient Intravenous medications needed or due during therapy sent with patient Drainage tubes (e.g. nasogastric tube or chest tube secured and vented) Endotracheal or Tracheotomy tube secured Cuff deflated of air and inflated with saline Airway suctioned Electronic Signature(s) Signed: 05/19/2023 8:48:12 AM By: [name] Mu RN Entered By: [name] on 05/19/2023 05:48:12

## 2023-05-19 NOTE — Progress Notes (Signed)
AZHAR, KNOPE ([id number]) [id number] Page 1 of 1 Visit Report for 05/19/2023 SuperBill Details Patient Name: Date of Service: [name], [name] 05/19/2023 Medical Record Number: [id number] Patient Account Number: [id number] Date of Birth/Sex: Treating RN: 1966-07-13 (57 y.o. 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 Facility Procedures CPT4 Code Description Modifier Quantity [id number] G0277-(Facility Use Only) HBOT full body chamber, , 4 Physician Procedures Quantity CPT4 Code Description Modifier [id number] [phone] - WC PHYS HYPERBARIC OXYGEN THERAPY 1 ICD-10 Diagnosis Description 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

## 2023-05-19 NOTE — Progress Notes (Signed)
[name] ([id number]) [id number] Page 1 of 1 Visit Report for 05/19/2023 Arrival Information Details Patient Name: Date of Service: [name] 05/19/2023 7:30 A M Medical Record Number: [id number] Patient Account Number: [id number] Date of Birth/Sex: Treating RN: Aug 26, 1965 (57 y.o. [name], [name] Primary Care [name]: [name] Other Clinician: Referring [name]: Treating [name]/Extender: [name] in Treatment: 20 Visit Information History Since Last Visit Added or deleted any medications: No Patient Arrived: Ambulatory Any new allergies or adverse reactions: No Arrival Time: 07:54 Had a fall or experienced change in No Accompanied By: self activities of daily living that may affect Transfer Assistance: None risk of falls: Patient Identification Verified: Yes Signs or symptoms of abuse/neglect since last visito No Secondary Verification Process Completed: Yes Hospitalized since last visit: No Patient Requires Transmission-Based Precautions: No Implantable device outside of the clinic excluding No Patient Has Alerts: No cellular tissue based products placed in the center since last visit: Has Dressing in Place as Prescribed: Yes Pain Present Now: No Electronic Signature([name]) Signed: 05/19/2023 8:45:48 AM By: [name] Mu RN Entered By: [name] on 05/19/2023 05:45:47 -------------------------------------------------------------------------------- Vitals Details Patient Name: Date of Service: [name] Pont, PA [name] J. 05/19/2023 7:30 A M Medical Record Number: [id number] Patient Account Number: [id number] Date of Birth/Sex: Treating RN: Oct 23, 1965 (57 y.o. [name], [name] Primary Care [name]: [name] Other Clinician: Referring Saylee [name]: Treating [name]/Extender: [name] in Treatment: 20 Vital Signs Time Taken: 07:54 Temperature  (F): 97.7 Height (in): 68 Pulse (bpm): 72 Weight (lbs): 200 Respiratory Rate (breaths/min): 17 Body Mass Index (BMI): 30.4 Blood Pressure (mmHg): 130/59 Capillary Blood Glucose (mg/dl): 086 Reference Range: 80 - 120 mg / dl Electronic Signature([name]) Signed: 05/19/2023 8:46:22 AM By: [name] Mu RN Entered By: [name] on 05/19/2023 05:46:21

## 2023-05-20 ENCOUNTER — Ambulatory Visit: Payer: 59 | Admitting: Family

## 2023-05-20 ENCOUNTER — Encounter: Payer: Self-pay | Admitting: Family

## 2023-05-20 DIAGNOSIS — L97423 Non-pressure chronic ulcer of left heel and midfoot with necrosis of muscle: Secondary | ICD-10-CM

## 2023-05-20 NOTE — Progress Notes (Signed)
Post-Op Visit Note   Patient: [name]           Date of Birth: 1966/07/01           MRN: [id number] Visit Date: 05/20/2023 PCP: Garlan Fillers, MD  Chief Complaint:  Chief Complaint  Patient presents with   Left Ankle - Routine Post Op    03/04/23 left achilles debridement    HPI:  HPI The patient is a 57 year old gentleman who is seen in follow-up status post left Achilles debridement on July 31 he has had Kerecis micro powder applied in the office, donated, weekly  He also continues with home exercise program as well as hyperbaric oxygen treatments, has quit smoking. Was seen by vascular last week. No new recs.     Ortho Exam On examination of the left Achilles area of the ulcer is 100% filled in with granulation there is good uptake of the Kerecis powder.  There is no undermining.  The measurements today are 1.5 centimeters by 1.2 cm in width there is 2 mm of depth some mild surrounding erythema however appears to be healing circumferentially No erythema warmth or sign of infection  Donation Kerecis micro powder applied again today.  Visit Diagnoses: No diagnosis found.  Plan: cleansed with vashe. Kerecis micro powder donation reapplied today.  Will change overlying gauze daily as instructed daily for the first week he will follow-up in the office in 1 more week for reevaluation  Follow-Up Instructions: No follow-ups on file.   Imaging: No results found.  Orders:  No orders of the defined types were placed in this encounter.  No orders of the defined types were placed in this encounter.    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 03/22/2020   Osteopenia of multiple sites 02/28/2020   PVD (peripheral vascular disease)  (HCC) 10/11/2019   Chronic venous insufficiency 10/05/2018   Diabetic cataract (HCC) 05/21/2018   Controlled type 1 diabetes mellitus with diabetic peripheral angiopathy without gangrene (HCC) 08/14/2017   Dyslipidemia 03/26/2017   High risk medication use 09/23/2016   History of hypertension 09/23/2016   History of chronic kidney disease 09/23/2016   History of coronary artery disease 09/23/2016   Tobacco abuse 09/23/2016   Primary osteoarthritis of both knees 09/23/2016   History of diabetes mellitus 09/23/2016   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 (HCC) 11/20/2015   Chest pain with high risk for cardiac etiology 10/29/2015   Asymptomatic bilateral carotid artery stenosis 06/08/2014   Pleural effusion, bilateral 06/03/2014   Dyspnea 06/01/2014   Diabetes (HCC) 05/03/2014   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/03/2014   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   Chronic renal disease, stage 3, moderately decreased glomerular filtration rate between 30-59 mL/min/1.73 square meter (HCC) 05/03/2014   CAD (coronary artery disease) 04/27/2014   Acne 12/19/2013   On isotretinoin therapy 12/19/2013   Nuclear sclerotic cataract, bilateral 12/19/2011   Vitreous hemorrhage (HCC) 06/16/2011   Past Medical History:  Diagnosis Date   Anginal pain (HCC)    Anxiety    Arthritis    RA IN HANDS   Asthma    CAD (coronary artery disease) 04/27/2014  Chronic renal disease, stage 3, moderately decreased glomerular filtration rate between 30-59 mL/min/1.73 square meter (HCC) 05/03/2014   COPD (chronic obstructive pulmonary disease) (HCC)    Coronary artery disease    Diabetes (HCC) 05/03/2014   Diabetes mellitus without complication (HCC)    type 1   Hyperlipidemia    Left shoulder pain    Peripheral vascular disease (HCC)    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/03/2014    Shortness of breath    Sleep apnea    mild OSA, no CPAP   Unstable angina pectoris (HCC) 04/25/2014    Family History  Problem Relation Age of Onset   Stomach cancer Mother    Cancer Mother    Prostate cancer Father    Heart disease Father    Cancer - Prostate Father    Heart disease Brother    Asthma Daughter    Healthy Daughter    Asthma Daughter     Past Surgical History:  Procedure Laterality Date   ABDOMINAL AORTOGRAM W/LOWER EXTREMITY N/A 01/15/2023   Procedure: ABDOMINAL AORTOGRAM W/LOWER EXTREMITY;  Surgeon: Cephus Shelling, MD;  Location: [facility];  Service: Cardiovascular;  Laterality: N/A;   CARDIAC CATHETERIZATION  04/25/2014   BY DR Jacinto Halim   CARDIAC CATHETERIZATION N/A 10/30/2015   Procedure: Left Heart Cath and Cors/Grafts Angiography;  Surgeon: Yates Decamp, MD;  Location: [facility] INVASIVE CV LAB;  Service: Cardiovascular;  Laterality: N/A;   CARPAL TUNNEL RELEASE     CATARACT EXTRACTION, BILATERAL     CORONARY ARTERY BYPASS GRAFT N/A 04/27/2014   Procedure: CORONARY ARTERY BYPASS GRAFTING on pump using left internal mammary artery to LAD coronary artery, right great saphenous vein graft to diagonal coronary artery with sequential to OM1 and circumflex coronary arteries. Right greater saphenous vein graft to posterior descending coronary artery. ;  Surgeon: Delight Ovens, MD;  Location: [facility] OR;  Service: Open Heart Surgery;  Laterality: N   elbow drained Left 05/09/2019   ENDOVEIN HARVEST OF GREATER SAPHENOUS VEIN Right 04/27/2014   Procedure: ENDOVEIN HARVEST OF GREATER SAPHENOUS VEIN;  Surgeon: Delight Ovens, MD;  Location: [facility] OR;  Service: Open Heart Surgery;  Laterality: Right;   EXCISION ORAL TUMOR N/A 03/01/2018   Procedure: EXCISION ORAL TUMOR;  Surgeon: Christia Reading, MD;  Location: [facility];  Service: ENT;  Laterality: N/A;   EYE SURGERY     LASER   EYE SURGERY Bilateral    astigmatism correction    HARDWARE REMOVAL Left  10/03/2020   Procedure: LEFT FOOT REMOVAL HARDWARE, PLACE VANC POWDER;  Surgeon: Nadara Mustard, MD;  Location: [facility] OR;  Service: Orthopedics;  Laterality: Left;   I & D EXTREMITY Left 03/04/2023   Procedure: LEFT ACHILLES DEBRIDEMENT;  Surgeon: Nadara Mustard, MD;  Location[facility] OR;  Service: Orthopedics;  Laterality: Left;   INTRAOPERATIVE TRANSESOPHAGEAL ECHOCARDIOGRAM N/A 04/27/2014   Procedure: INTRAOPERATIVE TRANSESOPHAGEAL ECHOCARDIOGRAM;  Surgeon: Delight Ovens, MD;  Location: [facility] OR;  Service: Open Heart Surgery;  Laterality: N/A;   IR RADIOLOGIST EVAL & MGMT  12/22/2022   LEFT HEART CATHETERIZATION WITH CORONARY ANGIOGRAM N/A 04/25/2014   Procedure: LEFT HEART CATHETERIZATION WITH CORONARY ANGIOGRAM;  Surgeon: Pamella Pert, MD;  Location: [facility] CATH LAB;  Service: Cardiovascular;  Laterality: N/A;   MOUTH SURGERY  11/15/2017   tongue surgery    ORIF TOE FRACTURE Left 03/22/2020   Procedure: OPEN REDUCTION INTERNAL FIXATION (ORIF) Non Union 5th Metatarsal;  Surgeon: Doneen Poisson  Y, MD;  Location: [facility];  Service: Orthopedics;  Laterality: Left;   Social History   Occupational History   Not on file  Tobacco Use   Smoking status: Former    Current packs/day: 0.25    Average packs/day: 0.3 packs/day for 34.0 years (8.5 ttl pk-yrs)    Types: Cigarettes   Smokeless tobacco: Never  Vaping Use   Vaping status: Never Used  Substance and Sexual Activity   Alcohol use: Not Currently    Comment: RARE   Drug use: No   Sexual activity: Not on file

## 2023-05-20 NOTE — Progress Notes (Signed)
history of confinement anxiety Assess patient's knowledge and expectations regarding hyperbaric medicine and provide education related to the hyperbaric environment, goals of treatment and prevention of adverse events Implement protocols to decrease risk of pneumothorax in high risk patients Notes: Wound/Skin Impairment Nursing Diagnoses: Impaired tissue integrity Knowledge deficit related to smoking impact on wound healing Knowledge deficit related to ulceration/compromised skin integrity Goals: Patient will have a decrease in wound volume by X% from date: (specify in notes) Date Initiated: 12/25/2022 Target Resolution Date: 07/14/2023 Goal  Status: Active Patient/caregiver will verbalize understanding of skin care regimen Date Initiated: 12/25/2022 Target Resolution Date: 07/14/2023 Goal Status: Active Ulcer/skin breakdown will have a volume reduction of 30% by week 4 Date Initiated: 12/25/2022 Date Inactivated: 01/27/2023 Target Resolution Date: 01/22/2023 Unmet Reason: no major changes; Goal Status: Unmet continue current treatment. Interventions: Assess patient/caregiver ability to obtain necessary supplies Assess patient/caregiver ability to perform ulcer/skin care regimen upon admission and as needed Assess ulceration(s) every visit Provide education on smoking Provide education on ulcer and skin care Notes: Electronic Signature(s) Signed: 05/20/2023 4:07:14 PM By: Brenton Grills Entered By: Brenton Grills on 05/18/2023 10:31:51 -------------------------------------------------------------------------------- Pain Assessment Details Patient Name: Date of Service: [name] Pont, [name] [name] 05/18/2023 9:45 A M Medical Record Number: [id number] Patient Account Number: [id number] Date of Birth/Sex: Treating RN: 22-Nov-1965 (57 y.o. Steed, Kanaan, [name] ([id number]) [id number] Page 7 of 9 Primary Care Nadra Hritz: [name] Other Clinician: Referring Saraih Lorton: Treating Ezmae Speers/Extender: Theodis Sato in Treatment: 20 Active Problems Location of Pain Severity and Description of Pain Patient Has Paino No Site Locations Pain Management and Medication Current Pain Management: Electronic Signature(s) Signed: 05/20/2023 4:07:14 PM By: Brenton Grills Entered By: Brenton Grills on 05/18/2023 10:28:46 -------------------------------------------------------------------------------- Patient/Caregiver Education Details Patient Name: Date of Service: [name], [name] 10/14/2024andnbsp9:45 A M Medical Record Number: [id number] Patient Account Number:  [id number] Date of Birth/Gender: Treating RN: 25-Dec-1965 (57 y.o. Yates Decamp Primary Care Physician: [name] Other Clinician: Referring Physician: Treating Physician/Extender: Theodis Sato in Treatment: 20 Education Assessment Education Provided To: Patient Education Topics Provided Wound/Skin Impairment: Methods: Explain/Verbal Responses: State content correctly Electronic Signature(s) Signed: 05/20/2023 4:07:14 PM By: Brenton Grills Entered By: Brenton Grills on 05/18/2023 10:32:12 [name] ([id number]) [id number] Page 8 of 9 -------------------------------------------------------------------------------- Wound Assessment Details Patient Name: Date of Service: [name], [name] 05/18/2023 9:45 A M Medical Record Number: [id number] Patient Account Number: [id number] Date of Birth/Sex: Treating RN: May 02, 1966 (57 y.o. 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 Wounding Event: Gradually Appeared Etiology: Date Acquired: 12/07/2022 Wound Open Weeks Of Treatment: 20 Status: Clustered Wound: No Comorbid Cataracts, Coronary Artery Disease, Hypertension, Peripheral History: Venous Disease, Type I Diabetes, Rheumatoid Arthritis, Osteoarthritis Wound Measurements Length: (cm) 1.2 Width: (cm) 0.9 Depth: (cm) 0.2 Area: (cm) 0.848 Volume: (cm) 0.17 % Reduction in Area: -68.6% % Reduction in Volume: -240% Epithelialization: Small (1-33%) Tunneling: No Undermining: No Wound Description Classification: Grade 2 Wound Margin: Distinct, outline attached Exudate Amount: Medium Exudate Type: Serous Exudate  Color: amber Foul Odor After Cleansing: No Slough/Fibrino Yes Wound Bed Granulation Amount: Large (67-100%) Exposed Structure Granulation Quality: Red, Pink Fascia Exposed: No Necrotic Amount: Small (1-33%) Fat Layer (Subcutaneous Tissue) Exposed: Yes Necrotic Quality: Adherent Slough Tendon Exposed: No Muscle Exposed: No  Cleansing / Measurement X - Simple Wound Cleansing - one wound 1 5 []  - 0 Complex Wound Cleansing - multiple wounds X- 1 5 Wound Imaging (photographs - any number of wounds) []  - 0 Wound Tracing (instead of photographs) X- 1 5 Simple Wound Measurement - one wound []  - 0 Complex Wound Measurement - multiple wounds INTERVENTIONS - Wound Dressings []  - 0 Small Wound Dressing one or multiple wounds X- 1 15 Medium Wound Dressing one or multiple wounds []  - 0 Large Wound Dressing one or multiple wounds []  - 0 Application of Medications - topical []  - 0 Application of Medications - injection INTERVENTIONS - Miscellaneous []  - 0 External ear exam []  - 0 Specimen Collection (cultures, biopsies, blood, body fluids, etc.) []  - 0 Specimen(s) / Culture(s) sent or taken to Lab for analysis []  - 0 Patient Transfer (multiple staff / Nurse, adult / Similar devices) []  - 0 Simple Staple / Suture removal (25 or less) []  - 0 Complex Staple / Suture removal (26 or more) []  - 0 Hypo / Hyperglycemic Management (close monitor of Blood Glucose) [name], [name] ([id number]) [id number] Page 3 of 9 []  - 0 Ankle / Brachial Index (ABI) - do not check if billed separately X- 1 5 Vital Signs Has the patient been seen at the hospital within the last three years: Yes Total Score: 105 Level Of Care: New/Established - Level 3 Electronic Signature(s) Signed: 05/20/2023 4:07:14 PM By: Brenton Grills Entered By: Brenton Grills on 05/18/2023 10:49:14 -------------------------------------------------------------------------------- Encounter Discharge Information Details Patient Name: Date of Service: [name] Pont, [name] [name] 05/18/2023 9:45 A  M Medical Record Number: [id number] Patient Account Number: [id number] Date of Birth/Sex: Treating RN: Jan 16, 1966 (57 y.o. Yates Decamp Primary Care Fleta Borgeson: [name] Other Clinician: Referring Isiaah Cuervo: Treating Briya Lookabaugh/Extender: Theodis Sato in Treatment: 20 Encounter Discharge Information Items Discharge Condition: Stable Ambulatory Status: Ambulatory Discharge Destination: Home Transportation: Private Auto Accompanied By: self Schedule Follow-up Appointment: Yes Clinical Summary of Care: Patient Declined Electronic Signature(s) Signed: 05/20/2023 4:07:14 PM By: Brenton Grills Entered By: Brenton Grills on 05/18/2023 10:50:12 -------------------------------------------------------------------------------- Lower Extremity Assessment Details Patient Name: Date of Service: [name], [name] 05/18/2023 9:45 A M Medical Record Number: [id number] Patient Account Number: [id number] Date of Birth/Sex: Treating RN: 10-Sep-1965 (57 y.o. Yates Decamp Primary Care Buck Mcaffee: [name] Other Clinician: Referring Gari Hartsell: Treating Gaetan Spieker/Extender: Theodis Sato in Treatment: 20 Edema Assessment Assessed: [name]: No] Franne Forts: No] Edema: [Left: N] [Right: o] Calf Left: Right: Point of Measurement: 32 cm From Medial Instep 33 cm Ankle Left: Right: Point of Measurement: 13 cm From Medial Instep 21 cm Vascular Assessment Left: [id number] Page 4 of 9Right:] Extremity colors, hair growth, and conditions: Extremity Color: [phone].pdf Page 4 of 9Normal] Hair Growth on Extremity: [phone].pdf Page 4 of 9Yes] Temperature of Extremity: [phone].pdf Page 4 of 9Warm] Capillary Refill: [phone].pdf Page 4 of 9< 3 seconds] Dependent Rubor: [phone].pdf Page 4 of 9No  No] Toe Nail Assessment Left: Right: Thick: Yes Discolored: Yes Deformed: Yes Improper Length and Hygiene: Yes Electronic Signature(s) Signed: 05/20/2023 4:07:14 PM By: Brenton Grills Entered By: Brenton Grills on 05/18/2023 10:30:23 -------------------------------------------------------------------------------- Multi Wound Chart Details Patient Name: Date of Service: [name] Pont, [name] [name] 05/18/2023 9:45 A M Medical Record Number: [id number] Patient Account Number: [id number] Date of Birth/Sex: Treating RN: [birth year] (57 y.o. M) Primary Care Tresean Mattix: [name] Other Clinician: Referring Pranathi Winfree: Treating Chasin Findling/Extender: Theodis Sato in Treatment: 20 Vital Signs Height(in): 68  history of confinement anxiety Assess patient's knowledge and expectations regarding hyperbaric medicine and provide education related to the hyperbaric environment, goals of treatment and prevention of adverse events Implement protocols to decrease risk of pneumothorax in high risk patients Notes: Wound/Skin Impairment Nursing Diagnoses: Impaired tissue integrity Knowledge deficit related to smoking impact on wound healing Knowledge deficit related to ulceration/compromised skin integrity Goals: Patient will have a decrease in wound volume by X% from date: (specify in notes) Date Initiated: 12/25/2022 Target Resolution Date: 07/14/2023 Goal  Status: Active Patient/caregiver will verbalize understanding of skin care regimen Date Initiated: 12/25/2022 Target Resolution Date: 07/14/2023 Goal Status: Active Ulcer/skin breakdown will have a volume reduction of 30% by week 4 Date Initiated: 12/25/2022 Date Inactivated: 01/27/2023 Target Resolution Date: 01/22/2023 Unmet Reason: no major changes; Goal Status: Unmet continue current treatment. Interventions: Assess patient/caregiver ability to obtain necessary supplies Assess patient/caregiver ability to perform ulcer/skin care regimen upon admission and as needed Assess ulceration(s) every visit Provide education on smoking Provide education on ulcer and skin care Notes: Electronic Signature(s) Signed: 05/20/2023 4:07:14 PM By: Brenton Grills Entered By: Brenton Grills on 05/18/2023 10:31:51 -------------------------------------------------------------------------------- Pain Assessment Details Patient Name: Date of Service: [name] Pont, [name] [name] 05/18/2023 9:45 A M Medical Record Number: [id number] Patient Account Number: [id number] Date of Birth/Sex: Treating RN: 22-Nov-1965 (57 y.o. Steed, Kanaan, [name] ([id number]) [id number] Page 7 of 9 Primary Care Nadra Hritz: [name] Other Clinician: Referring Saraih Lorton: Treating Ezmae Speers/Extender: Theodis Sato in Treatment: 20 Active Problems Location of Pain Severity and Description of Pain Patient Has Paino No Site Locations Pain Management and Medication Current Pain Management: Electronic Signature(s) Signed: 05/20/2023 4:07:14 PM By: Brenton Grills Entered By: Brenton Grills on 05/18/2023 10:28:46 -------------------------------------------------------------------------------- Patient/Caregiver Education Details Patient Name: Date of Service: [name], [name] 10/14/2024andnbsp9:45 A M Medical Record Number: [id number] Patient Account Number:  [id number] Date of Birth/Gender: Treating RN: 25-Dec-1965 (57 y.o. Yates Decamp Primary Care Physician: [name] Other Clinician: Referring Physician: Treating Physician/Extender: Theodis Sato in Treatment: 20 Education Assessment Education Provided To: Patient Education Topics Provided Wound/Skin Impairment: Methods: Explain/Verbal Responses: State content correctly Electronic Signature(s) Signed: 05/20/2023 4:07:14 PM By: Brenton Grills Entered By: Brenton Grills on 05/18/2023 10:32:12 [name] ([id number]) [id number] Page 8 of 9 -------------------------------------------------------------------------------- Wound Assessment Details Patient Name: Date of Service: [name], [name] 05/18/2023 9:45 A M Medical Record Number: [id number] Patient Account Number: [id number] Date of Birth/Sex: Treating RN: May 02, 1966 (57 y.o. 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 Wounding Event: Gradually Appeared Etiology: Date Acquired: 12/07/2022 Wound Open Weeks Of Treatment: 20 Status: Clustered Wound: No Comorbid Cataracts, Coronary Artery Disease, Hypertension, Peripheral History: Venous Disease, Type I Diabetes, Rheumatoid Arthritis, Osteoarthritis Wound Measurements Length: (cm) 1.2 Width: (cm) 0.9 Depth: (cm) 0.2 Area: (cm) 0.848 Volume: (cm) 0.17 % Reduction in Area: -68.6% % Reduction in Volume: -240% Epithelialization: Small (1-33%) Tunneling: No Undermining: No Wound Description Classification: Grade 2 Wound Margin: Distinct, outline attached Exudate Amount: Medium Exudate Type: Serous Exudate  Color: amber Foul Odor After Cleansing: No Slough/Fibrino Yes Wound Bed Granulation Amount: Large (67-100%) Exposed Structure Granulation Quality: Red, Pink Fascia Exposed: No Necrotic Amount: Small (1-33%) Fat Layer (Subcutaneous Tissue) Exposed: Yes Necrotic Quality: Adherent Slough Tendon Exposed: No Muscle Exposed: No  history of confinement anxiety Assess patient's knowledge and expectations regarding hyperbaric medicine and provide education related to the hyperbaric environment, goals of treatment and prevention of adverse events Implement protocols to decrease risk of pneumothorax in high risk patients Notes: Wound/Skin Impairment Nursing Diagnoses: Impaired tissue integrity Knowledge deficit related to smoking impact on wound healing Knowledge deficit related to ulceration/compromised skin integrity Goals: Patient will have a decrease in wound volume by X% from date: (specify in notes) Date Initiated: 12/25/2022 Target Resolution Date: 07/14/2023 Goal  Status: Active Patient/caregiver will verbalize understanding of skin care regimen Date Initiated: 12/25/2022 Target Resolution Date: 07/14/2023 Goal Status: Active Ulcer/skin breakdown will have a volume reduction of 30% by week 4 Date Initiated: 12/25/2022 Date Inactivated: 01/27/2023 Target Resolution Date: 01/22/2023 Unmet Reason: no major changes; Goal Status: Unmet continue current treatment. Interventions: Assess patient/caregiver ability to obtain necessary supplies Assess patient/caregiver ability to perform ulcer/skin care regimen upon admission and as needed Assess ulceration(s) every visit Provide education on smoking Provide education on ulcer and skin care Notes: Electronic Signature(s) Signed: 05/20/2023 4:07:14 PM By: Brenton Grills Entered By: Brenton Grills on 05/18/2023 10:31:51 -------------------------------------------------------------------------------- Pain Assessment Details Patient Name: Date of Service: [name] Pont, [name] [name] 05/18/2023 9:45 A M Medical Record Number: [id number] Patient Account Number: [id number] Date of Birth/Sex: Treating RN: 22-Nov-1965 (57 y.o. Steed, Kanaan, [name] ([id number]) [id number] Page 7 of 9 Primary Care Nadra Hritz: [name] Other Clinician: Referring Saraih Lorton: Treating Ezmae Speers/Extender: Theodis Sato in Treatment: 20 Active Problems Location of Pain Severity and Description of Pain Patient Has Paino No Site Locations Pain Management and Medication Current Pain Management: Electronic Signature(s) Signed: 05/20/2023 4:07:14 PM By: Brenton Grills Entered By: Brenton Grills on 05/18/2023 10:28:46 -------------------------------------------------------------------------------- Patient/Caregiver Education Details Patient Name: Date of Service: [name], [name] 10/14/2024andnbsp9:45 A M Medical Record Number: [id number] Patient Account Number:  [id number] Date of Birth/Gender: Treating RN: 25-Dec-1965 (57 y.o. Yates Decamp Primary Care Physician: [name] Other Clinician: Referring Physician: Treating Physician/Extender: Theodis Sato in Treatment: 20 Education Assessment Education Provided To: Patient Education Topics Provided Wound/Skin Impairment: Methods: Explain/Verbal Responses: State content correctly Electronic Signature(s) Signed: 05/20/2023 4:07:14 PM By: Brenton Grills Entered By: Brenton Grills on 05/18/2023 10:32:12 [name] ([id number]) [id number] Page 8 of 9 -------------------------------------------------------------------------------- Wound Assessment Details Patient Name: Date of Service: [name], [name] 05/18/2023 9:45 A M Medical Record Number: [id number] Patient Account Number: [id number] Date of Birth/Sex: Treating RN: May 02, 1966 (57 y.o. 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 Wounding Event: Gradually Appeared Etiology: Date Acquired: 12/07/2022 Wound Open Weeks Of Treatment: 20 Status: Clustered Wound: No Comorbid Cataracts, Coronary Artery Disease, Hypertension, Peripheral History: Venous Disease, Type I Diabetes, Rheumatoid Arthritis, Osteoarthritis Wound Measurements Length: (cm) 1.2 Width: (cm) 0.9 Depth: (cm) 0.2 Area: (cm) 0.848 Volume: (cm) 0.17 % Reduction in Area: -68.6% % Reduction in Volume: -240% Epithelialization: Small (1-33%) Tunneling: No Undermining: No Wound Description Classification: Grade 2 Wound Margin: Distinct, outline attached Exudate Amount: Medium Exudate Type: Serous Exudate  Color: amber Foul Odor After Cleansing: No Slough/Fibrino Yes Wound Bed Granulation Amount: Large (67-100%) Exposed Structure Granulation Quality: Red, Pink Fascia Exposed: No Necrotic Amount: Small (1-33%) Fat Layer (Subcutaneous Tissue) Exposed: Yes Necrotic Quality: Adherent Slough Tendon Exposed: No Muscle Exposed: No  Cleansing / Measurement X - Simple Wound Cleansing - one wound 1 5 []  - 0 Complex Wound Cleansing - multiple wounds X- 1 5 Wound Imaging (photographs - any number of wounds) []  - 0 Wound Tracing (instead of photographs) X- 1 5 Simple Wound Measurement - one wound []  - 0 Complex Wound Measurement - multiple wounds INTERVENTIONS - Wound Dressings []  - 0 Small Wound Dressing one or multiple wounds X- 1 15 Medium Wound Dressing one or multiple wounds []  - 0 Large Wound Dressing one or multiple wounds []  - 0 Application of Medications - topical []  - 0 Application of Medications - injection INTERVENTIONS - Miscellaneous []  - 0 External ear exam []  - 0 Specimen Collection (cultures, biopsies, blood, body fluids, etc.) []  - 0 Specimen(s) / Culture(s) sent or taken to Lab for analysis []  - 0 Patient Transfer (multiple staff / Nurse, adult / Similar devices) []  - 0 Simple Staple / Suture removal (25 or less) []  - 0 Complex Staple / Suture removal (26 or more) []  - 0 Hypo / Hyperglycemic Management (close monitor of Blood Glucose) [name], [name] ([id number]) [id number] Page 3 of 9 []  - 0 Ankle / Brachial Index (ABI) - do not check if billed separately X- 1 5 Vital Signs Has the patient been seen at the hospital within the last three years: Yes Total Score: 105 Level Of Care: New/Established - Level 3 Electronic Signature(s) Signed: 05/20/2023 4:07:14 PM By: Brenton Grills Entered By: Brenton Grills on 05/18/2023 10:49:14 -------------------------------------------------------------------------------- Encounter Discharge Information Details Patient Name: Date of Service: [name] Pont, [name] [name] 05/18/2023 9:45 A  M Medical Record Number: [id number] Patient Account Number: [id number] Date of Birth/Sex: Treating RN: Jan 16, 1966 (57 y.o. Yates Decamp Primary Care Fleta Borgeson: [name] Other Clinician: Referring Isiaah Cuervo: Treating Briya Lookabaugh/Extender: Theodis Sato in Treatment: 20 Encounter Discharge Information Items Discharge Condition: Stable Ambulatory Status: Ambulatory Discharge Destination: Home Transportation: Private Auto Accompanied By: self Schedule Follow-up Appointment: Yes Clinical Summary of Care: Patient Declined Electronic Signature(s) Signed: 05/20/2023 4:07:14 PM By: Brenton Grills Entered By: Brenton Grills on 05/18/2023 10:50:12 -------------------------------------------------------------------------------- Lower Extremity Assessment Details Patient Name: Date of Service: [name], [name] 05/18/2023 9:45 A M Medical Record Number: [id number] Patient Account Number: [id number] Date of Birth/Sex: Treating RN: 10-Sep-1965 (57 y.o. Yates Decamp Primary Care Buck Mcaffee: [name] Other Clinician: Referring Gari Hartsell: Treating Gaetan Spieker/Extender: Theodis Sato in Treatment: 20 Edema Assessment Assessed: [name]: No] Franne Forts: No] Edema: [Left: N] [Right: o] Calf Left: Right: Point of Measurement: 32 cm From Medial Instep 33 cm Ankle Left: Right: Point of Measurement: 13 cm From Medial Instep 21 cm Vascular Assessment Left: [[id number] Page 4 of 9Right:] Extremity colors, hair growth, and conditions: Extremity Color: [phone].pdf Page 4 of 9Normal] Hair Growth on Extremity: [phone].pdf Page 4 of 9Yes] Temperature of Extremity: [phone].pdf Page 4 of 9Warm] Capillary Refill: [phone].pdf Page 4 of 9< 3 seconds] Dependent Rubor: [phone].pdf Page 4 of 9No  No] Toe Nail Assessment Left: Right: Thick: Yes Discolored: Yes Deformed: Yes Improper Length and Hygiene: Yes Electronic Signature(s) Signed: 05/20/2023 4:07:14 PM By: Brenton Grills Entered By: Brenton Grills on 05/18/2023 10:30:23 -------------------------------------------------------------------------------- Multi Wound Chart Details Patient Name: Date of Service: [name] Pont, [name] [name] 05/18/2023 9:45 A M Medical Record Number: [id number] Patient Account Number: [id number] Date of Birth/Sex: Treating RN: [birth year] (57 y.o. M) Primary Care Tresean Mattix: [name] Other Clinician: Referring Pranathi Winfree: Treating Chasin Findling/Extender: Theodis Sato in Treatment: 20 Vital Signs Height(in): 68

## 2023-05-20 NOTE — Progress Notes (Signed)
follow hospital policy. *Juice or candies are NOT equivalent products. If patient refuses the Glucerna or Ensure, please consult the hospital dietitian for an appropriate substitute. Electronic Signature(s) Signed: 05/18/2023 4:41:28 PM By: [name] MD Signed: 05/20/2023 4:07:14 PM By: Brenton Grills Entered By: Brenton Grills on 05/18/2023  10:45:48 -------------------------------------------------------------------------------- Problem List Details Patient Name: Date of Service: [name], [name] [name] 05/18/2023 9:45 A M Medical Record Number: [id number] Patient Account Number: [id number] Date of Birth/Sex: Treating RN: May 25, 1966 (57 y.o. Yates Decamp Primary Care Provider: [name] Other Clinician: Referring Provider: Treating Provider/Extender: Theodis Sato in Treatment: 20 Active Problems ICD-10 Encounter Code Description Active Date MDM Diagnosis [phone] Non-pressure chronic ulcer of other part of left foot with other specified 12/25/2022 No Yes severity I70.245 Atherosclerosis of native arteries of left leg with ulceration of other part of 01/27/2023 No Yes foot 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 12/25/2022 No Yes Inactive Problems ICD-10 Code Description Active Date Inactive Date J44.9 Chronic obstructive pulmonary disease, unspecified 12/25/2022 12/25/2022 M06.9 Rheumatoid arthritis, unspecified 12/25/2022 12/25/2022 G29.528 Personal history of nicotine dependence 01/27/2023 01/27/2023 Resolved Problems Electronic Signature(s) Signed: 05/18/2023 4:41:28 PM By: [name] MD [name] ([id number]) [id number] Page 5 of 7 Entered By: [name] on 05/18/2023 10:59:18 -------------------------------------------------------------------------------- Progress Note Details Patient Name: Date of Service: [name], [name] 05/18/2023 9:45 A M Medical Record Number: [id number] Patient Account Number: [id number] Date of Birth/Sex: Treating RN: 08/31/1965 (57 y.o. M) Primary Care Provider: [name] Other Clinician: Referring Provider: Treating Provider/Extender: Theodis Sato in Treatment: 20 Subjective History of Present Illness (HPI) 12/25/2022 [name] is a 57 year old male with a  past medical history of controlled insulin-dependent type 1 diabetes, COPD and rheumatoid arthritis that presents the clinic for a 1 month history of nonhealing ulcer to the left foot. On 03/2020 patient had surgery for left fifth metatarsal fracture and subsequently had hardware removal in March 2022 due to infection. Over the past year he has experienced pain to the lateral left foot however the incision site and wound has healed. 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 He follows with Dr. [name] for this issue. Due to pain to the lateral left foot he uses Hoka sneakers however this rubbed at the back of his heel creating a wound to the Achilles region. He has since changed his shoe wear to boots that come up above this area or crocs. He has been able to offload this area over the past couple weeks. He is currently on doxycycline, pentoxifylline, nitroglycerin patch and cilostazol by Dr. [name] for his wound care. Currently patient denies signs of infection. ABI in office was 1.3 and he is scheduled for arterial duplex studies on 01/28/23. 5/30; patient presents for follow-up. He has been using Santyl to the wound bed. He had his ABIs completed that showed noncompressible on the left with TBI of 0.35. Currently denies signs of infection. 6/10; patient presents for follow-up. He has been using Santyl to the wound bed. He had been referred to vein and vascular at our last clinic visit however he cannot be seen until 6/25. He is concerned about the wound. We will call today to see if we can facilitate an earlier appointment. He denies signs of infection. He has a 40 pack smoking history and currently smokes about half a pack a day. 6/21; patient presents for follow-up.  follow hospital policy. *Juice or candies are NOT equivalent products. If patient refuses the Glucerna or Ensure, please consult the hospital dietitian for an appropriate substitute. Electronic Signature(s) Signed: 05/18/2023 4:41:28 PM By: [name] MD Signed: 05/20/2023 4:07:14 PM By: Brenton Grills Entered By: Brenton Grills on 05/18/2023  10:45:48 -------------------------------------------------------------------------------- Problem List Details Patient Name: Date of Service: [name], [name] [name] 05/18/2023 9:45 A M Medical Record Number: [id number] Patient Account Number: [id number] Date of Birth/Sex: Treating RN: May 25, 1966 (57 y.o. Yates Decamp Primary Care Provider: [name] Other Clinician: Referring Provider: Treating Provider/Extender: Theodis Sato in Treatment: 20 Active Problems ICD-10 Encounter Code Description Active Date MDM Diagnosis [phone] Non-pressure chronic ulcer of other part of left foot with other specified 12/25/2022 No Yes severity I70.245 Atherosclerosis of native arteries of left leg with ulceration of other part of 01/27/2023 No Yes foot 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 12/25/2022 No Yes Inactive Problems ICD-10 Code Description Active Date Inactive Date J44.9 Chronic obstructive pulmonary disease, unspecified 12/25/2022 12/25/2022 M06.9 Rheumatoid arthritis, unspecified 12/25/2022 12/25/2022 G29.528 Personal history of nicotine dependence 01/27/2023 01/27/2023 Resolved Problems Electronic Signature(s) Signed: 05/18/2023 4:41:28 PM By: [name] MD [name] ([id number]) [id number] Page 5 of 7 Entered By: [name] on 05/18/2023 10:59:18 -------------------------------------------------------------------------------- Progress Note Details Patient Name: Date of Service: [name], [name] 05/18/2023 9:45 A M Medical Record Number: [id number] Patient Account Number: [id number] Date of Birth/Sex: Treating RN: 08/31/1965 (57 y.o. M) Primary Care Provider: [name] Other Clinician: Referring Provider: Treating Provider/Extender: Theodis Sato in Treatment: 20 Subjective History of Present Illness (HPI) 12/25/2022 [name] is a 57 year old male with a  past medical history of controlled insulin-dependent type 1 diabetes, COPD and rheumatoid arthritis that presents the clinic for a 1 month history of nonhealing ulcer to the left foot. On 03/2020 patient had surgery for left fifth metatarsal fracture and subsequently had hardware removal in March 2022 due to infection. Over the past year he has experienced pain to the lateral left foot however the incision site and wound has healed. 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 He follows with Dr. [name] for this issue. Due to pain to the lateral left foot he uses Hoka sneakers however this rubbed at the back of his heel creating a wound to the Achilles region. He has since changed his shoe wear to boots that come up above this area or crocs. He has been able to offload this area over the past couple weeks. He is currently on doxycycline, pentoxifylline, nitroglycerin patch and cilostazol by Dr. [name] for his wound care. Currently patient denies signs of infection. ABI in office was 1.3 and he is scheduled for arterial duplex studies on 01/28/23. 5/30; patient presents for follow-up. He has been using Santyl to the wound bed. He had his ABIs completed that showed noncompressible on the left with TBI of 0.35. Currently denies signs of infection. 6/10; patient presents for follow-up. He has been using Santyl to the wound bed. He had been referred to vein and vascular at our last clinic visit however he cannot be seen until 6/25. He is concerned about the wound. We will call today to see if we can facilitate an earlier appointment. He denies signs of infection. He has a 40 pack smoking history and currently smokes about half a pack a day. 6/21; patient presents for follow-up.  follow hospital policy. *Juice or candies are NOT equivalent products. If patient refuses the Glucerna or Ensure, please consult the hospital dietitian for an appropriate substitute. Electronic Signature(s) Signed: 05/18/2023 4:41:28 PM By: [name] MD Signed: 05/20/2023 4:07:14 PM By: Brenton Grills Entered By: Brenton Grills on 05/18/2023  10:45:48 -------------------------------------------------------------------------------- Problem List Details Patient Name: Date of Service: [name], [name] [name] 05/18/2023 9:45 A M Medical Record Number: [id number] Patient Account Number: [id number] Date of Birth/Sex: Treating RN: May 25, 1966 (57 y.o. Yates Decamp Primary Care Provider: [name] Other Clinician: Referring Provider: Treating Provider/Extender: Theodis Sato in Treatment: 20 Active Problems ICD-10 Encounter Code Description Active Date MDM Diagnosis [phone] Non-pressure chronic ulcer of other part of left foot with other specified 12/25/2022 No Yes severity I70.245 Atherosclerosis of native arteries of left leg with ulceration of other part of 01/27/2023 No Yes foot 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 12/25/2022 No Yes Inactive Problems ICD-10 Code Description Active Date Inactive Date J44.9 Chronic obstructive pulmonary disease, unspecified 12/25/2022 12/25/2022 M06.9 Rheumatoid arthritis, unspecified 12/25/2022 12/25/2022 G29.528 Personal history of nicotine dependence 01/27/2023 01/27/2023 Resolved Problems Electronic Signature(s) Signed: 05/18/2023 4:41:28 PM By: [name] MD [name] ([id number]) [id number] Page 5 of 7 Entered By: [name] on 05/18/2023 10:59:18 -------------------------------------------------------------------------------- Progress Note Details Patient Name: Date of Service: [name], [name] 05/18/2023 9:45 A M Medical Record Number: [id number] Patient Account Number: [id number] Date of Birth/Sex: Treating RN: 08/31/1965 (57 y.o. M) Primary Care Provider: [name] Other Clinician: Referring Provider: Treating Provider/Extender: Theodis Sato in Treatment: 20 Subjective History of Present Illness (HPI) 12/25/2022 [name] is a 57 year old male with a  past medical history of controlled insulin-dependent type 1 diabetes, COPD and rheumatoid arthritis that presents the clinic for a 1 month history of nonhealing ulcer to the left foot. On 03/2020 patient had surgery for left fifth metatarsal fracture and subsequently had hardware removal in March 2022 due to infection. Over the past year he has experienced pain to the lateral left foot however the incision site and wound has healed. 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 He follows with Dr. [name] for this issue. Due to pain to the lateral left foot he uses Hoka sneakers however this rubbed at the back of his heel creating a wound to the Achilles region. He has since changed his shoe wear to boots that come up above this area or crocs. He has been able to offload this area over the past couple weeks. He is currently on doxycycline, pentoxifylline, nitroglycerin patch and cilostazol by Dr. [name] for his wound care. Currently patient denies signs of infection. ABI in office was 1.3 and he is scheduled for arterial duplex studies on 01/28/23. 5/30; patient presents for follow-up. He has been using Santyl to the wound bed. He had his ABIs completed that showed noncompressible on the left with TBI of 0.35. Currently denies signs of infection. 6/10; patient presents for follow-up. He has been using Santyl to the wound bed. He had been referred to vein and vascular at our last clinic visit however he cannot be seen until 6/25. He is concerned about the wound. We will call today to see if we can facilitate an earlier appointment. He denies signs of infection. He has a 40 pack smoking history and currently smokes about half a pack a day. 6/21; patient presents for follow-up.  Known significant PAD which was apparently not amenable to revascularization. Noteworthy his posterior tibial on the left is occluded this would No Doubt be the area that supplies the wound on the Achilles tendon. We undressed the wound for the first time in a long time. This does not look quite as good as the picture the patient has on his phone that I looked at last week. I think he is somewhat upset by this. They apparently applied Kerecis powder to this area last time but gave the permission to the patient shower with this I am not clear that he has really been  dressing it since then. 10/14; patient is nearing the end of the course of his HBO treatment. He has a wound on the posterior Achilles area on the left. He is also been followed by orthopedics Dr. Audrie Lia office. They have been applying weekly Kerecis powder and they did this last week. The patient is able to show me pictures and he has a clean granulated wound however raised edges around the circumference are also present. He is not complaining of any pain. He is having no problems in HBO Electronic Signature(s) Signed: 05/18/2023 4:41:28 PM By: [name] MD Entered By: [name] on 05/18/2023 11:02:15 -------------------------------------------------------------------------------- Physical Exam Details Patient Name: Date of Service: [name] Pont, [name] 05/18/2023 9:45 A M Medical Record Number: [id number] Patient Account Number: [id number] Date of Birth/Sex: Treating RN: 1966-04-03 (57 y.o. M) Primary Care Provider: [name] Other Clinician: Referring Provider: Treating Provider/Extender: Theodis Sato in Treatment: 20 Constitutional Sitting or standing Blood Pressure is within target range for patient.. Pulse regular and within target range for patient.Marland Kitchen Respirations regular, non-labored and within target range.. Temperature is normal and within the target range for the patient.Marland Kitchen Appears in no distress. Notes Wound exam; the wound was dressed we did not disturb the dressing. He will have Kerecis applied again I believe on Wednesday at orthopedics. Electronic Signature(s) Signed: 05/18/2023 4:41:28 PM By: [name] MD Entered By: [name] on 05/18/2023 11:02:53 -------------------------------------------------------------------------------- Physician Orders Details Patient Name: Date of Service: [name] Pont, [name] 05/18/2023 9:45 A M Medical Record Number: [id number] Patient Account Number: [id number] Date of Birth/Sex:  Treating RN: [birth year] (57 y.o. Yates Decamp Primary Care Provider: [name] Other Clinician: Referring Provider: Treating Provider/Extender: Theodis Sato in Treatment: 20 The following information was scribed by: Brenton Grills The information was scribed for: [name] Verbal / Phone Orders: No Diagnosis Coding Follow-up Appointments ppointment in 2 weeks. - Dr. Leanord Hawking Room 8 - please make appt. Return A Other: - Continue to follow up with Dr. [name] for wound care. ****applied only in wound center prisma with hydrogel until seen by Dr. [name] office Tuesday or Wednesday***** Cellular or Tissue Based Products Cellular or Tissue Based Product Type: - Dr. [name] covering wound care- Skin sub Haig Prophet) placed 04/22/23 placed at Dr. Audrie Lia office [place] [place] ([id number]) [id number] Page 3 of 7 Off-Loading Other: - ensure no pressure from shoes are rubbing the wound. Additional Orders / Instructions Stop/Decrease Smoking - Congratulations on stopping smoking!!! Follow Nutritious Diet - increase protein and closely monitor diabetes Other: - ALREADY DONE -Surgery with Dr [name] 03/04/23. Patient will follow with Dr [name] for wound care. Continue HBO. Juven Shake 1-2 times daily. 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 If appropriate for treatment, begin HBOT per protocol: 2.0 ATA for 90 Minutes  follow hospital policy. *Juice or candies are NOT equivalent products. If patient refuses the Glucerna or Ensure, please consult the hospital dietitian for an appropriate substitute. Electronic Signature(s) Signed: 05/18/2023 4:41:28 PM By: [name] MD Signed: 05/20/2023 4:07:14 PM By: Brenton Grills Entered By: Brenton Grills on 05/18/2023  10:45:48 -------------------------------------------------------------------------------- Problem List Details Patient Name: Date of Service: [name], [name] [name] 05/18/2023 9:45 A M Medical Record Number: [id number] Patient Account Number: [id number] Date of Birth/Sex: Treating RN: May 25, 1966 (57 y.o. Yates Decamp Primary Care Provider: [name] Other Clinician: Referring Provider: Treating Provider/Extender: Theodis Sato in Treatment: 20 Active Problems ICD-10 Encounter Code Description Active Date MDM Diagnosis [phone] Non-pressure chronic ulcer of other part of left foot with other specified 12/25/2022 No Yes severity I70.245 Atherosclerosis of native arteries of left leg with ulceration of other part of 01/27/2023 No Yes foot 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 12/25/2022 No Yes Inactive Problems ICD-10 Code Description Active Date Inactive Date J44.9 Chronic obstructive pulmonary disease, unspecified 12/25/2022 12/25/2022 M06.9 Rheumatoid arthritis, unspecified 12/25/2022 12/25/2022 G29.528 Personal history of nicotine dependence 01/27/2023 01/27/2023 Resolved Problems Electronic Signature(s) Signed: 05/18/2023 4:41:28 PM By: [name] MD [name] ([id number]) [id number] Page 5 of 7 Entered By: [name] on 05/18/2023 10:59:18 -------------------------------------------------------------------------------- Progress Note Details Patient Name: Date of Service: [name], [name] 05/18/2023 9:45 A M Medical Record Number: [id number] Patient Account Number: [id number] Date of Birth/Sex: Treating RN: 08/31/1965 (57 y.o. M) Primary Care Provider: [name] Other Clinician: Referring Provider: Treating Provider/Extender: Theodis Sato in Treatment: 20 Subjective History of Present Illness (HPI) 12/25/2022 [name] is a 57 year old male with a  past medical history of controlled insulin-dependent type 1 diabetes, COPD and rheumatoid arthritis that presents the clinic for a 1 month history of nonhealing ulcer to the left foot. On 03/2020 patient had surgery for left fifth metatarsal fracture and subsequently had hardware removal in March 2022 due to infection. Over the past year he has experienced pain to the lateral left foot however the incision site and wound has healed. 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 He follows with Dr. [name] for this issue. Due to pain to the lateral left foot he uses Hoka sneakers however this rubbed at the back of his heel creating a wound to the Achilles region. He has since changed his shoe wear to boots that come up above this area or crocs. He has been able to offload this area over the past couple weeks. He is currently on doxycycline, pentoxifylline, nitroglycerin patch and cilostazol by Dr. [name] for his wound care. Currently patient denies signs of infection. ABI in office was 1.3 and he is scheduled for arterial duplex studies on 01/28/23. 5/30; patient presents for follow-up. He has been using Santyl to the wound bed. He had his ABIs completed that showed noncompressible on the left with TBI of 0.35. Currently denies signs of infection. 6/10; patient presents for follow-up. He has been using Santyl to the wound bed. He had been referred to vein and vascular at our last clinic visit however he cannot be seen until 6/25. He is concerned about the wound. We will call today to see if we can facilitate an earlier appointment. He denies signs of infection. He has a 40 pack smoking history and currently smokes about half a pack a day. 6/21; patient presents for follow-up.  wound bed. There is a small (1-33%) amount of necrotic tissue within the wound bed including Adherent Slough. The periwound skin appearance did not exhibit: Callus, Crepitus, Excoriation, Induration, Rash, Scarring, Dry/Scaly, Maceration, Atrophie Blanche, Cyanosis, Ecchymosis, Hemosiderin Staining, Mottled, Pallor, Rubor, Erythema. 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 - Dr. Leanord Hawking Room 8 - please make appt. Other: - Continue to follow up with Dr. [name] for wound care. ****applied only in wound center prisma with hydrogel until seen by  Dr. [name] office Tuesday or Wednesday***** Cellular or Tissue Based Products: Cellular or Tissue Based Product Type: - Dr. [name] covering wound care- Skin sub Haig Prophet) placed 04/22/23 placed at Dr. Audrie Lia office Off-Loading: Other: - ensure no pressure from shoes are rubbing the wound. Additional Orders / Instructions: Stop/Decrease Smoking - Congratulations on stopping smoking!!! Follow Nutritious Diet - increase protein and closely monitor diabetes Other: - ALREADY DONE -Surgery with Dr [name] 03/04/23. Patient will follow with Dr [name] for wound care. Continue HBO. Juven Shake 1-2 times daily. Hyperbaric Oxygen Therapy: Wound #1 Left Achilles: Evaluate for HBO Therapy Indication: - arterial ulcer to left achilles wound. If appropriate for treatment, begin HBOT per protocol: 2.0 ATA for 90 Minutes with 2 Five (5) Minute Air Breaks T Number of Treatments: - 30. Extended to 60 (total).- Improved TCOMM, insurance approved extension of HBO. otal One treatments per day (delivered Monday through Friday unless otherwise specified in Special Instructions below): Finger stick Blood Glucose Pre- and Post- HBOT Treatment. Follow Hyperbaric Oxygen Glycemia Protocol Give two 4oz orange juices in addition to Glucerna when the glycemic protocol is used. Afrin (Oxymetazoline HCL) 0.05% nasal spray - 1 spray in both nostrils daily as needed prior to HBO treatment for difficulty clearing ears Other - TCOMM in chamber to be perform in one of the hyperbaric treatment on Tuesday 04/07/2023. If improved noted by [facility] will ask insurance for approval of extension of treatments. [employer], [place] ([id number]) [id number] Page 7 of 7 WOUND #1: - Achilles Wound Laterality: Left Prim Dressing: Promogran Prisma Matrix, 4.34 (sq in) (silver collagen) Other:Sees Dr. [name] for wound care/30 Days ary Discharge Instructions: Moisten collagen with hydrogel. apply only wound center. Secondary  Dressing: Woven Gauze Sponge, Non-Sterile 4x4 in Other:Sees Dr. [name] for wound care/30 Days Discharge Instructions: Apply over primary dressing as directed. Secured With: Elastic Bandage 4 inch (ACE bandage) Other:Sees Dr. [name] for wound care/30 Days Discharge Instructions: Secure with ACE bandage as directed. Secured With: [employer], 4.5x3.1 (in/yd) Other:Sees Dr. [name] for wound care/30 Days Discharge Instructions: Secure with Kerlix as directed. Secured With: 2M Medipore H Soft Cloth Surgical T ape, 4 x 10 (in/yd) Other:Sees Dr. [name] for wound care/30 Days Discharge Instructions: Secure with tape as directed. 1. We did not look at the wound today he is tolerating HBO well in fact he is coming to the end of his course of therapy. 2. Known PAD. 3. He has Kerecis powder applied every week at orthopedics. That will be this week on Wednesday Electronic Signature(s) Signed: 05/18/2023 4:41:28 PM By: [name] MD Entered By: [name] on 05/18/2023 11:03:57 -------------------------------------------------------------------------------- SuperBill Details Patient Name: Date of Service: [name] Pont, [name] 05/18/2023 Medical Record Number: [id number] Patient Account Number: [id number] Date of Birth/Sex: Treating RN: 1966-06-05 (57  Known significant PAD which was apparently not amenable to revascularization. Noteworthy his posterior tibial on the left is occluded this would No Doubt be the area that supplies the wound on the Achilles tendon. We undressed the wound for the first time in a long time. This does not look quite as good as the picture the patient has on his phone that I looked at last week. I think he is somewhat upset by this. They apparently applied Kerecis powder to this area last time but gave the permission to the patient shower with this I am not clear that he has really been  dressing it since then. 10/14; patient is nearing the end of the course of his HBO treatment. He has a wound on the posterior Achilles area on the left. He is also been followed by orthopedics Dr. Audrie Lia office. They have been applying weekly Kerecis powder and they did this last week. The patient is able to show me pictures and he has a clean granulated wound however raised edges around the circumference are also present. He is not complaining of any pain. He is having no problems in HBO Electronic Signature(s) Signed: 05/18/2023 4:41:28 PM By: [name] MD Entered By: [name] on 05/18/2023 11:02:15 -------------------------------------------------------------------------------- Physical Exam Details Patient Name: Date of Service: [name] Pont, [name] 05/18/2023 9:45 A M Medical Record Number: [id number] Patient Account Number: [id number] Date of Birth/Sex: Treating RN: 1966-04-03 (57 y.o. M) Primary Care Provider: [name] Other Clinician: Referring Provider: Treating Provider/Extender: Theodis Sato in Treatment: 20 Constitutional Sitting or standing Blood Pressure is within target range for patient.. Pulse regular and within target range for patient.Marland Kitchen Respirations regular, non-labored and within target range.. Temperature is normal and within the target range for the patient.Marland Kitchen Appears in no distress. Notes Wound exam; the wound was dressed we did not disturb the dressing. He will have Kerecis applied again I believe on Wednesday at orthopedics. Electronic Signature(s) Signed: 05/18/2023 4:41:28 PM By: [name] MD Entered By: [name] on 05/18/2023 11:02:53 -------------------------------------------------------------------------------- Physician Orders Details Patient Name: Date of Service: [name] Pont, [name] 05/18/2023 9:45 A M Medical Record Number: [id number] Patient Account Number: [id number] Date of Birth/Sex:  Treating RN: [birth year] (57 y.o. Yates Decamp Primary Care Provider: [name] Other Clinician: Referring Provider: Treating Provider/Extender: Theodis Sato in Treatment: 20 The following information was scribed by: Brenton Grills The information was scribed for: [name] Verbal / Phone Orders: No Diagnosis Coding Follow-up Appointments ppointment in 2 weeks. - Dr. Leanord Hawking Room 8 - please make appt. Return A Other: - Continue to follow up with Dr. [name] for wound care. ****applied only in wound center prisma with hydrogel until seen by Dr. [name] office Tuesday or Wednesday***** Cellular or Tissue Based Products Cellular or Tissue Based Product Type: - Dr. [name] covering wound care- Skin sub Haig Prophet) placed 04/22/23 placed at Dr. Audrie Lia office [place], [place] ([id number]) [id number] Page 3 of 7 Off-Loading Other: - ensure no pressure from shoes are rubbing the wound. Additional Orders / Instructions Stop/Decrease Smoking - Congratulations on stopping smoking!!! Follow Nutritious Diet - increase protein and closely monitor diabetes Other: - ALREADY DONE -Surgery with Dr [name] 03/04/23. Patient will follow with Dr [name] for wound care. Continue HBO. Juven Shake 1-2 times daily. 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 If appropriate for treatment, begin HBOT per protocol: 2.0 ATA for 90 Minutes  JULIA, KULZER ([id number]) [id number] Page 1 of 7 Visit Report for 05/18/2023 HPI Details Patient Name: Date of Service: Plover, [name] 05/18/2023 9:45 A M Medical Record Number: [id number] Patient Account Number: [id number] Date of Birth/Sex: Treating RN: November 11, 1965 (57 y.o. M) Primary Care Provider: [name] Other Clinician: Referring Provider: Treating Provider/Extender: Theodis Sato in Treatment: 20 History of Present Illness HPI Description: 12/25/2022 [name] is a 57 year old male with a past medical history of controlled insulin-dependent type 1 diabetes, COPD and rheumatoid arthritis that presents the clinic for a 1 month history of nonhealing ulcer to the left foot. On 03/2020 patient had surgery for left fifth metatarsal fracture and subsequently had hardware removal in March 2022 due to infection. Over the past year he has experienced pain to the lateral left foot however the incision site and wound has healed. 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 He follows with Dr. [name] for this issue. Due to pain to the lateral left foot he uses Hoka sneakers however this rubbed at the back of his heel creating a wound to the Achilles region. He has since changed his shoe wear to boots that come up above this area or crocs. He has been able to offload this area over the past couple weeks. He is currently on doxycycline, pentoxifylline, nitroglycerin patch and cilostazol by Dr. [name] for his wound care. Currently patient denies signs of infection. ABI in office was 1.3 and he is scheduled for arterial duplex studies on 01/28/23. 5/30; patient presents for follow-up. He has been using Santyl to the wound bed. He had his ABIs completed that showed noncompressible on the left with TBI of 0.35. Currently denies signs of infection. 6/10;  patient presents for follow-up. He has been using Santyl to the wound bed. He had been referred to vein and vascular at our last clinic visit however he cannot be seen until 6/25. He is concerned about the wound. We will call today to see if we can facilitate an earlier appointment. He denies signs of infection. He has a 40 pack smoking history and currently smokes about half a pack a day. 6/21; patient presents for follow-up. 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 He feels a plantar arch with very brisk flow into the foot. There is some disease in the distal anterior tibial that did not appear flow- limiting. No intervention necessary. It was thought that he had enough blood flow for healing. He continues to use Santyl. 6/25; patient presents for follow-up. He had TCOM exam last Friday with probes located above, below and each side of the wound bed. T the superior probe is o reading was 68 mmHg, post O2 176, the lateral probe reading was 60 mmHg, post O2 129, the medial probe reading was 70 mmHg, post o2 176, the inferior probe reading was 182 mmHg. His primary care physician also ordered arterial segmental studies that are currently completed tomorrow. He has been using Santyl to the wound bed. He does have more granulation tissue present today. 7/1; patient presents for follow-up. He has been approved for hyperbaric oxygen therapy by his insurance. He has been using Santyl to the wound bed. He has no issues or complaints today. 7/9; patient presents for follow-up. Patient has had cardiac clearance by his cardiologist to start  Known significant PAD which was apparently not amenable to revascularization. Noteworthy his posterior tibial on the left is occluded this would No Doubt be the area that supplies the wound on the Achilles tendon. We undressed the wound for the first time in a long time. This does not look quite as good as the picture the patient has on his phone that I looked at last week. I think he is somewhat upset by this. They apparently applied Kerecis powder to this area last time but gave the permission to the patient shower with this I am not clear that he has really been  dressing it since then. 10/14; patient is nearing the end of the course of his HBO treatment. He has a wound on the posterior Achilles area on the left. He is also been followed by orthopedics Dr. Audrie Lia office. They have been applying weekly Kerecis powder and they did this last week. The patient is able to show me pictures and he has a clean granulated wound however raised edges around the circumference are also present. He is not complaining of any pain. He is having no problems in HBO Electronic Signature(s) Signed: 05/18/2023 4:41:28 PM By: [name] MD Entered By: [name] on 05/18/2023 11:02:15 -------------------------------------------------------------------------------- Physical Exam Details Patient Name: Date of Service: [name] Pont, [name] 05/18/2023 9:45 A M Medical Record Number: [id number] Patient Account Number: [id number] Date of Birth/Sex: Treating RN: 1966-04-03 (57 y.o. M) Primary Care Provider: [name] Other Clinician: Referring Provider: Treating Provider/Extender: Theodis Sato in Treatment: 20 Constitutional Sitting or standing Blood Pressure is within target range for patient.. Pulse regular and within target range for patient.Marland Kitchen Respirations regular, non-labored and within target range.. Temperature is normal and within the target range for the patient.Marland Kitchen Appears in no distress. Notes Wound exam; the wound was dressed we did not disturb the dressing. He will have Kerecis applied again I believe on Wednesday at orthopedics. Electronic Signature(s) Signed: 05/18/2023 4:41:28 PM By: [name] MD Entered By: [name] on 05/18/2023 11:02:53 -------------------------------------------------------------------------------- Physician Orders Details Patient Name: Date of Service: [name] Pont, [name] 05/18/2023 9:45 A M Medical Record Number: [id number] Patient Account Number: [id number] Date of Birth/Sex:  Treating RN: [birth year] (57 y.o. Yates Decamp Primary Care Provider: [name] Other Clinician: Referring Provider: Treating Provider/Extender: Theodis Sato in Treatment: 20 The following information was scribed by: Brenton Grills The information was scribed for: [name] Verbal / Phone Orders: No Diagnosis Coding Follow-up Appointments ppointment in 2 weeks. - Dr. Leanord Hawking Room 8 - please make appt. Return A Other: - Continue to follow up with Dr. [name] for wound care. ****applied only in wound center prisma with hydrogel until seen by Dr. [name] office Tuesday or Wednesday***** Cellular or Tissue Based Products Cellular or Tissue Based Product Type: - Dr. [name] covering wound care- Skin sub Haig Prophet) placed 04/22/23 placed at Dr. Audrie Lia office [place], [place] ([id number]) [id number] Page 3 of 7 Off-Loading Other: - ensure no pressure from shoes are rubbing the wound. Additional Orders / Instructions Stop/Decrease Smoking - Congratulations on stopping smoking!!! Follow Nutritious Diet - increase protein and closely monitor diabetes Other: - ALREADY DONE -Surgery with Dr [name] 03/04/23. Patient will follow with Dr [name] for wound care. Continue HBO. Juven Shake 1-2 times daily. 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 If appropriate for treatment, begin HBOT per protocol: 2.0 ATA for 90 Minutes

## 2023-05-21 ENCOUNTER — Ambulatory Visit: Admission: RE | Admit: 2023-05-21 | Payer: 59 | Source: Ambulatory Visit

## 2023-05-21 ENCOUNTER — Encounter (HOSPITAL_BASED_OUTPATIENT_CLINIC_OR_DEPARTMENT_OTHER): Payer: 59 | Admitting: Internal Medicine

## 2023-05-21 DIAGNOSIS — E10621 Type 1 diabetes mellitus with foot ulcer: Secondary | ICD-10-CM | POA: Diagnosis not present

## 2023-05-21 LAB — GLUCOSE, CAPILLARY
Glucose-Capillary: 210 mg/dL — ABNORMAL HIGH (ref 70–99)
Glucose-Capillary: 162 mg/dL — ABNORMAL HIGH (ref 70–99)

## 2023-05-21 NOTE — Progress Notes (Addendum)
[name], [name] ([id number]) [id number] Page 1 of 2 Visit Report for 05/21/2023 HBO Details Patient Name: Date of Service: [place], [name] 05/21/2023 7:30 A M Medical Record Number: [id number] Patient Account Number: [id number] Date of Birth/Sex: Treating RN: 06/26/1966 (57 y.o. [name] Primary Care [name]: [name] Other Clinician: Haywood [name] Referring [name]: Treating [name]/Extender: [name] in Treatment: 21 HBO Treatment Course Details Treatment Course Number: 1 Ordering [name]: [name] T Treatments Ordered: otal 60 HBO Treatment Start Date: 02/16/2023 HBO Indication: Other (specify in Notes) Notes: Acute peripheral arterial insufficiency HBO Treatment Details Treatment Number: 54 Patient Type: Outpatient Chamber Type: Monoplace Chamber Serial #: [id number] Treatment Protocol: 2.0 ATA with 90 minutes oxygen, with two 5 minute air breaks Treatment Details Compression Rate Down: 1.5 psi / minute De-Compression Rate Up: 2.0 psi / minute A breaks and breathing ir Compress Tx Pressure periods Decompress Decompress Begins Reached (leave unused spaces Begins Ends blank) Chamber Pressure (ATA 1 2 2 2 2 2  --2 1 ) Clock Time (24 hr) 08:15 08:29 08:59 09:04 09:34 09:38 - - 10:08 10:18 Treatment Length: 123 (minutes) Treatment Segments: 4 Vital Signs Capillary Blood Glucose Reference Range: 80 - 120 mg / dl HBO Diabetic Blood Glucose Intervention Range: <131 mg/dl or >616 mg/dl Type: Time Vitals Blood Respiratory Capillary Blood Glucose Pulse Action Pulse: Temperature: Taken: Pressure: Rate: Glucose (mg/dl): Meter #: Oximetry (%) Taken: Pre 08:03 116/53 72 18 97.6 210 none per protocol Post 10:24 121/57 67 18 98 162 none per protocol Treatment Response Treatment Toleration: Well Treatment Completion Status: Treatment Completed without Adverse Event Treatment Notes [name] arrived with normal vital signs except diastolic BP of 53 mmHg. He denied symptoms of hypotension. He prepared for treatment. After performing a safety check, he was placed in the chamber which was compressed at a rate of 2 psi/min after confirming normal ear equalization. He tolerated the treatment and subsequent decompression at a rate of 2 psi/min. His post treatment vitals were normal with the exception of diastolic BP at 57 mmHg. He denied any symptoms of hypotension. He was stable upon discharge. [name] Notes No concerns with treatment given Physician HBO Attestation: I certify that I supervised this HBO treatment in accordance with Medicare guidelines. A trained emergency response team is readily available per Yes hospital policies and procedures. Continue HBOT as ordered. Yes Electronic Signature(s) Signed: 05/25/2023 6:11:24 PM By: [name] Najjar MD Previous Signature: 05/21/2023 11:34:03 AM Version By: [name] CHT EMT BS , , Entered By: [name] on 05/21/2023 16:02:19 [name] ([id number]) [id number] Page 2 of 2 -------------------------------------------------------------------------------- HBO Safety Checklist Details Patient Name: Date of Service: [name], [name] 05/21/2023 7:30 A M Medical Record Number: [id number] Patient Account Number: [id number] Date of Birth/Sex: Treating RN: March 13, 1966 (57 y.o. [name] Primary Care Emonnie Cannady: [name] Other Clinician: Haywood [name] Referring Shelvy Perazzo: Treating Anneta Rounds/Extender: [name] in Treatment: 21 HBO Safety Checklist Items Safety Checklist Consent Form Signed Patient voided / foley secured and emptied When did you last eato 0700 - Blueberry Waffle, Protein Shake Last dose of injectable or oral agent 0630 - 17 units Lantus Ostomy pouch emptied and vented if applicable NA All implantable devices assessed,  documented and approved Freestyle Libre 2 and3 on approved list Intravenous access site secured and place NA Valuables secured Linens and cotton and cotton/polyester blend (less than 51% polyester) Personal oil-based products / skin lotions / body lotions removed Wigs or  hairpieces removed NA Smoking or tobacco materials removed NA Books / newspapers / magazines / loose paper removed Cologne, aftershave, perfume and deodorant removed Jewelry removed (may wrap wedding band) Make-up removed NA Hair care products removed Battery operated devices (external) removed Heating patches and chemical warmers removed Titanium eyewear removed Nail polish cured greater than 10 hours NA Casting material cured greater than 10 hours NA Hearing aids removed NA Loose dentures or partials removed NA Prosthetics have been removed NA Patient demonstrates correct use of air break device (if applicable) Patient concerns have been addressed Patient grounding bracelet on and cord attached to chamber Specifics for Inpatients (complete in addition to above) Medication sheet sent with patient NA Intravenous medications needed or due during therapy sent with patient NA Drainage tubes (e.g. nasogastric tube or chest tube secured and vented) NA Endotracheal or Tracheotomy tube secured NA Cuff deflated of air and inflated with saline NA Airway suctioned NA Notes Paper version used prior to treatment start. Electronic Signature(s) Signed: 05/21/2023 11:21:46 AM By: [name] CHT EMT BS , , Entered By: [name] on 05/21/2023 11:21:45

## 2023-05-21 NOTE — Progress Notes (Addendum)
KOLBE, DELMONACO ([id number]) [id number] Page 1 of 2 Visit Report for 05/21/2023 Arrival Information Details Patient Name: Date of Service: [name] 05/21/2023 7:30 A M Medical Record Number: [id number] Patient Account Number: [id number] Date of Birth/Sex: Treating RN: 1965/10/07 (57 y.o. [name] Primary Care Kaneisha Ellenberger: [name] Other Clinician: [name] Referring Wetzel Meester: Treating Jef Futch/Extender: Theodis Sato in Treatment: 21 Visit Information History Since Last Visit All ordered tests and consults were completed: Yes Patient Arrived: Ambulatory Added or deleted any medications: No Arrival Time: 07:55 Any new allergies or adverse reactions: No Accompanied By: self Had a fall or experienced change in No Transfer Assistance: None activities of daily living that may affect Patient Identification Verified: Yes risk of falls: Secondary Verification Process Completed: Yes Signs or symptoms of abuse/neglect since last visito No Patient Requires Transmission-Based Precautions: No Hospitalized since last visit: No Patient Has Alerts: No Implantable device outside of the clinic excluding No cellular tissue based products placed in the center since last visit: Pain Present Now: No Electronic Signature(s) Signed: 05/21/2023 11:19:09 AM By: [name] CHT EMT BS , , Entered By: [name] on 05/21/2023 11:19:09 -------------------------------------------------------------------------------- Encounter Discharge Information Details Patient Name: Date of Service: [name] Pont, [name] 05/21/2023 7:30 A M Medical Record Number: [id number] Patient Account Number: [id number] Date of Birth/Sex: Treating RN: Nov 02, 1965 (57 y.o. [name] Primary Care Kharter Brew: [name] Other Clinician: [name] Referring Armarion Greek: Treating Smt Lokey/Extender: Theodis Sato in Treatment: 21 Encounter Discharge Information Items Discharge Condition: Stable Ambulatory Status: Ambulatory Discharge Destination: Other (Note Required) Transportation: Private Auto Accompanied By: self Schedule Follow-up Appointment: No Clinical Summary of Care: Notes Patient goes to work after treatment. Electronic Signature(s) Signed: 05/21/2023 1:01:58 PM By: [name] CHT EMT BS , , Entered By: [name] on 05/21/2023 13:01:58 [name] ([id number]) [id number] Page 2 of 2 -------------------------------------------------------------------------------- Vitals Details Patient Name: Date of Service: [name] 05/21/2023 7:30 A M Medical Record Number: [id number] Patient Account Number: [id number] Date of Birth/Sex: Treating RN: [birth year] (57 y.o. [name] Primary Care Ranvir Renovato: [name] Other Clinician: [name] Referring Georgana Romain: Treating [place] Voytko/Extender: Theodis Sato in Treatment: 21 Vital Signs Time Taken: 08:03 Temperature (F): 97.6 Height (in): 68 Pulse (bpm): 72 Weight (lbs): 200 Respiratory Rate (breaths/min): 18 Body Mass Index (BMI): 30.4 Blood Pressure (mmHg): 116/53 Capillary Blood Glucose (mg/dl): 416 Reference Range: 80 - 120 mg / dl Electronic Signature(s) Signed: 05/21/2023 11:19:41 AM By: [name] CHT EMT BS , , Entered By: [name] on 05/21/2023 11:19:41

## 2023-05-22 ENCOUNTER — Encounter (HOSPITAL_BASED_OUTPATIENT_CLINIC_OR_DEPARTMENT_OTHER): Payer: 59 | Admitting: General Surgery

## 2023-05-22 DIAGNOSIS — E10621 Type 1 diabetes mellitus with foot ulcer: Secondary | ICD-10-CM | POA: Diagnosis not present

## 2023-05-22 LAB — GLUCOSE, CAPILLARY
Glucose-Capillary: 172 mg/dL — ABNORMAL HIGH (ref 70–99)
Glucose-Capillary: 208 mg/dL — ABNORMAL HIGH (ref 70–99)

## 2023-05-22 NOTE — Progress Notes (Signed)
NYZIER, REMMICK ([id number]) [id number] Page 1 of 1 Visit Report for 05/22/2023 SuperBill Details Patient Name: Date of Service: [name], [name] 05/22/2023 Medical Record Number: [id number] Patient Account Number: [id number] Date of Birth/Sex: Treating RN: 09/17/1965 (57 y.o. [name] Primary Care Provider: [name] Other Clinician: [name] Referring Provider: Treating Provider/Extender: Marena Chancy in Treatment: 21 Diagnosis Coding ICD-10 Codes Code Description [phone] Non-pressure chronic ulcer of other part of left foot with other specified severity I70.245 Atherosclerosis of native arteries of left leg with ulceration of other part of foot 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 Facility Procedures CPT4 Code Description Modifier Quantity [id number] G0277-(Facility Use Only) HBOT full body chamber, , 4 ICD-10 Diagnosis Description 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 Physician Procedures Quantity CPT4 Code Description Modifier [id number] - WC PHYS HYPERBARIC OXYGEN THERAPY 1 ICD-10 Diagnosis Description 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 Electronic Signature(s) Signed: 05/22/2023 10:56:17 AM By: [name] CHT EMT BS , , Signed: 05/22/2023 12:31:34 PM By: Duanne Guess MD FACS Entered By: [name] on 05/22/2023 07:56:16

## 2023-05-22 NOTE — Progress Notes (Addendum)
JAYDIS, WILLIAM ([id number]) [id number] Page 1 of 2 Visit Report for 05/22/2023 Arrival Information Details Patient Name: Date of Service: [name], [name] 05/22/2023 7:30 A M Medical Record Number: [id number] Patient Account Number: [id number] Date of Birth/Sex: Treating RN: [birth year] (57 y.o. [name], [name] Primary Care [name]: [name] Other Clinician: Karl [name] Referring [name]: Treating [name]/Extender: [name] in Treatment: 21 Visit Information History Since Last Visit All ordered tests and consults were completed: Yes Patient Arrived: Ambulatory Added or deleted any medications: No Arrival Time: 07:45 Any new allergies or adverse reactions: No Accompanied By: self Had a fall or experienced change in No Transfer Assistance: None activities of daily living that may affect Patient Identification Verified: Yes risk of falls: Secondary Verification Process Completed: Yes Signs or symptoms of abuse/neglect since last visito No Patient Requires Transmission-Based Precautions: No Hospitalized since last visit: No Patient Has Alerts: No Implantable device outside of the clinic excluding No cellular tissue based products placed in the center since last visit: Pain Present Now: No Electronic Signature(s) Signed: 05/22/2023 8:41:58 AM By: [name] , , Entered By: [name] on 05/22/2023 05:41:57 -------------------------------------------------------------------------------- Encounter Discharge Information Details Patient Name: Date of Service: [name], [name] [name] J. 05/22/2023 7:30 A M Medical Record Number: [id number] Patient Account Number: [id number] Date of Birth/Sex: Treating RN: [birth year] (57 y.o. [name] Primary Care [name]: [name] Other Clinician: Haywood [name] Referring [name]: Treating [name]/Extender: [name] in Treatment: 21 Encounter Discharge Information Items Discharge Condition: Stable Ambulatory Status: Ambulatory Discharge Destination: Other (Note Required) Transportation: Private Auto Accompanied By: self Schedule Follow-up Appointment: No Clinical Summary of Care: Notes Patient goes to work after treatment. Electronic Signature(s) Signed: 05/22/2023 10:56:46 AM By: [name] , , Entered By: [name] on 05/22/2023 07:56:46 [name], [name] Garter ([id number]) [id number] Page 2 of 2 -------------------------------------------------------------------------------- Vitals Details Patient Name: Date of Service: [name], [name] 05/22/2023 7:30 A M Medical Record Number: [id number] Patient Account Number: [id number] Date of Birth/Sex: Treating RN: 1966/02/10 (57 y.o. [name] Primary Care [name]: [name] Other Clinician: Karl [name] Referring Alianys Chacko: Treating [name]/Extender: [name] in Treatment: 21 Vital Signs Time Taken: 07:45 Temperature (F): 98.1 Height (in): 68 Pulse (bpm): 71 Weight (lbs): 200 Respiratory Rate (breaths/min): 18 Body Mass Index (BMI): 30.4 Blood Pressure (mmHg): 136/52 Capillary Blood Glucose (mg/dl): 433 Reference Range: 80 - 120 mg / dl Electronic Signature(s) Signed: 05/22/2023 8:42:31 AM By: [name] , , Entered By: [name] on 05/22/2023 05:42:31

## 2023-05-22 NOTE — Progress Notes (Addendum)
DALEON, BUZBY ([id number]) [id number] Page 1 of 2 Visit Report for 05/22/2023 HBO Details Patient Name: Date of Service: [place], [name] 05/22/2023 7:30 A M Medical Record Number: [id number] Patient Account Number: [id number] Date of Birth/Sex: Treating RN: 11-29-1965 (57 y.o. [name] Primary Care Kariann Wecker: [name] Other Clinician: [name] Referring Tyren Dugar: Treating Timothee Gali/Extender: Marena Chancy in Treatment: 21 HBO Treatment Course Details Treatment Course Number: 1 Ordering Keyani Rigdon: Geralyn Corwin T Treatments Ordered: otal 60 HBO Treatment Start Date: 02/16/2023 HBO Indication: Other (specify in Notes) Notes: Acute peripheral arterial insufficiency HBO Treatment Details Treatment Number: 55 Patient Type: Outpatient Chamber Type: Monoplace Chamber Serial #: [id number] Treatment Protocol: 2.0 ATA with 90 minutes oxygen, with two 5 minute air breaks Treatment Details Compression Rate Down: 1.5 psi / minute De-Compression Rate Up: 2.0 psi / minute A breaks and breathing ir Compress Tx Pressure periods Decompress Decompress Begins Reached (leave unused spaces Begins Ends blank) Chamber Pressure (ATA 1 2 2 2 2 2  --2 1 ) Clock Time (24 hr) 08:04 08:17 08:48 08:53 09:23 09:28 - - 09:57 10:08 Treatment Length: 124 (minutes) Treatment Segments: 4 Vital Signs Capillary Blood Glucose Reference Range: 80 - 120 mg / dl HBO Diabetic Blood Glucose Intervention Range: <131 mg/dl or >284 mg/dl Type: Time Vitals Blood Respiratory Capillary Blood Glucose Pulse Action Pulse: Temperature: Taken: Pressure: Rate: Glucose (mg/dl): Meter #: Oximetry (%) Taken: Pre 07:45 136/52 71 18 98.1 172 none per protocol Post 10:11 130/68 68 18 97.9 208 none per protocol Treatment Response Treatment Toleration: Well Treatment Completion Status: Treatment Completed without Adverse Event Treatment Notes Mr.  Kinner arrived with normal vital signs except diastolic BP of 52 mmHg. He denied symptoms of hypotension. He prepared for treatment. After performing a safety check, he was placed in the chamber which was compressed at a rate of 2 psi/min after confirming normal ear equalization. He tolerated the treatment and subsequent decompression at a rate of 2 psi/min. His post treatment vitals were normal. He was stable upon discharge. Physician HBO Attestation: I certify that I supervised this HBO treatment in accordance with Medicare guidelines. A trained emergency response team is readily available per Yes hospital policies and procedures. Continue HBOT as ordered. Yes Electronic Signature(s) Signed: 05/22/2023 12:32:26 PM By: Duanne Guess MD FACS Previous Signature: 05/22/2023 10:55:52 AM Version By: [name] CHT EMT BS , , Entered By: Duanne Guess on 05/22/2023 09:32:26 [name] ([id number]) [id number] Page 2 of 2 -------------------------------------------------------------------------------- HBO Safety Checklist Details Patient Name: Date of Service: [place], [name] 05/22/2023 7:30 A M Medical Record Number: [id number] Patient Account Number: [id number] Date of Birth/Sex: Treating RN: 04/19/1966 (57 y.o. [name] Primary Care Faatimah Spielberg: [name] Other Clinician: [name] Referring Ariany Kesselman: Treating Trygve Thal/Extender: Marena Chancy in Treatment: 21 HBO Safety Checklist Items Safety Checklist Consent Form Signed Patient voided / foley secured and emptied When did you last eato 0700 - Protein Shake, 2 waffle Last dose of injectable or oral agent 0630 17 units Lantus Ostomy pouch emptied and vented if applicable NA All implantable devices assessed, documented and approved NA Intravenous access site secured and place NA Valuables secured Linens and cotton and cotton/polyester blend  (less than 51% polyester) Personal oil-based products / skin lotions / body lotions removed Wigs or hairpieces removed NA Smoking or tobacco materials removed NA Books / newspapers / magazines / loose paper removed Cologne, aftershave, perfume and deodorant removed Jewelry removed (may wrap  wedding band) Make-up removed Hair care products removed Battery operated devices (external) removed Heating patches and chemical warmers removed Titanium eyewear removed Nail polish cured greater than 10 hours Casting material cured greater than 10 hours Hearing aids removed NA Loose dentures or partials removed NA Prosthetics have been removed NA Patient demonstrates correct use of air break device (if applicable) Patient concerns have been addressed Patient grounding bracelet on and cord attached to chamber Specifics for Inpatients (complete in addition to above) Medication sheet sent with patient NA Intravenous medications needed or due during therapy sent with patient NA Drainage tubes (e.g. nasogastric tube or chest tube secured and vented) NA Endotracheal or Tracheotomy tube secured NA Cuff deflated of air and inflated with saline NA Airway suctioned NA Notes Paper version used prior to treatment start. Electronic Signature(s) Signed: 05/22/2023 8:55:46 AM By: [name] CHT EMT BS , , Entered By: [name] on 05/22/2023 05:55:46

## 2023-05-25 ENCOUNTER — Encounter (HOSPITAL_BASED_OUTPATIENT_CLINIC_OR_DEPARTMENT_OTHER): Payer: 59 | Admitting: Internal Medicine

## 2023-05-25 DIAGNOSIS — E10621 Type 1 diabetes mellitus with foot ulcer: Secondary | ICD-10-CM | POA: Diagnosis not present

## 2023-05-25 LAB — GLUCOSE, CAPILLARY
Glucose-Capillary: 165 mg/dL — ABNORMAL HIGH (ref 70–99)
Glucose-Capillary: 224 mg/dL — ABNORMAL HIGH (ref 70–99)

## 2023-05-25 NOTE — Progress Notes (Signed)
IDHANT, LEHRMAN ([id number]) [id number] Page 1 of 2 Visit Report for 05/25/2023 HBO Details Patient Name: Date of Service: [place], [name] 05/25/2023 7:30 A M Medical Record Number: [id number] Patient Account Number: [id number] Date of Birth/Sex: Treating RN: Jul 12, 1966 (57 y.o. [name] Primary Care Hannibal Skalla: [name] Other Clinician: [name] Referring Antwone Capozzoli: Treating Arrielle Mcginn/Extender: Theodis Sato in Treatment: 21 HBO Treatment Course Details Treatment Course Number: 1 Ordering Janesa Dockery: Geralyn Corwin T Treatments Ordered: otal 60 HBO Treatment Start Date: 02/16/2023 HBO Indication: Other (specify in Notes) Notes: Acute peripheral arterial insufficiency HBO Treatment Details Treatment Number: 56 Patient Type: Outpatient Chamber Type: Monoplace Chamber Serial #: [id number] Treatment Protocol: 2.0 ATA with 90 minutes oxygen, with two 5 minute air breaks Treatment Details Compression Rate Down: 1.5 psi / minute De-Compression Rate Up: 3.0 psi / minute A breaks and ir breathing Compress Tx Pressure Decompress Decompress periods Begins Reached Begins Ends (leave unused spaces blank) Chamber Pressure (ATA 1 2 2 2  22--2 1 ) Clock Time (24 hr) 08:04 08:16 08:46 08:51 - - - - 09:07 09:12 Treatment Length: 68 (minutes) Treatment Segments: 2 Vital Signs Capillary Blood Glucose Reference Range: 80 - 120 mg / dl HBO Diabetic Blood Glucose Intervention Range: <131 mg/dl or >295 mg/dl Type: Time Vitals Blood Respiratory Capillary Blood Glucose Pulse Action Pulse: Temperature: Taken: Pressure: Rate: Glucose (mg/dl): Meter #: Oximetry (%) Taken: Pre 07:47 134/63 71 18 98.6 165 none per protocol Post 09:37 142/68 60 18 98.6 224 none per protocol Treatment Response Treatment Toleration: Well Treatment Completion Status: Treatment Aborted/Not Restarted Reason: [name] Choice Treatment  Notes [name] arrived with normal vital signs. He prepared for treatment. After performing a safety check, he was placed in the chamber which was compressed at a rate of 2 psi/min after confirming normal ear equalization. He tolerated the treatment until treatment was cut short by a fire alarm and evacuation. Chamber decompressed at a rate of approximately 3 psi/min. Patient evacuated and upon return to hyperbaric suite, his post-treatment vitals were normal. He was stable upon discharge. [name] Notes No concerns with treatment given. Treatment had to be aborted early because of a fire alarm in the building Physician HBO Attestation: I certify that I supervised this HBO treatment in accordance with Medicare guidelines. A trained emergency response team is readily available per Yes hospital policies and procedures. Continue HBOT as ordered. Yes Electronic Signature(s) Signed: 05/25/2023 6:11:24 PM By: [name] MD Previous Signature: 05/25/2023 3:06:32 PM Version By: [name] CHT EMT BS , , Previous Signature: 05/25/2023 11:57:32 AM Version By: [name] CHT EMT BS , , [name], [name] ([id number]) [id number] Page 2 of 2 Previous Signature: 05/25/2023 11:57:32 AM Version By: [name] CHT EMT BS , , Entered By: [name] on 05/25/2023 16:02:33 -------------------------------------------------------------------------------- HBO Safety Checklist Details Patient Name: Date of Service: [place], [name] 05/25/2023 7:30 A M Medical Record Number: [id number] Patient Account Number: [id number] Date of Birth/Sex: Treating RN: Jul 26, 1966 (57 y.o. [name] Primary Care Yoselyn Mcglade: [name] Other Clinician: [name] Referring Gilverto Dileonardo: Treating Gerrell Tabet/Extender: Theodis Sato in Treatment: 21 HBO Safety Checklist Items Safety Checklist Consent Form Signed Patient voided /  foley secured and emptied 0700 - 2 waffle, protein shake, small amount When did you last eato of coffee Last dose of injectable or oral agent 0630 - 17 unit Lantus Ostomy pouch emptied and vented if applicable NA All implantable devices assessed, documented and approved NA  Intravenous access site secured and place NA Valuables secured Linens and cotton and cotton/polyester blend (less than 51% polyester) Personal oil-based products / skin lotions / body lotions removed Wigs or hairpieces removed NA Smoking or tobacco materials removed NA Books / newspapers / magazines / loose paper removed Cologne, aftershave, perfume and deodorant removed Jewelry removed (may wrap wedding band) Make-up removed Hair care products removed Battery operated devices (external) removed Heating patches and chemical warmers removed Titanium eyewear removed Nail polish cured greater than 10 hours NA Casting material cured greater than 10 hours NA Hearing aids removed NA Loose dentures or partials removed NA Prosthetics have been removed NA Patient demonstrates correct use of air break device (if applicable) Patient concerns have been addressed Patient grounding bracelet on and cord attached to chamber Specifics for Inpatients (complete in addition to above) Medication sheet sent with patient NA Intravenous medications needed or due during therapy sent with patient NA Drainage tubes (e.g. nasogastric tube or chest tube secured and vented) NA Endotracheal or Tracheotomy tube secured NA Cuff deflated of air and inflated with saline NA Airway suctioned NA Notes Paper version used prior to treatment start. Electronic Signature(s) Signed: 05/25/2023 11:48:09 AM By: [name] CHT EMT BS , , Entered By: [name] on 05/25/2023 11:48:08

## 2023-05-25 NOTE — Progress Notes (Addendum)
BOWEN, MIJARES ([id number]) [id number] Page 1 of 2 Visit Report for 05/25/2023 Arrival Information Details Patient Name: Date of Service: [place], [name] 05/25/2023 7:30 A M Medical Record Number: [id number] Patient Account Number: [id number] Date of Birth/Sex: Treating RN: 08-04-1966 (57 y.o. Bayard Hugger, Bonita Quin Primary Care Isabeau Mccalla: [name] Other Clinician: [name] Referring Tannon Peerson: Treating Arieh Bogue/Extender: Theodis Sato in Treatment: 21 Visit Information History Since Last Visit All ordered tests and consults were completed: Yes Patient Arrived: Ambulatory Added or deleted any medications: No Arrival Time: 07:43 Any new allergies or adverse reactions: No Accompanied By: self Had a fall or experienced change in No Transfer Assistance: None activities of daily living that may affect Patient Identification Verified: Yes risk of falls: Secondary Verification Process Completed: Yes Signs or symptoms of abuse/neglect since last visito No Patient Requires Transmission-Based Precautions: No Hospitalized since last visit: No Patient Has Alerts: No Implantable device outside of the clinic excluding No cellular tissue based products placed in the center since last visit: Pain Present Now: No Electronic Signature(s) Signed: 05/25/2023 11:38:08 AM By: [name] CHT EMT BS , , Entered By: [name] on 05/25/2023 08:38:08 -------------------------------------------------------------------------------- Encounter Discharge Information Details Patient Name: Date of Service: [name], [name] 05/25/2023 7:30 A M Medical Record Number: [id number] Patient Account Number: [id number] Date of Birth/Sex: Treating RN: 1966-06-30 (57 y.o. [name] Primary Care Kanylah Muench: [name] Other Clinician: [name] Referring Kataleena Holsapple: Treating Ryu Cerreta/Extender: Theodis Sato in Treatment: 21 Encounter Discharge Information Items Discharge Condition: Stable Ambulatory Status: Ambulatory Discharge Destination: Home Transportation: Private Auto Accompanied By: self Schedule Follow-up Appointment: No Clinical Summary of Care: Electronic Signature(s) Signed: 05/25/2023 3:09:15 PM By: [name] CHT EMT BS , , Entered By: [name] on 05/25/2023 12:09:15 [name] ([id number]) [id number] Page 2 of 2 -------------------------------------------------------------------------------- Vitals Details Patient Name: Date of Service: [place], [name] 05/25/2023 7:30 A M Medical Record Number: [id number] Patient Account Number: [id number] Date of Birth/Sex: Treating RN: 13-Dec-1965 (57 y.o. [name] Primary Care Jamieka Royle: [name] Other Clinician: [name] Referring Alee Gressman: Treating Alannie Amodio/Extender: Theodis Sato in Treatment: 21 Vital Signs Time Taken: 07:47 Temperature (F): 98.6 Height (in): 68 Pulse (bpm): 71 Weight (lbs): 200 Respiratory Rate (breaths/min): 18 Body Mass Index (BMI): 30.4 Blood Pressure (mmHg): 134/63 Capillary Blood Glucose (mg/dl): 202 Reference Range: 80 - 120 mg / dl Electronic Signature(s) Signed: 05/25/2023 11:39:40 AM By: [name] CHT EMT BS , , Entered By: [name] on 05/25/2023 08:39:40

## 2023-05-26 ENCOUNTER — Encounter (HOSPITAL_BASED_OUTPATIENT_CLINIC_OR_DEPARTMENT_OTHER): Payer: 59 | Admitting: Internal Medicine

## 2023-05-26 NOTE — Progress Notes (Signed)
TEMESGEN, JUEDES ([id number]) [id number] Page 1 of 1 Visit Report for 05/25/2023 SuperBill Details Patient Name: Date of Service: [name], [name] 05/25/2023 Medical Record Number: [id number] Patient Account Number: [id number] Date of Birth/Sex: Treating RN: Nov 22, 1965 (57 y.o. 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 Physician Procedures Quantity CPT4 Code Description Modifier [id number] 99183 - WC PHYS HYPERBARIC OXYGEN THERAPY 1 ICD-10 Diagnosis Description 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

## 2023-05-26 NOTE — Progress Notes (Signed)
UTSAV, KAEHLER ([id number]) [id number] Page 1 of 1 Visit Report for 05/21/2023 SuperBill Details Patient Name: Date of Service: [name], [name] 05/21/2023 Medical Record Number: [id number] Patient Account Number: [id number] Date of Birth/Sex: Treating RN: Dec 23, 1965 (57 y.o. 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 Physician Procedures Quantity CPT4 Code Description Modifier [id number] 99183 - WC PHYS HYPERBARIC OXYGEN THERAPY 1 ICD-10 Diagnosis Description 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

## 2023-05-27 ENCOUNTER — Encounter (HOSPITAL_BASED_OUTPATIENT_CLINIC_OR_DEPARTMENT_OTHER): Payer: 59 | Admitting: Physician Assistant

## 2023-05-27 ENCOUNTER — Ambulatory Visit: Payer: 59 | Admitting: Family

## 2023-05-27 ENCOUNTER — Encounter: Payer: Self-pay | Admitting: Family

## 2023-05-27 DIAGNOSIS — E10621 Type 1 diabetes mellitus with foot ulcer: Secondary | ICD-10-CM | POA: Diagnosis not present

## 2023-05-27 DIAGNOSIS — L97423 Non-pressure chronic ulcer of left heel and midfoot with necrosis of muscle: Secondary | ICD-10-CM

## 2023-05-27 LAB — GLUCOSE, CAPILLARY
Glucose-Capillary: 188 mg/dL — ABNORMAL HIGH (ref 70–99)
Glucose-Capillary: 153 mg/dL — ABNORMAL HIGH (ref 70–99)

## 2023-05-27 NOTE — Progress Notes (Signed)
Post-Op Visit Note   Patient: [name]           Date of Birth: [birth year]           MRN: [id number] Visit Date: 05/27/2023 PCP: Garlan Fillers, MD  Chief Complaint:  Chief Complaint  Patient presents with   Left Achilles Tendon - Routine Post Op    03/04/2023 left achilles debridement     HPI:  HPI The patient is a 57 year old gentleman who is seen in follow-up status post left Achilles debridement on July 31 he has had Kerecis micro powder applied in the office, donated, weekly  He also continues with home exercise program as well as hyperbaric oxygen treatments, has quit smoking. Was seen by vascular 2 weeks ago. No new recs.   Ortho Exam On examination of the left Achilles area of the ulcer is 100% filled in with granulation there is good uptake of the Kerecis powder.  There is no undermining.  The measurements today are 1.5 centimeters by 1.2 cm in width there is 2 mm of depth some mild surrounding erythema however appears to be healing circumferentially. No change in dimensions but has an [place] of healing. No erythema warmth or sign of infection  Donation Kerecis micro powder applied again today.  Visit Diagnoses: No diagnosis found.  Plan: cleansed with vashe. Kerecis micro powder donation reapplied today.  Will change overlying gauze daily as instructed daily for the first week he will follow-up in the office in 1 more week for reevaluation  Follow-Up Instructions: Return in about 1 week (around 06/03/2023).   Imaging: No results found.  Orders:  No orders of the defined types were placed in this encounter.  No orders of the defined types were placed in this encounter.    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  03/22/2020   Osteopenia of multiple sites 02/28/2020   PVD (peripheral vascular disease) (HCC) 10/11/2019   Chronic venous insufficiency 10/05/2018   Diabetic cataract (HCC) 05/21/2018   Controlled type 1 diabetes mellitus with diabetic peripheral angiopathy without gangrene (HCC) 08/14/2017   Dyslipidemia 03/26/2017   High risk medication use 09/23/2016   History of hypertension 09/23/2016   History of chronic kidney disease 09/23/2016   History of coronary artery disease 09/23/2016   Tobacco abuse 09/23/2016   Primary osteoarthritis of both knees 09/23/2016   History of diabetes mellitus 09/23/2016   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 (HCC) 11/20/2015   Chest pain with high risk for cardiac etiology 10/29/2015   Asymptomatic bilateral carotid artery stenosis 06/08/2014   Pleural effusion, bilateral 06/03/2014   Dyspnea 06/01/2014   Diabetes (HCC) 05/03/2014   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/03/2014   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   Chronic renal disease, stage 3, moderately decreased glomerular filtration rate between 30-59 mL/min/1.73 square meter (HCC) 05/03/2014   CAD (coronary artery disease) 04/27/2014   Acne 12/19/2013   On isotretinoin therapy 12/19/2013   Nuclear sclerotic cataract, bilateral 12/19/2011   Vitreous hemorrhage (HCC) 06/16/2011   Past Medical History:  Diagnosis Date   Anginal pain (HCC)    Anxiety    Arthritis    RA IN  HANDS   Asthma    CAD (coronary artery disease) 04/27/2014   Chronic renal disease, stage 3, moderately decreased glomerular filtration rate between 30-59 mL/min/1.73 square meter (HCC) 05/03/2014   COPD (chronic obstructive pulmonary disease) (HCC)    Coronary artery disease    Diabetes (HCC) 05/03/2014   Diabetes mellitus without complication (HCC)    type 1   Hyperlipidemia    Left shoulder pain    Peripheral  vascular disease (HCC)    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/03/2014   Shortness of breath    Sleep apnea    mild OSA, no CPAP   Unstable angina pectoris (HCC) 04/25/2014    Family History  Problem Relation Age of Onset   Stomach cancer Mother    Cancer Mother    Prostate cancer Father    Heart disease Father    Cancer - Prostate Father    Heart disease Brother    Asthma Daughter    Healthy Daughter    Asthma Daughter     Past Surgical History:  Procedure Laterality Date   ABDOMINAL AORTOGRAM W/LOWER EXTREMITY N/A 01/15/2023   Procedure: ABDOMINAL AORTOGRAM W/LOWER EXTREMITY;  Surgeon: Cephus Shelling, MD;  Location: [facility] INVASIVE CV LAB;  Service: Cardiovascular;  Laterality: N/A;   CARDIAC CATHETERIZATION  04/25/2014   BY DR Jacinto Halim   CARDIAC CATHETERIZATION N/A 10/30/2015   Procedure: Left Heart Cath and Cors/Grafts Angiography;  Surgeon: Yates Decamp, MD;  Location: [facility] INVASIVE CV LAB;  Service: Cardiovascular;  Laterality: N/A;   CARPAL TUNNEL RELEASE     CATARACT EXTRACTION, BILATERAL     CORONARY ARTERY BYPASS GRAFT N/A 04/27/2014   Procedure: CORONARY ARTERY BYPASS GRAFTING on pump using left internal mammary artery to LAD coronary artery, right great saphenous vein graft to diagonal coronary artery with sequential to OM1 and circumflex coronary arteries. Right greater saphenous vein graft to posterior descending coronary artery. ;  Surgeon: Delight Ovens, MD;  Location: [facility] OR;  Service: Open Heart Surgery;  Laterality: N   elbow drained Left 05/09/2019   ENDOVEIN HARVEST OF GREATER SAPHENOUS VEIN Right 04/27/2014   Procedure: ENDOVEIN HARVEST OF GREATER SAPHENOUS VEIN;  Surgeon: Delight Ovens, MD;  Location: [facility] OR;  Service: Open Heart Surgery;  Laterality: Right;   EXCISION ORAL TUMOR N/A 03/01/2018   Procedure: EXCISION ORAL TUMOR;  Surgeon: Christia Reading, MD;  Location: [facility];  Service: ENT;  Laterality: N/A;   EYE SURGERY      LASER   EYE SURGERY Bilateral    astigmatism correction    HARDWARE REMOVAL Left 10/03/2020   Procedure: LEFT FOOT REMOVAL HARDWARE, PLACE VANC POWDER;  Surgeon: Nadara Mustard, MD;  Location: [facility] OR;  Service: Orthopedics;  Laterality: Left;   I & D EXTREMITY Left 03/04/2023   Procedure: LEFT ACHILLES DEBRIDEMENT;  Surgeon: Nadara Mustard, MD;  Location: [facility] OR;  Service: Orthopedics;  Laterality: Left;   INTRAOPERATIVE TRANSESOPHAGEAL ECHOCARDIOGRAM N/A 04/27/2014   Procedure: INTRAOPERATIVE TRANSESOPHAGEAL ECHOCARDIOGRAM;  Surgeon: Delight Ovens, MD;  Location: [facility] OR;  Service: Open Heart Surgery;  Laterality: N/A;   IR RADIOLOGIST EVAL & MGMT  12/22/2022   LEFT HEART CATHETERIZATION WITH CORONARY ANGIOGRAM N/A 04/25/2014   Procedure: LEFT HEART CATHETERIZATION WITH CORONARY ANGIOGRAM;  Surgeon: Pamella Pert, MD;  Location: [facility] CATH LAB;  Service: Cardiovascular;  Laterality: N/A;   MOUTH SURGERY  11/15/2017   tongue surgery    ORIF TOE FRACTURE Left 03/22/2020  Procedure: OPEN REDUCTION INTERNAL FIXATION (ORIF) Non Union 5th Metatarsal;  Surgeon: Kathryne Hitch, MD;  Location: [facility];  Service: Orthopedics;  Laterality: Left;   Social History   Occupational History   Not on file  Tobacco Use   Smoking status: Former    Current packs/day: 0.25    Average packs/day: 0.3 packs/day for 34.0 years (8.5 ttl pk-yrs)    Types: Cigarettes   Smokeless tobacco: Never  Vaping Use   Vaping status: Never Used  Substance and Sexual Activity   Alcohol use: Not Currently    Comment: RARE   Drug use: No   Sexual activity: Not on file

## 2023-05-27 NOTE — Progress Notes (Signed)
[name], [name] ([id number]) [id number] Page 1 of 2 Visit Report for 05/27/2023 Arrival Information Details Patient Name: Date of Service: [name], [name] 05/27/2023 7:30 A M Medical Record Number: [id number] Patient Account Number: [id number] Date of Birth/Sex: Treating RN: 04-Nov-1965 (57 y.o. [name] Primary Care Jabe Jeanbaptiste: [name] Other Clinician: [name] Referring Marcy Bogosian: Treating Jiali Linney/Extender: [name] in Treatment: 21 Visit Information History Since Last Visit All ordered tests and consults were completed: Yes Patient Arrived: Ambulatory Added or deleted any medications: No Arrival Time: 07:49 Any new allergies or adverse reactions: No Accompanied By: self Had a fall or experienced change in No Transfer Assistance: None activities of daily living that may affect Patient Identification Verified: Yes risk of falls: Secondary Verification Process Completed: Yes Signs or symptoms of abuse/neglect since last visito No Patient Requires Transmission-Based Precautions: No Hospitalized since last visit: No Patient Has Alerts: No Implantable device outside of the clinic excluding No cellular tissue based products placed in the center since last visit: Pain Present Now: No Electronic Signature(s) Signed: 05/27/2023 11:31:26 AM By: [name] CHT EMT BS , , Entered By: [name] on 05/27/2023 08:31:26 -------------------------------------------------------------------------------- Encounter Discharge Information Details Patient Name: Date of Service: [name] Pont, [name] [name] 05/27/2023 7:30 A M Medical Record Number: [id number] Patient Account Number: [id number] Date of Birth/Sex: Treating RN: [birth year] (57 y.o. [name] Primary Care Shemar Plemmons: [name] Other Clinician: [name] Referring Lakina Mcintire: Treating Rebecca Motta/Extender: [name] in Treatment: 21 Encounter Discharge Information Items Discharge Condition: Stable Ambulatory Status: Ambulatory Discharge Destination: Other (Note Required) Transportation: Private Auto Accompanied By: self Schedule Follow-up Appointment: No Clinical Summary of Care: Notes Patient goes to work after treatment. Electronic Signature(s) Signed: 05/27/2023 11:42:42 AM By: [name] CHT EMT BS , , Entered By: [name] on 05/27/2023 08:42:41 [name] ([id number]) [id number] Page 2 of 2 -------------------------------------------------------------------------------- Vitals Details Patient Name: Date of Service: [name], [name] 05/27/2023 7:30 A M Medical Record Number: [id number] Patient Account Number: [id number] Date of Birth/Sex: Treating RN: [birth year] (57 y.o. [name] Primary Care Harshan Kearley: [name] Other Clinician: [name] Referring Nyasiah Moffet: Treating Xzavien Harada/Extender: [name] in Treatment: 21 Vital Signs Time Taken: 08:01 Temperature (F): 97.9 Height (in): 68 Pulse (bpm): 71 Weight (lbs): 200 Respiratory Rate (breaths/min): 18 Body Mass Index (BMI): 30.4 Blood Pressure (mmHg): 150/64 Capillary Blood Glucose (mg/dl): 831 Reference Range: 80 - 120 mg / dl Electronic Signature(s) Signed: 05/27/2023 11:31:54 AM By: [name] CHT EMT BS , , Entered By: [name] on 05/27/2023 08:31:54

## 2023-05-27 NOTE — Progress Notes (Signed)
LARKIN, KOTILA ([id number]) [id number] Page 1 of 1 Visit Report for 05/27/2023 SuperBill Details Patient Name: Date of Service: [name], [name] 05/27/2023 Medical Record Number: [id number] Patient Account Number: [id number] Date of Birth/Sex: Treating RN: 1966/02/08 (57 y.o. [name] Primary Care Provider: [name] Other Clinician: [name] Referring Provider: Treating Provider/Extender: [name] in Treatment: 21 Diagnosis Coding ICD-10 Codes Code Description [id number] Non-pressure chronic ulcer of other part of left foot with other specified severity I70.245 Atherosclerosis of native arteries of left leg with ulceration of other part of foot 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 Facility Procedures CPT4 Code Description Modifier Quantity [id number] G0277-(Facility Use Only) HBOT full body chamber, , 4 ICD-10 Diagnosis Description 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 Physician Procedures Quantity CPT4 Code Description Modifier [id number] - WC PHYS HYPERBARIC OXYGEN THERAPY 1 ICD-10 Diagnosis Description 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 Electronic Signature(s) Signed: 05/27/2023 11:41:45 AM By: [name] CHT EMT BS , , Signed: 05/27/2023 4:45:44 PM By: Allen Derry PA-C Entered By: [name] on 05/27/2023 08:41:44

## 2023-05-27 NOTE — Progress Notes (Addendum)
[name], LIMING ([id number]) [id number] Page 1 of 2 Visit Report for 05/27/2023 HBO Details Patient Name: Date of Service: [place], [name] 05/27/2023 7:30 A M Medical Record Number: [id number] Patient Account Number: [id number] Date of Birth/Sex: Treating RN: [birth year] (57 y.o. [name] Primary Care Majed Pellegrin: [name] Other Clinician: [name] Referring Kayton Dunaj: Treating Carmon Sahli/Extender: [name] in Treatment: 21 HBO Treatment Course Details Treatment Course Number: 1 Ordering Darron Stuck: Geralyn Corwin T Treatments Ordered: otal 60 HBO Treatment Start Date: 02/16/2023 HBO Indication: Other (specify in Notes) Notes: Acute peripheral arterial insufficiency HBO Treatment Details Treatment Number: 57 Patient Type: Outpatient Chamber Type: Monoplace Chamber Serial #: [id number] Treatment Protocol: 2.0 ATA with 90 minutes oxygen, with two 5 minute air breaks Treatment Details Compression Rate Down: 1.5 psi / minute De-Compression Rate Up: 2.0 psi / minute A breaks and breathing ir Compress Tx Pressure periods Decompress Decompress Begins Reached (leave unused spaces Begins Ends blank) Chamber Pressure (ATA 1 2 2 2 2 2  --2 1 ) Clock Time (24 hr) 08:18 08:31 09:01 09:06 09:36 09:41 - - 10:11 10:23 Treatment Length: 125 (minutes) Treatment Segments: 4 Vital Signs Capillary Blood Glucose Reference Range: 80 - 120 mg / dl HBO Diabetic Blood Glucose Intervention Range: <131 mg/dl or >016 mg/dl Type: Time Vitals Blood Respiratory Capillary Blood Glucose Pulse Action Pulse: Temperature: Taken: Pressure: Rate: Glucose (mg/dl): Meter #: Oximetry (%) Taken: Pre 08:01 150/64 71 18 97.9 188 none per protocol Post 10:25 138/63 68 18 97.9 153 none per protocol Treatment Response Treatment Toleration: Well Treatment Completion Status: Treatment Completed without Adverse Event Treatment Notes Mr.  Steenbergen arrived with normal vital signs and stated that he had eaten breakfast. He has been avoiding caffeine as much as possible based on data suggesting caffeine reduces effectiveness of HBOT He stated he had a small sip of coffee this morning. He prepared for treatment. After performing a . safety check, he was placed in the chamber which was compressed at a rate of 2 psi/min after confirming normal ear equalization. He tolerated the treatment and subsequent decompression of the chamber at a rate of 2 psi/min. He was stable upon discharge. Electronic Signature(s) Signed: 05/27/2023 11:41:21 AM By: [name] CHT EMT BS , , Signed: 05/27/2023 4:45:44 PM By: Allen Derry PA-C Entered By: [name] on 05/27/2023 08:41:21 [name] ([id number]) [id number] Page 2 of 2 -------------------------------------------------------------------------------- HBO Safety Checklist Details Patient Name: Date of Service: [name], [name] 05/27/2023 7:30 A M Medical Record Number: [id number] Patient Account Number: [id number] Date of Birth/Sex: Treating RN: [birth year] (57 y.o. [name] Primary Care Lanetta Figuero: [name] Other Clinician: [name] Referring Nicle Connole: Treating Nas Wafer/Extender: [name] in Treatment: 21 HBO Safety Checklist Items Safety Checklist Consent Form Signed Patient voided / foley secured and emptied 0700 - Egg Sandwich, Protein Shake, Small sip When did you last eato of coffee Last dose of injectable or oral agent 0630 - 17 units of Lantus Ostomy pouch emptied and vented if applicable NA All implantable devices assessed, documented and approved Freestyle Libre 2 and3 on approved list Intravenous access site secured and place NA Valuables secured Linens and cotton and cotton/polyester blend (less than 51% polyester) Personal oil-based products / skin lotions / body lotions  removed Wigs or hairpieces removed NA Smoking or tobacco materials removed NA Books / newspapers / magazines / loose paper removed Cologne, aftershave, perfume and deodorant removed Jewelry removed (may wrap wedding  band) Make-up removed NA Hair care products removed Libre Freestyle 2 and 3 Sensor Approved, other Battery operated devices (external) removed electronics removed. Heating patches and chemical warmers removed Titanium eyewear removed Nail polish cured greater than 10 hours NA Casting material cured greater than 10 hours NA Hearing aids removed NA Loose dentures or partials removed NA Prosthetics have been removed NA Patient demonstrates correct use of air break device (if applicable) Patient concerns have been addressed Patient grounding bracelet on and cord attached to chamber Specifics for Inpatients (complete in addition to above) Medication sheet sent with patient NA Intravenous medications needed or due during therapy sent with patient NA Drainage tubes (e.g. nasogastric tube or chest tube secured and vented) NA Endotracheal or Tracheotomy tube secured NA Cuff deflated of air and inflated with saline NA Airway suctioned NA Notes Paper version used prior to treatment start. Electronic Signature(s) Signed: 05/27/2023 11:33:36 AM By: [name] CHT EMT BS , , Entered By: [name] on 05/27/2023 08:33:35

## 2023-05-28 ENCOUNTER — Encounter (HOSPITAL_BASED_OUTPATIENT_CLINIC_OR_DEPARTMENT_OTHER): Payer: 59 | Admitting: Internal Medicine

## 2023-05-28 DIAGNOSIS — E10621 Type 1 diabetes mellitus with foot ulcer: Secondary | ICD-10-CM | POA: Diagnosis not present

## 2023-05-28 LAB — GLUCOSE, CAPILLARY
Glucose-Capillary: 219 mg/dL — ABNORMAL HIGH (ref 70–99)
Glucose-Capillary: 113 mg/dL — ABNORMAL HIGH (ref 70–99)

## 2023-05-28 NOTE — Progress Notes (Addendum)
GOKUL, ARRANT ([id number]) [id number] Page 1 of 2 Visit Report for 05/28/2023 HBO Details Patient Name: Date of Service: [place], [name] 05/28/2023 7:30 A M Medical Record Number: [id number] Patient Account Number: [id number] Date of Birth/Sex: Treating RN: 1965-12-09 (57 y.o. [name] Primary Care Vilas Edgerly: [name] Other Clinician: [name] Referring Cathaleen Korol: Treating [employer]/Extender: Theodis Sato in Treatment: 22 HBO Treatment Course Details Treatment Course Number: 1 Ordering Chanceler Pullin: Geralyn Corwin T Treatments Ordered: otal 60 HBO Treatment Start Date: 02/16/2023 HBO Indication: Other (specify in Notes) Notes: Acute peripheral arterial insufficiency HBO Treatment Details Treatment Number: 58 Patient Type: Outpatient Chamber Type: Monoplace Chamber Serial #: [id number] Treatment Protocol: 2.0 ATA with 90 minutes oxygen, with two 5 minute air breaks Treatment Details Compression Rate Down: 1.5 psi / minute De-Compression Rate Up: 2.0 psi / minute A breaks and breathing ir Compress Tx Pressure periods Decompress Decompress Begins Reached (leave unused spaces Begins Ends blank) Chamber Pressure (ATA 1 2 2 2 2 2  --2 1 ) Clock Time (24 hr) 08:10 08:22 08:52 08:57 09:27 09:32 - - 10:02 10:14 Treatment Length: 124 (minutes) Treatment Segments: 4 Vital Signs Capillary Blood Glucose Reference Range: 80 - 120 mg / dl HBO Diabetic Blood Glucose Intervention Range: <131 mg/dl or >782 mg/dl Type: Time Vitals Blood Respiratory Capillary Blood Glucose Pulse Action Pulse: Temperature: Taken: Pressure: Rate: Glucose (mg/dl): Meter #: Oximetry (%) Taken: Pre 07:48 140/68 76 18 97.7 219 none per protocol Post 10:53 130/69 69 18 97.8 113 none per protocol Treatment Response Treatment Toleration: Well Treatment Completion Status: Treatment Completed without Adverse Event Treatment Notes Mr.  Klecha arrived with normal vital signs and stated that he had eaten breakfast and a few small sips of coffee this morning. He prepared for treatment. After performing a safety check, he was placed in the chamber which was compressed with 100% oxygen at a rate of 2 psi/min after confirming normal ear equalization. He tolerated the treatment and subsequent decompression of the chamber at a rate of 2 psi/min. He was stable upon discharge. Bertis Hustead Notes No concerns with treatment given Physician HBO Attestation: I certify that I supervised this HBO treatment in accordance with Medicare guidelines. A trained emergency response team is readily available per Yes hospital policies and procedures. Continue HBOT as ordered. Yes Electronic Signature(s) Signed: 05/29/2023 12:59:16 PM By: [name] MD Previous Signature: 05/28/2023 4:37:05 PM Version By: [name] CHT EMT BS , , Entered By: [name] on 05/28/2023 13:57:09 Van, [name] ([id number]) [id number] Page 2 of 2 -------------------------------------------------------------------------------- HBO Safety Checklist Details Patient Name: Date of Service: [place], [name] 05/28/2023 7:30 A M Medical Record Number: [id number] Patient Account Number: [id number] Date of Birth/Sex: Treating RN: 12-16-1965 (57 y.o. [name] Primary Care Rosellen Lichtenberger: [name] Other Clinician: [name] Referring Keithen Capo: Treating Sonda Coppens/Extender: Theodis Sato in Treatment: 22 HBO Safety Checklist Items Safety Checklist Consent Form Signed Patient voided / foley secured and emptied When did you last eato 0700 - Waffles, Protein Shake, Sips of coffee Last dose of injectable or oral agent 0630 17 units Lantus Ostomy pouch emptied and vented if applicable NA All implantable devices assessed, documented and approved Intravenous access site secured and  place NA Valuables secured Linens and cotton and cotton/polyester blend (less than 51% polyester) Personal oil-based products / skin lotions / body lotions removed Wigs or hairpieces removed NA Smoking or tobacco materials removed NA Books / newspapers / magazines / loose  paper removed Cologne, aftershave, perfume and deodorant removed Jewelry removed (may wrap wedding band) Make-up removed NA Hair care products removed Libre Freestyle 2 and 3 Sensor Approved, other Battery operated devices (external) removed electronics removed. Heating patches and chemical warmers removed Titanium eyewear removed Nail polish cured greater than 10 hours NA Casting material cured greater than 10 hours NA Hearing aids removed NA Loose dentures or partials removed NA Prosthetics have been removed NA Patient demonstrates correct use of air break device (if applicable) Patient concerns have been addressed Patient grounding bracelet on and cord attached to chamber Specifics for Inpatients (complete in addition to above) Medication sheet sent with patient NA Intravenous medications needed or due during therapy sent with patient NA Drainage tubes (e.g. nasogastric tube or chest tube secured and vented) NA Endotracheal or Tracheotomy tube secured NA Cuff deflated of air and inflated with saline NA Airway suctioned NA Notes Paper version used prior to treatment start. Electronic Signature(s) Signed: 05/28/2023 4:35:02 PM By: [name] CHT EMT BS , , Entered By: [name] on 05/28/2023 13:35:01

## 2023-05-28 NOTE — Progress Notes (Signed)
[name] ([id number]) [id number] Page 1 of 2 Visit Report for 05/28/2023 Arrival Information Details Patient Name: Date of Service: [name], [name] 05/28/2023 7:30 A M Medical Record Number: [id number] Patient Account Number: [id number] Date of Birth/Sex: Treating RN: 1965/10/31 (57 y.o. [name] Primary Care Chealsey Miyamoto: [name] Other Clinician: [name] Referring Kloee Ballew: Treating Jhalil Silvera/Extender: Theodis Sato in Treatment: 22 Visit Information History Since Last Visit All ordered tests and consults were completed: Yes Patient Arrived: Ambulatory Added or deleted any medications: No Arrival Time: 07:37 Any new allergies or adverse reactions: No Accompanied By: self Had a fall or experienced change in No Transfer Assistance: None activities of daily living that may affect Patient Identification Verified: Yes risk of falls: Secondary Verification Process Completed: Yes Signs or symptoms of abuse/neglect since last visito No Patient Requires Transmission-Based Precautions: No Hospitalized since last visit: No Patient Has Alerts: No Implantable device outside of the clinic excluding No cellular tissue based products placed in the center since last visit: Pain Present Now: No Electronic Signature(s) Signed: 05/28/2023 4:32:43 PM By: [name] CHT EMT BS , , Entered By: [name] on 05/28/2023 13:32:42 -------------------------------------------------------------------------------- Encounter Discharge Information Details Patient Name: Date of Service: [name], [name] [name] 05/28/2023 7:30 A M Medical Record Number: [id number] Patient Account Number: [id number] Date of Birth/Sex: Treating RN: 09-30-1965 (57 y.o. [name] Primary Care Emaad Nanna: [name] Other Clinician: [name] Referring Mackena Plummer: Treating Lux Skilton/Extender: Theodis Sato in Treatment: 22 Encounter Discharge Information Items Discharge Condition: Stable Ambulatory Status: Ambulatory Discharge Destination: Home Transportation: Private Auto Accompanied By: self Schedule Follow-up Appointment: No Clinical Summary of Care: Electronic Signature(s) Signed: 05/28/2023 4:40:02 PM By: [name] CHT EMT BS , , Entered By: [name] on 05/28/2023 13:40:02 Loisel, [name] ([id number]) [id number] Page 2 of 2 -------------------------------------------------------------------------------- Vitals Details Patient Name: Date of Service: [name], [name] 05/28/2023 7:30 A M Medical Record Number: [id number] Patient Account Number: [id number] Date of Birth/Sex: Treating RN: 02-03-1966 (57 y.o. [name] Primary Care Aijalon Kirtz: [name] Other Clinician: [name] Referring Antawan Mchugh: Treating Jayvyn Haselton/Extender: Theodis Sato in Treatment: 22 Vital Signs Time Taken: 07:48 Temperature (F): 97.7 Height (in): 68 Pulse (bpm): 76 Weight (lbs): 200 Respiratory Rate (breaths/min): 18 Body Mass Index (BMI): 30.4 Blood Pressure (mmHg): 140/68 Capillary Blood Glucose (mg/dl): 703 Reference Range: 80 - 120 mg / dl Electronic Signature(s) Signed: 05/28/2023 4:33:02 PM By: [name] CHT EMT BS , , Entered By: [name] on 05/28/2023 13:33:02

## 2023-05-29 ENCOUNTER — Encounter (HOSPITAL_BASED_OUTPATIENT_CLINIC_OR_DEPARTMENT_OTHER): Payer: 59 | Admitting: General Surgery

## 2023-05-29 NOTE — Progress Notes (Signed)
CLEASON, FEICK ([id number]) [id number] Page 1 of 1 Visit Report for 05/28/2023 SuperBill Details Patient Name: Date of Service: [place], [name] 05/28/2023 Medical Record Number: [id number] Patient Account Number: [id number] Date of Birth/Sex: Treating RN: [birth year] (57 y.o. 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 Facility Procedures CPT4 Code Description Modifier Quantity [id number] G0277-(Facility Use Only) HBOT full body chamber, , 4 ICD-10 Diagnosis Description 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

## 2023-06-01 ENCOUNTER — Ambulatory Visit (HOSPITAL_COMMUNITY)
Admission: RE | Admit: 2023-06-01 | Discharge: 2023-06-01 | Disposition: A | Discharge status: Home / Self Care | Payer: 59 | Source: Ambulatory Visit | Attending: Surgery | Admitting: Surgery

## 2023-06-01 ENCOUNTER — Ambulatory Visit (INDEPENDENT_AMBULATORY_CARE_PROVIDER_SITE_OTHER)
Admission: RE | Admit: 2023-06-01 | Discharge: 2023-06-01 | Disposition: A | Discharge status: Home / Self Care | Payer: 59 | Source: Ambulatory Visit | Attending: Surgery | Admitting: Surgery

## 2023-06-01 ENCOUNTER — Encounter (HOSPITAL_BASED_OUTPATIENT_CLINIC_OR_DEPARTMENT_OTHER): Payer: 59 | Admitting: Internal Medicine

## 2023-06-01 DIAGNOSIS — I739 Peripheral vascular disease, unspecified: Secondary | ICD-10-CM | POA: Diagnosis not present

## 2023-06-01 DIAGNOSIS — S81802A Unspecified open wound, left lower leg, initial encounter: Secondary | ICD-10-CM | POA: Diagnosis not present

## 2023-06-01 DIAGNOSIS — E10621 Type 1 diabetes mellitus with foot ulcer: Secondary | ICD-10-CM | POA: Diagnosis not present

## 2023-06-01 LAB — GLUCOSE, CAPILLARY
Glucose-Capillary: 215 mg/dL — ABNORMAL HIGH (ref 70–99)
Glucose-Capillary: 175 mg/dL — ABNORMAL HIGH (ref 70–99)

## 2023-06-01 LAB — VAS US ABI WITH/WO TBI: Left ABI: 0.94

## 2023-06-01 NOTE — Progress Notes (Signed)
CAVANI, DEBRUYN ([id number]) [id number] Page 1 of 2 Visit Report for 06/01/2023 Arrival Information Details Patient Name: Date of Service: [place], [name] 06/01/2023 7:30 A M Medical Record Number: [id number] Patient Account Number: [id number] Date of Birth/Sex: Treating RN: 1966-03-07 (57 y.o. Bayard Hugger, Bonita Quin Primary Care Erilyn Pearman: [name] Other Clinician: [name] Referring Duaine Radin: Treating Smaran Gaus/Extender: Theodis Sato in Treatment: 22 Visit Information History Since Last Visit All ordered tests and consults were completed: Yes Patient Arrived: Ambulatory Added or deleted any medications: No Arrival Time: 07:33 Any new allergies or adverse reactions: No Accompanied By: self Had a fall or experienced change in No Transfer Assistance: None activities of daily living that may affect Patient Identification Verified: Yes risk of falls: Secondary Verification Process Completed: Yes Signs or symptoms of abuse/neglect since last visito No Patient Requires Transmission-Based Precautions: No Hospitalized since last visit: No Patient Has Alerts: No Implantable device outside of the clinic excluding No cellular tissue based products placed in the center since last visit: Pain Present Now: No Electronic Signature(s) Signed: 06/01/2023 12:10:51 PM By: [name] CHT EMT BS , , Entered By: [name] on 06/01/2023 09:10:51 -------------------------------------------------------------------------------- Encounter Discharge Information Details Patient Name: Date of Service: [name], [name] 06/01/2023 7:30 A M Medical Record Number: [id number] Patient Account Number: [id number] Date of Birth/Sex: Treating RN: 05-29-1966 (57 y.o. [name] Primary Care Rosan Calbert: [name] Other Clinician: [name] Referring Molleigh Huot: Treating Danielle Lento/Extender: Theodis Sato in Treatment: 22 Encounter Discharge Information Items Discharge Condition: Stable Ambulatory Status: Ambulatory Discharge Destination: Home Transportation: Private Auto Accompanied By: self Schedule Follow-up Appointment: No Clinical Summary of Care: Electronic Signature(s) Signed: 06/01/2023 12:47:48 PM By: [name] CHT EMT BS , , Entered By: [name] on 06/01/2023 09:47:48 Eskin, [name] ([id number]) [id number] Page 2 of 2 -------------------------------------------------------------------------------- Vitals Details Patient Name: Date of Service: [place], [name] 06/01/2023 7:30 A M Medical Record Number: [id number] Patient Account Number: [id number] Date of Birth/Sex: Treating RN: 09/12/1965 (57 y.o. [name] Primary Care Neera Teng: [name] Other Clinician: Karl Bales Referring Zohar Laing: Treating Shaquia Berkley/Extender: Theodis Sato in Treatment: 22 Vital Signs Time Taken: 07:43 Temperature (F): 97.5 Height (in): 68 Pulse (bpm): 79 Weight (lbs): 200 Respiratory Rate (breaths/min): 18 Body Mass Index (BMI): 30.4 Blood Pressure (mmHg): 112/59 Capillary Blood Glucose (mg/dl): 109 Reference Range: 80 - 120 mg / dl Electronic Signature(s) Signed: 06/01/2023 12:11:20 PM By: [name] CHT EMT BS , , Entered By: [name] on 06/01/2023 09:11:20

## 2023-06-01 NOTE — Progress Notes (Signed)
DELANCEY, TALAMO ([id number]) [id number] Page 1 of 2 Visit Report for 06/01/2023 HBO Details Patient Name: Date of Service: [place], [name] 06/01/2023 7:30 A M Medical Record Number: [id number] Patient Account Number: [id number] Date of Birth/Sex: Treating RN: 09-Feb-1966 (57 y.o. [name] Primary Care Ambry Dix: [name] Other Clinician: [name] Referring Janett Kamath: Treating Teresea Donley/Extender: Theodis Sato in Treatment: 22 HBO Treatment Course Details Treatment Course Number: 1 Ordering Aldrich Lloyd: Geralyn Corwin T Treatments Ordered: otal 60 HBO Treatment Start Date: 02/16/2023 HBO Indication: Other (specify in Notes) Notes: Acute peripheral arterial insufficiency HBO Treatment Details Treatment Number: 59 Patient Type: Outpatient Chamber Type: Monoplace Chamber Serial #: [id number] Treatment Protocol: 2.0 ATA with 90 minutes oxygen, with two 5 minute air breaks Treatment Details Compression Rate Down: 1.5 psi / minute De-Compression Rate Up: 2.0 psi / minute A breaks and breathing ir Compress Tx Pressure periods Decompress Decompress Begins Reached (leave unused spaces Begins Ends blank) Chamber Pressure (ATA 1 2 2 2 2 2  --2 1 ) Clock Time (24 hr) 07:58 08:10 08:42 08:47 09:17 09:22 - - 09:50 10:00 Treatment Length: 122 (minutes) Treatment Segments: 4 Vital Signs Capillary Blood Glucose Reference Range: 80 - 120 mg / dl HBO Diabetic Blood Glucose Intervention Range: <131 mg/dl or >086 mg/dl Type: Time Vitals Blood Respiratory Capillary Blood Glucose Pulse Action Pulse: Temperature: Taken: Pressure: Rate: Glucose (mg/dl): Meter #: Oximetry (%) Taken: Pre 07:43 112/59 79 18 97.5 215 none per protocol Post 10:06 129/63 67 18 97.5 175 none per protocol Treatment Response Treatment Toleration: Well Treatment Completion Status: Treatment Completed without Adverse Event Treatment Notes Mr.  Plachy arrived with normal vital signs and stated that he had eaten breakfast and a small amount of coffee this morning. He prepared for treatment. After performing a safety check, he was placed in the chamber which was compressed with 100% oxygen at a rate of 2 psi/min after confirming normal ear equalization. He tolerated the treatment and subsequent decompression of the chamber at a rate of 2 psi/min. He denied any issues with ear equalization and/or pain caused by barotrauma. Post-treatment vital signs within normal range. He was stable upon discharge. Lenus Trauger Notes No concerns with treatment given Physician HBO Attestation: I certify that I supervised this HBO treatment in accordance with Medicare guidelines. A trained emergency response team is readily available per Yes hospital policies and procedures. Continue HBOT as ordered. Yes Electronic Signature(s) Signed: 06/02/2023 8:53:04 AM By: [name] MD Previous Signature: 06/01/2023 12:46:45 PM Version By: [name] CHT EMT BS , , Entered By: [name] on 06/01/2023 13:41:53 Kolasinski, [name] ([id number]) [id number] Page 2 of 2 -------------------------------------------------------------------------------- HBO Safety Checklist Details Patient Name: Date of Service: [place], [name] 06/01/2023 7:30 A M Medical Record Number: [id number] Patient Account Number: [id number] Date of Birth/Sex: Treating RN: 1966-03-13 (57 y.o. [name] Primary Care Jaylinn Hellenbrand: [name] Other Clinician: [name] Referring Avelino Herren: Treating Jax Kentner/Extender: Theodis Sato in Treatment: 22 HBO Safety Checklist Items Safety Checklist Consent Form Signed Patient voided / foley secured and emptied When did you last eato 0700 - Breakfast Last dose of injectable or oral agent 0630 17 units Lantus Ostomy pouch emptied and vented if applicable NA All  implantable devices assessed, documented and approved Freestyle Libre 2 and3 on approved list Intravenous access site secured and place NA Valuables secured Linens and cotton and cotton/polyester blend (less than 51% polyester) Personal oil-based products / skin lotions / body lotions  removed Wigs or hairpieces removed NA Smoking or tobacco materials removed NA Books / newspapers / magazines / loose paper removed Cologne, aftershave, perfume and deodorant removed Jewelry removed (may wrap wedding band) Make-up removed Hair care products removed Libre Freestyle 2 and 3 Sensor Approved, other Battery operated devices (external) removed electronics removed. Heating patches and chemical warmers removed Titanium eyewear removed Nail polish cured greater than 10 hours NA Casting material cured greater than 10 hours NA Hearing aids removed NA Loose dentures or partials removed NA Prosthetics have been removed NA Patient demonstrates correct use of air break device (if applicable) Patient concerns have been addressed Patient grounding bracelet on and cord attached to chamber Specifics for Inpatients (complete in addition to above) Medication sheet sent with patient NA Intravenous medications needed or due during therapy sent with patient NA Drainage tubes (e.g. nasogastric tube or chest tube secured and vented) NA Endotracheal or Tracheotomy tube secured NA Cuff deflated of air and inflated with saline NA Airway suctioned NA Notes Paper version used prior to treatment start. Electronic Signature(s) Signed: 06/01/2023 12:12:54 PM By: [name] CHT EMT BS , , Entered By: [name] on 06/01/2023 09:12:53

## 2023-06-02 ENCOUNTER — Encounter (HOSPITAL_BASED_OUTPATIENT_CLINIC_OR_DEPARTMENT_OTHER): Payer: 59 | Admitting: Internal Medicine

## 2023-06-02 DIAGNOSIS — E10621 Type 1 diabetes mellitus with foot ulcer: Secondary | ICD-10-CM | POA: Diagnosis not present

## 2023-06-02 LAB — GLUCOSE, CAPILLARY
Glucose-Capillary: 242 mg/dL — ABNORMAL HIGH (ref 70–99)
Glucose-Capillary: 218 mg/dL — ABNORMAL HIGH (ref 70–99)

## 2023-06-02 NOTE — Progress Notes (Addendum)
LIMUEL, NIEBLAS ([id number]) [id number] Page 1 of 9 Visit Report for 06/02/2023 Arrival Information Details Patient Name: Date of Service: [place], [name] 06/02/2023 10:30 A M Medical Record Number: [id number] Patient Account Number: [id number] Date of Birth/Sex: Treating RN: 03-03-1966 (57 y.o. [name], [name] Primary Care Shayle Donahoo: [name] Other Clinician: Referring Raed Schalk: Treating Farhiya Rosten/Extender: Theodis Sato in Treatment: 22 Visit Information History Since Last Visit Added or deleted any medications: No Patient Arrived: Ambulatory Any new allergies or adverse reactions: No Arrival Time: 11:00 Had a fall or experienced change in No Accompanied By: self activities of daily living that may affect Transfer Assistance: None risk of falls: Patient Identification Verified: Yes Signs or symptoms of abuse/neglect since last visito No Secondary Verification Process Completed: Yes Hospitalized since last visit: No Patient Requires Transmission-Based Precautions: No Implantable device outside of the clinic excluding No Patient Has Alerts: No cellular tissue based products placed in the center since last visit: Has Dressing in Place as Prescribed: Yes Pain Present Now: No Electronic Signature(s) Signed: 06/04/2023 6:03:32 PM By: Shawn Stall RN, BSN Entered By: Shawn Stall on 06/02/2023 08:00:34 -------------------------------------------------------------------------------- Clinic Level of Care Assessment Details Patient Name: Date of Service: [place], [name] 06/02/2023 10:30 A M Medical Record Number: [id number] Patient Account Number: [id number] Date of Birth/Sex: Treating RN: 01/07/1966 (57 y.o. [name] Primary Care Antoine Vandermeulen: [name] Other Clinician: Referring Delayna Sparlin: Treating Jaella Weinert/Extender: Theodis Sato in Treatment: 22 Clinic Level of Care  Assessment Items TOOL 4 Quantity Score X- 1 0 Use when only an EandM is performed on FOLLOW-UP visit ASSESSMENTS - Nursing Assessment / Reassessment X- 1 10 Reassessment of Co-morbidities (includes updates in patient status) X- 1 5 Reassessment of Adherence to Treatment Plan ASSESSMENTS - Wound and Skin A ssessment / Reassessment X - Simple Wound Assessment / Reassessment - one wound 1 5 []  - 0 Complex Wound Assessment / Reassessment - multiple wounds []  - 0 Dermatologic / Skin Assessment (not related to wound area) ASSESSMENTS - Focused Assessment X- 1 5 Circumferential Edema Measurements - multi extremities []  - 0 Nutritional Assessment / Counseling / Intervention AUSTEN, OYSTER ([id number]) [id number] Page 2 of 9 []  - 0 Lower Extremity Assessment (monofilament, tuning fork, pulses) []  - 0 Peripheral Arterial Disease Assessment (using hand held doppler) ASSESSMENTS - Ostomy and/or Continence Assessment and Care []  - 0 Incontinence Assessment and Management []  - 0 Ostomy Care Assessment and Management (repouching, etc.) PROCESS - Coordination of Care X - Simple Patient / Family Education for ongoing care 1 15 []  - 0 Complex (extensive) Patient / Family Education for ongoing care X- 1 10 Staff obtains Chiropractor, Records, T Results / Process Orders est []  - 0 Staff telephones HHA, Nursing Homes / Clarify orders / etc []  - 0 Routine Transfer to another Facility (non-emergent condition) []  - 0 Routine Hospital Admission (non-emergent condition) []  - 0 New Admissions / Manufacturing engineer / Ordering NPWT Apligraf, etc. , []  - 0 Emergency Hospital Admission (emergent condition) X- 1 10 Simple Discharge Coordination []  - 0 Complex (extensive) Discharge Coordination PROCESS - Special Needs []  - 0 Pediatric / Minor Patient Management []  - 0 Isolation Patient Management []  - 0 Hearing / Language / Visual special needs []  -  0 Assessment of Community assistance (transportation, D/C planning, etc.) []  - 0 Additional assistance / Altered mentation []  - 0 Support Surface(s) Assessment (bed, cushion, seat, etc.) INTERVENTIONS - Wound Cleansing / Measurement []  - 0  Simple Wound Cleansing - one wound []  - 0 Complex Wound Cleansing - multiple wounds X- 1 5 Wound Imaging (photographs - any number of wounds) []  - 0 Wound Tracing (instead of photographs) X- 1 5 Simple Wound Measurement - one wound []  - 0 Complex Wound Measurement - multiple wounds INTERVENTIONS - Wound Dressings X - Small Wound Dressing one or multiple wounds 1 10 []  - 0 Medium Wound Dressing one or multiple wounds []  - 0 Large Wound Dressing one or multiple wounds []  - 0 Application of Medications - topical []  - 0 Application of Medications - injection INTERVENTIONS - Miscellaneous []  - 0 External ear exam []  - 0 Specimen Collection (cultures, biopsies, blood, body fluids, etc.) []  - 0 Specimen(s) / Culture(s) sent or taken to Lab for analysis []  - 0 Patient Transfer (multiple staff / Nurse, adult / Similar devices) []  - 0 Simple Staple / Suture removal (25 or less) []  - 0 Complex Staple / Suture removal (26 or more) []  - 0 Hypo / Hyperglycemic Management (close monitor of Blood Glucose) JAWAAN, ADACHI ([id number]) [id number] Page 3 of 9 []  - 0 Ankle / Brachial Index (ABI) - do not check if billed separately X- 1 5 Vital Signs Has the patient been seen at the hospital within the last three years: Yes Total Score: 85 Level Of Care: New/Established - Level 3 Electronic Signature(s) Signed: 06/04/2023 6:03:32 PM By: Shawn Stall RN, BSN Entered By: Shawn Stall on 06/02/2023 08:19:41 -------------------------------------------------------------------------------- Encounter Discharge Information Details Patient Name: Date of Service: [name], [name] 06/02/2023 10:30 A M Medical Record Number:  [id number] Patient Account Number: [id number] Date of Birth/Sex: Treating RN: [birth year] (57 y.o. [name] Primary Care Crystalynn Mcinerney: [name] Other Clinician: Referring Oriana Horiuchi: Treating Sani Loiseau/Extender: Theodis Sato in Treatment: 22 Encounter Discharge Information Items Discharge Condition: Stable Ambulatory Status: Ambulatory Discharge Destination: Home Transportation: Private Auto Accompanied By: self Schedule Follow-up Appointment: Yes Clinical Summary of Care: Patient Declined Electronic Signature(s) Signed: 06/04/2023 6:03:32 PM By: Shawn Stall RN, BSN Entered By: Shawn Stall on 06/02/2023 08:20:27 -------------------------------------------------------------------------------- Lower Extremity Assessment Details Patient Name: Date of Service: [place], [name] 06/02/2023 10:30 A M Medical Record Number: [id number] Patient Account Number: [id number] Date of Birth/Sex: Treating RN: 1965-09-24 (57 y.o. [name] Primary Care Nariya Neumeyer: [name] Other Clinician: Referring Quetzali Heinle: Treating Rowan Blaker/Extender: Theodis Sato in Treatment: 22 Edema Assessment Assessed: [name]: Yes] Franne Forts: No] Edema: [Left: N] [Right: o] Calf Left: Right: Point of Measurement: 32 cm From Medial Instep 34 cm Ankle Left: Right: Point of Measurement: 13 cm From Medial Instep 21 cm Vascular Assessment FINIS, HENDRICKSEN ([id number]) [Right:[id number] Page 4 of 9] Pulses: Dorsalis Pedis Palpable: [Left:Yes] Extremity colors, hair growth, and conditions: Extremity Color: [Left:Normal] Hair Growth on Extremity: [Left:Yes] Temperature of Extremity: [Left:Warm] Capillary Refill: [Left:< 3 seconds] Dependent Rubor: [Left:No] Blanched when Elevated: [Left:No No] Toe Nail Assessment Left: Right: Thick: No Discolored: No Deformed: No Improper Length and Hygiene: No Electronic  Signature(s) Signed: 06/04/2023 6:03:32 PM By: Shawn Stall RN, BSN Entered By: Shawn Stall on 06/02/2023 08:01:35 -------------------------------------------------------------------------------- Multi Wound Chart Details Patient Name: Date of Service: [name], [name] 06/02/2023 10:30 A M Medical Record Number: [id number] Patient Account Number: [id number] Date of Birth/Sex: Treating RN: 1966-02-08 (57 y.o. 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 N/A N/A Secondary Etiology: Cataracts, Coronary Artery Disease, N/A N/A Comorbid History: Hypertension, Peripheral Venous Disease, Type I Diabetes, Rheumatoid Arthritis, Osteoarthritis 12/07/2022 N/A N/A Date Acquired: 22 N/A N/A Weeks of Treatment: Open N/A N/A Wound Status: No N/A N/A Wound Recurrence: 1x0.9x0.2 N/A N/A Measurements L x W x D (cm) 0.707 N/A N/A A (cm) : rea 0.141 N/A N/A Volume (cm) : -40.60% N/A N/A % Reduction in Area: -182.00% N/A N/A % Reduction in VolumeYAZEED, PRYER ([id number]) [id number] Page 5 of 9 Grade 2 N/A N/A Classification: Medium N/A N/A Exudate A mount: Serous N/A N/A Exudate Type: amber N/A N/A Exudate Color: Distinct, outline attached N/A N/A Wound Margin: None Present (0%) N/A N/A Granulation A mount: None Present (0%) N/A N/A Necrotic A mount: Fat Layer (Subcutaneous Tissue): Yes N/A N/A Exposed  Structures: Fascia: No Tendon: No Muscle: No Joint: No Bone: No Small (1-33%) N/A N/A Epithelialization: Excoriation: No N/A N/A Periwound Skin Texture: Induration: No Callus: No Crepitus: No Rash: No Scarring: No Maceration: No N/A N/A Periwound Skin Moisture: Dry/Scaly: No Atrophie Blanche: No N/A N/A Periwound Skin Color: Cyanosis: No Ecchymosis: No Erythema: No Hemosiderin Staining: No Mottled: No Pallor: No Rubor: No unable to assess wound bed- N/A N/A Assessment Notes: measured the wound with adaptic in place and did not remove- cared for by another Manvi Guilliams. Treatment Notes Electronic Signature(s) Signed: 06/02/2023 5:13:18 PM By: [name] MD Entered By: [name] on 06/02/2023 08:19:10 -------------------------------------------------------------------------------- Multi-Disciplinary Care Plan Details Patient Name: Date of Service: [place], [name] 06/02/2023 10:30 A M Medical Record Number: [id number] Patient Account Number: [id number] Date of Birth/Sex: Treating RN: 1966/07/16 (57 y.o. [name], [name] Primary Care Aleighna Wojtas: [name] Other Clinician: Referring Paulanthony Gleaves: Treating Nathyn Luiz/Extender: Theodis Sato in Treatment: 22 Active Inactive HBO Nursing Diagnoses: Potential for barotraumas to ears, sinuses, teeth, and lungs or cerebral gas embolism related to changes in atmospheric pressure inside hyperbaric oxygen chamber Potential for oxygen toxicity seizures related to delivery of 100% oxygen at an increased atmospheric pressure Potential for pulmonary oxygen toxicity related to delivery of 100% oxygen at an increased atmospheric pressure Goals: Patient and/or family will be able to state/discuss factors appropriate to the management of their disease process during treatment Date Initiated: 02/16/2023 Target Resolution Date: 07/14/2023 Goal Status: Active Patient will tolerate the hyperbaric  oxygen therapy treatment Date Initiated: 02/16/2023 Target Resolution Date: 07/14/2023 Goal Status: Active Patient/caregiver will verbalize understanding of HBO goals, rationale, procedures and potential hazards Date Initiated: 02/16/2023 Target Resolution Date: 07/14/2023 GARNER, DULLEA ([id number]) [id number] Page 6 of 9 Goal Status: Active Interventions: Administer the correct therapeutic gas delivery based on the patients needs and limitations, per physician order Assess and provide for patients comfort related to the hyperbaric environment and equalization of middle ear Assess for signs and symptoms related to adverse events, including but not limited to confinement anxiety, pneumothorax, oxygen toxicity and baurotrauma Assess patient for any history of confinement anxiety Assess patient's knowledge and expectations regarding hyperbaric medicine and provide education related to the hyperbaric environment, goals of treatment and prevention of adverse events Implement protocols to decrease risk of pneumothorax in high risk patients Notes: Wound/Skin Impairment Nursing Diagnoses: Impaired tissue integrity Knowledge deficit related to smoking impact on wound healing  Knowledge deficit related to ulceration/compromised skin integrity Goals: Patient will have a decrease in wound volume by X% from date: (specify in notes) Date Initiated: 12/25/2022 Target Resolution Date: 07/14/2023 Goal Status: Active Patient/caregiver will verbalize understanding of skin care regimen Date Initiated: 12/25/2022 Target Resolution Date: 07/14/2023 Goal Status: Active Ulcer/skin breakdown will have a volume reduction of 30% by week 4 Date Initiated: 12/25/2022 Date Inactivated: 01/27/2023 Target Resolution Date: 01/22/2023 Unmet Reason: no major changes; Goal Status: Unmet continue current treatment. Interventions: Assess patient/caregiver ability to obtain necessary  supplies Assess patient/caregiver ability to perform ulcer/skin care regimen upon admission and as needed Assess ulceration(s) every visit Provide education on smoking Provide education on ulcer and skin care Notes: Electronic Signature(s) Signed: 06/04/2023 6:03:32 PM By: Shawn Stall RN, BSN Entered By: Shawn Stall on 06/02/2023 08:15:27 -------------------------------------------------------------------------------- Pain Assessment Details Patient Name: Date of Service: [name], [name] 06/02/2023 10:30 A M Medical Record Number: [id number] Patient Account Number: [id number] Date of Birth/Sex: Treating RN: 02/14/1966 (57 y.o. [name] Primary Care Doriann Zuch: [name] Other Clinician: Referring Johnthomas Lader: Treating Sreekar Broyhill/Extender: Theodis Sato in Treatment: 22 Active Problems Location of Pain Severity and Description of Pain Patient Has Paino No Site Locations ORLAN, AVERSA ([id number]) [id number] Page 7 of 9 Pain Management and Medication Current Pain Management: Electronic Signature(s) Signed: 06/04/2023 6:03:32 PM By: Shawn Stall RN, BSN Entered By: Shawn Stall on 06/02/2023 08:00:49 -------------------------------------------------------------------------------- Patient/Caregiver Education Details Patient Name: Date of Service: [name], [name] 10/29/2024andnbsp10:30 A M Medical Record Number: [id number] Patient Account Number: [id number] Date of Birth/Gender: Treating RN: Nov 12, 1965 (57 y.o. [name] Primary Care Physician: [name] Other Clinician: Referring Physician: Treating Physician/Extender: Theodis Sato in Treatment: 22 Education Assessment Education Provided To: Patient Education Topics Provided Wound/Skin Impairment: Methods: Explain/Verbal Responses: Reinforcements needed, State content correctly [employer]) Signed:  06/04/2023 6:03:32 PM By: Shawn Stall RN, BSN Entered By: Shawn Stall on 06/02/2023 08:15:42 -------------------------------------------------------------------------------- Wound Assessment Details Patient Name: Date of Service: [name], [name] 06/02/2023 10:30 A M Medical Record Number: [id number] Patient Account Number: [id number] Date of Birth/Sex: Treating RN: 1965/11/06 (57 y.o. [name] Primary Care Tyniya Kuyper: [name] Other Clinician: Referring Makailey Hodgkin: Treating Aadam Zhen/Extender: Nelson, Noone, [name] ([id number]) [id number] Page 8 of 9 Weeks in Treatment: 22 Wound Status Wound Number: 1 Primary Diabetic Wound/Ulcer of the Lower Extremity Etiology: Wound Location: Left Achilles Secondary Arterial Insufficiency Ulcer Wounding Event: Gradually Appeared Etiology: Date Acquired: 12/07/2022 Wound Open Weeks Of Treatment: 22 Status: Clustered Wound: No Comorbid Cataracts, Coronary Artery Disease, Hypertension, Peripheral History: Venous Disease, Type I Diabetes, Rheumatoid Arthritis, Osteoarthritis Photos Wound Measurements Length: (cm) 1 Width: (cm) 0.9 Depth: (cm) 0.2 Area: (cm) 0.707 Volume: (cm) 0.141 % Reduction in Area: -40.6% % Reduction in Volume: -182% Epithelialization: Small (1-33%) Tunneling: No Undermining: No Wound Description Classification: Grade 2 Wound Margin: Distinct, outline attached Exudate Amount: Medium Exudate Type: Serous Exudate Color: amber Foul Odor After Cleansing: No Slough/Fibrino Yes Wound Bed Granulation Amount: None Present (0%) Exposed Structure Necrotic Amount: None Present (0%) Fascia Exposed: No Fat Layer (Subcutaneous Tissue) Exposed: Yes Tendon Exposed: No Muscle Exposed: No Joint Exposed: No Bone Exposed: No Periwound Skin Texture Texture Color No Abnormalities Noted: No No Abnormalities Noted: No Callus: No Atrophie Blanche:  No Crepitus: No Cyanosis: No Excoriation: No Ecchymosis: No Induration: No Erythema: No Rash: No Hemosiderin Staining: No Scarring: No Mottled: No Pallor: No Moisture Rubor: No No Abnormalities Noted:  No Dry / Scaly: No Maceration: No Assessment Notes unable to assess wound bed- measured the wound with adaptic in place and did not remove- cared for by another Belita Warsame. Treatment Notes Wound #1 (Achilles) Wound Laterality: Left Cleanser Peri-Wound Care OBERT, ESPINDOLA ([id number]) [id number] Page 9 of 9 Topical Primary Dressing Secondary Dressing Woven Gauze Sponge, Non-Sterile 4x4 in Discharge Instruction: Apply over primary dressing as directed. Secured With Elastic Bandage 4 inch (ACE bandage) Discharge Instruction: Secure with ACE bandage as directed. Kerlix Roll Sterile, 4.5x3.1 (in/yd) Discharge Instruction: Secure with Kerlix as directed. 66M Medipore H Soft Cloth Surgical T ape, 4 x 10 (in/yd) Discharge Instruction: Secure with tape as directed. Compression Wrap Compression Stockings Add-Ons Electronic Signature(s) Signed: 06/04/2023 6:03:32 PM By: Shawn Stall RN, BSN Entered By: Shawn Stall on 06/02/2023 08:04:38 -------------------------------------------------------------------------------- Vitals Details Patient Name: Date of Service: [name], [name] 06/02/2023 10:30 A M Medical Record Number: [id number] Patient Account Number: [id number] Date of Birth/Sex: Treating RN: [birth year] (56 y.o. M) Primary Care Marquest Gunkel: [name] Other Clinician: Referring Janea Schwenn: Treating Andreya Lacks/Extender: Theodis Sato in Treatment: 22 Vital Signs Time Taken: 10:18 Temperature (F): 97.8 Height (in): 68 Pulse (bpm): 71 Weight (lbs): 200 Respiratory Rate (breaths/min): 18 Body Mass Index (BMI): 30.4 Blood Pressure (mmHg): 141/66 Capillary Blood Glucose (mg/dl): 782 Reference Range: 80 - 120 mg /  dl Electronic Signature(s) Signed: 06/02/2023 10:53:00 AM By: [name] CHT EMT BS , , Entered By: [name] on 06/02/2023 07:53:00

## 2023-06-02 NOTE — Progress Notes (Signed)
[name], RACKHAM ([id number]) [id number] Page 1 of 2 Visit Report for 06/02/2023 HBO Details Patient Name: Date of Service: [place], [name] 06/02/2023 7:30 A M Medical Record Number: [id number] Patient Account Number: [id number] Date of Birth/Sex: Treating RN: [birth year] (57 y.o. [name] Primary Care Mariana Goytia: [name] Other Clinician: [name] Referring Diantha Paxson: Treating Ladiamond Gallina/Extender: Theodis Sato in Treatment: 22 HBO Treatment Course Details Treatment Course Number: 1 Ordering Conita Amenta: Geralyn Corwin T Treatments Ordered: otal 60 HBO Treatment Start Date: 02/16/2023 HBO Indication: Other (specify in Notes) Notes: Acute peripheral arterial insufficiency HBO Treatment Details Treatment Number: 60 Patient Type: Outpatient Chamber Type: Monoplace Chamber Serial #: [id number] Treatment Protocol: 2.0 ATA with 90 minutes oxygen, with two 5 minute air breaks Treatment Details Compression Rate Down: 1.5 psi / minute De-Compression Rate Up: 2.0 psi / minute A breaks and breathing ir Compress Tx Pressure periods Decompress Decompress Begins Reached (leave unused spaces Begins Ends blank) Chamber Pressure (ATA 1 2 2 2 2 2  --2 1 ) Clock Time (24 hr) 08:13 08:25 08:55 09:00 09:30 09:35 - - 10:05 10:15 Treatment Length: 122 (minutes) Treatment Segments: 4 Vital Signs Capillary Blood Glucose Reference Range: 80 - 120 mg / dl HBO Diabetic Blood Glucose Intervention Range: <131 mg/dl or >782 mg/dl Type: Time Vitals Blood Respiratory Capillary Blood Glucose Pulse Action Pulse: Temperature: Taken: Pressure: Rate: Glucose (mg/dl): Meter #: Oximetry (%) Taken: Pre 07:51 128/64 97 18 97.4 242 none per protocol Post 10:18 141/66 71 18 97.8 218 none per protocol Treatment Response Treatment Toleration: Well Treatment Completion Status: Treatment Completed without Adverse Event Treatment Notes Mr.  Mcgourty arrived with normal vital signs and described his breakfast and a small amount of coffee this morning. He prepared for treatment. After performing a safety check, he was placed in the chamber which was compressed with 100% oxygen at a rate of 2 psi/min after confirming normal ear equalization. He tolerated the treatment and subsequent decompression of the chamber at a rate of 2 psi/min. He denied any issues with ear equalization and/or pain caused by barotrauma. Post-treatment vital signs within normal range. He was stable upon discharge. Keanon Bevins Notes Patient's last thigh. He was also seen for wound care evaluation Physician HBO Attestation: I certify that I supervised this HBO treatment in accordance with Medicare guidelines. A trained emergency response team is readily available per Yes hospital policies and procedures. Continue HBOT as ordered. Yes Electronic Signature(s) Signed: 06/02/2023 5:13:18 PM By: [name] MD Previous Signature: 06/02/2023 11:53:43 AM Version By: [name] CHT EMT BS , , Entered By: [name] on 06/02/2023 17:10:28 [name] ([id number]) [id number] Page 2 of 2 -------------------------------------------------------------------------------- HBO Safety Checklist Details Patient Name: Date of Service: [name], [name] 06/02/2023 7:30 A M Medical Record Number: [id number] Patient Account Number: [id number] Date of Birth/Sex: Treating RN: June 24, 1966 (56 y.o. [name] Primary Care Phyillis Dascoli: [name] Other Clinician: [name] Referring Shaneta Cervenka: Treating Jonisha Kindig/Extender: Theodis Sato in Treatment: 22 HBO Safety Checklist Items Safety Checklist Consent Form Signed Patient voided / foley secured and emptied 0700 - [employer], Protein Shake, When did you last eato Coffee Last dose of injectable or oral agent 0630 - 17 units Lantus Ostomy  pouch emptied and vented if applicable NA All implantable devices assessed, documented and approved Freestyle Libre 2 and3 on approved list Intravenous access site secured and place NA Valuables secured Linens and cotton and cotton/polyester blend (less than 51% polyester)  Personal oil-based products / skin lotions / body lotions removed Wigs or hairpieces removed NA Smoking or tobacco materials removed NA Books / newspapers / magazines / loose paper removed Cologne, aftershave, perfume and deodorant removed Jewelry removed (may wrap wedding band) Make-up removed NA Hair care products removed Battery operated devices (external) removed Heating patches and chemical warmers removed Titanium eyewear removed Nail polish cured greater than 10 hours NA Casting material cured greater than 10 hours NA Hearing aids removed NA Loose dentures or partials removed NA Prosthetics have been removed NA Patient demonstrates correct use of air break device (if applicable) Patient concerns have been addressed Patient grounding bracelet on and cord attached to chamber Specifics for Inpatients (complete in addition to above) Medication sheet sent with patient NA Intravenous medications needed or due during therapy sent with patient NA Drainage tubes (e.g. nasogastric tube or chest tube secured and vented) NA Endotracheal or Tracheotomy tube secured NA Cuff deflated of air and inflated with saline NA Airway suctioned NA Notes Paper version used prior to treatment start. Electronic Signature(s) Signed: 06/02/2023 11:51:35 AM By: [name] CHT EMT BS , , Entered By: [name] on 06/02/2023 11:51:34

## 2023-06-02 NOTE — Progress Notes (Signed)
TARAS, UTT ([id number]) [id number] Page 1 of 2 Visit Report for 06/02/2023 Arrival Information Details Patient Name: Date of Service: [place], [name] 06/02/2023 7:30 A M Medical Record Number: [id number] Patient Account Number: [id number] Date of Birth/Sex: Treating RN: 1965/12/09 (57 y.o. [name] Primary Care Dorothee Napierkowski: [name] Other Clinician: [name] Referring Raynor Calcaterra: Treating Cay Kath/Extender: Theodis Sato in Treatment: 22 Visit Information History Since Last Visit All ordered tests and consults were completed: Yes Patient Arrived: Ambulatory Added or deleted any medications: No Arrival Time: 07:48 Any new allergies or adverse reactions: No Accompanied By: self Had a fall or experienced change in No Transfer Assistance: None activities of daily living that may affect Patient Identification Verified: Yes risk of falls: Secondary Verification Process Completed: Yes Signs or symptoms of abuse/neglect since last visito No Patient Requires Transmission-Based Precautions: No Hospitalized since last visit: No Patient Has Alerts: No Implantable device outside of the clinic excluding No cellular tissue based products placed in the center since last visit: Pain Present Now: No Electronic Signature(s) Signed: 06/02/2023 11:48:17 AM By: [name] CHT EMT BS , , Entered By: [name] on 06/02/2023 11:48:17 -------------------------------------------------------------------------------- Encounter Discharge Information Details Patient Name: Date of Service: [name], [name] 06/02/2023 7:30 A M Medical Record Number: [id number] Patient Account Number: [id number] Date of Birth/Sex: Treating RN: 1966-02-28 (57 y.o. [name] Primary Care Arthella Headings: [name] Other Clinician: [name] Referring Horice Carrero: Treating Ajmal Kathan/Extender: Theodis Sato in Treatment: 22 Encounter Discharge Information Items Discharge Condition: Stable Ambulatory Status: Ambulatory Discharge Destination: Other (Note Required) Transportation: Other Accompanied By: staff Schedule Follow-up Appointment: No Clinical Summary of Care: Notes Patient had wound care encounter after HBOT today. Electronic Signature(s) Signed: 06/02/2023 11:55:12 AM By: [name] CHT EMT BS , , Entered By: [name] on 06/02/2023 11:55:12 Chilton, [name] ([id number]) [id number] Page 2 of 2 -------------------------------------------------------------------------------- Vitals Details Patient Name: Date of Service: [place], [name] 06/02/2023 7:30 A M Medical Record Number: [id number] Patient Account Number: [id number] Date of Birth/Sex: Treating RN: 1965-10-14 (57 y.o. [name] Primary Care Trezure Cronk: [name] Other Clinician: Karl Bales Referring Kemauri Musa: Treating Braun Rocca/Extender: Theodis Sato in Treatment: 22 Vital Signs Time Taken: 07:51 Temperature (F): 97.4 Height (in): 68 Pulse (bpm): 97 Weight (lbs): 200 Respiratory Rate (breaths/min): 18 Body Mass Index (BMI): 30.4 Blood Pressure (mmHg): 128/64 Capillary Blood Glucose (mg/dl): 427 Reference Range: 80 - 120 mg / dl Electronic Signature(s) Signed: 06/02/2023 11:49:55 AM By: [name] CHT EMT BS , , Entered By: [name] on 06/02/2023 11:49:54

## 2023-06-02 NOTE — Progress Notes (Signed)
BURDELL, POMPER ([id number]) [id number] Page 1 of 1 Visit Report for 06/01/2023 SuperBill Details Patient Name: Date of Service: [place], [name] 06/01/2023 Medical Record Number: [id number] Patient Account Number: [id number] Date of Birth/Sex: Treating RN: 1966-05-11 (57 y.o. [name] Primary Care Provider: [name] Other Clinician: [name] Referring Provider: Treating Provider/Extender: Theodis Sato in Treatment: 22 Diagnosis Coding ICD-10 Codes Code Description [phone] Non-pressure chronic ulcer of other part of left foot with other specified severity I70.245 Atherosclerosis of native arteries of left leg with ulceration of other part of foot 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 Facility Procedures CPT4 Code Description Modifier Quantity [id number] G0277-(Facility Use Only) HBOT full body chamber, , 4 ICD-10 Diagnosis Description 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 Physician Procedures Quantity CPT4 Code Description Modifier [id number] - WC PHYS HYPERBARIC OXYGEN THERAPY 1 ICD-10 Diagnosis Description 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

## 2023-06-03 ENCOUNTER — Encounter (HOSPITAL_BASED_OUTPATIENT_CLINIC_OR_DEPARTMENT_OTHER): Payer: 59 | Admitting: Physician Assistant

## 2023-06-03 ENCOUNTER — Encounter: Payer: Self-pay | Admitting: Family

## 2023-06-03 ENCOUNTER — Ambulatory Visit (INDEPENDENT_AMBULATORY_CARE_PROVIDER_SITE_OTHER): Payer: 59 | Admitting: Family

## 2023-06-03 DIAGNOSIS — L97423 Non-pressure chronic ulcer of left heel and midfoot with necrosis of muscle: Secondary | ICD-10-CM

## 2023-06-03 MED ORDER — LEVOFLOXACIN 750 MG PO TABS: 750.0000 mg | ORAL_TABLET | Freq: Every day | ORAL | 0 refills | Status: DC

## 2023-06-03 NOTE — Progress Notes (Signed)
Post-Op Visit Note   Patient: [name]           Date of Birth: 03-May-1966           MRN: [id number] Visit Date: 06/03/2023 PCP: Garlan Fillers, MD  Chief Complaint:  Chief Complaint  Patient presents with   Left Achilles Tendon - Routine Post Op    03/04/2023 left achilles debridement     HPI:  HPI The patient is a 57 year old gentleman who is seen in follow-up status post left Achilles debridement on July 31 he has had Kerecis micro powder applied in the office, donated, weekly  He also continues with home exercise program as well as hyperbaric oxygen treatments, has quit smoking. Was seen by vascular 3 weeks ago. No new recs.   Ortho Exam On examination of the left Achilles area of the ulcer is 100% filled in with granulation there is good uptake of the Kerecis powder.  There is no undermining.  The measurements today are 1 centimeters by 0.5 cm in width there is no depth some mild surrounding erythema however appears to be healing circumferentially.  No erythema warmth or sign of infection  Donation Kerecis micro powder applied again today.  Visit Diagnoses: No diagnosis found.  Plan: cleansed with vashe. Kerecis micro powder donation reapplied today.  Will change overlying gauze daily as instructed daily for the first week he will follow-up in the office in 1 more week for reevaluation  Follow-Up Instructions: No follow-ups on file.   Imaging: No results found.  Orders:  No orders of the defined types were placed in this encounter.  Meds ordered this encounter  Medications   levofloxacin (LEVAQUIN) 750 MG tablet    Sig: Take 1 tablet (750 mg total) by mouth daily.    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 03/22/2020   Osteopenia of multiple sites 02/28/2020   PVD (peripheral vascular disease) (HCC) 10/11/2019   Chronic venous insufficiency 10/05/2018   Diabetic cataract (HCC) 05/21/2018   Controlled type 1 diabetes mellitus with diabetic peripheral angiopathy without gangrene (HCC) 08/14/2017   Dyslipidemia 03/26/2017   High risk medication use 09/23/2016   History of hypertension 09/23/2016   History of chronic kidney disease 09/23/2016   History of coronary artery disease 09/23/2016   Tobacco abuse 09/23/2016   Primary osteoarthritis of both knees 09/23/2016   History of diabetes mellitus 09/23/2016   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 (HCC) 11/20/2015   Chest pain with high risk for cardiac etiology 10/29/2015   Asymptomatic bilateral carotid artery stenosis 06/08/2014   Pleural effusion, bilateral 06/03/2014   Dyspnea 06/01/2014   Diabetes (HCC) 05/03/2014   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/03/2014   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   Chronic renal disease, stage 3, moderately decreased glomerular filtration rate between 30-59 mL/min/1.73 square meter (HCC) 05/03/2014   CAD (coronary artery disease) 04/27/2014   Acne 12/19/2013   On isotretinoin therapy 12/19/2013   Nuclear sclerotic cataract, bilateral 12/19/2011   Vitreous hemorrhage (HCC) 06/16/2011   Past Medical History:  Diagnosis Date   Anginal  pain (HCC)    Anxiety    Arthritis    RA IN HANDS   Asthma    CAD (coronary artery disease) 04/27/2014   Chronic renal disease, stage 3, moderately decreased glomerular filtration rate between 30-59 mL/min/1.73 square meter (HCC) 05/03/2014   COPD (chronic obstructive pulmonary disease) (HCC)    Coronary artery disease    Diabetes (HCC) 05/03/2014   Diabetes mellitus without complication (HCC)    type 1   Hyperlipidemia    Left  shoulder pain    Peripheral vascular disease (HCC)    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/03/2014   Shortness of breath    Sleep apnea    mild OSA, no CPAP   Unstable angina pectoris (HCC) 04/25/2014    Family History  Problem Relation Age of Onset   Stomach cancer Mother    Cancer Mother    Prostate cancer Father    Heart disease Father    Cancer - Prostate Father    Heart disease Brother    Asthma Daughter    Healthy Daughter    Asthma Daughter     Past Surgical History:  Procedure Laterality Date   ABDOMINAL AORTOGRAM W/LOWER EXTREMITY N/A 01/15/2023   Procedure: ABDOMINAL AORTOGRAM W/LOWER EXTREMITY;  Surgeon: Cephus Shelling, MD;  Location: [facility] INVASIVE CV LAB;  Service: Cardiovascular;  Laterality: N/A;   CARDIAC CATHETERIZATION  04/25/2014   BY DR Jacinto Halim   CARDIAC CATHETERIZATION N/A 10/30/2015   Procedure: Left Heart Cath and Cors/Grafts Angiography;  Surgeon: Yates Decamp, MD;  [facility] INVASIVE CV LAB;  Service: Cardiovascular;  Laterality: N/A;   CARPAL TUNNEL RELEASE     CATARACT EXTRACTION, BILATERAL     CORONARY ARTERY BYPASS GRAFT N/A 04/27/2014   Procedure: CORONARY ARTERY BYPASS GRAFTING on pump using left internal mammary artery to LAD coronary artery, right great saphenous vein graft to diagonal coronary artery with sequential to OM1 and circumflex coronary arteries. Right greater saphenous vein graft to posterior descending coronary artery. ;  Surgeon: Delight Ovens, MD;  Location: [facility] OR;  Service: Open Heart Surgery;  Laterality: N   elbow drained Left 05/09/2019   ENDOVEIN HARVEST OF GREATER SAPHENOUS VEIN Right 04/27/2014   Procedure: ENDOVEIN HARVEST OF GREATER SAPHENOUS VEIN;  Surgeon: Delight Ovens, MD;  Location: [facility] OR;  Service: Open Heart Surgery;  Laterality: Right;   EXCISION ORAL TUMOR N/A 03/01/2018   Procedure: EXCISION ORAL TUMOR;  Surgeon: Christia Reading, MD;  Location: [facility];  Service: ENT;   Laterality: N/A;   EYE SURGERY     LASER   EYE SURGERY Bilateral    astigmatism correction    HARDWARE REMOVAL Left 10/03/2020   Procedure: LEFT FOOT REMOVAL HARDWARE, PLACE VANC POWDER;  Surgeon: Nadara Mustard, MD;  Location: [facility] OR;  Service: Orthopedics;  Laterality: Left;   I & D EXTREMITY Left 03/04/2023   Procedure: LEFT ACHILLES DEBRIDEMENT;  Surgeon: Nadara Mustard, MD;  Location: [facility] OR;  Service: Orthopedics;  Laterality: Left;   INTRAOPERATIVE TRANSESOPHAGEAL ECHOCARDIOGRAM N/A 04/27/2014   Procedure: INTRAOPERATIVE TRANSESOPHAGEAL ECHOCARDIOGRAM;  Surgeon: Delight Ovens, MD;  Location: [facility] OR;  Service: Open Heart Surgery;  Laterality: N/A;   IR RADIOLOGIST EVAL & MGMT  12/22/2022   LEFT HEART CATHETERIZATION WITH CORONARY ANGIOGRAM N/A 04/25/2014   Procedure: LEFT HEART CATHETERIZATION WITH CORONARY ANGIOGRAM;  Surgeon: Pamella Pert, MD;  Location[facility] CATH LAB;  Service: Cardiovascular;  Laterality: N/A;   MOUTH SURGERY  11/15/2017   tongue surgery    ORIF TOE FRACTURE Left 03/22/2020   Procedure: OPEN REDUCTION INTERNAL FIXATION (ORIF) Non Union 5th Metatarsal;  Surgeon: Kathryne Hitch, MD;  Location: [facility];  Service: Orthopedics;  Laterality: Left;   Social History   Occupational History   Not on file  Tobacco Use   Smoking status: Former    Current packs/day: 0.25    Average packs/day: 0.3 packs/day for 34.0 years (8.5 ttl pk-yrs)    Types: Cigarettes   Smokeless tobacco: Never  Vaping Use   Vaping status: Never Used  Substance and Sexual Activity   Alcohol use: Not Currently    Comment: RARE   Drug use: No   Sexual activity: Not on file

## 2023-06-03 NOTE — Progress Notes (Signed)
RAEDYN, CENTEIO ([id number]) [id number] Page 1 of 1 Visit Report for 06/02/2023 SuperBill Details Patient Name: Date of Service: [name], [name] 06/02/2023 Medical Record Number: [id number] Patient Account Number: [id number] Date of Birth/Sex: Treating RN: 1965-08-16 (57 y.o. [name] Primary Care Provider: [name] Other Clinician: [name] Referring Provider: Treating Provider/Extender: Theodis Sato in Treatment: 22 Diagnosis Coding ICD-10 Codes Code Description [phone] Non-pressure chronic ulcer of other part of left foot with other specified severity I70.245 Atherosclerosis of native arteries of left leg with ulceration of other part of foot 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 Facility Procedures CPT4 Code Description Modifier Quantity [id number] G0277-(Facility Use Only) HBOT full body chamber, , 4 ICD-10 Diagnosis Description 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 Type 1 diabetes mellitus with foot ulcer Physician Procedures Quantity CPT4 Code Description Modifier [id number] 99183 - WC PHYS HYPERBARIC OXYGEN THERAPY 1 ICD-10 Diagnosis Description 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

## 2023-06-04 ENCOUNTER — Encounter (HOSPITAL_BASED_OUTPATIENT_CLINIC_OR_DEPARTMENT_OTHER): Payer: 59 | Admitting: Internal Medicine

## 2023-06-05 ENCOUNTER — Encounter (HOSPITAL_BASED_OUTPATIENT_CLINIC_OR_DEPARTMENT_OTHER): Payer: 59 | Admitting: General Surgery

## 2023-06-05 NOTE — Progress Notes (Signed)
[name], [name] ([id number]) [id number] Page 1 of 7 Visit Report for 06/02/2023 HPI Details Patient Name: Date of Service: [name], [name] 06/02/2023 10:30 A M Medical Record Number: [id number] Patient Account Number: [id number] Date of Birth/Sex: Treating RN: April 30, 1966 (57 y.o. M) Primary Care Provider: Jarome [name] Other Clinician: Referring Provider: Treating Provider/Extender: [name] in Treatment: 22 History of Present Illness HPI Description: 12/25/2022 [name] is a 57 year old male with a past medical history of controlled insulin-dependent type 1 diabetes, COPD and rheumatoid arthritis that presents the clinic for a 1 month history of nonhealing ulcer to the left foot. On 03/2020 patient had surgery for left fifth metatarsal fracture and subsequently had hardware removal in March 2022 due to infection. Over the past year he has experienced pain to the lateral left foot however the incision site and wound has healed. 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 He follows with [name] for this issue. Due to pain to the lateral left foot he uses Hoka sneakers however this rubbed at the back of his heel creating a wound to the Achilles region. He has since changed his shoe wear to boots that come up above this area or crocs. He has been able to offload this area over the past couple weeks. He is currently on doxycycline, pentoxifylline, nitroglycerin patch and cilostazol by [name] for his wound care. Currently patient denies signs of infection. ABI in office was 1.3 and he is scheduled for arterial duplex studies on 01/28/23. 5/30; patient presents for follow-up. He has been using Santyl to the wound bed. He had his ABIs completed that showed noncompressible on the left with TBI of 0.35. Currently denies signs of infection. 6/10;  patient presents for follow-up. He has been using Santyl to the wound bed. He had been referred to vein and vascular at our last clinic visit however he cannot be seen until 6/25. He is concerned about the wound. We will call today to see if we can facilitate an earlier appointment. He denies signs of infection. He has a 40 pack smoking history and currently smokes about half a pack a day. 6/21; patient presents for follow-up. 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 He feels a plantar arch with very brisk flow into the foot. There is some disease in the distal anterior tibial that did not appear flow- limiting. No intervention necessary. It was thought that he had enough blood flow for healing. He continues to use Santyl. 6/25; patient presents for follow-up. He had TCOM exam last Friday with probes located above, below and each side of the wound bed. T the superior probe is o reading was 68 mmHg, post O2 176, the lateral probe reading was 60 mmHg, post O2 129, the medial probe reading was 70 mmHg, post o2 176, the inferior probe reading was 182 mmHg. His primary care physician also ordered arterial segmental studies that are currently completed tomorrow. He has been using Santyl to the wound bed. He does have more granulation tissue present today. 7/1; patient presents for follow-up. He has been approved for hyperbaric oxygen therapy by his insurance. He has been using Santyl to the wound bed. He has no issues or complaints today. 7/9; patient presents for follow-up. Patient has had cardiac clearance by his cardiologist to start  hyperbaric oxygen therapy. We will start him Monday next week. He has been using Santyl to the wound bed. He has no issues or complaints today. 7/15; patient presents for follow-up. He started hyperbaric oxygen therapy for his right posterior heel wound secondary to  arterial insufficiency and type 2 diabetes. He tolerated the treatment well today. He has been using Santyl to the wound bed. He has no issues or complaints today. 7/19; Patient asked to be seen to discuss potential plans for surgery. T the wound bed he has been using Santyl. He denies signs of infection. Wound now o has minimal tendon exposed. 7/23; patient presents for follow-up. He has been using Santyl to the wound bed. Wound is slightly smaller today. He has no issues or complaints. He denies signs of infection. 7/29; patient presents for follow-up. Has been using Santyl to the wound bed. Plan is for OR debridement with skin substitute placement and wound VAC on 7/31. Patient is completing hyperbaric oxygen therapy without issues. 8/15; patient presents for follow-up. He has been completing hyperbaric oxygen therapy without issues. He has completed 20 out of 30 sessions. On 03/04/2023 he had debridement of the left Achilles by orthopedic surgery with placement of Kerecis. He states he had a wound VAC on for 1 week. He has been following Regularly with orthopedic and having Kerecis placed in the office. During the surgery he had cultures taken that grew central stenotrophomonas sensitive to Bactrim and Levaquin. He has completed a course of Bactrim and is now on Levaquin. He denies signs of infection. 8/29; patient presents for follow-up. He continues to follow with orthopedic surgery for his wound care. He states he had Kerecis placed in office on 8/27. We will not disrupt this and no dressings were taken off today. Pictures were reviewed from the chart (from 8/27) and wound appears well-healing. He has almost completed his 30 approved treatments for HBO in the setting of arterial insufficiency. He would like to continue this. He currently denies signs of infection. 9/12; patient presents for follow-up. He has been approved for 30 more sessions of hyperbaric oxygen therapy for arterial  insufficiency with nonhealing wound to the left Achilles region. He started the second round earlier this week. He has been tolerating hyperbaric oxygen therapy well. He is following with orthopedic surgery for his wound care. At his last appointment 2 days ago he had Kerecis placed in office. The wound has healthy granulation tissue present and appears well-healing. Patient is pleased with the progress. He has no issues or complaints today. He denies signs of infection. 9/26; this is a gentleman who is a type I diabetic with known significant PAD. By his description he has occlusion of the posterior tibial artery and his wound is on the left Achilles. We are doing hyperbaric oxygen and T com show an improvement in the oxygenation of this area both out of chamber and in chamber. He is now diving in the 30+ range second 30 dive approval. We are not following his wound which is being dressed at Dr. Audrie Lia office with Darlen Round powder kerlix and a wrap. They have been doing this every week. We did not look at the wound today but superficially on the pictures things look a lot better. Apparently this was a much larger wound when it was debrided surgically in late July. [name], [name] ([id number]) [id number] Page 2 of 7 9/30; patient also seen in follow-up today in conjunction with HBO. He is on diet 45 out of 60.  Known significant PAD which was apparently not amenable to revascularization. Noteworthy his posterior tibial on the left is occluded this would No Doubt be the area that supplies the wound on the Achilles tendon. We undressed the wound for the first time in a long time. This does not look quite as good as the picture the patient has on his phone that I looked at last week. I think he is somewhat upset by this. They apparently applied Kerecis powder to this area last time but gave the permission to the patient shower with this I am not clear that he has really been  dressing it since then. 10/14; patient is nearing the end of the course of his HBO treatment. He has a wound on the posterior Achilles area on the left. He is also been followed by orthopedics Dr. Audrie Lia office. They have been applying weekly Kerecis powder and they did this last week. The patient is able to show me pictures and he has a clean granulated wound however raised edges around the circumference are also present. He is not complaining of any pain. He is having no problems in HBO 10/29; the patient is completed 60 treatments of hyperbaric oxygen which she tolerated well. He is a diabetic with significant PAD. His recent arterial duplex showed a 50 to 74% stenosis in the left common femoral artery, 50 to 74% stenosis in the deep femoral artery and a lesser degree of stenosis in the superficial femoral artery. 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 His wound is on the left Achilles area. He has been getting weekly Kerecis at orthopedics. We have not really been following this wound. 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 06/02/2023 10:30 A M Medical Record Number: [id number] Patient Account Number: [id number] Date of Birth/Sex: Treating RN: September 11, 1965 (57 y.o. M) Primary Care Provider: Jarome [name] Other Clinician: Referring Provider: Treating Provider/Extender: [name] in Treatment: 22 Constitutional Sitting or standing Blood Pressure is within target range for patient.. Pulse regular and within target range for patient.Marland Kitchen Respirations regular, non-labored and within target range.. Temperature is  normal and within the target range for the patient.Marland Kitchen Appears in no distress. Notes Wound exam; eschared small wound. This likely needs debridement. Otherwise we did not disturb this. Kerecis will be applied tomorrow. Electronic Signature(s) Signed: 06/02/2023 5:13:18 PM By: [name] MD Entered By: [name] on 06/02/2023 08:21:53 -------------------------------------------------------------------------------- Physician Orders Details Patient Name: Date of Service: [name], [name] 06/02/2023 10:30 A M Medical Record Number: [id number] Patient Account Number: [id number] Date of Birth/Sex: Treating RN: October 24, 1965 (57 y.o. [name] Primary Care Provider: Jarome [name] Other Clinician: Referring Provider: Treating Provider/Extender: [name] in Treatment: 22 Verbal / Phone Orders: No Diagnosis Coding Follow-up Appointments Return appointment in 3 weeks. - w/ Dr. Leanord Hawking any day Other: - Continue to follow up with [name] for wound care. Cellular or Tissue Based Products Cellular or Tissue Based Product Type: - [name] covering wound care- Skin sub Haig Prophet) placed 04/22/23 placed at Dr. Audrie Lia office [name] ([id number]) [id number] Page 3 of 7 Off-Loading Other: - ensure no pressure from shoes are rubbing the wound. Additional Orders / Instructions Stop/Decrease Smoking - Congratulations on stopping  smoking!!! Follow Nutritious Diet - increase protein and closely monitor diabetes Other: - ALREADY DONE -Surgery with Dr Lajoyce [name] 03/04/23. Patient will follow with Dr Lajoyce [name] for wound care. Continue HBO. Juven Shake 1-2 times daily. 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 If appropriate for treatment, begin HBOT per protocol: 2.0 ATA for 90 Minutes with 2 Five (5) Minute A Breaks ir Total Number of Treatments: -  30. Extended to 60 (total).- Improved TCOMM, insurance approved extension of HBO. One treatments per day (delivered Monday through Friday unless otherwise specified in Special Instructions below): Finger stick Blood Glucose Pre- and Post- HBOT Treatment. Follow Hyperbaric Oxygen Glycemia Protocol Give two 4oz orange juices in addition to Glucerna when the glycemic protocol is used. A frin (Oxymetazoline HCL) 0.05% nasal spray - 1 spray in both nostrils daily as needed prior to HBO treatment for difficulty clearing ears Other - TCOMM in chamber to be perform in one of the hyperbaric treatment on Tuesday 04/07/2023. If improved noted by [facility] will ask insurance for approval of extension of treatments. Wound Treatment Wound #1 - Achilles Wound Laterality: Left Secondary Dressing: Woven Gauze Sponge, Non-Sterile 4x4 in Other:Sees [name] for wound care/30 Days Discharge Instructions: Apply over primary dressing as directed. Secured With: Elastic Bandage 4 inch (ACE bandage) Other:Sees [name] for wound care/30 Days Discharge Instructions: Secure with ACE bandage as directed. Secured With: [employer], 4.5x3.1 (in/yd) Other:Sees [name] for wound care/30 Days Discharge Instructions: Secure with Kerlix as directed. Secured With: 76M Medipore H Soft Cloth Surgical T ape, 4 x 10 (in/yd) Other:Sees [name] for wound care/30 Days Discharge Instructions: Secure with tape as directed. GLYCEMIA INTERVENTIONS PROTOCOL PRE-HBO GLYCEMIA INTERVENTIONS ACTION INTERVENTION Obtain pre-HBO capillary blood glucose (ensure 1 physician order is in chart). A. Notify HBO physician and await physician orders. 2 If result is 70 mg/dl or below: B. If the result meets the hospital definition of a critical result, follow hospital policy. A. Give patient an 8 ounce Glucerna Shake, an 8 ounce Ensure, or 8 ounces of a Glucerna/Ensure equivalent dietary supplement*. B. Wait 30 minutes. If result is 71 mg/dl  to 130 mg/dl: C. Retest patients capillary blood glucose (CBG). D. If result greater than or equal to 110 mg/dl, proceed with HBO. If result less than 110 mg/dl, notify HBO physician and consider holding HBO. If result is 131 mg/dl to 865 mg/dl: A. Proceed with HBO. A. Notify HBO physician and await physician orders. B. It is recommended to hold HBO and do If result is 250 mg/dl or greater: blood/urine ketone testing. C. If the result meets the hospital definition of a critical result, follow hospital policy. POST-HBO GLYCEMIA INTERVENTIONS ACTION INTERVENTION Obtain post HBO capillary blood glucose (ensure 1 physician order is in chart). A. Notify HBO physician and await physician orders. 2 If result is 70 mg/dl or below: B. If the result meets the hospital definition of a critical result, follow hospital policy. A. Give patient an 8 ounce Glucerna Shake, an 8 ounce Ensure, or 8 ounces of a Glucerna/Ensure equivalent dietary supplement*. B. Wait 15 minutes for symptoms of If result is 71 mg/dl to 784 mg/dl: hypoglycemia (i.e. nervousness, anxiety, sweating, chills, clamminess, irritability, confusion, tachycardia or dizziness). C. If patient asymptomatic, discharge patient. [name], [name] ([id number]) [id number] Page 4 of 7 If patient symptomatic, repeat capillary blood glucose (CBG) and notify HBO physician. If result is 101 mg/dl to 132  mg/dl: A. Discharge patient. A. Notify HBO physician and await physician orders. B. It is recommended to do blood/urine ketone If result is 250 mg/dl or greater: testing. C. If the result meets the hospital definition of a critical result, follow hospital policy. *Juice or candies are NOT equivalent products. If patient refuses the Glucerna or Ensure, please consult the hospital dietitian for an appropriate substitute. Electronic Signature(s) Signed: 06/02/2023 5:13:18 PM By: [name] MD Signed:  06/04/2023 6:03:32 PM By: Shawn Stall RN, BSN Entered By: Shawn Stall on 06/02/2023 08:17:22 -------------------------------------------------------------------------------- Problem List Details Patient Name: Date of Service: [name], [name] 06/02/2023 10:30 A M Medical Record Number: [id number] Patient Account Number: [id number] Date of Birth/Sex: Treating RN: 1965/12/18 (57 y.o. M) Primary Care Provider: Jarome [name] Other Clinician: Referring Provider: Treating Provider/Extender: [name] in Treatment: 22 Active Problems ICD-10 Encounter Code Description Active Date MDM Diagnosis L97.528 Non-pressure chronic ulcer of other part of left foot with other specified 12/25/2022 No Yes severity I70.245 Atherosclerosis of native arteries of left leg with ulceration of other part of 01/27/2023 No Yes foot E10.621 Type 1 diabetes mellitus with foot ulcer 12/25/2022 No Yes Inactive Problems ICD-10 Code Description Active Date Inactive Date J44.9 Chronic obstructive pulmonary disease, unspecified 12/25/2022 12/25/2022 M06.9 Rheumatoid arthritis, unspecified 12/25/2022 12/25/2022 B28.413 Personal history of nicotine dependence 01/27/2023 01/27/2023 Resolved Problems Electronic Signature(s) Signed: 06/02/2023 5:13:18 PM By: [name] MD Entered By: [name] on 06/02/2023 08:19:01 [name] ([id number]) [phone] Page 5 of 7 -------------------------------------------------------------------------------- Progress Note Details Patient Name: Date of Service: [name], [name] 06/02/2023 10:30 A M Medical Record Number: [id number] Patient Account Number: [id number] Date of Birth/Sex: Treating RN: 22-Feb-1966 (57 y.o. M) Primary Care Provider: Jarome [name] Other Clinician: Referring Provider: Treating Provider/Extender: [name] in Treatment: 22 Subjective History of  Present Illness (HPI) 12/25/2022 Mr. Messiah Ahr is a 57 year old male with a past medical history of controlled insulin-dependent type 1 diabetes, COPD and rheumatoid arthritis that presents the clinic for a 1 month history of nonhealing ulcer to the left foot. On 03/2020 patient had surgery for left fifth metatarsal fracture and subsequently had hardware removal in March 2022 due to infection. Over the past year he has experienced pain to the lateral left foot however the incision site and wound has healed. 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 He follows with [name] for this issue. Due to pain to the lateral left foot he uses Hoka sneakers however this rubbed at the back of his heel creating a wound to the Achilles region. He has since changed his shoe wear to boots that come up above this area or crocs. He has been able to offload this area over the past couple weeks. He is currently on doxycycline, pentoxifylline, nitroglycerin patch and cilostazol by [name] for his wound care. Currently patient denies signs of infection. ABI in office was 1.3 and he is scheduled for arterial duplex studies on 01/28/23. 5/30; patient presents for follow-up. He has been using Santyl to the wound bed. He had his ABIs completed that showed noncompressible on the left with TBI of 0.35. Currently denies signs of infection. 6/10; patient presents for follow-up. He has been using Santyl to the wound bed. He had been referred to vein and vascular at our last clinic visit however he cannot be seen until 6/25. He is concerned about the  wound. We will call today to see if we can facilitate an earlier appointment. He denies signs of infection. He has a 40 pack smoking history and currently smokes about half a pack a day. 6/21; patient presents for follow-up. He had a left lower extremity arteriogram on 01/15/2023 that showed the posterior  tibial is occluded throughout its course and does not reconstitute. He feels a plantar arch with very brisk flow into the foot. There is some disease in the distal anterior tibial that did not appear flow- limiting. No intervention necessary. It was thought that he had enough blood flow for healing. He continues to use Santyl. 6/25; patient presents for follow-up. He had TCOM exam last Friday with probes located above, below and each side of the wound bed. T the superior probe is o reading was 68 mmHg, post O2 176, the lateral probe reading was 60 mmHg, post O2 129, the medial probe reading was 70 mmHg, post o2 176, the inferior probe reading was 182 mmHg. His primary care physician also ordered arterial segmental studies that are currently completed tomorrow. He has been using Santyl to the wound bed. He does have more granulation tissue present today. 7/1; patient presents for follow-up. He has been approved for hyperbaric oxygen therapy by his insurance. He has been using Santyl to the wound bed. He has no issues or complaints today. 7/9; patient presents for follow-up. Patient has had cardiac clearance by his cardiologist to start hyperbaric oxygen therapy. We will start him Monday next week. He has been using Santyl to the wound bed. He has no issues or complaints today. 7/15; patient presents for follow-up. He started hyperbaric oxygen therapy for his right posterior heel wound secondary to arterial insufficiency and type 2 diabetes. He tolerated the treatment well today. He has been using Santyl to the wound bed. He has no issues or complaints today. 7/19; Patient asked to be seen to discuss potential plans for surgery. T the wound bed he has been using Santyl. He denies signs of infection. Wound now o has minimal tendon exposed. 7/23; patient presents for follow-up. He has been using Santyl to the wound bed. Wound is slightly smaller today. He has no issues or complaints. He denies signs of  infection. 7/29; patient presents for follow-up. Has been using Santyl to the wound bed. Plan is for OR debridement with skin substitute placement and wound VAC on 7/31. Patient is completing hyperbaric oxygen therapy without issues. 8/15; patient presents for follow-up. He has been completing hyperbaric oxygen therapy without issues. He has completed 20 out of 30 sessions. On 03/04/2023 he had debridement of the left Achilles by orthopedic surgery with placement of Kerecis. He states he had a wound VAC on for 1 week. He has been following Regularly with orthopedic and having Kerecis placed in the office. During the surgery he had cultures taken that grew central stenotrophomonas sensitive to Bactrim and Levaquin. He has completed a course of Bactrim and is now on Levaquin. He denies signs of infection. 8/29; patient presents for follow-up. He continues to follow with orthopedic surgery for his wound care. He states he had Kerecis placed in office on 8/27. We will not disrupt this and no dressings were taken off today. Pictures were reviewed from the chart (from 8/27) and wound appears well-healing. He has almost completed his 30 approved treatments for HBO in the setting of arterial insufficiency. He would like to continue this. He currently denies signs of infection. 9/12; patient presents for  follow-up. He has been approved for 30 more sessions of hyperbaric oxygen therapy for arterial insufficiency with nonhealing wound to the left Achilles region. He started the second round earlier this week. He has been tolerating hyperbaric oxygen therapy well. He is following with orthopedic surgery for his wound care. At his last appointment 2 days ago he had Kerecis placed in office. The wound has healthy granulation tissue present and appears well-healing. Patient is pleased with the progress. He has no issues or complaints today. He denies signs of infection. 9/26; this is a gentleman who is a type I  diabetic with known significant PAD. By his description he has occlusion of the posterior tibial artery and his wound is on the left Achilles. We are doing hyperbaric oxygen and T com show an improvement in the oxygenation of this area both out of chamber and in chamber. He is now diving in the 30+ range second 30 dive approval. We are not following his wound which is being dressed at Dr. Audrie Lia office with Darlen Round powder kerlix and a wrap. They have been doing this every week. We did not look at the wound today but superficially on the pictures things look a lot better. Apparently this was a much larger wound when it was debrided surgically in late July. 9/30; patient also seen in follow-up today in conjunction with HBO. He is on diet 45 out of 60. Known significant PAD which was apparently not amenable to revascularization. Noteworthy his posterior tibial on the left is occluded this would No Doubt be the area that supplies the wound on the Achilles tendon. [name], [name] ([id number]) [id number] Page 6 of 7 We undressed the wound for the first time in a long time. This does not look quite as good as the picture the patient has on his phone that I looked at last week. I think he is somewhat upset by this. They apparently applied Kerecis powder to this area last time but gave the permission to the patient shower with this I am not clear that he has really been dressing it since then. 10/14; patient is nearing the end of the course of his HBO treatment. He has a wound on the posterior Achilles area on the left. He is also been followed by orthopedics Dr. Audrie Lia office. They have been applying weekly Kerecis powder and they did this last week. The patient is able to show me pictures and he has a clean granulated wound however raised edges around the circumference are also present. He is not complaining of any pain. He is having no problems in HBO 10/29; the patient is  completed 60 treatments of hyperbaric oxygen which she tolerated well. He is a diabetic with significant PAD. His recent arterial duplex showed a 50 to 74% stenosis in the left common femoral artery, 50 to 74% stenosis in the deep femoral artery and a lesser degree of stenosis in the superficial femoral artery. 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 His wound is on the left Achilles area. He has been getting weekly Kerecis at orthopedics. We have not really been following this wound. He is going to the orthopedic office tomorrow where he thinks he will get his last application of Kerecis Objective Constitutional Sitting or standing Blood Pressure is within target range for patient.. Pulse regular and within target range for patient.Marland Kitchen Respirations regular, non-labored and within target range.. Temperature is normal and within the target range for the patient.Marland Kitchen Appears in no distress. Vitals Time  Taken: 10:18 AM, Height: 68 in, Weight: 200 lbs, BMI: 30.4, Temperature: 97.8 F, Pulse: 71 bpm, Respiratory Rate: 18 breaths/min, Blood Pressure: 141/66 mmHg, Capillary Blood Glucose: 218 mg/dl. General Notes: Wound exam; eschared small wound. This likely needs debridement. Otherwise we did not disturb this. Kerecis will be applied tomorrow. Integumentary (Hair, Skin) Wound #1 status is Open. Original cause of wound was Gradually Appeared. The date acquired was: 12/07/2022. The wound has been in treatment 22 weeks. The wound is located on the Left Achilles. The wound measures 1cm length x 0.9cm width x 0.2cm depth; 0.707cm^2 area and 0.141cm^3 volume. There is Fat Layer (Subcutaneous Tissue) exposed. There is no tunneling or undermining noted. There is a medium amount of serous drainage noted. The wound margin is distinct with the outline attached to the wound base. There is no granulation within the wound bed. There is no necrotic tissue within the wound bed. The periwound skin appearance did not  exhibit: Callus, Crepitus, Excoriation, Induration, Rash, Scarring, Dry/Scaly, Maceration, Atrophie Blanche, Cyanosis, Ecchymosis, Hemosiderin Staining, Mottled, Pallor, Rubor, Erythema. General Notes: unable to assess wound bed- measured the wound with adaptic in place and did not remove- cared for by another provider. 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 - w/ Dr. Leanord Hawking any day Other: - Continue to follow up with [name] for wound care. Cellular or Tissue Based Products: Cellular or Tissue Based Product Type: - [name] covering wound care- Skin sub Haig Prophet) placed 04/22/23 placed at Dr. Audrie Lia office Off-Loading: Other: - ensure no pressure from shoes are rubbing the wound. Additional Orders / Instructions: Stop/Decrease Smoking - Congratulations on stopping smoking!!! Follow Nutritious Diet - increase protein and closely monitor diabetes Other: - ALREADY DONE -Surgery with Dr Lajoyce [name] 03/04/23. Patient will follow with Dr Lajoyce [name] for wound care. Continue HBO. Juven Shake 1-2 times daily. Hyperbaric Oxygen Therapy: Wound #1 Left Achilles: Evaluate for HBO Therapy Indication: - arterial ulcer to left achilles wound. If appropriate for treatment, begin HBOT per protocol: 2.0 ATA for 90 Minutes with 2 Five (5) Minute Air Breaks T Number of Treatments: - 30. Extended to 60 (total).- Improved TCOMM, insurance approved extension of HBO. otal One treatments per day (delivered Monday through Friday unless otherwise specified in Special Instructions below): Finger stick Blood Glucose Pre- and Post- HBOT Treatment. Follow Hyperbaric Oxygen Glycemia Protocol Give two 4oz orange juices in addition to Glucerna when the glycemic protocol  is used. [name], [name] ([id number]) [id number] Page 7 of 7 Afrin (Oxymetazoline HCL) 0.05% nasal spray - 1 spray in both nostrils daily as needed prior to HBO treatment for difficulty clearing ears Other - TCOMM in chamber to be perform in one of the hyperbaric treatment on Tuesday 04/07/2023. If improved noted by [facility] will ask insurance for approval of extension of treatments. WOUND #1: - Achilles Wound Laterality: Left Secondary Dressing: Woven Gauze Sponge, Non-Sterile 4x4 in Other:Sees [name] for wound care/30 Days Discharge Instructions: Apply over primary dressing as directed. Secured With: Elastic Bandage 4 inch (ACE bandage) Other:Sees [name] for wound care/30 Days Discharge Instructions: Secure with ACE bandage as directed. Secured With: [employer], 4.5x3.1 (in/yd) Other:Sees [name] for wound care/30 Days Discharge Instructions: Secure with Kerlix as directed. Secured With: 56M Medipore H Soft  Cloth Surgical T ape, 4 x 10 (in/yd) Other:Sees [name] for wound care/30 Days Discharge Instructions: Secure with tape as directed. 1. The patient is telling me that he may come here for [place] to dress the wound. He thinks that tomorrow's application of Kerecis powder will be the last 2. He has completed HBO. I do not have a good sense of the wound response as we have the really not been following this. Electronic Signature(s) Signed: 06/02/2023 5:13:18 PM By: [name] MD Entered By: [name] on 06/02/2023 08:24:20 -------------------------------------------------------------------------------- SuperBill Details Patient Name: Date of Service: [name], [name] 06/02/2023 Medical Record Number: [id number] Patient Account Number: [id number] Date of Birth/Sex: Treating RN: 1966/02/10 (57 y.o. 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 Facility Procedures : CPT4 Code: [id number] Description: 99213 - WOUND CARE VISIT-LEV 3 EST PT Modifier: Quantity: 1 Physician Procedures : CPT4 Code Description Modifier [id number] - WC PHYS LEVEL 2 - EST PT ICD-10 Diagnosis Description L97.528 Non-pressure chronic ulcer of other part of left foot with other specified severity I70.245 Atherosclerosis of native arteries of left leg with  ulceration of other part of foot E10.621 Type 1 diabetes mellitus with foot ulcer Quantity: 1 Electronic Signature(s) Signed: 06/02/2023 5:13:18 PM By: [name] MD Entered By: [name] on 06/02/2023 08:24:45

## 2023-06-09 ENCOUNTER — Ambulatory Visit: Payer: 59 | Admitting: Vascular Surgery

## 2023-06-10 ENCOUNTER — Encounter: Payer: Self-pay | Admitting: Family

## 2023-06-10 ENCOUNTER — Telehealth: Payer: Self-pay | Admitting: Family

## 2023-06-10 ENCOUNTER — Ambulatory Visit (INDEPENDENT_AMBULATORY_CARE_PROVIDER_SITE_OTHER): Payer: 59 | Admitting: Family

## 2023-06-10 DIAGNOSIS — L97423 Non-pressure chronic ulcer of left heel and midfoot with necrosis of muscle: Secondary | ICD-10-CM | POA: Diagnosis not present

## 2023-06-10 MED ORDER — DOXYCYCLINE HYCLATE 100 MG PO TABS: 100.0000 mg | ORAL_TABLET | Freq: Two times a day (BID) | ORAL | 0 refills | Status: DC

## 2023-06-10 NOTE — Telephone Encounter (Signed)
  Checking with Denny Peon, her note from today doesn't state an abx needed.

## 2023-06-10 NOTE — Progress Notes (Signed)
Post-Op Visit Note   Patient: [name]           Date of Birth: Jul 14, 1966           MRN: [id number] Visit Date: 06/10/2023 PCP: Garlan Fillers, MD  Chief Complaint:  Chief Complaint  Patient presents with   Left Ankle - Follow-up    03/04/2023 left achilles debridement     HPI:  HPI The patient is a 57 year old gentleman who is seen in follow-up status post left Achilles debridement on July 31 he has had Kerecis micro powder applied in the office, donated, weekly  He also continues with home exercise program as well as hyperbaric oxygen treatments, has quit smoking.     Ortho Exam On examination of the left Achilles area of the ulcer is 100% filled in with granulation there is good uptake of the Kerecis powder.  There is no undermining.  The measurements today are 0.9 centimeters by 0.5 cm in width there is no depth some mild surrounding erythema however appears to be healing. Is stable.  No erythema warmth.  Donation Kerecis micro powder applied again today.  Visit Diagnoses: No diagnosis found.  Plan: cleansed with vashe. Kerecis micro powder donation reapplied today.  Will change overlying gauze daily as instructed daily for the first week he will follow-up in the office in 1 more week for reevaluation  Follow-Up Instructions: No follow-ups on file.   Imaging: No results found.  Orders:  No orders of the defined types were placed in this encounter.  No orders of the defined types were placed in this encounter.    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 03/22/2020   Osteopenia of multiple sites 02/28/2020   PVD (peripheral vascular disease) (HCC) 10/11/2019   Chronic venous insufficiency 10/05/2018   Diabetic  cataract (HCC) 05/21/2018   Controlled type 1 diabetes mellitus with diabetic peripheral angiopathy without gangrene (HCC) 08/14/2017   Dyslipidemia 03/26/2017   High risk medication use 09/23/2016   History of hypertension 09/23/2016   History of chronic kidney disease 09/23/2016   History of coronary artery disease 09/23/2016   Tobacco abuse 09/23/2016   Primary osteoarthritis of both knees 09/23/2016   History of diabetes mellitus 09/23/2016   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 (HCC) 11/20/2015   Chest pain with high risk for cardiac etiology 10/29/2015   Asymptomatic bilateral carotid artery stenosis 06/08/2014   Pleural effusion, bilateral 06/03/2014   Dyspnea 06/01/2014   Diabetes (HCC) 05/03/2014   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/03/2014   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   Chronic renal disease, stage 3, moderately decreased glomerular filtration rate between 30-59 mL/min/1.73 square meter (HCC) 05/03/2014   CAD (coronary artery disease) 04/27/2014   Acne 12/19/2013   On isotretinoin therapy 12/19/2013   Nuclear sclerotic cataract, bilateral 12/19/2011   Vitreous hemorrhage (HCC) 06/16/2011   Past Medical History:  Diagnosis Date   Anginal pain (HCC)    Anxiety    Arthritis    RA IN HANDS   Asthma    CAD (coronary artery disease) 04/27/2014   Chronic renal disease, stage 3, moderately decreased glomerular filtration rate between 30-59 mL/min/1.73 square  meter (HCC) 05/03/2014   COPD (chronic obstructive pulmonary disease) (HCC)    Coronary artery disease    Diabetes (HCC) 05/03/2014   Diabetes mellitus without complication (HCC)    type 1   Hyperlipidemia    Left shoulder pain    Peripheral vascular disease (HCC)    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/03/2014   Shortness of breath    Sleep apnea    mild OSA, no CPAP   Unstable  angina pectoris (HCC) 04/25/2014    Family History  Problem Relation Age of Onset   Stomach cancer Mother    Cancer Mother    Prostate cancer Father    Heart disease Father    Cancer - Prostate Father    Heart disease Brother    Asthma Daughter    Healthy Daughter    Asthma Daughter     Past Surgical History:  Procedure Laterality Date   ABDOMINAL AORTOGRAM W/LOWER EXTREMITY N/A 01/15/2023   Procedure: ABDOMINAL AORTOGRAM W/LOWER EXTREMITY;  Surgeon: Cephus Shelling, MD;  Location: [facility] INVASIVE CV LAB;  Service: Cardiovascular;  Laterality: N/A;   CARDIAC CATHETERIZATION  04/25/2014   BY DR Jacinto Halim   CARDIAC CATHETERIZATION N/A 10/30/2015   Procedure: Left Heart Cath and Cors/Grafts Angiography;  Surgeon: Yates Decamp, MD;  Location: [facility] INVASIVE CV LAB;  Service: Cardiovascular;  Laterality: N/A;   CARPAL TUNNEL RELEASE     CATARACT EXTRACTION, BILATERAL     CORONARY ARTERY BYPASS GRAFT N/A 04/27/2014   Procedure: CORONARY ARTERY BYPASS GRAFTING on pump using left internal mammary artery to LAD coronary artery, right great saphenous vein graft to diagonal coronary artery with sequential to OM1 and circumflex coronary arteries. Right greater saphenous vein graft to posterior descending coronary artery. ;  Surgeon: Delight Ovens, MD;  Location: [facility] OR;  Service: Open Heart Surgery;  Laterality: N   elbow drained Left 05/09/2019   ENDOVEIN HARVEST OF GREATER SAPHENOUS VEIN Right 04/27/2014   Procedure: ENDOVEIN HARVEST OF GREATER SAPHENOUS VEIN;  Surgeon: Delight Ovens, MD;  Location: [facility] OR;  Service: Open Heart Surgery;  Laterality: Right;   EXCISION ORAL TUMOR N/A 03/01/2018   Procedure: EXCISION ORAL TUMOR;  Surgeon: Christia Reading, MD;  Location: [facility];  Service: ENT;  Laterality: N/A;   EYE SURGERY     LASER   EYE SURGERY Bilateral    astigmatism correction    HARDWARE REMOVAL Left 10/03/2020   Procedure: LEFT FOOT REMOVAL HARDWARE, PLACE VANC POWDER;  Surgeon:  Nadara Mustard, MD;  Location: [facility] OR;  Service: Orthopedics;  Laterality: Left;   I & D EXTREMITY Left 03/04/2023   Procedure: LEFT ACHILLES DEBRIDEMENT;  Surgeon: Nadara Mustard, MD;  Location: [facility] OR;  Service: Orthopedics;  Laterality: Left;   INTRAOPERATIVE TRANSESOPHAGEAL ECHOCARDIOGRAM N/A 04/27/2014   Procedure: INTRAOPERATIVE TRANSESOPHAGEAL ECHOCARDIOGRAM;  Surgeon: Delight Ovens, MD;  Location: [facility] OR;  Service: Open Heart Surgery;  Laterality: N/A;   IR RADIOLOGIST EVAL & MGMT  12/22/2022   LEFT HEART CATHETERIZATION WITH CORONARY ANGIOGRAM N/A 04/25/2014   Procedure: LEFT HEART CATHETERIZATION WITH CORONARY ANGIOGRAM;  Surgeon: Pamella Pert, MD;  Location: [facility] CATH LAB;  Service: Cardiovascular;  Laterality: N/A;   MOUTH SURGERY  11/15/2017   tongue surgery    ORIF TOE FRACTURE Left 03/22/2020   Procedure: OPEN REDUCTION INTERNAL FIXATION (ORIF) Non Union 5th Metatarsal;  Surgeon: Kathryne Hitch, MD;  Location: [facility];  Service: Orthopedics;  Laterality: Left;  Social History   Occupational History   Not on file  Tobacco Use   Smoking status: Former    Current packs/day: 0.25    Average packs/day: 0.3 packs/day for 34.0 years (8.5 ttl pk-yrs)    Types: Cigarettes   Smokeless tobacco: Never  Vaping Use   Vaping status: Never Used  Substance and Sexual Activity   Alcohol use: Not Currently    Comment: RARE   Drug use: No   Sexual activity: Not on file

## 2023-06-10 NOTE — Telephone Encounter (Signed)
Pt called stating he seen Denny Peon today she was to send in an antibiotic to his pharmacy. Please send to pt pharmacy asap. Pt phone number is [phone].

## 2023-06-11 ENCOUNTER — Other Ambulatory Visit: Payer: Self-pay | Admitting: Family

## 2023-06-11 MED ORDER — DOXYCYCLINE HYCLATE 100 MG PO TABS: 100.0000 mg | ORAL_TABLET | Freq: Two times a day (BID) | ORAL | 0 refills | Status: DC

## 2023-06-11 NOTE — Telephone Encounter (Signed)
Erin had already sent in doxycycline after his apt yesterday.

## 2023-06-11 NOTE — Telephone Encounter (Signed)
It went to mail order pharmacy. I resent this to [facility]. Pt informed.

## 2023-06-17 ENCOUNTER — Ambulatory Visit: Payer: 59 | Admitting: Vascular Surgery

## 2023-06-17 NOTE — Progress Notes (Deleted)
Patient name: [name] MRN: [id number] DOB: 08/13/1965 Sex: male  REASON FOR CONSULT: wound check, 6 week interval   HPI: [name] is a 57 y.o. male, with hx CAD, CKD, COPD, DM, HLD, tobacco abuse that presents for 6 week wound check of left achilles wound.  This wound started about early May.  He did have ABIs on 12/31/2022 that were 1.16 on the right triphasic and noncompressible on the left triphasic.  He did have a marginal toe pressure of 58 on the left.  We previously performed a left lower extremity angiogram on 01/15/2023 showing two-vessel runoff in the anterior tibial and peroneal with inline flow.    Has had Kerecis grafting and debridement with Dr. [name].  Is concerned about the wound overall but feels it is slightly smaller.  Past Medical History:  Diagnosis Date   Anginal pain (HCC)    Anxiety    Arthritis    RA IN HANDS   Asthma    CAD (coronary artery disease) 04/27/2014   Chronic renal disease, stage 3, moderately decreased glomerular filtration rate between 30-59 mL/min/1.73 square meter (HCC) 05/03/2014   COPD (chronic obstructive pulmonary disease) (HCC)    Coronary artery disease    Diabetes (HCC) 05/03/2014   Diabetes mellitus without complication (HCC)    type 1   Hyperlipidemia    Left shoulder pain    Peripheral vascular disease (HCC)    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/03/2014   Shortness of breath    Sleep apnea    mild OSA, no CPAP   Unstable angina pectoris (HCC) 04/25/2014    Past Surgical History:  Procedure Laterality Date   ABDOMINAL AORTOGRAM W/LOWER EXTREMITY N/A 01/15/2023   Procedure: ABDOMINAL AORTOGRAM W/LOWER EXTREMITY;  Surgeon: Cephus Shelling, MD;  Location: [facility] INVASIVE CV LAB;  Service: Cardiovascular;  Laterality: N/A;   CARDIAC CATHETERIZATION  04/25/2014   BY DR Jacinto Halim   CARDIAC CATHETERIZATION N/A 10/30/2015   Procedure: Left Heart Cath and Cors/Grafts Angiography;  Surgeon: Yates Decamp, MD;   Location: [facility] INVASIVE CV LAB;  Service: Cardiovascular;  Laterality: N/A;   CARPAL TUNNEL RELEASE     CATARACT EXTRACTION, BILATERAL     CORONARY ARTERY BYPASS GRAFT N/A 04/27/2014   Procedure: CORONARY ARTERY BYPASS GRAFTING on pump using left internal mammary artery to LAD coronary artery, right great saphenous vein graft to diagonal coronary artery with sequential to OM1 and circumflex coronary arteries. Right greater saphenous vein graft to posterior descending coronary artery. ;  Surgeon: Delight Ovens, MD;  Location: [facility] OR;  Service: Open Heart Surgery;  Laterality: N   elbow drained Left 05/09/2019   ENDOVEIN HARVEST OF GREATER SAPHENOUS VEIN Right 04/27/2014   Procedure: ENDOVEIN HARVEST OF GREATER SAPHENOUS VEIN;  Surgeon: Delight Ovens, MD;  Location: [facility] OR;  Service: Open Heart Surgery;  Laterality: Right;   EXCISION ORAL TUMOR N/A 03/01/2018   Procedure: EXCISION ORAL TUMOR;  Surgeon: Christia Reading, MD;  Location: [facility];  Service: ENT;  Laterality: N/A;   EYE SURGERY     LASER   EYE SURGERY Bilateral    astigmatism correction    HARDWARE REMOVAL Left 10/03/2020   Procedure: LEFT FOOT REMOVAL HARDWARE, PLACE VANC POWDER;  Surgeon: Nadara Mustard, MD;  Location: [facility] OR;  Service: Orthopedics;  Laterality: Left;   I & D EXTREMITY Left 03/04/2023   Procedure: LEFT ACHILLES DEBRIDEMENT;  Surgeon: Nadara Mustard, MD;  Location: [facility]  OR;  Service: Orthopedics;  Laterality: Left;   INTRAOPERATIVE TRANSESOPHAGEAL ECHOCARDIOGRAM N/A 04/27/2014   Procedure: INTRAOPERATIVE TRANSESOPHAGEAL ECHOCARDIOGRAM;  Surgeon: Delight Ovens, MD;  Location: [facility] OR;  Service: Open Heart Surgery;  Laterality: N/A;   IR RADIOLOGIST EVAL & MGMT  12/22/2022   LEFT HEART CATHETERIZATION WITH CORONARY ANGIOGRAM N/A 04/25/2014   Procedure: LEFT HEART CATHETERIZATION WITH CORONARY ANGIOGRAM;  Surgeon: Pamella Pert, MD;  Location: [facility] CATH LAB;  Service: Cardiovascular;  Laterality: N/A;    MOUTH SURGERY  11/15/2017   tongue surgery    ORIF TOE FRACTURE Left 03/22/2020   Procedure: OPEN REDUCTION INTERNAL FIXATION (ORIF) Non Union 5th Metatarsal;  Surgeon: Kathryne Hitch, MD;  Location: [facility];  Service: Orthopedics;  Laterality: Left;    Family History  Problem Relation Age of Onset   Stomach cancer Mother    Cancer Mother    Prostate cancer Father    Heart disease Father    Cancer - Prostate Father    Heart disease Brother    Asthma Daughter    Healthy Daughter    Asthma Daughter     SOCIAL HISTORY: Social History   Socioeconomic History   Marital status: Divorced    Spouse name: Not on file   Number of children: 3   Years of education: Not on file   Highest education level: Not on file  Occupational History   Not on file  Tobacco Use   Smoking status: Former    Current packs/day: 0.25    Average packs/day: 0.3 packs/day for 34.0 years (8.5 ttl pk-yrs)    Types: Cigarettes   Smokeless tobacco: Never  Vaping Use   Vaping status: Never Used  Substance and Sexual Activity   Alcohol use: Not Currently    Comment: RARE   Drug use: No   Sexual activity: Not on file  Other Topics Concern   Not on file  Social History Narrative   Not on file   Social Determinants of Health   Financial Resource Strain: Not on file  Food Insecurity: Not on file  Transportation Needs: Not on file  Physical Activity: Not on file  Stress: Not on file  Social Connections: Not on file  Intimate Partner Violence: Not on file    Allergies  Allergen Reactions   Tramadol Shortness Of Breath   Metoprolol Diarrhea   Niacin And Related     night sweats    Current Outpatient Medications  Medication Sig Dispense Refill   albuterol (VENTOLIN HFA) 108 (90 Base) MCG/ACT inhaler INHALE 2 PUFFS BY MOUTH EVERY 6 HOURS AS NEEDED FOR WHEEZING 9 g 0   Ascorbic Acid (VITAMIN C PO) Take 2,000 mg by mouth daily.     aspirin EC 81 MG tablet Take 1 tablet  (81 mg total) by mouth daily. Swallow whole. 150 tablet 2   bisoprolol (ZEBETA) 5 MG tablet TAKE ONE-HALF TABLET BY MOUTH  DAILY 45 tablet 3   Blood Glucose Monitoring Suppl (ONETOUCH VERIO FLEX SYSTEM) w/Device KIT USE TO CHECK BLOOD GLUCOSE UP TO 10 TIMES PER DAY     buPROPion (WELLBUTRIN SR) 150 MG 12 hr tablet TAKE 1 TABLET BY MOUTH DAILY 90 tablet 3   calcium carbonate (OSCAL) 1500 (600 Ca) MG TABS tablet Take 600 mg of elemental calcium by mouth daily.     cholecalciferol (VITAMIN D3) 25 MCG (1000 UNIT) tablet Take 1,000 Units by mouth daily.     COD LIVER OIL PO Take 1  capsule by mouth daily.     Continuous Blood Gluc Sensor (FREESTYLE LIBRE 14 DAY SENSOR) MISC CHANGE EVERY 14 DAYS TO MONITOR BLOOD GLUCOSE     doxycycline (VIBRA-TABS) 100 MG tablet Take 1 tablet (100 mg total) by mouth 2 (two) times daily. 60 tablet 0   gabapentin (NEURONTIN) 300 MG capsule Take 1 capsule (300 mg total) by mouth at bedtime. 30 capsule 2   HUMALOG 100 UNIT/ML injection Inject 15-20 Units into the skin 3 (three) times daily with meals. Sliding scale  Depending on carb intake     insulin glargine (LANTUS) 100 UNIT/ML injection Inject 17 Units into the skin 2 (two) times daily.     levofloxacin (LEVAQUIN) 750 MG tablet Take 1 tablet (750 mg total) by mouth daily. 10 tablet 0   losartan (COZAAR) 25 MG tablet Take 25 mg by mouth daily.     Magnesium 500 MG CAPS Take 500 mg by mouth 2 (two) times daily.     nitroGLYCERIN (NITROSTAT) 0.4 MG SL tablet DISSOLVE 1 TABLET UNDER THE TONGUE EVERY 5 MINUTES AS  NEEDED FOR CHEST PAIN. MAX  OF 3 TABLETS IN 15 MINUTES. CALL 911 IF PAIN PERSISTS. (Patient taking differently: Place 0.4 mg under the tongue every 5 (five) minutes as needed for chest pain.) 100 tablet 3   Omega-3 Fatty Acids (FISH OIL PO) Take 5,000 mg by mouth daily.     OVER THE COUNTER MEDICATION Take 1 capsule by mouth 2 (two) times daily. Nitric oxide blood flow supplement     pentoxifylline (TRENTAL) 400 MG  CR tablet Take 1 tablet (400 mg total) by mouth 3 (three) times daily with meals. 90 tablet 0   Probiotic Product (ALIGN) 4 MG CAPS Take 4 mg by mouth daily.     ranolazine (RANEXA) 500 MG 12 hr tablet Take 250 mg by mouth at bedtime.     rivaroxaban (XARELTO) 2.5 MG TABS tablet Take 1 tablet (2.5 mg total) by mouth 2 (two) times daily. (Patient not taking: Reported on 03/31/2023) 180 tablet 1   SANTYL 250 UNIT/GM ointment Apply 1 Application topically 2 (two) times daily. (Patient not taking: Reported on 05/12/2023)     simvastatin (ZOCOR) 20 MG tablet TAKE 1 TABLET BY MOUTH AT BEDTIME 90 tablet 0   sulfamethoxazole-trimethoprim (BACTRIM DS) 800-160 MG tablet Take 1 tablet by mouth 2 (two) times daily. (Patient not taking: Reported on 05/12/2023)     TURMERIC CURCUMIN PO Take 1,500 mg by mouth 2 (two) times daily.     varenicline (CHANTIX) 0.5 MG tablet Take 0.5 mg by mouth daily.     vitamin B-12 (CYANOCOBALAMIN) 500 MCG tablet Take 500 mcg by mouth daily.     vitamin E 180 MG (400 UNITS) capsule Take 400 Units by mouth daily.     No current facility-administered medications for this visit.    REVIEW OF SYSTEMS:  [X]  denotes positive finding, [ ]  denotes negative finding Cardiac  Comments:  Chest pain or chest pressure:    Shortness of breath upon exertion:    Short of breath when lying flat:    Irregular heart rhythm:        Vascular    Pain in calf, thigh, or hip brought on by ambulation:    Pain in feet at night that wakes you up from your sleep:     Blood clot in your veins:    Leg swelling:         Pulmonary    Oxygen  at home:    Productive cough:     Wheezing:         Neurologic    Sudden weakness in arms or legs:     Sudden numbness in arms or legs:     Sudden onset of difficulty speaking or slurred speech:    Temporary loss of vision in one eye:     Problems with dizziness:         Gastrointestinal    Blood in stool:     Vomited blood:         Genitourinary     Burning when urinating:     Blood in urine:        Psychiatric    Major depression:         Hematologic    Bleeding problems:    Problems with blood clotting too easily:        Skin    Rashes or ulcers:        Constitutional    Fever or chills:      PHYSICAL EXAM: There were no vitals filed for this visit.   GENERAL: The patient is a well-nourished male, in no acute distress. The vital signs are documented above. CARDIAC: There is a regular rate and rhythm.  VASCULAR:  Palpable femoral pulses bilaterally No palpable pedal pulses, triphasic dorsalis pedis signal in left foot Left achilles wound pictured     DATA:   ABIs on 12/31/2022 that were 1.16 on the right triphasic and noncompressible on the left triphasic.    Assessment/Plan:  57 y.o. male, with hx CAD, CKD, COPD, DM, HLD, tobacco abuse that presents for 6 week follow-up and wound check of left achilles wound. This wound started about early May.  We did an angiogram on 01/15/2023 showing two-vessel runoff in the anterior tibial and peroneal and no intervention was performed and he had in-line flow.  He has since had debridement and grafting with Dr. [name].  Overall I think the wound is smaller.  I reiterated that these are very difficult to get to heal.  I will bring him back in another 4 to 6 weeks for ongoing wound check.  He has a brisk multiphasic DP signal in the left foot today.  I will get a left leg arterial duplex to ensure we are not missing any flow-limiting stenosis.  His PT is chronically occluded.   Cephus Shelling, MD [facility] Office: [phone]

## 2023-06-19 MED ORDER — LEVOFLOXACIN 750 MG PO TABS: 750.0000 mg | ORAL_TABLET | Freq: Every day | ORAL | 0 refills | Status: DC

## 2023-06-22 ENCOUNTER — Encounter (HOSPITAL_BASED_OUTPATIENT_CLINIC_OR_DEPARTMENT_OTHER): Payer: 59 | Attending: Internal Medicine | Admitting: Internal Medicine

## 2023-06-22 DIAGNOSIS — Z794 Long term (current) use of insulin: Secondary | ICD-10-CM | POA: Insufficient documentation

## 2023-06-22 DIAGNOSIS — E10621 Type 1 diabetes mellitus with foot ulcer: Secondary | ICD-10-CM | POA: Insufficient documentation

## 2023-06-22 DIAGNOSIS — I70245 Atherosclerosis of native arteries of left leg with ulceration of other part of foot: Secondary | ICD-10-CM | POA: Insufficient documentation

## 2023-06-22 DIAGNOSIS — E1051 Type 1 diabetes mellitus with diabetic peripheral angiopathy without gangrene: Secondary | ICD-10-CM | POA: Diagnosis not present

## 2023-06-22 DIAGNOSIS — L97528 Non-pressure chronic ulcer of other part of left foot with other specified severity: Secondary | ICD-10-CM | POA: Insufficient documentation

## 2023-06-22 DIAGNOSIS — J449 Chronic obstructive pulmonary disease, unspecified: Secondary | ICD-10-CM | POA: Diagnosis not present

## 2023-06-22 DIAGNOSIS — M069 Rheumatoid arthritis, unspecified: Secondary | ICD-10-CM | POA: Insufficient documentation

## 2023-06-23 NOTE — Progress Notes (Signed)
OKECHUKWU, GUIDOS ([id number]) [id number] Page 1 of 6 Visit Report for 06/22/2023 HPI Details Patient Name: Date of Service: [name], [name] 06/22/2023 12:30 PM Medical Record Number: [id number] Patient Account Number: [id number] Date of Birth/Sex: Treating RN: 01/04/1966 (57 y.o. M) Primary Care Provider: [name] Other Clinician: Referring Provider: Treating Provider/Extender: Theodis Sato in Treatment: 25 History of Present Illness HPI Description: 12/25/2022 [name] is a 57 year old male with a past medical history of controlled insulin-dependent type 1 diabetes, COPD and rheumatoid arthritis that presents the clinic for a 1 month history of nonhealing ulcer to the left foot. On 03/2020 patient had surgery for left fifth metatarsal fracture and subsequently had hardware removal in March 2022 due to infection. Over the past year he has experienced pain to the lateral left foot however the incision site and wound has healed. 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 He follows with Dr. [name] for this issue. Due to pain to the lateral left foot he uses Hoka sneakers however this rubbed at the back of his heel creating a wound to the Achilles region. He has since changed his shoe wear to boots that come up above this area or crocs. He has been able to offload this area over the past couple weeks. He is currently on doxycycline, pentoxifylline, nitroglycerin patch and cilostazol by Dr. [name] for his wound care. Currently patient denies signs of infection. ABI in office was 1.3 and he is scheduled for arterial duplex studies on 01/28/23. 5/30; patient presents for follow-up. He has been using Santyl to the wound bed. He had his ABIs completed that showed noncompressible on the left with TBI of 0.35. Currently denies signs of infection. 6/10;  patient presents for follow-up. He has been using Santyl to the wound bed. He had been referred to vein and vascular at our last clinic visit however he cannot be seen until 6/25. He is concerned about the wound. We will call today to see if we can facilitate an earlier appointment. He denies signs of infection. He has a 40 pack smoking history and currently smokes about half a pack a day. 6/21; patient presents for follow-up. 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 He feels a plantar arch with very brisk flow into the foot. There is some disease in the distal anterior tibial that did not appear flow- limiting. No intervention necessary. It was thought that he had enough blood flow for healing. He continues to use Santyl. 6/25; patient presents for follow-up. He had TCOM exam last Friday with probes located above, below and each side of the wound bed. T the superior probe is o reading was 68 mmHg, post O2 176, the lateral probe reading was 60 mmHg, post O2 129, the medial probe reading was 70 mmHg, post o2 176, the inferior probe reading was 182 mmHg. His primary care physician also ordered arterial segmental studies that are currently completed tomorrow. He has been using Santyl to the wound bed. He does have more granulation tissue present today. 7/1; patient presents for follow-up. He has been approved for hyperbaric oxygen therapy by his insurance. He has been using Santyl to the wound bed. He has no issues or complaints today. 7/9; patient presents for follow-up. Patient has had cardiac clearance by his cardiologist to start hyperbaric  oxygen therapy. We will start him Monday next week. He has been using Santyl to the wound bed. He has no issues or complaints today. 7/15; patient presents for follow-up. He started hyperbaric oxygen therapy for his right posterior heel wound secondary to  arterial insufficiency and type 2 diabetes. He tolerated the treatment well today. He has been using Santyl to the wound bed. He has no issues or complaints today. 7/19; Patient asked to be seen to discuss potential plans for surgery. T the wound bed he has been using Santyl. He denies signs of infection. Wound now o has minimal tendon exposed. 7/23; patient presents for follow-up. He has been using Santyl to the wound bed. Wound is slightly smaller today. He has no issues or complaints. He denies signs of infection. 7/29; patient presents for follow-up. Has been using Santyl to the wound bed. Plan is for OR debridement with skin substitute placement and wound VAC on 7/31. Patient is completing hyperbaric oxygen therapy without issues. 8/15; patient presents for follow-up. He has been completing hyperbaric oxygen therapy without issues. He has completed 20 out of 30 sessions. On 03/04/2023 he had debridement of the left Achilles by orthopedic surgery with placement of Kerecis. He states he had a wound VAC on for 1 week. He has been following Regularly with orthopedic and having Kerecis placed in the office. During the surgery he had cultures taken that grew central stenotrophomonas sensitive to Bactrim and Levaquin. He has completed a course of Bactrim and is now on Levaquin. He denies signs of infection. 8/29; patient presents for follow-up. He continues to follow with orthopedic surgery for his wound care. He states he had Kerecis placed in office on 8/27. We will not disrupt this and no dressings were taken off today. Pictures were reviewed from the chart (from 8/27) and wound appears well-healing. He has almost completed his 30 approved treatments for HBO in the setting of arterial insufficiency. He would like to continue this. He currently denies signs of infection. 9/12; patient presents for follow-up. He has been approved for 30 more sessions of hyperbaric oxygen therapy for arterial  insufficiency with nonhealing wound to the left Achilles region. He started the second round earlier this week. He has been tolerating hyperbaric oxygen therapy well. He is following with orthopedic surgery for his wound care. At his last appointment 2 days ago he had Kerecis placed in office. The wound has healthy granulation tissue present and appears well-healing. Patient is pleased with the progress. He has no issues or complaints today. He denies signs of infection. 9/26; this is a gentleman who is a type I diabetic with known significant PAD. By his description he has occlusion of the posterior tibial artery and his wound is on the left Achilles. We are doing hyperbaric oxygen and T com show an improvement in the oxygenation of this area both out of chamber and in chamber. He is now diving in the 30+ range second 30 dive approval. We are not following his wound which is being dressed at Dr. Audrie Lia office with Darlen Round powder kerlix and a wrap. They have been doing this every week. We did not look at the wound today but superficially on the pictures things look a lot better. Apparently this was a much larger wound when it was debrided surgically in late July. CHRISTIANJOSEPH, ROTHKOPF ([id number]) [id number] Page 2 of 6 9/30; patient also seen in follow-up today in conjunction with HBO. He is on diet 45 out of 60. Known  significant PAD which was apparently not amenable to revascularization. Noteworthy his posterior tibial on the left is occluded this would No Doubt be the area that supplies the wound on the Achilles tendon. We undressed the wound for the first time in a long time. This does not look quite as good as the picture the patient has on his phone that I looked at last week. I think he is somewhat upset by this. They apparently applied Kerecis powder to this area last time but gave the permission to the patient shower with this I am not clear that he has really been  dressing it since then. 10/14; patient is nearing the end of the course of his HBO treatment. He has a wound on the posterior Achilles area on the left. He is also been followed by orthopedics Dr. Audrie Lia office. They have been applying weekly Kerecis powder and they did this last week. The patient is able to show me pictures and he has a clean granulated wound however raised edges around the circumference are also present. He is not complaining of any pain. He is having no problems in HBO 10/29; the patient is completed 60 treatments of hyperbaric oxygen which she tolerated well. He is a diabetic with significant PAD. His recent arterial duplex showed a 50 to 74% stenosis in the left common femoral artery, 50 to 74% stenosis in the deep femoral artery and a lesser degree of stenosis in the superficial femoral artery. 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 His wound is on the left Achilles area. He has been getting weekly Kerecis at orthopedics. We have not really been following this wound. He is going to the orthopedic office tomorrow where he thinks he will get his last application of Kerecis 11/18; this is a patient we treated with hyperbaric oxygen. He is a diabetic with significant PAD and has a wound on his left Achilles area. He has been following with Dr. Audrie Lia office for this and getting weekly applications of Kerecis. I am not sure why he came into the office today however I did not look at the wound. I told him if he wanted to come here for wound care I be glad to see him but it did not make any sense for him to come to both offices. He tells me that the Darlen Round has been helping. He is also been on a prolonged course of high-dose Levaquin. When they change off the Levaquin he says the wound deteriorates Electronic Signature(s) Signed: 06/22/2023 4:31:26 PM By: [name] MD Entered By: [name] on 06/22/2023  10:01:42 -------------------------------------------------------------------------------- Physical Exam Details Patient Name: Date of Service: Reaser, [name] 06/22/2023 12:30 PM Medical Record Number: [id number] Patient Account Number: [id number] Date of Birth/Sex: Treating RN: Dec 04, 1965 (57 y.o. M) Primary Care Provider: [name] Other Clinician: Referring Provider: Treating Provider/Extender: Theodis Sato in Treatment: 25 Constitutional Sitting or standing Blood Pressure is within target range for patient.. Pulse regular and within target range for patient.Marland Kitchen Respirations regular, non-labored and within target range.. Temperature is normal and within the target range for the patient.Marland Kitchen Appears in no distress. Notes Wound exam; I did not look at the wound today. It had already been previously dressed Electronic Signature(s) Signed: 06/22/2023 4:31:26 PM By: [name] MD Entered By: [name] on 06/22/2023 10:02:20 -------------------------------------------------------------------------------- Physician Orders Details Patient Name: Date of Service: [name], [name] 06/22/2023 12:30 PM Medical Record Number: [id number] Patient Account Number: [id number] Date of Birth/Sex:  Treating RN: [birth year] (57 y.o. [name] Primary Care Provider: [name] Other Clinician: Referring Provider: Treating Provider/Extender: Theodis Sato in Treatment: 25 The following information was scribed by: Shawn Stall The information was scribed for: [name] Verbal / Phone Orders: No Diagnosis Coding LEIGHLAND, MISFELDT ([id number]) [id number] Page 3 of 6 ICD-10 Coding Code Description [phone] Non-pressure chronic ulcer of other part of left foot with other specified severity I70.245 Atherosclerosis of native arteries of left leg with ulceration of other part of foot E10.621  Type 1 diabetes mellitus with foot ulcer Discharge From [facility] Discharge from Wound Care Center - Will discharge from wound care center. Your decision to stay with current Treatment plan with Dr. [name] office. Call if you need wound care from [facility]. Wound Treatment Electronic Signature(s) Signed: 06/22/2023 4:31:26 PM By: [name] MD Signed: 06/22/2023 5:09:00 PM By: Shawn Stall RN, BSN Entered By: Shawn Stall on 06/22/2023 09:56:31 -------------------------------------------------------------------------------- Problem List Details Patient Name: Date of Service: [name], [name] 06/22/2023 12:30 PM Medical Record Number: [id number] Patient Account Number: [id number] Date of Birth/Sex: Treating RN: 08/26/1965 (57 y.o. [name] Primary Care Provider: [name] Other Clinician: Referring Provider: Treating Provider/Extender: Theodis Sato in Treatment: 25 Active Problems ICD-10 Encounter Code Description Active Date MDM Diagnosis L97.528 Non-pressure chronic ulcer of other part of left foot with other specified 12/25/2022 No Yes severity I70.245 Atherosclerosis of native arteries of left leg with ulceration of other part of 01/27/2023 No Yes foot E10.621 Type 1 diabetes mellitus with foot ulcer 12/25/2022 No Yes Inactive Problems ICD-10 Code Description Active Date Inactive Date J44.9 Chronic obstructive pulmonary disease, unspecified 12/25/2022 12/25/2022 M06.9 Rheumatoid arthritis, unspecified 12/25/2022 12/25/2022 U13.244 Personal history of nicotine dependence 01/27/2023 01/27/2023 Resolved Problems Electronic Signature(s) Signed: 06/22/2023 4:31:26 PM By: [name] MD Fiala, Signed: 06/22/2023 4:31:26 PM By: [name] MD [name] ([id number]) [id number] Page 4 of 6 Entered By: [name] on 06/22/2023  10:00:00 -------------------------------------------------------------------------------- Progress Note Details Patient Name: Date of Service: RICCELLI, [name] 06/22/2023 12:30 PM Medical Record Number: [id number] Patient Account Number: [id number] Date of Birth/Sex: Treating RN: Sep 19, 1965 (57 y.o. M) Primary Care Provider: [name] Other Clinician: Referring Provider: Treating Provider/Extender: Theodis Sato in Treatment: 25 Subjective History of Present Illness (HPI) 12/25/2022 Mr. Jeff Grube is a 57 year old male with a past medical history of controlled insulin-dependent type 1 diabetes, COPD and rheumatoid arthritis that presents the clinic for a 1 month history of nonhealing ulcer to the left foot. On 03/2020 patient had surgery for left fifth metatarsal fracture and subsequently had hardware removal in March 2022 due to infection. Over the past year he has experienced pain to the lateral left foot however the incision site and wound has healed. 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 He follows with Dr. [name] for this issue. Due to pain to the lateral left foot he uses Hoka sneakers however this rubbed at the back of his heel creating a wound to the Achilles region. He has since changed his shoe wear to boots that come up above this area or crocs. He has been able to offload this area over the past couple weeks. He is currently on doxycycline, pentoxifylline, nitroglycerin patch and cilostazol by Dr. [name] for his wound care. Currently patient denies signs of infection. ABI in office was 1.3 and he is  scheduled for arterial duplex studies on 01/28/23. 5/30; patient presents for follow-up. He has been using Santyl to the wound bed. He had his ABIs completed that showed noncompressible on the left with TBI of 0.35. Currently denies signs of infection. 6/10; patient  presents for follow-up. He has been using Santyl to the wound bed. He had been referred to vein and vascular at our last clinic visit however he cannot be seen until 6/25. He is concerned about the wound. We will call today to see if we can facilitate an earlier appointment. He denies signs of infection. He has a 40 pack smoking history and currently smokes about half a pack a day. 6/21; patient presents for follow-up. 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 He feels a plantar arch with very brisk flow into the foot. There is some disease in the distal anterior tibial that did not appear flow- limiting. No intervention necessary. It was thought that he had enough blood flow for healing. He continues to use Santyl. 6/25; patient presents for follow-up. He had TCOM exam last Friday with probes located above, below and each side of the wound bed. T the superior probe is o reading was 68 mmHg, post O2 176, the lateral probe reading was 60 mmHg, post O2 129, the medial probe reading was 70 mmHg, post o2 176, the inferior probe reading was 182 mmHg. His primary care physician also ordered arterial segmental studies that are currently completed tomorrow. He has been using Santyl to the wound bed. He does have more granulation tissue present today. 7/1; patient presents for follow-up. He has been approved for hyperbaric oxygen therapy by his insurance. He has been using Santyl to the wound bed. He has no issues or complaints today. 7/9; patient presents for follow-up. Patient has had cardiac clearance by his cardiologist to start hyperbaric oxygen therapy. We will start him Monday next week. He has been using Santyl to the wound bed. He has no issues or complaints today. 7/15; patient presents for follow-up. He started hyperbaric oxygen therapy for his right posterior heel wound secondary to arterial  insufficiency and type 2 diabetes. He tolerated the treatment well today. He has been using Santyl to the wound bed. He has no issues or complaints today. 7/19; Patient asked to be seen to discuss potential plans for surgery. T the wound bed he has been using Santyl. He denies signs of infection. Wound now o has minimal tendon exposed. 7/23; patient presents for follow-up. He has been using Santyl to the wound bed. Wound is slightly smaller today. He has no issues or complaints. He denies signs of infection. 7/29; patient presents for follow-up. Has been using Santyl to the wound bed. Plan is for OR debridement with skin substitute placement and wound VAC on 7/31. Patient is completing hyperbaric oxygen therapy without issues. 8/15; patient presents for follow-up. He has been completing hyperbaric oxygen therapy without issues. He has completed 20 out of 30 sessions. On 03/04/2023 he had debridement of the left Achilles by orthopedic surgery with placement of Kerecis. He states he had a wound VAC on for 1 week. He has been following Regularly with orthopedic and having Kerecis placed in the office. During the surgery he had cultures taken that grew central stenotrophomonas sensitive to Bactrim and Levaquin. He has completed a course of Bactrim and is now on Levaquin. He denies signs of infection. 8/29; patient  presents for follow-up. He continues to follow with orthopedic surgery for his wound care. He states he had Kerecis placed in office on 8/27. We will not disrupt this and no dressings were taken off today. Pictures were reviewed from the chart (from 8/27) and wound appears well-healing. He has almost completed his 30 approved treatments for HBO in the setting of arterial insufficiency. He would like to continue this. He currently denies signs of infection. 9/12; patient presents for follow-up. He has been approved for 30 more sessions of hyperbaric oxygen therapy for arterial insufficiency with  nonhealing wound to the left Achilles region. He started the second round earlier this week. He has been tolerating hyperbaric oxygen therapy well. He is following with orthopedic surgery for his wound care. At his last appointment 2 days ago he had Kerecis placed in office. The wound has healthy granulation tissue present and appears well-healing. Patient is pleased with the progress. He has no issues or complaints today. He denies signs of infection. 9/26; this is a gentleman who is a type I diabetic with known significant PAD. By his description he has occlusion of the posterior tibial artery and his wound is on the left Achilles. We are doing hyperbaric oxygen and T com show an improvement in the oxygenation of this area both out of chamber and in chamber. He is now diving in the 30+ range second 30 dive approval. We are not following his wound which is being dressed at Dr. Audrie Lia office with Darlen Round powder kerlix and a wrap. They have been doing this every week. We did not look at the wound today but superficially on the pictures things look a lot better. Apparently this was a much larger wound when it was debrided surgically in late July. DAMIERE, BURANDT ([id number]) [id number] Page 5 of 6 9/30; patient also seen in follow-up today in conjunction with HBO. He is on diet 45 out of 60. Known significant PAD which was apparently not amenable to revascularization. Noteworthy his posterior tibial on the left is occluded this would No Doubt be the area that supplies the wound on the Achilles tendon. We undressed the wound for the first time in a long time. This does not look quite as good as the picture the patient has on his phone that I looked at last week. I think he is somewhat upset by this. They apparently applied Kerecis powder to this area last time but gave the permission to the patient shower with this I am not clear that he has really been dressing it since  then. 10/14; patient is nearing the end of the course of his HBO treatment. He has a wound on the posterior Achilles area on the left. He is also been followed by orthopedics Dr. Audrie Lia office. They have been applying weekly Kerecis powder and they did this last week. The patient is able to show me pictures and he has a clean granulated wound however raised edges around the circumference are also present. He is not complaining of any pain. He is having no problems in HBO 10/29; the patient is completed 60 treatments of hyperbaric oxygen which she tolerated well. He is a diabetic with significant PAD. His recent arterial duplex showed a 50 to 74% stenosis in the left common femoral artery, 50 to 74% stenosis in the deep femoral artery and a lesser degree of stenosis in the superficial femoral artery. 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 His wound is on the left Achilles area.  He has been getting weekly Kerecis at orthopedics. We have not really been following this wound. He is going to the orthopedic office tomorrow where he thinks he will get his last application of Kerecis 11/18; this is a patient we treated with hyperbaric oxygen. He is a diabetic with significant PAD and has a wound on his left Achilles area. He has been following with Dr. Audrie Lia office for this and getting weekly applications of Kerecis. I am not sure why he came into the office today however I did not look at the wound. I told him if he wanted to come here for wound care I be glad to see him but it did not make any sense for him to come to both offices. He tells me that the Darlen Round has been helping. He is also been on a prolonged course of high-dose Levaquin. When they change off the Levaquin he says the wound deteriorates Objective Constitutional Sitting or standing Blood Pressure is within target range for patient.. Pulse regular and within target range for patient.Marland Kitchen Respirations regular, non-labored and within  target range.. Temperature is normal and within the target range for the patient.Marland Kitchen Appears in no distress. Vitals Time Taken: 12:45 PM, Height: 68 in, Weight: 200 lbs, BMI: 30.4, Temperature: 98 F, Pulse: 72 bpm, Respiratory Rate: 20 breaths/min, Blood Pressure: 137/73 mmHg, Capillary Blood Glucose: 78 mg/dl. General Notes: Wound exam; I did not look at the wound today. 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 Your decision to stay with current Treatment plan with Dr. [name] office. Call if you need wound care from [facility]. 1. I told the patient I be glad to see him if he decided to come to this office for wound care. Right now he is receiving weekly application of Kerecis through Dr. Audrie Lia office. The patient thinks that he is improving although there have been variables such as infection and antibiotic use he is also talking about 2. Going to 2 different offices for the same wound is something that simply should not be done and I talked to him about that in no uncertain terms Electronic Signature(s) Signed: 06/22/2023 4:31:26 PM By: [name] MD Entered By: [name] on 06/22/2023 10:03:44 [name] ([id number]) [id number] Page 6 of 6 -------------------------------------------------------------------------------- SuperBill Details Patient Name: Date of Service: [place], [name] 06/22/2023 Medical Record Number: [id number] Patient Account Number: [id number] Date of Birth/Sex: Treating RN: March 06, 1966 (57 y.o. [name] Primary Care Provider: [name] Other Clinician: Referring  Provider: Treating Provider/Extender: Theodis Sato in Treatment: 25 Diagnosis Coding ICD-10 Codes Code Description [phone] Non-pressure chronic ulcer of other part of left foot with other specified severity I70.245 Atherosclerosis of native arteries of left leg with ulceration of other part of foot E10.621 Type 1 diabetes mellitus with foot ulcer Facility Procedures : CPT4 Code: 33295188 Description: 41660 - WOUND CARE VISIT-LEV 2 EST PT Modifier: Quantity: 1 Electronic Signature(s) Signed: 06/22/2023 4:31:26 PM By: [name] MD Entered By: [name] on 06/22/2023 10:04:24

## 2023-06-23 NOTE — Progress Notes (Signed)
[name], [name] ([id number]) [id number] Page 1 of 5 Visit Report for 06/22/2023 Arrival Information Details Patient Name: Date of Service: [name], [name] 06/22/2023 12:30 PM Medical Record Number: [id number] Patient Account Number: [id number] Date of Birth/Sex: Treating RN: March 03, 1966 (57 y.o. [name], [name] Primary Care [name]: [name] Other Clinician: Referring [name]: Treating [name]/Extender: [name] in Treatment: 25 Visit Information History Since Last Visit Added or deleted any medications: No Patient Arrived: Ambulatory Any new allergies or adverse reactions: No Arrival Time: 12:41 Had a fall or experienced change in No Accompanied By: self activities of daily living that may affect Transfer Assistance: None risk of falls: Patient Identification Verified: Yes Signs or symptoms of abuse/neglect since last visito No Secondary Verification Process Completed: Yes Hospitalized since last visit: No Patient Requires Transmission-Based Precautions: No Implantable device outside of the clinic excluding No Patient Has Alerts: No cellular tissue based products placed in the center since last visit: Has Dressing in Place as Prescribed: Yes Pain Present Now: No Notes Per patient still seeing Dr. [name] office. Per patient that office stopped antibiotics and felt he needed more. Explained insurance may not pay for wound care from both surgeon office and wound center. Electronic Signature(s) Signed: 06/22/2023 5:09:00 PM By: [name] Stall RN, BSN Entered By: [name] on 06/22/2023 09:45:35 -------------------------------------------------------------------------------- Clinic Level of Care Assessment Details Patient Name: Date of Service: [name], [name] 06/22/2023 12:30 PM Medical Record Number: [id number] Patient Account Number: [id number] Date of Birth/Sex: Treating RN: 05-22-1966 (57 y.o. [name] Primary Care Kaylob Wallen: [name] Other Clinician: Referring Tristram Milian: Treating [name]/Extender: [name] in Treatment: 25 Clinic Level of Care Assessment Items TOOL 4 Quantity Score []  - 0 Use when only an EandM is performed on FOLLOW-UP visit ASSESSMENTS - Nursing Assessment / Reassessment X- 1 10 Reassessment of Co-morbidities (includes updates in patient status) X- 1 5 Reassessment of Adherence to Treatment Plan ASSESSMENTS - Wound and Skin A ssessment / Reassessment []  - 0 Simple Wound Assessment / Reassessment - one wound []  - 0 Complex Wound Assessment / Reassessment - multiple wounds []  - 0 Dermatologic / Skin Assessment (not related to wound area) ASSESSMENTS - Focused Assessment [name] ([id number]) [id number] Page 2 of 5 []  - 0 Circumferential Edema Measurements - multi extremities []  - 0 Nutritional Assessment / Counseling / Intervention []  - 0 Lower Extremity Assessment (monofilament, tuning fork, pulses) []  - 0 Peripheral Arterial Disease Assessment (using hand held doppler) ASSESSMENTS - Ostomy and/or Continence Assessment and Care []  - 0 Incontinence Assessment and Management []  - 0 Ostomy Care Assessment and Management (repouching, etc.) PROCESS - Coordination of Care X - Simple Patient / Family Education for ongoing care 1 15 []  - 0 Complex (extensive) Patient / Family Education for ongoing care X- 1 10 Staff obtains Chiropractor, Records, T Results / Process Orders est []  - 0 Staff telephones HHA, Nursing Homes / Clarify orders / etc []  - 0 Routine Transfer to another Facility (non-emergent condition) []  - 0 Routine Hospital Admission (non-emergent condition) []  - 0 New Admissions / Manufacturing engineer / Ordering NPWT Apligraf, etc. , []  - 0 Emergency Hospital Admission (emergent condition) X- 1 10 Simple Discharge Coordination []  - 0 Complex  (extensive) Discharge Coordination PROCESS - Special Needs []  - 0 Pediatric / Minor Patient Management []  - 0 Isolation Patient Management []  - 0 Hearing / Language / Visual special needs []  - 0 Assessment of Community assistance (  transportation, D/C planning, etc.) []  - 0 Additional assistance / Altered mentation []  - 0 Support Surface(s) Assessment (bed, cushion, seat, etc.) INTERVENTIONS - Wound Cleansing / Measurement []  - 0 Simple Wound Cleansing - one wound []  - 0 Complex Wound Cleansing - multiple wounds []  - 0 Wound Imaging (photographs - any number of wounds) []  - 0 Wound Tracing (instead of photographs) []  - 0 Simple Wound Measurement - one wound []  - 0 Complex Wound Measurement - multiple wounds INTERVENTIONS - Wound Dressings []  - 0 Small Wound Dressing one or multiple wounds []  - 0 Medium Wound Dressing one or multiple wounds []  - 0 Large Wound Dressing one or multiple wounds []  - 0 Application of Medications - topical []  - 0 Application of Medications - injection INTERVENTIONS - Miscellaneous []  - 0 External ear exam []  - 0 Specimen Collection (cultures, biopsies, blood, body fluids, etc.) []  - 0 Specimen(s) / Culture(s) sent or taken to Lab for analysis []  - 0 Patient Transfer (multiple staff / Michiel Sites Lift / Similar devices) []  - 0 Simple Staple / Suture removal (25 or less) [name] ([id number]) [id number] Page 3 of 5 []  - 0 Complex Staple / Suture removal (26 or more) []  - 0 Hypo / Hyperglycemic Management (close monitor of Blood Glucose) []  - 0 Ankle / Brachial Index (ABI) - do not check if billed separately X- 1 5 Vital Signs Has the patient been seen at the hospital within the last three years: Yes Total Score: 55 Level Of Care: New/Established - Level 2 Electronic Signature(s) Signed: 06/22/2023 5:09:00 PM By: [name] Stall RN, BSN Entered By: [name] on 06/22/2023  10:00:34 -------------------------------------------------------------------------------- Encounter Discharge Information Details Patient Name: Date of Service: [name] [place], [place] [name] J. 06/22/2023 12:30 PM Medical Record Number: [id number] Patient Account Number: [id number] Date of Birth/Sex: Treating RN: October 12, 1965 (57 y.o. [name] Primary Care Keron Koffman: [name] Other Clinician: Referring Daimian Sudberry: Treating Kamarri Fischetti/Extender: [name] in Treatment: 25 Encounter Discharge Information Items Discharge Condition: Stable Ambulatory Status: Ambulatory Discharge Destination: Home Transportation: Private Auto Accompanied By: self Schedule Follow-up Appointment: No Clinical Summary of Care: Notes patient to continue with Dr. [name] office and will discharge patient for now. Patient to continue with Dr. [name] office treatment plan. Electronic Signature(s) Signed: 06/22/2023 5:09:00 PM By: [name] Stall RN, BSN Entered By: [name] on 06/22/2023 10:01:43 -------------------------------------------------------------------------------- Multi-Disciplinary Care Plan Details Patient Name: Date of Service: [place], [name] 06/22/2023 12:30 PM Medical Record Number: [id number] Patient Account Number: [id number] Date of Birth/Sex: Treating RN: Jan 12, 1966 (57 y.o. [name] Primary Care Azoria Abbett: [name] Other Clinician: Referring Ara Mano: Treating Lesly Joslyn/Extender: [name] in Treatment: 25 Active Inactive Electronic Signature(s) Signed: 06/22/2023 5:09:00 PM By: [name] Stall RN, BSN Entered By: [name] on 06/22/2023 09:53:33 [name] ([id number]) [id number] Page 4 of 5 -------------------------------------------------------------------------------- Pain Assessment Details Patient Name: Date of Service: [place], [name] 06/22/2023 12:30 PM Medical  Record Number: [id number] Patient Account Number: [id number] Date of Birth/Sex: Treating RN: 19-Jul-1966 (57 y.o. [name] Primary Care Boomer Winders: [name] Other Clinician: Referring Autumnrose Yore: Treating Felix Meras/Extender: [name] in Treatment: 25 Active Problems Location of Pain Severity and Description of Pain Patient Has Paino No Site Locations Pain Management and Medication Current Pain Management: Electronic Signature(s) Signed: 06/22/2023 5:09:00 PM By: [name] Stall RN, BSN Entered By: [name] on 06/22/2023 09:45:51 -------------------------------------------------------------------------------- Patient/Caregiver Education Details Patient Name: Date of Service: [place], [place] [name]  J. 11/18/2024andnbsp12:30 PM Medical Record Number: [id number] Patient Account Number: [id number] Date of Birth/Gender: Treating RN: 08/11/1965 (57 y.o. [name] Primary Care Physician: [name] Other Clinician: Referring Physician: Treating Physician/Extender: [name] in Treatment: 25 Education Assessment Education Provided To: Patient Education Topics Provided Wound/Skin Impairment: Handouts: Caring for Your Ulcer Methods: Explain/Verbal Responses: Reinforcements needed VANG, MCELMURRY ([id number]) [phone].pdf Page 5 of 5 Electronic Signature(s) Signed: 06/22/2023 5:09:00 PM By: [name] Stall RN, BSN Entered By: [name] on 06/22/2023 09:53:47 -------------------------------------------------------------------------------- Vitals Details Patient Name: Date of Service: [name], [place] [name] J. 06/22/2023 12:30 PM Medical Record Number: [id number] Patient Account Number: [id number] Date of Birth/Sex: Treating RN: 06/16/1966 (57 y.o. [name] Primary Care Lakshya Mcgillicuddy: [name] Other Clinician: Referring Joelly Bolanos: Treating Kania Regnier/Extender: [name] in Treatment: 25 Vital Signs Time Taken: 12:45 Temperature (F): 98 Height (in): 68 Pulse (bpm): 72 Weight (lbs): 200 Respiratory Rate (breaths/min): 20 Body Mass Index (BMI): 30.4 Blood Pressure (mmHg): 137/73 Capillary Blood Glucose (mg/dl): 78 Reference Range: 80 - 120 mg / dl Electronic Signature(s) Signed: 06/22/2023 5:09:00 PM By: [name] Stall RN, BSN Entered By: [name] on 06/22/2023 09:45:46

## 2023-06-24 ENCOUNTER — Encounter: Payer: Self-pay | Admitting: Family

## 2023-06-24 ENCOUNTER — Ambulatory Visit (INDEPENDENT_AMBULATORY_CARE_PROVIDER_SITE_OTHER): Payer: 59 | Admitting: Family

## 2023-06-24 DIAGNOSIS — L97421 Non-pressure chronic ulcer of left heel and midfoot limited to breakdown of skin: Secondary | ICD-10-CM

## 2023-06-24 DIAGNOSIS — S99192G Other physeal fracture of left metatarsal, subsequent encounter for fracture with delayed healing: Secondary | ICD-10-CM

## 2023-06-24 NOTE — Progress Notes (Signed)
Post-Op Visit Note   Patient: [name]           Date of Birth: 1966-03-01           MRN: [id number] Visit Date: 06/24/2023 PCP: Garlan Fillers, MD  Chief Complaint:  Chief Complaint  Patient presents with   Left Leg - Follow-up    03/04/2023 left achilles debridement     HPI:  HPI The patient is a 57 year old gentleman who is seen in follow-up status post left Achilles debridement on July 31 he has had Kerecis micro powder applied in the office, donated, biweekly for last 1 month.  Has returned to work for [employer] duty.  He also continues with home exercise program as well as hyperbaric oxygen treatments, has quit smoking.     Ortho Exam On examination of the left Achilles area of the ulcer is 100% filled in with granulation there is good uptake of the Kerecis powder.  There is no depth.  The measurements today are 0.8 centimeters by 0.5 cm in width there is no depth no surrounding erythema however appears to be healing. Is stable.  No erythema warmth.  Donation Kerecis micro powder applied again today.  Visit Diagnoses: No diagnosis found.  Plan: Kerecis micro powder donation reapplied today.  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   Imaging: No results found.  Orders:  No orders of the defined types were placed in this encounter.  No orders of the defined types were placed in this encounter.    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 03/22/2020   Osteopenia of multiple sites 02/28/2020    PVD (peripheral vascular disease) (HCC) 10/11/2019   Chronic venous insufficiency 10/05/2018   Diabetic cataract (HCC) 05/21/2018   Controlled type 1 diabetes mellitus with diabetic peripheral angiopathy without gangrene (HCC) 08/14/2017   Dyslipidemia 03/26/2017   High risk medication use 09/23/2016   History of hypertension 09/23/2016   History of chronic kidney disease 09/23/2016   History of coronary artery disease 09/23/2016   Tobacco abuse 09/23/2016   Primary osteoarthritis of both knees 09/23/2016   History of diabetes mellitus 09/23/2016   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 (HCC) 11/20/2015   Chest pain with high risk for cardiac etiology 10/29/2015   Asymptomatic bilateral carotid artery stenosis 06/08/2014   Pleural effusion, bilateral 06/03/2014   Dyspnea 06/01/2014   Diabetes (HCC) 05/03/2014   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/03/2014   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   Chronic renal disease, stage 3, moderately decreased glomerular filtration rate between 30-59 mL/min/1.73 square meter (HCC) 05/03/2014   CAD (coronary artery disease) 04/27/2014   Acne 12/19/2013   On isotretinoin therapy 12/19/2013   Nuclear sclerotic cataract, bilateral 12/19/2011   Vitreous hemorrhage (HCC) 06/16/2011   Past Medical History:  Diagnosis Date   Anginal pain (HCC)    Anxiety    Arthritis    RA IN HANDS  Asthma    CAD (coronary artery disease) 04/27/2014   Chronic renal disease, stage 3, moderately decreased glomerular filtration rate between 30-59 mL/min/1.73 square meter (HCC) 05/03/2014   COPD (chronic obstructive pulmonary disease) (HCC)    Coronary artery disease    Diabetes (HCC) 05/03/2014   Diabetes mellitus without complication (HCC)    type 1   Hyperlipidemia    Left shoulder pain    Peripheral vascular disease (HCC)    Rheumatoid arthritis  involving multiple joints (HCC) 05/03/2014   Shortness of breath    Sleep apnea    mild OSA, no CPAP   Unstable angina pectoris (HCC) 04/25/2014    Family History  Problem Relation Age of Onset   Stomach cancer Mother    Cancer Mother    Prostate cancer Father    Heart disease Father    Cancer - Prostate Father    Heart disease Brother    Asthma Daughter    Healthy Daughter    Asthma Daughter     Past Surgical History:  Procedure Laterality Date   ABDOMINAL AORTOGRAM W/LOWER EXTREMITY N/A 01/15/2023   Procedure: ABDOMINAL AORTOGRAM W/LOWER EXTREMITY;  Surgeon: Cephus Shelling, MD;  Location: [facility] INVASIVE CV LAB;  Service: Cardiovascular;  Laterality: N/A;   CARDIAC CATHETERIZATION  04/25/2014   BY DR Jacinto Halim   CARDIAC CATHETERIZATION N/A 10/30/2015   Procedure: Left Heart Cath and Cors/Grafts Angiography;  Surgeon: Yates Decamp, MD;  Location: [facility] INVASIVE CV LAB;  Service: Cardiovascular;  Laterality: N/A;   CARPAL TUNNEL RELEASE     CATARACT EXTRACTION, BILATERAL     CORONARY ARTERY BYPASS GRAFT N/A 04/27/2014   Procedure: CORONARY ARTERY BYPASS GRAFTING on pump using left internal mammary artery to LAD coronary artery, right great saphenous vein graft to diagonal coronary artery with sequential to OM1 and circumflex coronary arteries. Right greater saphenous vein graft to posterior descending coronary artery. ;  Surgeon: Delight Ovens, MD;  Location: [facility] OR;  Service: Open Heart Surgery;  Laterality: N   elbow drained Left 05/09/2019   ENDOVEIN HARVEST OF GREATER SAPHENOUS VEIN Right 04/27/2014   Procedure: ENDOVEIN HARVEST OF GREATER SAPHENOUS VEIN;  Surgeon: Delight Ovens, MD;  Location: [facility] OR;  Service: Open Heart Surgery;  Laterality: Right;   EXCISION ORAL TUMOR N/A 03/01/2018   Procedure: EXCISION ORAL TUMOR;  Surgeon: Christia Reading, MD;  Location: [facility];  Service: ENT;  Laterality: N/A;   EYE SURGERY     LASER   EYE SURGERY Bilateral    astigmatism  correction    HARDWARE REMOVAL Left 10/03/2020   Procedure: LEFT FOOT REMOVAL HARDWARE, PLACE VANC POWDER;  Surgeon: Nadara Mustard, MD;  Location: [facility] OR;  Service: Orthopedics;  Laterality: Left;   I & D EXTREMITY Left 03/04/2023   Procedure: LEFT ACHILLES DEBRIDEMENT;  Surgeon: Nadara Mustard, MD;  Location: [facility] OR;  Service: Orthopedics;  Laterality: Left;   INTRAOPERATIVE TRANSESOPHAGEAL ECHOCARDIOGRAM N/A 04/27/2014   Procedure: INTRAOPERATIVE TRANSESOPHAGEAL ECHOCARDIOGRAM;  Surgeon: Delight Ovens, MD;  Location: [facility] OR;  Service: Open Heart Surgery;  Laterality: N/A;   IR RADIOLOGIST EVAL & MGMT  12/22/2022   LEFT HEART CATHETERIZATION WITH CORONARY ANGIOGRAM N/A 04/25/2014   Procedure: LEFT HEART CATHETERIZATION WITH CORONARY ANGIOGRAM;  Surgeon: Pamella Pert, MD;  Location: [facility] CATH LAB;  Service: Cardiovascular;  Laterality: N/A;   MOUTH SURGERY  11/15/2017   tongue surgery    ORIF TOE FRACTURE Left 03/22/2020   Procedure: OPEN REDUCTION  INTERNAL FIXATION (ORIF) Non Union 5th Metatarsal;  Surgeon: Kathryne Hitch, MD;  Location: [facility];  Service: Orthopedics;  Laterality: Left;   Social History   Occupational History   Not on file  Tobacco Use   Smoking status: Former    Current packs/day: 0.25    Average packs/day: 0.3 packs/day for 34.0 years (8.5 ttl pk-yrs)    Types: Cigarettes   Smokeless tobacco: Never  Vaping Use   Vaping status: Never Used  Substance and Sexual Activity   Alcohol use: Not Currently    Comment: RARE   Drug use: No   Sexual activity: Not on file

## 2023-06-28 ENCOUNTER — Other Ambulatory Visit: Payer: Self-pay | Admitting: Cardiology

## 2023-06-30 ENCOUNTER — Encounter: Payer: Self-pay | Admitting: Vascular Surgery

## 2023-06-30 ENCOUNTER — Ambulatory Visit (INDEPENDENT_AMBULATORY_CARE_PROVIDER_SITE_OTHER): Payer: 59 | Admitting: Vascular Surgery

## 2023-06-30 VITALS — BP 136/67 | HR 79 | Temp 98.1°F | Ht 68.0 in | Wt 211.0 lb

## 2023-06-30 DIAGNOSIS — I739 Peripheral vascular disease, unspecified: Secondary | ICD-10-CM | POA: Diagnosis not present

## 2023-06-30 NOTE — Progress Notes (Signed)
Patient name: [name] MRN: [id number] DOB: 1966/04/28 Sex: male  REASON FOR CONSULT: wound check  HPI: [name] is a 57 y.o. male, with hx CAD, CKD, COPD, DM, HLD, tobacco abuse that presents for wound check of left achilles wound.  This wound started about early May.  He did have ABIs on 12/31/2022 that were 1.16 on the right triphasic and noncompressible on the left triphasic.  He did have a marginal toe pressure of 58 on the left.  We previously performed a left lower extremity angiogram on 01/15/2023 showing two-vessel runoff in the anterior tibial and peroneal with inline flow.    Has had Kerecis grafting and debridement with Dr. [name].    The wound is continuing to improve on a slow and steady basis.  Past Medical History:  Diagnosis Date   Anginal pain (HCC)    Anxiety    Arthritis    RA IN HANDS   Asthma    CAD (coronary artery disease) 04/27/2014   Chronic renal disease, stage 3, moderately decreased glomerular filtration rate between 30-59 mL/min/1.73 square meter (HCC) 05/03/2014   COPD (chronic obstructive pulmonary disease) (HCC)    Coronary artery disease    Diabetes (HCC) 05/03/2014   Diabetes mellitus without complication (HCC)    type 1   Hyperlipidemia    Left shoulder pain    Peripheral vascular disease (HCC)    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/03/2014   Shortness of breath    Sleep apnea    mild OSA, no CPAP   Unstable angina pectoris (HCC) 04/25/2014    Past Surgical History:  Procedure Laterality Date   ABDOMINAL AORTOGRAM W/LOWER EXTREMITY N/A 01/15/2023   Procedure: ABDOMINAL AORTOGRAM W/LOWER EXTREMITY;  Surgeon: Cephus Shelling, MD;  Location: [facility] INVASIVE CV LAB;  Service: Cardiovascular;  Laterality: N/A;   CARDIAC CATHETERIZATION  04/25/2014   BY DR Jacinto Halim   CARDIAC CATHETERIZATION N/A 10/30/2015   Procedure: Left Heart Cath and Cors/Grafts Angiography;  Surgeon: Yates Decamp, MD;  [facility] INVASIVE CV  LAB;  Service: Cardiovascular;  Laterality: N/A;   CARPAL TUNNEL RELEASE     CATARACT EXTRACTION, BILATERAL     CORONARY ARTERY BYPASS GRAFT N/A 04/27/2014   Procedure: CORONARY ARTERY BYPASS GRAFTING on pump using left internal mammary artery to LAD coronary artery, right great saphenous vein graft to diagonal coronary artery with sequential to OM1 and circumflex coronary arteries. Right greater saphenous vein graft to posterior descending coronary artery. ;  Surgeon: Delight Ovens, MD;  Location: [facility] OR;  Service: Open Heart Surgery;  Laterality: N   elbow drained Left 05/09/2019   ENDOVEIN HARVEST OF GREATER SAPHENOUS VEIN Right 04/27/2014   Procedure: ENDOVEIN HARVEST OF GREATER SAPHENOUS VEIN;  Surgeon: Delight Ovens, MD;  Location: [facility] OR;  Service: Open Heart Surgery;  Laterality: Right;   EXCISION ORAL TUMOR N/A 03/01/2018   Procedure: EXCISION ORAL TUMOR;  Surgeon: Christia Reading, MD;  Location: [facility];  Service: ENT;  Laterality: N/A;   EYE SURGERY     LASER   EYE SURGERY Bilateral    astigmatism correction    HARDWARE REMOVAL Left 10/03/2020   Procedure: LEFT FOOT REMOVAL HARDWARE, PLACE VANC POWDER;  Surgeon: Nadara Mustard, MD;  Location: [facility] OR;  Service: Orthopedics;  Laterality: Left;   I & D EXTREMITY Left 03/04/2023   Procedure: LEFT ACHILLES DEBRIDEMENT;  Surgeon: Nadara Mustard, MD;  Location: [facility] OR;  Service: Orthopedics;  Laterality: Left;   INTRAOPERATIVE TRANSESOPHAGEAL ECHOCARDIOGRAM N/A 04/27/2014   Procedure: INTRAOPERATIVE TRANSESOPHAGEAL ECHOCARDIOGRAM;  Surgeon: Delight Ovens, MD;  Location: [facility] OR;  Service: Open Heart Surgery;  Laterality: N/A;   IR RADIOLOGIST EVAL & MGMT  12/22/2022   LEFT HEART CATHETERIZATION WITH CORONARY ANGIOGRAM N/A 04/25/2014   Procedure: LEFT HEART CATHETERIZATION WITH CORONARY ANGIOGRAM;  Surgeon: Pamella Pert, MD;  Location: [facility] CATH LAB;  Service: Cardiovascular;  Laterality: N/A;   MOUTH SURGERY  11/15/2017    tongue surgery    ORIF TOE FRACTURE Left 03/22/2020   Procedure: OPEN REDUCTION INTERNAL FIXATION (ORIF) Non Union 5th Metatarsal;  Surgeon: Kathryne Hitch, MD;  Location: [facility];  Service: Orthopedics;  Laterality: Left;    Family History  Problem Relation Age of Onset   Stomach cancer Mother    Cancer Mother    Prostate cancer Father    Heart disease Father    Cancer - Prostate Father    Heart disease Brother    Asthma Daughter    Healthy Daughter    Asthma Daughter     SOCIAL HISTORY: Social History   Socioeconomic History   Marital status: Divorced    Spouse name: Not on file   Number of children: 3   Years of education: Not on file   Highest education level: Not on file  Occupational History   Not on file  Tobacco Use   Smoking status: Former    Current packs/day: 0.25    Average packs/day: 0.3 packs/day for 34.0 years (8.5 ttl pk-yrs)    Types: Cigarettes   Smokeless tobacco: Never  Vaping Use   Vaping status: Never Used  Substance and Sexual Activity   Alcohol use: Not Currently    Comment: RARE   Drug use: No   Sexual activity: Not on file  Other Topics Concern   Not on file  Social History Narrative   Not on file   Social Determinants of Health   Financial Resource Strain: Not on file  Food Insecurity: Not on file  Transportation Needs: Not on file  Physical Activity: Not on file  Stress: Not on file  Social Connections: Not on file  Intimate Partner Violence: Not on file    Allergies  Allergen Reactions   Tramadol Shortness Of Breath   Metoprolol Diarrhea   Niacin And Related     night sweats    Current Outpatient Medications  Medication Sig Dispense Refill   albuterol (VENTOLIN HFA) 108 (90 Base) MCG/ACT inhaler INHALE 2 PUFFS BY MOUTH EVERY 6 HOURS AS NEEDED FOR WHEEZING 9 g 0   Ascorbic Acid (VITAMIN C PO) Take 2,000 mg by mouth daily.     aspirin EC 81 MG tablet Take 1 tablet (81 mg total) by mouth daily.  Swallow whole. 150 tablet 2   bisoprolol (ZEBETA) 5 MG tablet TAKE ONE-HALF TABLET BY MOUTH  DAILY 45 tablet 3   Blood Glucose Monitoring Suppl (ONETOUCH VERIO FLEX SYSTEM) w/Device KIT USE TO CHECK BLOOD GLUCOSE UP TO 10 TIMES PER DAY     buPROPion (WELLBUTRIN SR) 150 MG 12 hr tablet TAKE 1 TABLET BY MOUTH DAILY 90 tablet 3   calcium carbonate (OSCAL) 1500 (600 Ca) MG TABS tablet Take 600 mg of elemental calcium by mouth daily.     cholecalciferol (VITAMIN D3) 25 MCG (1000 UNIT) tablet Take 1,000 Units by mouth daily.     COD LIVER OIL PO Take 1 capsule by mouth daily.  Continuous Blood Gluc Sensor (FREESTYLE LIBRE 14 DAY SENSOR) MISC CHANGE EVERY 14 DAYS TO MONITOR BLOOD GLUCOSE     gabapentin (NEURONTIN) 300 MG capsule Take 1 capsule (300 mg total) by mouth at bedtime. 30 capsule 2   HUMALOG 100 UNIT/ML injection Inject 15-20 Units into the skin 3 (three) times daily with meals. Sliding scale  Depending on carb intake     insulin glargine (LANTUS) 100 UNIT/ML injection Inject 17 Units into the skin 2 (two) times daily.     levofloxacin (LEVAQUIN) 750 MG tablet Take 1 tablet (750 mg total) by mouth daily. 10 tablet 0   losartan (COZAAR) 25 MG tablet Take 25 mg by mouth daily.     Magnesium 500 MG CAPS Take 500 mg by mouth 2 (two) times daily.     nitroGLYCERIN (NITROSTAT) 0.4 MG SL tablet DISSOLVE 1 TABLET UNDER THE TONGUE EVERY 5 MINUTES AS  NEEDED FOR CHEST PAIN. MAX  OF 3 TABLETS IN 15 MINUTES. CALL 911 IF PAIN PERSISTS. (Patient taking differently: Place 0.4 mg under the tongue every 5 (five) minutes as needed for chest pain.) 100 tablet 3   Omega-3 Fatty Acids (FISH OIL PO) Take 5,000 mg by mouth daily.     OVER THE COUNTER MEDICATION Take 1 capsule by mouth 2 (two) times daily. Nitric oxide blood flow supplement     pentoxifylline (TRENTAL) 400 MG CR tablet Take 1 tablet (400 mg total) by mouth 3 (three) times daily with meals. 90 tablet 0   Probiotic Product (ALIGN) 4 MG CAPS Take 4 mg  by mouth daily.     ranolazine (RANEXA) 500 MG 12 hr tablet Take 250 mg by mouth at bedtime.     rivaroxaban (XARELTO) 2.5 MG TABS tablet Take 1 tablet (2.5 mg total) by mouth 2 (two) times daily. 180 tablet 1   SANTYL 250 UNIT/GM ointment Apply 1 Application topically 2 (two) times daily.     simvastatin (ZOCOR) 20 MG tablet TAKE 1 TABLET BY MOUTH AT BEDTIME 90 tablet 2   sulfamethoxazole-trimethoprim (BACTRIM DS) 800-160 MG tablet Take 1 tablet by mouth 2 (two) times daily.     TURMERIC CURCUMIN PO Take 1,500 mg by mouth 2 (two) times daily.     varenicline (CHANTIX) 0.5 MG tablet Take 0.5 mg by mouth daily.     vitamin B-12 (CYANOCOBALAMIN) 500 MCG tablet Take 500 mcg by mouth daily.     vitamin E 180 MG (400 UNITS) capsule Take 400 Units by mouth daily.     No current facility-administered medications for this visit.    REVIEW OF SYSTEMS:  [X]  denotes positive finding, [ ]  denotes negative finding Cardiac  Comments:  Chest pain or chest pressure:    Shortness of breath upon exertion:    Short of breath when lying flat:    Irregular heart rhythm:        Vascular    Pain in calf, thigh, or hip brought on by ambulation:    Pain in feet at night that wakes you up from your sleep:     Blood clot in your veins:    Leg swelling:         Pulmonary    Oxygen at home:    Productive cough:     Wheezing:         Neurologic    Sudden weakness in arms or legs:     Sudden numbness in arms or legs:     Sudden onset of difficulty  speaking or slurred speech:    Temporary loss of vision in one eye:     Problems with dizziness:         Gastrointestinal    Blood in stool:     Vomited blood:         Genitourinary    Burning when urinating:     Blood in urine:        Psychiatric    Major depression:         Hematologic    Bleeding problems:    Problems with blood clotting too easily:        Skin    Rashes or ulcers:        Constitutional    Fever or chills:      PHYSICAL  EXAM: Vitals:   06/30/23 1336  BP: 136/67  Pulse: 79  Temp: 98.1 F (36.7 C)  TempSrc: Temporal  SpO2: 96%  Weight: 211 lb (95.7 kg)  Height: 5\' 8"  (1.727 m)     GENERAL: The patient is a well-nourished male, in no acute distress. The vital signs are documented above. CARDIAC: There is a regular rate and rhythm.  VASCULAR:  Palpable femoral pulses bilaterally No palpable pedal pulses Left achilles wound pictured     DATA:   Left leg arterial duplex does not show any flow-limiting stenosis that is high-grade  Assessment/Plan:  57 y.o. male, with hx CAD, CKD, COPD, DM, HLD, tobacco abuse that presents for interval wound check of left achilles wound. This wound started about early May.  We did an angiogram on 01/15/2023 showing two-vessel runoff in the anterior tibial and peroneal and no intervention was performed and he had in-line flow.  He has since had debridement and grafting with Dr. [name] including Darlen Round.  I do think this looks much better than his prior visit.   I think I think the only thing to offer would be another angiogram and possible AT intervention on the distal vessel where he had some moderate disease but with the wound showing improvement I am taking a more conservative approach.   Cephus Shelling, MD [facility] Office: [phone]

## 2023-07-08 ENCOUNTER — Encounter: Payer: Self-pay | Admitting: Family

## 2023-07-08 ENCOUNTER — Ambulatory Visit: Payer: 59 | Admitting: Family

## 2023-07-08 DIAGNOSIS — L97423 Non-pressure chronic ulcer of left heel and midfoot with necrosis of muscle: Secondary | ICD-10-CM

## 2023-07-08 NOTE — Progress Notes (Signed)
Post-Op Visit Note   Patient: [name]           Date of Birth: Apr 04, 1966           MRN: [id number] Visit Date: 07/08/2023 PCP: Garlan Fillers, MD  Chief Complaint:  Chief Complaint  Patient presents with   Left Foot - Wound Check    HPI:  HPI The patient is a 57 year old gentleman who is seen in follow-up status post left Achilles debridement on July 31 he has had Kerecis micro powder applied in the office, donated, biweekly for last 1 month.  Has returned to work for [employer] duty.  He also continues with home exercise program as well as hyperbaric oxygen treatments, has quit smoking.     Ortho Exam On examination of the left Achilles area of the ulcer is about 50% healed this was debrided manually of scab.  Underlying ulcer is now 3 mm in diameter 1 mm of depth   No erythema or warmth.  Cleansed with Vashe.  Donation Kerecis micro powder applied again today.  Visit Diagnoses: No diagnosis found.  Plan: Kerecis micro powder donation reapplied today.  Will change overlying gauze daily as instructed daily for the first week he will follow-up in the office in 1 more week for reevaluation  Follow-Up Instructions: No follow-ups on file.   Imaging: No results found.  Orders:  No orders of the defined types were placed in this encounter.  No orders of the defined types were placed in this encounter.    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 03/22/2020   Osteopenia of multiple sites 02/28/2020   PVD (peripheral vascular disease) (HCC) 10/11/2019   Chronic venous insufficiency 10/05/2018   Diabetic cataract (HCC) 05/21/2018   Controlled type 1 diabetes mellitus with diabetic peripheral angiopathy without gangrene (HCC)  08/14/2017   Dyslipidemia 03/26/2017   High risk medication use 09/23/2016   History of hypertension 09/23/2016   History of chronic kidney disease 09/23/2016   History of coronary artery disease 09/23/2016   Tobacco abuse 09/23/2016   Primary osteoarthritis of both knees 09/23/2016   History of diabetes mellitus 09/23/2016   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 (HCC) 11/20/2015   Chest pain with high risk for cardiac etiology 10/29/2015   Asymptomatic bilateral carotid artery stenosis 06/08/2014   Pleural effusion, bilateral 06/03/2014   Dyspnea 06/01/2014   Diabetes (HCC) 05/03/2014   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/03/2014   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   Chronic renal disease, stage 3, moderately decreased glomerular filtration rate between 30-59 mL/min/1.73 square meter (HCC) 05/03/2014   CAD (coronary artery disease) 04/27/2014   Acne 12/19/2013   On isotretinoin therapy 12/19/2013   Nuclear sclerotic cataract, bilateral 12/19/2011   Vitreous hemorrhage (HCC) 06/16/2011   Past Medical History:  Diagnosis Date   Anginal pain (HCC)    Anxiety    Arthritis    RA IN HANDS   Asthma    CAD (coronary artery disease) 04/27/2014   Chronic renal disease, stage 3, moderately decreased glomerular filtration rate between 30-59 mL/min/1.73 square meter (HCC) 05/03/2014   COPD (chronic obstructive pulmonary disease) (HCC)    Coronary  artery disease    Diabetes (HCC) 05/03/2014   Diabetes mellitus without complication (HCC)    type 1   Hyperlipidemia    Left shoulder pain    Peripheral vascular disease (HCC)    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/03/2014   Shortness of breath    Sleep apnea    mild OSA, no CPAP   Unstable angina pectoris (HCC) 04/25/2014    Family History  Problem Relation Age of Onset   Stomach cancer Mother    Cancer  Mother    Prostate cancer Father    Heart disease Father    Cancer - Prostate Father    Heart disease Brother    Asthma Daughter    Healthy Daughter    Asthma Daughter     Past Surgical History:  Procedure Laterality Date   ABDOMINAL AORTOGRAM W/LOWER EXTREMITY N/A 01/15/2023   Procedure: ABDOMINAL AORTOGRAM W/LOWER EXTREMITY;  Surgeon: Cephus Shelling, MD;  Location: [facility] INVASIVE CV LAB;  Service: Cardiovascular;  Laterality: N/A;   CARDIAC CATHETERIZATION  04/25/2014   BY DR Jacinto Halim   CARDIAC CATHETERIZATION N/A 10/30/2015   Procedure: Left Heart Cath and Cors/Grafts Angiography;  Surgeon: Yates Decamp, MD;  Location: [facility] INVASIVE CV LAB;  Service: Cardiovascular;  Laterality: N/A;   CARPAL TUNNEL RELEASE     CATARACT EXTRACTION, BILATERAL     CORONARY ARTERY BYPASS GRAFT N/A 04/27/2014   Procedure: CORONARY ARTERY BYPASS GRAFTING on pump using left internal mammary artery to LAD coronary artery, right great saphenous vein graft to diagonal coronary artery with sequential to OM1 and circumflex coronary arteries. Right greater saphenous vein graft to posterior descending coronary artery. ;  Surgeon: Delight Ovens, MD;  Location: [facility] OR;  Service: Open Heart Surgery;  Laterality: N   elbow drained Left 05/09/2019   ENDOVEIN HARVEST OF GREATER SAPHENOUS VEIN Right 04/27/2014   Procedure: ENDOVEIN HARVEST OF GREATER SAPHENOUS VEIN;  Surgeon: Delight Ovens, MD;  Location: [facility] OR;  Service: Open Heart Surgery;  Laterality: Right;   EXCISION ORAL TUMOR N/A 03/01/2018   Procedure: EXCISION ORAL TUMOR;  Surgeon: Christia Reading, MD;  Location: [facility];  Service: ENT;  Laterality: N/A;   EYE SURGERY     LASER   EYE SURGERY Bilateral    astigmatism correction    HARDWARE REMOVAL Left 10/03/2020   Procedure: LEFT FOOT REMOVAL HARDWARE, PLACE VANC POWDER;  Surgeon: Nadara Mustard, MD;  Location: [facility] OR;  Service: Orthopedics;  Laterality: Left;   I & D EXTREMITY Left 03/04/2023    Procedure: LEFT ACHILLES DEBRIDEMENT;  Surgeon: Nadara Mustard, MD;  Location: [facility] OR;  Service: Orthopedics;  Laterality: Left;   INTRAOPERATIVE TRANSESOPHAGEAL ECHOCARDIOGRAM N/A 04/27/2014   Procedure: INTRAOPERATIVE TRANSESOPHAGEAL ECHOCARDIOGRAM;  Surgeon: Delight Ovens, MD;  Location: [facility] OR;  Service: Open Heart Surgery;  Laterality: N/A;   IR RADIOLOGIST EVAL & MGMT  12/22/2022   LEFT HEART CATHETERIZATION WITH CORONARY ANGIOGRAM N/A 04/25/2014   Procedure: LEFT HEART CATHETERIZATION WITH CORONARY ANGIOGRAM;  Surgeon: Pamella Pert, MD;  Location: [facility] CATH LAB;  Service: Cardiovascular;  Laterality: N/A;   MOUTH SURGERY  11/15/2017   tongue surgery    ORIF TOE FRACTURE Left 03/22/2020   Procedure: OPEN REDUCTION INTERNAL FIXATION (ORIF) Non Union 5th Metatarsal;  Surgeon: Kathryne Hitch, MD;  Location: [facility];  Service: Orthopedics;  Laterality: Left;   Social History   Occupational History   Not on file  Tobacco  Use   Smoking status: Former    Current packs/day: 0.25    Average packs/day: 0.3 packs/day for 34.0 years (8.5 ttl pk-yrs)    Types: Cigarettes   Smokeless tobacco: Never  Vaping Use   Vaping status: Never Used  Substance and Sexual Activity   Alcohol use: Not Currently    Comment: RARE   Drug use: No   Sexual activity: Not on file

## 2023-07-08 NOTE — Addendum Note (Signed)
Addended by: Barnie Del R on: 07/08/2023 04:04 PM   Modules accepted: Orders

## 2023-07-16 ENCOUNTER — Ambulatory Visit
Admission: RE | Admit: 2023-07-16 | Discharge: 2023-07-16 | Disposition: A | Discharge status: Home / Self Care | Payer: 59 | Source: Ambulatory Visit | Attending: Family | Admitting: Family

## 2023-07-16 DIAGNOSIS — L97423 Non-pressure chronic ulcer of left heel and midfoot with necrosis of muscle: Secondary | ICD-10-CM

## 2023-07-22 ENCOUNTER — Encounter: Payer: Self-pay | Admitting: Family

## 2023-07-22 ENCOUNTER — Ambulatory Visit: Payer: 59 | Admitting: Family

## 2023-07-22 DIAGNOSIS — T847XXD Infection and inflammatory reaction due to other internal orthopedic prosthetic devices, implants and grafts, subsequent encounter: Secondary | ICD-10-CM | POA: Diagnosis not present

## 2023-07-22 NOTE — Progress Notes (Signed)
Post-Op Visit Note   Patient: [name]           Date of Birth: April 05, 1966           MRN: [id number] Visit Date: 07/22/2023 PCP: Garlan Fillers, MD  Chief Complaint:  Chief Complaint  Patient presents with   Left Foot - Wound Check    HPI:  HPI The patient is a 57 year old gentleman who is seen in follow-up status post left Achilles debridement on July 31 he has had Kerecis micro powder applied in the office, donated, biweekly for last 1 month.  Has returned to work for [employer] duty.  He also continues with home exercise program as well as hyperbaric oxygen treatments, has quit smoking.   Has had remarkable improvement in the ulcer.   Ortho Exam On examination of the left Achilles area of the ulcer is fully healed there is an eschar which is 2 mm in diameter this was not unroofed today.  There is no surrounding erythema   No erythema or warmth.  Visit Diagnoses: No diagnosis found.  Plan: Will follow-up with Dr. [name] in a few more weeks.  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  Has had recent CT scan   Follow-Up Instructions: Return in about 22 days (around 08/13/2023).   Imaging: No results found.  Orders:  No orders of the defined types were placed in this encounter.  No orders of the defined types were placed in this encounter.    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 03/22/2020   Osteopenia of multiple sites 02/28/2020   PVD (peripheral vascular disease) (HCC) 10/11/2019   Chronic venous insufficiency 10/05/2018   Diabetic cataract (HCC) 05/21/2018   Controlled type 1 diabetes mellitus with diabetic peripheral angiopathy without gangrene  (HCC) 08/14/2017   Dyslipidemia 03/26/2017   High risk medication use 09/23/2016   History of hypertension 09/23/2016   History of chronic kidney disease 09/23/2016   History of coronary artery disease 09/23/2016   Tobacco abuse 09/23/2016   Primary osteoarthritis of both knees 09/23/2016   History of diabetes mellitus 09/23/2016   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 (HCC) 11/20/2015   Chest pain with high risk for cardiac etiology 10/29/2015   Asymptomatic bilateral carotid artery stenosis 06/08/2014   Pleural effusion, bilateral 06/03/2014   Dyspnea 06/01/2014   Diabetes (HCC) 05/03/2014   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/03/2014   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   Chronic renal disease, stage 3, moderately decreased glomerular filtration rate between 30-59 mL/min/1.73 square meter (HCC) 05/03/2014   CAD (coronary artery disease) 04/27/2014   Acne 12/19/2013   On isotretinoin therapy 12/19/2013   Nuclear sclerotic cataract, bilateral 12/19/2011   Vitreous hemorrhage (HCC) 06/16/2011   Past Medical History:  Diagnosis Date   Anginal pain (HCC)    Anxiety    Arthritis    RA IN HANDS   Asthma    CAD (coronary artery disease) 04/27/2014   Chronic renal disease, stage 3, moderately decreased glomerular filtration rate between 30-59 mL/min/1.73 square meter (HCC) 05/03/2014   COPD (chronic obstructive pulmonary disease) (HCC)  Coronary artery disease    Diabetes (HCC) 05/03/2014   Diabetes mellitus without complication (HCC)    type 1   Hyperlipidemia    Left shoulder pain    Peripheral vascular disease (HCC)    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/03/2014   Shortness of breath    Sleep apnea    mild OSA, no CPAP   Unstable angina pectoris (HCC) 04/25/2014    Family History  Problem Relation Age of Onset   Stomach cancer Mother     Cancer Mother    Prostate cancer Father    Heart disease Father    Cancer - Prostate Father    Heart disease Brother    Asthma Daughter    Healthy Daughter    Asthma Daughter     Past Surgical History:  Procedure Laterality Date   ABDOMINAL AORTOGRAM W/LOWER EXTREMITY N/A 01/15/2023   Procedure: ABDOMINAL AORTOGRAM W/LOWER EXTREMITY;  Surgeon: Cephus Shelling, MD;  Location: [facility] LAB;  Service: Cardiovascular;  Laterality: N/A;   CARDIAC CATHETERIZATION  04/25/2014   BY DR Jacinto Halim   CARDIAC CATHETERIZATION N/A 10/30/2015   Procedure: Left Heart Cath and Cors/Grafts Angiography;  Surgeon: Yates Decamp, MD;  Location: [facility] INVASIVE CV LAB;  Service: Cardiovascular;  Laterality: N/A;   CARPAL TUNNEL RELEASE     CATARACT EXTRACTION, BILATERAL     CORONARY ARTERY BYPASS GRAFT N/A 04/27/2014   Procedure: CORONARY ARTERY BYPASS GRAFTING on pump using left internal mammary artery to LAD coronary artery, right great saphenous vein graft to diagonal coronary artery with sequential to OM1 and circumflex coronary arteries. Right greater saphenous vein graft to posterior descending coronary artery. ;  Surgeon: Delight Ovens, MD;  Location: [facility] OR;  Service: Open Heart Surgery;  Laterality: N   elbow drained Left 05/09/2019   ENDOVEIN HARVEST OF GREATER SAPHENOUS VEIN Right 04/27/2014   Procedure: ENDOVEIN HARVEST OF GREATER SAPHENOUS VEIN;  Surgeon: Delight Ovens, MD;  Location: [facility] OR;  Service: Open Heart Surgery;  Laterality: Right;   EXCISION ORAL TUMOR N/A 03/01/2018   Procedure: EXCISION ORAL TUMOR;  Surgeon: Christia Reading, MD;  Location: [facility];  Service: ENT;  Laterality: N/A;   EYE SURGERY     LASER   EYE SURGERY Bilateral    astigmatism correction    HARDWARE REMOVAL Left 10/03/2020   Procedure: LEFT FOOT REMOVAL HARDWARE, PLACE VANC POWDER;  Surgeon: Nadara Mustard, MD;  Location: [facility] OR;  Service: Orthopedics;  Laterality: Left;   I & D EXTREMITY Left 03/04/2023    Procedure: LEFT ACHILLES DEBRIDEMENT;  Surgeon: Nadara Mustard, MD;  Location: [facility] OR;  Service: Orthopedics;  Laterality: Left;   INTRAOPERATIVE TRANSESOPHAGEAL ECHOCARDIOGRAM N/A 04/27/2014   Procedure: INTRAOPERATIVE TRANSESOPHAGEAL ECHOCARDIOGRAM;  Surgeon: Delight Ovens, MD;  Location: [facility] OR;  Service: Open Heart Surgery;  Laterality: N/A;   IR RADIOLOGIST EVAL & MGMT  12/22/2022   LEFT HEART CATHETERIZATION WITH CORONARY ANGIOGRAM N/A 04/25/2014   Procedure: LEFT HEART CATHETERIZATION WITH CORONARY ANGIOGRAM;  Surgeon: Pamella Pert, MD;  Location: [facility] CATH LAB;  Service: Cardiovascular;  Laterality: N/A;   MOUTH SURGERY  11/15/2017   tongue surgery    ORIF TOE FRACTURE Left 03/22/2020   Procedure: OPEN REDUCTION INTERNAL FIXATION (ORIF) Non Union 5th Metatarsal;  Surgeon: Kathryne Hitch, MD;  Location:  SURGERY CENTER;  Service: Orthopedics;  Laterality: Left;   Social History   Occupational History   Not on file  Tobacco Use   Smoking status: Former    Current packs/day: 0.25    Average packs/day: 0.3 packs/day for 34.0 years (8.5 ttl pk-yrs)    Types: Cigarettes   Smokeless tobacco: Never  Vaping Use   Vaping status: Never Used  Substance and Sexual Activity   Alcohol use: Not Currently    Comment: RARE   Drug use: No   Sexual activity: Not on file

## 2023-07-31 ENCOUNTER — Other Ambulatory Visit: Payer: Self-pay | Admitting: Orthopedic Surgery

## 2023-08-04 ENCOUNTER — Encounter: Payer: Self-pay | Admitting: Podiatry

## 2023-08-04 ENCOUNTER — Ambulatory Visit (INDEPENDENT_AMBULATORY_CARE_PROVIDER_SITE_OTHER): Payer: 59 | Admitting: Podiatry

## 2023-08-04 DIAGNOSIS — M79676 Pain in unspecified toe(s): Secondary | ICD-10-CM

## 2023-08-04 DIAGNOSIS — B351 Tinea unguium: Secondary | ICD-10-CM | POA: Diagnosis not present

## 2023-08-04 DIAGNOSIS — E1042 Type 1 diabetes mellitus with diabetic polyneuropathy: Secondary | ICD-10-CM | POA: Diagnosis not present

## 2023-08-04 NOTE — Progress Notes (Signed)
 He presents today for his diabetic footcare and his nail debridement his A1c is at 6.3 and he is doing really well.  States that he finally healed the wound on the posterior aspect of his Achilles left.  Is questioning whether or not he should have anything done his fifth metatarsal base which was just recently CT to and diagnosed as a nonunion.  Objective: Vital signs stable alert oriented x 3 toenails are long thick yellow dystrophic with mycotic no open lesions or wounds.  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  Peripheral vascular disease.  Assessment: Peripheral vascular disease diabetic peripheral neuropathy slow healing wound posterior aspect pain in limb secondary onychomycosis.  Plan: Debridement of toenails 1 through 5 bilateral I recommended that he not do any type of surgery to the fifth metatarsal just because of his history of smoking diabetes and his calcification.

## 2023-08-25 ENCOUNTER — Ambulatory Visit: Payer: 59 | Admitting: Orthopedic Surgery

## 2023-08-25 ENCOUNTER — Encounter: Payer: Self-pay | Admitting: Orthopedic Surgery

## 2023-08-25 DIAGNOSIS — S99192G Other physeal fracture of left metatarsal, subsequent encounter for fracture with delayed healing: Secondary | ICD-10-CM | POA: Diagnosis not present

## 2023-08-25 NOTE — Progress Notes (Signed)
Office Visit Note   Patient: [name]           Date of Birth: [birth year]           MRN: [id number] Visit Date: 08/25/2023              Requested by: Garlan Fillers, MD [address] [place],  [place] [address] PCP: Garlan Fillers, MD  Chief Complaint  Patient presents with   Left Foot - Follow-up    CT scan review       HPI: Patient is a 58 year old gentleman who presents in follow-up for his left foot status post CT scan.  Patient states he has stopped smoking he states he occasionally has pain with increased activities.  He is currently wearing a new balance sneaker with a carbon plate to decrease stress across the midfoot.  Patient has healed a ulcer over the posterior heel.  Assessment & Plan: Visit Diagnoses:  1. Closed fracture of base of fifth metatarsal bone of left foot at metaphyseal-diaphyseal junction with delayed healing, subsequent encounter     Plan: Discussed with the patient he does have some fibrous union of the base of the fifth metatarsal fracture.  Discussed that with his history of tobacco use and diabetes revision fusion surgery of the base of the fifth metatarsal would have increased risk of complication.  Recommend continue conservative treatment and would proceed with surgery if patient is symptomatic at all times.  Follow-Up Instructions: Return if symptoms worsen or fail to improve.   Ortho Exam  Patient is alert, oriented, no adenopathy, well-dressed, normal affect, normal respiratory effort. Examination patient has good stiff French Kendra balance sneakers.  Review of the CT scan shows a mostly fibrous union of the base of the fifth metatarsal.  There is some bony consolidation.  Imaging: No results found. No images are attached to the encounter.  Labs: Lab Results  Component Value Date   HGBA1C 7.4 (H) 04/25/2014   HGBA1C 7.4 (H) 04/25/2014   ESRSEDRATE 9 09/26/2019   ESRSEDRATE 12 09/09/2017   ESRSEDRATE 58 (H) 06/01/2014    REPTSTATUS 03/09/2023 FINAL 03/04/2023   GRAMSTAIN NO WBC SEEN NO ORGANISMS SEEN  03/04/2023   CULT  03/04/2023    RARE STENOTROPHOMONAS MALTOPHILIA NO ANAEROBES ISOLATED Performed at [facility], [address] [place], [place] [address]    LABORGA STENOTROPHOMONAS MALTOPHILIA 03/04/2023     Lab Results  Component Value Date   ALBUMIN 3.7 03/15/2018   ALBUMIN 3.7 09/09/2017   ALBUMIN 4.2 03/30/2017    Lab Results  Component Value Date   MG 2.3 04/28/2014   MG 2.4 04/28/2014   MG 2.7 (H) 04/27/2014   Lab Results  Component Value Date   VD25OH 57 06/22/2019    No results found for: "PREALBUMIN"    Latest Ref Rng & Units 03/04/2023    7:00 AM 01/15/2023    1:35 PM 04/01/2021    3:37 PM  CBC EXTENDED  WBC 4.0 - 10.5 K/uL 11.4     RBC 4.22 - 5.81 MIL/uL 3.74     Hemoglobin 13.0 - 17.0 g/dL 52.8  41.3  24.4   HCT 39.0 - 52.0 % 35.6  38.0  33.9   Platelets 150 - 400 K/uL 267        There is no height or weight on file to calculate BMI.  Orders:  No orders of the defined types were placed in this encounter.  No orders of the  defined types were placed in this encounter.    Procedures: No procedures performed  Clinical Data: No additional findings.  ROS:  All other systems negative, except as noted in the HPI. Review of Systems  Objective: Vital Signs: There were no vitals taken for this visit.  Specialty Comments:  No specialty comments available.  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 03/22/2020   Osteopenia of multiple sites 02/28/2020   PVD (peripheral vascular disease) (HCC) 10/11/2019   Chronic venous insufficiency 10/05/2018   Diabetic cataract (HCC) 05/21/2018   Controlled type 1 diabetes mellitus  with diabetic peripheral angiopathy without gangrene (HCC) 08/14/2017   Dyslipidemia 03/26/2017   High risk medication use 09/23/2016   History of hypertension 09/23/2016   History of chronic kidney disease 09/23/2016   History of coronary artery disease 09/23/2016   Tobacco abuse 09/23/2016   Primary osteoarthritis of both knees 09/23/2016   History of diabetes mellitus 09/23/2016   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 (HCC) 11/20/2015   Chest pain with high risk for cardiac etiology 10/29/2015   Asymptomatic bilateral carotid artery stenosis 06/08/2014   Pleural effusion, bilateral 06/03/2014   Dyspnea 06/01/2014   Diabetes (HCC) 05/03/2014   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/03/2014   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   Chronic renal disease, stage 3, moderately decreased glomerular filtration rate between 30-59 mL/min/1.73 square meter (HCC) 05/03/2014   CAD (coronary artery disease) 04/27/2014   Acne 12/19/2013   On isotretinoin therapy 12/19/2013   Nuclear sclerotic cataract, bilateral 12/19/2011   Vitreous hemorrhage (HCC) 06/16/2011   Past Medical History:  Diagnosis Date   Anginal pain (HCC)    Anxiety    Arthritis    RA IN HANDS   Asthma    CAD (coronary artery disease) 04/27/2014   Chronic renal disease, stage 3, moderately decreased glomerular filtration rate between 30-59 mL/min/1.73 square meter (HCC) 05/03/2014   COPD (chronic obstructive pulmonary disease) (HCC)    Coronary artery disease    Diabetes (HCC) 05/03/2014   Diabetes mellitus without complication (HCC)    type 1   Hyperlipidemia    Left shoulder pain    Peripheral vascular disease (HCC)    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/03/2014   Shortness of breath    Sleep apnea    mild OSA, no CPAP   Unstable angina pectoris (HCC) 04/25/2014    Family History  Problem  Relation Age of Onset   Stomach cancer Mother    Cancer Mother    Prostate cancer Father    Heart disease Father    Cancer - Prostate Father    Heart disease Brother    Asthma Daughter    Healthy Daughter    Asthma Daughter     Past Surgical History:  Procedure Laterality Date   ABDOMINAL AORTOGRAM W/LOWER EXTREMITY N/A 01/15/2023   Procedure: ABDOMINAL AORTOGRAM W/LOWER EXTREMITY;  Surgeon: Cephus Shelling, MD;  Location: [facility] CV LAB;  Service: Cardiovascular;  Laterality: N/A;   CARDIAC CATHETERIZATION  04/25/2014   BY DR Jacinto Halim   CARDIAC CATHETERIZATION N/A 10/30/2015   Procedure: Left Heart Cath and Cors/Grafts Angiography;  Surgeon: Yates Decamp, MD;  Location: [facility] LAB;  Service: Cardiovascular;  Laterality: N/A;   CARPAL TUNNEL RELEASE     CATARACT EXTRACTION, BILATERAL     CORONARY ARTERY BYPASS GRAFT N/A 04/27/2014   Procedure: CORONARY ARTERY BYPASS GRAFTING on pump using left internal mammary artery to LAD coronary artery, right great saphenous vein graft to diagonal coronary artery with sequential to OM1 and circumflex coronary arteries. Right greater saphenous vein graft to posterior descending coronary artery. ;  Surgeon: Delight Ovens, MD;  Location: [facility] OR;  Service: Open Heart Surgery;  Laterality: N   elbow drained Left 05/09/2019   ENDOVEIN HARVEST OF GREATER SAPHENOUS VEIN Right 04/27/2014   Procedure: ENDOVEIN HARVEST OF GREATER SAPHENOUS VEIN;  Surgeon: Delight Ovens, MD;  Location: [facility] OR;  Service: Open Heart Surgery;  Laterality: Right;   EXCISION ORAL TUMOR N/A 03/01/2018   Procedure: EXCISION ORAL TUMOR;  Surgeon: Christia Reading, MD;  Location: [facility];  Service: ENT;  Laterality: N/A;   EYE SURGERY     LASER   EYE SURGERY Bilateral    astigmatism correction    HARDWARE REMOVAL Left 10/03/2020   Procedure: LEFT FOOT REMOVAL HARDWARE, PLACE VANC POWDER;  Surgeon: Nadara Mustard, MD;  Location: [facility] OR;  Service: Orthopedics;   Laterality: Left;   I & D EXTREMITY Left 03/04/2023   Procedure: LEFT ACHILLES DEBRIDEMENT;  Surgeon: Nadara Mustard, MD;  Location: [facility] OR;  Service: Orthopedics;  Laterality: Left;   INTRAOPERATIVE TRANSESOPHAGEAL ECHOCARDIOGRAM N/A 04/27/2014   Procedure: INTRAOPERATIVE TRANSESOPHAGEAL ECHOCARDIOGRAM;  Surgeon: Delight Ovens, MD;  Location: [facility] OR;  Service: Open Heart Surgery;  Laterality: N/A;   IR RADIOLOGIST EVAL & MGMT  12/22/2022   LEFT HEART CATHETERIZATION WITH CORONARY ANGIOGRAM N/A 04/25/2014   Procedure: LEFT HEART CATHETERIZATION WITH CORONARY ANGIOGRAM;  Surgeon: Pamella Pert, MD;  Location: [facility] CATH LAB;  Service: Cardiovascular;  Laterality: N/A;   MOUTH SURGERY  11/15/2017   tongue surgery    ORIF TOE FRACTURE Left 03/22/2020   Procedure: OPEN REDUCTION INTERNAL FIXATION (ORIF) Non Union 5th Metatarsal;  Surgeon: Kathryne Hitch, MD;  Location: [facility];  Service: Orthopedics;  Laterality: Left;   Social History   Occupational History   Not on file  Tobacco Use   Smoking status: Former    Current packs/day: 0.25    Average packs/day: 0.3 packs/day for 34.0 years (8.5 ttl pk-yrs)    Types: Cigarettes   Smokeless tobacco: Never  Vaping Use   Vaping status: Never Used  Substance and Sexual Activity   Alcohol use: Not Currently    Comment: RARE   Drug use: No   Sexual activity: Not on file

## 2023-08-26 ENCOUNTER — Other Ambulatory Visit: Payer: Self-pay | Admitting: Orthopedic Surgery

## 2023-09-08 ENCOUNTER — Encounter (HOSPITAL_COMMUNITY): Payer: 59

## 2023-09-10 ENCOUNTER — Other Ambulatory Visit: Payer: Self-pay | Admitting: Cardiology

## 2023-09-11 ENCOUNTER — Other Ambulatory Visit: Payer: Self-pay | Admitting: Cardiology

## 2023-09-11 NOTE — Telephone Encounter (Signed)
 Pt's pharmacy is requesting a refill for bupropion . Would Dr. Berry Bristol like to refill this non cardiac medication? Please address

## 2023-09-21 ENCOUNTER — Other Ambulatory Visit: Payer: 59

## 2023-09-22 ENCOUNTER — Ambulatory Visit (HOSPITAL_COMMUNITY)
Admission: RE | Admit: 2023-09-22 | Discharge: 2023-09-22 | Disposition: A | Discharge status: Home / Self Care | Payer: 59 | Source: Ambulatory Visit | Attending: Cardiovascular Disease | Admitting: Cardiovascular Disease

## 2023-09-22 DIAGNOSIS — I6523 Occlusion and stenosis of bilateral carotid arteries: Secondary | ICD-10-CM | POA: Insufficient documentation

## 2023-09-22 NOTE — Progress Notes (Signed)
 Carotid artery plaques 09/22/2023: Bilateral 1 to 39% ICA stenosis with heterogenous plaque. Bilateral antegrade vertebral artery flow.  Normal flow bilateral subclavian arteries.  Will discuss in office visit soon

## 2023-09-24 LAB — LAB REPORT - SCANNED: Creatinine, POC: 93.8 mg/dL

## 2023-09-25 ENCOUNTER — Other Ambulatory Visit: Payer: Self-pay | Admitting: Family

## 2023-09-28 NOTE — Progress Notes (Unsigned)
 Patient name: [name] MRN: [id number] DOB: Jun 24, 1966 Sex: male  REASON FOR CONSULT: 3 month follow-up, wound check  HPI: [name] is a 58 y.o. male, with hx CAD, CKD, COPD, DM, HLD, tobacco abuse that presents for 24-month follow-up for wound check of left achilles wound.  This wound started about early May 2024.  He did have ABIs on 12/31/2022 that were 1.16 on the right triphasic and noncompressible on the left triphasic.  He did have a marginal toe pressure of 58 on the left.  We previously performed a left lower extremity angiogram on 01/15/2023 showing two-vessel runoff in the anterior tibial and peroneal with inline flow.    Has had Kerecis grafting and debridement with Dr. [name].    The left Achilles wound is now healed on evaluation today.  He is walking a mile a day on a treadmill 5 days a week.  Has quit smoking.   Past Medical History:  Diagnosis Date   Anginal pain (HCC)    Anxiety    Arthritis    RA IN HANDS   Asthma    CAD (coronary artery disease) 04/27/2014   Chronic renal disease, stage 3, moderately decreased glomerular filtration rate between 30-59 mL/min/1.73 square meter (HCC) 05/03/2014   COPD (chronic obstructive pulmonary disease) (HCC)    Coronary artery disease    Diabetes (HCC) 05/03/2014   Diabetes mellitus without complication (HCC)    type 1   Hyperlipidemia    Left shoulder pain    Peripheral vascular disease (HCC)    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/03/2014   Shortness of breath    Sleep apnea    mild OSA, no CPAP   Unstable angina pectoris (HCC) 04/25/2014    Past Surgical History:  Procedure Laterality Date   ABDOMINAL AORTOGRAM W/LOWER EXTREMITY N/A 01/15/2023   Procedure: ABDOMINAL AORTOGRAM W/LOWER EXTREMITY;  Surgeon: Cephus Shelling, MD;  Location: [facility] INVASIVE CV LAB;  Service: Cardiovascular;  Laterality: N/A;   CARDIAC CATHETERIZATION  04/25/2014   BY DR Jacinto Halim   CARDIAC CATHETERIZATION N/A  10/30/2015   Procedure: Left Heart Cath and Cors/Grafts Angiography;  Surgeon: Yates Decamp, MD;  Location: [facility] INVASIVE CV LAB;  Service: Cardiovascular;  Laterality: N/A;   CARPAL TUNNEL RELEASE     CATARACT EXTRACTION, BILATERAL     CORONARY ARTERY BYPASS GRAFT N/A 04/27/2014   Procedure: CORONARY ARTERY BYPASS GRAFTING on pump using left internal mammary artery to LAD coronary artery, right great saphenous vein graft to diagonal coronary artery with sequential to OM1 and circumflex coronary arteries. Right greater saphenous vein graft to posterior descending coronary artery. ;  Surgeon: Delight Ovens, MD;  Location: [facility] OR;  Service: Open Heart Surgery;  Laterality: N   elbow drained Left 05/09/2019   ENDOVEIN HARVEST OF GREATER SAPHENOUS VEIN Right 04/27/2014   Procedure: ENDOVEIN HARVEST OF GREATER SAPHENOUS VEIN;  Surgeon: Delight Ovens, MD;  Location: [facility] OR;  Service: Open Heart Surgery;  Laterality: Right;   EXCISION ORAL TUMOR N/A 03/01/2018   Procedure: EXCISION ORAL TUMOR;  Surgeon: Christia Reading, MD;  Location: [facility];  Service: ENT;  Laterality: N/A;   EYE SURGERY     LASER   EYE SURGERY Bilateral    astigmatism correction    HARDWARE REMOVAL Left 10/03/2020   Procedure: LEFT FOOT REMOVAL HARDWARE, PLACE VANC POWDER;  Surgeon: Nadara Mustard, MD;  Location: [facility] OR;  Service: Orthopedics;  Laterality: Left;  I & D EXTREMITY Left 03/04/2023   Procedure: LEFT ACHILLES DEBRIDEMENT;  Surgeon: Nadara Mustard, MD;  Location: [facility] OR;  Service: Orthopedics;  Laterality: Left;   INTRAOPERATIVE TRANSESOPHAGEAL ECHOCARDIOGRAM N/A 04/27/2014   Procedure: INTRAOPERATIVE TRANSESOPHAGEAL ECHOCARDIOGRAM;  Surgeon: Delight Ovens, MD;  Location: [facility] OR;  Service: Open Heart Surgery;  Laterality: N/A;   IR RADIOLOGIST EVAL & MGMT  12/22/2022   LEFT HEART CATHETERIZATION WITH CORONARY ANGIOGRAM N/A 04/25/2014   Procedure: LEFT HEART CATHETERIZATION WITH CORONARY ANGIOGRAM;  Surgeon:  Pamella Pert, MD;  Location[facility] CATH LAB;  Service: Cardiovascular;  Laterality: N/A;   MOUTH SURGERY  11/15/2017   tongue surgery    ORIF TOE FRACTURE Left 03/22/2020   Procedure: OPEN REDUCTION INTERNAL FIXATION (ORIF) Non Union 5th Metatarsal;  Surgeon: Kathryne Hitch, MD;  Location: [facility];  Service: Orthopedics;  Laterality: Left;    Family History  Problem Relation Age of Onset   Stomach cancer Mother    Cancer Mother    Prostate cancer Father    Heart disease Father    Cancer - Prostate Father    Heart disease Brother    Asthma Daughter    Healthy Daughter    Asthma Daughter     SOCIAL HISTORY: Social History   Socioeconomic History   Marital status: Divorced    Spouse name: Not on file   Number of children: 3   Years of education: Not on file   Highest education level: Not on file  Occupational History   Not on file  Tobacco Use   Smoking status: Former    Current packs/day: 0.25    Average packs/day: 0.3 packs/day for 34.0 years (8.5 ttl pk-yrs)    Types: Cigarettes   Smokeless tobacco: Never  Vaping Use   Vaping status: Never Used  Substance and Sexual Activity   Alcohol use: Not Currently    Comment: RARE   Drug use: No   Sexual activity: Not on file  Other Topics Concern   Not on file  Social History Narrative   Not on file   Social Drivers of Health   Financial Resource Strain: Not on file  Food Insecurity: Not on file  Transportation Needs: Not on file  Physical Activity: Not on file  Stress: Not on file  Social Connections: Not on file  Intimate Partner Violence: Not on file    Allergies  Allergen Reactions   Tramadol Shortness Of Breath   Metoprolol Diarrhea   Niacin And Related     night sweats    Current Outpatient Medications  Medication Sig Dispense Refill   albuterol (VENTOLIN HFA) 108 (90 Base) MCG/ACT inhaler INHALE 2 PUFFS BY MOUTH EVERY 6 HOURS AS NEEDED FOR WHEEZING 9 g 0   Ascorbic Acid  (VITAMIN C PO) Take 2,000 mg by mouth daily.     aspirin EC 81 MG tablet Take 1 tablet (81 mg total) by mouth daily. Swallow whole. 150 tablet 2   bisoprolol (ZEBETA) 5 MG tablet Take 1/2 (one-half) tablet by mouth once daily 45 tablet 2   Blood Glucose Monitoring Suppl (ONETOUCH VERIO FLEX SYSTEM) w/Device KIT USE TO CHECK BLOOD GLUCOSE UP TO 10 TIMES PER DAY     buPROPion (WELLBUTRIN SR) 150 MG 12 hr tablet TAKE 1 TABLET BY MOUTH DAILY 90 tablet 0   calcium carbonate (OSCAL) 1500 (600 Ca) MG TABS tablet Take 600 mg of elemental calcium by mouth daily.     cholecalciferol (VITAMIN  D3) 25 MCG (1000 UNIT) tablet Take 1,000 Units by mouth daily.     COD LIVER OIL PO Take 1 capsule by mouth daily.     Continuous Blood Gluc Sensor (FREESTYLE LIBRE 14 DAY SENSOR) MISC CHANGE EVERY 14 DAYS TO MONITOR BLOOD GLUCOSE     gabapentin (NEURONTIN) 300 MG capsule Take 1 capsule (300 mg total) by mouth at bedtime. 30 capsule 2   HUMALOG 100 UNIT/ML injection Inject 15-20 Units into the skin 3 (three) times daily with meals. Sliding scale  Depending on carb intake     insulin glargine (LANTUS) 100 UNIT/ML injection Inject 17 Units into the skin 2 (two) times daily.     losartan (COZAAR) 25 MG tablet Take 25 mg by mouth daily.     Magnesium 500 MG CAPS Take 500 mg by mouth 2 (two) times daily.     nitroGLYCERIN (NITROSTAT) 0.4 MG SL tablet DISSOLVE 1 TABLET UNDER THE TONGUE EVERY 5 MINUTES AS  NEEDED FOR CHEST PAIN. MAX  OF 3 TABLETS IN 15 MINUTES. CALL 911 IF PAIN PERSISTS. (Patient taking differently: Place 0.4 mg under the tongue every 5 (five) minutes as needed for chest pain.) 100 tablet 3   Omega-3 Fatty Acids (FISH OIL PO) Take 5,000 mg by mouth daily.     OVER THE COUNTER MEDICATION Take 1 capsule by mouth 2 (two) times daily. Nitric oxide blood flow supplement     pentoxifylline (TRENTAL) 400 MG CR tablet TAKE 1 TABLET BY MOUTH THREE TIMES DAILY WITH MEALS 90 tablet 0   Probiotic Product (ALIGN) 4 MG CAPS  Take 4 mg by mouth daily.     ranolazine (RANEXA) 500 MG 12 hr tablet Take 250 mg by mouth at bedtime.     rivaroxaban (XARELTO) 2.5 MG TABS tablet Take 1 tablet (2.5 mg total) by mouth 2 (two) times daily. 180 tablet 1   SANTYL 250 UNIT/GM ointment Apply 1 Application topically 2 (two) times daily.     simvastatin (ZOCOR) 20 MG tablet TAKE 1 TABLET BY MOUTH AT BEDTIME 90 tablet 2   sulfamethoxazole-trimethoprim (BACTRIM DS) 800-160 MG tablet Take 1 tablet by mouth 2 (two) times daily.     TURMERIC CURCUMIN PO Take 1,500 mg by mouth 2 (two) times daily.     varenicline (CHANTIX) 0.5 MG tablet Take 0.5 mg by mouth daily.     vitamin B-12 (CYANOCOBALAMIN) 500 MCG tablet Take 500 mcg by mouth daily.     vitamin E 180 MG (400 UNITS) capsule Take 400 Units by mouth daily.     No current facility-administered medications for this visit.    REVIEW OF SYSTEMS:  [X]  denotes positive finding, [ ]  denotes negative finding Cardiac  Comments:  Chest pain or chest pressure:    Shortness of breath upon exertion:    Short of breath when lying flat:    Irregular heart rhythm:        Vascular    Pain in calf, thigh, or hip brought on by ambulation:    Pain in feet at night that wakes you up from your sleep:     Blood clot in your veins:    Leg swelling:         Pulmonary    Oxygen at home:    Productive cough:     Wheezing:         Neurologic    Sudden weakness in arms or legs:     Sudden numbness in arms or legs:  Sudden onset of difficulty speaking or slurred speech:    Temporary loss of vision in one eye:     Problems with dizziness:         Gastrointestinal    Blood in stool:     Vomited blood:         Genitourinary    Burning when urinating:     Blood in urine:        Psychiatric    Major depression:         Hematologic    Bleeding problems:    Problems with blood clotting too easily:        Skin    Rashes or ulcers:        Constitutional    Fever or chills:       PHYSICAL EXAM: There were no vitals filed for this visit.    GENERAL: The patient is a well-nourished male, in no acute distress. The vital signs are documented above. CARDIAC: There is a regular rate and rhythm.  VASCULAR:  Palpable femoral pulses bilaterally No palpable pedal pulses Left achilles wound healed No active tissue loss at this time    DATA:   N/A  Assessment/Plan:  58 y.o. male, with hx CAD, CKD, COPD, DM, HLD, tobacco abuse that presents for 3 month follow-up for wound check of left achilles wound. This wound started about early May of 2024.  We did an angiogram on 01/15/2023 showing two-vessel runoff in the anterior tibial and peroneal and no intervention was performed and he had in-line flow.  He has since had debridement and grafting with Dr. [name] including Darlen Round.  This left achilles wound has now healed on today's visit.  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  He stopped smoking.  Now walking a mile a day 5 days a week on a treadmill.  He has no tissue loss at this time.  I will see him in 1 year with repeat ABIs for ongoing surveillance.     Cephus Shelling, MD [facility] Office: [phone]

## 2023-09-29 ENCOUNTER — Ambulatory Visit (INDEPENDENT_AMBULATORY_CARE_PROVIDER_SITE_OTHER): Payer: 59 | Admitting: Vascular Surgery

## 2023-09-29 ENCOUNTER — Encounter: Payer: Self-pay | Admitting: Vascular Surgery

## 2023-09-29 VITALS — BP 127/72 | HR 66 | Temp 98.0°F | Resp 18 | Ht 68.0 in | Wt 211.8 lb

## 2023-09-29 DIAGNOSIS — I739 Peripheral vascular disease, unspecified: Secondary | ICD-10-CM

## 2023-10-01 ENCOUNTER — Ambulatory Visit: Payer: 59 | Attending: Cardiology | Admitting: Cardiology

## 2023-10-01 ENCOUNTER — Encounter: Payer: Self-pay | Admitting: Cardiology

## 2023-10-01 VITALS — BP 138/68 | HR 74 | Resp 16 | Ht 68.0 in | Wt 209.6 lb

## 2023-10-01 DIAGNOSIS — I739 Peripheral vascular disease, unspecified: Secondary | ICD-10-CM | POA: Diagnosis not present

## 2023-10-01 DIAGNOSIS — E7800 Pure hypercholesterolemia, unspecified: Secondary | ICD-10-CM | POA: Diagnosis not present

## 2023-10-01 DIAGNOSIS — I10 Essential (primary) hypertension: Secondary | ICD-10-CM | POA: Diagnosis not present

## 2023-10-01 DIAGNOSIS — I25118 Atherosclerotic heart disease of native coronary artery with other forms of angina pectoris: Secondary | ICD-10-CM

## 2023-10-01 NOTE — Progress Notes (Signed)
 Cardiology Office Note:  .   Date:  10/01/2023  ID:  [name], DOB [birth year], MRN [id number] PCP: Garlan Fillers, MD  [facility] Providers Cardiologist:  Yates Decamp, MD   History of Present Illness: .   [name] is a 58 y.o. male  with history of Type I Diabetes with stage IIIa chronic kidney disease, rheumatoid arthritis, hyperlipidemia, and CAD S/P coronary artery bypass grafting on 04/27/2014, moderate COPD secondary to smoking, h/o GI bleed 03/2021, at which time aspirin was held, now back on antiplatelet therapy, peripheral arterial disease and has developed ulcer in his left leg at his ankle, angiography revealing occluded  PT vessel left on 01/15/2023 but with brisk flow, recommended medical therapy now presents for a 64-month office visit.  He has remained abstinent since developing leg ulcer.   Over the past several months, he has had complete healing of the wound and he has remained abstinent from tobacco use.  Diabetes also now well-controlled.  He denies chest pain or dyspnea and has been trying to exercise regularly.   Labs    Lab Results  Component Value Date   NA 135 03/04/2023   K 3.9 03/04/2023   CO2 26 03/04/2023   GLUCOSE 216 (H) 03/04/2023   BUN 24 (H) 03/04/2023   CREATININE 1.55 (H) 03/04/2023   CALCIUM 8.6 (L) 03/04/2023   GFR 59.88 (L) 06/01/2014   GFRNONAA 51 (L) 03/04/2023      Latest Ref Rng & Units 03/04/2023    7:00 AM 01/15/2023    1:35 PM 10/03/2020    6:45 AM  BMP  Glucose 70 - 99 mg/dL 253  664  403   BUN 6 - 20 mg/dL 24  26  30    Creatinine 0.61 - 1.24 mg/dL 4.74  2.59  5.63   Sodium 135 - 145 mmol/L 135  139  135   Potassium 3.5 - 5.1 mmol/L 3.9  4.4  4.4   Chloride 98 - 111 mmol/L 101  104  104   CO2 22 - 32 mmol/L 26   23   Calcium 8.9 - 10.3 mg/dL 8.6   8.6       Latest Ref Rng & Units 03/04/2023    7:00 AM 01/15/2023    1:35 PM 04/01/2021    3:37 PM  CBC  WBC 4.0 - 10.5 K/uL 11.4     Hemoglobin 13.0 - 17.0 g/dL  87.5  64.3  32.9   Hematocrit 39.0 - 52.0 % 35.6  38.0  33.9   Platelets 150 - 400 K/uL 267      Lab Results  Component Value Date   HGBA1C 7.4 (H) 04/25/2014    Lab Results  Component Value Date   TSH 2.00 06/01/2014    External Labs:    Review of Systems  Cardiovascular:  Negative for chest pain, dyspnea on exertion and leg swelling.   Physical Exam:   VS:  BP 138/68 (BP Location: Left Arm, Patient Position: Sitting, Cuff Size: Large)   Pulse 74   Resp 16   Ht 5\' 8"  (1.727 m)   Wt 209 lb 9.6 oz (95.1 kg)   SpO2 94%   BMI 31.87 kg/m    Wt Readings from Last 3 Encounters:  10/01/23 209 lb 9.6 oz (95.1 kg)  09/29/23 211 lb 12.8 oz (96.1 kg)  06/30/23 211 lb (95.7 kg)     Physical Exam Neck:     Vascular: No carotid bruit or JVD.  Cardiovascular:     Rate and Rhythm: Normal rate and regular rhythm.     Pulses:          Dorsalis pedis pulses are 0 on the right side and 0 on the left side.       Posterior tibial pulses are 0 on the right side and 0 on the left side.     Heart sounds: Normal heart sounds. No murmur heard.    No gallop.  Pulmonary:     Effort: Pulmonary effort is normal.     Breath sounds: Normal breath sounds.  Abdominal:     General: Bowel sounds are normal.     Palpations: Abdomen is soft.  Musculoskeletal:     Right lower leg: No edema.     Left lower leg: No edema.    Studies Reviewed: Marland Kitchen     CABG 04/27/2014: LIMA to LAD, SVG to D1, SVG to OM1 and distal circumflex, SVG to PDA.   Coronary angiogram 10/30/2015:    SVG to RCA patent has not been included in this hand diagram Carotid artery plaques 09/22/2023: Bilateral 1 to 39% ICA stenosis with heterogenous plaque. Bilateral antegrade vertebral artery flow.  Normal flow bilateral subclavian arteries. NO change from 09/05/22.    EKG:         Medications and allergies    Allergies  Allergen Reactions   Tramadol Shortness Of Breath   Metoprolol Diarrhea   Niacin And Related      night sweats     Current Outpatient Medications:    Ascorbic Acid (VITAMIN C PO), Take 2,000 mg by mouth daily., Disp: , Rfl:    aspirin EC 81 MG tablet, Take 1 tablet (81 mg total) by mouth daily. Swallow whole., Disp: 150 tablet, Rfl: 2   bisoprolol (ZEBETA) 5 MG tablet, Take 1/2 (one-half) tablet by mouth once daily, Disp: 45 tablet, Rfl: 2   Blood Glucose Monitoring Suppl (ONETOUCH VERIO FLEX SYSTEM) w/Device KIT, USE TO CHECK BLOOD GLUCOSE UP TO 10 TIMES PER DAY, Disp: , Rfl:    buPROPion (WELLBUTRIN SR) 150 MG 12 hr tablet, TAKE 1 TABLET BY MOUTH DAILY, Disp: 90 tablet, Rfl: 0   calcium carbonate (OSCAL) 1500 (600 Ca) MG TABS tablet, Take 600 mg of elemental calcium by mouth daily., Disp: , Rfl:    cholecalciferol (VITAMIN D3) 25 MCG (1000 UNIT) tablet, Take 1,000 Units by mouth daily., Disp: , Rfl:    COD LIVER OIL PO, Take 1 capsule by mouth daily., Disp: , Rfl:    Continuous Blood Gluc Sensor (FREESTYLE LIBRE 14 DAY SENSOR) MISC, CHANGE EVERY 14 DAYS TO MONITOR BLOOD GLUCOSE, Disp: , Rfl:    gabapentin (NEURONTIN) 300 MG capsule, Take 1 capsule (300 mg total) by mouth at bedtime., Disp: 30 capsule, Rfl: 2   HUMALOG 100 UNIT/ML injection, Inject 15-20 Units into the skin 3 (three) times daily with meals. Sliding scale  Depending on carb intake, Disp: , Rfl:    insulin glargine (LANTUS) 100 UNIT/ML injection, Inject 17 Units into the skin 2 (two) times daily., Disp: , Rfl:    losartan (COZAAR) 25 MG tablet, Take 25 mg by mouth daily., Disp: , Rfl:    Magnesium 500 MG CAPS, Take 500 mg by mouth 2 (two) times daily., Disp: , Rfl:    nitroGLYCERIN (NITROSTAT) 0.4 MG SL tablet, DISSOLVE 1 TABLET UNDER THE TONGUE EVERY 5 MINUTES AS  NEEDED FOR CHEST PAIN. MAX  OF 3 TABLETS IN 15 MINUTES. CALL 911  IF PAIN PERSISTS. (Patient taking differently: Place 0.4 mg under the tongue every 5 (five) minutes as needed for chest pain.), Disp: 100 tablet, Rfl: 3   Omega-3 Fatty Acids (FISH OIL PO), Take 5,000  mg by mouth daily., Disp: , Rfl:    OVER THE COUNTER MEDICATION, Take 1 capsule by mouth 2 (two) times daily. Nitric oxide blood flow supplement, Disp: , Rfl:    pentoxifylline (TRENTAL) 400 MG CR tablet, TAKE 1 TABLET BY MOUTH THREE TIMES DAILY WITH MEALS, Disp: 90 tablet, Rfl: 0   Probiotic Product (ALIGN) 4 MG CAPS, Take 4 mg by mouth daily., Disp: , Rfl:    ranolazine (RANEXA) 500 MG 12 hr tablet, Take 250 mg by mouth at bedtime., Disp: , Rfl:    rivaroxaban (XARELTO) 2.5 MG TABS tablet, Take 1 tablet (2.5 mg total) by mouth 2 (two) times daily., Disp: 180 tablet, Rfl: 1   SANTYL 250 UNIT/GM ointment, Apply 1 Application topically 2 (two) times daily., Disp: , Rfl:    simvastatin (ZOCOR) 20 MG tablet, TAKE 1 TABLET BY MOUTH AT BEDTIME, Disp: 90 tablet, Rfl: 2   sulfamethoxazole-trimethoprim (BACTRIM DS) 800-160 MG tablet, Take 1 tablet by mouth 2 (two) times daily., Disp: , Rfl:    TURMERIC CURCUMIN PO, Take 1,500 mg by mouth 2 (two) times daily., Disp: , Rfl:    varenicline (CHANTIX) 0.5 MG tablet, Take 0.5 mg by mouth daily., Disp: , Rfl:    vitamin B-12 (CYANOCOBALAMIN) 500 MCG tablet, Take 500 mcg by mouth daily., Disp: , Rfl:    vitamin E 180 MG (400 UNITS) capsule, Take 400 Units by mouth daily., Disp: , Rfl:    albuterol (VENTOLIN HFA) 108 (90 Base) MCG/ACT inhaler, INHALE 2 PUFFS BY MOUTH EVERY 6 HOURS AS NEEDED FOR WHEEZING (Patient not taking: Reported on 10/01/2023), Disp: 9 g, Rfl: 0   ASSESSMENT AND PLAN: .      ICD-10-CM   1. Coronary artery disease of native artery of native heart with stable angina pectoris (HCC)  I25.118     2. PVD (peripheral vascular disease) (HCC)  I73.9     3. Primary hypertension  I10     4. Pure hypercholesterolemia  E78.00       1. Coronary artery disease of native artery of native heart with stable angina pectoris [facility]) Patient without recurrence of angina pectoris, he is presently on appropriate medical therapy and on bisoprolol 5 mg 1/2  tablet daily, losartan 25 mg daily with excellent control of blood pressure along with Ranexa 500 mg 1/2 tablet in the evening.   2. PVD (peripheral vascular disease) (HCC) He has had complete resolution of wound on his left leg, he is presently on Xarelto 2.5 mg twice daily and aspirin 81 mg daily, in view of critical limb ischemia, continue the same for now.  3. Primary hypertension Blood pressure is well-controlled on low-dose of beta-blocker therapy and ARB, he does have stage III chronic kidney disease, continue the same.  4. Pure hypercholesterolemia Lipids under excellent control finally his diabetes is also well-controlled with A1c 6.3% as of 07/16/2023.  His lipids on 09/08/2022 revealed total cholesterol of 72, triglycerides 63, HDL 26, LDL 33.  Presently on simvastatin 20 mg daily.  Continue same.  Office visit in a year.  Signed,  Yates Decamp, MD, [facility] 10/01/2023, 9:36 AM [facility] [address] [place], [place] [address] Phone: [phone]. Fax:  [phone]

## 2023-10-01 NOTE — Patient Instructions (Signed)

## 2023-10-27 ENCOUNTER — Other Ambulatory Visit: Payer: Self-pay | Admitting: Family

## 2023-11-03 ENCOUNTER — Ambulatory Visit: Payer: 59 | Admitting: Podiatry

## 2023-11-10 ENCOUNTER — Ambulatory Visit: Admitting: Podiatry

## 2023-11-10 DIAGNOSIS — B351 Tinea unguium: Secondary | ICD-10-CM | POA: Diagnosis not present

## 2023-11-10 DIAGNOSIS — E1042 Type 1 diabetes mellitus with diabetic polyneuropathy: Secondary | ICD-10-CM

## 2023-11-10 DIAGNOSIS — M79676 Pain in unspecified toe(s): Secondary | ICD-10-CM | POA: Diagnosis not present

## 2023-11-10 NOTE — Progress Notes (Signed)
 He presents today for follow-up of his diabetic foot.  He states that he seems to be doing much better current A1c is at 6.3% he takes his 81 mg aspirin daily and reports that he has started walking 1.5 miles 3 times a week.  He states that things seem to be getting lighter.  Objective: Vital signs are stable alert and oriented x 3.  Pulses are palpable.  Digital hair growth is noted.  Toenails are long thick yellow dystrophic clinically mycotic left over right.  No open lesions or wounds.  All wounds gone on to heal.  Assessment: Diabetes mellitus with diabetic peripheral neuropathy.  Pain in limb secondary to onychomycosis.  Plan: Discussed etiology pathology conservative versus surgical therapies.  Debrided toenails 1 through 5 bilateral.

## 2023-11-30 ENCOUNTER — Encounter: Payer: Self-pay | Admitting: Cardiology

## 2023-11-30 DIAGNOSIS — I10 Essential (primary) hypertension: Secondary | ICD-10-CM

## 2023-11-30 MED ORDER — BISOPROLOL FUMARATE 5 MG PO TABS: 5.0000 mg | ORAL_TABLET | Freq: Every day | ORAL | 3 refills | Status: DC

## 2023-11-30 NOTE — Telephone Encounter (Signed)
 I discussed with the patient, suspect his elevated blood pressure is related to recent upper respiratory infection and is being managed by his PCP as well, advised him to continue to follow-up with them however I have increased his bisoprolol  dose from 5 mg 1/2 tablet daily to 5 mg daily.

## 2023-11-30 NOTE — Addendum Note (Signed)
 Addended by: Federico Hopkins on: 11/30/2023 01:07 PM   Modules accepted: Orders

## 2023-12-30 ENCOUNTER — Other Ambulatory Visit: Payer: Self-pay | Admitting: Cardiology

## 2024-02-16 ENCOUNTER — Encounter: Payer: Self-pay | Admitting: Podiatry

## 2024-02-16 ENCOUNTER — Ambulatory Visit: Admitting: Podiatry

## 2024-02-16 DIAGNOSIS — E1042 Type 1 diabetes mellitus with diabetic polyneuropathy: Secondary | ICD-10-CM | POA: Diagnosis not present

## 2024-02-16 DIAGNOSIS — M79676 Pain in unspecified toe(s): Secondary | ICD-10-CM

## 2024-02-16 DIAGNOSIS — B351 Tinea unguium: Secondary | ICD-10-CM | POA: Diagnosis not present

## 2024-02-16 NOTE — Progress Notes (Signed)
 He presents today chief concern of painful elongated toenails and diabetes states that he has stopped walking quite as much and has now switched to a bicycle.  Objective: Pulses are palpable right barely palpable left capillary fill time is immediate digital hair is still present.  No open lesions or wounds.  Toenails are long thick yellow dystrophic clinically mycotic.  They are painful on palpation.  Assessment: Diabetes mellitus diabetic peripheral neuropathy type 1 diabetes pain limb secondary to onychomycosis.  Plan: Debridement of toenails 1 through 5 bilateral.  Follow-up with him in 3 months

## 2024-03-05 ENCOUNTER — Other Ambulatory Visit: Payer: Self-pay | Admitting: Cardiology

## 2024-03-15 ENCOUNTER — Telehealth: Payer: Self-pay | Admitting: Cardiology

## 2024-03-15 NOTE — Telephone Encounter (Signed)
*  STAT* If patient is at the pharmacy, call can be transferred to refill team.   1. Which medications need to be refilled? (please list name of each medication and dose if known) bisoprolol  (ZEBETA ) 5 MG tablet    2. Would you like to learn more about the convenience, safety, & potential cost savings by using the [facility]?     3. Are you open to using the [facility] (Type [facility]. ).   4. Which pharmacy/location (including street and city if local pharmacy) is medication to be sent to? [facility] [address] [place], [place] - [address]    5. Do they need a 30 day or 90 day supply? 90 day

## 2024-03-16 ENCOUNTER — Other Ambulatory Visit (HOSPITAL_BASED_OUTPATIENT_CLINIC_OR_DEPARTMENT_OTHER): Payer: Self-pay

## 2024-03-16 ENCOUNTER — Other Ambulatory Visit: Payer: Self-pay | Admitting: Cardiology

## 2024-03-16 DIAGNOSIS — I10 Essential (primary) hypertension: Secondary | ICD-10-CM

## 2024-03-16 MED ORDER — BISOPROLOL FUMARATE 5 MG PO TABS
5.0000 mg | ORAL_TABLET | Freq: Every day | ORAL | 2 refills | Status: DC
  Filled 2024-03-16 (×2): qty 30, 30d supply, fill #0

## 2024-03-16 NOTE — Telephone Encounter (Signed)
 Pt called back, please send to [facility] pharmacy    bisoprolol  (ZEBETA ) 5 MG tablet Take 1 tablet (5 mg total) by mouth daily.

## 2024-03-17 ENCOUNTER — Other Ambulatory Visit: Payer: Self-pay | Admitting: Cardiology

## 2024-03-17 DIAGNOSIS — I10 Essential (primary) hypertension: Secondary | ICD-10-CM

## 2024-03-18 ENCOUNTER — Encounter: Payer: Self-pay | Admitting: Cardiology

## 2024-03-18 ENCOUNTER — Other Ambulatory Visit (HOSPITAL_BASED_OUTPATIENT_CLINIC_OR_DEPARTMENT_OTHER): Payer: Self-pay

## 2024-03-18 ENCOUNTER — Other Ambulatory Visit: Payer: Self-pay

## 2024-03-18 DIAGNOSIS — I10 Essential (primary) hypertension: Secondary | ICD-10-CM

## 2024-03-18 MED ORDER — BISOPROLOL FUMARATE 5 MG PO TABS: 5.0000 mg | ORAL_TABLET | Freq: Every day | ORAL | 2 refills | Status: AC

## 2024-04-06 ENCOUNTER — Other Ambulatory Visit: Payer: Self-pay | Admitting: Cardiology

## 2024-05-19 ENCOUNTER — Other Ambulatory Visit: Payer: Self-pay

## 2024-05-19 ENCOUNTER — Ambulatory Visit: Admitting: Podiatry

## 2024-05-19 DIAGNOSIS — M79676 Pain in unspecified toe(s): Secondary | ICD-10-CM | POA: Diagnosis not present

## 2024-05-19 DIAGNOSIS — I739 Peripheral vascular disease, unspecified: Secondary | ICD-10-CM | POA: Diagnosis not present

## 2024-05-19 DIAGNOSIS — E1042 Type 1 diabetes mellitus with diabetic polyneuropathy: Secondary | ICD-10-CM | POA: Diagnosis not present

## 2024-05-19 DIAGNOSIS — B351 Tinea unguium: Secondary | ICD-10-CM | POA: Diagnosis not present

## 2024-05-19 NOTE — Progress Notes (Signed)
 [name] presents today for a follow-up of his peripheral vascular disease and his painful nails.  States that he is doing pretty well has had an abrasion that is healing well along the lateral aspect of his hallux left.  Objective: Vital signs are stable alert and oriented x 3 pulses are unchanged barely palpable but does have good capillary refill refill time.  Toenails are long thick yellow dystrophic clinically mycotic no open lesions or wounds.  Assessment: Pain in limb secondary to onychomycosis diabetic peripheral neuropathy and vascular disease.  Plan: Debridement of nails 1 through 5 bilateral follow-up with him in 3 months remember to ask how his trip to [name] when he went.

## 2024-05-26 ENCOUNTER — Other Ambulatory Visit: Payer: Self-pay | Admitting: Cardiology

## 2024-06-06 ENCOUNTER — Encounter: Payer: Self-pay | Admitting: Radiology

## 2024-08-23 ENCOUNTER — Ambulatory Visit: Admitting: Podiatry

## 2024-08-23 DIAGNOSIS — Z961 Presence of intraocular lens: Secondary | ICD-10-CM | POA: Insufficient documentation

## 2024-09-08 ENCOUNTER — Ambulatory Visit: Admitting: Podiatry

## 2024-09-08 ENCOUNTER — Encounter: Payer: Self-pay | Admitting: Podiatry

## 2024-09-08 DIAGNOSIS — B351 Tinea unguium: Secondary | ICD-10-CM

## 2024-09-08 DIAGNOSIS — E1042 Type 1 diabetes mellitus with diabetic polyneuropathy: Secondary | ICD-10-CM

## 2024-09-08 DIAGNOSIS — K921 Melena: Secondary | ICD-10-CM | POA: Insufficient documentation

## 2024-09-08 DIAGNOSIS — Z800 Family history of malignant neoplasm of digestive organs: Secondary | ICD-10-CM | POA: Insufficient documentation

## 2024-09-08 DIAGNOSIS — K5730 Diverticulosis of large intestine without perforation or abscess without bleeding: Secondary | ICD-10-CM | POA: Insufficient documentation

## 2024-09-08 DIAGNOSIS — R140 Abdominal distension (gaseous): Secondary | ICD-10-CM | POA: Insufficient documentation

## 2024-09-08 DIAGNOSIS — G9332 Myalgic encephalomyelitis/chronic fatigue syndrome: Secondary | ICD-10-CM | POA: Insufficient documentation

## 2024-09-08 DIAGNOSIS — Z789 Other specified health status: Secondary | ICD-10-CM | POA: Insufficient documentation

## 2024-09-08 DIAGNOSIS — G589 Mononeuropathy, unspecified: Secondary | ICD-10-CM | POA: Insufficient documentation

## 2024-09-08 DIAGNOSIS — R635 Abnormal weight gain: Secondary | ICD-10-CM | POA: Insufficient documentation

## 2024-09-08 DIAGNOSIS — S60419A Abrasion of unspecified finger, initial encounter: Secondary | ICD-10-CM | POA: Insufficient documentation

## 2024-09-08 DIAGNOSIS — R194 Change in bowel habit: Secondary | ICD-10-CM | POA: Insufficient documentation

## 2024-09-08 DIAGNOSIS — I739 Peripheral vascular disease, unspecified: Secondary | ICD-10-CM

## 2024-09-08 NOTE — Progress Notes (Signed)
 Mr. Bevis presents today for a follow-up of his peripheral vascular disease and his painful nails.  States that he is doing pretty well has had an abrasion that is healing well along the medial aspect of his left ankle and the dorsal aspect of his right foot.   Objective: Vital signs are stable alert and oriented x 3 pulses are unchanged barely palpable but does have good capillary refill refill time.  Toenails are long thick yellow dystrophic clinically mycotic no open lesions or wounds.  Assessment: Pain in limb secondary to onychomycosis diabetic peripheral neuropathy and vascular disease.  Plan: Debrided toenails 1 through 5 bilaterally.  Tolerated procedure well.  Remember to ask how the situation with his granddaughter is going.

## 2024-12-06 ENCOUNTER — Ambulatory Visit: Admitting: Podiatry
